# Patient Record
Sex: Female | Born: 1970 | Race: Black or African American | Hispanic: No | Smoking: Current every day smoker
Health system: Southern US, Community
[De-identification: ages and names within clinical notes are randomized; demographics above are authoritative.]

## PROBLEM LIST (undated history)

## (undated) ENCOUNTER — Emergency Department: Payer: Self-pay

## (undated) ENCOUNTER — Emergency Department (HOSPITAL_COMMUNITY): Payer: MEDICAID

## (undated) DIAGNOSIS — I10 Essential (primary) hypertension: Secondary | ICD-10-CM

## (undated) DIAGNOSIS — J45909 Unspecified asthma, uncomplicated: Secondary | ICD-10-CM

## (undated) DIAGNOSIS — F419 Anxiety disorder, unspecified: Secondary | ICD-10-CM

## (undated) DIAGNOSIS — J449 Chronic obstructive pulmonary disease, unspecified: Secondary | ICD-10-CM

## (undated) DIAGNOSIS — F32A Depression, unspecified: Secondary | ICD-10-CM

## (undated) DIAGNOSIS — M419 Scoliosis, unspecified: Secondary | ICD-10-CM

## (undated) HISTORY — PX: ECTOPIC PREGNANCY SURGERY: SHX613

## (undated) HISTORY — DX: Depression, unspecified: F32.A

## (undated) HISTORY — DX: Anxiety disorder, unspecified: F41.9

## (undated) HISTORY — DX: Essential (primary) hypertension: I10

## (undated) HISTORY — PX: BACK SURGERY: SHX140

---

## 1999-04-20 ENCOUNTER — Emergency Department (HOSPITAL_COMMUNITY): Admission: EM | Admit: 1999-04-20 | Discharge: 1999-04-20 | Payer: Self-pay | Admitting: Emergency Medicine

## 2000-10-06 ENCOUNTER — Inpatient Hospital Stay (HOSPITAL_COMMUNITY): Admission: EM | Admit: 2000-10-06 | Discharge: 2000-10-07 | Payer: Self-pay

## 2001-01-08 ENCOUNTER — Emergency Department (HOSPITAL_COMMUNITY): Admission: EM | Admit: 2001-01-08 | Discharge: 2001-01-08 | Payer: Self-pay | Admitting: Emergency Medicine

## 2001-01-08 ENCOUNTER — Encounter: Payer: Self-pay | Admitting: Emergency Medicine

## 2011-05-10 ENCOUNTER — Emergency Department (HOSPITAL_COMMUNITY): Payer: Medicaid - Out of State

## 2011-05-10 ENCOUNTER — Emergency Department (HOSPITAL_COMMUNITY)
Admission: EM | Admit: 2011-05-10 | Discharge: 2011-05-10 | Disposition: A | Discharge status: Home / Self Care | Payer: Medicaid - Out of State | Attending: Emergency Medicine | Admitting: Emergency Medicine

## 2011-05-10 ENCOUNTER — Encounter (HOSPITAL_COMMUNITY): Payer: Self-pay | Admitting: *Deleted

## 2011-05-10 DIAGNOSIS — M79609 Pain in unspecified limb: Secondary | ICD-10-CM | POA: Insufficient documentation

## 2011-05-10 DIAGNOSIS — F17200 Nicotine dependence, unspecified, uncomplicated: Secondary | ICD-10-CM | POA: Insufficient documentation

## 2011-05-10 DIAGNOSIS — M549 Dorsalgia, unspecified: Secondary | ICD-10-CM

## 2011-05-10 DIAGNOSIS — M545 Low back pain, unspecified: Secondary | ICD-10-CM | POA: Insufficient documentation

## 2011-05-10 DIAGNOSIS — R209 Unspecified disturbances of skin sensation: Secondary | ICD-10-CM | POA: Insufficient documentation

## 2011-05-10 HISTORY — DX: Scoliosis, unspecified: M41.9

## 2011-05-10 MED ORDER — IBUPROFEN 600 MG PO TABS: 600.0000 mg | ORAL_TABLET | Freq: Three times a day (TID) | ORAL | Status: AC | PRN

## 2011-05-10 MED ORDER — KETOROLAC TROMETHAMINE 60 MG/2ML IM SOLN
60.0000 mg | Freq: Once | INTRAMUSCULAR | Status: AC
  Administered 2011-05-10: 60 mg via INTRAMUSCULAR
  Filled 2011-05-10: qty 2

## 2011-05-10 MED ORDER — HYDROMORPHONE HCL PF 2 MG/ML IJ SOLN
2.0000 mg | Freq: Once | INTRAMUSCULAR | Status: AC
  Administered 2011-05-10: 2 mg via INTRAMUSCULAR
  Filled 2011-05-10: qty 1

## 2011-05-10 MED ORDER — OXYCODONE-ACETAMINOPHEN 5-325 MG PO TABS: 1.0000 | ORAL_TABLET | ORAL | Status: AC | PRN

## 2011-05-10 NOTE — ED Notes (Signed)
To ED for eval of lower back pain with radiation down right leg since falling when getting out of the shower on Friday. Ambulatory into triage.

## 2011-05-10 NOTE — Discharge Instructions (Signed)
Back Pain, Adult Low back pain is very common. About 1 in 5 people have back pain.The cause of low back pain is rarely dangerous. The pain often gets better over time.About half of people with a sudden onset of back pain feel better in just 2 weeks. About 8 in 10 people feel better by 6 weeks.  CAUSES Some common causes of back pain include:  Strain of the muscles or ligaments supporting the spine.   Wear and tear (degeneration) of the spinal discs.   Arthritis.   Direct injury to the back.  DIAGNOSIS Most of the time, the direct cause of low back pain is not known.However, back pain can be treated effectively even when the exact cause of the pain is unknown.Answering your caregiver's questions about your overall health and symptoms is one of the most accurate ways to make sure the cause of your pain is not dangerous. If your caregiver needs more information, he or she may order lab work or imaging tests (X-rays or MRIs).However, even if imaging tests show changes in your back, this usually does not require surgery. HOME CARE INSTRUCTIONS For many people, back pain returns.Since low back pain is rarely dangerous, it is often a condition that people can learn to manageon their own.   Remain active. It is stressful on the back to sit or stand in one place. Do not sit, drive, or stand in one place for more than 30 minutes at a time. Take short walks on level surfaces as soon as pain allows.Try to increase the length of time you walk each day.   Do not stay in bed.Resting more than 1 or 2 days can delay your recovery.   Do not avoid exercise or work.Your body is made to move.It is not dangerous to be active, even though your back may hurt.Your back will likely heal faster if you return to being active before your pain is gone.   Pay attention to your body when you bend and lift. Many people have less discomfortwhen lifting if they bend their knees, keep the load close to their  bodies,and avoid twisting. Often, the most comfortable positions are those that put less stress on your recovering back.   Find a comfortable position to sleep. Use a firm mattress and lie on your side with your knees slightly bent. If you lie on your back, put a pillow under your knees.   Only take over-the-counter or prescription medicines as directed by your caregiver. Over-the-counter medicines to reduce pain and inflammation are often the most helpful.Your caregiver may prescribe muscle relaxant drugs.These medicines help dull your pain so you can more quickly return to your normal activities and healthy exercise.   Put ice on the injured area.   Put ice in a plastic bag.   Place a towel between your skin and the bag.   Leave the ice on for 15 to 20 minutes, 3 to 4 times a day for the first 2 to 3 days. After that, ice and heat may be alternated to reduce pain and spasms.   Ask your caregiver about trying back exercises and gentle massage. This may be of some benefit.   Avoid feeling anxious or stressed.Stress increases muscle tension and can worsen back pain.It is important to recognize when you are anxious or stressed and learn ways to manage it.Exercise is a great option.  SEEK MEDICAL CARE IF:  You have pain that is not relieved with rest or medicine.   You have   pain that does not improve in 1 week.   You have new symptoms.   You are generally not feeling well.  SEEK IMMEDIATE MEDICAL CARE IF:   You have pain that radiates from your back into your legs.   You develop new bowel or bladder control problems.   You have unusual weakness or numbness in your arms or legs.   You develop nausea or vomiting.   You develop abdominal pain.   You feel faint.  Document Released: 01/04/2005 Document Revised: 12/24/2010 Document Reviewed: 05/25/2010 [employer] Patient Information 2012 [employer]. 

## 2011-05-10 NOTE — ED Provider Notes (Signed)
History     CSN: [id number]  Arrival date & time 05/10/11  1259   First MD Initiated Contact with Patient 05/10/11 1332      Chief Complaint  Patient presents with  . Back Pain     The history is provided by the patient.   the patient reports a history of ongoing low back pain.  This has worsened recently by a fall in the shower 3 days ago.  She reports she now has pain radiating down her right buttock.  She denies weakness of her lower extremities.  She's had no difficulty urinating or having bowel movements.  She denies peroneal numbness.  She reports a history of back surgery and fusion in the past.  She has a history of scoliosis.  She denies numbness and tingling.  She reports over the counter pain medications have not been helping.  Her pain is moderate to severe at this time.  Her pain is worsened by movement.  She is able to walk without significant difficulty.  She was ambulatory in the triage area  Past Medical History  Diagnosis Date  . Scoliosis     Past Surgical History  Procedure Date  . Back surgery     No family history on file.  History  Substance Use Topics  . Smoking status: Current Everyday Smoker    Types: Cigarettes  . Smokeless tobacco: Not on file  . Alcohol Use: Yes    OB History    Grav Para Term Preterm Abortions TAB SAB Ect Mult Living                  Review of Systems  All other systems reviewed and are negative.    Allergies  Review of patient's allergies indicates no known allergies.  Home Medications   Current Outpatient Rx  Name Route Sig Dispense Refill  . IBUPROFEN 600 MG PO TABS Oral Take 1 tablet (600 mg total) by mouth every 8 (eight) hours as needed for pain. 15 tablet 0  . OXYCODONE-ACETAMINOPHEN 5-325 MG PO TABS Oral Take 1 tablet by mouth every 4 (four) hours as needed for pain. 15 tablet 0    BP 136/103  Pulse 96  Temp 98.4 F (36.9 C)  Resp 16  SpO2 96%  LMP 04/21/2011  Physical Exam  Nursing note and  vitals reviewed. Constitutional: She is oriented to person, place, and time. She appears well-developed and well-nourished. No distress.  HENT:  Head: Normocephalic and atraumatic.  Eyes: EOM are normal.  Neck: Normal range of motion.  Cardiovascular: Normal rate, regular rhythm and normal heart sounds.   Pulmonary/Chest: Effort normal and breath sounds normal.  Abdominal: Soft. She exhibits no distension. There is no tenderness.  Musculoskeletal: Normal range of motion.  Neurological: She is alert and oriented to person, place, and time.       5/5 strength in major muscle groups of bilateral lower extremities.   Skin: Skin is warm and dry.  Psychiatric: She has a normal mood and affect. Judgment normal.    ED Course  Procedures (including critical care time)  Labs Reviewed - No data to display Dg Lumbar Spine Complete  05/10/2011  *RADIOLOGY REPORT*  Clinical Data: Low back pain and bilateral leg pain and numbness, right greater than left, since a fall 3 days ago.  LUMBAR SPINE - COMPLETE 4+ VIEW  Comparison: None.  Findings: The patient appears to have had posterior fusion from T9- 10 through L3-4.  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  The patient has severe arthritic changes of the facet joints at L4- 5 and L5 S1 and between the spinous processes of L4-L5.  There is a grade 1 spondylolisthesis of L5 on S1 with disc space narrowing.  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  No acute abnormality.  IMPRESSION: No acute abnormalities.  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  Original Report Authenticated By: Gwynn Burly, M.D.     1. Back pain       MDM  Normal lower extremity neurologic exam. No bowel or bladder complaints. No back pain red flags. Likely musculoskeletal back pain. Doubt spinal epidural abscess. Doubt cauda equina. Doubt abdominal aortic aneurysm  Follow up with the [facility] recommended given her pain. No  indication for MRI imaging today        Lyanne Co, MD 05/10/11 1525

## 2011-07-20 ENCOUNTER — Ambulatory Visit: Payer: No Typology Code available for payment source | Attending: Family Medicine | Admitting: Rehabilitation

## 2011-08-16 ENCOUNTER — Encounter (HOSPITAL_COMMUNITY): Payer: Self-pay | Admitting: Emergency Medicine

## 2011-08-16 ENCOUNTER — Emergency Department (HOSPITAL_COMMUNITY): Payer: Medicaid - Out of State

## 2011-08-16 ENCOUNTER — Emergency Department (HOSPITAL_COMMUNITY)
Admission: EM | Admit: 2011-08-16 | Discharge: 2011-08-16 | Disposition: A | Discharge status: Home / Self Care | Payer: Medicaid - Out of State | Attending: Emergency Medicine | Admitting: Emergency Medicine

## 2011-08-16 DIAGNOSIS — J40 Bronchitis, not specified as acute or chronic: Secondary | ICD-10-CM

## 2011-08-16 DIAGNOSIS — F17200 Nicotine dependence, unspecified, uncomplicated: Secondary | ICD-10-CM | POA: Insufficient documentation

## 2011-08-16 DIAGNOSIS — M549 Dorsalgia, unspecified: Secondary | ICD-10-CM

## 2011-08-16 MED ORDER — OXYCODONE-ACETAMINOPHEN 5-325 MG PO TABS
1.0000 | ORAL_TABLET | Freq: Once | ORAL | Status: AC
  Administered 2011-08-16: 1 via ORAL
  Filled 2011-08-16 (×2): qty 1

## 2011-08-16 MED ORDER — AMOXICILLIN 500 MG PO CAPS: 500.0000 mg | ORAL_CAPSULE | Freq: Three times a day (TID) | ORAL | Status: AC

## 2011-08-16 MED ORDER — OXYCODONE-ACETAMINOPHEN 5-325 MG PO TABS: 1.0000 | ORAL_TABLET | Freq: Four times a day (QID) | ORAL | Status: AC | PRN

## 2011-08-16 NOTE — ED Provider Notes (Cosign Needed)
History   This chart was scribed for Rachel Lennert, MD by Sofie Rower. The patient was seen in room TR05C/TR05C and the patient's care was started at 5:13 PM     CSN: [id number]  Arrival date & time 08/16/11  1525   None     Chief Complaint  Patient presents with  . Back Pain    (Consider location/radiation/quality/duration/timing/severity/associated sxs/prior treatment) Patient is a 41 y.o. female presenting with back pain. The history is provided by the patient. No language interpreter was used.  Back Pain  This is a new problem. The problem occurs constantly. The pain is moderate. The symptoms are aggravated by certain positions. The pain is the same all the time. Pertinent negatives include no chest pain and no fever. She has tried nothing for the symptoms. The treatment provided no relief.    Past Medical History  Diagnosis Date  . Scoliosis     Past Surgical History  Procedure Date  . Back surgery     No family history on file.  History  Substance Use Topics  . Smoking status: Current Everyday Smoker    Types: Cigarettes  . Smokeless tobacco: Not on file  . Alcohol Use: Yes    OB History    Grav Para Term Preterm Abortions TAB SAB Ect Mult Living                  Review of Systems  Constitutional: Negative for fever.  Cardiovascular: Negative for chest pain.  Musculoskeletal: Positive for back pain.  All other systems reviewed and are negative.    Allergies  Review of patient's allergies indicates no known allergies.  Home Medications   Current Outpatient Rx  Name Route Sig Dispense Refill  . ACETAMINOPHEN 500 MG PO TABS Oral Take 1,000 mg by mouth every 4 (four) hours as needed. For back pain      BP 142/103  Pulse 79  Temp 98.8 F (37.1 C)  Resp 16  SpO2 100%  LMP 08/06/2011  Physical Exam  Nursing note and vitals reviewed. Constitutional: She is oriented to person, place, and time. She appears well-developed.  HENT:  Head:  Normocephalic.  Eyes: Conjunctivae are normal.  Neck: No tracheal deviation present.  Cardiovascular:  No murmur heard. Musculoskeletal: Normal range of motion.       Well healed incision to the thoracic/ lumbar spine, lumbar tenderness, pain radiating down the right leg.   Neurological: She is oriented to person, place, and time.  Skin: Skin is warm.  Psychiatric: She has a normal mood and affect.    ED Course  Procedures (including critical care time)  DIAGNOSTIC STUDIES: Oxygen Saturation is 100% on room air, normal by my interpretation.    COORDINATION OF CARE:  5:16PM- EDP at bedside discusses treatment plan concerning x-ray of back.   6:14PM- EDP at bedside discusses x-ray results and follow up with PCP.     Labs Reviewed - No data to display No results found for this or any previous visit. Dg Chest 2 View  08/16/2011  *RADIOLOGY REPORT*  Clinical Data: Back pain, history of asthma and smoking  CHEST - 2 VIEW  Comparison: Lumbar spine radiographs - 05/10/2011  Findings:  Normal cardiac silhouette and mediastinal contours.  No focal parenchymal opacities.  No definite pleural effusion or pneumothorax. Right-sided long-segment thoraco-lumbar paraspinal rod with associated mild to moderate residual scoliotic curvature, incompletely evaluated.  IMPRESSION: No acute cardiopulmonary disease.  Original Report Authenticated By: Alfredia Ferguson  V, M.D.   Dg Lumbar Spine Complete  08/16/2011  *RADIOLOGY REPORT*  Clinical Data: Back pain, no trauma, history of scoliosis  LUMBAR SPINE - COMPLETE 4+ VIEW  Comparison: 05/10/2011  Findings: Five views of the lumbar spine submitted.  Again noted prior fusion from T9- L4 vertebral body.  Again noted rotatory scoliosis of the spine.  Again noted disc space flattening at L5 S1 level.  Significant facet degenerative changes at L4-L5 and L5 S1 level again noted.  There is persistent grade 1 spondylolisthesis L5 on S1.  No acute fracture or  subluxation.  IMPRESSION: Stable postsurgical changes and degenerative changes as described above.  No acute fracture or subluxation.  Original Report Authenticated By: Natasha Mead, M.D.        No diagnosis found.    MDM       The chart was scribed for me under my direct supervision.  I personally performed the history, physical, and medical decision making and all procedures in the evaluation of this patient.Rachel Lennert, MD 08/16/11 [phone]

## 2011-08-16 NOTE — ED Notes (Signed)
Woke up w/ back pain this am and has had a bad cold for  aince last sat

## 2011-08-16 NOTE — ED Notes (Signed)
staes walks w/ walker due past hx of surgery on backl

## 2011-09-15 ENCOUNTER — Emergency Department (HOSPITAL_COMMUNITY)
Admission: EM | Admit: 2011-09-15 | Discharge: 2011-09-15 | Disposition: A | Discharge status: Home / Self Care | Payer: Self-pay | Attending: Emergency Medicine | Admitting: Emergency Medicine

## 2011-09-15 ENCOUNTER — Encounter (HOSPITAL_COMMUNITY): Payer: Self-pay | Admitting: Emergency Medicine

## 2011-09-15 ENCOUNTER — Emergency Department (HOSPITAL_COMMUNITY): Payer: Self-pay

## 2011-09-15 DIAGNOSIS — G8929 Other chronic pain: Secondary | ICD-10-CM

## 2011-09-15 DIAGNOSIS — M549 Dorsalgia, unspecified: Secondary | ICD-10-CM

## 2011-09-15 DIAGNOSIS — IMO0002 Reserved for concepts with insufficient information to code with codable children: Secondary | ICD-10-CM | POA: Insufficient documentation

## 2011-09-15 DIAGNOSIS — F17200 Nicotine dependence, unspecified, uncomplicated: Secondary | ICD-10-CM | POA: Insufficient documentation

## 2011-09-15 DIAGNOSIS — M4120 Other idiopathic scoliosis, site unspecified: Secondary | ICD-10-CM | POA: Insufficient documentation

## 2011-09-15 DIAGNOSIS — W2209XA Striking against other stationary object, initial encounter: Secondary | ICD-10-CM | POA: Insufficient documentation

## 2011-09-15 DIAGNOSIS — Y92009 Unspecified place in unspecified non-institutional (private) residence as the place of occurrence of the external cause: Secondary | ICD-10-CM | POA: Insufficient documentation

## 2011-09-15 MED ORDER — NAPROXEN 375 MG PO TABS: 375.0000 mg | ORAL_TABLET | Freq: Two times a day (BID) | ORAL | Status: AC

## 2011-09-15 MED ORDER — CYCLOBENZAPRINE HCL 10 MG PO TABS: 10.0000 mg | ORAL_TABLET | Freq: Two times a day (BID) | ORAL | Status: AC | PRN

## 2011-09-15 MED ORDER — KETOROLAC TROMETHAMINE 60 MG/2ML IM SOLN
60.0000 mg | Freq: Once | INTRAMUSCULAR | Status: AC
  Administered 2011-09-15: 60 mg via INTRAMUSCULAR
  Filled 2011-09-15: qty 2

## 2011-09-15 MED ORDER — NAPROXEN 375 MG PO TABS: 375.0000 mg | ORAL_TABLET | Freq: Two times a day (BID) | ORAL | Status: DC

## 2011-09-15 MED ORDER — CYCLOBENZAPRINE HCL 10 MG PO TABS: 10.0000 mg | ORAL_TABLET | Freq: Two times a day (BID) | ORAL | Status: DC | PRN

## 2011-09-15 NOTE — ED Notes (Signed)
Was in br slipped on rug went down and hit  Her back on tub has  Spine surgery she staes

## 2011-09-15 NOTE — ED Notes (Signed)
Patient transported to X-ray 

## 2011-09-15 NOTE — ED Provider Notes (Signed)
History     CSN: [id number]  Arrival date & time 09/15/11  0806   First MD Initiated Contact with Patient 09/15/11 0818      Chief Complaint  Patient presents with  . Back Pain    (Consider location/radiation/quality/duration/timing/severity/associated sxs/prior treatment) HPI  41 year old female with history of scoliosis and history of back surgery presents complaining of back pain. Patient reports she was in the bathroom when she slipped on a rug and hits her back against the tub.  Denies hitting her head or loss of consciousness. Patient complaining of sharp pain to her low back which radiates down to her right head. She reports having similar pain in the past. Pain worsened with movement. She has not tried anything to alleviate her pain.  Denies urinary/bowel incontinence or saddle anesthesia.  Sts she uses a walker to walk on a regular basis due to having weakness of her legs and back pain.  Denies any precipitating sxs prior to fall.   Past Medical History  Diagnosis Date  . Scoliosis     Past Surgical History  Procedure Date  . Back surgery     No family history on file.  History  Substance Use Topics  . Smoking status: Current Everyday Smoker    Types: Cigarettes  . Smokeless tobacco: Not on file  . Alcohol Use: Yes    OB History    Grav Para Term Preterm Abortions TAB SAB Ect Mult Living                  Review of Systems  All other systems reviewed and are negative.    Allergies  Review of patient's allergies indicates no known allergies.  Home Medications   Current Outpatient Rx  Name Route Sig Dispense Refill  . ACETAMINOPHEN 500 MG PO TABS Oral Take 1,000 mg by mouth every 4 (four) hours as needed. For back pain      BP 141/115  Pulse 105  Temp 98.2 F (36.8 C) (Oral)  Resp 20  SpO2 98%  Physical Exam  Nursing note and vitals reviewed. Constitutional: She is oriented to person, place, and time. She appears well-developed and  well-nourished. No distress.  HENT:  Head: Normocephalic and atraumatic.  Eyes: Conjunctivae are normal.  Neck: Normal range of motion. Neck supple.  Abdominal: Soft. There is no tenderness.  Musculoskeletal:       Well healing surgical to Lumbar region, ttp, no step off, no deformity noted.  Decrease R hip flexion and extension due to pain.  Normal knee flexion, patella DTR 2+ bilat, no foot drops, sensation intact, normal strength to lower extremities.  Walk with a limp.   Neurological: She is alert and oriented to person, place, and time.  Skin: Skin is warm. No rash noted.  Psychiatric: She has a normal mood and affect.    ED Course  Procedures (including critical care time)  Labs Reviewed - No data to display No results found.   No diagnosis found.  No results found for this or any previous visit. Dg Chest 2 View  08/16/2011  *RADIOLOGY REPORT*  Clinical Data: Back pain, history of asthma and smoking  CHEST - 2 VIEW  Comparison: Lumbar spine radiographs - 05/10/2011  Findings:  Normal cardiac silhouette and mediastinal contours.  No focal parenchymal opacities.  No definite pleural effusion or pneumothorax. Right-sided long-segment thoraco-lumbar paraspinal rod with associated mild to moderate residual scoliotic curvature, incompletely evaluated.  IMPRESSION: No acute cardiopulmonary disease.  Original Report  Authenticated By: Waynard Reeds, M.D.   Dg Lumbar Spine Complete  09/15/2011  *RADIOLOGY REPORT*  Clinical Data: Fall, back pain.  Scoliosis  LUMBAR SPINE - COMPLETE 4+ VIEW  Comparison: Plain films 08/16/2011  Findings: Spinal fusion rod is noted and unchanged.  Rotatory scoliosis is similar.  There is no acute loss of vertebral body height or disc height.  Disc space widening and L4-L5 is unchanged. Disc space narrowing and mild into stasis and L5-S1 is unchanged. 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  IMPRESSION:  1.  No acute findings lumbar spine. 2.  Posterior spinal fusion  with rotatory scoliosis and degenerative change.   Original Report Authenticated By: Genevive Bi, M.D.    Dg Lumbar Spine Complete  08/16/2011  *RADIOLOGY REPORT*  Clinical Data: Back pain, no trauma, history of scoliosis  LUMBAR SPINE - COMPLETE 4+ VIEW  Comparison: 05/10/2011  Findings: Five views of the lumbar spine submitted.  Again noted prior fusion from T9- L4 vertebral body.  Again noted rotatory scoliosis of the spine.  Again noted disc space flattening at L5 S1 level.  Significant facet degenerative changes at L4-L5 and L5 S1 level again noted.  There is persistent grade 1 spondylolisthesis L5 on S1.  No acute fracture or subluxation.  IMPRESSION: Stable postsurgical changes and degenerative changes as described above.  No acute fracture or subluxation.  Original Report Authenticated By: Natasha Mead, M.D.    1. Lower back injury from fall  MDM  Mechanical fall with injury to lower back.  Xray ordered, pain mediation given. No red flags.      10:09 AM X-rays review by me reveals no acute fractures or dislocation. Reassurance given. Will refer to orthopedics for further management. Patient able to ambulate, has walker at home.   Vital signs and medical records were reviewed and considered.  Labs and imaging were reviewed by me.    Fayrene Helper, PA-C 09/15/11 1022  Fayrene Helper, PA-C 09/15/11 1022

## 2011-09-19 NOTE — ED Provider Notes (Signed)
Medical screening examination/treatment/procedure(s) were performed by non-physician practitioner and as supervising physician I was immediately available for consultation/collaboration.  Cheri Guppy, MD 09/19/11 [phone]

## 2014-05-23 ENCOUNTER — Emergency Department (HOSPITAL_COMMUNITY): Payer: Medicaid - Out of State

## 2014-05-23 ENCOUNTER — Emergency Department (HOSPITAL_COMMUNITY)
Admission: EM | Admit: 2014-05-23 | Discharge: 2014-05-23 | Disposition: A | Discharge status: Home / Self Care | Payer: Medicaid - Out of State | Attending: Emergency Medicine | Admitting: Emergency Medicine

## 2014-05-23 ENCOUNTER — Encounter (HOSPITAL_COMMUNITY): Payer: Self-pay | Admitting: Emergency Medicine

## 2014-05-23 DIAGNOSIS — R5383 Other fatigue: Secondary | ICD-10-CM | POA: Insufficient documentation

## 2014-05-23 DIAGNOSIS — M79601 Pain in right arm: Secondary | ICD-10-CM | POA: Insufficient documentation

## 2014-05-23 DIAGNOSIS — R200 Anesthesia of skin: Secondary | ICD-10-CM | POA: Insufficient documentation

## 2014-05-23 DIAGNOSIS — J45901 Unspecified asthma with (acute) exacerbation: Secondary | ICD-10-CM | POA: Insufficient documentation

## 2014-05-23 DIAGNOSIS — M25511 Pain in right shoulder: Secondary | ICD-10-CM | POA: Insufficient documentation

## 2014-05-23 DIAGNOSIS — M62838 Other muscle spasm: Secondary | ICD-10-CM | POA: Insufficient documentation

## 2014-05-23 DIAGNOSIS — Z720 Tobacco use: Secondary | ICD-10-CM | POA: Insufficient documentation

## 2014-05-23 DIAGNOSIS — Z79899 Other long term (current) drug therapy: Secondary | ICD-10-CM | POA: Insufficient documentation

## 2014-05-23 DIAGNOSIS — M542 Cervicalgia: Secondary | ICD-10-CM | POA: Insufficient documentation

## 2014-05-23 HISTORY — DX: Unspecified asthma, uncomplicated: J45.909

## 2014-05-23 LAB — I-STAT CHEM 8, ED
BUN: 12 mg/dL (ref 6–20)
Calcium, Ion: 1.26 mmol/L — ABNORMAL HIGH (ref 1.12–1.23)
Chloride: 100 mmol/L — ABNORMAL LOW (ref 101–111)
Creatinine, Ser: 0.8 mg/dL (ref 0.44–1.00)
Glucose, Bld: 100 mg/dL — ABNORMAL HIGH (ref 70–99)
HCT: 37 % (ref 36.0–46.0)
Hemoglobin: 12.6 g/dL (ref 12.0–15.0)
Potassium: 3.7 mmol/L (ref 3.5–5.1)
Sodium: 140 mmol/L (ref 135–145)
TCO2: 24 mmol/L (ref 0–100)

## 2014-05-23 MED ORDER — ALBUTEROL SULFATE HFA 108 (90 BASE) MCG/ACT IN AERS
1.0000 | INHALATION_SPRAY | Freq: Four times a day (QID) | RESPIRATORY_TRACT | Status: DC | PRN
Start: 2014-05-23 — End: 2014-05-23
  Administered 2014-05-23: 2 via RESPIRATORY_TRACT
  Filled 2014-05-23: qty 6.7

## 2014-05-23 MED ORDER — PREDNISONE 20 MG PO TABS: 20.0000 mg | ORAL_TABLET | Freq: Two times a day (BID) | ORAL | Status: DC

## 2014-05-23 MED ORDER — IPRATROPIUM-ALBUTEROL 0.5-2.5 (3) MG/3ML IN SOLN: 3.0000 mL | Freq: Once | RESPIRATORY_TRACT | Status: DC

## 2014-05-23 MED ORDER — IPRATROPIUM-ALBUTEROL 0.5-2.5 (3) MG/3ML IN SOLN
3.0000 mL | Freq: Once | RESPIRATORY_TRACT | Status: AC
  Administered 2014-05-23: 3 mL via RESPIRATORY_TRACT
  Filled 2014-05-23: qty 3

## 2014-05-23 MED ORDER — PREDNISONE 20 MG PO TABS
60.0000 mg | ORAL_TABLET | Freq: Once | ORAL | Status: AC
  Administered 2014-05-23: 60 mg via ORAL
  Filled 2014-05-23: qty 3

## 2014-05-23 MED ORDER — PREDNISONE 20 MG PO TABS: 60.0000 mg | ORAL_TABLET | Freq: Once | ORAL | Status: DC

## 2014-05-23 NOTE — Discharge Instructions (Signed)
Asthma Attack Prevention Although there is no way to prevent asthma from starting, you can take steps to control the disease and reduce its symptoms. Learn about your asthma and how to control it. Take an active role to control your asthma by working with your health care provider to create and follow an asthma action plan. An asthma action plan guides you in:  Taking your medicines properly.  Avoiding things that set off your asthma or make your asthma worse (asthma triggers).  Tracking your level of asthma control.  Responding to worsening asthma.  Seeking emergency care when needed. To track your asthma, keep records of your symptoms, check your peak flow number using a handheld device that shows how well air moves out of your lungs (peak flow meter), and get regular asthma checkups.  WHAT ARE SOME WAYS TO PREVENT AN ASTHMA ATTACK?  Take medicines as directed by your health care provider.  Keep track of your asthma symptoms and level of control.  With your health care provider, write a detailed plan for taking medicines and managing an asthma attack. Then be sure to follow your action plan. Asthma is an ongoing condition that needs regular monitoring and treatment.  Identify and avoid asthma triggers. Many outdoor allergens and irritants (such as pollen, mold, cold air, and air pollution) can trigger asthma attacks. Find out what your asthma triggers are and take steps to avoid them.  Monitor your breathing. Learn to recognize warning signs of an attack, such as coughing, wheezing, or shortness of breath. Your lung function may decrease before you notice any signs or symptoms, so regularly measure and record your peak airflow with a home peak flow meter.  Identify and treat attacks early. If you act quickly, you are less likely to have a severe attack. You will also need less medicine to control your symptoms. When your peak flow measurements decrease and alert you to an upcoming attack,  take your medicine as instructed and immediately stop any activity that may have triggered the attack. If your symptoms do not improve, get medical help.  Pay attention to increasing quick-relief inhaler use. If you find yourself relying on your quick-relief inhaler, your asthma is not under control. See your health care provider about adjusting your treatment. WHAT CAN MAKE MY SYMPTOMS WORSE? A number of common things can set off or make your asthma symptoms worse and cause temporary increased inflammation of your airways. Keep track of your asthma symptoms for several weeks, detailing all the environmental and emotional factors that are linked with your asthma. When you have an asthma attack, go back to your asthma diary to see which factor, or combination of factors, might have contributed to it. Once you know what these factors are, you can take steps to control many of them. If you have allergies and asthma, it is important to take asthma prevention steps at home. Minimizing contact with the substance to which you are allergic will help prevent an asthma attack. Some triggers and ways to avoid these triggers are: Animal Dander:  Some people are allergic to the flakes of skin or dried saliva from animals with fur or feathers.   There is no such thing as a hypoallergenic dog or cat breed. All dogs or cats can cause allergies, even if they don't shed.  Keep these pets out of your home.  If you are not able to keep a pet outdoors, keep the pet out of your bedroom and other sleeping areas at all  times, and keep the door closed. °· Remove carpets and furniture covered with cloth from your home. If that is not possible, keep the pet away from fabric-covered furniture and carpets. °Dust Mites: °Many people with asthma are allergic to dust mites. Dust mites are tiny bugs that are found in every home in mattresses, pillows, carpets, fabric-covered furniture, bedcovers, clothes, stuffed toys, and other  fabric-covered items.  °· Cover your mattress in a special dust-proof cover. °· Cover your pillow in a special dust-proof cover, or wash the pillow each week in hot water. Water must be hotter than 130° F (54.4° C) to kill dust mites. Cold or warm water used with detergent and bleach can also be effective. °· Wash the sheets and blankets on your bed each week in hot water. °· Try not to sleep or lie on cloth-covered cushions. °· Call ahead when traveling and ask for a smoke-free hotel room. Bring your own bedding and pillows in case the hotel only supplies feather pillows and down comforters, which may contain dust mites and cause asthma symptoms. °· Remove carpets from your bedroom and those laid on concrete, if you can. °· Keep stuffed toys out of the bed, or wash the toys weekly in hot water or cooler water with detergent and bleach. °Cockroaches: °Many people with asthma are allergic to the droppings and remains of cockroaches.  °· Keep food and garbage in closed containers. Never leave food out. °· Use poison baits, traps, powders, gels, or paste (for example, boric acid). °· If a spray is used to kill cockroaches, stay out of the room until the odor goes away. °Indoor Mold: °· Fix leaky faucets, pipes, or other sources of water that have mold around them. °· Clean floors and moldy surfaces with a fungicide or diluted bleach. °· Avoid using humidifiers, vaporizers, or swamp coolers. These can spread molds through the air. °Pollen and Outdoor Mold: °· When pollen or mold spore counts are high, try to keep your windows closed. °· Stay indoors with windows closed from late morning to afternoon. Pollen and some mold spore counts are highest at that time. °· Ask your health care provider whether you need to take anti-inflammatory medicine or increase your dose of the medicine before your allergy season starts. °Other Irritants to Avoid: °· Tobacco smoke is an irritant. If you smoke, ask your health care provider how  you can quit. Ask family members to quit smoking, too. Do not allow smoking in your home or car. °· If possible, do not use a wood-burning stove, kerosene heater, or fireplace. Minimize exposure to all sources of smoke, including incense, candles, fires, and fireworks. °· Try to stay away from strong odors and sprays, such as perfume, talcum powder, hair spray, and paints. °· Decrease humidity in your home and use an indoor air cleaning device. Reduce indoor humidity to below 60%. Dehumidifiers or central air conditioners can do this. °· Decrease house dust exposure by changing furnace and air cooler filters frequently. °· Try to have someone else vacuum for you once or twice a week. Stay out of rooms while they are being vacuumed and for a short while afterward. °· If you vacuum, use a dust mask from a hardware store, a double-layered or microfilter vacuum cleaner bag, or a vacuum cleaner with a HEPA filter. °· Sulfites in foods and beverages can be irritants. Do not drink beer or wine or eat dried fruit, processed potatoes, or shrimp if they cause asthma symptoms. °· Cold   air can trigger an asthma attack. Cover your nose and mouth with a scarf on cold or windy days.  Several health conditions can make asthma more difficult to manage, including a runny nose, sinus infections, reflux disease, psychological stress, and sleep apnea. Work with your health care provider to manage these conditions.  Avoid close contact with people who have a respiratory infection such as a cold or the flu, since your asthma symptoms may get worse if you catch the infection. Wash your hands thoroughly after touching items that may have been handled by people with a respiratory infection.  Get a flu shot every year to protect against the flu virus, which often makes asthma worse for days or weeks. Also get a pneumonia shot if you have not previously had one. Unlike the flu shot, the pneumonia shot does not need to be given  yearly. Medicines:  Talk to your health care provider about whether it is safe for you to take aspirin or non-steroidal anti-inflammatory medicines (NSAIDs). In a small number of people with asthma, aspirin and NSAIDs can cause asthma attacks. These medicines must be avoided by people who have known aspirin-sensitive asthma. It is important that people with aspirin-sensitive asthma read labels of all over-the-counter medicines used to treat pain, colds, coughs, and fever.  Beta-blockers and ACE inhibitors are other medicines you should discuss with your health care provider. HOW CAN I FIND OUT WHAT I AM ALLERGIC TO? Ask your asthma health care provider about allergy skin testing or blood testing (the RAST test) to identify the allergens to which you are sensitive. If you are found to have allergies, the most important thing to do is to try to avoid exposure to any allergens that you are sensitive to as much as possible. Other treatments for allergies, such as medicines and allergy shots (immunotherapy) are available.  CAN I EXERCISE? Follow your health care provider's advice regarding asthma treatment before exercising. It is important to maintain a regular exercise program, but vigorous exercise or exercise in cold, humid, or dry environments can cause asthma attacks, especially for those people who have exercise-induced asthma. Document Released: 12/23/2008 Document Revised: 01/09/2013 Document Reviewed: 07/12/2012 [facility] Patient Information 2015 [employer] [place]. This information is not intended to replace advice given to you by your health care provider. Make sure you discuss any questions you have with your health care provider.  Please monitor for new or worsening signs or symptoms, seek immediate medical care if any present. [facility] for further evaluation and management of your chronic asthma symptoms.

## 2014-05-23 NOTE — ED Notes (Signed)
Respiratory at bedside for evaluation and treatment.

## 2014-05-23 NOTE — ED Provider Notes (Signed)
CSN: [id number]     Arrival date & time 05/23/14  1358 History  This chart was scribed for non-physician practitioner, Eyvonne MechanicJeffrey Jahred Tatar, working with Benjiman CoreNathan Pickering, MD by Richarda Overlieichard Holland, ED Scribe. This patient was seen in room TR06C/TR06C and the patient's care was started at 2:34 PM.      Chief Complaint  Patient presents with  . Asthma   The history is provided by the patient. No language interpreter was used.   HPI Comments: Rachel Nolan is a 44 y.o. female with a history of scoliosis and asthma who presents to the Emergency Department complaining of wheezing that started 1.5 hours ago. She says that she was standing in line waiting to give plasma earlier and began wheezing so she left. Pt reports that she was not experiencing any wheezing this morning when she woke up. She reports no known asthma triggers and reports that her asthma will flare up at rest or with activity. Pt states she has not used her asthma inhaler for 7 months because she has not needed it.  Pt states that she is sometimes SOB after walking but says that it normal for her. She states that she feels weak and tired currently. She denies cough or any other cold symptoms. Pt reports a hx of smoking since 44 yo.   Pt states that she experienced sharp, upper CP pain last night that resolved after she changed positions in bed. Pt reports that her right arm experienced numbness from her shoulder to her right fingertips last night as well, not associated with chest pain. She complains of right arm and shoulder pain with spasm of right trapezius currently. Pt reports today she has no CP at this time.   Pt reports she was seen at the plasma center on 5/1 and due to a low hematocrit for further evluation. Due to patient's baseline fatigue.   Past Medical History  Diagnosis Date  . Scoliosis   . Asthma    Past Surgical History  Procedure Laterality Date  . Back surgery     History reviewed. No pertinent family  history. History  Substance Use Topics  . Smoking status: Current Every Day Smoker    Types: Cigarettes  . Smokeless tobacco: Not on file  . Alcohol Use: Yes   OB History    No data available     Review of Systems  Respiratory: Positive for wheezing. Negative for cough.   Cardiovascular: Negative for leg swelling.  Gastrointestinal: Negative for abdominal pain.  Musculoskeletal: Positive for myalgias and arthralgias.  Neurological: Positive for weakness.  All other systems reviewed and are negative.  Allergies  Review of patient's allergies indicates no known allergies.  Home Medications   Prior to Admission medications   Medication Sig Start Date End Date Taking? Authorizing Provider  albuterol (PROVENTIL HFA;VENTOLIN HFA) 108 (90 BASE) MCG/ACT inhaler Inhale 2 puffs into the lungs 4 (four) times daily. For shortness of breath    Historical Provider, MD   BP 131/91 mmHg  Pulse 85  Temp(Src) 98.3 F (36.8 C) (Oral)  Resp 22  SpO2 100%  LMP 05/08/2014   Physical Exam  Constitutional: She is oriented to person, place, and time. She appears well-developed and well-nourished.  HENT:  Head: Normocephalic and atraumatic.  Eyes: Pupils are equal, round, and reactive to light.  Neck: Normal range of motion. Neck supple. No JVD present. No tracheal deviation present. No thyromegaly present.  Cardiovascular: Normal rate, regular rhythm, normal heart sounds and intact distal pulses.  Exam reveals no gallop and no friction rub.   No murmur heard. Pulmonary/Chest: Effort normal. No stridor. No respiratory distress. She has wheezes. She has no rales. She exhibits no tenderness.  Audibly wheezing. Wheezing in upper right lung field, expiratory wheezing. Diminished lung sounds bilateral lower.   Abdominal: She exhibits no distension.  Musculoskeletal: She exhibits tenderness.  Vertical incision scar from thoracic spine to lumbar. No swelling or edema in lower extremities, equal in  sides bilaterally. Neck and shoulder pain with radiation of a sharp sensation down her arm only with palpation and flexion of the shoulder. TTP of right paraspinal. C-spine non-tender to palpation. Muscle spasm to right trapezius.   Lymphadenopathy:    She has no cervical adenopathy.  Neurological: She is alert and oriented to person, place, and time. Coordination normal.  Skin: Skin is warm and dry.  Psychiatric: She has a normal mood and affect. Her behavior is normal. Judgment and thought content normal.  Nursing note and vitals reviewed.   ED Course  Procedures  DIAGNOSTIC STUDIES: Oxygen Saturation is 100% on RA, normal by my interpretation.    COORDINATION OF CARE: 2:49 PM Discussed treatment plan with pt at bedside and pt agreed to plan.   Labs Review Labs Reviewed - No data to display  Imaging Review Dg Chest 2 View  05/23/2014   CLINICAL DATA:  Shortness of breath and wheezing  EXAM: CHEST  2 VIEW  COMPARISON:  August 16, 2011  FINDINGS: There is subsegmental atelectasis in the left lower lobe and lateral right base regions. Lungs elsewhere are clear. Heart size and pulmonary vascularity are normal. No adenopathy. There is lower thoracic and upper lumbar levoscoliosis with rod fixation.  IMPRESSION: Subsegmental atelectasis in the lateral right base as well as in the left lower lobe. No edema or consolidation. No change in cardiac silhouette.   Electronically Signed   By: Bretta BangWilliam  Woodruff III M.D.   On: 05/23/2014 14:33     EKG Interpretation None      MDM   Final diagnoses:  Asthma exacerbation   Labs: I-STAT Chem-8- no significant findings CO2 24 hemoglobin 12.6  Imaging: Chest x-ray subsegmental atelectasis no signs of edema or consolidation, no change in cardiac silhouette  Consults: None indicated  Therapeutics: DuoNeb 2 prednisone 60 mg  Assessment: Asthma exacerbation  Plan: She presents with an asthmas exacerbation. Patient reports a history of asthma but  has not been taking medication for the last 7 months that she hasn't needed it. No known cause for the exacerbation was found patient denies fever, chills, upper respiratory symptoms, exposure to dust or pollen. She denies history of allergies. Patient presented with audible wheezes, she appeared nontoxic. Patient was given prednisone 60 mg, DuoNeb with a reevaluation of her lung sounds. At that time she had improved sounds with still wheezing on the upper right and left lung fields. Patient was given another DuoNeb, reassessment showed marked improvement in her wheezing, and respiratory effort. Patient's complaining of a baseline fatigue that has been persistent for the last few weeks, she had attempted to donate plasma and was found to have a low hematocrit, they acquired her to follow-up for evaluation before she was allowed to donate plasma again. I-STAT Chem-8 showed a normal hemoglobin and hematocrit, CO2 normal. Patient was visibly improved after treatment, labs showing no acute findings. Patient was discharged home with an albuterol inhaler, prescription for prednisone, instructed to follow up closely with [facility] wellness for further evaluation and  management of her ongoing asthma. She was given strict return precautions and the event that her wheezing returned, or she experienced new or worsening signs or symptoms. Patient verbalized her understanding of today's plan, and assured her follow-up evaluation.   I personally performed the services described in this documentation, which was scribed in my presence. The recorded information has been reviewed and is accurate.     Eyvonne Mechanic, PA-C 05/23/14 1925  Benjiman Core, MD 05/24/14 [phone]

## 2014-05-23 NOTE — ED Notes (Signed)
Called respiratory for checking peak flow and duoneb.   Tresa EndoKelly, RT is on her way.

## 2014-05-23 NOTE — ED Notes (Signed)
Pt stable, ambulatory, denies any pain, states understanding of discharge instructions 

## 2014-05-23 NOTE — ED Notes (Signed)
Pt arrives via POV from home. Was about to give plasma, began wheezing so she left. Pt with upper airway expiratory wheezes. Pt awake, alert, oriented x4. VSS.

## 2015-09-02 ENCOUNTER — Emergency Department (HOSPITAL_COMMUNITY): Payer: Self-pay

## 2015-09-02 ENCOUNTER — Encounter (HOSPITAL_COMMUNITY): Payer: Self-pay

## 2015-09-02 ENCOUNTER — Emergency Department (HOSPITAL_COMMUNITY)
Admission: EM | Admit: 2015-09-02 | Discharge: 2015-09-02 | Disposition: A | Discharge status: Home / Self Care | Payer: Self-pay | Attending: Emergency Medicine | Admitting: Emergency Medicine

## 2015-09-02 DIAGNOSIS — H5461 Unqualified visual loss, right eye, normal vision left eye: Secondary | ICD-10-CM

## 2015-09-02 DIAGNOSIS — J45901 Unspecified asthma with (acute) exacerbation: Secondary | ICD-10-CM | POA: Insufficient documentation

## 2015-09-02 DIAGNOSIS — R51 Headache: Secondary | ICD-10-CM | POA: Insufficient documentation

## 2015-09-02 DIAGNOSIS — H5460 Unqualified visual loss, one eye, unspecified: Secondary | ICD-10-CM | POA: Insufficient documentation

## 2015-09-02 DIAGNOSIS — F17210 Nicotine dependence, cigarettes, uncomplicated: Secondary | ICD-10-CM | POA: Insufficient documentation

## 2015-09-02 LAB — SEDIMENTATION RATE: Sed Rate: 17 mm/hr (ref 0–22)

## 2015-09-02 MED ORDER — MORPHINE SULFATE (PF) 4 MG/ML IV SOLN
2.0000 mg | Freq: Once | INTRAVENOUS | Status: AC
  Administered 2015-09-02: 2 mg via INTRAVENOUS
  Filled 2015-09-02: qty 1

## 2015-09-02 MED ORDER — ALBUTEROL SULFATE (2.5 MG/3ML) 0.083% IN NEBU
10.0000 mg | INHALATION_SOLUTION | Freq: Once | RESPIRATORY_TRACT | Status: AC
Start: 2015-09-02 — End: 2015-09-02
  Administered 2015-09-02: 10 mg via RESPIRATORY_TRACT

## 2015-09-02 MED ORDER — ALBUTEROL SULFATE (2.5 MG/3ML) 0.083% IN NEBU
5.0000 mg | INHALATION_SOLUTION | Freq: Once | RESPIRATORY_TRACT | Status: AC
  Administered 2015-09-02: 5 mg via RESPIRATORY_TRACT
  Filled 2015-09-02: qty 6

## 2015-09-02 MED ORDER — ALBUTEROL SULFATE HFA 108 (90 BASE) MCG/ACT IN AERS
2.0000 | INHALATION_SPRAY | Freq: Four times a day (QID) | RESPIRATORY_TRACT | Status: DC
  Administered 2015-09-02: 2 via RESPIRATORY_TRACT
  Filled 2015-09-02: qty 6.7

## 2015-09-02 MED ORDER — SODIUM CHLORIDE 0.9 % IV SOLN
Freq: Once | INTRAVENOUS | Status: AC
  Administered 2015-09-02: 12:00:00 via INTRAVENOUS

## 2015-09-02 MED ORDER — ALBUTEROL (5 MG/ML) CONTINUOUS INHALATION SOLN: 10.0000 mg/h | INHALATION_SOLUTION | RESPIRATORY_TRACT | Status: DC

## 2015-09-02 MED ORDER — MAGNESIUM SULFATE 2 GM/50ML IV SOLN
2.0000 g | INTRAVENOUS | Status: AC
  Administered 2015-09-02: 2 g via INTRAVENOUS
  Filled 2015-09-02: qty 50

## 2015-09-02 MED ORDER — PREDNISONE 20 MG PO TABS: 40.0000 mg | ORAL_TABLET | Freq: Every day | ORAL | 0 refills | Status: DC

## 2015-09-02 MED ORDER — METHYLPREDNISOLONE SODIUM SUCC 125 MG IJ SOLR
125.0000 mg | INTRAMUSCULAR | Status: AC
  Administered 2015-09-02: 125 mg via INTRAVENOUS
  Filled 2015-09-02: qty 2

## 2015-09-02 MED ORDER — ONDANSETRON HCL 4 MG/2ML IJ SOLN: 4.0000 mg | Freq: Once | INTRAMUSCULAR | Status: DC

## 2015-09-02 MED ORDER — ALBUTEROL (5 MG/ML) CONTINUOUS INHALATION SOLN
INHALATION_SOLUTION | RESPIRATORY_TRACT | Status: AC
  Filled 2015-09-02: qty 40

## 2015-09-02 NOTE — ED Notes (Signed)
edp at bedside  

## 2015-09-02 NOTE — ED Triage Notes (Signed)
Pt. Here for SOB starting at 0500 this morning and worsening with movent. Pt. Hx of asthma. Pt. sts she did smoke one cigarette this morning. Pt. Also c/o right sided facial swelling, eye watering, HA, and blurry vision in right eye. Pt. Able to move all 4 extremities at this time.

## 2015-09-02 NOTE — ED Notes (Signed)
MD at bedside. 

## 2015-09-02 NOTE — ED Notes (Addendum)
Pt reporting blurred vision in the right eye. Eye noted to have "foggy" appearance over the pupil. MD notified.

## 2015-09-02 NOTE — Discharge Instructions (Signed)
It is very important that you proceed directly to our ophthalmologist's office for additional evaluation.  After evaluation of your vision changes, for the next 4 days, please use medication as prescribed, and the provided albuterol, every 4 hours, for the next 2 days.  Sure to follow up with your primary care physician, as well as ophthalmology.

## 2015-09-02 NOTE — ED Notes (Addendum)
Resp therapist at bedside.

## 2015-09-02 NOTE — ED Provider Notes (Signed)
[facility] DEPT Provider Note   CSN: [id number] Arrival date & time: 09/02/15  1111     History   Chief Complaint Chief Complaint  Patient presents with  . Shortness of Breath  . Facial Swelling    HPI Rachel Nolan is a 45 y.o. female.  HPI patient presents in respiratory distress. She can continue to get history of present illness, though she is actively receiving continuous bronchodilator, limiting the history of present illness somewhat. However, the patient seems to have developed dyspnea since yesterday, and ran out of her home albuterol. Since onset symptoms of been progressive, with no relief from anything. She doesn't describe pain on the right side of her head and face, but denies throat fullness, chest pain beyond tightness. She denies fever. She states that she was well until yesterday. She denies medical problems beyond asthma, states that she has not been hospitalized for this, nor had steroids in a long time.   Past Medical History:  Diagnosis Date  . Asthma   . Scoliosis     There are no active problems to display for this patient.   Past Surgical History:  Procedure Laterality Date  . BACK SURGERY      OB History    No data available       Home Medications    Prior to Admission medications   Medication Sig Start Date End Date Taking? Authorizing Provider  albuterol (PROVENTIL HFA;VENTOLIN HFA) 108 (90 BASE) MCG/ACT inhaler Inhale 2 puffs into the lungs 4 (four) times daily. For shortness of breath   Yes Historical Provider, MD    Family History History reviewed. No pertinent family history.  Social History Social History  Substance Use Topics  . Smoking status: Current Every Day Smoker    Types: Cigarettes  . Smokeless tobacco: Never Used  . Alcohol use Yes     Allergies   Review of patient's allergies indicates no known allergies.   Review of Systems Review of Systems  Constitutional:       Per HPI, otherwise negative   HENT:       Per HPI, otherwise negative  Respiratory: Positive for cough, chest tightness, shortness of breath and wheezing.   Cardiovascular:       Per HPI, otherwise negative  Gastrointestinal: Negative for vomiting.  Endocrine:       Negative aside from HPI  Genitourinary:       Neg aside from HPI   Musculoskeletal:       Per HPI, otherwise negative  Skin: Negative.   Neurological: Negative for syncope and weakness.     Physical Exam Updated Vital Signs BP 129/72 (BP Location: Right Arm)   Pulse 100   Temp 98.5 F (36.9 C) (Oral)   Resp 25   Ht 5\' 6"  (1.676 m)   Wt 189 lb (85.7 kg)   LMP 08/29/2015 (Exact Date)   SpO2 (!) 87%   BMI 30.51 kg/m   Physical Exam  Constitutional: She is oriented to person, place, and time. She appears well-developed and well-nourished. She appears distressed.  HENT:  Head: Normocephalic and atraumatic.  Eyes: Conjunctivae and EOM are normal.  Cardiovascular: Normal rate and regular rhythm.   Pulmonary/Chest: No stridor. She is in respiratory distress. She has wheezes.  Abdominal: She exhibits no distension.  Musculoskeletal: She exhibits no edema.  Neurological: She is alert and oriented to person, place, and time. No cranial nerve deficit.  Skin: Skin is warm. She is diaphoretic.  Psychiatric: She has  a normal mood and affect.  Nursing note and vitals reviewed.    ED Treatments / Results  Labs (all labs ordered are listed, but only abnormal results are displayed) Labs Reviewed - No data to display  EKG  EKG Interpretation  Date/Time:  Tuesday September 02 2015 11:18:44 EDT Ventricular Rate:  89 PR Interval:    QRS Duration: 96 QT Interval:  411 QTC Calculation: 501 R Axis:   45 Text Interpretation:  Sinus rhythm Borderline prolonged QT interval Baseline wander in lead(s) V2 Abnormal ekg Confirmed by Gerhard MunchLOCKWOOD, Doni Bacha  MD ([id number]) on 09/02/2015 11:25:29 AM     Immediately after the initial evaluation with continued  respiratory distress the patient was switched to continuous albuterol session, one hour.   Radiology Dg Chest Portable 1 View  Result Date: 09/02/2015 CLINICAL DATA:  Shortness of breath and chest tightness EXAM: PORTABLE CHEST 1 VIEW COMPARISON:  05/23/2014 FINDINGS: Cardiac shadow is within normal limits. The lungs are well-aerated without focal infiltrate. No sizable effusion is seen. Fixation rod is noted in the thoracolumbar spine. IMPRESSION: No acute abnormality noted. Electronically Signed   By: Alcide CleverMark  Lukens M.D.   On: 09/02/2015 12:15    Procedures Procedures (including critical care time)  Medications Ordered in ED Medications  albuterol (PROVENTIL, VENTOLIN) (5 MG/ML) 0.5% continuous inhalation solution (  Not Given 09/02/15 1155)  albuterol (PROVENTIL HFA;VENTOLIN HFA) 108 (90 Base) MCG/ACT inhaler 2 puff (not administered)  albuterol (PROVENTIL) (2.5 MG/3ML) 0.083% nebulizer solution 5 mg (5 mg Nebulization Given 09/02/15 1119)  methylPREDNISolone sodium succinate (SOLU-MEDROL) 125 mg/2 mL injection 125 mg (125 mg Intravenous Given 09/02/15 1133)  magnesium sulfate IVPB 2 g 50 mL (0 g Intravenous Stopped 09/02/15 1424)  0.9 %  sodium chloride infusion ( Intravenous New Bag/Given 09/02/15 1137)  albuterol (PROVENTIL) (2.5 MG/3ML) 0.083% nebulizer solution 10 mg (10 mg Nebulization Given 09/02/15 1156)  morphine 4 MG/ML injection 2 mg (2 mg Intravenous Given 09/02/15 1540)     Initial Impression / Assessment and Plan / ED Course  I have reviewed the triage vital signs and the nursing notes.  Pertinent labs & imaging results that were available during my care of the patient were reviewed by me and considered in my medical decision making (see chart for details).  Clinical Course    2:04 PM Patient substantially better.  Lung sounds clear, but she is tachycardic.  Final Clinical Impressions(s) / ED Diagnoses   Final diagnoses:  None   2:54 PM Patient states that her  breathing has improved substantially, but that she is concerned about new vision changes on the right eye. On repeat exam the patient has symmetric equal reactive pupils bilaterally, some opacification of the pupil on the right She denies ability to see more than vague shapes, lites from the right eye. Patient also complains of tenderness in the right temporal region.  Visual acuity notable for normal vision left eye, inability to only discern rough shapes on the right. Head CT unremarkable. Subsequently I discussed patient's case with our ophthalmologist, arranged for discharge to her facility for evaluation.   Patient presents in respiratory distress. Patient has a history of asthma, smokes, ran out of her albuterol yesterday. Here, after multiple breathing treatments, and continuous albuterol the patient has substantial improvement in her aeration, oxygenation, has no ongoing wheezing. However, after the patient had proven in her respiratory function, she complained of vision changes. Subsequent evaluation demonstrated notable changes in visual capacity from right eye. I discussed her case  with our ophthalmologist on-call, arranged for discharge to her facility for additional evaluation. Patient describes pain on the right head, but has no neurologic dysfunction, CT scan unremarkable, and without other neurologic complaints, emergent ophthalmologic exam warranted.   New Prescriptions New Prescriptions   PREDNISONE (DELTASONE) 20 MG TABLET    Take 2 tablets (40 mg total) by mouth daily with breakfast. For the next four days     Gerhard Munch, MD 09/02/15 1552

## 2018-07-17 ENCOUNTER — Other Ambulatory Visit: Payer: Self-pay

## 2018-07-17 ENCOUNTER — Encounter (HOSPITAL_COMMUNITY): Payer: Self-pay

## 2018-07-17 ENCOUNTER — Emergency Department (HOSPITAL_COMMUNITY)
Admission: EM | Admit: 2018-07-17 | Discharge: 2018-07-17 | Discharge status: Against Medical Advice | Payer: Self-pay | Attending: Emergency Medicine | Admitting: Emergency Medicine

## 2018-07-17 ENCOUNTER — Emergency Department (HOSPITAL_COMMUNITY): Payer: Self-pay

## 2018-07-17 DIAGNOSIS — Z5321 Procedure and treatment not carried out due to patient leaving prior to being seen by health care provider: Secondary | ICD-10-CM | POA: Insufficient documentation

## 2018-07-17 MED ORDER — ALBUTEROL SULFATE HFA 108 (90 BASE) MCG/ACT IN AERS
1.0000 | INHALATION_SPRAY | Freq: Once | RESPIRATORY_TRACT | Status: AC
  Administered 2018-07-17: 2 via RESPIRATORY_TRACT
  Filled 2018-07-17: qty 6.7

## 2018-07-17 NOTE — ED Triage Notes (Signed)
Pt reports increased SOB today pt tachypenic in triage, 99% on room air. Pt reports using inhaler at home without relief.

## 2018-08-25 ENCOUNTER — Ambulatory Visit (HOSPITAL_COMMUNITY)
Admission: EM | Admit: 2018-08-25 | Discharge: 2018-08-25 | Disposition: A | Discharge status: Home / Self Care | Payer: Self-pay | Attending: Family Medicine | Admitting: Family Medicine

## 2018-08-25 ENCOUNTER — Other Ambulatory Visit: Payer: Self-pay

## 2018-08-25 ENCOUNTER — Telehealth (HOSPITAL_COMMUNITY): Payer: Self-pay

## 2018-08-25 ENCOUNTER — Encounter (HOSPITAL_COMMUNITY): Payer: Self-pay

## 2018-08-25 DIAGNOSIS — N926 Irregular menstruation, unspecified: Secondary | ICD-10-CM

## 2018-08-25 DIAGNOSIS — H538 Other visual disturbances: Secondary | ICD-10-CM

## 2018-08-25 DIAGNOSIS — M5442 Lumbago with sciatica, left side: Secondary | ICD-10-CM

## 2018-08-25 LAB — POCT PREGNANCY, URINE: Preg Test, Ur: NEGATIVE

## 2018-08-25 MED ORDER — PREDNISONE 50 MG PO TABS: 50.0000 mg | ORAL_TABLET | Freq: Every day | ORAL | 0 refills | Status: DC

## 2018-08-25 MED ORDER — CYCLOBENZAPRINE HCL 5 MG PO TABS: 5.0000 mg | ORAL_TABLET | Freq: Two times a day (BID) | ORAL | 0 refills | Status: DC | PRN

## 2018-08-25 MED ORDER — KETOROLAC TROMETHAMINE 30 MG/ML IJ SOLN
INTRAMUSCULAR | Status: AC
  Filled 2018-08-25: qty 1

## 2018-08-25 MED ORDER — DEXAMETHASONE SODIUM PHOSPHATE 10 MG/ML IJ SOLN
INTRAMUSCULAR | Status: AC
  Filled 2018-08-25: qty 1

## 2018-08-25 MED ORDER — KETOROLAC TROMETHAMINE 30 MG/ML IJ SOLN
30.0000 mg | Freq: Once | INTRAMUSCULAR | Status: AC
  Administered 2018-08-25: 30 mg via INTRAMUSCULAR

## 2018-08-25 MED ORDER — DEXAMETHASONE SODIUM PHOSPHATE 10 MG/ML IJ SOLN
10.0000 mg | Freq: Once | INTRAMUSCULAR | Status: AC
  Administered 2018-08-25: 10 mg via INTRAMUSCULAR

## 2018-08-25 NOTE — ED Triage Notes (Signed)
Pt presents with right eye blurriness that she states sometime makes her head hurt.  Pt also complains of left leg pain that starts at her lower back and radiates to her left hip and down her left leg for 2 weeks.

## 2018-08-25 NOTE — Discharge Instructions (Addendum)
Please follow up with opthalmology for further evaluation of decreased vision in right eye  For your back/leg pain we gave you a shot of toradol, decadron. Continue with prednisone daily x 5 days with food You may use flexeril as needed to help with pain. This is a muscle relaxer and causes sedation- please use only at bedtime or when you will be home and not have to drive/work  Constipation- Increase water intake- 1/2 body weight in ounces, increase fiber, begin daily capful of miralax with colace 1-2 times daily.   Please follow up if symptoms not resolving with the above, developing issues with urination or bowel movements, weakness, worsening pain

## 2018-08-25 NOTE — ED Provider Notes (Signed)
[facility]    CSN: [id number] Arrival date & time: 08/25/18  1529      History   Chief Complaint Chief Complaint  Patient presents with  . Leg Pain    Left  . Eye Problem    Right    HPI Rachel Nolan is a 48 y.o. female significant past medical history presenting today for evaluation of right eye blurriness as well as left low back and leg pain.  Her main complaint is left leg pain.  She notes that over the past 2 months she has had discomfort in her lower back that radiates into her left leg.  States that it is a shooting throbbing pain.  Has tingling in her toes.  States that of recently her pain is worsened.  Denies any bowel or bladder dysfunction although does note that she has some constipation.  Typically has 1 bowel movement a week.  Denies urinary incontinence.  Denies saddle anesthesia.  She has been taking ibuprofen without relief.  She has rod placement in her spine from scoliosis when she was younger.  She denies any new injury or fall to back.  Is having pain with applying full pressure to foot.  Denies fevers, night sweats.  Has difficulty getting comfortable at night due to pain.  Denies unintentional weight loss.  She also notes that she has had blurriness in her right eye over the past 2 weeks.  She has had associated headaches with this, but denies pain in the eye itself.  She denies history of diabetes.  Was told she has hypertension previously but does not take medicine.  She denies drainage, itching or irritation with eye.  Denies redness.  States that it looks as if she has an "cat eye".  Denies photophobia.   Patient also requesting pregnancy test as she notes that she not had a menstrual cycle for the past 3 months.  HPI  Past Medical History:  Diagnosis Date  . Asthma   . Scoliosis     There are no active problems to display for this patient.   Past Surgical History:  Procedure Laterality Date  . BACK SURGERY    . ECTOPIC PREGNANCY  SURGERY      OB History   No obstetric history on file.      Home Medications    Prior to Admission medications   Medication Sig Start Date End Date Taking? Authorizing Provider  albuterol (PROVENTIL HFA;VENTOLIN HFA) 108 (90 BASE) MCG/ACT inhaler Inhale 2 puffs into the lungs 4 (four) times daily. For shortness of breath    [provider]  cyclobenzaprine (FLEXERIL) 5 MG tablet Take 1-2 tablets (5-10 mg total) by mouth 2 (two) times daily as needed for muscle spasms. 08/25/18   Kaaliyah Kita C, PA-C  predniSONE (DELTASONE) 50 MG tablet Take 1 tablet (50 mg total) by mouth daily for 5 days. 08/25/18 08/30/18  Samhita Kretsch, Elesa Hacker, PA-C    Family History Family History  Family history unknown: Yes    Social History Social History   Tobacco Use  . Smoking status: Current Every Day Smoker    Types: Cigarettes  . Smokeless tobacco: Never Used  Substance Use Topics  . Alcohol use: Yes  . Drug use: Yes    Types: Marijuana     Allergies   Patient has no known allergies.   Review of Systems Review of Systems  Constitutional: Negative for activity change, appetite change and fever.  Eyes: Positive for visual disturbance. Negative  for photophobia, pain, discharge, redness and itching.  Respiratory: Negative for shortness of breath.   Cardiovascular: Negative for chest pain.  Gastrointestinal: Negative for abdominal pain, diarrhea, nausea and vomiting.  Genitourinary: Positive for menstrual problem. Negative for dysuria, flank pain, genital sores, hematuria, vaginal bleeding, vaginal discharge and vaginal pain.  Musculoskeletal: Positive for back pain and myalgias.  Skin: Negative for color change and rash.  Neurological: Positive for headaches. Negative for dizziness and light-headedness.     Physical Exam Triage Vital Signs ED Triage Vitals  Enc Vitals Group     BP 08/25/18 1603 (!) 141/96     Pulse Rate 08/25/18 1603 (!) 106     Resp 08/25/18 1603 18     Temp  08/25/18 1603 98.7 F (37.1 C)     Temp Source 08/25/18 1603 Oral     SpO2 08/25/18 1603 100 %     Weight --      Height --      Head Circumference --      Peak Flow --      Pain Score 08/25/18 1604 8     Pain Loc --      Pain Edu? --      Excl. in GC? --    No data found.  Updated Vital Signs BP (!) 141/96 (BP Location: Left Arm)   Pulse (!) 106   Temp 98.7 F (37.1 C) (Oral)   Resp 18   LMP 04/19/2018   SpO2 100%   Visual Acuity Right Eye Distance:   Left Eye Distance:   Bilateral Distance:    Right Eye Near: R Near: 20/200 Left Eye Near:  L Near: 20/100 Bilateral Near:     Physical Exam Vitals signs and nursing note reviewed.  Constitutional:      General: She is not in acute distress.    Appearance: She is well-developed.  HENT:     Head: Normocephalic and atraumatic.  Eyes:     Extraocular Movements: Extraocular movements intact.     Conjunctiva/sclera: Conjunctivae normal.     Comments: Pupils reactive, right pupil appears opaque.  Patient has medial deviation of eye occasionally.  Difficult to perform funduscopic on right eye.  No erythema, no photophobia  Neck:     Musculoskeletal: Neck supple.  Cardiovascular:     Rate and Rhythm: Normal rate and regular rhythm.     Heart sounds: No murmur.  Pulmonary:     Effort: Pulmonary effort is normal. No respiratory distress.     Breath sounds: Normal breath sounds.  Abdominal:     Palpations: Abdomen is soft.     Tenderness: There is no abdominal tenderness.  Musculoskeletal:     Comments: Lumbar spine diffusely tender midline, no focal tenderness palpable deformity or step-off, increased tenderness to paraspinal and left lateral lumbar musculature, tender to left gluteal/sacral musculature.  Positive straight leg raise.  Strength 5/5 and equal bilaterally with hip adduction and abduction, mild weakness noted on left side with hip flexion-seems to be related to pain rather than true weakness Patellar  reflex 2+ bilaterally  Skin:    General: Skin is warm and dry.  Neurological:     Mental Status: She is alert.      UC Treatments / Results  Labs (all labs ordered are listed, but only abnormal results are displayed) Labs Reviewed  POC URINE PREG, ED    EKG   Radiology No results found.  Procedures Procedures (including critical care time)  Medications Ordered  in UC Medications  ketorolac (TORADOL) 30 MG/ML injection 30 mg (30 mg Intramuscular Given 08/25/18 1659)  dexamethasone (DECADRON) injection 10 mg (10 mg Intramuscular Given 08/25/18 1659)  ketorolac (TORADOL) 30 MG/ML injection (has no administration in time range)  dexamethasone (DECADRON) 10 MG/ML injection (has no administration in time range)    Initial Impression / Assessment and Plan / UC Course  I have reviewed the triage vital signs and the nursing notes.  Pertinent labs & imaging results that were available during my care of the patient were reviewed by me and considered in my medical decision making (see chart for details).     Patient with left-sided lower back pain, diffuse tenderness midline, will more tender laterally.  Most likely muscular strain/sciatica causing discomfort.  No injury.  Do not suspect underlying acute fracture.  Will provide Toradol and Decadron, continue on prednisone beginning tomorrow given she has been using NSAIDs and ibuprofen without relief.  Flexeril.  Continue to monitor.  No red flags for cauda equina.  Does have mild weakness with hip flexion, but this seems more related to resistance to avoid pain rather than true weakness.  Patient is right pupil appears opaque, possible cataract.  Given decreased vision blurriness, recommending follow-up with ophthalmology.  Do not suspect underlying eye emergency at this time.  Pregnancy test negative, performed per patient request.  Discussed likely perimenopausal.  Discussed strict return precautions. Patient verbalized understanding  and is agreeable with plan.  Final Clinical Impressions(s) / UC Diagnoses   Final diagnoses:  Blurred vision, right eye  Acute left-sided low back pain with left-sided sciatica  Irregular menstrual cycle     Discharge Instructions     Please follow up with opthalmology for further evaluation of decreased vision in right eye  For your back/leg pain we gave you a shot of toradol, decadron. Continue with prednisone daily x 5 days with food You may use flexeril as needed to help with pain. This is a muscle relaxer and causes sedation- please use only at bedtime or when you will be home and not have to drive/work  Constipation- Increase water intake- 1/2 body weight in ounces, increase fiber, begin daily capful of miralax with colace 1-2 times daily.   Please follow up if symptoms not resolving with the above, developing issues with urination or bowel movements, weakness, worsening pain   ED Prescriptions    Medication Sig Dispense Auth. Provider   predniSONE (DELTASONE) 50 MG tablet Take 1 tablet (50 mg total) by mouth daily for 5 days. 5 tablet Lorien Shingler C, PA-C   cyclobenzaprine (FLEXERIL) 5 MG tablet Take 1-2 tablets (5-10 mg total) by mouth 2 (two) times daily as needed for muscle spasms. 24 tablet Zaeda Mcferran, [place] BendHallie C, PA-C     Controlled Substance Prescriptions [place] Controlled Substance Registry consulted? Not Applicable   Lew DawesWieters, Aaren Krog C, [place]PA-C 08/25/18 1707

## 2018-08-28 ENCOUNTER — Telehealth (HOSPITAL_COMMUNITY): Payer: Self-pay

## 2018-08-28 MED ORDER — CYCLOBENZAPRINE HCL 5 MG PO TABS: 5.0000 mg | ORAL_TABLET | Freq: Two times a day (BID) | ORAL | 0 refills | Status: DC | PRN

## 2018-08-28 MED ORDER — PREDNISONE 50 MG PO TABS: 50.0000 mg | ORAL_TABLET | Freq: Every day | ORAL | 0 refills | Status: AC

## 2018-08-29 ENCOUNTER — Telehealth (HOSPITAL_COMMUNITY): Payer: Self-pay | Admitting: Emergency Medicine

## 2018-08-29 NOTE — Telephone Encounter (Signed)
Pt called asking about her medications, where they were sent. Pt given information of location of medicine. Pt asked "is it ready?" Told patient there was confirmation by their pharmacy that they've receieved it. Pt asked for phone number for health and wellness, number given, pt upset and stated "I had to wait on hold for an hour on that number". Unable to provide patient with anything else at this time, gave her address for health and wellness. Pt will call back if she has any further questions.

## 2019-02-02 ENCOUNTER — Other Ambulatory Visit: Payer: Self-pay | Admitting: Critical Care Medicine

## 2019-02-02 DIAGNOSIS — Z20822 Contact with and (suspected) exposure to covid-19: Secondary | ICD-10-CM

## 2019-02-03 LAB — NOVEL CORONAVIRUS, NAA: SARS-CoV-2, NAA: DETECTED — AB

## 2019-02-14 ENCOUNTER — Emergency Department (HOSPITAL_COMMUNITY)
Admission: EM | Admit: 2019-02-14 | Discharge: 2019-02-14 | Disposition: A | Discharge status: Home / Self Care | Payer: Self-pay | Attending: Emergency Medicine | Admitting: Emergency Medicine

## 2019-02-14 ENCOUNTER — Encounter (HOSPITAL_COMMUNITY): Payer: Self-pay | Admitting: Emergency Medicine

## 2019-02-14 DIAGNOSIS — F1990 Other psychoactive substance use, unspecified, uncomplicated: Secondary | ICD-10-CM | POA: Insufficient documentation

## 2019-02-14 DIAGNOSIS — F32A Depression, unspecified: Secondary | ICD-10-CM

## 2019-02-14 DIAGNOSIS — Z590 Homelessness: Secondary | ICD-10-CM | POA: Insufficient documentation

## 2019-02-14 DIAGNOSIS — F17210 Nicotine dependence, cigarettes, uncomplicated: Secondary | ICD-10-CM | POA: Insufficient documentation

## 2019-02-14 DIAGNOSIS — R45851 Suicidal ideations: Secondary | ICD-10-CM | POA: Insufficient documentation

## 2019-02-14 DIAGNOSIS — Z8709 Personal history of other diseases of the respiratory system: Secondary | ICD-10-CM | POA: Insufficient documentation

## 2019-02-14 DIAGNOSIS — F329 Major depressive disorder, single episode, unspecified: Secondary | ICD-10-CM | POA: Insufficient documentation

## 2019-02-14 LAB — CBC
HCT: 41.4 % (ref 36.0–46.0)
Hemoglobin: 14 g/dL (ref 12.0–15.0)
MCH: 31.7 pg (ref 26.0–34.0)
MCHC: 33.8 g/dL (ref 30.0–36.0)
MCV: 93.9 fL (ref 80.0–100.0)
Platelets: 299 10*3/uL (ref 150–400)
RBC: 4.41 MIL/uL (ref 3.87–5.11)
RDW: 14.8 % (ref 11.5–15.5)
WBC: 9 10*3/uL (ref 4.0–10.5)
nRBC: 0 % (ref 0.0–0.2)

## 2019-02-14 LAB — COMPREHENSIVE METABOLIC PANEL
ALT: 30 U/L (ref 0–44)
AST: 31 U/L (ref 15–41)
Albumin: 3.9 g/dL (ref 3.5–5.0)
Alkaline Phosphatase: 60 U/L (ref 38–126)
Anion gap: 11 (ref 5–15)
BUN: 10 mg/dL (ref 6–20)
CO2: 25 mmol/L (ref 22–32)
Calcium: 9.6 mg/dL (ref 8.9–10.3)
Chloride: 104 mmol/L (ref 98–111)
Creatinine, Ser: 0.78 mg/dL (ref 0.44–1.00)
GFR calc Af Amer: >60 mL/min (ref >60)
GFR calc non Af Amer: >60 mL/min (ref >60)
Glucose, Bld: 90 mg/dL (ref 70–99)
Potassium: 4.7 mmol/L (ref 3.5–5.1)
Sodium: 140 mmol/L (ref 135–145)
Total Bilirubin: 0.6 mg/dL (ref 0.3–1.2)
Total Protein: 6.8 g/dL (ref 6.5–8.1)

## 2019-02-14 LAB — ETHANOL: Alcohol, Ethyl (B): <10 mg/dL (ref <10)

## 2019-02-14 LAB — SALICYLATE LEVEL: Salicylate Lvl: <7 mg/dL — ABNORMAL LOW (ref 7.0–30.0)

## 2019-02-14 LAB — I-STAT BETA HCG BLOOD, ED (MC, WL, AP ONLY): I-stat hCG, quantitative: <5 m[IU]/mL (ref <5)

## 2019-02-14 LAB — ACETAMINOPHEN LEVEL: Acetaminophen (Tylenol), Serum: <10 ug/mL — ABNORMAL LOW (ref 10–30)

## 2019-02-14 MED ORDER — ALBUTEROL SULFATE HFA 108 (90 BASE) MCG/ACT IN AERS
2.0000 | INHALATION_SPRAY | RESPIRATORY_TRACT | Status: DC | PRN
  Filled 2019-02-14: qty 6.7

## 2019-02-14 NOTE — ED Notes (Signed)
PT is dressed. SW called and Benedetto Goad is OTW; sandwich and snacks given for ride.
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
## 2019-02-14 NOTE — Progress Notes (Signed)
CSW arranged for transportation to [facility]. CSW notified Marisue Ivan, RN of information and discharge plan.  Edwin Dada, MSW, LCSW-A Transitions of Care  Clinical Social Worker  [facility] Emergency Departments  Medical ICU [phone]
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
## 2019-02-14 NOTE — ED Notes (Signed)
Pt. Given water and crackers at this time.

## 2019-02-14 NOTE — Progress Notes (Signed)
Pt has been psychiatrically cleared per Denzil Magnuson, NP. Pt reports that she is willing to receive treatment and housing at the [facility]. CSW has collaborated with Edwin Dada, LCSW at [facility] ED, who will assist pt with transportation needs to travel to [facility]   Wells Guiles, LCSW, LCAS Disposition CSW [facility]/TTS [phone]

## 2019-03-01 ENCOUNTER — Emergency Department (HOSPITAL_COMMUNITY)
Admission: EM | Admit: 2019-03-01 | Discharge: 2019-03-01 | Disposition: A | Discharge status: Other Acute Inpatient Hospital | Payer: Self-pay | Attending: Emergency Medicine | Admitting: Emergency Medicine

## 2019-03-01 ENCOUNTER — Encounter (HOSPITAL_COMMUNITY): Payer: Self-pay | Admitting: Behavioral Health

## 2019-03-01 ENCOUNTER — Inpatient Hospital Stay (HOSPITAL_COMMUNITY)
Admission: AD | Admit: 2019-03-01 | Discharge: 2019-03-07 | DRG: 885 | Disposition: A | Discharge status: Home / Self Care | Payer: Federal, State, Local not specified - Other | Source: Intra-hospital | Attending: Psychiatry | Admitting: Psychiatry

## 2019-03-01 ENCOUNTER — Other Ambulatory Visit: Payer: Self-pay

## 2019-03-01 DIAGNOSIS — F1424 Cocaine dependence with cocaine-induced mood disorder: Secondary | ICD-10-CM | POA: Diagnosis present

## 2019-03-01 DIAGNOSIS — F1994 Other psychoactive substance use, unspecified with psychoactive substance-induced mood disorder: Secondary | ICD-10-CM | POA: Diagnosis present

## 2019-03-01 DIAGNOSIS — R45851 Suicidal ideations: Secondary | ICD-10-CM | POA: Insufficient documentation

## 2019-03-01 DIAGNOSIS — Z915 Personal history of self-harm: Secondary | ICD-10-CM

## 2019-03-01 DIAGNOSIS — F4310 Post-traumatic stress disorder, unspecified: Secondary | ICD-10-CM | POA: Diagnosis present

## 2019-03-01 DIAGNOSIS — F419 Anxiety disorder, unspecified: Secondary | ICD-10-CM | POA: Diagnosis present

## 2019-03-01 DIAGNOSIS — F1420 Cocaine dependence, uncomplicated: Secondary | ICD-10-CM | POA: Insufficient documentation

## 2019-03-01 DIAGNOSIS — K219 Gastro-esophageal reflux disease without esophagitis: Secondary | ICD-10-CM | POA: Diagnosis present

## 2019-03-01 DIAGNOSIS — Z79899 Other long term (current) drug therapy: Secondary | ICD-10-CM | POA: Insufficient documentation

## 2019-03-01 DIAGNOSIS — F322 Major depressive disorder, single episode, severe without psychotic features: Secondary | ICD-10-CM

## 2019-03-01 DIAGNOSIS — F17210 Nicotine dependence, cigarettes, uncomplicated: Secondary | ICD-10-CM | POA: Diagnosis present

## 2019-03-01 DIAGNOSIS — Z590 Homelessness: Secondary | ICD-10-CM

## 2019-03-01 DIAGNOSIS — R440 Auditory hallucinations: Secondary | ICD-10-CM | POA: Diagnosis present

## 2019-03-01 DIAGNOSIS — F329 Major depressive disorder, single episode, unspecified: Secondary | ICD-10-CM | POA: Diagnosis present

## 2019-03-01 DIAGNOSIS — F1410 Cocaine abuse, uncomplicated: Secondary | ICD-10-CM

## 2019-03-01 DIAGNOSIS — J45909 Unspecified asthma, uncomplicated: Secondary | ICD-10-CM | POA: Insufficient documentation

## 2019-03-01 DIAGNOSIS — R531 Weakness: Secondary | ICD-10-CM | POA: Diagnosis present

## 2019-03-01 DIAGNOSIS — F1910 Other psychoactive substance abuse, uncomplicated: Secondary | ICD-10-CM

## 2019-03-01 DIAGNOSIS — Z20822 Contact with and (suspected) exposure to covid-19: Secondary | ICD-10-CM | POA: Insufficient documentation

## 2019-03-01 DIAGNOSIS — F32A Depression, unspecified: Secondary | ICD-10-CM | POA: Diagnosis present

## 2019-03-01 DIAGNOSIS — F332 Major depressive disorder, recurrent severe without psychotic features: Principal | ICD-10-CM | POA: Diagnosis present

## 2019-03-01 DIAGNOSIS — I10 Essential (primary) hypertension: Secondary | ICD-10-CM | POA: Diagnosis present

## 2019-03-01 DIAGNOSIS — Z814 Family history of other substance abuse and dependence: Secondary | ICD-10-CM

## 2019-03-01 LAB — RAPID URINE DRUG SCREEN, HOSP PERFORMED
Amphetamines: NOT DETECTED
Barbiturates: NOT DETECTED
Benzodiazepines: NOT DETECTED
Cocaine: POSITIVE — AB
Opiates: NOT DETECTED
Tetrahydrocannabinol: NOT DETECTED

## 2019-03-01 LAB — COMPREHENSIVE METABOLIC PANEL
ALT: 11 U/L (ref 0–44)
AST: 18 U/L (ref 15–41)
Albumin: 3.3 g/dL — ABNORMAL LOW (ref 3.5–5.0)
Alkaline Phosphatase: 53 U/L (ref 38–126)
Anion gap: 9 (ref 5–15)
BUN: 10 mg/dL (ref 6–20)
CO2: 26 mmol/L (ref 22–32)
Calcium: 8.7 mg/dL — ABNORMAL LOW (ref 8.9–10.3)
Chloride: 104 mmol/L (ref 98–111)
Creatinine, Ser: 0.97 mg/dL (ref 0.44–1.00)
GFR calc Af Amer: >60 mL/min (ref >60)
GFR calc non Af Amer: >60 mL/min (ref >60)
Glucose, Bld: 108 mg/dL — ABNORMAL HIGH (ref 70–99)
Potassium: 4.2 mmol/L (ref 3.5–5.1)
Sodium: 139 mmol/L (ref 135–145)
Total Bilirubin: 0.3 mg/dL (ref 0.3–1.2)
Total Protein: 6 g/dL — ABNORMAL LOW (ref 6.5–8.1)

## 2019-03-01 LAB — CBC
HCT: 41.7 % (ref 36.0–46.0)
Hemoglobin: 13.8 g/dL (ref 12.0–15.0)
MCH: 31.6 pg (ref 26.0–34.0)
MCHC: 33.1 g/dL (ref 30.0–36.0)
MCV: 95.4 fL (ref 80.0–100.0)
Platelets: 287 10*3/uL (ref 150–400)
RBC: 4.37 MIL/uL (ref 3.87–5.11)
RDW: 14.6 % (ref 11.5–15.5)
WBC: 5.5 10*3/uL (ref 4.0–10.5)
nRBC: 0 % (ref 0.0–0.2)

## 2019-03-01 LAB — I-STAT BETA HCG BLOOD, ED (MC, WL, AP ONLY): I-stat hCG, quantitative: <5 m[IU]/mL (ref <5)

## 2019-03-01 LAB — ETHANOL: Alcohol, Ethyl (B): <10 mg/dL (ref <10)

## 2019-03-01 LAB — RESPIRATORY PANEL BY RT PCR (FLU A&B, COVID)
Influenza A by PCR: NEGATIVE
Influenza B by PCR: NEGATIVE
SARS Coronavirus 2 by RT PCR: NEGATIVE

## 2019-03-01 LAB — SALICYLATE LEVEL: Salicylate Lvl: <7 mg/dL — ABNORMAL LOW (ref 7.0–30.0)

## 2019-03-01 LAB — ACETAMINOPHEN LEVEL: Acetaminophen (Tylenol), Serum: <10 ug/mL — ABNORMAL LOW (ref 10–30)

## 2019-03-01 MED ORDER — ZOLPIDEM TARTRATE 5 MG PO TABS
5.0000 mg | ORAL_TABLET | Freq: Every evening | ORAL | Status: DC | PRN
  Administered 2019-03-01: 5 mg via ORAL
  Filled 2019-03-01: qty 1

## 2019-03-01 MED ORDER — ONDANSETRON HCL 4 MG PO TABS
4.0000 mg | ORAL_TABLET | Freq: Three times a day (TID) | ORAL | Status: DC | PRN
  Filled 2019-03-01: qty 1

## 2019-03-01 MED ORDER — ZOLPIDEM TARTRATE 5 MG PO TABS: 5.0000 mg | ORAL_TABLET | Freq: Every evening | ORAL | Status: DC | PRN

## 2019-03-01 MED ORDER — ALBUTEROL SULFATE HFA 108 (90 BASE) MCG/ACT IN AERS
2.0000 | INHALATION_SPRAY | Freq: Four times a day (QID) | RESPIRATORY_TRACT | Status: DC | PRN
  Administered 2019-03-04 – 2019-03-06 (×2): 2 via RESPIRATORY_TRACT
  Filled 2019-03-01: qty 6.7

## 2019-03-01 MED ORDER — ALUM & MAG HYDROXIDE-SIMETH 200-200-20 MG/5ML PO SUSP: 30.0000 mL | Freq: Four times a day (QID) | ORAL | Status: DC | PRN

## 2019-03-01 MED ORDER — IBUPROFEN 400 MG PO TABS: 600.0000 mg | ORAL_TABLET | Freq: Three times a day (TID) | ORAL | Status: DC | PRN

## 2019-03-01 MED ORDER — ALBUTEROL SULFATE HFA 108 (90 BASE) MCG/ACT IN AERS: 2.0000 | INHALATION_SPRAY | Freq: Four times a day (QID) | RESPIRATORY_TRACT | Status: DC | PRN

## 2019-03-01 MED ORDER — IBUPROFEN 600 MG PO TABS
600.0000 mg | ORAL_TABLET | Freq: Three times a day (TID) | ORAL | Status: DC | PRN
  Administered 2019-03-01 – 2019-03-04 (×3): 600 mg via ORAL
  Filled 2019-03-01 (×3): qty 1

## 2019-03-01 MED ORDER — NICOTINE 21 MG/24HR TD PT24
21.0000 mg | MEDICATED_PATCH | Freq: Every day | TRANSDERMAL | Status: DC
  Administered 2019-03-02 – 2019-03-06 (×5): 21 mg via TRANSDERMAL
  Filled 2019-03-01 (×8): qty 1

## 2019-03-01 MED ORDER — NICOTINE 21 MG/24HR TD PT24
21.0000 mg | MEDICATED_PATCH | Freq: Every day | TRANSDERMAL | Status: DC
  Administered 2019-03-01: 12:00:00 21 mg via TRANSDERMAL
  Filled 2019-03-01: qty 1

## 2019-03-01 MED ORDER — ONDANSETRON HCL 4 MG PO TABS: 4.0000 mg | ORAL_TABLET | Freq: Three times a day (TID) | ORAL | Status: DC | PRN

## 2019-03-01 NOTE — ED Triage Notes (Signed)
Pt reports feelings of self harm for the last 5 months since her father died. Reports recent attempts. Pt reports auditory hallucinations. Pt awake, alert, appropriate.

## 2019-03-01 NOTE — ED Notes (Signed)
Ordered diet tray 
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
## 2019-03-01 NOTE — Progress Notes (Signed)
Patient ID: Rachel Nolan, female   DOB: [birth year], 49 y.o.   MRN: [id number] Pt A&O x 4, resting in bed at present. Cooperative and anxious.  Remains passive SI, contracts for safety.  No distress noted at present.  Monitoring for safety.
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
## 2019-03-01 NOTE — Plan of Care (Signed)
[facility] Observation Crisis Plan  Reason for Crisis Plan:  Chronic Mental Illness/Medical Illness and Substance Abuse, Crisis Stabilization   Plan of Care:  Referral for Inpatient Hospitalization  Family Support:   "No one"   Current Living Environment:  Living Arrangements: Other (Comment)(homeless)  Insurance:   Hospital Account    Name Acct ID Class Status Primary Coverage   Rachel Nolan, Rachel Nolan [id number] BEHAVIORAL HEALTH OBSERVATION [facility] FOR MH/DD/SAS - [facility]        Guarantor Account (for Hospital Account [id number])    Name Relation to Pt Service Area Active? Acct Type   Rachel Nolan Self [facility] Yes Behavioral Health   Address Phone       [address] [place], [place] [address] [phone](H)          Coverage Information (for Hospital Account [id number])    F/O Payor/Plan Precert #   [facility] FOR MH/DD/SAS/[facility]-W[facility]    Subscriber Subscriber #   Rachel Nolan, Rachel Nolan [id number]   Address Phone   [address] [place], [place] [address] [phone]      Legal Guardian:  Legal Guardian: Other:(self)  Primary Care Provider:  Default, Provider, MD  Current Outpatient Providers:  None  Psychiatrist:     Counselor/Therapist:     Compliant with Medications:  No  Additional Information:   Garnette Scheuermann 2/11/20215:09 PM

## 2019-03-01 NOTE — ED Notes (Signed)
PT belongings in locker number 3, and valuables are with security, paper slip is in pt package.

## 2019-03-01 NOTE — ED Notes (Signed)
Pt arrives to room 50 in hospital gown, pt given burgundy scrubs to change into, belongings inventoried at this time and pt wanded by security. RN reviewed med clearance policy with pt with signature and copy placed at bedside.

## 2019-03-01 NOTE — Progress Notes (Signed)
Pt accepted to [facility]; bed 400-1    Denzil Magnuson, NP is the accepting provider.    Dr. Lucianne Muss is the attending provider.    Call report to [phone]    [facility] @ [facility] ED notified.     Pt is involuntary and will be transported by law enforcement  Pt is scheduled to arrive at [facility] at 230pm.    Wells Guiles, LCSW, LCAS Disposition CSW [facility]/TTS [phone]

## 2019-03-02 DIAGNOSIS — F332 Major depressive disorder, recurrent severe without psychotic features: Secondary | ICD-10-CM | POA: Diagnosis present

## 2019-03-02 DIAGNOSIS — K219 Gastro-esophageal reflux disease without esophagitis: Secondary | ICD-10-CM | POA: Diagnosis present

## 2019-03-02 DIAGNOSIS — F1494 Cocaine use, unspecified with cocaine-induced mood disorder: Secondary | ICD-10-CM | POA: Diagnosis not present

## 2019-03-02 DIAGNOSIS — F329 Major depressive disorder, single episode, unspecified: Secondary | ICD-10-CM | POA: Diagnosis present

## 2019-03-02 DIAGNOSIS — R531 Weakness: Secondary | ICD-10-CM | POA: Diagnosis present

## 2019-03-02 DIAGNOSIS — F1424 Cocaine dependence with cocaine-induced mood disorder: Secondary | ICD-10-CM | POA: Diagnosis present

## 2019-03-02 DIAGNOSIS — F4310 Post-traumatic stress disorder, unspecified: Secondary | ICD-10-CM | POA: Diagnosis present

## 2019-03-02 DIAGNOSIS — Z590 Homelessness: Secondary | ICD-10-CM | POA: Diagnosis not present

## 2019-03-02 DIAGNOSIS — F419 Anxiety disorder, unspecified: Secondary | ICD-10-CM | POA: Diagnosis present

## 2019-03-02 DIAGNOSIS — R440 Auditory hallucinations: Secondary | ICD-10-CM | POA: Diagnosis present

## 2019-03-02 DIAGNOSIS — F1410 Cocaine abuse, uncomplicated: Secondary | ICD-10-CM

## 2019-03-02 DIAGNOSIS — F32A Depression, unspecified: Secondary | ICD-10-CM | POA: Diagnosis present

## 2019-03-02 DIAGNOSIS — F1994 Other psychoactive substance use, unspecified with psychoactive substance-induced mood disorder: Secondary | ICD-10-CM | POA: Diagnosis present

## 2019-03-02 DIAGNOSIS — Z814 Family history of other substance abuse and dependence: Secondary | ICD-10-CM | POA: Diagnosis not present

## 2019-03-02 DIAGNOSIS — I10 Essential (primary) hypertension: Secondary | ICD-10-CM | POA: Diagnosis present

## 2019-03-02 DIAGNOSIS — R45851 Suicidal ideations: Secondary | ICD-10-CM | POA: Diagnosis present

## 2019-03-02 DIAGNOSIS — F17210 Nicotine dependence, cigarettes, uncomplicated: Secondary | ICD-10-CM | POA: Diagnosis present

## 2019-03-02 DIAGNOSIS — Z915 Personal history of self-harm: Secondary | ICD-10-CM | POA: Diagnosis not present

## 2019-03-02 LAB — LIPID PANEL
Cholesterol: 113 mg/dL (ref 0–200)
HDL: 42 mg/dL (ref >40)
LDL Cholesterol: 49 mg/dL (ref 0–99)
Total CHOL/HDL Ratio: 2.7 RATIO
Triglycerides: 112 mg/dL (ref <150)
VLDL: 22 mg/dL (ref 0–40)

## 2019-03-02 LAB — TSH: TSH: 0.904 u[IU]/mL (ref 0.350–4.500)

## 2019-03-02 LAB — HEMOGLOBIN A1C
Hgb A1c MFr Bld: 5.5 % (ref 4.8–5.6)
Mean Plasma Glucose: 111.15 mg/dL

## 2019-03-02 MED ORDER — LISINOPRIL 20 MG PO TABS
20.0000 mg | ORAL_TABLET | Freq: Once | ORAL | Status: AC
  Administered 2019-03-02: 20 mg via ORAL
  Filled 2019-03-02 (×2): qty 1

## 2019-03-02 MED ORDER — HYDROXYZINE HCL 25 MG PO TABS
25.0000 mg | ORAL_TABLET | ORAL | Status: DC | PRN
  Administered 2019-03-02 – 2019-03-06 (×6): 25 mg via ORAL
  Filled 2019-03-02 (×7): qty 1
  Filled 2019-03-02: qty 20

## 2019-03-02 MED ORDER — LORAZEPAM 1 MG PO TABS: 1.0000 mg | ORAL_TABLET | Freq: Four times a day (QID) | ORAL | Status: DC | PRN

## 2019-03-02 MED ORDER — TRAZODONE HCL 50 MG PO TABS
50.0000 mg | ORAL_TABLET | Freq: Every evening | ORAL | Status: DC | PRN
  Administered 2019-03-02 – 2019-03-06 (×4): 50 mg via ORAL
  Filled 2019-03-02 (×3): qty 1
  Filled 2019-03-02: qty 14
  Filled 2019-03-02: qty 1

## 2019-03-02 MED ORDER — PANTOPRAZOLE SODIUM 40 MG PO TBEC
40.0000 mg | DELAYED_RELEASE_TABLET | Freq: Every day | ORAL | Status: DC
  Administered 2019-03-02 – 2019-03-06 (×5): 40 mg via ORAL
  Filled 2019-03-02 (×9): qty 1

## 2019-03-02 MED ORDER — FOLIC ACID 1 MG PO TABS
1.0000 mg | ORAL_TABLET | Freq: Every day | ORAL | Status: DC
  Administered 2019-03-02 – 2019-03-06 (×5): 1 mg via ORAL
  Filled 2019-03-02 (×9): qty 1

## 2019-03-02 MED ORDER — LISINOPRIL 10 MG PO TABS
10.0000 mg | ORAL_TABLET | Freq: Every day | ORAL | Status: DC
  Administered 2019-03-03 – 2019-03-06 (×4): 10 mg via ORAL
  Filled 2019-03-02 (×4): qty 1
  Filled 2019-03-02: qty 2
  Filled 2019-03-02 (×2): qty 1

## 2019-03-02 MED ORDER — ACETAMINOPHEN 325 MG PO TABS: 650.0000 mg | ORAL_TABLET | Freq: Four times a day (QID) | ORAL | Status: DC | PRN

## 2019-03-02 MED ORDER — CITALOPRAM HYDROBROMIDE 10 MG PO TABS
10.0000 mg | ORAL_TABLET | Freq: Every day | ORAL | Status: DC
  Administered 2019-03-02 – 2019-03-04 (×3): 10 mg via ORAL
  Filled 2019-03-02 (×7): qty 1

## 2019-03-02 MED ORDER — THIAMINE HCL 100 MG PO TABS
100.0000 mg | ORAL_TABLET | Freq: Every day | ORAL | Status: DC
  Administered 2019-03-02 – 2019-03-06 (×5): 100 mg via ORAL
  Filled 2019-03-02 (×9): qty 1
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
## 2019-03-02 NOTE — Progress Notes (Signed)
Patient did not attend AA group meeting. 

## 2019-03-02 NOTE — Discharge Summary (Signed)
Patient discharged from [facility] observation and admitted to [facility] inpatient RM 301-02.

## 2019-03-02 NOTE — Tx Team (Signed)
Initial Treatment Plan 03/02/2019 12:06 PM [name] TLX:[id number]    PATIENT STRESSORS: Financial difficulties Substance abuse Traumatic event   PATIENT STRENGTHS: Ability for insight Communication skills Motivation for treatment/growth   PATIENT IDENTIFIED PROBLEMS: Suicidal ideations  depression  anxiety  Crying spells  Substance abuse             DISCHARGE CRITERIA:  Ability to meet basic life and health needs Adequate post-discharge living arrangements Improved stabilization in mood, thinking, and/or behavior Motivation to continue treatment in a less acute level of care  PRELIMINARY DISCHARGE PLAN: Attend 12-step recovery group Outpatient therapy Return to previous living arrangement  PATIENT/FAMILY INVOLVEMENT: This treatment plan has been presented to and reviewed with the patient, Rachel Nolan.  The patient and family have been given the opportunity to ask questions and make suggestions.  Raylene Miyamoto, RN 03/02/2019, 12:06 PM

## 2019-03-02 NOTE — Progress Notes (Signed)
   03/02/19 2000  Psych Admission Type (Psych Patients Only)  Admission Status Involuntary  Psychosocial Assessment  Patient Complaints Anxiety  Eye Contact Brief  Facial Expression Animated;Anxious  Affect Anxious;Depressed;Sad  Speech Logical/coherent  Interaction Assertive  Motor Activity Slow;Shuffling  Appearance/Hygiene In scrubs  Behavior Characteristics Cooperative  Mood Depressed  Thought Process  Coherency WDL  Content WDL  Delusions None reported or observed  Perception Hallucinations  Hallucination Auditory  Judgment WDL  Confusion WDL  Danger to Self  Current suicidal ideation? Passive  Self-Injurious Behavior No self-injurious ideation or behavior indicators observed or expressed   Agreement Not to Harm Self Yes  Description of Agreement Verbal  Danger to Others  Danger to Others None reported or observed
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
## 2019-03-02 NOTE — Progress Notes (Signed)
Patient alert and oriented and ambulatory with walker. Patient has passive SI but contracts for safety on unit. Patient took medications without difficulty. Patient provided support and encouragement. Q 15 minute checks in progress and patient remains safe on unit. Monitoring continues.

## 2019-03-02 NOTE — BH Assessment (Signed)
[facility] Assessment Progress Note  Per Landry Mellow, MD, this pt requires psychiatric hospitalization.  Jasmine has assigned pt to [facility] Rm 301-2.  Pt presents under IVC with a First Examination, and IVC documents have been left on pt's chart at [facility].  Pt's nurse has been notified.   Doylene Canning, [place] [facility] Coordinator [phone]

## 2019-03-03 DIAGNOSIS — F332 Major depressive disorder, recurrent severe without psychotic features: Principal | ICD-10-CM

## 2019-03-03 MED ORDER — PRAZOSIN HCL 1 MG PO CAPS
1.0000 mg | ORAL_CAPSULE | Freq: Every day | ORAL | Status: DC
  Administered 2019-03-03 – 2019-03-06 (×4): 1 mg via ORAL
  Filled 2019-03-03 (×7): qty 1

## 2019-03-03 NOTE — [facility] Group Notes (Signed)
[facility] Group Notes: (Clinical Social Work)   03/03/2019      Type of Therapy:  Group Therapy   Participation Level:  Did Not Attend - was invited both individually by [facility] and by overhead announcement, chose not to attend.   Codee Tutson Grossman-Orr, LCSW 03/03/2019, 11:20 AM    
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
## 2019-03-03 NOTE — B[facility] Group Notes (Signed)
Adult Psychoeducational Group Note  Date:  03/03/2019 Time:  3:18 PM  Group Topic/Focus:  Identifying Needs:   The focus of this group is to help patients identify their personal needs that have been historically problematic and identify healthy behaviors to address their needs.  Participation Level:  Did Not Attend   Dione Housekeeper 03/03/2019, 3:18 PM
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
## 2019-03-03 NOTE — Progress Notes (Signed)
D: Pt reported that she is having Suicidal Ideations and said she thinks "about it every day."  Pt denied having a specific plan, but said she would let staff know when she has a specific plan in place. Denied HI.  Endorsed auditory hallucinations.  Said she continues to hear her father's voice which is upsetting to pt.  A:  Emotional support and reassurance provided; encouraged pt to describe feelings and concerns.  Thanked pt for being honest about her SI.  Administered medications per MD orders.  15 min safety checks remain in place.  R:  Pt thanked Charity fundraiser for validating pt's feelings.  Pt said that continues to feel extremely overwhelmed and hopeless.  Pt has not been out of her room much and has not attended any group sessions thus far. Pt remains safe on the unit.  RN will continue to monitor and provide support as needed.

## 2019-03-03 NOTE — [facility] Group Notes (Signed)
Adult Psychoeducational Group Note  Date:  03/03/2019 Time:  3:17 PM  Group Topic/Focus:  Goals Group:   The focus of this group is to help patients establish daily goals to achieve during treatment and discuss how the patient can incorporate goal setting into their daily lives to aide in recovery.  Participation Level:  Did Not Attend   Dione Housekeeper 03/03/2019, 3:17 PM

## 2019-03-03 NOTE — [facility] Suicide Risk Assessment (Signed)
[facility] INPATIENT:  Family/Significant Other Suicide Prevention Education  Suicide Prevention Education:  Patient Refusal for Family/Significant Other Suicide Prevention Education: The patient Rachel Nolan has refused to provide written consent for family/significant other to be provided Family/Significant Other Suicide Prevention Education during admission and/or prior to discharge.  Social worker completed SPE with patient. Physician notified.  Evorn Gong 03/03/2019, 1:00 PM

## 2019-03-03 NOTE — Progress Notes (Signed)
[facility] Group Notes:  (Nursing/MHT/Case Management/Adjunct)  Date:  03/03/2019  Time:  2030  Type of Therapy:  wrap up group  Participation Level:  Active  Participation Quality:  Appropriate, Attentive, Sharing and Supportive  Affect:  Blunted  Cognitive:  Alert  Insight:  Improving  Engagement in Group:  Engaged  Modes of Intervention:  Clarification, Education and Support  Summary of Progress/Problems: Pt reports medicine is helping quiet the voices she has been hearing. Pt plans on finding a place to go because she doesn't want to leave the hospital homeless. Pt is grateful to be alive.   Marcille Buffy 03/03/2019, 9:28 PM

## 2019-03-04 MED ORDER — CITALOPRAM HYDROBROMIDE 20 MG PO TABS
20.0000 mg | ORAL_TABLET | Freq: Every day | ORAL | Status: DC
  Administered 2019-03-05 – 2019-03-06 (×2): 20 mg via ORAL
  Filled 2019-03-04 (×4): qty 1

## 2019-03-04 NOTE — Progress Notes (Signed)
Pt denies SI/HI. Pt seen ambulating in the hallway with a walker. When asked if she was having difficulty walking, she stated "I have sciatic  nerve pain." The pt is requesting to have a walker with a bench seat. Writer encouraged the pt to speak with the doctor about a PT consult for a walker with a bench seat.      03/04/19 0900  Psych Admission Type (Psych Patients Only)  Admission Status Involuntary  Psychosocial Assessment  Patient Complaints Anxiety;Depression  Eye Contact Brief  Facial Expression Flat  Affect Depressed  Speech Logical/coherent  Interaction Minimal  Motor Activity Slow;Unsteady  Appearance/Hygiene In hospital gown  Behavior Characteristics Appropriate to situation  Mood Depressed  Aggressive Behavior  Effect No apparent injury  Thought Process  Coherency Circumstantial  Content WDL  Delusions None reported or observed  Perception WDL  Hallucination None reported or observed  Judgment WDL  Confusion None  Danger to Self  Current suicidal ideation? Denies  Self-Injurious Behavior No self-injurious ideation or behavior indicators observed or expressed   Danger to Others  Danger to Others None reported or observed

## 2019-03-04 NOTE — [facility] Group Notes (Addendum)
[facility] LCSW Group Therapy Note  03/04/2019  10:00-11:00AM  Type of Therapy and Topic:  Group Therapy:  A Hero Worthy of Support  Participation Level:  Did Not Attend   Description of Group:  Patients in this group were introduced to the concept that additional supports including self-support are an essential part of recovery.  Matching needs with supports to help fulfill those needs was explained.  Establishing boundaries that can gradually be increased or decreased was described, with patients giving their own examples of establishing appropriate boundaries in their lives.  A song entitled "My Own Hero" was played and a group discussion ensued in which patients stated it inspired them to help themselves in order to succeed, because other people cannot achieve their goals such as sobriety or stability for them.  A song was played called "I Am Enough" which led to a discussion about being willing to believe we are worth the effort of being a self-support.   Therapeutic Goals: 1)  demonstrate the importance of being a key part of one's own support system 2)  discuss various available supports 3)  encourage patient to use music as part of their self-support and focus on goals 4)  elicit ideas from patients about supports that need to be added   Summary of Patient Progress:  The patient did not attend group.  Therapeutic Modalities:   Motivational Interviewing Activity  [name]

## 2019-03-04 NOTE — Progress Notes (Signed)
   03/03/19 2000  Psychosocial Assessment  Patient Complaints None  Eye Contact Brief  Facial Expression Blank  Affect Depressed  Speech Logical/coherent  Interaction Minimal  Motor Activity Slow  Appearance/Hygiene Disheveled  Behavior Characteristics Cooperative  Mood Depressed  Thought Process  Coherency WDL  Content WDL  Delusions None reported or observed  Hallucination None reported or observed  Judgment Limited  Confusion None  Danger to Self  Current suicidal ideation? Denies  Self-Injurious Behavior No self-injurious ideation or behavior indicators observed or expressed   Danger to Others  Danger to Others None reported or observed
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
## 2019-03-05 DIAGNOSIS — F1420 Cocaine dependence, uncomplicated: Secondary | ICD-10-CM

## 2019-03-05 DIAGNOSIS — F1410 Cocaine abuse, uncomplicated: Secondary | ICD-10-CM
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
## 2019-03-05 NOTE — Progress Notes (Signed)
Recreation Therapy Notes  Date:  2.15.21 Time: 0930 Location: 300 Hall Dayroom  Group Topic: Stress Management  Goal Area(s) Addresses:  Patient will identify positive stress management techniques. Patient will identify benefits of using stress management post d/c.  Intervention: Stress Management  Activity :  Meditation. LRT played a meditation that focused on letting go of the past and focusing on the present.  Patients were to listen and follow along as the meditation played to engage in activity.  Education:  Stress Management, Discharge Planning.   Education Outcome: Acknowledges Education  Clinical Observations/Feedback: Pt did not attend activity.   Caroll Rancher, LRT/CTRS         Caroll Rancher A 03/05/2019 11:42 AM

## 2019-03-05 NOTE — Progress Notes (Signed)
Psychoeducational Group Note  Date:  03/05/2019 Time:  2126  Group Topic/Focus:  Wrap-Up Group:   The focus of this group is to help patients review their daily goal of treatment and discuss progress on daily workbooks.  Participation Level: Did Not Attend  Participation Quality:  Not Applicable  Affect:  Not Applicable  Cognitive:  Not Applicable  Insight:  Not Applicable  Engagement in Group: Not Applicable  Additional Comments:  The patient did not attend group this evening.   Hazle Coca S 03/05/2019, 9:26 PM

## 2019-03-05 NOTE — Progress Notes (Signed)
Nurse attempted to contact Physical Therapy, phone [phone], pager [phone].

## 2019-03-05 NOTE — Tx Team (Signed)
Interdisciplinary Treatment and Diagnostic Plan Update  03/05/2019 Time of Session: 9:30am Rachel Nolan MRN: [id number]  Principal Diagnosis: Major depressive disorder, recurrent episode, severe ([place])  Secondary Diagnoses: Principal Problem:   Major depressive disorder, recurrent episode, severe ([place]) Active Problems:   MDD (major depressive disorder)   Post traumatic stress disorder (PTSD)   Cocaine use disorder ([place])   Depression   Substance induced mood disorder ([place])   Current Medications:  Current Facility-Administered Medications  Medication Dose Route Frequency Provider Last Rate Last Admin  . acetaminophen (TYLENOL) tablet 650 mg  650 mg Oral Q6H PRN Sharma Covert, MD      . albuterol (VENTOLIN HFA) 108 (90 Base) MCG/ACT inhaler 2 puff  2 puff Inhalation Q6H PRN Mordecai Maes, NP   2 puff at 03/04/19 2103  . alum & mag hydroxide-simeth (MAALOX/MYLANTA) 200-200-20 MG/5ML suspension 30 mL  30 mL Oral Q6H PRN Mordecai Maes, NP      . citalopram (CELEXA) tablet 20 mg  20 mg Oral Daily Lindell Spar I, NP   20 mg at 03/05/19 0755  . folic acid (FOLVITE) tablet 1 mg  1 mg Oral Daily Sharma Covert, MD   1 mg at 03/05/19 0755  . hydrOXYzine (ATARAX/VISTARIL) tablet 25 mg  25 mg Oral Q4H PRN Sharma Covert, MD   25 mg at 03/05/19 0117  . ibuprofen (ADVIL) tablet 600 mg  600 mg Oral Q8H PRN Mordecai Maes, NP   600 mg at 03/04/19 0816  . lisinopril (ZESTRIL) tablet 10 mg  10 mg Oral Daily Lindon Romp A, NP   10 mg at 03/05/19 0756  . LORazepam (ATIVAN) tablet 1 mg  1 mg Oral Q6H PRN Sharma Covert, MD      . nicotine (NICODERM CQ - dosed in mg/24 hours) patch 21 mg  21 mg Transdermal Daily Mordecai Maes, NP   21 mg at 03/05/19 0754  . ondansetron (ZOFRAN) tablet 4 mg  4 mg Oral Q8H PRN Mordecai Maes, NP      . pantoprazole (PROTONIX) EC tablet 40 mg  40 mg Oral Daily Sharma Covert, MD   40 mg at 03/05/19 0755  . prazosin (MINIPRESS) capsule 1 mg   1 mg Oral QHS Nwoko, Agnes I, NP   1 mg at 03/04/19 2103  . thiamine tablet 100 mg  100 mg Oral Daily Sharma Covert, MD   100 mg at 03/05/19 0756  . traZODone (DESYREL) tablet 50 mg  50 mg Oral QHS PRN Sharma Covert, MD   50 mg at 03/05/19 0117   PTA Medications: Medications Prior to Admission  Medication Sig Dispense Refill Last Dose  . albuterol (PROVENTIL HFA;VENTOLIN HFA) 108 (90 BASE) MCG/ACT inhaler Inhale 3 puffs into the lungs 3 (three) times daily. For shortness of breath      . citalopram (CELEXA) 10 MG tablet Take 10 mg by mouth daily.     . cyclobenzaprine (FLEXERIL) 5 MG tablet Take 1-2 tablets (5-10 mg total) by mouth 2 (two) times daily as needed for muscle spasms. (Patient not taking: Reported on 03/01/2019) 24 tablet 0   . ibuprofen (ADVIL) 200 MG tablet Take 600-800 mg by mouth every 6 (six) hours as needed for headache or moderate pain.       Patient Stressors: Financial difficulties Substance abuse Traumatic event  Patient Strengths: Ability for Estate manager/land agent for treatment/growth  Treatment Modalities: Medication Management, Group therapy, Case management,  1 to 1 session with  clinician, Psychoeducation, Recreational therapy.   Physician Treatment Plan for Primary Diagnosis: Major depressive disorder, recurrent episode, severe ([place]) Long Term Goal(s): Improvement in symptoms so as ready for discharge Improvement in symptoms so as ready for discharge   Short Term Goals: Ability to identify changes in lifestyle to reduce recurrence of condition will improve Ability to verbalize feelings will improve Ability to disclose and discuss suicidal ideas Ability to demonstrate self-control will improve Ability to identify and develop effective coping behaviors will improve Ability to maintain clinical measurements within normal limits will improve Compliance with prescribed medications will improve Ability to identify triggers associated  with substance abuse/mental health issues will improve Ability to identify changes in lifestyle to reduce recurrence of condition will improve Ability to verbalize feelings will improve Ability to disclose and discuss suicidal ideas Ability to demonstrate self-control will improve Ability to identify and develop effective coping behaviors will improve Ability to maintain clinical measurements within normal limits will improve Compliance with prescribed medications will improve Ability to identify triggers associated with substance abuse/mental health issues will improve  Medication Management: Evaluate patient's response, side effects, and tolerance of medication regimen.  Therapeutic Interventions: 1 to 1 sessions, Unit Group sessions and Medication administration.  Evaluation of Outcomes: Not Met  Physician Treatment Plan for Secondary Diagnosis: Principal Problem:   Major depressive disorder, recurrent episode, severe (HCC) Active Problems:   MDD (major depressive disorder)   Post traumatic stress disorder (PTSD)   Cocaine use disorder (HCC)   Depression   Substance induced mood disorder ([place])  Long Term Goal(s): Improvement in symptoms so as ready for discharge Improvement in symptoms so as ready for discharge   Short Term Goals: Ability to identify changes in lifestyle to reduce recurrence of condition will improve Ability to verbalize feelings will improve Ability to disclose and discuss suicidal ideas Ability to demonstrate self-control will improve Ability to identify and develop effective coping behaviors will improve Ability to maintain clinical measurements within normal limits will improve Compliance with prescribed medications will improve Ability to identify triggers associated with substance abuse/mental health issues will improve Ability to identify changes in lifestyle to reduce recurrence of condition will improve Ability to verbalize feelings will  improve Ability to disclose and discuss suicidal ideas Ability to demonstrate self-control will improve Ability to identify and develop effective coping behaviors will improve Ability to maintain clinical measurements within normal limits will improve Compliance with prescribed medications will improve Ability to identify triggers associated with substance abuse/mental health issues will improve     Medication Management: Evaluate patient's response, side effects, and tolerance of medication regimen.  Therapeutic Interventions: 1 to 1 sessions, Unit Group sessions and Medication administration.  Evaluation of Outcomes: Not Met   RN Treatment Plan for Primary Diagnosis: Major depressive disorder, recurrent episode, severe ([place]) Long Term Goal(s): Knowledge of disease and therapeutic regimen to maintain health will improve  Short Term Goals: Ability to verbalize frustration and anger appropriately will improve, Ability to participate in decision making will improve, Ability to disclose and discuss suicidal ideas, Ability to identify and develop effective coping behaviors will improve and Compliance with prescribed medications will improve  Medication Management: RN will administer medications as ordered by provider, will assess and evaluate patient's response and provide education to patient for prescribed medication. RN will report any adverse and/or side effects to prescribing provider.  Therapeutic Interventions: 1 on 1 counseling sessions, Psychoeducation, Medication administration, Evaluate responses to treatment, Monitor vital signs and CBGs as ordered, Perform/monitor CIWA,  COWS, AIMS and Fall Risk screenings as ordered, Perform wound care treatments as ordered.  Evaluation of Outcomes: Not Met   LCSW Treatment Plan for Primary Diagnosis: Major depressive disorder, recurrent episode, severe ([place]) Long Term Goal(s): Safe transition to appropriate next level of care at discharge,  Engage patient in therapeutic group addressing interpersonal concerns.  Short Term Goals: Engage patient in aftercare planning with referrals and resources  Therapeutic Interventions: Assess for all discharge needs, 1 to 1 time with Social worker, Explore available resources and support systems, Assess for adequacy in community support network, Educate family and significant other(s) on suicide prevention, Complete Psychosocial Assessment, Interpersonal group therapy.  Evaluation of Outcomes: Not Met   Progress in Treatment: Attending groups: No. Participating in groups: No. Taking medication as prescribed: Yes. Toleration medication: Yes. Family/Significant other contact made: No, will contact:  no one, the patient declined consent Patient understands diagnosis: Yes. Discussing patient identified problems/goals with staff: Yes. Medical problems stabilized or resolved: Yes. Denies suicidal/homicidal ideation: No. Issues/concerns per patient self-inventory: No. Other:   New problem(s) identified: None   New Short Term/Long Term Goal(s):Detox, medication stabilization, elimination of SI thoughts, development of comprehensive mental wellness plan.    Patient Goals: "I want some help. I'm tired"   Discharge Plan or Barriers: Patient is currently homeless. Patient expressed interest in residential treatment at discharge. CSW will continue to follow and assess for appropriate referrals and possible discharge planning.    Reason for Continuation of Hospitalization: Anxiety Depression Hallucinations Medication stabilization Suicidal ideation Withdrawal symptoms  Estimated Length of Stay: 3-5 days   Attendees: Patient: Rachel Nolan  03/05/2019 11:08 AM  Physician: Dr. Myles Lipps, MD 03/05/2019 11:08 AM  Nursing:  03/05/2019 11:08 AM  RN Care Manager: 03/05/2019 11:08 AM  Social Worker: Radonna Ricker, LCSW 03/05/2019 11:08 AM  Recreational Therapist:  03/05/2019 11:08 AM  Other:   03/05/2019 11:08 AM  Other:  03/05/2019 11:08 AM  Other: 03/05/2019 11:08 AM    Scribe for Treatment Team: Marylee Floras, [place] 03/05/2019 11:08 AM

## 2019-03-05 NOTE — Progress Notes (Addendum)
D:  Patient's self inventory sheet, patient has poor sleep, sleep medication helpful.  Good appetite, normal energy level, good concentration.  Rated depression and hopeless #10.  Denied withdrawals.  SI, contracts for safety.  Physical problems, blurred vision, pain, back.  Goal is talk to SW.  No discharge plans. A:  Medications administered per MD orders.  Emotional support and encouragement given patient. R:  Denied HI.  Denied A/V hallucinations. SI, no plan, contracts for safety.  Patient stated sometimes she hears her dad's voice talking to her.  Safety maintained with 15 minute checks.

## 2019-03-05 NOTE — Plan of Care (Signed)
Nurse discussed anxiety, depression and coping skills with patient.  

## 2019-03-05 NOTE — [facility] Group Notes (Signed)
LCSW Group Therapy Notes 03/05/2019 1:46 PM  Type of Therapy and Topic: Group Therapy: Overcoming Obstacles  Participation Level: Did Not Attend  Description of Group:  In this group patients will be encouraged to explore what they see as obstacles to their own wellness and recovery. They will be guided to discuss their thoughts, feelings, and behaviors related to these obstacles. The group will process together ways to cope with barriers, with attention given to specific choices patients can make. Each patient will be challenged to identify changes they are motivated to make in order to overcome their obstacles. This group will be process-oriented, with patients participating in exploration of their own experiences as well as giving and receiving support and challenge from other group members.  Therapeutic Goals: 1. Patient will identify personal and current obstacles as they relate to admission. 2. Patient will identify barriers that currently interfere with their wellness or overcoming obstacles.  3. Patient will identify feelings, thought process and behaviors related to these barriers. 4. Patient will identify two changes they are willing to make to overcome these obstacles:   Summary of Patient Progress  Invited, did not attend.   Therapeutic Modalities:  Cognitive Behavioral Therapy Solution Focused Therapy Motivational Interviewing Relapse Prevention Therapy  Demontez Novack, MSW, LCSWA 03/05/2019 1:46 PM   

## 2019-03-06 MED ORDER — CITALOPRAM HYDROBROMIDE 20 MG PO TABS: 20.0000 mg | ORAL_TABLET | Freq: Every day | ORAL | 0 refills | Status: DC

## 2019-03-06 MED ORDER — NICOTINE 21 MG/24HR TD PT24: 21.0000 mg | MEDICATED_PATCH | Freq: Every day | TRANSDERMAL | 0 refills | Status: DC

## 2019-03-06 MED ORDER — ALBUTEROL SULFATE HFA 108 (90 BASE) MCG/ACT IN AERS: 2.0000 | INHALATION_SPRAY | Freq: Four times a day (QID) | RESPIRATORY_TRACT | 0 refills | Status: DC | PRN

## 2019-03-06 MED ORDER — PANTOPRAZOLE SODIUM 40 MG PO TBEC: 40.0000 mg | DELAYED_RELEASE_TABLET | Freq: Every day | ORAL | 0 refills | Status: DC

## 2019-03-06 MED ORDER — HYDROXYZINE HCL 25 MG PO TABS: 25.0000 mg | ORAL_TABLET | ORAL | 0 refills | Status: DC | PRN

## 2019-03-06 MED ORDER — PRAZOSIN HCL 1 MG PO CAPS: 1.0000 mg | ORAL_CAPSULE | Freq: Every day | ORAL | 0 refills | Status: DC

## 2019-03-06 MED ORDER — LISINOPRIL 10 MG PO TABS: 10.0000 mg | ORAL_TABLET | Freq: Every day | ORAL | 0 refills | Status: DC

## 2019-03-06 MED ORDER — TRAZODONE HCL 50 MG PO TABS: 50.0000 mg | ORAL_TABLET | Freq: Every evening | ORAL | 0 refills | Status: DC | PRN

## 2019-03-06 NOTE — Progress Notes (Signed)
   03/06/19 0040  Psych Admission Type (Psych Patients Only)  Admission Status Involuntary  Psychosocial Assessment  Patient Complaints Depression  Eye Contact Brief  Facial Expression Flat  Affect Depressed  Speech Logical/coherent  Interaction Minimal  Motor Activity Slow;Unsteady  Appearance/Hygiene In hospital gown  Behavior Characteristics Cooperative  Mood Depressed  Aggressive Behavior  Effect No apparent injury  Thought Process  Coherency Circumstantial  Content WDL  Delusions None reported or observed  Perception WDL  Hallucination None reported or observed  Judgment WDL  Confusion None  Danger to Self  Current suicidal ideation? Denies  Self-Injurious Behavior No self-injurious ideation or behavior indicators observed or expressed   Agreement Not to Harm Self Yes  Description of Agreement Verbal  Danger to Others  Danger to Others None reported or observed  D:"I'm depressed all the time. Feels like my medicine is helping me . I will overcome this".   A: Medications administered as prescribed. Support and encouragement provided as needed.  R: Patient remains safe on the unit. Will continue to monitor for safety and stability.

## 2019-03-06 NOTE — Progress Notes (Signed)
Pt continues to spend most of the day in her room.  Comes out of her room to get her meals and make phone calls.  Endorses having SI with no plan.  Tearful when speaking with RN.  Pt is safe on the unit.  RN will continue to monitor.     03/06/19 0945  Psych Admission Type (Psych Patients Only)  Admission Status Involuntary  Psychosocial Assessment  Patient Complaints Anxiety;Crying spells;Depression;Hopelessness;Nervousness;Sadness;Self-harm thoughts;Sleep disturbance;Worthlessness  Eye Contact Brief  Facial Expression Anxious  Affect Depressed;Anxious;Sad  Speech Logical/coherent  Interaction Minimal  Motor Activity Slow;Unsteady  Appearance/Hygiene In hospital gown  Behavior Characteristics Cooperative  Mood Depressed;Anxious  Aggressive Behavior  Effect No apparent injury  Thought Process  Coherency WDL  Content WDL  Delusions None reported or observed  Perception WDL  Hallucination None reported or observed  Judgment WDL  Confusion None  Danger to Self  Current suicidal ideation? Denies  Self-Injurious Behavior No self-injurious ideation or behavior indicators observed or expressed   Agreement Not to Harm Self Yes  Description of Agreement Verbal  Danger to Others  Danger to Others None reported or observed

## 2019-03-06 NOTE — Progress Notes (Signed)
PT Cancellation Note  Patient Details Name: Alvilda Mckenna MRN: [id number] DOB: Aug 26, 1970   Cancelled Treatment:    Reason Eval/Treat Not Completed: Other (comment)PT will be over to evaluate patient at 1:00 per RN request due to patient not sleeping well.    Rada Hay 03/06/2019, 11:40 AM  Blanchard Kelch PT Acute Rehabilitation Services Pager [phone] Office [phone]

## 2019-03-06 NOTE — Progress Notes (Signed)
Pt attended spiritual care group on loss and grief facilitated by Chaplain Burnis Kingfisher, MDiv, B[facility]     Group goal: Support / education around grief.  Identifying grief patterns, feelings / responses to grief, identifying behaviors that may emerge from grief responses, identifying when one may call on an ally or coping skill.  Group Description:  Following introductions and group rules, group opened with psycho-social ed. Group members engaged in facilitated dialog around topic of loss, with particular support around experiences of loss in their lives. Group Identified types of loss (relationships / self / things) and identified patterns, circumstances, and changes that precipitate losses. Reflected on thoughts / feelings around loss, normalized grief responses, and recognized variety in grief experience.  Provided support in facilitated process group.   Group reflected on Worden's tasks of grief.  Group facilitation drew on narrative, and Adlerian modalities, as well as brief cognitive behavioral     Patient progress:  DID NOT ATTEND

## 2019-03-06 NOTE — Progress Notes (Signed)
Patient's ride scheduled for pick up at 7:00am tomorrow morning. Please have patient outside no later than 6:55am so that her ride is not missed.   Drucilla Schmidt, MSW, LCSW-A Clinical Disposition Social Worker [facility]/TTS [phone]

## 2019-03-06 NOTE — Progress Notes (Signed)
Psychoeducational Group Note  Date:  03/06/2019 Time:  2144  Group Topic/Focus:  Wrap-Up Group:   The focus of this group is to help patients review their daily goal of treatment and discuss progress on daily workbooks.  Participation Level: Did Not Attend  Participation Quality:  Not Applicable  Affect:  Not Applicable  Cognitive:  Not Applicable  Insight:  Not Applicable  Engagement in Group: Not Applicable  Additional Comments:  The patient did not attend group this evening and slept in her  Bedroom.   Hazle Coca S 03/06/2019, 9:44 PM
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
## 2019-03-06 NOTE — Evaluation (Addendum)
Physical Therapy Evaluation Patient Details Name: Rachel Nolan MRN: [id number] DOB: 1970-07-17 Today's Date: 03/06/2019   History of Present Illness  Patient is a 49 year old female with a past psychiatric history significant for cocaine dependence, posttraumatic stress disorder and suicidal ideation.  The patient presented on 03/01/2019 to the [facility] emergency department with suicidal ideation. H/O [place] rod placement at age 48-per patient.  Clinical Impression  The patient reports pain moving down left leg, left foot numbness.. Patient reports [place] rods placed when she was 14. Reports diagnosed with sciatica on the left and complains of radiating pain down left leg and left foot numbness.Patient demonstrated techniques that she performs to alleviate  The pain that she experiences in the left leg.  Observed patient's gait with and without AD with noted decreasedstability and safety without. RW. Patient could benefit from a 4 wheeled RW for safe ambulation and allow rest breaks for SOB and increased pain. Reviewed LE exercises and pursed lip breathing and postural exercises. No further PT interventions recommended at this time.PT will sign off. RN and case manager alerted about the 4 wheeled RW.     Follow Up Recommendations No PT follow up    Equipment Recommendations  (4 wheeled RW, necessary for safe ambulation    Recommendations for Other Services       Precautions / Restrictions Precautions Precautions: Fall      Mobility  Bed Mobility Overal bed mobility: Independent             General bed mobility comments: much struggle to roll and push self up, Spine is somewhat rigid  Transfers Overall transfer level: Modified independent Equipment used: Rolling walker (2 wheeled)             General transfer comment: much effort from very low bed, pushed up and pulled up with RW.  Ambulation/Gait Ambulation/Gait assistance: Modified  independent (Device/Increase time) Gait Distance (Feet): 80 Feet Assistive device: Rolling walker (2 wheeled) Gait Pattern/deviations: Step-to pattern;Step-through pattern;Trunk flexed;Decreased stride length;Narrow base of support Gait velocity: decr   General Gait Details: patient tending to lean forward on RW which is set at a high level. Stopped x 2 to rest, noted dyspnea 3/4. Patient did ambulate x 10' withput Rw but is noted to be unsteady.  Stairs            Wheelchair Mobility    Modified Rankin (Stroke Patients Only)       Balance Overall balance assessment: Needs assistance Sitting-balance support: Feet supported;No upper extremity supported Sitting balance-Leahy Scale: Fair Sitting balance - Comments: has difficulty raching feet due to back limits   Standing balance support: During functional activity;No upper extremity supported Standing balance-Leahy Scale: Fair Standing balance comment: static,                             Pertinent Vitals/Pain Pain Assessment: 0-10 Pain Score: 7  Pain Location: left thigh, down to foot Pain Descriptors / Indicators: Cramping;Discomfort;Radiating Pain Intervention(s): Monitored during session;Repositioned    Home Living Family/patient expects to be discharged to:: Unsure Living Arrangements: Other (Comment)               Additional Comments: making efforts for placement in fcility for rehab    Prior Function Level of Independence: Independent with assistive device(s)         Comments: reports had 4 wheeled RW but it was discarded     Hand Dominance  Extremity/Trunk Assessment   Upper Extremity Assessment Upper Extremity Assessment: Overall WFL for tasks assessed    Lower Extremity Assessment Lower Extremity Assessment: LLE deficits/detail RLE Deficits / Details: WFL LLE Deficits / Details: reports foot numbness more in foot, shooting pain down left leg    Cervical / Trunk  Assessment Cervical / Trunk Assessment: Other exceptions Cervical / Trunk Exceptions: unable to sit or stand erect, tends to lean forward, shoulders hunched.Leans on RW  Communication   Communication: No difficulties  Cognition Arousal/Alertness: Awake/alert Behavior During Therapy: WFL for tasks assessed/performed Overall Cognitive Status: Within Functional Limits for tasks assessed                                        General Comments      Exercises Other Exercises Other Exercises: nstructed in seated LAQ, hip flexion, standing hip flexion and abduction while supported. patient reports having performed these in the past Other Exercises: pursed lip breaths when SOB   Assessment/Plan    PT Assessment Patent does not need any further PT services  PT Problem List         PT Treatment Interventions      PT Goals (Current goals can be found in the Care Plan section)  Acute Rehab PT Goals Patient Stated Goal: to be able to walk without pain PT Goal Formulation: All assessment and education complete, DC therapy    Frequency     Barriers to discharge        Co-evaluation               AM-PAC PT "6 Clicks" Mobility  Outcome Measure Help needed turning from your back to your side while in a flat bed without using bedrails?: None Help needed moving from lying on your back to sitting on the side of a flat bed without using bedrails?: None Help needed moving to and from a bed to a chair (including a wheelchair)?: None Help needed standing up from a chair using your arms (e.g., wheelchair or bedside chair)?: None Help needed to walk in hospital room?: None Help needed climbing 3-5 steps with a railing? : A Little 6 Click Score: 23    End of Session   Activity Tolerance: Patient limited by pain Patient left: in bed Nurse Communication: Mobility status PT Visit Diagnosis: Difficulty in walking, not elsewhere classified (R26.2);Unsteadiness on feet  (R26.81);Pain Pain - Right/Left: Left Pain - part of body: Leg    Time: 4128-7867 PT Time Calculation (min) (ACUTE ONLY): 31 min   Charges:   PT Evaluation $PT Eval Low Complexity: 1 Low  gait 8-22        Blanchard Kelch PT Acute Rehabilitation Services Pager [phone] Office [phone]   Rada Hay 03/06/2019, 2:43 PM

## 2019-03-06 NOTE — Progress Notes (Addendum)
TOC CM assisting with RW with seat. Contacted Zack, [employer] rep for RW with seat to be delivered to [facility] office for CM will deliver to [facility]. Sent message to provider to sign. Isidoro Donning RN CCM, [facility] ED TOC CM [phone]
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
## 2019-03-06 NOTE — BHH Counselor (Signed)
CSW left a detailed voicemail for [name] at [employer], patient was evaluated by PT this afternoon and attending physician has placed orders for a 4 wheel walker.  Patient is expected to discharge tomorrow morning at 7am to enter treatment at [facility].  Enid Cutter, MSW, LCSW-A Clinical Social Worker [facility]  [phone]

## 2019-03-06 NOTE — Progress Notes (Signed)
CSW provided rolling walker to patient at bedside.   Drucilla Schmidt, MSW, LCSW-A Clinical Disposition Social Worker [facility]/TTS [phone]

## 2019-03-07 MED ORDER — PANTOPRAZOLE SODIUM 40 MG PO TBEC: 40.0000 mg | DELAYED_RELEASE_TABLET | Freq: Every day | ORAL | 0 refills | Status: DC

## 2019-03-07 MED ORDER — CITALOPRAM HYDROBROMIDE 20 MG PO TABS: 20.0000 mg | ORAL_TABLET | Freq: Every day | ORAL | 0 refills | Status: DC

## 2019-03-07 NOTE — Progress Notes (Signed)
30-day prescriptions for Celexa and Protonix faxed to [facility] this morning.

## 2019-03-07 NOTE — Progress Notes (Signed)
Nurs Dischg Note:  D:Patient stated she was SI AVH- but stated she always feels that way, but stated she would call for help before acting on any feelings.  denies HI at this time. Pt appears calm and cooperative, and no distress noted. Pt escorted to the lobby at 645 to wait for ride to [facility]. Pt was missing prescriptions for Celexa and Protonix, but given instructions to get [facility] to call [facility] to request the prescriptions.   A: All Personal items in locker returned to pt. Pt given samples of medications and prescriptions and reviewed D/C information.  R:  Pt States she will comply with outpatient services, and take MEDS as prescribed.

## 2019-03-07 NOTE — Progress Notes (Signed)
   03/07/19 0100  Psych Admission Type (Psych Patients Only)  Admission Status Involuntary  Psychosocial Assessment  Patient Complaints Anxiety  Eye Contact Brief  Facial Expression Anxious  Affect Depressed;Anxious;Sad  Speech Logical/coherent  Interaction Minimal  Motor Activity Slow;Unsteady  Appearance/Hygiene In hospital gown  Behavior Characteristics Cooperative  Mood Depressed  Aggressive Behavior  Effect No apparent injury  Thought Process  Coherency WDL  Content WDL  Delusions None reported or observed  Perception WDL  Hallucination None reported or observed  Judgment WDL  Confusion None  Danger to Self  Current suicidal ideation? Denies  Self-Injurious Behavior No self-injurious ideation or behavior indicators observed or expressed   Agreement Not to Harm Self Yes  Description of Agreement Verbal  Danger to Others  Danger to Others None reported or observed   Pt stated she did not want to leave tomorrow

## 2019-03-10 ENCOUNTER — Ambulatory Visit (HOSPITAL_COMMUNITY)
Admission: AD | Admit: 2019-03-10 | Discharge: 2019-03-10 | Disposition: A | Discharge status: Home / Self Care | Payer: Federal, State, Local not specified - Other | Attending: Psychiatry | Admitting: Psychiatry

## 2019-03-10 DIAGNOSIS — F17210 Nicotine dependence, cigarettes, uncomplicated: Secondary | ICD-10-CM | POA: Insufficient documentation

## 2019-03-10 DIAGNOSIS — F319 Bipolar disorder, unspecified: Secondary | ICD-10-CM | POA: Insufficient documentation

## 2019-03-10 DIAGNOSIS — F1420 Cocaine dependence, uncomplicated: Secondary | ICD-10-CM | POA: Insufficient documentation

## 2019-03-11 ENCOUNTER — Encounter (HOSPITAL_COMMUNITY): Payer: Self-pay | Admitting: *Deleted

## 2019-03-11 ENCOUNTER — Emergency Department (HOSPITAL_COMMUNITY)
Admission: EM | Admit: 2019-03-11 | Discharge: 2019-03-11 | Disposition: A | Discharge status: Home / Self Care | Payer: Self-pay | Attending: Emergency Medicine | Admitting: Emergency Medicine

## 2019-03-11 ENCOUNTER — Other Ambulatory Visit: Payer: Self-pay

## 2019-03-11 DIAGNOSIS — Z5900 Homelessness unspecified: Secondary | ICD-10-CM

## 2019-03-11 DIAGNOSIS — I10 Essential (primary) hypertension: Secondary | ICD-10-CM | POA: Insufficient documentation

## 2019-03-11 DIAGNOSIS — Z79899 Other long term (current) drug therapy: Secondary | ICD-10-CM | POA: Insufficient documentation

## 2019-03-11 DIAGNOSIS — F32A Depression, unspecified: Secondary | ICD-10-CM

## 2019-03-11 DIAGNOSIS — F17210 Nicotine dependence, cigarettes, uncomplicated: Secondary | ICD-10-CM | POA: Insufficient documentation

## 2019-03-11 DIAGNOSIS — F1410 Cocaine abuse, uncomplicated: Secondary | ICD-10-CM | POA: Insufficient documentation

## 2019-03-11 DIAGNOSIS — Z8616 Personal history of COVID-19: Secondary | ICD-10-CM | POA: Insufficient documentation

## 2019-03-11 DIAGNOSIS — R45851 Suicidal ideations: Secondary | ICD-10-CM | POA: Insufficient documentation

## 2019-03-11 DIAGNOSIS — F1490 Cocaine use, unspecified, uncomplicated: Secondary | ICD-10-CM

## 2019-03-11 DIAGNOSIS — F329 Major depressive disorder, single episode, unspecified: Secondary | ICD-10-CM | POA: Insufficient documentation

## 2019-03-11 MED ORDER — LISINOPRIL 10 MG PO TABS: 10.0000 mg | ORAL_TABLET | Freq: Every day | ORAL | Status: DC

## 2019-03-11 MED ORDER — TRAZODONE HCL 50 MG PO TABS: 50.0000 mg | ORAL_TABLET | Freq: Every evening | ORAL | Status: DC | PRN

## 2019-03-11 MED ORDER — LORAZEPAM 2 MG/ML IJ SOLN
1.0000 mg | Freq: Once | INTRAMUSCULAR | Status: AC
  Administered 2019-03-11: 1 mg via INTRAMUSCULAR
  Filled 2019-03-11: qty 1

## 2019-03-11 MED ORDER — PRAZOSIN HCL 1 MG PO CAPS: 1.0000 mg | ORAL_CAPSULE | Freq: Every day | ORAL | Status: DC

## 2019-03-11 MED ORDER — HYDROXYZINE HCL 25 MG PO TABS: 25.0000 mg | ORAL_TABLET | ORAL | Status: DC | PRN

## 2019-03-11 MED ORDER — PANTOPRAZOLE SODIUM 40 MG PO TBEC: 40.0000 mg | DELAYED_RELEASE_TABLET | Freq: Every day | ORAL | Status: DC

## 2019-03-11 MED ORDER — CITALOPRAM HYDROBROMIDE 10 MG PO TABS: 20.0000 mg | ORAL_TABLET | Freq: Every day | ORAL | Status: DC

## 2019-03-11 MED ORDER — NICOTINE 21 MG/24HR TD PT24: 21.0000 mg | MEDICATED_PATCH | Freq: Every day | TRANSDERMAL | Status: DC

## 2019-03-11 MED ORDER — ALBUTEROL SULFATE HFA 108 (90 BASE) MCG/ACT IN AERS: 2.0000 | INHALATION_SPRAY | Freq: Four times a day (QID) | RESPIRATORY_TRACT | Status: DC | PRN
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
## 2019-03-11 NOTE — ED Triage Notes (Signed)
The pt arrived by [facility] from somewhere outside homeless.  She reports that she was at behavorial  And singed herself out because she thought her daughter was going to pick her up  The gpd had taken her there   She did cocaine tonight and told ems that she was going to kill herself
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
## 2019-07-23 ENCOUNTER — Ambulatory Visit (HOSPITAL_COMMUNITY)
Admission: EM | Admit: 2019-07-23 | Discharge: 2019-07-23 | Disposition: A | Discharge status: Home / Self Care | Payer: Self-pay | Attending: Family Medicine | Admitting: Family Medicine

## 2019-07-23 ENCOUNTER — Encounter (HOSPITAL_COMMUNITY): Payer: Self-pay

## 2019-07-23 DIAGNOSIS — I10 Essential (primary) hypertension: Secondary | ICD-10-CM

## 2019-07-23 MED ORDER — AMLODIPINE BESYLATE 5 MG PO TABS: 5.0000 mg | ORAL_TABLET | Freq: Every day | ORAL | 2 refills | Status: DC

## 2019-07-23 NOTE — ED Triage Notes (Addendum)
Pt presents with concern for elevated bp. Pt went to donate plasma this morning and was found to have an elevated blood pressure. Pt denies any headache or dizziness. Reports blurred vision in her right eye x 1 month. Pt does not have a history of elevated bp and does not have a pcp.  Pt has rx's in her chart that were ordered in February that have notes stating they have not been picked up. This RN will leave them in there so the team is aware what medication she should be on.

## 2019-08-06 NOTE — ED Provider Notes (Signed)
[facility]   [id number] 07/23/19 Arrival Time: 0910  ASSESSMENT & PLAN:  1. Essential hypertension    Would like to begin treatment for HTN.   Meds ordered this encounter  Medications  . amLODipine (NORVASC) 5 MG tablet    Sig: Take 1 tablet (5 mg total) by mouth daily.    Dispense:  90 tablet    Refill:  2    Recommend:  Follow-up Information    Schedule an appointment as soon as possible for a visit  with [facility].   Contact information: [address] [place] [address] [phone]              Reviewed expectations re: course of current medical issues. Questions answered. Outlined signs and symptoms indicating need for more acute intervention. Patient verbalized understanding. After Visit Summary given.   SUBJECTIVE:  Rachel Nolan is a 49 y.o. female who presents with concerns regarding increased blood pressures. She reports that she has not been treated for hypertension in the past.  She reports no chest pain on exertion, no dyspnea on exertion, no swelling of ankles, no orthostatic dizziness or lightheadedness, no orthopnea or paroxysmal nocturnal dyspnea and no palpitations.  Denies symptoms of chest pain, palpations, orthopnea, nocturnal dyspnea, or LE edema.  Social History   Tobacco Use  Smoking Status Current Every Day Smoker  . Types: Cigarettes  Smokeless Tobacco Never Used     ROS: As per HPI.   OBJECTIVE:  Vitals:   07/23/19 1002  BP: (!) 151/90  Pulse: 73  Resp: 19  Temp: 97.9 F (36.6 C)  SpO2: 99%    General appearance: alert; no distress Eyes: PERRLA; EOMI HENT: normocephalic; atraumatic Neck: supple Lungs: clear to auscultation bilaterally Heart: regular rate and rhythm without murmer Extremities: no edema; symmetrical with no gross deformities Skin: warm and dry Psychological: alert and cooperative; normal mood and affect  ECG: Orders placed or  performed during the hospital encounter of 03/01/19  . ED EKG  . ED EKG  . EKG 12-Lead  . EKG 12-Lead  . EKG    Labs: Results for orders placed or performed during the hospital encounter of 03/01/19  Respiratory Panel by RT PCR (Flu A&B, Covid) - Nasopharyngeal Swab   Specimen: Nasopharyngeal Swab  Result Value Ref Range   SARS Coronavirus 2 by RT PCR NEGATIVE NEGATIVE   Influenza A by PCR NEGATIVE NEGATIVE   Influenza B by PCR NEGATIVE NEGATIVE  Comprehensive metabolic panel  Result Value Ref Range   Sodium 139 135 - 145 mmol/L   Potassium 4.2 3.5 - 5.1 mmol/L   Chloride 104 98 - 111 mmol/L   CO2 26 22 - 32 mmol/L   Glucose, Bld 108 (H) 70 - 99 mg/dL   BUN 10 6 - 20 mg/dL   Creatinine, Ser 2.95 0.44 - 1.00 mg/dL   Calcium 8.7 (L) 8.9 - 10.3 mg/dL   Total Protein 6.0 (L) 6.5 - 8.1 g/dL   Albumin 3.3 (L) 3.5 - 5.0 g/dL   AST 18 15 - 41 U/L   ALT 11 0 - 44 U/L   Alkaline Phosphatase 53 38 - 126 U/L   Total Bilirubin 0.3 0.3 - 1.2 mg/dL   GFR calc non Af Amer >60 >60 mL/min   GFR calc Af Amer >60 >60 mL/min   Anion gap 9 5 - 15  Ethanol  Result Value Ref Range   Alcohol, Ethyl (B) <10 <10 mg/dL  Salicylate level  Result Value Ref Range   Salicylate Lvl <7.0 (L) 7.0 - 30.0 mg/dL  Acetaminophen level  Result Value Ref Range   Acetaminophen (Tylenol), Serum <10 (L) 10 - 30 ug/mL  cbc  Result Value Ref Range   WBC 5.5 4.0 - 10.5 K/uL   RBC 4.37 3.87 - 5.11 MIL/uL   Hemoglobin 13.8 12.0 - 15.0 g/dL   HCT 40.9 36 - 46 %   MCV 95.4 80.0 - 100.0 fL   MCH 31.6 26.0 - 34.0 pg   MCHC 33.1 30.0 - 36.0 g/dL   RDW 81.1 91.4 - 78.2 %   Platelets 287 150 - 400 K/uL   nRBC 0.0 0.0 - 0.2 %  Rapid urine drug screen (hospital performed)  Result Value Ref Range   Opiates NONE DETECTED NONE DETECTED   Cocaine POSITIVE (A) NONE DETECTED   Benzodiazepines NONE DETECTED NONE DETECTED   Amphetamines NONE DETECTED NONE DETECTED   Tetrahydrocannabinol NONE DETECTED NONE DETECTED    Barbiturates NONE DETECTED NONE DETECTED  I-Stat beta hCG blood, ED  Result Value Ref Range   I-stat hCG, quantitative <5.0 <5 mIU/mL   Comment 3           Labs Reviewed - No data to display  Imaging: No results found.  No Known Allergies  Past Medical History:  Diagnosis Date  . Asthma   . Scoliosis    Social History   Socioeconomic History  . Marital status: Divorced    Spouse name: Not on file  . Number of children: Not on file  . Years of education: Not on file  . Highest education level: Not on file  Occupational History  . Not on file  Tobacco Use  . Smoking status: Current Every Day Smoker    Types: Cigarettes  . Smokeless tobacco: Never Used  Substance and Sexual Activity  . Alcohol use: Yes  . Drug use: Yes    Types: Marijuana, Cocaine, "Crack" cocaine    Comment: Currently using crack  . Sexual activity: Yes  Other Topics Concern  . Not on file  Social History Narrative   Pt is homeless, no fixed address; not followed by an outpatient psychiatrist   Social Determinants of Health   Financial Resource Strain:   . Difficulty of Paying Living Expenses:   Food Insecurity:   . Worried About Programme researcher, broadcasting/film/video in the Last Year:   . Barista in the Last Year:   Transportation Needs:   . Freight forwarder (Medical):   Marland Kitchen Lack of Transportation (Non-Medical):   Physical Activity:   . Days of Exercise per Week:   . Minutes of Exercise per Session:   Stress:   . Feeling of Stress :   Social Connections:   . Frequency of Communication with Friends and Family:   . Frequency of Social Gatherings with Friends and Family:   . Attends Religious Services:   . Active Member of Clubs or Organizations:   . Attends Banker Meetings:   Marland Kitchen Marital Status:   Intimate Partner Violence:   . Fear of Current or Ex-Partner:   . Emotionally Abused:   Marland Kitchen Physically Abused:   . Sexually Abused:    Family History  Problem Relation Age of Onset  .  Diabetes Mother    Past Surgical History:  Procedure Laterality Date  . BACK SURGERY    . ECTOPIC PREGNANCY SURGERY        Lorene Klimas,  Arlys John, MD 08/06/19 [phone]

## 2019-08-28 ENCOUNTER — Emergency Department (HOSPITAL_COMMUNITY)
Admission: EM | Admit: 2019-08-28 | Discharge: 2019-08-28 | Disposition: A | Discharge status: Home / Self Care | Payer: Self-pay | Attending: Emergency Medicine | Admitting: Emergency Medicine

## 2019-08-28 ENCOUNTER — Other Ambulatory Visit: Payer: Self-pay

## 2019-08-28 ENCOUNTER — Encounter (HOSPITAL_COMMUNITY): Payer: Self-pay | Admitting: Emergency Medicine

## 2019-08-28 ENCOUNTER — Emergency Department (HOSPITAL_COMMUNITY): Payer: Self-pay

## 2019-08-28 ENCOUNTER — Other Ambulatory Visit (HOSPITAL_COMMUNITY): Payer: Self-pay | Admitting: Emergency Medicine

## 2019-08-28 DIAGNOSIS — R531 Weakness: Secondary | ICD-10-CM | POA: Insufficient documentation

## 2019-08-28 DIAGNOSIS — M5410 Radiculopathy, site unspecified: Secondary | ICD-10-CM

## 2019-08-28 DIAGNOSIS — Z79899 Other long term (current) drug therapy: Secondary | ICD-10-CM | POA: Insufficient documentation

## 2019-08-28 DIAGNOSIS — J45909 Unspecified asthma, uncomplicated: Secondary | ICD-10-CM | POA: Insufficient documentation

## 2019-08-28 DIAGNOSIS — M79604 Pain in right leg: Secondary | ICD-10-CM

## 2019-08-28 DIAGNOSIS — M5416 Radiculopathy, lumbar region: Secondary | ICD-10-CM | POA: Insufficient documentation

## 2019-08-28 DIAGNOSIS — M79661 Pain in right lower leg: Secondary | ICD-10-CM | POA: Insufficient documentation

## 2019-08-28 DIAGNOSIS — R200 Anesthesia of skin: Secondary | ICD-10-CM | POA: Insufficient documentation

## 2019-08-28 DIAGNOSIS — R2689 Other abnormalities of gait and mobility: Secondary | ICD-10-CM | POA: Insufficient documentation

## 2019-08-28 DIAGNOSIS — F17210 Nicotine dependence, cigarettes, uncomplicated: Secondary | ICD-10-CM | POA: Insufficient documentation

## 2019-08-28 LAB — CBC
HCT: 39.4 % (ref 36.0–46.0)
Hemoglobin: 12.9 g/dL (ref 12.0–15.0)
MCH: 31.2 pg (ref 26.0–34.0)
MCHC: 32.7 g/dL (ref 30.0–36.0)
MCV: 95.4 fL (ref 80.0–100.0)
Platelets: 232 10*3/uL (ref 150–400)
RBC: 4.13 MIL/uL (ref 3.87–5.11)
RDW: 14.1 % (ref 11.5–15.5)
WBC: 4.1 10*3/uL (ref 4.0–10.5)
nRBC: 0 % (ref 0.0–0.2)

## 2019-08-28 LAB — BASIC METABOLIC PANEL
Anion gap: 8 (ref 5–15)
BUN: 7 mg/dL (ref 6–20)
CO2: 26 mmol/L (ref 22–32)
Calcium: 9 mg/dL (ref 8.9–10.3)
Chloride: 105 mmol/L (ref 98–111)
Creatinine, Ser: 0.53 mg/dL (ref 0.44–1.00)
GFR calc Af Amer: >60 mL/min (ref >60)
GFR calc non Af Amer: >60 mL/min (ref >60)
Glucose, Bld: 90 mg/dL (ref 70–99)
Potassium: 3.3 mmol/L — ABNORMAL LOW (ref 3.5–5.1)
Sodium: 139 mmol/L (ref 135–145)

## 2019-08-28 MED ORDER — ONDANSETRON HCL 4 MG/2ML IJ SOLN
4.0000 mg | Freq: Once | INTRAMUSCULAR | Status: AC
  Administered 2019-08-28: 4 mg via INTRAVENOUS
  Filled 2019-08-28: qty 2

## 2019-08-28 MED ORDER — METHOCARBAMOL 500 MG PO TABS
500.0000 mg | ORAL_TABLET | Freq: Once | ORAL | Status: AC
  Administered 2019-08-28: 500 mg via ORAL
  Filled 2019-08-28: qty 1

## 2019-08-28 MED ORDER — HYDROMORPHONE HCL 1 MG/ML IJ SOLN
0.5000 mg | Freq: Once | INTRAMUSCULAR | Status: AC
  Administered 2019-08-28: 0.5 mg via INTRAVENOUS
  Filled 2019-08-28: qty 1

## 2019-08-28 MED ORDER — METHOCARBAMOL 500 MG PO TABS: 1000.0000 mg | ORAL_TABLET | Freq: Four times a day (QID) | ORAL | 0 refills | Status: DC

## 2019-08-28 MED ORDER — DEXAMETHASONE SODIUM PHOSPHATE 10 MG/ML IJ SOLN
10.0000 mg | Freq: Once | INTRAMUSCULAR | Status: AC
  Administered 2019-08-28: 10 mg via INTRAVENOUS
  Filled 2019-08-28: qty 1

## 2019-08-28 MED ORDER — NAPROXEN 375 MG PO TABS: 375.0000 mg | ORAL_TABLET | Freq: Two times a day (BID) | ORAL | 0 refills | Status: DC

## 2019-08-28 MED ORDER — PREDNISONE 20 MG PO TABS: ORAL_TABLET | ORAL | 0 refills | Status: DC

## 2019-08-28 NOTE — ED Triage Notes (Signed)
Pt reports back pain and tingling and numbness going down her Right leg X3 days.  Does have hx of back surgery and was told she would have to have another.  Unable to walk at all.

## 2019-08-28 NOTE — ED Notes (Signed)
Patient transported to MRI 

## 2019-08-28 NOTE — ED Provider Notes (Signed)
[facility] Provider Note   CSN: [id number] Arrival date & time: 08/28/19  0054     History Chief Complaint  Patient presents with  . Back Pain  . Leg Pain    [facility] How is a 49 y.o. female.  Patient with history of scoliosis status post spine surgery presents the emergency department by EMS with right lower back pain with radiation down into her right leg.  She has associated numbness in her leg and associated weakness.  Patient states that the pain became worse over the past several days.  Yesterday she had to lay on the floor because her leg gave out and she did not have anybody to help her up.  EMS was called and she was transported to the hospital where she has waited in the waiting room.  She states that she is not able to walk.  Patient denies warning symptoms of back pain including: fecal incontinence, urinary retention or overflow incontinence, night sweats, waking from sleep with back pain, unexplained fevers or weight loss, h/o cancer, IVDU, recent trauma.  She denies any treatments for her symptoms prior to arrival.         Past Medical History:  Diagnosis Date  . Asthma   . Scoliosis     Patient Active Problem List   Diagnosis Date Noted  . Cocaine dependence (HCC) 03/05/2019  . Major depressive disorder, recurrent episode, severe (HCC) 03/02/2019  . Post traumatic stress disorder (PTSD) 03/02/2019  . Cocaine use disorder (HCC) 03/02/2019  . Depression 03/02/2019  . Substance induced mood disorder (HCC) 03/02/2019  . MDD (major depressive disorder) 03/01/2019    Past Surgical History:  Procedure Laterality Date  . BACK SURGERY    . ECTOPIC PREGNANCY SURGERY       OB History   No obstetric history on file.     Family History  Problem Relation Age of Onset  . Diabetes Mother     Social History   Tobacco Use  . Smoking status: Current Every Day Smoker    Types: Cigarettes  . Smokeless tobacco: Never  Used  Substance Use Topics  . Alcohol use: Yes  . Drug use: Yes    Types: Marijuana, Cocaine, "Crack" cocaine    Comment: Currently using crack    Home Medications Prior to Admission medications   Medication Sig Start Date End Date Taking? Authorizing Provider  amLODipine (NORVASC) 5 MG tablet Take 1 tablet (5 mg total) by mouth daily. 07/23/19   Mardella Layman, MD  albuterol (VENTOLIN HFA) 108 (90 Base) MCG/ACT inhaler Inhale 2 puffs into the lungs every 6 (six) hours as needed for wheezing or shortness of breath. 03/06/19 07/23/19  Aldean Baker, NP  citalopram (CELEXA) 20 MG tablet Take 1 tablet (20 mg total) by mouth daily. 03/07/19 07/23/19  Aldean Baker, NP  lisinopril (ZESTRIL) 10 MG tablet Take 1 tablet (10 mg total) by mouth daily. 03/07/19 07/23/19  Aldean Baker, NP  pantoprazole (PROTONIX) 40 MG tablet Take 1 tablet (40 mg total) by mouth daily. 03/07/19 07/23/19  Aldean Baker, NP  prazosin (MINIPRESS) 1 MG capsule Take 1 capsule (1 mg total) by mouth at bedtime. 03/06/19 07/23/19  Aldean Baker, NP  traZODone (DESYREL) 50 MG tablet Take 1 tablet (50 mg total) by mouth at bedtime as needed for sleep. 03/06/19 07/23/19  Aldean Baker, NP    Allergies    Patient has no known allergies.  Review of Systems  Review of Systems  Constitutional: Negative for fever and unexpected weight change.  HENT: Negative for rhinorrhea and sore throat.   Eyes: Negative for redness.  Respiratory: Negative for cough.   Cardiovascular: Negative for chest pain.  Gastrointestinal: Negative for abdominal pain, constipation, diarrhea, nausea and vomiting.       Negative for fecal incontinence.   Genitourinary: Negative for dysuria, flank pain, frequency, hematuria, pelvic pain and urgency.       Negative for urinary incontinence or retention.  Musculoskeletal: Positive for back pain and gait problem. Negative for myalgias.  Skin: Negative for rash.  Neurological: Positive for weakness and numbness. Negative  for headaches.       Denies saddle paresthesias.    Physical Exam Updated Vital Signs BP (!) 153/93 (BP Location: Right Arm)   Pulse 78   Temp 97.8 F (36.6 C) (Oral)   Resp 18   Ht 5\' 6"  (1.676 m)   Wt 95.3 kg   LMP 06/19/2018 (Exact Date)   SpO2 99%   BMI 33.91 kg/m   Physical Exam Vitals and nursing note reviewed.  Constitutional:      General: She is not in acute distress.    Appearance: She is well-developed.  HENT:     Head: Normocephalic and atraumatic.     Right Ear: External ear normal.     Left Ear: External ear normal.     Nose: Nose normal.  Eyes:     Conjunctiva/sclera: Conjunctivae normal.  Cardiovascular:     Rate and Rhythm: Normal rate and regular rhythm.     Heart sounds: No murmur heard.   Pulmonary:     Effort: Pulmonary effort is normal. No respiratory distress.     Breath sounds: No wheezing, rhonchi or rales.  Abdominal:     Palpations: Abdomen is soft.     Tenderness: There is no abdominal tenderness. There is no guarding or rebound.  Musculoskeletal:        General: Normal range of motion.     Cervical back: Normal range of motion and neck supple.     Right lower leg: No edema.     Left lower leg: No edema.     Comments: No step-off noted with palpation of spine.   Skin:    General: Skin is warm and dry.     Findings: No rash.  Neurological:     Mental Status: She is alert.     Sensory: Sensory deficit present.     Motor: Weakness present.     Deep Tendon Reflexes: Reflexes are normal and symmetric.     Comments: Lower extremity myotomes tested on left: L2 Hip flexion 5/5 L3 Knee extension 5/5 L4 Ankle dorsiflexion 5/5  S1 Ankle plantar flexion 5/5  Lower extremity myotomes tested on right: L2 Hip flexion 5/5 L3 Knee extension 4/5 L4 Ankle dorsiflexion 2/5  S1 Ankle plantar flexion 2/5  Psychiatric:        Mood and Affect: Mood normal.     ED Results / Procedures / Treatments   Labs (all labs ordered are listed, but only  abnormal results are displayed) Labs Reviewed  BASIC METABOLIC PANEL - Abnormal; Notable for the following components:      Result Value   Potassium 3.3 (*)    All other components within normal limits  CBC    EKG None  Radiology MR LUMBAR SPINE WO CONTRAST  Result Date: 08/28/2019 CLINICAL DATA:  49 year old female with low back pain and progressive right  leg weakness. EXAM: MRI LUMBAR SPINE WITHOUT CONTRAST TECHNIQUE: Multiplanar, multisequence MR imaging of the lumbar spine was performed. No intravenous contrast was administered. COMPARISON:  Lumbar radiographs 09/15/2011. FINDINGS: Segmentation:  Normal on the 2013 radiographs. Alignment: Levoconvex lumbar scoliosis with chronic straightening of lumbar lordosis and lower thoracic kyphosis. Chronic anterolisthesis of L5 on S1 measures 9 mm now and might have progressed since 2013. Vertebrae: Susceptibility artifact from chronic right paraspinal rod which terminates at the L4 lamina. Marrow edema suspected at the L5 and S1 vertebral bodies, appears degenerative in nature. But bone marrow signal elsewhere appears within normal limits. Intact visible sacrum and SI joints. Conus medullaris and cauda equina: Spinal canal obscured at some levels due to metal artifact. The conus appears to terminates at L1-L2. Grossly normal lower thoracic cord and conus signal. Paraspinal and other soft tissues: Negative. Disc levels: Evidence of flowing bilateral posterior element ankylosis from the lower thoracic spine through L4. Normal discs and spinal canal at those levels. L4-L5: Chronic severe L4-L5 posterior element hypertrophy, well demonstrated on the 2013 radiographs also. But these images suggest there is at least right side posterior element ankylosis at L4-L5. Maintained L4-L5 disc space. Thecal sac at this level obscured by artifact, but doubt stenosis. L5-S1: Grade 1 versus mild grade 2 anterolisthesis with severe loss of the disc space. Severe bilateral  facet hypertrophy. No significant spinal stenosis. Moderate left and mild right lateral recess stenosis (S1 nerve levels). Severe left and moderate right L5 foraminal stenosis. IMPRESSION: 1. Chronic scoliosis with right side posterior spinal rod. Flowing posterior element ankylosis from the lower thoracic spine to at least L4, and possibly ankylosed right side L4-L5 posterior elements also. 2. Grade 1 versus mild grade 2 anterolisthesis at L5-S1 with degenerative appearing vertebral marrow edema, and severe degeneration of the disc and facets. Moderate left lateral recess stenosis, mild right lateral recess stenosis, and severe left greater than right foraminal stenosis. Query right L5 and/or S1 radiculitis. Electronically Signed   By: Odessa Fleming M.D.   On: 08/28/2019 19:53    Procedures Procedures (including critical care time)  Medications Ordered in ED Medications  HYDROmorphone (DILAUDID) injection 0.5 mg (0.5 mg Intravenous Given 08/28/19 2025)  ondansetron (ZOFRAN) injection 4 mg (4 mg Intravenous Given 08/28/19 2024)  dexamethasone (DECADRON) injection 10 mg (10 mg Intravenous Given 08/28/19 2025)  methocarbamol (ROBAXIN) tablet 500 mg (500 mg Oral Given 08/28/19 2025)  HYDROmorphone (DILAUDID) injection 0.5 mg (0.5 mg Intravenous Given 08/28/19 2243)    ED Course  I have reviewed the triage vital signs and the nursing notes.  Pertinent labs & imaging results that were available during my care of the patient were reviewed by me and considered in my medical decision making (see chart for details).  Patient seen and examined. Pt is reluctant/has difficulty moving her right leg. She reports significant weakness and numbness in right leg. MRI and treatment ordered.   Vital signs reviewed and are as follows: BP (!) 153/93 (BP Location: Right Arm)   Pulse 78   Temp 97.8 F (36.6 C) (Oral)   Resp 18   Ht 5\' 6"  (1.676 m)   Wt 95.3 kg   LMP 06/19/2018 (Exact Date)   SpO2 99%   BMI 33.91 kg/m    MRI returned prior to labs/treatment. Shows signs of radiculitis, likely cause of symptoms.   Patient initially had difficulty walking after first dose of pain medication.  Second is ordered.  I went and reassessed the patient after she  was moved into the hallway.  I informed her of her MRI results.  She states that she is feeling better now and is asking if she should call her ride.  I told her that if she feels comfortable with discharge, will be happy to discharge. I would just like her to walk prior to d/c. She agrees.   Plan: Prednisone, naproxen, Robaxin.  Patient counseled on proper use of muscle relaxant medication.  They were told not to drink alcohol, drive any vehicle, or do any dangerous activities while taking this medication.  Patient verbalized understanding.  Referral for neurosurgery given.     MDM Rules/Calculators/A&P                          Patient with back pain with radicular features. Patient is more ambulatory after treatment. MRI shows R L5/S1 radiculitis, likely cause of her pain.  No indications for emergent neurosurgical consultation at this time.  No other warning symptoms of back pain including: fecal incontinence, urinary retention or overflow incontinence, night sweats, waking from sleep with back pain, unexplained fevers or weight loss, h/o cancer, IVDU, recent trauma. No concern for cauda equina, epidural abscess, or other emergent cause of back pain. Conservative measures such as rest, ice/heat and pain medicine indicated with PCP follow-up if no improvement with conservative management.    Final Clinical Impression(s) / ED Diagnoses Final diagnoses:  Radiculitis  Pain of right lower extremity    Rx / DC Orders ED Discharge Orders         Ordered    predniSONE (DELTASONE) 20 MG tablet     Discontinue  Reprint     08/28/19 2212    methocarbamol (ROBAXIN) 500 MG tablet  4 times daily     Discontinue  Reprint     08/28/19 2212    naproxen (NAPROSYN)  375 MG tablet  2 times daily     Discontinue  Reprint     08/28/19 2212           Renne CriglerGeiple, Tyliah Schlereth, PA-C 08/28/19 2333    Milagros Lollykstra, Richard S, MD 08/31/19 1444

## 2019-08-28 NOTE — ED Notes (Signed)
Pt able to stand up with assistance, but not able to walk at this time due to pain.

## 2019-08-28 NOTE — Discharge Instructions (Signed)
Please read and follow all provided instructions.  Your diagnoses today include:  1. Radiculitis   2. Pain of right lower extremity     Tests performed today include:  Vital signs - see below for your results today  MRI of your lower back -shows inflammation of the nerves in the lower spine on the right side  Blood counts and electrolytes  Medications prescribed:   Robaxin (methocarbamol) - muscle relaxer medication  DO NOT drive or perform any activities that require you to be awake and alert because this medicine can make you drowsy.    Prednisone - steroid medicine   It is best to take this medication in the morning to prevent sleeping problems. If you are diabetic, monitor your blood sugar closely and stop taking Prednisone if blood sugar is over 300. Take with food to prevent stomach upset.    Naproxen - anti-inflammatory pain medication  Do not exceed 500mg  naproxen every 12 hours, take with food  You have been prescribed an anti-inflammatory medication or NSAID. Take with food. Take smallest effective dose for the shortest duration needed for your pain. Stop taking if you experience stomach pain or vomiting.   Take any prescribed medications only as directed.  Home care instructions:   Follow any educational materials contained in this packet  Please rest, use ice or heat on your back for the next several days  Do not lift, push, pull anything more than 10 pounds for the next week  Follow-up instructions: Please follow-up with your primary care provider or the spine doctor listed in the next 1 week for further evaluation of your symptoms.   Return instructions:  SEEK IMMEDIATE MEDICAL ATTENTION IF YOU HAVE:  New numbness, tingling, weakness, or problem with the use of your arms or legs  Severe back pain not relieved with medications  Loss control of your bowels or bladder  Increasing pain in any areas of the body (such as chest or abdominal  pain)  Shortness of breath, dizziness, or fainting.   Worsening nausea (feeling sick to your stomach), vomiting, fever, or sweats  Any other emergent concerns regarding your health   Additional Information:  Your vital signs today were: BP (!) 153/93 (BP Location: Right Arm)   Pulse 78   Temp 97.8 F (36.6 C) (Oral)   Resp 18   Ht 5\' 6"  (1.676 m)   Wt 95.3 kg   LMP 06/19/2018 (Exact Date)   SpO2 99%   BMI 33.91 kg/m  If your blood pressure (BP) was elevated above 135/85 this visit, please have this repeated by your doctor within one month. --------------

## 2019-08-29 MED FILL — predniSONE 20 MG TABS: 20 | 12 days supply | Qty: 20 | Fill #0

## 2019-08-29 MED FILL — METHOCARBAMOL 500 MG TABS: 500 | 4 days supply | Qty: 30 | Fill #0

## 2019-08-29 MED FILL — NAPROXEN 375 MG TABLET: 375 | 10 days supply | Qty: 20 | Fill #0

## 2019-09-11 ENCOUNTER — Ambulatory Visit: Payer: Self-pay | Attending: Nurse Practitioner | Admitting: Nurse Practitioner

## 2019-09-11 ENCOUNTER — Other Ambulatory Visit: Payer: Self-pay

## 2019-09-11 ENCOUNTER — Encounter: Payer: Self-pay | Admitting: Nurse Practitioner

## 2019-09-11 NOTE — Progress Notes (Signed)
  Virtual Visit via Telephone Note LVM 11:10am  LVM: 11:39 NO answer: 11:57am  I was unable to reach patient.

## 2019-09-21 ENCOUNTER — Other Ambulatory Visit: Payer: Self-pay

## 2019-10-17 ENCOUNTER — Ambulatory Visit: Payer: Self-pay | Admitting: Nurse Practitioner

## 2019-11-26 ENCOUNTER — Ambulatory Visit (HOSPITAL_COMMUNITY)
Admission: EM | Admit: 2019-11-26 | Discharge: 2019-11-26 | Discharge status: Against Medical Advice | Payer: Self-pay | Attending: Family Medicine | Admitting: Family Medicine

## 2019-11-26 ENCOUNTER — Other Ambulatory Visit: Payer: Self-pay

## 2019-11-26 NOTE — ED Notes (Signed)
Attempted to reach patient via phone. Pt called in lobby. X 4 times total.

## 2019-11-26 NOTE — ED Notes (Signed)
TC to Pt at the # left for contact. No answer at the listed number.

## 2019-11-26 NOTE — ED Notes (Signed)
Patient walking in and out of lobby.  Attempted to locate patient but she was down at the hospital parking lot on phone.  Will attempt to call again.

## 2019-11-27 ENCOUNTER — Other Ambulatory Visit: Payer: Self-pay

## 2019-11-27 ENCOUNTER — Encounter (HOSPITAL_COMMUNITY): Payer: Self-pay | Admitting: Emergency Medicine

## 2019-11-27 ENCOUNTER — Other Ambulatory Visit (HOSPITAL_COMMUNITY): Payer: Self-pay | Admitting: Urgent Care

## 2019-11-27 ENCOUNTER — Ambulatory Visit (HOSPITAL_COMMUNITY)
Admission: EM | Admit: 2019-11-27 | Discharge: 2019-11-27 | Disposition: A | Discharge status: Home / Self Care | Payer: Self-pay | Attending: Urgent Care | Admitting: Urgent Care

## 2019-11-27 DIAGNOSIS — M5441 Lumbago with sciatica, right side: Secondary | ICD-10-CM

## 2019-11-27 DIAGNOSIS — M4120 Other idiopathic scoliosis, site unspecified: Secondary | ICD-10-CM

## 2019-11-27 DIAGNOSIS — I10 Essential (primary) hypertension: Secondary | ICD-10-CM

## 2019-11-27 DIAGNOSIS — G8929 Other chronic pain: Secondary | ICD-10-CM

## 2019-11-27 DIAGNOSIS — M5442 Lumbago with sciatica, left side: Secondary | ICD-10-CM

## 2019-11-27 DIAGNOSIS — M5136 Other intervertebral disc degeneration, lumbar region: Secondary | ICD-10-CM

## 2019-11-27 DIAGNOSIS — R030 Elevated blood-pressure reading, without diagnosis of hypertension: Secondary | ICD-10-CM

## 2019-11-27 MED ORDER — TIZANIDINE HCL 4 MG PO TABS: 4.0000 mg | ORAL_TABLET | Freq: Three times a day (TID) | ORAL | 0 refills | Status: DC | PRN

## 2019-11-27 MED ORDER — METHYLPREDNISOLONE ACETATE 80 MG/ML IJ SUSP
INTRAMUSCULAR | Status: AC
  Filled 2019-11-27: qty 1

## 2019-11-27 MED ORDER — AMLODIPINE BESYLATE 5 MG PO TABS: 5.0000 mg | ORAL_TABLET | Freq: Every day | ORAL | 0 refills | Status: DC

## 2019-11-27 MED ORDER — METHYLPREDNISOLONE ACETATE 80 MG/ML IJ SUSP
80.0000 mg | Freq: Once | INTRAMUSCULAR | Status: AC
  Administered 2019-11-27: 80 mg via INTRAMUSCULAR

## 2019-11-27 MED FILL — tiZANidine HCL 4 MG TABS: 4 | 10 days supply | Qty: 30 | Fill #0

## 2019-11-27 MED FILL — AMLODIPINE BESYLATE 5 MG TA: 5 | 30 days supply | Qty: 30 | Fill #0

## 2019-11-27 NOTE — Discharge Instructions (Signed)
Call [facility] to establish care with a new PCP.   For diabetes or elevated blood sugar, please make sure you are avoiding starchy, carbohydrate foods like pasta, breads, pastry, rice, potatoes, desserts. These foods can elevated your blood sugar. Also, avoid sodas, sweet teas, sugary beverages, fruit juices.  Drinking plain water will be much more helpful, try 64 ounces of water daily.  It is okay to flavor your water naturally by cutting cucumber, lemon, mint or lime, placing it in a picture with water and drinking it over a period of 2 to 3 days as long as it remains refrigerated.    For elevated blood pressure, make sure you are monitoring salt in your diet.  Do not eat restaurant foods and limit processed foods at home, prepare/cook your own foods at home.  Processed foods include things like frozen meals preseasoned meats and dinners, deli meats, canned foods as they are high in sodium/salt.  Make sure your pain attention to sodium labels on foods you by at the grocery store.  For seasoning you can use a brand called Mrs. Dash which includes a lot of salt free seasonings.  Salads - kale, spinach, cabbage, spring mix; use seeds like pumpkin seeds or sunflower seeds, almonds, walnuts or pecans; you can also use 1-2 hard boiled eggs in your salads Fruits - avocadoes, berries (blueberries, raspberries, blackberries), apples, oranges, pomegranate, pear; avoid eating bananas, grapes regularly Vegetables - aspargus, cauliflower, broccoli, green beans, brussel spouts, bell peppers; stay away from starchy vegetables like potatoes, carrots, peas  Regarding meat it is better to eat lean meats and limit your red meat including pork to once a week.  Wild caught fish, chicken breast are good options as they tend to be leaner sources of good protein.   DO NOT EAT ANY FOODS ON THIS LIST THAT YOU ARE ALLERGIC TO.

## 2019-11-27 NOTE — ED Triage Notes (Signed)
Pt c/o back pain radiating down to her legs x 2 days. Pt states she has scoliosis and has a rod in her back.

## 2019-11-27 NOTE — ED Provider Notes (Signed)
[facility]   MRN: [id number] DOB: 02-May-1970  Subjective:   [facility] is a 49 y.o. female presenting for 2 day hx of acute on chronic low back pain that radiates into both legs.  Has a remote history of back surgery secondary to scoliosis.  Has recently been having recurrent pains and had an MRI done that showed degenerative disc disease, spinal foraminal stenosis and chronic scoliosis changes.  She also has hypertension, is not taking any of her medication because she ran out.  She would like information about a new PCP.  Denies headache, confusion, chest pain, heart racing or palpitations, diaphoresis, belly pain, incontinence, inability to defecate or urinate.  She is able to walk but with significant pain of her back.  No current facility-administered medications for this encounter.  Current Outpatient Medications:  .  albuterol (VENTOLIN HFA) 108 (90 Base) MCG/ACT inhaler, Inhale into the lungs., Disp: , Rfl:  .  amLODipine (NORVASC) 5 MG tablet, Take 1 tablet (5 mg total) by mouth daily., Disp: 90 tablet, Rfl: 2 .  hydrOXYzine (ATARAX/VISTARIL) 25 MG tablet, Take 25 mg by mouth 3 (three) times daily as needed., Disp: , Rfl:  .  methocarbamol (ROBAXIN) 500 MG tablet, Take 2 tablets (1,000 mg total) by mouth 4 (four) times daily. (Patient not taking: Reported on 09/11/2019), Disp: 30 tablet, Rfl: 0 .  naproxen (NAPROSYN) 375 MG tablet, Take 1 tablet (375 mg total) by mouth 2 (two) times daily. (Patient not taking: Reported on 09/11/2019), Disp: 20 tablet, Rfl: 0 .  predniSONE (DELTASONE) 20 MG tablet, 3 Tabs PO Days 1-3, then 2 tabs PO Days 4-6, then 1 tab PO Day 7-9, then Half Tab PO Day 10-12 (Patient not taking: Reported on 09/11/2019), Disp: 20 tablet, Rfl: 0   No Known Allergies  Past Medical History:  Diagnosis Date  . Anxiety   . Asthma   . Depression   . Hypertension   . Scoliosis      Past Surgical History:  Procedure Laterality Date  . BACK  SURGERY    . ECTOPIC PREGNANCY SURGERY      Family History  Problem Relation Age of Onset  . Hypertension Mother   . Hypertension Father     Social History   Tobacco Use  . Smoking status: Current Every Day Smoker    Types: Cigarettes  . Smokeless tobacco: Never Used  Substance Use Topics  . Alcohol use: Yes  . Drug use: Yes    Types: Marijuana, Cocaine, "Crack" cocaine    Comment: Currently using crack    ROS   Objective:   Vitals: BP (!) 162/103 (BP Location: Left Arm)   Pulse 98   Temp 98.3 F (36.8 C) (Oral)   Resp 14   LMP 06/19/2018 (Exact Date)   SpO2 96%   Physical Exam Constitutional:      General: She is not in acute distress.    Appearance: Normal appearance. She is well-developed. She is not ill-appearing, toxic-appearing or diaphoretic.  HENT:     Head: Normocephalic and atraumatic.     Nose: Nose normal.     Mouth/Throat:     Mouth: Mucous membranes are moist.  Eyes:     Extraocular Movements: Extraocular movements intact.     Pupils: Pupils are equal, round, and reactive to light.  Cardiovascular:     Rate and Rhythm: Normal rate and regular rhythm.     Pulses: Normal pulses.     Heart sounds:  Normal heart sounds. No murmur heard.  No friction rub. No gallop.   Pulmonary:     Effort: Pulmonary effort is normal. No respiratory distress.     Breath sounds: Normal breath sounds. No stridor. No wheezing, rhonchi or rales.  Musculoskeletal:     Lumbar back: Spasms and tenderness (over areas outlined) present. No bony tenderness. Decreased range of motion. Scoliosis present.       Back:  Skin:    General: Skin is warm and dry.     Findings: No rash.  Neurological:     Mental Status: She is alert and oriented to person, place, and time.     Cranial Nerves: No cranial nerve deficit.     Motor: No weakness.     Coordination: Coordination normal.     Gait: Gait normal.     Deep Tendon Reflexes: Reflexes normal.  Psychiatric:        Mood and  Affect: Mood normal.        Behavior: Behavior normal.        Thought Content: Thought content normal.      IMPRESSION: 1. Chronic scoliosis with right side posterior spinal rod. Flowing posterior element ankylosis from the lower thoracic spine to at least L4, and possibly ankylosed right side L4-L5 posterior elements also.  2. Grade 1 versus mild grade 2 anterolisthesis at L5-S1 with degenerative appearing vertebral marrow edema, and severe degeneration of the disc and facets. Moderate left lateral recess stenosis, mild right lateral recess stenosis, and severe left greater than right foraminal stenosis. Query right L5 and/or S1 radiculitis.   Electronically Signed   By: Odessa Fleming M.D.   On: 08/28/2019 19:53  Assessment and Plan :   I have reviewed the PDMP during this encounter.  1. Chronic bilateral low back pain with bilateral sciatica   2. Other idiopathic scoliosis, unspecified spinal region   3. Degenerative disc disease, lumbar   4. Essential hypertension   5. Elevated blood pressure reading     Acute on chronic low back pain.  Recommended IM Depo-Medrol.  Patient does not have diabetes as shown by recent labs.  Recommended scheduling Tylenol, tizanidine long-term.  Follow-up with neuro spine specialist as she has not had follow-up in 20 years for this per patient.  Restart amlodipine, refill provided.  Establish care with a new PCP, information provided to the patient. Counseled patient on potential for adverse effects with medications prescribed/recommended today, ER and return-to-clinic precautions discussed, patient verbalized understanding.    Wallis Bamberg, PA-C 11/27/19 1658

## 2019-11-28 ENCOUNTER — Inpatient Hospital Stay (HOSPITAL_COMMUNITY)
Admission: EM | Admit: 2019-11-28 | Discharge: 2019-12-05 | DRG: 897 | Payer: Self-pay | Attending: Family Medicine | Admitting: Family Medicine

## 2019-11-28 ENCOUNTER — Other Ambulatory Visit: Payer: Self-pay

## 2019-11-28 DIAGNOSIS — F1410 Cocaine abuse, uncomplicated: Secondary | ICD-10-CM | POA: Diagnosis present

## 2019-11-28 DIAGNOSIS — R45850 Homicidal ideations: Secondary | ICD-10-CM | POA: Diagnosis present

## 2019-11-28 DIAGNOSIS — F32A Depression, unspecified: Secondary | ICD-10-CM

## 2019-11-28 DIAGNOSIS — F17210 Nicotine dependence, cigarettes, uncomplicated: Secondary | ICD-10-CM | POA: Diagnosis present

## 2019-11-28 DIAGNOSIS — Z79899 Other long term (current) drug therapy: Secondary | ICD-10-CM

## 2019-11-28 DIAGNOSIS — F1994 Other psychoactive substance use, unspecified with psychoactive substance-induced mood disorder: Secondary | ICD-10-CM

## 2019-11-28 DIAGNOSIS — F4310 Post-traumatic stress disorder, unspecified: Secondary | ICD-10-CM

## 2019-11-28 DIAGNOSIS — F1420 Cocaine dependence, uncomplicated: Secondary | ICD-10-CM | POA: Diagnosis present

## 2019-11-28 DIAGNOSIS — K5900 Constipation, unspecified: Secondary | ICD-10-CM | POA: Diagnosis present

## 2019-11-28 DIAGNOSIS — Z8249 Family history of ischemic heart disease and other diseases of the circulatory system: Secondary | ICD-10-CM

## 2019-11-28 DIAGNOSIS — F1424 Cocaine dependence with cocaine-induced mood disorder: Secondary | ICD-10-CM | POA: Diagnosis present

## 2019-11-28 DIAGNOSIS — R440 Auditory hallucinations: Secondary | ICD-10-CM | POA: Diagnosis present

## 2019-11-28 DIAGNOSIS — I10 Essential (primary) hypertension: Secondary | ICD-10-CM | POA: Diagnosis present

## 2019-11-28 DIAGNOSIS — Y900 Blood alcohol level of less than 20 mg/100 ml: Secondary | ICD-10-CM | POA: Diagnosis present

## 2019-11-28 DIAGNOSIS — G8929 Other chronic pain: Secondary | ICD-10-CM | POA: Diagnosis present

## 2019-11-28 DIAGNOSIS — F1429 Cocaine dependence with unspecified cocaine-induced disorder: Secondary | ICD-10-CM

## 2019-11-28 DIAGNOSIS — F419 Anxiety disorder, unspecified: Secondary | ICD-10-CM | POA: Diagnosis present

## 2019-11-28 DIAGNOSIS — F10239 Alcohol dependence with withdrawal, unspecified: Principal | ICD-10-CM

## 2019-11-28 DIAGNOSIS — J45909 Unspecified asthma, uncomplicated: Secondary | ICD-10-CM | POA: Diagnosis present

## 2019-11-28 DIAGNOSIS — F10939 Alcohol use, unspecified with withdrawal, unspecified: Secondary | ICD-10-CM | POA: Diagnosis present

## 2019-11-28 DIAGNOSIS — Z20822 Contact with and (suspected) exposure to covid-19: Secondary | ICD-10-CM | POA: Diagnosis present

## 2019-11-28 DIAGNOSIS — M419 Scoliosis, unspecified: Secondary | ICD-10-CM | POA: Diagnosis present

## 2019-11-28 DIAGNOSIS — Z9119 Patient's noncompliance with other medical treatment and regimen: Secondary | ICD-10-CM

## 2019-11-28 DIAGNOSIS — R45851 Suicidal ideations: Secondary | ICD-10-CM

## 2019-11-28 DIAGNOSIS — G4700 Insomnia, unspecified: Secondary | ICD-10-CM | POA: Diagnosis present

## 2019-11-28 DIAGNOSIS — F329 Major depressive disorder, single episode, unspecified: Secondary | ICD-10-CM | POA: Diagnosis present

## 2019-11-28 LAB — COMPREHENSIVE METABOLIC PANEL
ALT: 12 U/L (ref 0–44)
ALT: 9 U/L (ref 0–44)
AST: 17 U/L (ref 15–41)
AST: 14 U/L — ABNORMAL LOW (ref 15–41)
Albumin: 3.6 g/dL (ref 3.5–5.0)
Albumin: 2.9 g/dL — ABNORMAL LOW (ref 3.5–5.0)
Alkaline Phosphatase: 62 U/L (ref 38–126)
Alkaline Phosphatase: 50 U/L (ref 38–126)
Anion gap: 9 (ref 5–15)
Anion gap: 9 (ref 5–15)
BUN: 19 mg/dL (ref 6–20)
BUN: 19 mg/dL (ref 6–20)
CO2: 26 mmol/L (ref 22–32)
CO2: 20 mmol/L — ABNORMAL LOW (ref 22–32)
Calcium: 9.1 mg/dL (ref 8.9–10.3)
Calcium: 8.4 mg/dL — ABNORMAL LOW (ref 8.9–10.3)
Chloride: 107 mmol/L (ref 98–111)
Chloride: 113 mmol/L — ABNORMAL HIGH (ref 98–111)
Creatinine, Ser: 0.94 mg/dL (ref 0.44–1.00)
Creatinine, Ser: 0.69 mg/dL (ref 0.44–1.00)
GFR, Estimated: >60 mL/min (ref >60)
GFR, Estimated: >60 mL/min (ref >60)
Glucose, Bld: 126 mg/dL — ABNORMAL HIGH (ref 70–99)
Glucose, Bld: 126 mg/dL — ABNORMAL HIGH (ref 70–99)
Potassium: 3.8 mmol/L (ref 3.5–5.1)
Potassium: 3.8 mmol/L (ref 3.5–5.1)
Sodium: 142 mmol/L (ref 135–145)
Sodium: 142 mmol/L (ref 135–145)
Total Bilirubin: 0.3 mg/dL (ref 0.3–1.2)
Total Bilirubin: 0.5 mg/dL (ref 0.3–1.2)
Total Protein: 6.7 g/dL (ref 6.5–8.1)
Total Protein: 5.8 g/dL — ABNORMAL LOW (ref 6.5–8.1)

## 2019-11-28 LAB — CBC
HCT: 40.9 % (ref 36.0–46.0)
HCT: 36.2 % (ref 36.0–46.0)
Hemoglobin: 13.2 g/dL (ref 12.0–15.0)
Hemoglobin: 11.6 g/dL — ABNORMAL LOW (ref 12.0–15.0)
MCH: 31.6 pg (ref 26.0–34.0)
MCH: 32.1 pg (ref 26.0–34.0)
MCHC: 32.3 g/dL (ref 30.0–36.0)
MCHC: 32 g/dL (ref 30.0–36.0)
MCV: 97.8 fL (ref 80.0–100.0)
MCV: 100.3 fL — ABNORMAL HIGH (ref 80.0–100.0)
Platelets: 281 10*3/uL (ref 150–400)
Platelets: 229 10*3/uL (ref 150–400)
RBC: 4.18 MIL/uL (ref 3.87–5.11)
RBC: 3.61 MIL/uL — ABNORMAL LOW (ref 3.87–5.11)
RDW: 15.3 % (ref 11.5–15.5)
RDW: 15.5 % (ref 11.5–15.5)
WBC: 6.7 10*3/uL (ref 4.0–10.5)
WBC: 4.9 10*3/uL (ref 4.0–10.5)
nRBC: 0 % (ref 0.0–0.2)
nRBC: 0 % (ref 0.0–0.2)

## 2019-11-28 LAB — HIV ANTIBODY (ROUTINE TESTING W REFLEX): HIV Screen 4th Generation wRfx: NONREACTIVE

## 2019-11-28 LAB — ACETAMINOPHEN LEVEL: Acetaminophen (Tylenol), Serum: <10 ug/mL — ABNORMAL LOW (ref 10–30)

## 2019-11-28 LAB — RESPIRATORY PANEL BY RT PCR (FLU A&B, COVID)
Influenza A by PCR: NEGATIVE
Influenza B by PCR: NEGATIVE
SARS Coronavirus 2 by RT PCR: NEGATIVE

## 2019-11-28 LAB — PHOSPHORUS: Phosphorus: 3.5 mg/dL (ref 2.5–4.6)

## 2019-11-28 LAB — MAGNESIUM: Magnesium: 2 mg/dL (ref 1.7–2.4)

## 2019-11-28 LAB — SALICYLATE LEVEL: Salicylate Lvl: <7 mg/dL — ABNORMAL LOW (ref 7.0–30.0)

## 2019-11-28 LAB — ETHANOL: Alcohol, Ethyl (B): <10 mg/dL (ref <10)

## 2019-11-28 MED ORDER — SODIUM CHLORIDE 0.9 % IV BOLUS
1000.0000 mL | Freq: Once | INTRAVENOUS | Status: AC
  Administered 2019-11-28: 1000 mL via INTRAVENOUS

## 2019-11-28 MED ORDER — ADULT MULTIVITAMIN W/MINERALS CH
1.0000 | ORAL_TABLET | Freq: Every day | ORAL | Status: DC
  Administered 2019-11-28 – 2019-12-05 (×8): 1 via ORAL
  Filled 2019-11-28 (×8): qty 1

## 2019-11-28 MED ORDER — FOLIC ACID 1 MG PO TABS
1.0000 mg | ORAL_TABLET | Freq: Every day | ORAL | Status: DC
  Administered 2019-11-28 – 2019-12-05 (×8): 1 mg via ORAL
  Filled 2019-11-28 (×8): qty 1

## 2019-11-28 MED ORDER — LORAZEPAM 1 MG PO TABS
1.0000 mg | ORAL_TABLET | ORAL | Status: DC | PRN
  Administered 2019-11-29 (×2): 2 mg via ORAL
  Administered 2019-11-30: 1 mg via ORAL
  Filled 2019-11-28: qty 1
  Filled 2019-11-28 (×3): qty 2

## 2019-11-28 MED ORDER — LORAZEPAM 1 MG PO TABS
0.0000 mg | ORAL_TABLET | Freq: Two times a day (BID) | ORAL | Status: AC
  Filled 2019-11-28: qty 1

## 2019-11-28 MED ORDER — ONDANSETRON HCL 4 MG/2ML IJ SOLN: 4.0000 mg | Freq: Four times a day (QID) | INTRAMUSCULAR | Status: DC | PRN

## 2019-11-28 MED ORDER — THIAMINE HCL 100 MG/ML IJ SOLN: 100.0000 mg | Freq: Every day | INTRAMUSCULAR | Status: DC

## 2019-11-28 MED ORDER — LORAZEPAM 2 MG/ML IJ SOLN
0.0000 mg | Freq: Four times a day (QID) | INTRAMUSCULAR | Status: AC
  Administered 2019-11-29: 2 mg via INTRAVENOUS

## 2019-11-28 MED ORDER — LORAZEPAM 2 MG/ML IJ SOLN
1.0000 mg | INTRAMUSCULAR | Status: DC | PRN
  Administered 2019-11-28 – 2019-11-29 (×2): 1 mg via INTRAVENOUS
  Filled 2019-11-28 (×4): qty 1

## 2019-11-28 MED ORDER — TIZANIDINE HCL 4 MG PO TABS
4.0000 mg | ORAL_TABLET | Freq: Three times a day (TID) | ORAL | Status: DC | PRN
  Administered 2019-12-01 – 2019-12-02 (×2): 4 mg via ORAL
  Filled 2019-11-28 (×3): qty 1

## 2019-11-28 MED ORDER — LORAZEPAM 2 MG/ML IJ SOLN: 0.0000 mg | Freq: Two times a day (BID) | INTRAMUSCULAR | Status: AC

## 2019-11-28 MED ORDER — ACETAMINOPHEN 325 MG PO TABS
650.0000 mg | ORAL_TABLET | Freq: Four times a day (QID) | ORAL | Status: DC | PRN
  Administered 2019-12-01 – 2019-12-02 (×2): 650 mg via ORAL
  Filled 2019-11-28 (×2): qty 2

## 2019-11-28 MED ORDER — DEXTROSE IN LACTATED RINGERS 5 % IV SOLN: INTRAVENOUS | Status: DC

## 2019-11-28 MED ORDER — THIAMINE HCL 100 MG PO TABS
100.0000 mg | ORAL_TABLET | Freq: Every day | ORAL | Status: DC
  Administered 2019-11-28 – 2019-12-05 (×8): 100 mg via ORAL
  Filled 2019-11-28 (×8): qty 1

## 2019-11-28 MED ORDER — PANTOPRAZOLE SODIUM 40 MG PO TBEC
40.0000 mg | DELAYED_RELEASE_TABLET | Freq: Every day | ORAL | Status: DC
  Administered 2019-11-28 – 2019-12-05 (×8): 40 mg via ORAL
  Filled 2019-11-28 (×8): qty 1

## 2019-11-28 MED ORDER — THIAMINE HCL 100 MG PO TABS: 100.0000 mg | ORAL_TABLET | Freq: Every day | ORAL | Status: DC

## 2019-11-28 MED ORDER — LORAZEPAM 1 MG PO TABS
0.0000 mg | ORAL_TABLET | Freq: Four times a day (QID) | ORAL | Status: AC
  Administered 2019-11-28: 4 mg via ORAL
  Administered 2019-11-30: 1 mg via ORAL
  Filled 2019-11-28: qty 4
  Filled 2019-11-28: qty 1

## 2019-11-28 MED ORDER — ENOXAPARIN SODIUM 40 MG/0.4ML ~~LOC~~ SOLN
40.0000 mg | SUBCUTANEOUS | Status: DC
  Administered 2019-11-28 – 2019-12-04 (×4): 40 mg via SUBCUTANEOUS
  Filled 2019-11-28 (×6): qty 0.4

## 2019-11-28 MED ORDER — OXYCODONE-ACETAMINOPHEN 5-325 MG PO TABS
1.0000 | ORAL_TABLET | ORAL | Status: DC | PRN
  Administered 2019-11-30 – 2019-12-05 (×8): 1 via ORAL
  Filled 2019-11-28 (×9): qty 1

## 2019-11-28 NOTE — H&P (Signed)
History and Physical    Rachel Nolan CXK:[id number] DOB: 1970-04-25 DOA: 11/28/2019  PCP: Claiborne Rigg, NP   Patient coming from: Home   Chief Complaint: tremors, anxiety and suicidal ideation.   HPI: Rachel Nolan is a 49 y.o. female with medical history significant of anxiety, asthma, depression, hypertension and scoliosis.  She has been drinking alcohol daily, she reported hallucinations and severe tremors.  Last drink 48 hours prior to hospitalization.  Positive crack cocaine consumption at home, last used 2 days ago.  Anxiety and tremors have been severe in intensity, no improving or worsening factors, associated with poor oral intake and hallucinations.  Persistent over the last 48 hours.  Patient was seen yesterday at urgent care for chronic bilateral low back pain with bilateral sciatica.  She was prescribed acetaminophen and steroids.  Today at triage she reported suicidal and homicidal ideation for 2 days, apparently one of her family members recently was murder. She reports not having a establish home.  ED Course: Patient with severe tremors, tearful and anxious.  Her CIWA score was 24.  She received benzodiazepines per CIWA protocol, thiamine and 1 L of normal saline.  Referred for admission for evaluation.  Review of Systems: Limited due to poor interaction. Unable to assess 10 point, but denies any nausea or vomiting, no fevers no chills, no chest pain.  Positive back pain, worse with movement.  Past Medical History:  Diagnosis Date  . Anxiety   . Asthma   . Depression   . Hypertension   . Scoliosis     Past Surgical History:  Procedure Laterality Date  . BACK SURGERY    . ECTOPIC PREGNANCY SURGERY       reports that she has been smoking cigarettes. She has never used smokeless tobacco. She reports current alcohol use. She reports current drug use. Drugs: Marijuana, Cocaine, and "Crack" cocaine.  No Known Allergies  Family History  Problem Relation  Age of Onset  . Hypertension Mother   . Hypertension Father      Prior to Admission medications   Medication Sig Start Date End Date Taking? Authorizing Provider  albuterol (VENTOLIN HFA) 108 (90 Base) MCG/ACT inhaler Inhale 2 puffs into the lungs every 6 (six) hours as needed for wheezing or shortness of breath.  03/06/19  Yes [provider]  amLODipine (NORVASC) 5 MG tablet Take 1 tablet (5 mg total) by mouth daily. 11/27/19   Wallis Bamberg, PA-C  tiZANidine (ZANAFLEX) 4 MG tablet Take 1 tablet (4 mg total) by mouth every 8 (eight) hours as needed. 11/27/19   Wallis Bamberg, PA-C  citalopram (CELEXA) 20 MG tablet Take 1 tablet (20 mg total) by mouth daily. 03/07/19 07/23/19  Aldean Baker, NP  lisinopril (ZESTRIL) 10 MG tablet Take 1 tablet (10 mg total) by mouth daily. 03/07/19 09/11/19  Aldean Baker, NP  pantoprazole (PROTONIX) 40 MG tablet Take 1 tablet (40 mg total) by mouth daily. 03/07/19 07/23/19  Aldean Baker, NP  prazosin (MINIPRESS) 1 MG capsule Take 1 capsule (1 mg total) by mouth at bedtime. 03/06/19 09/11/19  Aldean Baker, NP  traZODone (DESYREL) 50 MG tablet Take 1 tablet (50 mg total) by mouth at bedtime as needed for sleep. 03/06/19 09/11/19  Aldean Baker, NP    Physical Exam: Vitals:   11/28/19 1530 11/28/19 1545 11/28/19 1600 11/28/19 1704  BP: (!) 161/95 (!) 144/108 (!) 139/93 (!) 153/102  Pulse: 68 87 82 71  Resp: (!) 22 (!) 23 (!)  27 (!) 23  Temp:      TempSrc:      SpO2: 98% 99% 100% 98%    Vitals:   11/28/19 1530 11/28/19 1545 11/28/19 1600 11/28/19 1704  BP: (!) 161/95 (!) 144/108 (!) 139/93 (!) 153/102  Pulse: 68 87 82 71  Resp: (!) 22 (!) 23 (!) 27 (!) 23  Temp:      TempSrc:      SpO2: 98% 99% 100% 98%   General: deconditioned  Neurology: somnolent but easy to arouse, answers to simple questions and follows commands. Positive resting tremors but no asterixis.  Head and Neck. Head normocephalic. Neck supple with no adenopathy or thyromegaly.   E  ENT: no pallor, no icterus, oral mucosa dry.  Cardiovascular: No JVD. S1-S2 present, rhythmic, no gallops, rubs, or murmurs. No lower extremity edema. Pulmonary: positive breath sounds bilaterally, with wheezing, rhonchi or rales. Gastrointestinal. Abdomen soft and non tender Skin. No rashes Musculoskeletal: no joint deformities    Labs on Admission: I have personally reviewed following labs and imaging studies  CBC: Recent Labs  Lab 11/28/19 1317  WBC 6.7  HGB 13.2  HCT 40.9  MCV 97.8  PLT 281   Basic Metabolic Panel: Recent Labs  Lab 11/28/19 1317  NA 142  K 3.8  CL 107  CO2 26  GLUCOSE 126*  BUN 19  CREATININE 0.94  CALCIUM 9.1   GFR: CrCl cannot be calculated (Unknown ideal weight.). Liver Function Tests: Recent Labs  Lab 11/28/19 1317  AST 17  ALT 12  ALKPHOS 62  BILITOT 0.3  PROT 6.7  ALBUMIN 3.6   No results for input(s): LIPASE, AMYLASE in the last 168 hours. No results for input(s): AMMONIA in the last 168 hours. Coagulation Profile: No results for input(s): INR, PROTIME in the last 168 hours. Cardiac Enzymes: No results for input(s): CKTOTAL, CKMB, CKMBINDEX, TROPONINI in the last 168 hours. BNP (last 3 results) No results for input(s): PROBNP in the last 8760 hours. HbA1C: No results for input(s): HGBA1C in the last 72 hours. CBG: No results for input(s): GLUCAP in the last 168 hours. Lipid Profile: No results for input(s): CHOL, HDL, LDLCALC, TRIG, CHOLHDL, LDLDIRECT in the last 72 hours. Thyroid Function Tests: No results for input(s): TSH, T4TOTAL, FREET4, T3FREE, THYROIDAB in the last 72 hours. Anemia Panel: No results for input(s): VITAMINB12, FOLATE, FERRITIN, TIBC, IRON, RETICCTPCT in the last 72 hours. Urine analysis: No results found for: COLORURINE, APPEARANCEUR, LABSPEC, PHURINE, GLUCOSEU, HGBUR, BILIRUBINUR, KETONESUR, PROTEINUR, UROBILINOGEN, NITRITE, LEUKOCYTESUR  Radiological Exams on Admission: No results found.  EKG:  Independently reviewed. NA  Assessment/Plan Principal Problem:   Alcohol withdrawal (HCC) Active Problems:   MDD (major depressive disorder)   Post traumatic stress disorder (PTSD)   Cocaine use disorder (HCC)   Depression   Substance induced mood disorder (HCC)   Cocaine dependence (HCC)   Suicidal ideation   49 year old female with multiple psychiatric disorders, including substance abuse and alcohol consumption who presents with suicidal/homicidal ideations.  She was found in severe withdrawals from alcohol.  On her initial physical examination temperature 97.7, blood pressure 147/100, heart rate 93, respiratory rate 16, oxygen saturation 98% on room air.  She had dry mucous membranes, lungs clear to auscultation, heart S1-S2, present rhythmic, abdomen soft, bowel extremity edema, she had positive tremors, somnolent at the time of my examination after receiving benzodiazepines. Sodium 142, potassium 3.8, chloride 107, bicarb 26, glucose 126, BUN 19, creatinine 0.94, white count 6.7, hemoglobin 13.2, hematocrit  40.9, platelets 281.  Acetaminophen less than 10, salicylate less than 7.  SARS COVID-19 negative.  Rachel Nolan will be admitted to the hospital with a working diagnosis of acute alcohol withdrawal, complicated with suicidal/homicidal ideations.  1.  Acute alcohol withdrawal syndrome.  Admit patient to medical telemetry, continue benzodiazepines per CIWA protocol.  Hydration with dextrose with balance electrolyte solutions at 100 mL/h.  Continue neurochecks per unit protocol.  Add thiamine and multivitamins. Antiacid therapy with pantoprazole and as needed antiemetics.   2.  Depression/suicidal/homicidal.  One-to-one precautions, once withdrawal control, consult psychiatry.  3.  Cocaine abuse.  Continue benzodiazepines per protocol.  4. Chronic back pain. Continue pain control with oral hydrocodone and acetaminophen.   Status is: Inpatient  Remains inpatient appropriate  because:IV treatments appropriate due to intensity of illness or inability to take PO   Dispo: The patient is from: Home              Anticipated d/c is to: Home              Anticipated d/c date is: 3 days              Patient currently is not medically stable to d/c.   DVT prophylaxis: Enoxaparin   Code Status:   full  Family Communication:  No family at the bedside     Consults called:  None   Admission status:   Inpatient   Adiba Fargnoli Annett Gula MD [facility]   11/28/2019, 5:06 PM

## 2019-11-28 NOTE — Progress Notes (Signed)
CSW met with Pt at bedside. Pt used her personal cell phone to contact her daughter, Tawanna Cooler @ [phone] While on speaker phone, Ms. Jimmye Norman reports that Pt has 3 children, 51 y/o, 42 y/o and 26 y/o.   Ms. Jimmye Norman states that the 38 y/o, Stephania Fragmin is with her in her home in [place], [place].

## 2019-11-28 NOTE — Social Work (Signed)
CSW attempted to speak with Pt at bedside to gather information regarding whereabouts/saftey of minor children.  Pt somnolent and did not answer questions.

## 2019-11-28 NOTE — ED Triage Notes (Signed)
Pt reports SI and HI with AVH x 2 days. Sts one of her family members was recently murdered and is now hearing the gunshots over and over in her head. Hx of depression but is not taking medication at this time. Calm, cooperative, and tearful in triage.

## 2019-11-28 NOTE — ED Provider Notes (Signed)
Care assumed from [facility], [place]. See her note for full H&P.  Per her note, "Rachel Nolan is a 49 y.o. female with PMHx HTN, depression, and anxiety who presents to the ED today with complaint of suicidal and homicidal ideation. Pt reports that she has been hearing the gunshots that she heard when her family member was killed outside of a club on 10/08. She states that this has brought everything to a head and made her relive old trauma as well. This has caused her to want to "burn" herself and her two small children. She states that her sister came to pick up her children earlier today and take them to [place] where she lives. Pt had her significant other bring her here - she is here voluntarily. She does endorse that she typically drinks 2-3 40 ounces of beer per day and last drank 2 days ago. She also last smoked crack cocaine 2 days ago. Pt reports she is here seeking help - she was admitted to the [facility] in the past and feels this will benefit her.   The history is provided by the patient and medical records. "  Physical Exam  BP (!) 134/92   Pulse 80   Temp 98 F (36.7 C) (Oral)   Resp 15   LMP 06/19/2018 (Exact Date)   SpO2 99%   Physical Exam Vitals and nursing note reviewed.  Constitutional:      General: She is not in acute distress.    Appearance: She is well-developed.  HENT:     Head: Normocephalic and atraumatic.  Eyes:     Conjunctiva/sclera: Conjunctivae normal.  Cardiovascular:     Rate and Rhythm: Normal rate and regular rhythm.  Pulmonary:     Effort: Pulmonary effort is normal.  Musculoskeletal:        General: Normal range of motion.     Cervical back: Neck supple.  Skin:    General: Skin is warm and dry.  Neurological:     Mental Status: She is alert.       ED Course/Procedures     Procedures  Results for orders placed or performed during the hospital encounter of 11/28/19  Respiratory Panel by RT PCR (Flu A&B, Covid) - Nasopharyngeal  Swab   Specimen: Nasopharyngeal Swab  Result Value Ref Range   SARS Coronavirus 2 by RT PCR NEGATIVE NEGATIVE   Influenza A by PCR NEGATIVE NEGATIVE   Influenza B by PCR NEGATIVE NEGATIVE  Comprehensive metabolic panel  Result Value Ref Range   Sodium 142 135 - 145 mmol/L   Potassium 3.8 3.5 - 5.1 mmol/L   Chloride 107 98 - 111 mmol/L   CO2 26 22 - 32 mmol/L   Glucose, Bld 126 (H) 70 - 99 mg/dL   BUN 19 6 - 20 mg/dL   Creatinine, Ser 1.44 0.44 - 1.00 mg/dL   Calcium 9.1 8.9 - 31.5 mg/dL   Total Protein 6.7 6.5 - 8.1 g/dL   Albumin 3.6 3.5 - 5.0 g/dL   AST 17 15 - 41 U/L   ALT 12 0 - 44 U/L   Alkaline Phosphatase 62 38 - 126 U/L   Total Bilirubin 0.3 0.3 - 1.2 mg/dL   GFR, Estimated >40 >08 mL/min   Anion gap 9 5 - 15  Ethanol  Result Value Ref Range   Alcohol, Ethyl (B) <10 <10 mg/dL  Salicylate level  Result Value Ref Range   Salicylate Lvl <7.0 (L) 7.0 - 30.0 mg/dL  Acetaminophen level  Result Value Ref Range   Acetaminophen (Tylenol), Serum <10 (L) 10 - 30 ug/mL  cbc  Result Value Ref Range   WBC 6.7 4.0 - 10.5 K/uL   RBC 4.18 3.87 - 5.11 MIL/uL   Hemoglobin 13.2 12.0 - 15.0 g/dL   HCT 45.8 36 - 46 %   MCV 97.8 80.0 - 100.0 fL   MCH 31.6 26.0 - 34.0 pg   MCHC 32.3 30.0 - 36.0 g/dL   RDW 09.9 83.3 - 82.5 %   Platelets 281 150 - 400 K/uL   nRBC 0.0 0.0 - 0.2 %   No results found.   MDM   Briefly, 49 y/o F presenting for SI/HI. H/o ETOH and drug use. Last drink 2 days ago.  Reviewed/interpreted labs CBC, CMP, etoh, salicylate, acetaminophen level, are negative UDS and COVID pending on admisison.   Initial CIWA 24. Pt given ativan, will admit for ETOH withdrawal. She will need eval by psych during admission.   4:00 PM CONSULT With Dr. Ella Jubilee who accepts patient for admission.     Karrie Meres, PA-C 11/28/19 1756    Jacalyn Lefevre, MD 11/28/19 1845

## 2019-11-28 NOTE — ED Provider Notes (Signed)
[facility] EMERGENCY DEPARTMENT Provider Note   CSN: [id number] Arrival date & time: 11/28/19  1304     History Chief Complaint  Patient presents with  . Suicidal  . Homicidal    Rachel Nolan is a 49 y.o. female with PMHx HTN, depression, and anxiety who presents to the ED today with complaint of suicidal and homicidal ideation. Pt reports that she has been hearing the gunshots that she heard when her family member was killed outside of a club on 10/08. She states that this has brought everything to a head and made her relive old trauma as well. This has caused her to want to "burn" herself and her two small children. She states that her sister came to pick up her children earlier today and take them to [place] where she lives. Pt had her significant other bring her here - she is here voluntarily. She does endorse that she typically drinks 2-3 40 ounces of beer per day and last drank 2 days ago. She also last smoked crack cocaine 2 days ago. Pt reports she is here seeking help - she was admitted to the [facility] in the past and feels this will benefit her.   The history is provided by the patient and medical records.       Past Medical History:  Diagnosis Date  . Anxiety   . Asthma   . Depression   . Hypertension   . Scoliosis     Patient Active Problem List   Diagnosis Date Noted  . Cocaine dependence (HCC) 03/05/2019  . Major depressive disorder, recurrent episode, severe (HCC) 03/02/2019  . Post traumatic stress disorder (PTSD) 03/02/2019  . Cocaine use disorder (HCC) 03/02/2019  . Depression 03/02/2019  . Substance induced mood disorder (HCC) 03/02/2019  . MDD (major depressive disorder) 03/01/2019    Past Surgical History:  Procedure Laterality Date  . BACK SURGERY    . ECTOPIC PREGNANCY SURGERY       OB History   No obstetric history on file.     Family History  Problem Relation Age of Onset  . Hypertension Mother   . Hypertension  Father     Social History   Tobacco Use  . Smoking status: Current Every Day Smoker    Types: Cigarettes  . Smokeless tobacco: Never Used  Substance Use Topics  . Alcohol use: Yes  . Drug use: Yes    Types: Marijuana, Cocaine, "Crack" cocaine    Comment: Currently using crack    Home Medications Prior to Admission medications   Medication Sig Start Date End Date Taking? Authorizing Provider  albuterol (VENTOLIN HFA) 108 (90 Base) MCG/ACT inhaler Inhale into the lungs. 03/06/19   [provider]  amLODipine (NORVASC) 5 MG tablet Take 1 tablet (5 mg total) by mouth daily. 11/27/19   Wallis Bamberg, PA-C  hydrOXYzine (ATARAX/VISTARIL) 25 MG tablet Take 25 mg by mouth 3 (three) times daily as needed.    [provider]  methocarbamol (ROBAXIN) 500 MG tablet Take 2 tablets (1,000 mg total) by mouth 4 (four) times daily. Patient not taking: Reported on 09/11/2019 08/28/19   Renne Crigler, PA-C  naproxen (NAPROSYN) 375 MG tablet Take 1 tablet (375 mg total) by mouth 2 (two) times daily. Patient not taking: Reported on 09/11/2019 08/28/19   Renne Crigler, PA-C  predniSONE (DELTASONE) 20 MG tablet 3 Tabs PO Days 1-3, then 2 tabs PO Days 4-6, then 1 tab PO Day 7-9, then Half Tab PO  Day 10-12 Patient not taking: Reported on 09/11/2019 08/28/19   Renne Crigler, PA-C  tiZANidine (ZANAFLEX) 4 MG tablet Take 1 tablet (4 mg total) by mouth every 8 (eight) hours as needed. 11/27/19   Wallis Bamberg, PA-C  citalopram (CELEXA) 20 MG tablet Take 1 tablet (20 mg total) by mouth daily. 03/07/19 07/23/19  Aldean Baker, NP  lisinopril (ZESTRIL) 10 MG tablet Take 1 tablet (10 mg total) by mouth daily. 03/07/19 09/11/19  Aldean Baker, NP  pantoprazole (PROTONIX) 40 MG tablet Take 1 tablet (40 mg total) by mouth daily. 03/07/19 07/23/19  Aldean Baker, NP  prazosin (MINIPRESS) 1 MG capsule Take 1 capsule (1 mg total) by mouth at bedtime. 03/06/19 09/11/19  Aldean Baker, NP  traZODone (DESYREL) 50 MG tablet  Take 1 tablet (50 mg total) by mouth at bedtime as needed for sleep. 03/06/19 09/11/19  Aldean Baker, NP    Allergies    Patient has no known allergies.  Review of Systems   Review of Systems  Constitutional: Negative for chills and fever.  Psychiatric/Behavioral: Positive for hallucinations and suicidal ideas. The patient is nervous/anxious.   All other systems reviewed and are negative.   Physical Exam Updated Vital Signs BP (!) 147/100 (BP Location: Right Arm)   Pulse 93   Temp 97.7 F (36.5 C) (Oral)   Resp 16   LMP 06/19/2018 (Exact Date)   SpO2 98%   Physical Exam Vitals and nursing note reviewed.  Constitutional:      Appearance: She is not ill-appearing.     Comments: Tremulous and tearful on exam  HENT:     Head: Normocephalic and atraumatic.  Eyes:     Conjunctiva/sclera: Conjunctivae normal.  Cardiovascular:     Rate and Rhythm: Normal rate and regular rhythm.     Pulses: Normal pulses.  Pulmonary:     Effort: Pulmonary effort is normal.     Breath sounds: Normal breath sounds. No wheezing, rhonchi or rales.  Abdominal:     Palpations: Abdomen is soft.     Tenderness: There is no abdominal tenderness. There is no guarding or rebound.  Musculoskeletal:     Cervical back: Neck supple.  Skin:    General: Skin is warm and dry.  Neurological:     Mental Status: She is alert and oriented to person, place, and time.  Psychiatric:        Mood and Affect: Affect is tearful.        Behavior: Behavior is withdrawn.        Thought Content: Thought content includes homicidal and suicidal ideation. Thought content includes homicidal and suicidal plan.     ED Results / Procedures / Treatments   Labs (all labs ordered are listed, but only abnormal results are displayed) Labs Reviewed  COMPREHENSIVE METABOLIC PANEL - Abnormal; Notable for the following components:      Result Value   Glucose, Bld 126 (*)    All other components within normal limits  SALICYLATE  LEVEL - Abnormal; Notable for the following components:   Salicylate Lvl <7.0 (*)    All other components within normal limits  ACETAMINOPHEN LEVEL - Abnormal; Notable for the following components:   Acetaminophen (Tylenol), Serum <10 (*)    All other components within normal limits  RESPIRATORY PANEL BY RT PCR (FLU A&B, COVID)  ETHANOL  CBC  RAPID URINE DRUG SCREEN, HOSP PERFORMED    EKG None  Radiology No results found.  Procedures Procedures (  including critical care time)  Medications Ordered in ED Medications  LORazepam (ATIVAN) injection 0-4 mg ( Intravenous See Alternative 11/28/19 1443)    Or  LORazepam (ATIVAN) tablet 0-4 mg (4 mg Oral Given 11/28/19 1443)  LORazepam (ATIVAN) injection 0-4 mg (has no administration in time range)    Or  LORazepam (ATIVAN) tablet 0-4 mg (has no administration in time range)  thiamine tablet 100 mg (has no administration in time range)    Or  thiamine (B-1) injection 100 mg (has no administration in time range)  sodium chloride 0.9 % bolus 1,000 mL (1,000 mLs Intravenous New Bag/Given 11/28/19 1451)    ED Course  I have reviewed the triage vital signs and the nursing notes.  Pertinent labs & imaging results that were available during my care of the patient were reviewed by me and considered in my medical decision making (see chart for details).    MDM Rules/Calculators/A&P                          49 year old female presenting to the ED with SI and HI as well as auditory hallucinations for the past few days. Pt has been under a significant amount of stress after a family member was murdered last month. This has caused her to replay the sounds in her head and has been making her feel like she no longer wants to live. Pt is here seeking help after she began feeling homicidal towards her 2 small children. She reports her sister took them today to be with her in [facility] while pt seeks help. On arrival to the ED VSS however pt appears  tremulous and tearful on exam. She reports she last drank EtOH 2 days ago and last smoked crack cocaine 2 days ago; may be in withdrawal. Will obtain CIWA. Pt currently here voluntarily without other physical complaitns. Will medically clear and then consult TTS.   CIWA score of 24. Have placed on CIWA protocol. May require admission for alcohol withdrawal.   At shift change case signed out to [facility], PA-C, who will reevaluate patient and dispo accordingly.   Final Clinical Impression(s) / ED Diagnoses Final diagnoses:  None    Rx / DC Orders ED Discharge Orders    None       Tanda Rockers, PA-C 11/28/19 1532    Tegeler, Canary Brim, MD 11/28/19 [phone]

## 2019-11-28 NOTE — ED Notes (Addendum)
Staffing was called for a sitter. Staffing stated that, there were no sitters at this time. I advised the RN of the situation so that she is aware.

## 2019-11-28 NOTE — BHH Counselor (Signed)
Per hospital note:  Pt reports SI and HI with AVH x 2 days. Sts one of her family members was recently murdered and is now hearing the gunshots over and over in her head. Hx of depression but is not taking medication at this time.  Requested telepsych cart put in.  However, Pt's CIWAA score is high and per attending PA-C, Pt requires attention before evaluation.  Hospital staff to contact TTS when Pt is ready.

## 2019-11-29 LAB — BASIC METABOLIC PANEL
Anion gap: 8 (ref 5–15)
BUN: 15 mg/dL (ref 6–20)
CO2: 24 mmol/L (ref 22–32)
Calcium: 8.8 mg/dL — ABNORMAL LOW (ref 8.9–10.3)
Chloride: 110 mmol/L (ref 98–111)
Creatinine, Ser: 0.96 mg/dL (ref 0.44–1.00)
GFR, Estimated: >60 mL/min (ref >60)
Glucose, Bld: 98 mg/dL (ref 70–99)
Potassium: 3.8 mmol/L (ref 3.5–5.1)
Sodium: 142 mmol/L (ref 135–145)

## 2019-11-29 LAB — CBC
HCT: 35.2 % — ABNORMAL LOW (ref 36.0–46.0)
Hemoglobin: 11.6 g/dL — ABNORMAL LOW (ref 12.0–15.0)
MCH: 32.2 pg (ref 26.0–34.0)
MCHC: 33 g/dL (ref 30.0–36.0)
MCV: 97.8 fL (ref 80.0–100.0)
Platelets: 223 10*3/uL (ref 150–400)
RBC: 3.6 MIL/uL — ABNORMAL LOW (ref 3.87–5.11)
RDW: 15.2 % (ref 11.5–15.5)
WBC: 5.3 10*3/uL (ref 4.0–10.5)
nRBC: 0 % (ref 0.0–0.2)

## 2019-11-29 LAB — RAPID URINE DRUG SCREEN, HOSP PERFORMED
Amphetamines: NOT DETECTED
Barbiturates: NOT DETECTED
Benzodiazepines: POSITIVE — AB
Cocaine: POSITIVE — AB
Opiates: NOT DETECTED
Tetrahydrocannabinol: NOT DETECTED

## 2019-11-29 MED ORDER — GUAIFENESIN 100 MG/5ML PO SOLN
5.0000 mL | ORAL | Status: DC | PRN
  Administered 2019-11-29 – 2019-12-04 (×2): 100 mg via ORAL
  Filled 2019-11-29 (×2): qty 5

## 2019-11-29 NOTE — Consult Note (Signed)
  Rachel Nolan is a 49 year old female who presented to the ED with suicidal thoughts, depression, hallucinations, and worsening alcohol withdraw symptoms. She is being medically admitted at this time for CIWA scores greater than 24. Her BAL on admission was negative. UDS positive for Benzodiazapine and cocaine. Due to recent suicidal ideations, and previous psychiatric history and homicidal ideations to harm her children. She meets inpatient criteria once she is medically stable.  -will recommend working with disposition social worker to get patient placed in inpatient psychiatric facility.

## 2019-11-29 NOTE — ED Notes (Signed)
Patient sleeping at this time. Cardiac, bp, and pulse ox monitoring in place. 1:1 sitter at bedside. Safety precautions maintained. Will continue to monitor.

## 2019-11-29 NOTE — Plan of Care (Signed)
  Problem: Education: Goal: Knowledge of disease or condition will improve Outcome: Progressing Goal: Understanding of discharge needs will improve Outcome: Progressing   

## 2019-11-29 NOTE — Progress Notes (Signed)
PROGRESS NOTE    Rachel Nolan  PJA:[id number] DOB: 1970/06/11 DOA: 11/28/2019 PCP: Claiborne Rigg, NP   Chief Complaint  Patient presents with  . Suicidal  . Homicidal   Brief Narrative: 49 year old female with history of anxiety/depression, asthma, hypertension, scoliosis alcohol use comes to the ED with hallucination, severe tremor, back pain.  In the ED patient reported suicidal and homicidal ideation for 2 days.  As per report She has been drinking alcohol daily, she reported hallucinations and severe tremors. Last drink 48 hours prior to hospitalization.  Positive crack cocaine consumption at home, last used 2 days ago. Anxiety and tremors have been severe in intensity, no improving or worsening factors, associated with poor oral intake and hallucinations.  Persistent over the last 48 hours. Patient was seen AT urgent care 11/9 for chronic bilateral low back pain with bilateral sciatica.  She was prescribed acetaminophen and steroids. In ED she reported suicidal and homicidal ideation for 2 days, apparently one of her family members recently was murderED. She reports not having a establish home. In ED her CIWA score was 24- received benzodiazepines per CIWA protocol, thiamine and 1 L of normal saline and was admitted.  Subjective:  Afebrile overnight blood pressure hypertensive 130s to 160s, heart rate in 50s to 70s, on room air. Labs with a stable electrolytes and CBC. c/o ongoing auditory hallucination suicidal ideation. One to one in place. Somewhat sleepy, got ativan thi am- able to wake up and interact.  Assessment & Plan:s  Alcoholism with acute alcohol withdrawal syndrome: c/o ongoing auditory hallucination suicidal ideation. Continue on benzodiazepine per CIWA protocol, IV hydration, thiamine folate Protonix and as needed Zofran.  Monitor neurochecks and monitoring telemetry.  Depression with suicidal and homicidal ideation continue one-to-one precaution, psychiatry  consult  Substance abuse cocaine abuse continue benzodiazepine per protocol.  Drug screen positive for cocaine and benzodiazepine.  Chronic back pain was recently seen in urgent care center continue hydrocodone Tylenol.  She had recent MRI 08/28/2102 lumbar spine that showed chronic scoliosis with right-sided posterior spinal rod, grade 1 versus mild grade 2 anterolisthesis at L5-S1 with degenerative changes and stenosis.  Nutrition: Diet Order            Diet regular Room service appropriate? Yes; Fluid consistency: Thin  Diet effective now                There is no height or weight on file to calculate BMI.  DVT prophylaxis: enoxaparin (LOVENOX) injection 40 mg Start: 11/28/19 2200 SCDs Start: 11/28/19 1750 Code Status:   Code Status: Full Code  Family Communication: plan of care discussed with patient at bedside.  Status is: Inpatient Remains inpatient appropriate because:IV treatments appropriate due to intensity of illness or inability to take PO and Inpatient level of care appropriate due to severity of illness  Dispo: The patient is from: Home              Anticipated d/c is to: TBD              Anticipated d/c date is: 3 days              Patient currently is not medically stable to d/c.  Consultants:see note  Procedures:see note  Culture/Microbiology No results found for: SDES, SPECREQUEST, CULT, REPTSTATUS  Other culture-see note  Medications: Scheduled Meds: . enoxaparin (LOVENOX) injection  40 mg Subcutaneous Q24H  . folic acid  1 mg Oral Daily  . LORazepam  0-4 mg Intravenous  Q6H   Or  . LORazepam  0-4 mg Oral Q6H  . [START ON 11/30/2019] LORazepam  0-4 mg Intravenous Q12H   Or  . [START ON 11/30/2019] LORazepam  0-4 mg Oral Q12H  . multivitamin with minerals  1 tablet Oral Daily  . pantoprazole  40 mg Oral Daily  . thiamine  100 mg Oral Daily   Or  . thiamine  100 mg Intravenous Daily  . thiamine  100 mg Oral Daily   Or  . thiamine  100 mg  Intravenous Daily   Continuous Infusions: . dextrose 5% lactated ringers 75 mL/hr at 11/28/19 1904    Antimicrobials: Anti-infectives (From admission, onward)   None     Objective: Vitals: Today's Vitals   11/29/19 0530 11/29/19 0545 11/29/19 0645 11/29/19 0650  BP: 128/81 128/74 (!) 160/96 (!) 160/96  Pulse: 65 65 82 70  Resp:   (!) 28   Temp:      TempSrc:      SpO2: 98% 98% 99%   PainSc:        Intake/Output Summary (Last 24 hours) at 11/29/2019 4967 Last data filed at 11/28/2019 1703 Gross per 24 hour  Intake 1000 ml  Output --  Net 1000 ml   There were no vitals filed for this visit. Weight change:   Intake/Output from previous day: 11/10 0701 - 11/11 0700 In: 1000 [IV Piggyback:1000] Out: -  Intake/Output this shift: No intake/output data recorded.  Examination: General exam: AAOx3,NAD, weak appearing. HEENT:Oral mucosa moist, Ear/Nose WNL grossly,dentition normal. Respiratory system: bilaterally clear,no wheezing or crackles,no use of accessory muscle, non tender. Cardiovascular system: S1 & S2 +, regular, No JVD. Gastrointestinal system: Abdomen soft, NT,ND, BS+. Nervous System:Alert, awake,  Tremulous, moving extremities and grossly nonfocal Extremities: No edema, distal peripheral pulses palpable.  Skin: No rashes,no icterus. MSK: Normal muscle bulk,tone, power  Data Reviewed: I have personally reviewed following labs and imaging studies CBC: Recent Labs  Lab 11/28/19 1317 11/28/19 2006 11/29/19 0247  WBC 6.7 4.9 5.3  HGB 13.2 11.6* 11.6*  HCT 40.9 36.2 35.2*  MCV 97.8 100.3* 97.8  PLT 281 229 223   Basic Metabolic Panel: Recent Labs  Lab 11/28/19 1317 11/28/19 2006 11/29/19 0247  NA 142 142 142  K 3.8 3.8 3.8  CL 107 113* 110  CO2 26 20* 24  GLUCOSE 126* 126* 98  BUN 19 19 15   CREATININE 0.94 0.69 0.96  CALCIUM 9.1 8.4* 8.8*  MG  --  2.0  --   PHOS  --  3.5  --    GFR: CrCl cannot be calculated (Unknown ideal  weight.). Liver Function Tests: Recent Labs  Lab 11/28/19 1317 11/28/19 2006  AST 17 14*  ALT 12 9  ALKPHOS 62 50  BILITOT 0.3 0.5  PROT 6.7 5.8*  ALBUMIN 3.6 2.9*   No results for input(s): LIPASE, AMYLASE in the last 168 hours. No results for input(s): AMMONIA in the last 168 hours. Coagulation Profile: No results for input(s): INR, PROTIME in the last 168 hours. Cardiac Enzymes: No results for input(s): CKTOTAL, CKMB, CKMBINDEX, TROPONINI in the last 168 hours. BNP (last 3 results) No results for input(s): PROBNP in the last 8760 hours. HbA1C: No results for input(s): HGBA1C in the last 72 hours. CBG: No results for input(s): GLUCAP in the last 168 hours. Lipid Profile: No results for input(s): CHOL, HDL, LDLCALC, TRIG, CHOLHDL, LDLDIRECT in the last 72 hours. Thyroid Function Tests: No results for input(s): TSH, T4TOTAL,  FREET4, T3FREE, THYROIDAB in the last 72 hours. Anemia Panel: No results for input(s): VITAMINB12, FOLATE, FERRITIN, TIBC, IRON, RETICCTPCT in the last 72 hours. Sepsis Labs: No results for input(s): PROCALCITON, LATICACIDVEN in the last 168 hours.  Recent Results (from the past 240 hour(s))  Respiratory Panel by RT PCR (Flu A&B, Covid) - Nasopharyngeal Swab     Status: None   Collection Time: 11/28/19  3:41 PM   Specimen: Nasopharyngeal Swab  Result Value Ref Range Status   SARS Coronavirus 2 by RT PCR NEGATIVE NEGATIVE Final    Comment: (NOTE) SARS-CoV-2 target nucleic acids are NOT DETECTED.  The SARS-CoV-2 RNA is generally detectable in upper respiratoy specimens during the acute phase of infection. The lowest concentration of SARS-CoV-2 viral copies this assay can detect is 131 copies/mL. A negative result does not preclude SARS-Cov-2 infection and should not be used as the sole basis for treatment or other patient management decisions. A negative result may occur with  improper specimen collection/handling, submission of specimen other than  nasopharyngeal swab, presence of viral mutation(s) within the areas targeted by this assay, and inadequate number of viral copies (<131 copies/mL). A negative result must be combined with clinical observations, patient history, and epidemiological information. The expected result is Negative.  Fact Sheet for Patients:  https://www.moore.com/  Fact Sheet for Healthcare Providers:  https://www.young.biz/  This test is no t yet approved or cleared by the [place] FDA and  has been authorized for detection and/or diagnosis of SARS-CoV-2 by FDA under an Emergency Use Authorization (EUA). This EUA will remain  in effect (meaning this test can be used) for the duration of the COVID-19 declaration under Section 564(b)(1) of the Act, 21 U.S.C. section 360bbb-3(b)(1), unless the authorization is terminated or revoked sooner.     Influenza A by PCR NEGATIVE NEGATIVE Final   Influenza B by PCR NEGATIVE NEGATIVE Final    Comment: (NOTE) The Xpert Xpress SARS-CoV-2/FLU/RSV assay is intended as an aid in  the diagnosis of influenza from Nasopharyngeal swab specimens and  should not be used as a sole basis for treatment. Nasal washings and  aspirates are unacceptable for Xpert Xpress SARS-CoV-2/FLU/RSV  testing.  Fact Sheet for Patients: https://www.moore.com/  Fact Sheet for Healthcare Providers: https://www.young.biz/  This test is not yet approved or cleared by the [place] FDA and  has been authorized for detection and/or diagnosis of SARS-CoV-2 by  FDA under an Emergency Use Authorization (EUA). This EUA will remain  in effect (meaning this test can be used) for the duration of the  Covid-19 declaration under Section 564(b)(1) of the Act, 21  U.S.C. section 360bbb-3(b)(1), unless the authorization is  terminated or revoked. Performed at [facility], [address] [place],  [place] [address]      Radiology Studies: No results found.   LOS: 1 day   Lanae Boast, MD [facility]  11/29/2019, 8:22 AM

## 2019-11-29 NOTE — ED Notes (Signed)
Lunch Tray Ordered @ 1034. 

## 2019-11-30 LAB — BASIC METABOLIC PANEL
Anion gap: 7 (ref 5–15)
BUN: 12 mg/dL (ref 6–20)
CO2: 26 mmol/L (ref 22–32)
Calcium: 9 mg/dL (ref 8.9–10.3)
Chloride: 107 mmol/L (ref 98–111)
Creatinine, Ser: 0.73 mg/dL (ref 0.44–1.00)
GFR, Estimated: >60 mL/min (ref >60)
Glucose, Bld: 101 mg/dL — ABNORMAL HIGH (ref 70–99)
Potassium: 3.5 mmol/L (ref 3.5–5.1)
Sodium: 140 mmol/L (ref 135–145)

## 2019-11-30 LAB — CBC
HCT: 34.7 % — ABNORMAL LOW (ref 36.0–46.0)
Hemoglobin: 11.7 g/dL — ABNORMAL LOW (ref 12.0–15.0)
MCH: 32.3 pg (ref 26.0–34.0)
MCHC: 33.7 g/dL (ref 30.0–36.0)
MCV: 95.9 fL (ref 80.0–100.0)
Platelets: 211 10*3/uL (ref 150–400)
RBC: 3.62 MIL/uL — ABNORMAL LOW (ref 3.87–5.11)
RDW: 14.6 % (ref 11.5–15.5)
WBC: 5.9 10*3/uL (ref 4.0–10.5)
nRBC: 0 % (ref 0.0–0.2)

## 2019-11-30 MED ORDER — LORAZEPAM 2 MG/ML IJ SOLN
2.0000 mg | Freq: Once | INTRAMUSCULAR | Status: AC
  Administered 2019-12-01: 2 mg via INTRAMUSCULAR
  Filled 2019-11-30: qty 1

## 2019-11-30 MED ORDER — ALBUTEROL SULFATE HFA 108 (90 BASE) MCG/ACT IN AERS
2.0000 | INHALATION_SPRAY | Freq: Four times a day (QID) | RESPIRATORY_TRACT | Status: DC | PRN
  Administered 2019-12-01 – 2019-12-02 (×2): 2 via RESPIRATORY_TRACT
  Filled 2019-11-30: qty 6.7

## 2019-11-30 MED ORDER — HALOPERIDOL LACTATE 5 MG/ML IJ SOLN
5.0000 mg | Freq: Once | INTRAMUSCULAR | Status: AC
  Administered 2019-11-30: 5 mg via INTRAMUSCULAR
  Filled 2019-11-30: qty 1

## 2019-11-30 NOTE — Progress Notes (Signed)
11/21 0120 Pt was crying, agitated and restless. Pt was hallucinating as evidenced by Pt stating that she sees dead nephew.Pt also stated that her nephew is coming to get her. Pt was sitting at the bed side wanting to get. RN educated and encourage patient to lay back in bed. Pt understand and laid back in bed. Pt is calm now.

## 2019-11-30 NOTE — Progress Notes (Signed)
Patient attempted to leave room and hospital security called. Offered patient ativan, patient refused. Page on call, no response.
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
## 2019-11-30 NOTE — Progress Notes (Signed)
Patient called nurse in room and wanted to leave AMA. Notified MD, new order IVA. Notified Child psychotherapist.

## 2019-11-30 NOTE — BH Assessment (Signed)
This patient will need a consult to psychiatry rather than TTS due to medical admit. Care team notified.

## 2019-11-30 NOTE — Progress Notes (Signed)
Paged on call again and MD arrived to floor and saw patient.

## 2019-11-30 NOTE — Social Work (Signed)
CSW received call from floor requesting assistance with IVC paperwork. Pt was attempting to leave AMA and doctor had already verbally ordered IVC. Pt was escalating behaviors and security is place attempting to deescalate Pt.

## 2019-11-30 NOTE — Plan of Care (Signed)

## 2019-11-30 NOTE — Progress Notes (Signed)
Floor coverage  Patient admitted for depression with suicidal and homicidal ideation.  In addition, alcoholism with acute alcohol withdrawal syndrome.  UDS done at the time of admission positive for cocaine and benzodiazepines.  She was seen by psychiatry.  Due to recent suicidal ideations and previous psychiatric history and homicidal ideations to harm her children, she met criteria for inpatient psychiatric admission once medically stable.  Paged by nursing staff that patient was trying to leave the hospital.  Patient seen.  Security officers present on the floor.  Patient very agitated and trying to leave the hospital. Walking in the hallway with her hospital gown open and backwards, naked.  She is not redirectable. Due to her mental illness, she is a threat to self and others. As such, she has been involuntarily committed.   -Haldol ordered for sedation, soft restraints, safety sitter

## 2019-11-30 NOTE — Progress Notes (Signed)
Notified MD via secure chat that social worker is coming with paper work and needs him to sign.

## 2019-11-30 NOTE — Progress Notes (Signed)
PROGRESS NOTE    Rachel Nolan  KPT:[id number] DOB: May 20, 1970 DOA: 11/28/2019 PCP: Claiborne Rigg, NP   Chief Complaint  Patient presents with  . Suicidal  . Homicidal   Brief Narrative: 49 year old female with history of anxiety/depression, asthma, hypertension, scoliosis alcohol use comes to the ED with hallucination, severe tremor, back pain.  In the ED patient reported suicidal and homicidal ideation for 2 days.  As per report She has been drinking alcohol daily, she reported hallucinations and severe tremors. Last drink 48 hours prior to hospitalization.  Positive crack cocaine consumption at home, last used 2 days ago. Anxiety and tremors have been severe in intensity, no improving or worsening factors, associated with poor oral intake and hallucinations.  Persistent over the last 48 hours. Patient was seen AT urgent care 11/9 for chronic bilateral low back pain with bilateral sciatica.  She was prescribed acetaminophen and steroids. In ED she reported suicidal and homicidal ideation for 2 days, apparently one of her family members recently was murderED. She reports not having a establish home. In ED her CIWA score was 24- received benzodiazepines per CIWA protocol, thiamine and 1 L of normal saline and was admitted.  11/30/2019: Patient seen.  No withdrawal symptoms reported.  Awaiting psychiatric input.  Patient is on one-to-one monitoring.  Patient cannot leave the hospital unless cleared for discharge by the psychiatric team.  Subjective: No new complaints. Awaiting psychiatry team input.   Assessment & Plan:s  Alcoholism with acute alcohol withdrawal syndrome: c/o ongoing auditory hallucination suicidal ideation. Continue on benzodiazepine per CIWA protocol, IV hydration, thiamine folate Protonix and as needed Zofran.  Monitor neurochecks and monitoring telemetry. 11/30/2019: No withdrawal symptoms noted.  Depression with suicidal and homicidal ideation continue  one-to-one precaution, psychiatry consult  Substance abuse cocaine abuse continue benzodiazepine per protocol.  Drug screen positive for cocaine and benzodiazepine.  Chronic back pain was recently seen in urgent care center continue hydrocodone Tylenol.  She had recent MRI 08/28/2102 lumbar spine that showed chronic scoliosis with right-sided posterior spinal rod, grade 1 versus mild grade 2 anterolisthesis at L5-S1 with degenerative changes and stenosis. 11/30/2019: Stable.  Nutrition: Diet Order            Diet regular Room service appropriate? Yes; Fluid consistency: Thin  Diet effective now                There is no height or weight on file to calculate BMI.  DVT prophylaxis: enoxaparin (LOVENOX) injection 40 mg Start: 11/28/19 2200 SCDs Start: 11/28/19 1750 Code Status:   Code Status: Full Code  Family Communication: plan of care discussed with patient at bedside.  Status is: Inpatient Remains inpatient appropriate because:IV treatments appropriate due to intensity of illness or inability to take PO and Inpatient level of care appropriate due to severity of illness  Dispo: The patient is from: Home              Anticipated d/c is to: TBD              Anticipated d/c date is: 3 days              Patient currently is not medically stable to d/c.  Consultants:see note  Procedures:see note  Culture/Microbiology No results found for: SDES, SPECREQUEST, CULT, REPTSTATUS  Other culture-see note  Medications: Scheduled Meds: . enoxaparin (LOVENOX) injection  40 mg Subcutaneous Q24H  . folic acid  1 mg Oral Daily  . LORazepam  0-4 mg Intravenous  Q12H   Or  . LORazepam  0-4 mg Oral Q12H  . multivitamin with minerals  1 tablet Oral Daily  . pantoprazole  40 mg Oral Daily  . thiamine  100 mg Oral Daily   Or  . thiamine  100 mg Intravenous Daily   Continuous Infusions:   Antimicrobials: Anti-infectives (From admission, onward)   None     Objective: Vitals: Today's  Vitals   11/30/19 0900 11/30/19 1207 11/30/19 1543 11/30/19 1545  BP: (!) 162/91 (!) 140/97  (!) 156/93  Pulse: 80 67  63  Resp:  17  16  Temp:  97.7 F (36.5 C)  98.3 F (36.8 C)  TempSrc:  Oral  Oral  SpO2:  97%  98%  PainSc:   7      Intake/Output Summary (Last 24 hours) at 11/30/2019 1643 Last data filed at 11/30/2019 1047 Gross per 24 hour  Intake 1044.29 ml  Output --  Net 1044.29 ml   There were no vitals filed for this visit. Weight change:   Intake/Output from previous day: 11/11 0701 - 11/12 0700 In: 2273.4 [I.V.:2273.4] Out: -  Intake/Output this shift: Total I/O In: 240 [P.O.:240] Out: -   Examination: General exam: AAOx3,NAD HEENT: Mild pallor.  No jaundice. Respiratory system: Decreased air entry globally. Cardiovascular system: S1 & S2  Gastrointestinal system: Abdomen is obese, soft and nontender.  Adnexa nonpalpable.  Nervous System: Patient is awake and alert.  Patient moves all extremities.   Extremities: No leg edema.  Data Reviewed: I have personally reviewed following labs and imaging studies CBC: Recent Labs  Lab 11/28/19 1317 11/28/19 2006 11/29/19 0247 11/30/19 0615  WBC 6.7 4.9 5.3 5.9  HGB 13.2 11.6* 11.6* 11.7*  HCT 40.9 36.2 35.2* 34.7*  MCV 97.8 100.3* 97.8 95.9  PLT 281 229 223 211   Basic Metabolic Panel: Recent Labs  Lab 11/28/19 1317 11/28/19 2006 11/29/19 0247 11/30/19 0615  NA 142 142 142 140  K 3.8 3.8 3.8 3.5  CL 107 113* 110 107  CO2 26 20* 24 26  GLUCOSE 126* 126* 98 101*  BUN 19 19 15 12   CREATININE 0.94 0.69 0.96 0.73  CALCIUM 9.1 8.4* 8.8* 9.0  MG  --  2.0  --   --   PHOS  --  3.5  --   --    GFR: CrCl cannot be calculated (Unknown ideal weight.). Liver Function Tests: Recent Labs  Lab 11/28/19 1317 11/28/19 2006  AST 17 14*  ALT 12 9  ALKPHOS 62 50  BILITOT 0.3 0.5  PROT 6.7 5.8*  ALBUMIN 3.6 2.9*   No results for input(s): LIPASE, AMYLASE in the last 168 hours. No results for input(s):  AMMONIA in the last 168 hours. Coagulation Profile: No results for input(s): INR, PROTIME in the last 168 hours. Cardiac Enzymes: No results for input(s): CKTOTAL, CKMB, CKMBINDEX, TROPONINI in the last 168 hours. BNP (last 3 results) No results for input(s): PROBNP in the last 8760 hours. HbA1C: No results for input(s): HGBA1C in the last 72 hours. CBG: No results for input(s): GLUCAP in the last 168 hours. Lipid Profile: No results for input(s): CHOL, HDL, LDLCALC, TRIG, CHOLHDL, LDLDIRECT in the last 72 hours. Thyroid Function Tests: No results for input(s): TSH, T4TOTAL, FREET4, T3FREE, THYROIDAB in the last 72 hours. Anemia Panel: No results for input(s): VITAMINB12, FOLATE, FERRITIN, TIBC, IRON, RETICCTPCT in the last 72 hours. Sepsis Labs: No results for input(s): PROCALCITON, LATICACIDVEN in the last 168 hours.  Recent Results (from the past 240 hour(s))  Respiratory Panel by RT PCR (Flu A&B, Covid) - Nasopharyngeal Swab     Status: None   Collection Time: 11/28/19  3:41 PM   Specimen: Nasopharyngeal Swab  Result Value Ref Range Status   SARS Coronavirus 2 by RT PCR NEGATIVE NEGATIVE Final    Comment: (NOTE) SARS-CoV-2 target nucleic acids are NOT DETECTED.  The SARS-CoV-2 RNA is generally detectable in upper respiratoy specimens during the acute phase of infection. The lowest concentration of SARS-CoV-2 viral copies this assay can detect is 131 copies/mL. A negative result does not preclude SARS-Cov-2 infection and should not be used as the sole basis for treatment or other patient management decisions. A negative result may occur with  improper specimen collection/handling, submission of specimen other than nasopharyngeal swab, presence of viral mutation(s) within the areas targeted by this assay, and inadequate number of viral copies (<131 copies/mL). A negative result must be combined with clinical observations, patient history, and epidemiological information.  The expected result is Negative.  Fact Sheet for Patients:  https://www.moore.com/  Fact Sheet for Healthcare Providers:  https://www.young.biz/  This test is no t yet approved or cleared by the [place] FDA and  has been authorized for detection and/or diagnosis of SARS-CoV-2 by FDA under an Emergency Use Authorization (EUA). This EUA will remain  in effect (meaning this test can be used) for the duration of the COVID-19 declaration under Section 564(b)(1) of the Act, 21 U.S.C. section 360bbb-3(b)(1), unless the authorization is terminated or revoked sooner.     Influenza A by PCR NEGATIVE NEGATIVE Final   Influenza B by PCR NEGATIVE NEGATIVE Final    Comment: (NOTE) The Xpert Xpress SARS-CoV-2/FLU/RSV assay is intended as an aid in  the diagnosis of influenza from Nasopharyngeal swab specimens and  should not be used as a sole basis for treatment. Nasal washings and  aspirates are unacceptable for Xpert Xpress SARS-CoV-2/FLU/RSV  testing.  Fact Sheet for Patients: https://www.moore.com/  Fact Sheet for Healthcare Providers: https://www.young.biz/  This test is not yet approved or cleared by the [place] FDA and  has been authorized for detection and/or diagnosis of SARS-CoV-2 by  FDA under an Emergency Use Authorization (EUA). This EUA will remain  in effect (meaning this test can be used) for the duration of the  Covid-19 declaration under Section 564(b)(1) of the Act, 21  U.S.C. section 360bbb-3(b)(1), unless the authorization is  terminated or revoked. Performed at [facility], [address], [place], [place] [address]      Radiology Studies: No results found.   LOS: 2 days   Barnetta Chapel, MD [facility]  11/30/2019, 4:43 PM

## 2019-12-01 DIAGNOSIS — F322 Major depressive disorder, single episode, severe without psychotic features: Secondary | ICD-10-CM

## 2019-12-01 MED ORDER — FLUOXETINE HCL 20 MG PO CAPS
20.0000 mg | ORAL_CAPSULE | Freq: Every day | ORAL | Status: DC
  Administered 2019-12-02 – 2019-12-05 (×4): 20 mg via ORAL
  Filled 2019-12-01 (×4): qty 1

## 2019-12-01 MED ORDER — GABAPENTIN 300 MG PO CAPS
300.0000 mg | ORAL_CAPSULE | Freq: Three times a day (TID) | ORAL | Status: DC
  Administered 2019-12-01 – 2019-12-05 (×13): 300 mg via ORAL
  Filled 2019-12-01 (×13): qty 1

## 2019-12-01 NOTE — Consult Note (Signed)
[facility] Face-to-Face Psychiatry Consult   Reason for Consult: ''suicidal ideation''. Referring Physician:  Berton Mount, MD Patient Identification: Rachel Nolan MRN:  [id number] Principal Diagnosis: MDD (major depressive disorder) Diagnosis:  Principal Problem:   MDD (major depressive disorder) Active Problems:   Post traumatic stress disorder (PTSD)   Cocaine use disorder (HCC)   Depression   Substance induced mood disorder (HCC)   Cocaine dependence (HCC)   Alcohol withdrawal (HCC)   Suicidal ideation   Total Time spent with patient: 1 hour  Subjective:   Rachel Nolan is a 49 y.o. female patient admitted with homicidal and suicidal ideations.  HPI:  49 year old female with history of Major depressive disorder, Anxiety disorder, polysubstance abuse, asthma, hypertension, scoliosis who was admitted to the hospital due to hallucinations, severe tremor, back pain, suicidal and homicidal ideations with no specific plan. Patient reports worsening depressive symptoms since her father passed almost a year ago. Her depression is characterized by recurrent suicidal thoughts, hopelessness, irritability, apprehensions, agitation and insomnia. She reports non-compliant with her psych medications but has been self medicating by drinking alcohol daily, smoking cocaine and abusing Benzodiazepine. Today, she denies psychosis, delusions but unable to contract for safety.   Past Psychiatric History: as above  Risk to Self:  suicidal ideations Risk to Others:  denies Prior Inpatient Therapy:  [facility] Prior Outpatient Therapy:  [facility] clinic  Past Medical History:  Past Medical History:  Diagnosis Date  . Anxiety   . Asthma   . Depression   . Hypertension   . Scoliosis     Past Surgical History:  Procedure Laterality Date  . BACK SURGERY    . ECTOPIC PREGNANCY SURGERY     Family History:  Family History  Problem Relation Age of Onset  . Hypertension Mother   . Hypertension  Father    Family Psychiatric  History:  Social History:  Social History   Substance and Sexual Activity  Alcohol Use Yes     Social History   Substance and Sexual Activity  Drug Use Yes  . Types: Marijuana, Cocaine, "Crack" cocaine   Comment: Currently using crack    Social History   Socioeconomic History  . Marital status: Divorced    Spouse name: Not on file  . Number of children: Not on file  . Years of education: Not on file  . Highest education level: Not on file  Occupational History  . Not on file  Tobacco Use  . Smoking status: Current Every Day Smoker    Types: Cigarettes  . Smokeless tobacco: Never Used  Substance and Sexual Activity  . Alcohol use: Yes  . Drug use: Yes    Types: Marijuana, Cocaine, "Crack" cocaine    Comment: Currently using crack  . Sexual activity: Not Currently  Other Topics Concern  . Not on file  Social History Narrative   Pt is homeless, no fixed address; not followed by an outpatient psychiatrist   Social Determinants of Health   Financial Resource Strain:   . Difficulty of Paying Living Expenses: Not on file  Food Insecurity:   . Worried About Programme researcher, broadcasting/film/video in the Last Year: Not on file  . Ran Out of Food in the Last Year: Not on file  Transportation Needs:   . Lack of Transportation (Medical): Not on file  . Lack of Transportation (Non-Medical): Not on file  Physical Activity:   . Days of Exercise per Week: Not on file  . Minutes of Exercise per  Session: Not on file  Stress:   . Feeling of Stress : Not on file  Social Connections:   . Frequency of Communication with Friends and Family: Not on file  . Frequency of Social Gatherings with Friends and Family: Not on file  . Attends Religious Services: Not on file  . Active Member of Clubs or Organizations: Not on file  . Attends Banker Meetings: Not on file  . Marital Status: Not on file   Additional Social History:    Allergies:  No Known  Allergies  Labs:  Results for orders placed or performed during the hospital encounter of 11/28/19 (from the past 48 hour(s))  Basic metabolic panel     Status: Abnormal   Collection Time: 11/30/19  6:15 AM  Result Value Ref Range   Sodium 140 135 - 145 mmol/L   Potassium 3.5 3.5 - 5.1 mmol/L   Chloride 107 98 - 111 mmol/L   CO2 26 22 - 32 mmol/L   Glucose, Bld 101 (H) 70 - 99 mg/dL    Comment: Glucose reference range applies only to samples taken after fasting for at least 8 hours.   BUN 12 6 - 20 mg/dL   Creatinine, Ser 3.47 0.44 - 1.00 mg/dL   Calcium 9.0 8.9 - 42.5 mg/dL   GFR, Estimated >95 >63 mL/min    Comment: (NOTE) Calculated using the CKD-EPI Creatinine Equation (2021)    Anion gap 7 5 - 15    Comment: Performed at [facility], [address], [place], [place] [address]  CBC     Status: Abnormal   Collection Time: 11/30/19  6:15 AM  Result Value Ref Range   WBC 5.9 4.0 - 10.5 K/uL   RBC 3.62 (L) 3.87 - 5.11 MIL/uL   Hemoglobin 11.7 (L) 12.0 - 15.0 g/dL   HCT 33.2 (L) 36 - 46 %   MCV 95.9 80.0 - 100.0 fL   MCH 32.3 26.0 - 34.0 pg   MCHC 33.7 30.0 - 36.0 g/dL   RDW 95.1 88.4 - 16.6 %   Platelets 211 150 - 400 K/uL   nRBC 0.0 0.0 - 0.2 %    Comment: Performed at [facility], [address] [place], [place] [address]    Current Facility-Administered Medications  Medication Dose Route Frequency Provider Last Rate Last Admin  . acetaminophen (TYLENOL) tablet 650 mg  650 mg Oral Q6H PRN Arrien, York Ram, MD      . albuterol (VENTOLIN HFA) 108 (90 Base) MCG/ACT inhaler 2 puff  2 puff Inhalation Q6H PRN John Giovanni, MD      . enoxaparin (LOVENOX) injection 40 mg  40 mg Subcutaneous Q24H Coralie Keens, MD   40 mg at 11/29/19 2215  . [START ON 12/02/2019] FLUoxetine (PROZAC) capsule 20 mg  20 mg Oral Daily Winnona Wargo, MD      . folic acid (FOLVITE) tablet 1 mg  1 mg Oral Daily Arrien, York Ram, MD   1 mg at 12/01/19 1013   . gabapentin (NEURONTIN) capsule 300 mg  300 mg Oral TID Thedore Mins, MD      . guaiFENesin (ROBITUSSIN) 100 MG/5ML solution 100 mg  5 mL Oral Q4H PRN Lanae Boast, MD   100 mg at 11/29/19 2146  . LORazepam (ATIVAN) injection 0-4 mg  0-4 mg Intravenous Q12H Venter, Margaux, PA-C       Or  . LORazepam (ATIVAN) tablet 0-4 mg  0-4 mg Oral Q12H  Hyman Hopes, Margaux, PA-C      . multivitamin with minerals tablet 1 tablet  1 tablet Oral Daily Arrien, York Ram, MD   1 tablet at 12/01/19 1013  . ondansetron (ZOFRAN) injection 4 mg  4 mg Intravenous Q6H PRN Arrien, York Ram, MD      . oxyCODONE-acetaminophen (PERCOCET/ROXICET) 5-325 MG per tablet 1 tablet  1 tablet Oral Q4H PRN Arrien, York Ram, MD   1 tablet at 12/01/19 1013  . pantoprazole (PROTONIX) EC tablet 40 mg  40 mg Oral Daily Arrien, York Ram, MD   40 mg at 12/01/19 1013  . thiamine tablet 100 mg  100 mg Oral Daily Hyman Hopes, Margaux, PA-C   100 mg at 12/01/19 1013   Or  . thiamine (B-1) injection 100 mg  100 mg Intravenous Daily Venter, Margaux, PA-C      . tiZANidine (ZANAFLEX) tablet 4 mg  4 mg Oral Q8H PRN Arrien, York Ram, MD   4 mg at 12/01/19 1013    Musculoskeletal: Strength & Muscle Tone: not tested Gait & Station: not tested Patient leans: N/A  Psychiatric Specialty Exam: Physical Exam Psychiatric:        Attention and Perception: She is inattentive.        Mood and Affect: Mood is depressed. Affect is flat.        Speech: Speech normal.        Behavior: Behavior is agitated and aggressive.        Thought Content: Thought content includes suicidal ideation.        Cognition and Memory: Cognition and memory normal.        Judgment: Judgment is impulsive.     Review of Systems  Constitutional: Negative.   HENT: Negative.   Eyes: Negative.   Respiratory: Negative.   Cardiovascular: Negative.   Genitourinary: Negative.   Psychiatric/Behavioral: Positive for agitation, behavioral problems,  dysphoric mood and suicidal ideas.    Blood pressure (!) 159/92, pulse (!) 58, temperature 97.8 F (36.6 C), temperature source Oral, resp. rate 17, last menstrual period 06/19/2018, SpO2 97 %.There is no height or weight on file to calculate BMI.  General Appearance: Casual  Eye Contact:  Minimal  Speech:  Clear and Coherent  Volume:  Decreased  Mood:  Dysphoric  Affect:  Constricted and irritable  Thought Process:  Coherent and Goal Directed  Orientation:  Full (Time, Place, and Person)  Thought Content:  Logical  Suicidal Thoughts:  Yes.  without intent/plan  Homicidal Thoughts:  No  Memory:  Immediate;   Good Recent;   Good Remote;   Fair  Judgement:  Poor  Insight:  Shallow  Psychomotor Activity:  Restlessness  Concentration:  Concentration: Poor and Attention Span: Poor  Recall:  [employer] of Knowledge:  Fair  Language:  Good  Akathisia:  No  Handed:  Right  AIMS (if indicated):     Assets:  Communication Skills Desire for Improvement  ADL's:  Intact  Cognition:  WNL  Sleep:   poor     Treatment Plan Summary: 49 year old female with history of MDD, Anxiety, polysubstance abuse(UTOX positive for cocaine and Benzodiazepine) who was admitted due to back pain, alcohol withdrawal, homicidal and suicidal ideations. Patient reports worsening depression, recurrent suicidal ideations and unable to contract for safety. She meets criteria for inpatient psychiatric admission after she is medically stabilized.  Recommendations: -Continue 1:1 sitter for stabilization -Continue Lorazepam CIWA protocol for Alcohol withdrawal -Add Gabapentin 300 mg TID for alcohol  withdrawal/aggressio/agitation -Add Prozac 20 mg daily for depression. -Consider social worker consult to facilitate inpatient psychiatric admission.  Disposition: Recommend psychiatric Inpatient admission when medically cleared. Supportive therapy provided about ongoing stressors. Psychiatric service signing off.  Re-consult as needed  Thedore MinsMojeed Britnay Magnussen, MD 12/01/2019 11:34 AM

## 2019-12-01 NOTE — Progress Notes (Signed)
PROGRESS NOTE    Rachel Nolan  PYK:[id number] DOB: 04-20-1970 DOA: 11/28/2019 PCP: Claiborne Rigg, NP   Chief Complaint  Patient presents with  . Suicidal  . Homicidal   Brief Narrative: 49 year old female with history of anxiety/depression, asthma, hypertension, scoliosis alcohol use comes to the ED with hallucination, severe tremor, back pain.  In the ED patient reported suicidal and homicidal ideation for 2 days.  As per report She has been drinking alcohol daily, she reported hallucinations and severe tremors. Last drink 48 hours prior to hospitalization.  Positive crack cocaine consumption at home, last used 2 days ago. Anxiety and tremors have been severe in intensity, no improving or worsening factors, associated with poor oral intake and hallucinations.  Persistent over the last 48 hours. Patient was seen AT urgent care 11/9 for chronic bilateral low back pain with bilateral sciatica.  She was prescribed acetaminophen and steroids. In ED she reported suicidal and homicidal ideation for 2 days, apparently one of her family members recently was murderED. She reports not having a establish home. In ED her CIWA score was 24- received benzodiazepines per CIWA protocol, thiamine and 1 L of normal saline and was admitted.  11/30/2019: Patient seen.  No withdrawal symptoms reported.  Awaiting psychiatric input.  Patient is on one-to-one monitoring.  Patient cannot leave the hospital unless cleared for discharge by the psychiatric team.  12/01/2019: Psychiatric input is appreciated. The plan is to discharge patient to a psychiatric facility for an inpatient psychiatric evaluation and management. Patient has been involuntarily committed as patient was trying to leave the hospital. Patient is medically stable for transfer to the psychiatric ward.  Subjective: No new complaints.  Assessment & Plan:s Alcoholism with acute alcohol withdrawal syndrome: c/o ongoing auditory hallucination  suicidal ideation. Continue on benzodiazepine per CIWA protocol, IV hydration, thiamine folate Protonix and as needed Zofran.  Monitor neurochecks and monitoring telemetry. 12/01/2019: No withdrawal symptoms noted. Patient is medically stable for transfer to the psychiatric ward.  Depression with suicidal and homicidal ideation continue one-to-one precaution, psychiatry team input is highly appreciated.  Substance abuse cocaine abuse continue benzodiazepine per protocol.  Drug screen positive for cocaine and benzodiazepine.  Chronic back pain was recently seen in urgent care center continue hydrocodone Tylenol.  She had recent MRI 08/28/2102 lumbar spine that showed chronic scoliosis with right-sided posterior spinal rod, grade 1 versus mild grade 2 anterolisthesis at L5-S1 with degenerative changes and stenosis. 11/30/2019: Stable.  Nutrition: Diet Order            Diet regular Room service appropriate? Yes; Fluid consistency: Thin  Diet effective now                There is no height or weight on file to calculate BMI.  DVT prophylaxis: enoxaparin (LOVENOX) injection 40 mg Start: 11/28/19 2200 SCDs Start: 11/28/19 1750 Code Status:   Code Status: Full Code  Family Communication: plan of care discussed with patient at bedside.  Status is: Inpatient Remains inpatient appropriate because:IV treatments appropriate due to intensity of illness or inability to take PO and Inpatient level of care appropriate due to severity of illness  Dispo: The patient is from: Home              Anticipated d/c is to: TBD              Anticipated d/c date is: 3 days              Patient currently is  not medically stable to d/c.  Consultants:see note  Procedures:see note  Culture/Microbiology No results found for: SDES, SPECREQUEST, CULT, REPTSTATUS  Other culture-see note  Medications: Scheduled Meds: . enoxaparin (LOVENOX) injection  40 mg Subcutaneous Q24H  . [START ON 12/02/2019] FLUoxetine   20 mg Oral Daily  . folic acid  1 mg Oral Daily  . gabapentin  300 mg Oral TID  . LORazepam  0-4 mg Intravenous Q12H   Or  . LORazepam  0-4 mg Oral Q12H  . multivitamin with minerals  1 tablet Oral Daily  . pantoprazole  40 mg Oral Daily  . thiamine  100 mg Oral Daily   Or  . thiamine  100 mg Intravenous Daily   Continuous Infusions:   Antimicrobials: Anti-infectives (From admission, onward)   None     Objective: Vitals: Today's Vitals   11/30/19 2300 12/01/19 0500 12/01/19 1113 12/01/19 1406  BP:  (!) 159/92  (!) 147/95  Pulse:  (!) 58  65  Resp:  17  18  Temp:  97.8 F (36.6 C)  98.1 F (36.7 C)  TempSrc:  Oral  Oral  SpO2:  97%  96%  PainSc: Asleep  Asleep     Intake/Output Summary (Last 24 hours) at 12/01/2019 1545 Last data filed at 12/01/2019 1432 Gross per 24 hour  Intake 480 ml  Output --  Net 480 ml   There were no vitals filed for this visit. Weight change:   Intake/Output from previous day: 11/12 0701 - 11/13 0700 In: 240 [P.O.:240] Out: -  Intake/Output this shift: Total I/O In: 480 [P.O.:480] Out: -   Examination: General exam: AAOx3,NAD HEENT: Mild pallor.  No jaundice. Respiratory system: Decreased air entry globally. Cardiovascular system: S1 & S2  Gastrointestinal system: Abdomen is obese, soft and nontender.  Adnexa nonpalpable.  Nervous System: Patient is awake and alert.  Patient moves all extremities.   Extremities: No leg edema.  Data Reviewed: I have personally reviewed following labs and imaging studies CBC: Recent Labs  Lab 11/28/19 1317 11/28/19 2006 11/29/19 0247 11/30/19 0615  WBC 6.7 4.9 5.3 5.9  HGB 13.2 11.6* 11.6* 11.7*  HCT 40.9 36.2 35.2* 34.7*  MCV 97.8 100.3* 97.8 95.9  PLT 281 229 223 211   Basic Metabolic Panel: Recent Labs  Lab 11/28/19 1317 11/28/19 2006 11/29/19 0247 11/30/19 0615  NA 142 142 142 140  K 3.8 3.8 3.8 3.5  CL 107 113* 110 107  CO2 26 20* 24 26  GLUCOSE 126* 126* 98 101*  BUN  19 19 15 12   CREATININE 0.94 0.69 0.96 0.73  CALCIUM 9.1 8.4* 8.8* 9.0  MG  --  2.0  --   --   PHOS  --  3.5  --   --    GFR: CrCl cannot be calculated (Unknown ideal weight.). Liver Function Tests: Recent Labs  Lab 11/28/19 1317 11/28/19 2006  AST 17 14*  ALT 12 9  ALKPHOS 62 50  BILITOT 0.3 0.5  PROT 6.7 5.8*  ALBUMIN 3.6 2.9*   No results for input(s): LIPASE, AMYLASE in the last 168 hours. No results for input(s): AMMONIA in the last 168 hours. Coagulation Profile: No results for input(s): INR, PROTIME in the last 168 hours. Cardiac Enzymes: No results for input(s): CKTOTAL, CKMB, CKMBINDEX, TROPONINI in the last 168 hours. BNP (last 3 results) No results for input(s): PROBNP in the last 8760 hours. HbA1C: No results for input(s): HGBA1C in the last 72 hours. CBG: No results  for input(s): GLUCAP in the last 168 hours. Lipid Profile: No results for input(s): CHOL, HDL, LDLCALC, TRIG, CHOLHDL, LDLDIRECT in the last 72 hours. Thyroid Function Tests: No results for input(s): TSH, T4TOTAL, FREET4, T3FREE, THYROIDAB in the last 72 hours. Anemia Panel: No results for input(s): VITAMINB12, FOLATE, FERRITIN, TIBC, IRON, RETICCTPCT in the last 72 hours. Sepsis Labs: No results for input(s): PROCALCITON, LATICACIDVEN in the last 168 hours.  Recent Results (from the past 240 hour(s))  Respiratory Panel by RT PCR (Flu A&B, Covid) - Nasopharyngeal Swab     Status: None   Collection Time: 11/28/19  3:41 PM   Specimen: Nasopharyngeal Swab  Result Value Ref Range Status   SARS Coronavirus 2 by RT PCR NEGATIVE NEGATIVE Final    Comment: (NOTE) SARS-CoV-2 target nucleic acids are NOT DETECTED.  The SARS-CoV-2 RNA is generally detectable in upper respiratoy specimens during the acute phase of infection. The lowest concentration of SARS-CoV-2 viral copies this assay can detect is 131 copies/mL. A negative result does not preclude SARS-Cov-2 infection and should not be used as the  sole basis for treatment or other patient management decisions. A negative result may occur with  improper specimen collection/handling, submission of specimen other than nasopharyngeal swab, presence of viral mutation(s) within the areas targeted by this assay, and inadequate number of viral copies (<131 copies/mL). A negative result must be combined with clinical observations, patient history, and epidemiological information. The expected result is Negative.  Fact Sheet for Patients:  https://www.moore.com/  Fact Sheet for Healthcare Providers:  https://www.young.biz/  This test is no t yet approved or cleared by the [place] FDA and  has been authorized for detection and/or diagnosis of SARS-CoV-2 by FDA under an Emergency Use Authorization (EUA). This EUA will remain  in effect (meaning this test can be used) for the duration of the COVID-19 declaration under Section 564(b)(1) of the Act, 21 U.S.C. section 360bbb-3(b)(1), unless the authorization is terminated or revoked sooner.     Influenza A by PCR NEGATIVE NEGATIVE Final   Influenza B by PCR NEGATIVE NEGATIVE Final    Comment: (NOTE) The Xpert Xpress SARS-CoV-2/FLU/RSV assay is intended as an aid in  the diagnosis of influenza from Nasopharyngeal swab specimens and  should not be used as a sole basis for treatment. Nasal washings and  aspirates are unacceptable for Xpert Xpress SARS-CoV-2/FLU/RSV  testing.  Fact Sheet for Patients: https://www.moore.com/  Fact Sheet for Healthcare Providers: https://www.young.biz/  This test is not yet approved or cleared by the [place] FDA and  has been authorized for detection and/or diagnosis of SARS-CoV-2 by  FDA under an Emergency Use Authorization (EUA). This EUA will remain  in effect (meaning this test can be used) for the duration of the  Covid-19 declaration under Section 564(b)(1) of  the Act, 21  U.S.C. section 360bbb-3(b)(1), unless the authorization is  terminated or revoked. Performed at [facility], [address], [place], [place] [address]      Radiology Studies: No results found.   LOS: 3 days   Barnetta Chapel, MD [facility]  12/01/2019, 3:45 PM

## 2019-12-01 NOTE — Plan of Care (Signed)
  Problem: Education: Goal: Knowledge of disease or condition will improve Outcome: Progressing Goal: Understanding of discharge needs will improve Outcome: Progressing   Problem: Health Behavior/Discharge Planning: Goal: Ability to identify changes in lifestyle to reduce recurrence of condition will improve Outcome: Progressing Goal: Identification of resources available to assist in meeting health care needs will improve Outcome: Progressing   Problem: Physical Regulation: Goal: Complications related to the disease process, condition or treatment will be avoided or minimized Outcome: Progressing   

## 2019-12-02 ENCOUNTER — Encounter (HOSPITAL_COMMUNITY): Payer: Self-pay | Admitting: Internal Medicine

## 2019-12-02 NOTE — Progress Notes (Signed)
Pt seen in room with attending MD at [facility]. Per MD ok to use her game phone/cellphone, Pt alert/oriented  In no apparent distress, Cooperative and denies any pain/discomfort at the same time. No complaints voiced. MD aware that  Bilateral restraints has been D/c'd.Pt remains calm/cooperative.

## 2019-12-02 NOTE — Progress Notes (Signed)
PROGRESS NOTE    Rachel Nolan  ACZ:[id number] DOB: 01-21-1970 DOA: 11/28/2019 PCP: Claiborne Rigg, NP    Brief Narrative:  This 49 year old female with history of anxiety/depression, asthma, hypertension, scoliosis,  alcohol use comes to the ED with hallucinations, severe tremor, back pain.  In the ED patient reported suicidal and homicidal ideation for 2 days. Positive crack cocaine consumption at home, last used 2 days ago. The plan is to discharge patient to a psychiatric facility for an inpatient psychiatric evaluation and management. Patient has been involuntarily committed as patient was trying to leave the hospital. Patient is medically stable for transfer to the psychiatric ward.  Assessment & Plan:   Principal Problem:   MDD (major depressive disorder) Active Problems:   Post traumatic stress disorder (PTSD)   Cocaine use disorder (HCC)   Depression   Substance induced mood disorder (HCC)   Cocaine dependence (HCC)   Alcohol withdrawal (HCC)   Suicidal ideation  Alcoholism with acute alcohol withdrawal syndrome:  Patient c/o ongoing auditory hallucinations,  suicidal ideation.  Continue on benzodiazepine per CIWA protocol, IV hydration, thiamine folate  Continue Protonix and as needed Zofran.  Monitor neurochecks and monitoring telemetry. No withdrawal symptoms noted. Patient is medically stable for transfer to the psychiatric ward.  Depression with suicidal and homicidal ideation: Continue one-to-one precaution, psychiatry team input is highly appreciated.  Substance abuse cocaine abuse: Continue benzodiazepine per protocol.  Drug screen positive for cocaine and benzodiazepine.  Chronic back pain: She was recently seen in urgent care center,  continue hydrocodone Tylenol.   She had recent MRI 08/28/2102 lumbar spine that showed chronic scoliosis with right-sided posterior spinal rod, grade 1 versus mild grade 2 anterolisthesis at L5-S1 with degenerative  changes and stenosis.    DVT prophylaxis:  Lovenox Code Status: Full Family Communication:  No family at bed side. Disposition Plan:   Status is: Inpatient  Remains inpatient appropriate because:Inpatient level of care appropriate due to severity of illness   Dispo: The patient is from: Home              Anticipated d/c is to: Inpatient psychiatry              Anticipated d/c date is: 2 days              Patient currently is medically stable to d/c.   Consultants:    Psychiatry  Procedures:  NOne Antimicrobials: Anti-infectives (From admission, onward)   None      Subjective: Patient was seen and examined at bedside she appears better.  She denies any suicidal,  homicidal ideations but reports hallucinations.  She is awaiting transfer to inpatient psych unit.  Objective: Vitals:   12/01/19 1406 12/02/19 0628 12/02/19 1300 12/02/19 1339  BP: (!) 147/95 (!) 150/93 (!) 141/88 (!) 141/88  Pulse: 65 73 78 78  Resp: 18 16 18 18   Temp: 98.1 F (36.7 C) 98.5 F (36.9 C) 98 F (36.7 C) 98.3 F (36.8 C)  TempSrc: Oral Oral Oral Oral  SpO2: 96% 96% 98% 97%  Weight:    85 kg  Height:    5\' 6"  (1.676 m)    Intake/Output Summary (Last 24 hours) at 12/02/2019 1608 Last data filed at 12/02/2019 1300 Gross per 24 hour  Intake 1320 ml  Output --  Net 1320 ml   Filed Weights   12/02/19 1339  Weight: 85 kg    Examination:  General exam: Appears calm and comfortable  Respiratory system: Clear  to auscultation. Respiratory effort normal. Cardiovascular system: S1 & S2 heard, RRR. No JVD, murmurs, rubs, gallops or clicks. No pedal edema. Gastrointestinal system: Abdomen is nondistended, soft and nontender. No organomegaly or masses felt.  Normal bowel sounds heard. Central nervous system: Alert and oriented. No focal neurological deficits. Extremities: No edema, no cyanosis, no clubbing. Skin: No rashes, lesions or ulcers Psychiatry: Judgement and insight appear  normal. Mood & affect appropriate.  Hallucinations, denies suicidal homicidal ideations.    Data Reviewed: I have personally reviewed following labs and imaging studies  CBC: Recent Labs  Lab 11/28/19 1317 11/28/19 2006 11/29/19 0247 11/30/19 0615  WBC 6.7 4.9 5.3 5.9  HGB 13.2 11.6* 11.6* 11.7*  HCT 40.9 36.2 35.2* 34.7*  MCV 97.8 100.3* 97.8 95.9  PLT 281 229 223 211   Basic Metabolic Panel: Recent Labs  Lab 11/28/19 1317 11/28/19 2006 11/29/19 0247 11/30/19 0615  NA 142 142 142 140  K 3.8 3.8 3.8 3.5  CL 107 113* 110 107  CO2 26 20* 24 26  GLUCOSE 126* 126* 98 101*  BUN 19 19 15 12   CREATININE 0.94 0.69 0.96 0.73  CALCIUM 9.1 8.4* 8.8* 9.0  MG  --  2.0  --   --   PHOS  --  3.5  --   --    GFR: Estimated Creatinine Clearance: 91.4 mL/min (by C-G formula based on SCr of 0.73 mg/dL). Liver Function Tests: Recent Labs  Lab 11/28/19 1317 11/28/19 2006  AST 17 14*  ALT 12 9  ALKPHOS 62 50  BILITOT 0.3 0.5  PROT 6.7 5.8*  ALBUMIN 3.6 2.9*   No results for input(s): LIPASE, AMYLASE in the last 168 hours. No results for input(s): AMMONIA in the last 168 hours. Coagulation Profile: No results for input(s): INR, PROTIME in the last 168 hours. Cardiac Enzymes: No results for input(s): CKTOTAL, CKMB, CKMBINDEX, TROPONINI in the last 168 hours. BNP (last 3 results) No results for input(s): PROBNP in the last 8760 hours. HbA1C: No results for input(s): HGBA1C in the last 72 hours. CBG: No results for input(s): GLUCAP in the last 168 hours. Lipid Profile: No results for input(s): CHOL, HDL, LDLCALC, TRIG, CHOLHDL, LDLDIRECT in the last 72 hours. Thyroid Function Tests: No results for input(s): TSH, T4TOTAL, FREET4, T3FREE, THYROIDAB in the last 72 hours. Anemia Panel: No results for input(s): VITAMINB12, FOLATE, FERRITIN, TIBC, IRON, RETICCTPCT in the last 72 hours. Sepsis Labs: No results for input(s): PROCALCITON, LATICACIDVEN in the last 168  hours.  Recent Results (from the past 240 hour(s))  Respiratory Panel by RT PCR (Flu A&B, Covid) - Nasopharyngeal Swab     Status: None   Collection Time: 11/28/19  3:41 PM   Specimen: Nasopharyngeal Swab  Result Value Ref Range Status   SARS Coronavirus 2 by RT PCR NEGATIVE NEGATIVE Final    Comment: (NOTE) SARS-CoV-2 target nucleic acids are NOT DETECTED.  The SARS-CoV-2 RNA is generally detectable in upper respiratoy specimens during the acute phase of infection. The lowest concentration of SARS-CoV-2 viral copies this assay can detect is 131 copies/mL. A negative result does not preclude SARS-Cov-2 infection and should not be used as the sole basis for treatment or other patient management decisions. A negative result may occur with  improper specimen collection/handling, submission of specimen other than nasopharyngeal swab, presence of viral mutation(s) within the areas targeted by this assay, and inadequate number of viral copies (<131 copies/mL). A negative result must be combined with clinical observations, patient  history, and epidemiological information. The expected result is Negative.  Fact Sheet for Patients:  https://www.moore.com/  Fact Sheet for Healthcare Providers:  https://www.young.biz/  This test is no t yet approved or cleared by the [place] FDA and  has been authorized for detection and/or diagnosis of SARS-CoV-2 by FDA under an Emergency Use Authorization (EUA). This EUA will remain  in effect (meaning this test can be used) for the duration of the COVID-19 declaration under Section 564(b)(1) of the Act, 21 U.S.C. section 360bbb-3(b)(1), unless the authorization is terminated or revoked sooner.     Influenza A by PCR NEGATIVE NEGATIVE Final   Influenza B by PCR NEGATIVE NEGATIVE Final    Comment: (NOTE) The Xpert Xpress SARS-CoV-2/FLU/RSV assay is intended as an aid in  the diagnosis of influenza from  Nasopharyngeal swab specimens and  should not be used as a sole basis for treatment. Nasal washings and  aspirates are unacceptable for Xpert Xpress SARS-CoV-2/FLU/RSV  testing.  Fact Sheet for Patients: https://www.moore.com/  Fact Sheet for Healthcare Providers: https://www.young.biz/  This test is not yet approved or cleared by the [place] FDA and  has been authorized for detection and/or diagnosis of SARS-CoV-2 by  FDA under an Emergency Use Authorization (EUA). This EUA will remain  in effect (meaning this test can be used) for the duration of the  Covid-19 declaration under Section 564(b)(1) of the Act, 21  U.S.C. section 360bbb-3(b)(1), unless the authorization is  terminated or revoked. Performed at [facility], [address], [place], [place] [address]     Radiology Studies: No results found.  Scheduled Meds: . enoxaparin (LOVENOX) injection  40 mg Subcutaneous Q24H  . FLUoxetine  20 mg Oral Daily  . folic acid  1 mg Oral Daily  . gabapentin  300 mg Oral TID  . LORazepam  0-4 mg Intravenous Q12H   Or  . LORazepam  0-4 mg Oral Q12H  . multivitamin with minerals  1 tablet Oral Daily  . pantoprazole  40 mg Oral Daily  . thiamine  100 mg Oral Daily   Or  . thiamine  100 mg Intravenous Daily   Continuous Infusions:   LOS: 4 days    Time spent: 25 mins    Cipriano Bunker, MD [facility]   If 7PM-7AM, please contact night-coverage

## 2019-12-02 NOTE — Progress Notes (Signed)
Calmer and more cooperative this shift-

## 2019-12-03 NOTE — Progress Notes (Signed)
PROGRESS NOTE    Rachel Nolan  MIW:[id number] DOB: Feb 09, 1970 DOA: 11/28/2019 PCP: Claiborne Rigg, NP    Brief Narrative:  This 49 year old female with history of anxiety/depression, asthma, hypertension, scoliosis,  alcohol use comes to the ED with hallucinations, severe tremor, back pain.  In the ED patient reported suicidal and homicidal ideation for 2 days. Positive crack cocaine consumption at home, last used 2 days ago. The plan is to discharge patient to a psychiatric facility for an inpatient psychiatric evaluation and management. Patient has been involuntarily committed as patient was trying to leave the hospital. Patient is medically stable for transfer to the psychiatric ward.  Assessment & Plan:   Principal Problem:   MDD (major depressive disorder) Active Problems:   Post traumatic stress disorder (PTSD)   Cocaine use disorder (HCC)   Depression   Substance induced mood disorder (HCC)   Cocaine dependence (HCC)   Alcohol withdrawal (HCC)   Suicidal ideation  Alcoholism with acute alcohol withdrawal syndrome:  Patient c/o ongoing auditory hallucinations,  suicidal ideation.  Continue on benzodiazepine per CIWA protocol, IV hydration, thiamine folate  Continue Protonix and as needed Zofran.  Monitor neurochecks and monitoring telemetry. No withdrawal symptoms noted. Patient is medically stable for transfer to the psychiatric ward.  Depression with suicidal and homicidal ideation: Continue one-to-one precaution, psychiatry team input is highly appreciated.  Substance abuse cocaine abuse: Continue benzodiazepine per protocol.  Drug screen positive for cocaine and benzodiazepine.  Chronic back pain: She was recently seen in urgent care center,  continue hydrocodone Tylenol.   She had recent MRI 08/28/2102 lumbar spine that showed chronic scoliosis with right-sided posterior spinal rod, grade 1 versus mild grade 2 anterolisthesis at L5-S1 with degenerative  changes and stenosis.    DVT prophylaxis:  Lovenox Code Status: Full Family Communication:  No family at bed side. Disposition Plan:   Status is: Inpatient  Remains inpatient appropriate because:Inpatient level of care appropriate due to severity of illness   Dispo: The patient is from: Home              Anticipated d/c is to: Inpatient psychiatry              Anticipated d/c date is: 2 days              Patient currently is medically stable to d/c.   Consultants:    Psychiatry  Procedures:  NOne Antimicrobials: Anti-infectives (From admission, onward)   None      Subjective: Patient was seen and examined at bedside. she reports feeling better.  She denies any suicidal,  homicidal ideations but reports hallucinations.   She is awaiting transfer to inpatient psych unit.  Objective: Vitals:   12/02/19 1300 12/02/19 1339 12/02/19 1910 12/03/19 0701  BP: (!) 141/88 (!) 141/88 (!) 170/105 (!) 170/87  Pulse: 78 78 86 (!) 56  Resp: 18 18 18 16   Temp: 98 F (36.7 C) 98.3 F (36.8 C) 98.6 F (37 C) 98.3 F (36.8 C)  TempSrc: Oral Oral Oral Oral  SpO2: 98% 97% 99% 97%  Weight:  85 kg    Height:  5\' 6"  (1.676 m)      Intake/Output Summary (Last 24 hours) at 12/03/2019 1616 Last data filed at 12/03/2019 1355 Gross per 24 hour  Intake 1080 ml  Output --  Net 1080 ml   Filed Weights   12/02/19 1339  Weight: 85 kg    Examination:  General exam: Appears calm and comfortable  Respiratory system: Clear to auscultation. Respiratory effort normal. Cardiovascular system: S1 & S2 heard, RRR. No JVD, murmurs, rubs, gallops or clicks. No pedal edema. Gastrointestinal system: Abdomen is nondistended, soft and nontender. No organomegaly or masses felt.  Normal bowel sounds heard. Central nervous system: Alert and oriented. No focal neurological deficits. Extremities: No edema, no cyanosis, no clubbing. Skin: No rashes, lesions or ulcers Psychiatry: Judgement and  insight appear normal. Mood & affect appropriate.  Hallucinations, denies suicidal homicidal ideations.    Data Reviewed: I have personally reviewed following labs and imaging studies  CBC: Recent Labs  Lab 11/28/19 1317 11/28/19 2006 11/29/19 0247 11/30/19 0615  WBC 6.7 4.9 5.3 5.9  HGB 13.2 11.6* 11.6* 11.7*  HCT 40.9 36.2 35.2* 34.7*  MCV 97.8 100.3* 97.8 95.9  PLT 281 229 223 211   Basic Metabolic Panel: Recent Labs  Lab 11/28/19 1317 11/28/19 2006 11/29/19 0247 11/30/19 0615  NA 142 142 142 140  K 3.8 3.8 3.8 3.5  CL 107 113* 110 107  CO2 26 20* 24 26  GLUCOSE 126* 126* 98 101*  BUN 19 19 15 12   CREATININE 0.94 0.69 0.96 0.73  CALCIUM 9.1 8.4* 8.8* 9.0  MG  --  2.0  --   --   PHOS  --  3.5  --   --    GFR: Estimated Creatinine Clearance: 91.4 mL/min (by C-G formula based on SCr of 0.73 mg/dL). Liver Function Tests: Recent Labs  Lab 11/28/19 1317 11/28/19 2006  AST 17 14*  ALT 12 9  ALKPHOS 62 50  BILITOT 0.3 0.5  PROT 6.7 5.8*  ALBUMIN 3.6 2.9*   No results for input(s): LIPASE, AMYLASE in the last 168 hours. No results for input(s): AMMONIA in the last 168 hours. Coagulation Profile: No results for input(s): INR, PROTIME in the last 168 hours. Cardiac Enzymes: No results for input(s): CKTOTAL, CKMB, CKMBINDEX, TROPONINI in the last 168 hours. BNP (last 3 results) No results for input(s): PROBNP in the last 8760 hours. HbA1C: No results for input(s): HGBA1C in the last 72 hours. CBG: No results for input(s): GLUCAP in the last 168 hours. Lipid Profile: No results for input(s): CHOL, HDL, LDLCALC, TRIG, CHOLHDL, LDLDIRECT in the last 72 hours. Thyroid Function Tests: No results for input(s): TSH, T4TOTAL, FREET4, T3FREE, THYROIDAB in the last 72 hours. Anemia Panel: No results for input(s): VITAMINB12, FOLATE, FERRITIN, TIBC, IRON, RETICCTPCT in the last 72 hours. Sepsis Labs: No results for input(s): PROCALCITON, LATICACIDVEN in the last 168  hours.  Recent Results (from the past 240 hour(s))  Respiratory Panel by RT PCR (Flu A&B, Covid) - Nasopharyngeal Swab     Status: None   Collection Time: 11/28/19  3:41 PM   Specimen: Nasopharyngeal Swab  Result Value Ref Range Status   SARS Coronavirus 2 by RT PCR NEGATIVE NEGATIVE Final    Comment: (NOTE) SARS-CoV-2 target nucleic acids are NOT DETECTED.  The SARS-CoV-2 RNA is generally detectable in upper respiratoy specimens during the acute phase of infection. The lowest concentration of SARS-CoV-2 viral copies this assay can detect is 131 copies/mL. A negative result does not preclude SARS-Cov-2 infection and should not be used as the sole basis for treatment or other patient management decisions. A negative result may occur with  improper specimen collection/handling, submission of specimen other than nasopharyngeal swab, presence of viral mutation(s) within the areas targeted by this assay, and inadequate number of viral copies (<131 copies/mL). A negative result must be combined with  clinical observations, patient history, and epidemiological information. The expected result is Negative.  Fact Sheet for Patients:  https://www.moore.com/  Fact Sheet for Healthcare Providers:  https://www.young.biz/  This test is no t yet approved or cleared by the [place] FDA and  has been authorized for detection and/or diagnosis of SARS-CoV-2 by FDA under an Emergency Use Authorization (EUA). This EUA will remain  in effect (meaning this test can be used) for the duration of the COVID-19 declaration under Section 564(b)(1) of the Act, 21 U.S.C. section 360bbb-3(b)(1), unless the authorization is terminated or revoked sooner.     Influenza A by PCR NEGATIVE NEGATIVE Final   Influenza B by PCR NEGATIVE NEGATIVE Final    Comment: (NOTE) The Xpert Xpress SARS-CoV-2/FLU/RSV assay is intended as an aid in  the diagnosis of influenza from  Nasopharyngeal swab specimens and  should not be used as a sole basis for treatment. Nasal washings and  aspirates are unacceptable for Xpert Xpress SARS-CoV-2/FLU/RSV  testing.  Fact Sheet for Patients: https://www.moore.com/  Fact Sheet for Healthcare Providers: https://www.young.biz/  This test is not yet approved or cleared by the [place] FDA and  has been authorized for detection and/or diagnosis of SARS-CoV-2 by  FDA under an Emergency Use Authorization (EUA). This EUA will remain  in effect (meaning this test can be used) for the duration of the  Covid-19 declaration under Section 564(b)(1) of the Act, 21  U.S.C. section 360bbb-3(b)(1), unless the authorization is  terminated or revoked. Performed at [facility], [address], [place], [place] [address]     Radiology Studies: No results found.  Scheduled Meds: . enoxaparin (LOVENOX) injection  40 mg Subcutaneous Q24H  . FLUoxetine  20 mg Oral Daily  . folic acid  1 mg Oral Daily  . gabapentin  300 mg Oral TID  . multivitamin with minerals  1 tablet Oral Daily  . pantoprazole  40 mg Oral Daily  . thiamine  100 mg Oral Daily   Or  . thiamine  100 mg Intravenous Daily   Continuous Infusions:   LOS: 5 days    Time spent: 25 mins    Cipriano Bunker, MD [facility]   If 7PM-7AM, please contact night-coverage

## 2019-12-03 NOTE — Progress Notes (Signed)
1:1 sitter is at the bedside.  No distress noted.  The patient is calm and cooperative

## 2019-12-03 NOTE — Progress Notes (Signed)
Safety sitter remains at the bedside.  1:1 continuous observation.  The patient is calm and cooperative

## 2019-12-03 NOTE — Progress Notes (Signed)
The patient appears to be sleeping.  Continuous sitter is at the bedside for safety.

## 2019-12-03 NOTE — Progress Notes (Signed)
1:1 continuous sitter for safety.  The patient has been calm and cooperative

## 2019-12-03 NOTE — Plan of Care (Signed)
  Problem: Education: Goal: Knowledge of disease or condition will improve Outcome: Progressing Goal: Understanding of discharge needs will improve Outcome: Progressing   Problem: Health Behavior/Discharge Planning: Goal: Ability to identify changes in lifestyle to reduce recurrence of condition will improve Outcome: Progressing Goal: Identification of resources available to assist in meeting health care needs will improve Outcome: Progressing   Problem: Physical Regulation: Goal: Complications related to the disease process, condition or treatment will be avoided or minimized Outcome: Progressing   Problem: Safety: Goal: Ability to remain free from injury will improve Outcome: Progressing   Problem: Education: Goal: Knowledge of General Education information will improve Description: Including pain rating scale, medication(s)/side effects and non-pharmacologic comfort measures Outcome: Progressing   Problem: Health Behavior/Discharge Planning: Goal: Ability to manage health-related needs will improve Outcome: Progressing   Problem: Clinical Measurements: Goal: Ability to maintain clinical measurements within normal limits will improve Outcome: Progressing Goal: Will remain free from infection Outcome: Progressing Goal: Diagnostic test results will improve Outcome: Progressing Goal: Respiratory complications will improve Outcome: Progressing Goal: Cardiovascular complication will be avoided Outcome: Progressing   Problem: Activity: Goal: Risk for activity intolerance will decrease Outcome: Progressing   Problem: Nutrition: Goal: Adequate nutrition will be maintained Outcome: Progressing   Problem: Coping: Goal: Level of anxiety will decrease Outcome: Progressing   Problem: Elimination: Goal: Will not experience complications related to bowel motility Outcome: Progressing Goal: Will not experience complications related to urinary retention Outcome:  Progressing   Problem: Pain Managment: Goal: General experience of comfort will improve Outcome: Progressing   Problem: Safety: Goal: Ability to remain free from injury will improve Outcome: Progressing   Problem: Skin Integrity: Goal: Risk for impaired skin integrity will decrease Outcome: Progressing   

## 2019-12-04 LAB — RESPIRATORY PANEL BY RT PCR (FLU A&B, COVID)
Influenza A by PCR: NEGATIVE
Influenza B by PCR: NEGATIVE
SARS Coronavirus 2 by RT PCR: NEGATIVE

## 2019-12-04 MED ORDER — MELATONIN 3 MG PO TABS
3.0000 mg | ORAL_TABLET | Freq: Every day | ORAL | Status: DC
  Administered 2019-12-04: 3 mg via ORAL
  Filled 2019-12-04: qty 1

## 2019-12-04 MED ORDER — SENNA 8.6 MG PO TABS: 1.0000 | ORAL_TABLET | Freq: Every day | ORAL | Status: DC | PRN

## 2019-12-04 MED FILL — AMLODIPINE BESYLATE 5 MG TA: 5 | 30 days supply | Qty: 30 | Fill #0

## 2019-12-04 MED FILL — tiZANidine HCL 4 MG TABS: 4 | 10 days supply | Qty: 30 | Fill #0

## 2019-12-04 NOTE — Plan of Care (Signed)
  Problem: Education: Goal: Knowledge of disease or condition will improve Outcome: Progressing Goal: Understanding of discharge needs will improve Outcome: Progressing   Problem: Health Behavior/Discharge Planning: Goal: Ability to identify changes in lifestyle to reduce recurrence of condition will improve Outcome: Progressing Goal: Identification of resources available to assist in meeting health care needs will improve Outcome: Progressing   Problem: Physical Regulation: Goal: Complications related to the disease process, condition or treatment will be avoided or minimized Outcome: Progressing   Problem: Safety: Goal: Ability to remain free from injury will improve Outcome: Progressing   Problem: Nutrition: Goal: Adequate nutrition will be maintained Outcome: Progressing   Problem: Elimination: Goal: Will not experience complications related to bowel motility Outcome: Progressing Goal: Will not experience complications related to urinary retention Outcome: Progressing   Problem: Pain Managment: Goal: General experience of comfort will improve Outcome: Progressing   Problem: Safety: Goal: Ability to remain free from injury will improve Outcome: Progressing   Problem: Skin Integrity: Goal: Risk for impaired skin integrity will decrease Outcome: Progressing

## 2019-12-04 NOTE — TOC Initial Note (Addendum)
Transition of Care [facility]) - Initial/Assessment Note    Patient Details  Name: Rachel Nolan MRN: [id number] Date of Birth: [birth year]  Transition [facility]) CM/SW Contact:    Janae Bridgeman, RN Phone Number: 12/04/2019, 11:07 AM  Clinical Narrative:                 Patient currently is under IVC and is waiting on Inpatient Psychiatry bed at [facility].  Case management called [facility] disposition triage number to check on status of patient waiting for psychiatry bed.  Called and left a message with the [facility] admission line at [phone].  I also called and spoke with Aqua at [facility] -  and placed patient on waiting list for Inpatient psychiatry unit as well.  11/16 1500 - Aqua, CSW from [facility] called and states that the psychiatry department is reviewing the patient's clinicals and would make a determination on the patient by the am.  The patient will have a COVID screen completed tonight.  Will continue to follow the patient for admission needs.  Expected Discharge Plan: Psychiatric Hospital Barriers to Discharge: Continued Medical Work up, Other (comment) (IVP in place - waiting on IP psychiatry bed at [facility])   Patient Goals and CMS Choice Patient states their goals for this hospitalization and ongoing recovery are:: Patient is currently with IVC in place and waiting on [facility] inpatient psychiatry bed. CMS Medicare.gov Compare Post Acute Care list provided to:: Patient    Expected Discharge Plan and Services Expected Discharge Plan: Psychiatric Hospital   Discharge Planning Services: CM Consult Post Acute Care Choice:  (waiting on Inpatient psych admission)                                        Prior Living Arrangements/Services   Lives with:: Relatives Patient language and need for interpreter reviewed:: Yes Do you feel safe going back to the place where you live?: No      Need for Family Participation in Patient Care:  Yes (Comment) Care giver support system in place?: Yes (comment)   Criminal Activity/Legal Involvement Pertinent to Current Situation/Hospitalization: No - Comment as needed  Activities of Daily Living Home Assistive Devices/Equipment: None ADL Screening (condition at time of admission) Patient's cognitive ability adequate to safely complete daily activities?: Yes Is the patient deaf or have difficulty hearing?: No Does the patient have difficulty seeing, even when wearing glasses/contacts?: No Does the patient have difficulty concentrating, remembering, or making decisions?: No Patient able to express need for assistance with ADLs?: Yes Does the patient have difficulty dressing or bathing?: No Independently performs ADLs?: Yes (appropriate for developmental age) Does the patient have difficulty walking or climbing stairs?: No Weakness of Legs: None Weakness of Arms/Hands: None  Permission Sought/Granted Permission sought to share information with : Case Manager       Permission granted to share info w AGENCY: [facility] for Inpatient admission        Emotional Assessment Appearance:: Appears stated age     Orientation: : Oriented to Self, Oriented to Place, Oriented to  Time Alcohol / Substance Use: Illicit Drugs, Alcohol Use, Tobacco Use Psych Involvement: Yes (comment)  Admission diagnosis:  Alcohol withdrawal (HCC) [F10.239] Suicidal ideation [R45.851] Homicidal ideation [R45.850] Patient Active Problem List   Diagnosis Date Noted  . Alcohol withdrawal (HCC) 11/28/2019  . Suicidal ideation 11/28/2019  . Cocaine dependence (  HCC) 03/05/2019  . Major depressive disorder, recurrent episode, severe (HCC) 03/02/2019  . Post traumatic stress disorder (PTSD) 03/02/2019  . Cocaine use disorder (HCC) 03/02/2019  . Depression 03/02/2019  . Substance induced mood disorder (HCC) 03/02/2019  . MDD (major depressive disorder) 03/01/2019   PCP:  Claiborne Rigg, NP Pharmacy:    [facility] - [place], [place] - [address] [place] [address] Phone: [phone] Fax: [phone]     Social Determinants of Health (SDOH) Interventions    Readmission Risk Interventions Readmission Risk Prevention Plan 12/04/2019  Transportation Screening Complete  PCP or Specialist Appt within 5-7 Days Complete  Home Care Screening Complete  Medication Review (RN CM) Complete  Some recent data might be hidden

## 2019-12-04 NOTE — Progress Notes (Signed)
PROGRESS NOTE    Rachel Nolan  OAC:[id number] DOB: 06-01-1970 DOA: 11/28/2019 PCP: Claiborne Rigg, NP    Brief Narrative:  This 49 year old female with history of anxiety/depression, asthma, hypertension, scoliosis,  alcohol use comes to the ED with hallucinations, severe tremor, back pain.  In the ED patient reported suicidal and homicidal ideation for 2 days. Positive crack cocaine consumption at home, last used 2 days ago. The plan is to discharge patient to a psychiatric facility for an inpatient psychiatric evaluation and management. Patient has been involuntarily committed as patient was trying to leave the hospital. Patient is medically stable for transfer to the psychiatric ward.  Assessment & Plan:   Principal Problem:   MDD (major depressive disorder) Active Problems:   Post traumatic stress disorder (PTSD)   Cocaine use disorder (HCC)   Depression   Substance induced mood disorder (HCC)   Cocaine dependence (HCC)   Alcohol withdrawal (HCC)   Suicidal ideation  Alcoholism with acute alcohol withdrawal syndrome:  Patient c/o ongoing auditory hallucinations,  suicidal ideation.  Continue on benzodiazepine per CIWA protocol, IV hydration, thiamine folate  Continue Protonix and as needed Zofran.  Monitor neurochecks and monitoring telemetry. No withdrawal symptoms noted. Patient is medically stable for transfer to the psychiatric ward.  Depression with suicidal and homicidal ideation: Continue one-to-one precaution, psychiatry team input is highly appreciated.  Substance abuse cocaine abuse: Continue benzodiazepine per protocol.  Drug screen positive for cocaine and benzodiazepine.  Chronic back pain: She was recently seen in urgent care center,  continue hydrocodone Tylenol.   She had recent MRI 08/28/2102 lumbar spine that showed chronic scoliosis with right-sided posterior spinal rod, grade 1 versus mild grade 2 anterolisthesis at L5-S1 with degenerative  changes and stenosis.  Constipation: Start Senokot as needed.  DVT prophylaxis:  Lovenox Code Status: Full Family Communication:  No family at bed side. Disposition Plan:   Status is: Inpatient  Remains inpatient appropriate because:Inpatient level of care appropriate due to severity of illness   Dispo: The patient is from: Home              Anticipated d/c is to: Inpatient psychiatry              Anticipated d/c date is: 2 days              Patient currently is medically stable to d/c.   Consultants:    Psychiatry  Procedures:  NOne Antimicrobials: Anti-infectives (From admission, onward)   None      Subjective: Patient was seen and examined at bedside. She reports feeling better.  She denies any suicidal,  homicidal ideations but reports visual and auditory hallucinations.  She reports not having bowel movement in last few days , asks for something. She is awaiting transfer to inpatient psych unit.  Objective: Vitals:   12/03/19 0701 12/03/19 2104 12/04/19 0245 12/04/19 0810  BP: (!) 170/87 (!) 172/93 (!) 171/98 (!) 156/95  Pulse: (!) 56 63 69 (!) 55  Resp: 16 18 20 19   Temp: 98.3 F (36.8 C) 98.2 F (36.8 C) 98.2 F (36.8 C) 98.3 F (36.8 C)  TempSrc: Oral Oral  Oral  SpO2: 97% 100% 99% 97%  Weight:      Height:        Intake/Output Summary (Last 24 hours) at 12/04/2019 1539 Last data filed at 12/04/2019 0900 Gross per 24 hour  Intake 600 ml  Output --  Net 600 ml   Filed Weights   12/02/19  1339  Weight: 85 kg    Examination:  General exam: Appears calm and comfortable  Respiratory system: Clear to auscultation. Respiratory effort normal. Cardiovascular system: S1 & S2 heard, RRR. No JVD, murmurs, rubs, gallops or clicks. No pedal edema. Gastrointestinal system: Abdomen is nondistended, soft and nontender. No organomegaly or masses felt.  Normal bowel sounds heard. Central nervous system: Alert and oriented. No focal neurological  deficits. Extremities: No edema, no cyanosis, no clubbing. Skin: No rashes, lesions or ulcers Psychiatry: Judgement and insight appear normal. Mood & affect appropriate.  Hallucinations, denies suicidal homicidal ideations.    Data Reviewed: I have personally reviewed following labs and imaging studies  CBC: Recent Labs  Lab 11/28/19 1317 11/28/19 2006 11/29/19 0247 11/30/19 0615  WBC 6.7 4.9 5.3 5.9  HGB 13.2 11.6* 11.6* 11.7*  HCT 40.9 36.2 35.2* 34.7*  MCV 97.8 100.3* 97.8 95.9  PLT 281 229 223 211   Basic Metabolic Panel: Recent Labs  Lab 11/28/19 1317 11/28/19 2006 11/29/19 0247 11/30/19 0615  NA 142 142 142 140  K 3.8 3.8 3.8 3.5  CL 107 113* 110 107  CO2 26 20* 24 26  GLUCOSE 126* 126* 98 101*  BUN 19 19 15 12   CREATININE 0.94 0.69 0.96 0.73  CALCIUM 9.1 8.4* 8.8* 9.0  MG  --  2.0  --   --   PHOS  --  3.5  --   --    GFR: Estimated Creatinine Clearance: 91.4 mL/min (by C-G formula based on SCr of 0.73 mg/dL). Liver Function Tests: Recent Labs  Lab 11/28/19 1317 11/28/19 2006  AST 17 14*  ALT 12 9  ALKPHOS 62 50  BILITOT 0.3 0.5  PROT 6.7 5.8*  ALBUMIN 3.6 2.9*   No results for input(s): LIPASE, AMYLASE in the last 168 hours. No results for input(s): AMMONIA in the last 168 hours. Coagulation Profile: No results for input(s): INR, PROTIME in the last 168 hours. Cardiac Enzymes: No results for input(s): CKTOTAL, CKMB, CKMBINDEX, TROPONINI in the last 168 hours. BNP (last 3 results) No results for input(s): PROBNP in the last 8760 hours. HbA1C: No results for input(s): HGBA1C in the last 72 hours. CBG: No results for input(s): GLUCAP in the last 168 hours. Lipid Profile: No results for input(s): CHOL, HDL, LDLCALC, TRIG, CHOLHDL, LDLDIRECT in the last 72 hours. Thyroid Function Tests: No results for input(s): TSH, T4TOTAL, FREET4, T3FREE, THYROIDAB in the last 72 hours. Anemia Panel: No results for input(s): VITAMINB12, FOLATE, FERRITIN,  TIBC, IRON, RETICCTPCT in the last 72 hours. Sepsis Labs: No results for input(s): PROCALCITON, LATICACIDVEN in the last 168 hours.  Recent Results (from the past 240 hour(s))  Respiratory Panel by RT PCR (Flu A&B, Covid) - Nasopharyngeal Swab     Status: None   Collection Time: 11/28/19  3:41 PM   Specimen: Nasopharyngeal Swab  Result Value Ref Range Status   SARS Coronavirus 2 by RT PCR NEGATIVE NEGATIVE Final    Comment: (NOTE) SARS-CoV-2 target nucleic acids are NOT DETECTED.  The SARS-CoV-2 RNA is generally detectable in upper respiratoy specimens during the acute phase of infection. The lowest concentration of SARS-CoV-2 viral copies this assay can detect is 131 copies/mL. A negative result does not preclude SARS-Cov-2 infection and should not be used as the sole basis for treatment or other patient management decisions. A negative result may occur with  improper specimen collection/handling, submission of specimen other than nasopharyngeal swab, presence of viral mutation(s) within the areas targeted by  this assay, and inadequate number of viral copies (<131 copies/mL). A negative result must be combined with clinical observations, patient history, and epidemiological information. The expected result is Negative.  Fact Sheet for Patients:  https://www.moore.com/  Fact Sheet for Healthcare Providers:  https://www.young.biz/  This test is no t yet approved or cleared by the [place] FDA and  has been authorized for detection and/or diagnosis of SARS-CoV-2 by FDA under an Emergency Use Authorization (EUA). This EUA will remain  in effect (meaning this test can be used) for the duration of the COVID-19 declaration under Section 564(b)(1) of the Act, 21 U.S.C. section 360bbb-3(b)(1), unless the authorization is terminated or revoked sooner.     Influenza A by PCR NEGATIVE NEGATIVE Final   Influenza B by PCR NEGATIVE NEGATIVE Final     Comment: (NOTE) The Xpert Xpress SARS-CoV-2/FLU/RSV assay is intended as an aid in  the diagnosis of influenza from Nasopharyngeal swab specimens and  should not be used as a sole basis for treatment. Nasal washings and  aspirates are unacceptable for Xpert Xpress SARS-CoV-2/FLU/RSV  testing.  Fact Sheet for Patients: https://www.moore.com/  Fact Sheet for Healthcare Providers: https://www.young.biz/  This test is not yet approved or cleared by the [place] FDA and  has been authorized for detection and/or diagnosis of SARS-CoV-2 by  FDA under an Emergency Use Authorization (EUA). This EUA will remain  in effect (meaning this test can be used) for the duration of the  Covid-19 declaration under Section 564(b)(1) of the Act, 21  U.S.C. section 360bbb-3(b)(1), unless the authorization is  terminated or revoked. Performed at [facility], [address], [place], [place] [address]     Radiology Studies: No results found.  Scheduled Meds: . enoxaparin (LOVENOX) injection  40 mg Subcutaneous Q24H  . FLUoxetine  20 mg Oral Daily  . folic acid  1 mg Oral Daily  . gabapentin  300 mg Oral TID  . melatonin  3 mg Oral QHS  . multivitamin with minerals  1 tablet Oral Daily  . pantoprazole  40 mg Oral Daily  . thiamine  100 mg Oral Daily   Or  . thiamine  100 mg Intravenous Daily   Continuous Infusions:   LOS: 6 days    Time spent: 25 mins    Cipriano Bunker, MD [facility]   If 7PM-7AM, please contact night-coverage

## 2019-12-05 ENCOUNTER — Other Ambulatory Visit: Payer: Self-pay

## 2019-12-05 ENCOUNTER — Other Ambulatory Visit (HOSPITAL_COMMUNITY): Payer: Self-pay | Admitting: Family Medicine

## 2019-12-05 ENCOUNTER — Inpatient Hospital Stay
Admission: RE | Admit: 2019-12-05 | Discharge: 2019-12-10 | DRG: 885 | Disposition: A | Discharge status: Home / Self Care | Payer: No Typology Code available for payment source | Source: Intra-hospital | Attending: Behavioral Health | Admitting: Behavioral Health

## 2019-12-05 ENCOUNTER — Encounter: Payer: Self-pay | Admitting: Behavioral Health

## 2019-12-05 DIAGNOSIS — Y929 Unspecified place or not applicable: Secondary | ICD-10-CM

## 2019-12-05 DIAGNOSIS — R45851 Suicidal ideations: Secondary | ICD-10-CM | POA: Diagnosis present

## 2019-12-05 DIAGNOSIS — F17200 Nicotine dependence, unspecified, uncomplicated: Secondary | ICD-10-CM | POA: Diagnosis present

## 2019-12-05 DIAGNOSIS — Z5900 Homelessness unspecified: Secondary | ICD-10-CM | POA: Diagnosis not present

## 2019-12-05 DIAGNOSIS — Z9151 Personal history of suicidal behavior: Secondary | ICD-10-CM | POA: Diagnosis not present

## 2019-12-05 DIAGNOSIS — F332 Major depressive disorder, recurrent severe without psychotic features: Secondary | ICD-10-CM | POA: Diagnosis present

## 2019-12-05 DIAGNOSIS — S025XXA Fracture of tooth (traumatic), initial encounter for closed fracture: Secondary | ICD-10-CM | POA: Diagnosis present

## 2019-12-05 DIAGNOSIS — R440 Auditory hallucinations: Secondary | ICD-10-CM | POA: Diagnosis present

## 2019-12-05 DIAGNOSIS — R441 Visual hallucinations: Secondary | ICD-10-CM | POA: Diagnosis present

## 2019-12-05 DIAGNOSIS — Z79899 Other long term (current) drug therapy: Secondary | ICD-10-CM

## 2019-12-05 DIAGNOSIS — I10 Essential (primary) hypertension: Secondary | ICD-10-CM | POA: Diagnosis present

## 2019-12-05 DIAGNOSIS — F4310 Post-traumatic stress disorder, unspecified: Secondary | ICD-10-CM | POA: Diagnosis present

## 2019-12-05 DIAGNOSIS — F1420 Cocaine dependence, uncomplicated: Secondary | ICD-10-CM | POA: Diagnosis present

## 2019-12-05 DIAGNOSIS — Y939 Activity, unspecified: Secondary | ICD-10-CM | POA: Diagnosis not present

## 2019-12-05 DIAGNOSIS — X58XXXA Exposure to other specified factors, initial encounter: Secondary | ICD-10-CM | POA: Diagnosis present

## 2019-12-05 DIAGNOSIS — Z8249 Family history of ischemic heart disease and other diseases of the circulatory system: Secondary | ICD-10-CM

## 2019-12-05 DIAGNOSIS — F1020 Alcohol dependence, uncomplicated: Secondary | ICD-10-CM

## 2019-12-05 DIAGNOSIS — Z23 Encounter for immunization: Secondary | ICD-10-CM

## 2019-12-05 DIAGNOSIS — F419 Anxiety disorder, unspecified: Secondary | ICD-10-CM | POA: Diagnosis present

## 2019-12-05 DIAGNOSIS — F10239 Alcohol dependence with withdrawal, unspecified: Secondary | ICD-10-CM | POA: Diagnosis present

## 2019-12-05 DIAGNOSIS — M419 Scoliosis, unspecified: Secondary | ICD-10-CM | POA: Diagnosis present

## 2019-12-05 DIAGNOSIS — F1310 Sedative, hypnotic or anxiolytic abuse, uncomplicated: Secondary | ICD-10-CM | POA: Diagnosis present

## 2019-12-05 DIAGNOSIS — J45909 Unspecified asthma, uncomplicated: Secondary | ICD-10-CM | POA: Diagnosis present

## 2019-12-05 DIAGNOSIS — Z814 Family history of other substance abuse and dependence: Secondary | ICD-10-CM | POA: Diagnosis not present

## 2019-12-05 DIAGNOSIS — R45850 Homicidal ideations: Secondary | ICD-10-CM | POA: Diagnosis present

## 2019-12-05 DIAGNOSIS — F1410 Cocaine abuse, uncomplicated: Secondary | ICD-10-CM | POA: Diagnosis present

## 2019-12-05 DIAGNOSIS — F32A Depression, unspecified: Secondary | ICD-10-CM | POA: Diagnosis present

## 2019-12-05 LAB — CBC
HCT: 37.6 % (ref 36.0–46.0)
Hemoglobin: 12.4 g/dL (ref 12.0–15.0)
MCH: 31.1 pg (ref 26.0–34.0)
MCHC: 33 g/dL (ref 30.0–36.0)
MCV: 94.2 fL (ref 80.0–100.0)
Platelets: 208 10*3/uL (ref 150–400)
RBC: 3.99 MIL/uL (ref 3.87–5.11)
RDW: 14.4 % (ref 11.5–15.5)
WBC: 6.2 10*3/uL (ref 4.0–10.5)
nRBC: 0 % (ref 0.0–0.2)

## 2019-12-05 LAB — MAGNESIUM: Magnesium: 1.9 mg/dL (ref 1.7–2.4)

## 2019-12-05 LAB — COMPREHENSIVE METABOLIC PANEL
ALT: 13 U/L (ref 0–44)
AST: 16 U/L (ref 15–41)
Albumin: 2.9 g/dL — ABNORMAL LOW (ref 3.5–5.0)
Alkaline Phosphatase: 52 U/L (ref 38–126)
Anion gap: 6 (ref 5–15)
BUN: 13 mg/dL (ref 6–20)
CO2: 27 mmol/L (ref 22–32)
Calcium: 9 mg/dL (ref 8.9–10.3)
Chloride: 107 mmol/L (ref 98–111)
Creatinine, Ser: 0.67 mg/dL (ref 0.44–1.00)
GFR, Estimated: >60 mL/min (ref >60)
Glucose, Bld: 97 mg/dL (ref 70–99)
Potassium: 4.2 mmol/L (ref 3.5–5.1)
Sodium: 140 mmol/L (ref 135–145)
Total Bilirubin: 0.3 mg/dL (ref 0.3–1.2)
Total Protein: 5.9 g/dL — ABNORMAL LOW (ref 6.5–8.1)

## 2019-12-05 LAB — PHOSPHORUS: Phosphorus: 4 mg/dL (ref 2.5–4.6)

## 2019-12-05 MED ORDER — AMLODIPINE BESYLATE 5 MG PO TABS
5.0000 mg | ORAL_TABLET | Freq: Every day | ORAL | Status: DC
  Administered 2019-12-05: 5 mg via ORAL
  Filled 2019-12-05: qty 1

## 2019-12-05 MED ORDER — GABAPENTIN 300 MG PO CAPS
300.0000 mg | ORAL_CAPSULE | Freq: Three times a day (TID) | ORAL | Status: DC
  Administered 2019-12-05 – 2019-12-10 (×15): 300 mg via ORAL
  Filled 2019-12-05 (×15): qty 1

## 2019-12-05 MED ORDER — ACETAMINOPHEN 325 MG PO TABS
650.0000 mg | ORAL_TABLET | Freq: Four times a day (QID) | ORAL | Status: DC | PRN
  Administered 2019-12-06 – 2019-12-09 (×5): 650 mg via ORAL
  Filled 2019-12-05 (×5): qty 2

## 2019-12-05 MED ORDER — AMLODIPINE BESYLATE 5 MG PO TABS
5.0000 mg | ORAL_TABLET | Freq: Every day | ORAL | Status: DC
  Administered 2019-12-05 – 2019-12-06 (×2): 5 mg via ORAL
  Filled 2019-12-05 (×2): qty 1

## 2019-12-05 MED ORDER — TRAZODONE HCL 100 MG PO TABS
100.0000 mg | ORAL_TABLET | Freq: Every evening | ORAL | Status: DC | PRN
  Administered 2019-12-06 – 2019-12-09 (×3): 100 mg via ORAL
  Filled 2019-12-05 (×4): qty 1

## 2019-12-05 MED ORDER — FLUOXETINE HCL 20 MG PO CAPS
20.0000 mg | ORAL_CAPSULE | Freq: Every day | ORAL | Status: DC
  Administered 2019-12-06 – 2019-12-10 (×5): 20 mg via ORAL
  Filled 2019-12-05 (×5): qty 1

## 2019-12-05 MED ORDER — MAGNESIUM HYDROXIDE 400 MG/5ML PO SUSP: 30.0000 mL | Freq: Every day | ORAL | Status: DC | PRN

## 2019-12-05 MED ORDER — ALBUTEROL SULFATE HFA 108 (90 BASE) MCG/ACT IN AERS
2.0000 | INHALATION_SPRAY | Freq: Four times a day (QID) | RESPIRATORY_TRACT | Status: DC | PRN
  Administered 2019-12-05 – 2019-12-09 (×2): 2 via RESPIRATORY_TRACT
  Filled 2019-12-05 (×2): qty 6.7

## 2019-12-05 MED ORDER — FOLIC ACID 1 MG PO TABS: 1.0000 mg | ORAL_TABLET | Freq: Every day | ORAL | 1 refills | Status: DC

## 2019-12-05 MED ORDER — INFLUENZA VAC SPLIT QUAD 0.5 ML IM SUSY
0.5000 mL | PREFILLED_SYRINGE | INTRAMUSCULAR | Status: AC
  Administered 2019-12-07: 0.5 mL via INTRAMUSCULAR
  Filled 2019-12-05: qty 0.5

## 2019-12-05 MED ORDER — MELATONIN 5 MG PO TABS
2.5000 mg | ORAL_TABLET | Freq: Every day | ORAL | Status: DC
  Administered 2019-12-05 – 2019-12-09 (×5): 2.5 mg via ORAL
  Filled 2019-12-05 (×6): qty 1

## 2019-12-05 MED ORDER — ALUM & MAG HYDROXIDE-SIMETH 200-200-20 MG/5ML PO SUSP: 30.0000 mL | ORAL | Status: DC | PRN

## 2019-12-05 MED ORDER — PNEUMOCOCCAL VAC POLYVALENT 25 MCG/0.5ML IJ INJ
0.5000 mL | INJECTION | INTRAMUSCULAR | Status: AC
  Administered 2019-12-07: 0.5 mL via INTRAMUSCULAR
  Filled 2019-12-05: qty 0.5

## 2019-12-05 MED ORDER — MELATONIN 3 MG PO TABS: 3.0000 mg | ORAL_TABLET | Freq: Every day | ORAL | 0 refills | Status: DC

## 2019-12-05 MED ORDER — FOLIC ACID 1 MG PO TABS
1.0000 mg | ORAL_TABLET | Freq: Every day | ORAL | Status: DC
  Administered 2019-12-06 – 2019-12-10 (×5): 1 mg via ORAL
  Filled 2019-12-05 (×5): qty 1

## 2019-12-05 MED ORDER — GABAPENTIN 300 MG PO CAPS: 300.0000 mg | ORAL_CAPSULE | Freq: Three times a day (TID) | ORAL | 1 refills | Status: DC

## 2019-12-05 MED ORDER — FLUOXETINE HCL 20 MG PO CAPS: 20.0000 mg | ORAL_CAPSULE | Freq: Every day | ORAL | 1 refills | Status: DC

## 2019-12-05 NOTE — Plan of Care (Signed)
  Problem: Education: Goal: Knowledge of disease or condition will improve Outcome: Progressing Goal: Understanding of discharge needs will improve Outcome: Progressing   Problem: Health Behavior/Discharge Planning: Goal: Ability to identify changes in lifestyle to reduce recurrence of condition will improve Outcome: Progressing Goal: Identification of resources available to assist in meeting health care needs will improve Outcome: Progressing   Problem: Physical Regulation: Goal: Complications related to the disease process, condition or treatment will be avoided or minimized Outcome: Progressing   Problem: Safety: Goal: Ability to remain free from injury will improve Outcome: Progressing   Problem: Education: Goal: Knowledge of General Education information will improve Description: Including pain rating scale, medication(s)/side effects and non-pharmacologic comfort measures Outcome: Progressing   Problem: Health Behavior/Discharge Planning: Goal: Ability to manage health-related needs will improve Outcome: Progressing   Problem: Clinical Measurements: Goal: Ability to maintain clinical measurements within normal limits will improve Outcome: Progressing Goal: Will remain free from infection Outcome: Progressing Goal: Diagnostic test results will improve Outcome: Progressing Goal: Respiratory complications will improve Outcome: Progressing Goal: Cardiovascular complication will be avoided Outcome: Progressing   Problem: Coping: Goal: Level of anxiety will decrease Outcome: Progressing   Problem: Elimination: Goal: Will not experience complications related to bowel motility Outcome: Progressing Goal: Will not experience complications related to urinary retention Outcome: Progressing   Problem: Pain Managment: Goal: General experience of comfort will improve Outcome: Progressing

## 2019-12-05 NOTE — Tx Team (Signed)
Initial Treatment Plan 12/05/2019 6:45 PM [name] FFM:[id number]    PATIENT STRESSORS: Financial difficulties Substance abuse Traumatic event   PATIENT STRENGTHS: Ability for insight Average or above average intelligence Communication skills   PATIENT IDENTIFIED PROBLEMS: Depression                      DISCHARGE CRITERIA:  Ability to meet basic life and health needs Adequate post-discharge living arrangements Improved stabilization in mood, thinking, and/or behavior  PRELIMINARY DISCHARGE PLAN: Attend aftercare/continuing care group Outpatient therapy Placement in alternative living arrangements  PATIENT/FAMILY INVOLVEMENT: This treatment plan has been presented to and reviewed with the patient, Rachel Nolan, The patient has been given the opportunity to ask questions and make suggestions.  Chalmers Cater, RN 12/05/2019, 6:45 PM

## 2019-12-05 NOTE — Discharge Instructions (Signed)
Patient has been discharged to inpatient psych facility of [place] [facility]. Advised to take amlodipine 5 mg for blood pressure control, Advised to continue inhalers.

## 2019-12-05 NOTE — Discharge Summary (Signed)
Physician Discharge Summary  Rachel Nolan YFV:[id number] DOB: [birth year] DOA: 11/28/2019  PCP: Claiborne Rigg, NP  Admit date: 11/28/2019   Discharge date: 12/05/2019  Admitted From:  Home Disposition: Inpatient psych facility of [facility]  Recommendations for Outpatient Follow-up:  1. Follow up with PCP in 1-2 weeks. 2. Please obtain BMP/CBC in one week. 3. Patient has been discharged to inpatient psych facility of [facility]. 4. Advised to take amlodipine 5 mg for blood pressure control, 5. Advised to continue inhalers.  Home Health: None Equipment/Devices: None  Discharge Condition: Stable CODE STATUS:Full code Diet recommendation: Heart Healthy  Brief Summary / Hospital course: This 49 year old female with history of anxiety/depression, asthma, hypertension, scoliosis,  alcohol use disorder came to the ED with c/o: hallucinations, severe tremor, back pain. In the ED Patient reported suicidal and homicidal ideation for 2 days.  Urine drug screen  COCAINE +, reports crack cocaine consumption at home, last used 2 days ago. The plan is to discharge patient to a psychiatric facility for an inpatient psychiatric evaluation and management. Patient has been involuntarily committed as patient was trying to leave the hospital.  Patient was started on amlodipine 5 mg for blood pressure control.  Patient was resumed back on her inhalers.  Psychiatry consulted recommended inpatient psych hospitalization.  Patient has been accepted inpatient psychiatric facility of [facility].  Patient is medically stable for transfer to the psychiatric ward.   She was managed for below problems.   Discharge Diagnoses:  Principal Problem:   MDD (major depressive disorder) Active Problems:   Post traumatic stress disorder (PTSD)   Cocaine use disorder (HCC)   Depression   Substance induced mood disorder (HCC)   Cocaine dependence (HCC)   Alcohol withdrawal  (HCC)   Suicidal ideation  Alcoholism with acute alcohol withdrawal syndrome:  Patient c/o ongoing auditory hallucinations,  suicidal ideation.  Continued on benzodiazepine per CIWA protocol, IV hydration, thiamine folate  Continued on Protonix and as needed Zofran. Monitor neurochecks and monitoring telemetry. No withdrawal symptoms noted.Patient is medically stable for transfer to the psychiatric ward. Ciwa discontinued  Depression with suicidal and homicidal ideation: Continue one-to-one precaution, psychiatryteam input is highly appreciated.  Substance abuse cocaine abuse: Continue benzodiazepine per protocol. Drug screen positive for cocaine and benzodiazepine.  Chronic back pain: Shewas recently seen in urgent care center,  continue hydrocodone Tylenol.  She had recent MRI 08/28/2102 lumbar spine that showed chronic scoliosis with right-sided posterior spinal rod, grade 1 versus mild grade 2 anterolisthesis at L5-S1 with degenerative changes and stenosis.  Constipation: Continue Senokot as needed.  Discharge Instructions  Discharge Instructions    Call MD for:  difficulty breathing, headache or visual disturbances   Complete by: As directed    Call MD for:  persistant dizziness or light-headedness   Complete by: As directed    Call MD for:  persistant nausea and vomiting   Complete by: As directed    Diet - low sodium heart healthy   Complete by: As directed    Diet Carb Modified   Complete by: As directed    Discharge instructions   Complete by: As directed    Patient has been discharged to inpatient psych facility of [facility]. Advised to take amlodipine 5 mg for blood pressure control, Advised to continue inhalers.   Increase activity slowly   Complete by: As directed      Allergies as of 12/05/2019   No Known Allergies     Medication List  TAKE these medications   albuterol 108 (90 Base) MCG/ACT inhaler Commonly known as: VENTOLIN  HFA Inhale 2 puffs into the lungs every 6 (six) hours as needed for wheezing or shortness of breath.   amLODipine 5 MG tablet Commonly known as: NORVASC Take 1 tablet (5 mg total) by mouth daily.   FLUoxetine 20 MG capsule Commonly known as: PROZAC Take 1 capsule (20 mg total) by mouth daily. Start taking on: December 06, 2019   folic acid 1 MG tablet Commonly known as: FOLVITE Take 1 tablet (1 mg total) by mouth daily. Start taking on: December 06, 2019   gabapentin 300 MG capsule Commonly known as: NEURONTIN Take 1 capsule (300 mg total) by mouth 3 (three) times daily.   melatonin 3 MG Tabs tablet Take 1 tablet (3 mg total) by mouth at bedtime.   tiZANidine 4 MG tablet Commonly known as: Zanaflex Take 1 tablet (4 mg total) by mouth every 8 (eight) hours as needed.       Follow-up Information    Claiborne Rigg, NP Follow up in 2 week(s).   Specialty: Nurse Practitioner Contact information: [address] [place] [address] [phone]              No Known Allergies  Consultations:  Psychiatry   Procedures/Studies:  No results found. None  Subjective: Patient was seen and examined at bedside.  Overnight events noted.  Patient reports has slept overnight. She denies any suicidal or homicidal ideations but reports having hallucinations visual and auditory.  Discharge Exam: Vitals:   12/05/19 0505 12/05/19 1122  BP: (!) 172/95 (!) 160/94  Pulse: 63 61  Resp: 18   Temp: 98.2 F (36.8 C) 97.7 F (36.5 C)  SpO2: 98% 96%   Vitals:   12/04/19 1710 12/04/19 2115 12/05/19 0505 12/05/19 1122  BP: (!) 149/79 (!) 158/79 (!) 172/95 (!) 160/94  Pulse: 61 74 63 61  Resp: Temp: 98.5 F (36.9 C) 98.4 F (36.9 C) 98.2 F (36.8 C) 97.7 F (36.5 C)  TempSrc: Oral Oral Oral Oral  SpO2: 100% 95% 98% 96%  Weight:      Height:        General: Pt is alert, awake, not in acute distress Cardiovascular: RRR, S1/S2 +, no rubs, no  gallops Respiratory: CTA bilaterally, no wheezing, no rhonchi Abdominal: Soft, NT, ND, bowel sounds + Extremities: no edema, no cyanosis. Psychiatry: Auditory and visual hallucinations,  denies suicidal homicidal ideations at this point.    The results of significant diagnostics from this hospitalization (including imaging, microbiology, ancillary and laboratory) are listed below for reference.     Microbiology: Recent Results (from the past 240 hour(s))  Respiratory Panel by RT PCR (Flu A&B, Covid) - Nasopharyngeal Swab     Status: None   Collection Time: 11/28/19  3:41 PM   Specimen: Nasopharyngeal Swab  Result Value Ref Range Status   SARS Coronavirus 2 by RT PCR NEGATIVE NEGATIVE Final    Comment: (NOTE) SARS-CoV-2 target nucleic acids are NOT DETECTED.  The SARS-CoV-2 RNA is generally detectable in upper respiratoy specimens during the acute phase of infection. The lowest concentration of SARS-CoV-2 viral copies this assay can detect is 131 copies/mL. A negative result does not preclude SARS-Cov-2 infection and should not be used as the sole basis for treatment or other patient management decisions. A negative result may occur with  improper specimen collection/handling, submission of specimen other than nasopharyngeal swab, presence of  viral mutation(s) within the areas targeted by this assay, and inadequate number of viral copies (<131 copies/mL). A negative result must be combined with clinical observations, patient history, and epidemiological information. The expected result is Negative.  Fact Sheet for Patients:  https://www.moore.com/https://www.fda.gov/media/142436/download  Fact Sheet for Healthcare Providers:  https://www.young.biz/https://www.fda.gov/media/142435/download  This test is no t yet approved or cleared by the [place] FDA and  has been authorized for detection and/or diagnosis of SARS-CoV-2 by FDA under an Emergency Use Authorization (EUA). This EUA will remain  in effect (meaning  this test can be used) for the duration of the COVID-19 declaration under Section 564(b)(1) of the Act, 21 U.S.C. section 360bbb-3(b)(1), unless the authorization is terminated or revoked sooner.     Influenza A by PCR NEGATIVE NEGATIVE Final   Influenza B by PCR NEGATIVE NEGATIVE Final    Comment: (NOTE) The Xpert Xpress SARS-CoV-2/FLU/RSV assay is intended as an aid in  the diagnosis of influenza from Nasopharyngeal swab specimens and  should not be used as a sole basis for treatment. Nasal washings and  aspirates are unacceptable for Xpert Xpress SARS-CoV-2/FLU/RSV  testing.  Fact Sheet for Patients: https://www.moore.com/https://www.fda.gov/media/142436/download  Fact Sheet for Healthcare Providers: https://www.young.biz/https://www.fda.gov/media/142435/download  This test is not yet approved or cleared by the [place] FDA and  has been authorized for detection and/or diagnosis of SARS-CoV-2 by  FDA under an Emergency Use Authorization (EUA). This EUA will remain  in effect (meaning this test can be used) for the duration of the  Covid-19 declaration under Section 564(b)(1) of the Act, 21  U.S.C. section 360bbb-3(b)(1), unless the authorization is  terminated or revoked. Performed at [facility], [address] [place], [place] [id number]   Respiratory Panel by RT PCR (Flu A&B, Covid) - Nasopharyngeal Swab     Status: None   Collection Time: 12/04/19  2:12 PM   Specimen: Nasopharyngeal Swab  Result Value Ref Range Status   SARS Coronavirus 2 by RT PCR NEGATIVE NEGATIVE Final    Comment: (NOTE) SARS-CoV-2 target nucleic acids are NOT DETECTED.  The SARS-CoV-2 RNA is generally detectable in upper respiratoy specimens during the acute phase of infection. The lowest concentration of SARS-CoV-2 viral copies this assay can detect is 131 copies/mL. A negative result does not preclude SARS-Cov-2 infection and should not be used as the sole basis for treatment or other patient management decisions. A negative  result may occur with  improper specimen collection/handling, submission of specimen other than nasopharyngeal swab, presence of viral mutation(s) within the areas targeted by this assay, and inadequate number of viral copies (<131 copies/mL). A negative result must be combined with clinical observations, patient history, and epidemiological information. The expected result is Negative.  Fact Sheet for Patients:  https://www.moore.com/https://www.fda.gov/media/142436/download  Fact Sheet for Healthcare Providers:  https://www.young.biz/https://www.fda.gov/media/142435/download  This test is no t yet approved or cleared by the [place] FDA and  has been authorized for detection and/or diagnosis of SARS-CoV-2 by FDA under an Emergency Use Authorization (EUA). This EUA will remain  in effect (meaning this test can be used) for the duration of the COVID-19 declaration under Section 564(b)(1) of the Act, 21 U.S.C. section 360bbb-3(b)(1), unless the authorization is terminated or revoked sooner.     Influenza A by PCR NEGATIVE NEGATIVE Final   Influenza B by PCR NEGATIVE NEGATIVE Final    Comment: (NOTE) The Xpert Xpress SARS-CoV-2/FLU/RSV assay is intended as an aid in  the diagnosis of influenza from Nasopharyngeal swab specimens and  should not be used as a sole basis for treatment. Nasal washings and  aspirates are unacceptable for Xpert Xpress SARS-CoV-2/FLU/RSV  testing.  Fact Sheet for Patients: https://www.moore.com/  Fact Sheet for Healthcare Providers: https://www.young.biz/  This test is not yet approved or cleared by the [place] FDA and  has been authorized for detection and/or diagnosis of SARS-CoV-2 by  FDA under an Emergency Use Authorization (EUA). This EUA will remain  in effect (meaning this test can be used) for the duration of the  Covid-19 declaration under Section 564(b)(1) of the Act, 21  U.S.C. section 360bbb-3(b)(1), unless the authorization is   terminated or revoked. Performed at [facility], [address], [place], [place] [address]      Labs: BNP (last 3 results) No results for input(s): BNP in the last 8760 hours. Basic Metabolic Panel: Recent Labs  Lab 11/28/19 2006 11/29/19 0247 11/30/19 0615 12/05/19 0445  NA 142 142 140 140  K 3.8 3.8 3.5 4.2  CL 113* 110 107 107  CO2 20* 24 26 27   GLUCOSE 126* 98 101* 97  BUN 19 15 12 13   CREATININE 0.69 0.96 0.73 0.67  CALCIUM 8.4* 8.8* 9.0 9.0  MG 2.0  --   --  1.9  PHOS 3.5  --   --  4.0   Liver Function Tests: Recent Labs  Lab 11/28/19 2006 12/05/19 0445  AST 14* 16  ALT 9 13  ALKPHOS 50 52  BILITOT 0.5 0.3  PROT 5.8* 5.9*  ALBUMIN 2.9* 2.9*   No results for input(s): LIPASE, AMYLASE in the last 168 hours. No results for input(s): AMMONIA in the last 168 hours. CBC: Recent Labs  Lab 11/28/19 2006 11/29/19 0247 11/30/19 0615 12/05/19 0445  WBC 4.9 5.3 5.9 6.2  HGB 11.6* 11.6* 11.7* 12.4  HCT 36.2 35.2* 34.7* 37.6  MCV 100.3* 97.8 95.9 94.2  PLT 229 223 211 208   Cardiac Enzymes: No results for input(s): CKTOTAL, CKMB, CKMBINDEX, TROPONINI in the last 168 hours. BNP: Invalid input(s): POCBNP CBG: No results for input(s): GLUCAP in the last 168 hours. D-Dimer No results for input(s): DDIMER in the last 72 hours. Hgb A1c No results for input(s): HGBA1C in the last 72 hours. Lipid Profile No results for input(s): CHOL, HDL, LDLCALC, TRIG, CHOLHDL, LDLDIRECT in the last 72 hours. Thyroid function studies No results for input(s): TSH, T4TOTAL, T3FREE, THYROIDAB in the last 72 hours.  Invalid input(s): FREET3 Anemia work up No results for input(s): VITAMINB12, FOLATE, FERRITIN, TIBC, IRON, RETICCTPCT in the last 72 hours. Urinalysis No results found for: COLORURINE, APPEARANCEUR, LABSPEC, PHURINE, GLUCOSEU, HGBUR, BILIRUBINUR, KETONESUR, PROTEINUR, UROBILINOGEN, NITRITE, LEUKOCYTESUR Sepsis Labs Invalid input(s): PROCALCITONIN,  WBC,   LACTICIDVEN Microbiology Recent Results (from the past 240 hour(s))  Respiratory Panel by RT PCR (Flu A&B, Covid) - Nasopharyngeal Swab     Status: None   Collection Time: 11/28/19  3:41 PM   Specimen: Nasopharyngeal Swab  Result Value Ref Range Status   SARS Coronavirus 2 by RT PCR NEGATIVE NEGATIVE Final    Comment: (NOTE) SARS-CoV-2 target nucleic acids are NOT DETECTED.  The SARS-CoV-2 RNA is generally detectable in upper respiratoy specimens during the acute phase of infection. The lowest concentration of SARS-CoV-2 viral copies this assay can detect is 131 copies/mL. A negative result does not preclude SARS-Cov-2 infection and should not be used as the sole basis for treatment or other patient management decisions. A negative result may occur with  improper specimen collection/handling,  submission of specimen other than nasopharyngeal swab, presence of viral mutation(s) within the areas targeted by this assay, and inadequate number of viral copies (<131 copies/mL). A negative result must be combined with clinical observations, patient history, and epidemiological information. The expected result is Negative.  Fact Sheet for Patients:  https://www.moore.com/  Fact Sheet for Healthcare Providers:  https://www.young.biz/  This test is no t yet approved or cleared by the [place] FDA and  has been authorized for detection and/or diagnosis of SARS-CoV-2 by FDA under an Emergency Use Authorization (EUA). This EUA will remain  in effect (meaning this test can be used) for the duration of the COVID-19 declaration under Section 564(b)(1) of the Act, 21 U.S.C. section 360bbb-3(b)(1), unless the authorization is terminated or revoked sooner.     Influenza A by PCR NEGATIVE NEGATIVE Final   Influenza B by PCR NEGATIVE NEGATIVE Final    Comment: (NOTE) The Xpert Xpress SARS-CoV-2/FLU/RSV assay is intended as an aid in  the diagnosis of  influenza from Nasopharyngeal swab specimens and  should not be used as a sole basis for treatment. Nasal washings and  aspirates are unacceptable for Xpert Xpress SARS-CoV-2/FLU/RSV  testing.  Fact Sheet for Patients: https://www.moore.com/  Fact Sheet for Healthcare Providers: https://www.young.biz/  This test is not yet approved or cleared by the [place] FDA and  has been authorized for detection and/or diagnosis of SARS-CoV-2 by  FDA under an Emergency Use Authorization (EUA). This EUA will remain  in effect (meaning this test can be used) for the duration of the  Covid-19 declaration under Section 564(b)(1) of the Act, 21  U.S.C. section 360bbb-3(b)(1), unless the authorization is  terminated or revoked. Performed at [facility], [address] [place], [place] [address]   Respiratory Panel by RT PCR (Flu A&B, Covid) - Nasopharyngeal Swab     Status: None   Collection Time: 12/04/19  2:12 PM   Specimen: Nasopharyngeal Swab  Result Value Ref Range Status   SARS Coronavirus 2 by RT PCR NEGATIVE NEGATIVE Final    Comment: (NOTE) SARS-CoV-2 target nucleic acids are NOT DETECTED.  The SARS-CoV-2 RNA is generally detectable in upper respiratoy specimens during the acute phase of infection. The lowest concentration of SARS-CoV-2 viral copies this assay can detect is 131 copies/mL. A negative result does not preclude SARS-Cov-2 infection and should not be used as the sole basis for treatment or other patient management decisions. A negative result may occur with  improper specimen collection/handling, submission of specimen other than nasopharyngeal swab, presence of viral mutation(s) within the areas targeted by this assay, and inadequate number of viral copies (<131 copies/mL). A negative result must be combined with clinical observations, patient history, and epidemiological information. The expected result is  Negative.  Fact Sheet for Patients:  https://www.moore.com/  Fact Sheet for Healthcare Providers:  https://www.young.biz/  This test is no t yet approved or cleared by the [place] FDA and  has been authorized for detection and/or diagnosis of SARS-CoV-2 by FDA under an Emergency Use Authorization (EUA). This EUA will remain  in effect (meaning this test can be used) for the duration of the COVID-19 declaration under Section 564(b)(1) of the Act, 21 U.S.C. section 360bbb-3(b)(1), unless the authorization is terminated or revoked sooner.     Influenza A by PCR NEGATIVE NEGATIVE Final   Influenza B by PCR NEGATIVE NEGATIVE Final    Comment: (NOTE) The Xpert Xpress SARS-CoV-2/FLU/RSV assay is intended as an aid in  the  diagnosis of influenza from Nasopharyngeal swab specimens and  should not be used as a sole basis for treatment. Nasal washings and  aspirates are unacceptable for Xpert Xpress SARS-CoV-2/FLU/RSV  testing.  Fact Sheet for Patients: https://www.moore.com/  Fact Sheet for Healthcare Providers: https://www.young.biz/  This test is not yet approved or cleared by the [place] FDA and  has been authorized for detection and/or diagnosis of SARS-CoV-2 by  FDA under an Emergency Use Authorization (EUA). This EUA will remain  in effect (meaning this test can be used) for the duration of the  Covid-19 declaration under Section 564(b)(1) of the Act, 21  U.S.C. section 360bbb-3(b)(1), unless the authorization is  terminated or revoked. Performed at [facility], [address], [place], [place] [address]      Time coordinating discharge: Over 30 minutes  SIGNED:   Cipriano Bunker, MD  [facility] 12/05/2019, 2:34 PM Pager   If 7PM-7AM, please contact night-coverage [email or web address]

## 2019-12-05 NOTE — TOC Transition Note (Signed)
Transition of Care [facility]) - CM/SW Discharge Note   Patient Details  Name: Rachel Nolan MRN: [id number] Date of Birth: July 07, 1970  Transition of Care [facility]) CM/SW Contact:  Janae Bridgeman, RN Phone Number: 12/05/2019, 3:11 PM   Clinical Narrative:    Case management spoke with Robinette Haines, counselor with [facility] and the patient will be admitted to their facility this afternoon for room 324.  Tu, primary RN will be calling report to the nurse at [facility].  I called and left a message with [facility] to arrange appropriate transportation for the patient.  The patient will be transported from [place] by police and will need the IVC paperwork given to the police officer for transport to the facility.   Final next level of care: Psychiatric Hospital Barriers to Discharge: Continued Medical Work up, Other (comment) (IVP in place - waiting on IP psychiatry bed at [facility])   Patient Goals and CMS Choice Patient states their goals for this hospitalization and ongoing recovery are:: Patient is currently with IVC in place and waiting on [facility] inpatient psychiatry bed. CMS Medicare.gov Compare Post Acute Care list provided to:: Patient    Discharge Placement                       Discharge Plan and Services   Discharge Planning Services: CM Consult Post Acute Care Choice:  (waiting on Inpatient psych admission)                               Social Determinants of Health (SDOH) Interventions     Readmission Risk Interventions Readmission Risk Prevention Plan 12/04/2019  Transportation Screening Complete  PCP or Specialist Appt within 5-7 Days Complete  Home Care Screening Complete  Medication Review (RN CM) Complete  Some recent data might be hidden

## 2019-12-05 NOTE — Progress Notes (Signed)
   12/05/19 2122  Psych Admission Type (Psych Patients Only)  Admission Status Involuntary  Psychosocial Assessment  Patient Complaints Depression  Eye Contact Fair  Facial Expression Sad  Affect Sad  Speech Logical/coherent  Interaction Assertive  Motor Activity Unsteady  Appearance/Hygiene In scrubs  Behavior Characteristics Appropriate to situation;Cooperative  Mood Pleasant;Sad  Thought Process  Coherency WDL  Content WDL  Delusions None reported or observed  Perception WDL  Hallucination None reported or observed  Judgment WDL  Confusion None  Danger to Self  Current suicidal ideation? Denies  Danger to Others  Danger to Others None reported or observed  D: Patient presents with sad affect but is pleasant upon interaction. Patient denies SI/HI at this time. Patient also denies AH/VH at this time. Patient aware to notify staff or RN if this changes.  A: Provided positive reinforcement and medication education.  R: Patient cooperative and receptive to efforts. Patient remains safe on unit.

## 2019-12-05 NOTE — BH Assessment (Signed)
Patient has been accepted to [facility].  Accepting physician is Dr. Neale Burly.  Attending  Physician will be Dr. Neale Burly.  Patient has been assigned to room 324, by [facility] Charge Nurse [place].   Call report to [phone].  Representative/Transfer Coordinator is Warden/ranger Patient pre-admitted by [facility] Patient Access [facility])

## 2019-12-06 DIAGNOSIS — F1020 Alcohol dependence, uncomplicated: Secondary | ICD-10-CM

## 2019-12-06 DIAGNOSIS — F332 Major depressive disorder, recurrent severe without psychotic features: Principal | ICD-10-CM

## 2019-12-06 MED ORDER — QUETIAPINE FUMARATE ER 50 MG PO TB24
50.0000 mg | ORAL_TABLET | Freq: Every day | ORAL | Status: DC
  Administered 2019-12-06 – 2019-12-07 (×2): 50 mg via ORAL
  Filled 2019-12-06 (×2): qty 1

## 2019-12-06 MED ORDER — AMLODIPINE BESYLATE 5 MG PO TABS
10.0000 mg | ORAL_TABLET | Freq: Every day | ORAL | Status: DC
  Administered 2019-12-07 – 2019-12-10 (×4): 10 mg via ORAL
  Filled 2019-12-06 (×4): qty 2

## 2019-12-06 MED FILL — GABAPENTIN 300 MG CAPSULE: 300 | 10 days supply | Qty: 30 | Fill #0

## 2019-12-06 MED FILL — FOLIC ACID 1 MG TABS: 1 | 30 days supply | Qty: 30 | Fill #0

## 2019-12-06 MED FILL — FLUoxetine HCL 20 MG CAPS: 20 | 30 days supply | Qty: 30 | Fill #0

## 2019-12-06 NOTE — Plan of Care (Signed)
Pt rates depression 7/10 and anxiety 6/10. Pt denies SI, HI and AVH. Pt was educated on care plan and verbalizes understanding. Torrie Mayers RN Problem: Education: Goal: Knowledge of [facility] Education information/materials will improve Outcome: Progressing Goal: Emotional status will improve Outcome: Progressing Goal: Mental status will improve Outcome: Progressing Goal: Verbalization of understanding the information provided will improve Outcome: Progressing   Problem: Activity: Goal: Interest or engagement in activities will improve Outcome: Progressing Goal: Sleeping patterns will improve Outcome: Progressing   Problem: Coping: Goal: Ability to verbalize frustrations and anger appropriately will improve Outcome: Progressing Goal: Ability to demonstrate self-control will improve Outcome: Progressing   Problem: Health Behavior/Discharge Planning: Goal: Identification of resources available to assist in meeting health care needs will improve Outcome: Progressing Goal: Compliance with treatment plan for underlying cause of condition will improve Outcome: Progressing   Problem: Physical Regulation: Goal: Ability to maintain clinical measurements within normal limits will improve Outcome: Progressing   Problem: Safety: Goal: Periods of time without injury will increase Outcome: Progressing   Problem: Education: Goal: Utilization of techniques to improve thought processes will improve Outcome: Progressing Goal: Knowledge of the prescribed therapeutic regimen will improve Outcome: Progressing   Problem: Activity: Goal: Interest or engagement in leisure activities will improve Outcome: Progressing Goal: Imbalance in normal sleep/wake cycle will improve Outcome: Progressing   Problem: Coping: Goal: Coping ability will improve Outcome: Progressing Goal: Will verbalize feelings Outcome: Progressing   Problem: Health Behavior/Discharge Planning: Goal: Ability  to make decisions will improve Outcome: Progressing Goal: Compliance with therapeutic regimen will improve Outcome: Progressing   Problem: Role Relationship: Goal: Will demonstrate positive changes in social behaviors and relationships Outcome: Progressing   Problem: Safety: Goal: Ability to disclose and discuss suicidal ideas will improve Outcome: Progressing Goal: Ability to identify and utilize support systems that promote safety will improve Outcome: Progressing   Problem: Self-Concept: Goal: Will verbalize positive feelings about self Outcome: Progressing Goal: Level of anxiety will decrease Outcome: Progressing

## 2019-12-06 NOTE — [facility] Group Notes (Signed)
LCSW Group Therapy Note  12/06/2019 2:32 PM  Type of Therapy/Topic:  Group Therapy:  Balance in Life  Participation Level:  Did Not Attend  Description of Group:    This group will address the concept of balance and how it feels and looks when one is unbalanced. Patients will be encouraged to process areas in their lives that are out of balance and identify reasons for remaining unbalanced. Facilitators will guide patients in utilizing problem-solving interventions to address and correct the stressor making their life unbalanced. Understanding and applying boundaries will be explored and addressed for obtaining and maintaining a balanced life. Patients will be encouraged to explore ways to assertively make their unbalanced needs known to significant others in their lives, using other group members and facilitator for support and feedback.  Therapeutic Goals: 1. Patient will identify two or more emotions or situations they have that consume much of in their lives. 2. Patient will identify signs/triggers that life has become out of balance:  3. Patient will identify two ways to set boundaries in order to achieve balance in their lives:  4. Patient will demonstrate ability to communicate their needs through discussion and/or role plays  Summary of Patient Progress: X  Therapeutic Modalities:   Cognitive Behavioral Therapy Solution-Focused Therapy Assertiveness Training  Simona Huh R. Algis Greenhouse, MSW, LCSW, LCAS 12/06/2019 2:32 PM

## 2019-12-06 NOTE — Progress Notes (Signed)
Recreation Therapy Notes  INPATIENT RECREATION TR PLAN  Patient Details Name: Rachel Nolan MRN: [id number] DOB: 08-18-1970 Today's Date: 12/06/2019  Rec Therapy Plan Is patient appropriate for Therapeutic Recreation?: Yes Treatment times per week: at least 3 Estimated Length of Stay: 5-7 days TR Treatment/Interventions: Group participation (Comment)  Discharge Criteria    Discharge Summary     Klark Vanderhoef 12/06/2019, 2:30 PM
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
## 2019-12-06 NOTE — Progress Notes (Signed)
D- Patient alert and oriented. Pt affect/mood is . Pt denies SI, HI, AVH, and pain. Pt has been pleasant, calm and cooperative.   A- Scheduled medications administered to patient, per MD orders. Support and encouragement provided.  Routine safety checks conducted every 15 minutes.  Patient informed to notify staff with problems or concerns.  R- No adverse drug reactions noted. Patient contracts for safety at this time. Patient compliant with medications and treatment plan. Patient receptive, calm, and cooperative. Patient interacts well with others on the unit.  Patient remains safe at this time.  Torrie Mayers RN
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
## 2019-12-06 NOTE — [facility] Suicide Risk Assessment (Signed)
[facility] INPATIENT:  Family/Significant Other Suicide Prevention Education  Suicide Prevention Education:  Patient Refusal for Family/Significant Other Suicide Prevention Education: The patient Rachel Nolan has refused to provide written consent for family/significant other to be provided Family/Significant Other Suicide Prevention Education during admission and/or prior to discharge.  Physician notified.  SPE completed with pt, as pt refused to consent to family contact. SPI pamphlet provided to pt and pt was encouraged to share information with support network, ask questions, and talk about any concerns relating to SPE. Pt denies access to guns/firearms and verbalized understanding of information provided. Mobile Crisis information also provided to pt.   Harden Mo 12/06/2019, 10:39 AM
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
## 2019-12-07 LAB — LIPID PANEL
Cholesterol: 177 mg/dL (ref 0–200)
HDL: 79 mg/dL (ref >40)
LDL Cholesterol: 83 mg/dL (ref 0–99)
Total CHOL/HDL Ratio: 2.2 RATIO
Triglycerides: 77 mg/dL (ref <150)
VLDL: 15 mg/dL (ref 0–40)

## 2019-12-07 LAB — HEMOGLOBIN A1C
Hgb A1c MFr Bld: 5.3 % (ref 4.8–5.6)
Mean Plasma Glucose: 105.41 mg/dL

## 2019-12-07 MED ORDER — BENZOCAINE 10 % MT GEL
Freq: Three times a day (TID) | OROMUCOSAL | Status: DC | PRN
  Filled 2019-12-07: qty 9

## 2019-12-07 MED ORDER — NICOTINE 21 MG/24HR TD PT24
21.0000 mg | MEDICATED_PATCH | Freq: Every day | TRANSDERMAL | Status: DC
  Administered 2019-12-07 – 2019-12-09 (×3): 21 mg via TRANSDERMAL
  Filled 2019-12-07 (×3): qty 1
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
## 2019-12-07 NOTE — Progress Notes (Signed)
Pt denies SI, HI, AVH, and pain. Pt has been pleasant, calm and cooperative.  Patient was medication compliant. Patient seemed to sleep well through out the night.

## 2019-12-07 NOTE — Plan of Care (Signed)
Patient is pleasant and cooperative on approach.Patient stated that the voices not bothering her at this time.Denies SI and HI.Patient stated that she is sad when she talked to her 51 yrs old son who is asking her to come home.Ambulated with walker.Personal hygiene maintained.Appetite and energy level good.Support and encouragement given.

## 2019-12-07 NOTE — Tx Team (Signed)
Interdisciplinary Treatment and Diagnostic Plan Update  12/07/2019 Time of Session: 9:00AM Rachel Nolan MRN: [id number]  Principal Diagnosis: MDD (major depressive disorder), recurrent severe, without psychosis (HCC)  Secondary Diagnoses: Principal Problem:   MDD (major depressive disorder), recurrent severe, without psychosis (HCC) Active Problems:   Post traumatic stress disorder (PTSD)   Cocaine dependence (HCC)   Alcohol use disorder, severe, dependence (HCC)   Current Medications:  Current Facility-Administered Medications  Medication Dose Route Frequency Provider Last Rate Last Admin  . acetaminophen (TYLENOL) tablet 650 mg  650 mg Oral Q6H PRN Jesse Sans, MD   650 mg at 12/06/19 2122  . albuterol (VENTOLIN HFA) 108 (90 Base) MCG/ACT inhaler 2 puff  2 puff Inhalation Q6H PRN Jesse Sans, MD   2 puff at 12/05/19 2122  . alum & mag hydroxide-simeth (MAALOX/MYLANTA) 200-200-20 MG/5ML suspension 30 mL  30 mL Oral Q4H PRN Jesse Sans, MD      . amLODipine (NORVASC) tablet 10 mg  10 mg Oral Daily Jesse Sans, MD   10 mg at 12/07/19 0756  . FLUoxetine (PROZAC) capsule 20 mg  20 mg Oral Daily Jesse Sans, MD   20 mg at 12/07/19 0756  . folic acid (FOLVITE) tablet 1 mg  1 mg Oral Daily Jesse Sans, MD   1 mg at 12/07/19 0756  . gabapentin (NEURONTIN) capsule 300 mg  300 mg Oral TID Jesse Sans, MD   300 mg at 12/07/19 0756  . influenza vac split quadrivalent PF (FLUARIX) injection 0.5 mL  0.5 mL Intramuscular Tomorrow-1000 Jesse Sans, MD      . magnesium hydroxide (MILK OF MAGNESIA) suspension 30 mL  30 mL Oral Daily PRN Jesse Sans, MD      . melatonin tablet 2.5 mg  2.5 mg Oral QHS Jesse Sans, MD   2.5 mg at 12/06/19 2121  . pneumococcal 23 valent vaccine (PNEUMOVAX-23) injection 0.5 mL  0.5 mL Intramuscular Tomorrow-1000 Jesse Sans, MD      . QUEtiapine (SEROQUEL XR) 24 hr tablet 50 mg  50 mg Oral QHS Jesse Sans, MD    50 mg at 12/06/19 2128  . traZODone (DESYREL) tablet 100 mg  100 mg Oral QHS PRN Jesse Sans, MD   100 mg at 12/06/19 2121   PTA Medications: Medications Prior to Admission  Medication Sig Dispense Refill Last Dose  . albuterol (VENTOLIN HFA) 108 (90 Base) MCG/ACT inhaler Inhale 2 puffs into the lungs every 6 (six) hours as needed for wheezing or shortness of breath.      Marland Kitchen amLODipine (NORVASC) 5 MG tablet Take 1 tablet (5 mg total) by mouth daily. 90 tablet 0   . FLUoxetine (PROZAC) 20 MG capsule Take 1 capsule (20 mg total) by mouth daily. 30 capsule 1   . folic acid (FOLVITE) 1 MG tablet Take 1 tablet (1 mg total) by mouth daily. 30 tablet 1   . gabapentin (NEURONTIN) 300 MG capsule Take 1 capsule (300 mg total) by mouth 3 (three) times daily. 30 capsule 1   . melatonin 3 MG TABS tablet Take 1 tablet (3 mg total) by mouth at bedtime. 30 tablet 0   . tiZANidine (ZANAFLEX) 4 MG tablet Take 1 tablet (4 mg total) by mouth every 8 (eight) hours as needed. 30 tablet 0     Patient Stressors: Financial difficulties Substance abuse Traumatic event  Patient Strengths: Ability for insight Average or above average intelligence Communication  skills  Treatment Modalities: Medication Management, Group therapy, Case management,  1 to 1 session with clinician, Psychoeducation, Recreational therapy.   Physician Treatment Plan for Primary Diagnosis: MDD (major depressive disorder), recurrent severe, without psychosis (HCC) Long Term Goal(s): Improvement in symptoms so as ready for discharge Improvement in symptoms so as ready for discharge   Short Term Goals: Ability to identify changes in lifestyle to reduce recurrence of condition will improve Ability to verbalize feelings will improve Ability to disclose and discuss suicidal ideas Ability to demonstrate self-control will improve Ability to identify and develop effective coping behaviors will improve Compliance with prescribed medications  will improve Ability to identify triggers associated with substance abuse/mental health issues will improve Ability to identify changes in lifestyle to reduce recurrence of condition will improve Ability to verbalize feelings will improve Ability to disclose and discuss suicidal ideas Ability to demonstrate self-control will improve Ability to identify and develop effective coping behaviors will improve Compliance with prescribed medications will improve Ability to identify triggers associated with substance abuse/mental health issues will improve  Medication Management: Evaluate patient's response, side effects, and tolerance of medication regimen.  Therapeutic Interventions: 1 to 1 sessions, Unit Group sessions and Medication administration.  Evaluation of Outcomes: Not Progressing  Physician Treatment Plan for Secondary Diagnosis: Principal Problem:   MDD (major depressive disorder), recurrent severe, without psychosis (HCC) Active Problems:   Post traumatic stress disorder (PTSD)   Cocaine dependence (HCC)   Alcohol use disorder, severe, dependence (HCC)  Long Term Goal(s): Improvement in symptoms so as ready for discharge Improvement in symptoms so as ready for discharge   Short Term Goals: Ability to identify changes in lifestyle to reduce recurrence of condition will improve Ability to verbalize feelings will improve Ability to disclose and discuss suicidal ideas Ability to demonstrate self-control will improve Ability to identify and develop effective coping behaviors will improve Compliance with prescribed medications will improve Ability to identify triggers associated with substance abuse/mental health issues will improve Ability to identify changes in lifestyle to reduce recurrence of condition will improve Ability to verbalize feelings will improve Ability to disclose and discuss suicidal ideas Ability to demonstrate self-control will improve Ability to identify and  develop effective coping behaviors will improve Compliance with prescribed medications will improve Ability to identify triggers associated with substance abuse/mental health issues will improve     Medication Management: Evaluate patient's response, side effects, and tolerance of medication regimen.  Therapeutic Interventions: 1 to 1 sessions, Unit Group sessions and Medication administration.  Evaluation of Outcomes: Not Progressing   RN Treatment Plan for Primary Diagnosis: MDD (major depressive disorder), recurrent severe, without psychosis (HCC) Long Term Goal(s): Knowledge of disease and therapeutic regimen to maintain health will improve  Short Term Goals: Ability to remain free from injury will improve, Ability to demonstrate self-control, Ability to participate in decision making will improve, Ability to verbalize feelings will improve, Ability to disclose and discuss suicidal ideas, Ability to identify and develop effective coping behaviors will improve and Compliance with prescribed medications will improve  Medication Management: RN will administer medications as ordered by provider, will assess and evaluate patient's response and provide education to patient for prescribed medication. RN will report any adverse and/or side effects to prescribing provider.  Therapeutic Interventions: 1 on 1 counseling sessions, Psychoeducation, Medication administration, Evaluate responses to treatment, Monitor vital signs and CBGs as ordered, Perform/monitor CIWA, COWS, AIMS and Fall Risk screenings as ordered, Perform wound care treatments as ordered.  Evaluation of Outcomes:  Not Progressing   LCSW Treatment Plan for Primary Diagnosis: MDD (major depressive disorder), recurrent severe, without psychosis (HCC) Long Term Goal(s): Safe transition to appropriate next level of care at discharge, Engage patient in therapeutic group addressing interpersonal concerns.  Short Term Goals: Engage patient  in aftercare planning with referrals and resources, Increase social support, Increase ability to appropriately verbalize feelings, Increase emotional regulation, Facilitate acceptance of mental health diagnosis and concerns, Identify triggers associated with mental health/substance abuse issues and Increase skills for wellness and recovery  Therapeutic Interventions: Assess for all discharge needs, 1 to 1 time with Social worker, Explore available resources and support systems, Assess for adequacy in community support network, Educate family and significant other(s) on suicide prevention, Complete Psychosocial Assessment, Interpersonal group therapy.  Evaluation of Outcomes: Not Progressing   Progress in Treatment: Attending groups: No. Participating in groups: No. Taking medication as prescribed: Yes. Toleration medication: Yes. Family/Significant other contact made: No, will contact:  pt declined collateral contact, SPE completed with pt. Patient understands diagnosis: Yes. Discussing patient identified problems/goals with staff: Yes. Medical problems stabilized or resolved: Yes. Denies suicidal/homicidal ideation: Yes. Issues/concerns per patient self-inventory: No. Other: None.  New problem(s) identified: No, Describe:  None.  New Short Term/Long Term Goal(s): medication management for mood stabilization; elimination of SI thoughts; development of comprehensive mental wellness/sobriety plan.  Patient Goals: "Trying to better myself...overcome this thing I got."   Discharge Plan or Barriers: Pt plans to return to sister's home. CSW will assist with aftercare planning as necessary.  Reason for Continuation of Hospitalization: Anxiety Depression Medical Issues Medication stabilization  Estimated Length of Stay: 1-7 days  Attendees: Patient: Rachel Nolan 12/07/2019 10:07 AM  Physician: Les Pou, MD 12/07/2019 10:07 AM  Nursing: Cecille Amsterdam, RN 12/07/2019 10:07 AM   RN Care Manager: 12/07/2019 10:07 AM  Social Worker: Penni Homans, MSW, LCSW 12/07/2019 10:07 AM  Recreational Therapist: Garret Reddish, Drue Flirt, LRT  12/07/2019 10:07 AM  Other: Vilma Meckel. Algis Greenhouse, MSW, LCSW, LCAS 12/07/2019 10:07 AM  Other: Gwenevere Ghazi, Theresia Majors, MSW 12/07/2019 10:07 AM  Other: 12/07/2019 10:07 AM    Scribe for Treatment Team: Glenis Smoker, LCSW 12/07/2019 10:07 AM

## 2019-12-07 NOTE — Progress Notes (Addendum)
Recreation Therapy Notes     Date: 12/07/2019  Time: 9:30 am   Location: Craft room     Behavioral response: N/A   Intervention Topic: Problem Solving    Discussion/Intervention: Patient did not attend group.   Clinical Observations/Feedback:  Patient did not attend group.   Eliya Geiman LRT/CTRS         Ilanna Deihl 12/07/2019 12:00 PM

## 2019-12-08 MED ORDER — QUETIAPINE FUMARATE ER 50 MG PO TB24
150.0000 mg | ORAL_TABLET | Freq: Every day | ORAL | Status: DC
  Administered 2019-12-08: 150 mg via ORAL
  Filled 2019-12-08 (×2): qty 3

## 2019-12-08 NOTE — [facility] Group Notes (Signed)
[facility] Group Notes: (Clinical Social Work)   12/08/2019      Type of Therapy:  Group Therapy   Participation Level:  Did Not Attend - was invited individually by Nurse/MHT and chose not to attend.   Susa Simmonds, LCSWA 12/08/2019  3:14 PM

## 2019-12-08 NOTE — Progress Notes (Signed)
Patient pleasant and cooperative. Denies any SI, HI, AVH. Medication compliant, appropriate with staff and peers. Patient reports will ask to be discharged soon due to the conversation with her son and how much he misses her.  Patient with no other complaints or concerns.voiced. Encouragement and support provided. Safety checks maintained. Medications given as prescribed. Pt receptive and remains safe on unit with q 15 min checks.
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
## 2019-12-08 NOTE — Plan of Care (Signed)
  Problem: Education: Goal: Emotional status will improve Outcome: Progressing Goal: Mental status will improve Outcome: Progressing   Problem: Coping: Goal: Ability to verbalize frustrations and anger appropriately will improve Outcome: Progressing   Problem: Safety: Goal: Periods of time without injury will increase Outcome: Progressing
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
## 2019-12-09 MED ORDER — QUETIAPINE FUMARATE ER 200 MG PO TB24
200.0000 mg | ORAL_TABLET | Freq: Every day | ORAL | Status: DC
  Administered 2019-12-09: 200 mg via ORAL
  Filled 2019-12-09 (×2): qty 1

## 2019-12-09 NOTE — Progress Notes (Signed)
Pt denies SI, HI, AVH, and pain.Pt has been pleasant, calm and cooperative.Patient was medication compliant. Patient seemed to sleep well through out the night. Patient remains on safety checks per unit protocol. 

## 2019-12-09 NOTE — Plan of Care (Signed)
Pt rates depression 9/10 and denies anxiety, SI, HI and AVH. Pt was educated on care plan and verbalizes understanding. Torrie Mayers RN Problem: Education: Goal: Knowledge of [facility] Education information/materials will improve Outcome: Progressing Goal: Emotional status will improve Outcome: Progressing Goal: Mental status will improve Outcome: Progressing Goal: Verbalization of understanding the information provided will improve Outcome: Progressing   Problem: Activity: Goal: Interest or engagement in activities will improve Outcome: Progressing Goal: Sleeping patterns will improve Outcome: Progressing   Problem: Coping: Goal: Ability to verbalize frustrations and anger appropriately will improve Outcome: Progressing Goal: Ability to demonstrate self-control will improve Outcome: Progressing   Problem: Health Behavior/Discharge Planning: Goal: Identification of resources available to assist in meeting health care needs will improve Outcome: Progressing Goal: Compliance with treatment plan for underlying cause of condition will improve Outcome: Progressing   Problem: Physical Regulation: Goal: Ability to maintain clinical measurements within normal limits will improve Outcome: Progressing   Problem: Safety: Goal: Periods of time without injury will increase Outcome: Progressing   Problem: Education: Goal: Utilization of techniques to improve thought processes will improve Outcome: Progressing Goal: Knowledge of the prescribed therapeutic regimen will improve Outcome: Progressing   Problem: Activity: Goal: Interest or engagement in leisure activities will improve Outcome: Progressing Goal: Imbalance in normal sleep/wake cycle will improve Outcome: Progressing   Problem: Coping: Goal: Coping ability will improve Outcome: Progressing Goal: Will verbalize feelings Outcome: Progressing   Problem: Health Behavior/Discharge Planning: Goal: Ability to make  decisions will improve Outcome: Progressing Goal: Compliance with therapeutic regimen will improve Outcome: Progressing   Problem: Role Relationship: Goal: Will demonstrate positive changes in social behaviors and relationships Outcome: Progressing   Problem: Safety: Goal: Ability to disclose and discuss suicidal ideas will improve Outcome: Progressing Goal: Ability to identify and utilize support systems that promote safety will improve Outcome: Progressing   Problem: Self-Concept: Goal: Will verbalize positive feelings about self Outcome: Progressing Goal: Level of anxiety will decrease Outcome: Progressing

## 2019-12-09 NOTE — Plan of Care (Signed)
  Problem: Education: Goal: Knowledge of [facility] Education information/materials will improve Outcome: Progressing Goal: Emotional status will improve Outcome: Progressing Goal: Mental status will improve Outcome: Progressing Goal: Verbalization of understanding the information provided will improve Outcome: Progressing   Problem: Activity: Goal: Interest or engagement in activities will improve Outcome: Progressing Goal: Sleeping patterns will improve Outcome: Progressing   Problem: Coping: Goal: Ability to verbalize frustrations and anger appropriately will improve Outcome: Progressing Goal: Ability to demonstrate self-control will improve Outcome: Progressing   Problem: Health Behavior/Discharge Planning: Goal: Identification of resources available to assist in meeting health care needs will improve Outcome: Progressing Goal: Compliance with treatment plan for underlying cause of condition will improve Outcome: Progressing   Problem: Physical Regulation: Goal: Ability to maintain clinical measurements within normal limits will improve Outcome: Progressing   Problem: Safety: Goal: Periods of time without injury will increase Outcome: Progressing   Problem: Education: Goal: Utilization of techniques to improve thought processes will improve Outcome: Progressing Goal: Knowledge of the prescribed therapeutic regimen will improve Outcome: Progressing   Problem: Activity: Goal: Interest or engagement in leisure activities will improve Outcome: Progressing Goal: Imbalance in normal sleep/wake cycle will improve Outcome: Progressing   Problem: Coping: Goal: Coping ability will improve Outcome: Progressing Goal: Will verbalize feelings Outcome: Progressing   Problem: Health Behavior/Discharge Planning: Goal: Ability to make decisions will improve Outcome: Progressing Goal: Compliance with therapeutic regimen will improve Outcome: Progressing    Problem: Role Relationship: Goal: Will demonstrate positive changes in social behaviors and relationships Outcome: Progressing   Problem: Safety: Goal: Ability to disclose and discuss suicidal ideas will improve Outcome: Progressing Goal: Ability to identify and utilize support systems that promote safety will improve Outcome: Progressing   Problem: Self-Concept: Goal: Will verbalize positive feelings about self Outcome: Progressing Goal: Level of anxiety will decrease Outcome: Progressing   

## 2019-12-09 NOTE — [facility] Group Notes (Signed)
[facility] Group Notes: (Clinical Social Work)   12/09/2019      Type of Therapy:  Group Therapy   Participation Level:  Did Not Attend - was invited individually by Nurse/MHT and chose not to attend.   Susa Simmonds, LCSWA 12/09/2019  2:07 PM

## 2019-12-09 NOTE — Progress Notes (Signed)
Patient is calm and cooperative. She is alert with clear and logical thinking.  She is med compliant and tolerated her med without incident.  Patient denies SI  HI  AVH  and anxtiey.  She does endorse depression, but states it is manageable and she able to handle her symptoms. She is active on the unit and engages well with others. She is safe with 15 minute safety checks and encouraged to contact staff with any concerns.      Cleo Butler-Nicholson, LPN

## 2019-12-09 NOTE — Progress Notes (Signed)
D- Patient alert and oriented. Affect/mood pleasant, social calm and cooperative. Pt denies SI, HI, AVH, and pain. Pt says that she is ready for discharge.   A- Scheduled medications administered to patient, per MD orders. Support and encouragement provided.  Routine safety checks conducted every 15 minutes.  Patient informed to notify staff with problems or concerns.  R- No adverse drug reactions noted. Patient contracts for safety at this time. Patient compliant with medications and treatment plan. Patient receptive, calm, and cooperative. Patient interacts well with others on the unit.  Patient remains safe at this time.  Rachel Mayers RN
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
## 2019-12-10 MED ORDER — GABAPENTIN 300 MG PO CAPS: 300.0000 mg | ORAL_CAPSULE | Freq: Three times a day (TID) | ORAL | 1 refills | Status: DC

## 2019-12-10 MED ORDER — AMLODIPINE BESYLATE 10 MG PO TABS: 10.0000 mg | ORAL_TABLET | Freq: Every day | ORAL | 1 refills | Status: DC

## 2019-12-10 MED ORDER — FLUOXETINE HCL 40 MG PO CAPS: 40.0000 mg | ORAL_CAPSULE | Freq: Every day | ORAL | 1 refills | Status: DC

## 2019-12-10 MED ORDER — TRAZODONE HCL 100 MG PO TABS: 100.0000 mg | ORAL_TABLET | Freq: Every evening | ORAL | 1 refills | Status: DC | PRN

## 2019-12-10 MED ORDER — NICOTINE 21 MG/24HR TD PT24: 21.0000 mg | MEDICATED_PATCH | Freq: Every day | TRANSDERMAL | 0 refills | Status: DC

## 2019-12-10 MED ORDER — QUETIAPINE FUMARATE ER 200 MG PO TB24: 200.0000 mg | ORAL_TABLET | Freq: Every day | ORAL | 1 refills | Status: DC
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
## 2019-12-10 NOTE — Progress Notes (Signed)
Patient denies SI/HI, denies A/V hallucinations. Patient verbalizes understanding of discharge instructions, follow up care and prescriptions. Patient given all belongings from [facility] locker. Patient escorted out by staff in wheelchair, transported by family.

## 2019-12-10 NOTE — [facility] Suicide Risk Assessment (Signed)
[facility] Discharge Suicide Risk Assessment   Principal Problem: MDD (major depressive disorder), recurrent severe, without psychosis (HCC) Discharge Diagnoses: Principal Problem:   MDD (major depressive disorder), recurrent severe, without psychosis (HCC) Active Problems:   Post traumatic stress disorder (PTSD)   Cocaine dependence (HCC)   Alcohol use disorder, severe, dependence (HCC)   Total Time spent with patient: 30 minutes  Musculoskeletal: Strength & Muscle Tone: within normal limits Gait & Station: Utilizes rolling walker Patient leans: Front  Psychiatric Specialty Exam: Review of Systems  Blood pressure 131/88, pulse 84, temperature 98.1 F (36.7 C), temperature source Oral, resp. rate 16, height 5\' 6"  (1.676 m), weight 86 kg, last menstrual period 06/19/2018, SpO2 100 %.Body mass index is 30.59 kg/m.  General Appearance: Well Groomed  Eye Contact::  Good  Speech:  Clear and Coherent and Normal Rate409  Volume:  Normal  Mood:  Euthymic  Affect:  Congruent  Thought Process:  Coherent and Linear  Orientation:  Full (Time, Place, and Person)  Thought Content:  Logical  Suicidal Thoughts:  No  Homicidal Thoughts:  No  Memory:  Immediate;   Fair Recent;   Fair Remote;   Fair  Judgement:  Fair  Insight:  Fair  Psychomotor Activity:  Normal  Concentration:  Good  Recall:  Good  Fund of Knowledge:Good  Language: Good  Akathisia:  Negative  Handed:  Right  AIMS (if indicated):     Assets:  Communication Skills Desire for Improvement Intimacy Resilience Social Support  Sleep:  Number of Hours: 6.5  Cognition: WNL  ADL's:  Intact   Mental Status Per Nursing Assessment::   On Admission:  Self-harm thoughts  Demographic Factors:  Low socioeconomic status  Loss Factors: NA  Historical Factors: NA  Risk Reduction Factors:   Responsible for children under 3 years of age, Sense of responsibility to family, Religious beliefs about death, Living with another  person, especially a relative, Positive social support, Positive therapeutic relationship and Positive coping skills or problem solving skills  Continued Clinical Symptoms:  Depression:   Recent sense of peace/wellbeing Alcohol/Substance Abuse/Dependencies More than one psychiatric diagnosis Previous Psychiatric Diagnoses and Treatments  Cognitive Features That Contribute To Risk:  None    Suicide Risk:  Minimal: No identifiable suicidal ideation.  Patients presenting with no risk factors but with morbid ruminations; may be classified as minimal risk based on the severity of the depressive symptoms   Follow-up Information    [facility] Follow up.   Specialty: Urgent Care Why: No scheudled appointments available, asked that you walk in on 12/17/2019 7:45AM for therapy appointment.  Medication management 01/10/2020 at 9:30AM.  Please bring ID to both appointments.  Thanks! Contact information: [address] [place] [phone]              Plan Of Care/Follow-up recommendations:  Activity:  as tolerated Diet:  low-sodium, heart healthy diet  [phone], MD 12/10/2019, 9:20 AM

## 2019-12-10 NOTE — Progress Notes (Signed)
Recreation Therapy Notes  INPATIENT RECREATION TR PLAN  Patient Details Name: Rachel Nolan MRN: [id number] DOB: 1970-03-21 Today's Date: 12/10/2019  Rec Therapy Plan Is patient appropriate for Therapeutic Recreation?: Yes Treatment times per week: at least 3 Estimated Length of Stay: 5-7 days TR Treatment/Interventions: Group participation (Comment)  Discharge Criteria Pt will be discharged from therapy if:: Discharged Treatment plan/goals/alternatives discussed and agreed upon by:: Patient/family  Discharge Summary Short term goals set: Patient will engage in groups without prompting or encouragement from LRT x3 group sessions within 5 recreation therapy group sessions Short term goals met: Not met Reason goals not met: Patient did not attend any groups Therapeutic equipment acquired: N/A Reason patient discharged from therapy: Discharge from hospital Pt/family agrees with progress & goals achieved: Yes Date patient discharged from therapy: 12/10/19   Roseann Kees 12/10/2019, 1:04 PM

## 2019-12-10 NOTE — Progress Notes (Signed)
Recreation Therapy Notes   Date: 12/10/2019  Time: 9:30 am   Location: Craft room     Behavioral response: N/A   Intervention Topic: Self-esteem   Discussion/Intervention: Patient did not attend group.   Clinical Observations/Feedback:  Patient did not attend group.   Yarethzi Branan LRT/CTRS        Michaeljoseph Revolorio 12/10/2019 12:07 PM

## 2019-12-10 NOTE — Progress Notes (Signed)
  [facility] Adult Case Management Discharge Plan :  Will you be returning to the same living situation after discharge:  No.  Pt reports that she will be staying with her friends mother.  At discharge, do you have transportation home?: Yes,  pt reports that her boyfriend will provide transportation.  Do you have the ability to pay for your medications: No.  Release of information consent forms completed and in the chart;  Patient's signature needed at discharge.  Patient to Follow up at:  Follow-up Information    [facility] Follow up.   Specialty: Urgent Care Why: No scheudled appointments available, asked that you walk in on 12/17/2019 7:45AM for therapy appointment.  Medication management 01/10/2020 at 9:30AM.  Please bring ID to both appointments.  Thanks! Contact information: [address] [place] [address] [phone]              Next level of care provider has access to [name] Link:yes  Safety Planning and Suicide Prevention discussed: Yes,  SPE completed with the patient.   Have you used any form of tobacco in the last 30 days? (Cigarettes, Smokeless Tobacco, Cigars, and/or Pipes): Yes  Has patient been referred to the Quitline?: Patient refused referral  Patient has been referred for addiction treatment: Pt. refused referral  Harden Mo, LCSW 12/10/2019, 9:20 AM

## 2019-12-19 ENCOUNTER — Other Ambulatory Visit (HOSPITAL_COMMUNITY): Payer: Self-pay | Admitting: Psychiatry

## 2019-12-19 ENCOUNTER — Encounter (HOSPITAL_COMMUNITY): Payer: Self-pay | Admitting: Psychiatry

## 2019-12-19 ENCOUNTER — Ambulatory Visit (INDEPENDENT_AMBULATORY_CARE_PROVIDER_SITE_OTHER): Payer: No Payment, Other | Admitting: Psychiatry

## 2019-12-19 ENCOUNTER — Telehealth (HOSPITAL_COMMUNITY): Payer: Self-pay | Admitting: Nurse Practitioner

## 2019-12-19 ENCOUNTER — Other Ambulatory Visit: Payer: Self-pay

## 2019-12-19 VITALS — BP 131/89 | HR 87 | Resp 100 | Ht 66.0 in | Wt 193.0 lb

## 2019-12-19 DIAGNOSIS — F1410 Cocaine abuse, uncomplicated: Secondary | ICD-10-CM

## 2019-12-19 DIAGNOSIS — F4310 Post-traumatic stress disorder, unspecified: Secondary | ICD-10-CM

## 2019-12-19 DIAGNOSIS — F331 Major depressive disorder, recurrent, moderate: Secondary | ICD-10-CM

## 2019-12-19 MED ORDER — FLUOXETINE HCL 40 MG PO CAPS: 40.0000 mg | ORAL_CAPSULE | Freq: Every day | ORAL | 1 refills | Status: DC

## 2019-12-19 MED ORDER — QUETIAPINE FUMARATE 200 MG PO TABS: 200.0000 mg | ORAL_TABLET | Freq: Every day | ORAL | 1 refills | Status: DC

## 2019-12-19 MED ORDER — TRAZODONE HCL 100 MG PO TABS: 100.0000 mg | ORAL_TABLET | Freq: Every evening | ORAL | 1 refills | Status: DC | PRN

## 2019-12-19 MED FILL — QUETIAPINE FUMARATE 200 MG: 200 | 30 days supply | Qty: 30 | Fill #0

## 2019-12-19 MED FILL — TRAZODONE HCL 100 MG TABS: 100 | 30 days supply | Qty: 30 | Fill #0

## 2019-12-19 NOTE — Telephone Encounter (Signed)
Care Management   Writer met with patient to discuss housing/shelter resources.  Writer provided patient with resources to the [facility].  Writer contacted the [facility] and they do have open beds.
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
## 2019-12-20 MED FILL — GABAPENTIN 300 MG CAPSULE: 300 | 10 days supply | Qty: 30 | Fill #0

## 2019-12-20 MED FILL — FOLIC ACID 1 MG TABS: 1 | 30 days supply | Qty: 30 | Fill #0

## 2019-12-20 MED FILL — NAPROXEN 375 MG TABLET: 375 | 10 days supply | Qty: 20 | Fill #0

## 2019-12-20 MED FILL — AMLODIPINE BESYLATE 5 MG TA: 5 | 30 days supply | Qty: 30 | Fill #0

## 2019-12-20 MED FILL — FLUoxetine HCL 40 MG CAPS: 40 | 30 days supply | Qty: 30 | Fill #0

## 2019-12-20 MED FILL — tiZANidine HCL 4 MG TABS: 4 | 10 days supply | Qty: 30 | Fill #0

## 2020-01-10 ENCOUNTER — Encounter (HOSPITAL_COMMUNITY): Payer: No Typology Code available for payment source | Admitting: Psychiatry

## 2020-01-22 ENCOUNTER — Other Ambulatory Visit: Payer: Self-pay

## 2020-01-22 ENCOUNTER — Emergency Department (HOSPITAL_COMMUNITY)
Admission: EM | Admit: 2020-01-22 | Discharge: 2020-01-23 | Disposition: A | Discharge status: Home / Self Care | Payer: Self-pay | Attending: Emergency Medicine | Admitting: Emergency Medicine

## 2020-01-22 ENCOUNTER — Encounter (HOSPITAL_COMMUNITY): Payer: Self-pay | Admitting: Emergency Medicine

## 2020-01-22 DIAGNOSIS — Z20822 Contact with and (suspected) exposure to covid-19: Secondary | ICD-10-CM | POA: Insufficient documentation

## 2020-01-22 DIAGNOSIS — Z79899 Other long term (current) drug therapy: Secondary | ICD-10-CM | POA: Insufficient documentation

## 2020-01-22 DIAGNOSIS — F17210 Nicotine dependence, cigarettes, uncomplicated: Secondary | ICD-10-CM | POA: Insufficient documentation

## 2020-01-22 DIAGNOSIS — F419 Anxiety disorder, unspecified: Secondary | ICD-10-CM | POA: Insufficient documentation

## 2020-01-22 DIAGNOSIS — F1910 Other psychoactive substance abuse, uncomplicated: Secondary | ICD-10-CM | POA: Insufficient documentation

## 2020-01-22 DIAGNOSIS — I10 Essential (primary) hypertension: Secondary | ICD-10-CM | POA: Insufficient documentation

## 2020-01-22 DIAGNOSIS — F32A Depression, unspecified: Secondary | ICD-10-CM

## 2020-01-22 DIAGNOSIS — R45851 Suicidal ideations: Secondary | ICD-10-CM | POA: Insufficient documentation

## 2020-01-22 DIAGNOSIS — F329 Major depressive disorder, single episode, unspecified: Secondary | ICD-10-CM | POA: Insufficient documentation

## 2020-01-22 LAB — COMPREHENSIVE METABOLIC PANEL
ALT: 10 U/L (ref 0–44)
AST: 16 U/L (ref 15–41)
Albumin: 3.8 g/dL (ref 3.5–5.0)
Alkaline Phosphatase: 68 U/L (ref 38–126)
Anion gap: 10 (ref 5–15)
BUN: 11 mg/dL (ref 6–20)
CO2: 23 mmol/L (ref 22–32)
Calcium: 9.6 mg/dL (ref 8.9–10.3)
Chloride: 101 mmol/L (ref 98–111)
Creatinine, Ser: 0.75 mg/dL (ref 0.44–1.00)
GFR, Estimated: >60 mL/min (ref >60)
Glucose, Bld: 84 mg/dL (ref 70–99)
Potassium: 4.3 mmol/L (ref 3.5–5.1)
Sodium: 134 mmol/L — ABNORMAL LOW (ref 135–145)
Total Bilirubin: 0.8 mg/dL (ref 0.3–1.2)
Total Protein: 7.3 g/dL (ref 6.5–8.1)

## 2020-01-22 LAB — CBC
HCT: 39.7 % (ref 36.0–46.0)
Hemoglobin: 13.4 g/dL (ref 12.0–15.0)
MCH: 31.8 pg (ref 26.0–34.0)
MCHC: 33.8 g/dL (ref 30.0–36.0)
MCV: 94.3 fL (ref 80.0–100.0)
Platelets: 286 10*3/uL (ref 150–400)
RBC: 4.21 MIL/uL (ref 3.87–5.11)
RDW: 15.3 % (ref 11.5–15.5)
WBC: 8 10*3/uL (ref 4.0–10.5)
nRBC: 0 % (ref 0.0–0.2)

## 2020-01-22 LAB — RESP PANEL BY RT-PCR (RSV, FLU A&B, COVID)  RVPGX2
Influenza A by PCR: NEGATIVE
Influenza B by PCR: NEGATIVE
Resp Syncytial Virus by PCR: NEGATIVE
SARS Coronavirus 2 by RT PCR: NEGATIVE

## 2020-01-22 LAB — RAPID URINE DRUG SCREEN, HOSP PERFORMED
Amphetamines: NOT DETECTED
Barbiturates: NOT DETECTED
Benzodiazepines: NOT DETECTED
Cocaine: POSITIVE — AB
Opiates: NOT DETECTED
Tetrahydrocannabinol: NOT DETECTED

## 2020-01-22 LAB — ETHANOL: Alcohol, Ethyl (B): <10 mg/dL (ref <10)

## 2020-01-22 LAB — ACETAMINOPHEN LEVEL: Acetaminophen (Tylenol), Serum: <10 ug/mL — ABNORMAL LOW (ref 10–30)

## 2020-01-22 LAB — SALICYLATE LEVEL: Salicylate Lvl: <7 mg/dL — ABNORMAL LOW (ref 7.0–30.0)

## 2020-01-22 MED ORDER — TRAZODONE HCL 50 MG PO TABS: 100.0000 mg | ORAL_TABLET | Freq: Every evening | ORAL | Status: DC | PRN

## 2020-01-22 MED ORDER — AMLODIPINE BESYLATE 5 MG PO TABS
10.0000 mg | ORAL_TABLET | Freq: Every day | ORAL | Status: DC
  Administered 2020-01-23: 10 mg via ORAL
  Filled 2020-01-22: qty 2

## 2020-01-22 MED ORDER — FOLIC ACID 1 MG PO TABS
1.0000 mg | ORAL_TABLET | Freq: Every day | ORAL | Status: DC
  Administered 2020-01-23: 1 mg via ORAL
  Filled 2020-01-22: qty 1

## 2020-01-22 MED ORDER — GABAPENTIN 300 MG PO CAPS
300.0000 mg | ORAL_CAPSULE | Freq: Three times a day (TID) | ORAL | Status: DC
  Administered 2020-01-23 (×2): 300 mg via ORAL
  Filled 2020-01-22 (×2): qty 1

## 2020-01-22 MED ORDER — MELATONIN 3 MG PO TABS
3.0000 mg | ORAL_TABLET | Freq: Every day | ORAL | Status: DC
  Administered 2020-01-23: 3 mg via ORAL
  Filled 2020-01-22 (×2): qty 1

## 2020-01-22 MED ORDER — ALBUTEROL SULFATE HFA 108 (90 BASE) MCG/ACT IN AERS: 2.0000 | INHALATION_SPRAY | Freq: Four times a day (QID) | RESPIRATORY_TRACT | Status: DC | PRN

## 2020-01-22 MED ORDER — FLUOXETINE HCL 20 MG PO CAPS
40.0000 mg | ORAL_CAPSULE | Freq: Every day | ORAL | Status: DC
  Administered 2020-01-23: 40 mg via ORAL
  Filled 2020-01-22: qty 2

## 2020-01-22 MED ORDER — NICOTINE 21 MG/24HR TD PT24
21.0000 mg | MEDICATED_PATCH | Freq: Every day | TRANSDERMAL | Status: DC
  Administered 2020-01-23: 21 mg via TRANSDERMAL
  Filled 2020-01-22: qty 1

## 2020-01-22 MED ORDER — QUETIAPINE FUMARATE 200 MG PO TABS
200.0000 mg | ORAL_TABLET | Freq: Every day | ORAL | Status: DC
  Administered 2020-01-23: 200 mg via ORAL
  Filled 2020-01-22 (×2): qty 1
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
## 2020-01-22 NOTE — BH Assessment (Signed)
Clinician messaged [name], RN via secure chat in Epic, that she's ready to assess the pt if she's placed in a private room.    Redmond Pulling, MS, [facility], [facility] Triage Specialist [phone]
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
## 2020-01-22 NOTE — ED Notes (Signed)
Brought back to [facility]TA (main treatment area), sitter at bedside.

## 2020-01-22 NOTE — ED Notes (Signed)
Belongings in locker 4  

## 2020-01-23 ENCOUNTER — Inpatient Hospital Stay (HOSPITAL_COMMUNITY)
Admission: AD | Admit: 2020-01-23 | Discharge: 2020-01-29 | DRG: 885 | Disposition: A | Discharge status: Home / Self Care | Payer: Federal, State, Local not specified - Other | Source: Intra-hospital | Attending: Psychiatry | Admitting: Psychiatry

## 2020-01-23 ENCOUNTER — Encounter (HOSPITAL_COMMUNITY): Payer: Self-pay | Admitting: Psychiatry

## 2020-01-23 ENCOUNTER — Other Ambulatory Visit: Payer: Self-pay | Admitting: Psychiatric/Mental Health

## 2020-01-23 DIAGNOSIS — G4700 Insomnia, unspecified: Secondary | ICD-10-CM | POA: Diagnosis present

## 2020-01-23 DIAGNOSIS — I10 Essential (primary) hypertension: Secondary | ICD-10-CM | POA: Diagnosis present

## 2020-01-23 DIAGNOSIS — F332 Major depressive disorder, recurrent severe without psychotic features: Secondary | ICD-10-CM | POA: Diagnosis present

## 2020-01-23 DIAGNOSIS — F1510 Other stimulant abuse, uncomplicated: Secondary | ICD-10-CM | POA: Diagnosis present

## 2020-01-23 DIAGNOSIS — F323 Major depressive disorder, single episode, severe with psychotic features: Secondary | ICD-10-CM

## 2020-01-23 DIAGNOSIS — K219 Gastro-esophageal reflux disease without esophagitis: Secondary | ICD-10-CM | POA: Diagnosis present

## 2020-01-23 DIAGNOSIS — F333 Major depressive disorder, recurrent, severe with psychotic symptoms: Principal | ICD-10-CM | POA: Diagnosis present

## 2020-01-23 DIAGNOSIS — F1994 Other psychoactive substance use, unspecified with psychoactive substance-induced mood disorder: Secondary | ICD-10-CM | POA: Diagnosis not present

## 2020-01-23 DIAGNOSIS — Z5900 Homelessness unspecified: Secondary | ICD-10-CM | POA: Diagnosis not present

## 2020-01-23 DIAGNOSIS — F331 Major depressive disorder, recurrent, moderate: Secondary | ICD-10-CM

## 2020-01-23 DIAGNOSIS — F4310 Post-traumatic stress disorder, unspecified: Secondary | ICD-10-CM | POA: Diagnosis present

## 2020-01-23 DIAGNOSIS — Z20822 Contact with and (suspected) exposure to covid-19: Secondary | ICD-10-CM | POA: Diagnosis present

## 2020-01-23 DIAGNOSIS — F17210 Nicotine dependence, cigarettes, uncomplicated: Secondary | ICD-10-CM | POA: Diagnosis present

## 2020-01-23 DIAGNOSIS — K047 Periapical abscess without sinus: Secondary | ICD-10-CM | POA: Diagnosis present

## 2020-01-23 DIAGNOSIS — F1590 Other stimulant use, unspecified, uncomplicated: Secondary | ICD-10-CM

## 2020-01-23 MED ORDER — ALUM & MAG HYDROXIDE-SIMETH 200-200-20 MG/5ML PO SUSP
30.0000 mL | ORAL | Status: DC | PRN
Start: 2020-01-23 — End: 2020-01-29

## 2020-01-23 MED ORDER — ACETAMINOPHEN 325 MG PO TABS
650.0000 mg | ORAL_TABLET | Freq: Four times a day (QID) | ORAL | Status: DC | PRN
  Administered 2020-01-23 – 2020-01-27 (×5): 650 mg via ORAL
  Filled 2020-01-23 (×5): qty 2

## 2020-01-23 MED ORDER — FLUOXETINE HCL 20 MG PO CAPS
40.0000 mg | ORAL_CAPSULE | Freq: Every day | ORAL | Status: DC
  Administered 2020-01-24 – 2020-01-29 (×6): 40 mg via ORAL
  Filled 2020-01-23 (×7): qty 2

## 2020-01-23 MED ORDER — GABAPENTIN 300 MG PO CAPS
300.0000 mg | ORAL_CAPSULE | Freq: Three times a day (TID) | ORAL | Status: DC
  Administered 2020-01-23 – 2020-01-29 (×18): 300 mg via ORAL
  Filled 2020-01-23 (×24): qty 1

## 2020-01-23 MED ORDER — ALBUTEROL SULFATE HFA 108 (90 BASE) MCG/ACT IN AERS
1.0000 | INHALATION_SPRAY | Freq: Four times a day (QID) | RESPIRATORY_TRACT | Status: DC | PRN
  Administered 2020-01-23 – 2020-01-29 (×6): 2 via RESPIRATORY_TRACT
  Filled 2020-01-23 (×2): qty 6.7

## 2020-01-23 MED ORDER — MAGNESIUM HYDROXIDE 400 MG/5ML PO SUSP
30.0000 mL | Freq: Every day | ORAL | Status: DC | PRN
Start: 2020-01-23 — End: 2020-01-29
  Administered 2020-01-25: 30 mL via ORAL
  Filled 2020-01-23: qty 30

## 2020-01-23 MED ORDER — NICOTINE 21 MG/24HR TD PT24
21.0000 mg | MEDICATED_PATCH | Freq: Every day | TRANSDERMAL | Status: DC
  Administered 2020-01-24 – 2020-01-29 (×6): 21 mg via TRANSDERMAL
  Filled 2020-01-23 (×7): qty 1

## 2020-01-23 MED ORDER — HYDROXYZINE HCL 25 MG PO TABS
25.0000 mg | ORAL_TABLET | Freq: Three times a day (TID) | ORAL | Status: DC | PRN
Start: 2020-01-23 — End: 2020-01-29
  Administered 2020-01-24 (×2): 25 mg via ORAL
  Filled 2020-01-23 (×3): qty 1

## 2020-01-23 MED ORDER — FOLIC ACID 1 MG PO TABS
1.0000 mg | ORAL_TABLET | Freq: Every day | ORAL | Status: DC
  Administered 2020-01-24 – 2020-01-29 (×6): 1 mg via ORAL
  Filled 2020-01-23 (×7): qty 1

## 2020-01-23 MED ORDER — MELATONIN 3 MG PO TABS
3.0000 mg | ORAL_TABLET | Freq: Every day | ORAL | Status: DC
  Administered 2020-01-23 – 2020-01-28 (×6): 3 mg via ORAL
  Filled 2020-01-23 (×8): qty 1

## 2020-01-23 MED ORDER — TRAZODONE HCL 100 MG PO TABS
100.0000 mg | ORAL_TABLET | Freq: Every evening | ORAL | Status: DC | PRN
  Administered 2020-01-24 – 2020-01-25 (×2): 100 mg via ORAL
  Filled 2020-01-23 (×2): qty 1

## 2020-01-23 MED ORDER — AMLODIPINE BESYLATE 10 MG PO TABS
10.0000 mg | ORAL_TABLET | Freq: Every day | ORAL | Status: DC
  Administered 2020-01-24 – 2020-01-29 (×7): 10 mg via ORAL
  Filled 2020-01-23 (×7): qty 1

## 2020-01-23 MED ORDER — QUETIAPINE FUMARATE 200 MG PO TABS
200.0000 mg | ORAL_TABLET | Freq: Every day | ORAL | Status: DC
  Administered 2020-01-23 – 2020-01-25 (×3): 200 mg via ORAL
  Filled 2020-01-23 (×6): qty 1

## 2020-01-23 NOTE — Plan of Care (Signed)
Nurse discussed anxiety, depression, coping skills with patient. 

## 2020-01-23 NOTE — Progress Notes (Signed)
   01/23/20 2132  COVID-19 Daily Checkoff  Have you had a fever (temp > 37.80C/100F)  in the past 24 hours?  No  COVID-19 EXPOSURE  Have you traveled outside the state in the past 14 days? No  Have you been in contact with someone with a confirmed diagnosis of COVID-19 or PUI in the past 14 days without wearing appropriate PPE? No  Have you been living in the same home as a person with confirmed diagnosis of COVID-19 or a PUI (household contact)? No  Have you been diagnosed with COVID-19? No

## 2020-01-23 NOTE — BH Assessment (Signed)
Per [name], RN is working on finding the pt a private room to complete his assessment. RN asked clinician to give her 30-40 minutes. Clinician asked RN to call or message when the pt is ready to be assessed.    Redmond Pulling, MS, [facility], [facility] Triage Specialist [phone]

## 2020-01-23 NOTE — ED Notes (Signed)
Pt moved to room 53 temporarily while awaiting TTS assessment.
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
## 2020-01-23 NOTE — ED Notes (Signed)
TTS at bedside. 

## 2020-01-23 NOTE — BH Assessment (Signed)
Pt does not want anyone to know she's in the hospital except Bonnielee Haff, friend, [phone].     Redmond Pulling, MS, [facility], [facility] Triage Specialist [phone]
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
## 2020-01-23 NOTE — Progress Notes (Signed)
Psychoeducational Group Note  Date:  01/23/2020 Time:  2233  Group Topic/Focus:  Wrap-Up Group:   The focus of this group is to help patients review their daily goal of treatment and discuss progress on daily workbooks.  Participation Level: Did Not Attend  Participation Quality:  Not Applicable  Affect:  Not Applicable  Cognitive:  Not Applicable  Insight:  Not Applicable  Engagement in Group: Not Applicable  Additional Comments:  The patient did not attend group this evening.   Hazle Coca S 01/23/2020, 10:33 PM

## 2020-01-23 NOTE — Progress Notes (Signed)
Pt accepted to [facility], bed 302-2       Nira Conn, NP is the accepting provider.    Dr. Jola Babinski is the attending provider.    Call report to [phone]    Maralyn Sago @ [facility] ED notified.     Pt is scheduled to arrive at [facility] at 2pm    Wells Guiles, MSW, LCSW, LCAS Clinical Social Worker II Disposition CSW [phone]

## 2020-01-24 DIAGNOSIS — F333 Major depressive disorder, recurrent, severe with psychotic symptoms: Principal | ICD-10-CM

## 2020-01-24 DIAGNOSIS — F1994 Other psychoactive substance use, unspecified with psychoactive substance-induced mood disorder: Secondary | ICD-10-CM

## 2020-01-24 DIAGNOSIS — F323 Major depressive disorder, single episode, severe with psychotic features: Secondary | ICD-10-CM

## 2020-01-24 DIAGNOSIS — F1590 Other stimulant use, unspecified, uncomplicated: Secondary | ICD-10-CM

## 2020-01-24 DIAGNOSIS — F331 Major depressive disorder, recurrent, moderate: Secondary | ICD-10-CM

## 2020-01-24 DIAGNOSIS — F4310 Post-traumatic stress disorder, unspecified: Secondary | ICD-10-CM

## 2020-01-24 LAB — LIPID PANEL
Cholesterol: 148 mg/dL (ref 0–200)
HDL: 67 mg/dL (ref >40)
LDL Cholesterol: 67 mg/dL (ref 0–99)
Total CHOL/HDL Ratio: 2.2 RATIO
Triglycerides: 72 mg/dL (ref <150)
VLDL: 14 mg/dL (ref 0–40)

## 2020-01-24 LAB — TSH: TSH: 0.783 u[IU]/mL (ref 0.350–4.500)

## 2020-01-24 LAB — HEMOGLOBIN A1C
Hgb A1c MFr Bld: 5.5 % (ref 4.8–5.6)
Mean Plasma Glucose: 111.15 mg/dL

## 2020-01-24 MED ORDER — PRAZOSIN HCL 2 MG PO CAPS
2.0000 mg | ORAL_CAPSULE | Freq: Every day | ORAL | Status: DC
  Administered 2020-01-24 – 2020-01-25 (×2): 2 mg via ORAL
  Filled 2020-01-24: qty 2
  Filled 2020-01-24: qty 1
  Filled 2020-01-24 (×2): qty 2
  Filled 2020-01-24: qty 1
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
## 2020-01-24 NOTE — Progress Notes (Signed)
Psychoeducational Group Note  Date:  01/24/2020 Time:  2239  Group Topic/Focus:  Wrap-Up Group:   The focus of this group is to help patients review their daily goal of treatment and discuss progress on daily workbooks.  Participation Level: Did Not Attend  Participation Quality:  Not Applicable  Affect:  Not Applicable  Cognitive:  Not Applicable  Insight:  Not Applicable  Engagement in Group: Not Applicable  Additional Comments:  The patient did not attend group.   Hazle Coca S 01/24/2020, 10:39 PM
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
## 2020-01-24 NOTE — Plan of Care (Signed)
Nurse discussed anxiety, depression and coping skills with patient.  
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
## 2020-01-24 NOTE — [facility] Group Notes (Signed)
Occupational Therapy Group Note Date: 01/24/2020 Group Topic/Focus: Communication Skills  Group Description: Group encouraged increased engagement and participation through discussion focused on communication styles. Patients were educated on the different styles of communication including passive, aggressive, assertive, and passive-aggressive communication. Group members shared and reflected on which styles they most often find themselves communicating in and brainstormed strategies on how to transition and practice a more assertive approach. Further discussion explored how to use assertiveness skills and strategies to further advocate and ask questions as it relates to their treatment plan and mental health.   Therapeutic Goal(s): Identify practical strategies to improve communication skills  Identify how to use assertive communication skills to address individual needs and wants Participation Level: Patient did not attend OT group session despite personal invitation.    Plan: Continue to engage patient in OT groups 2 - 3x/week.  01/24/2020  Donne Hazel, MOT, OTR/L

## 2020-01-24 NOTE — Progress Notes (Signed)
Patient has stayed in her bed most of the day.  Medications administered per MD orders.  Emotional support and encouragement given patient.  Safety maintained with 15 minute checks.

## 2020-01-25 LAB — TSH: TSH: 1.232 u[IU]/mL (ref 0.350–4.500)

## 2020-01-25 LAB — COMPREHENSIVE METABOLIC PANEL
ALT: 10 U/L (ref 0–44)
AST: 14 U/L — ABNORMAL LOW (ref 15–41)
Albumin: 3.6 g/dL (ref 3.5–5.0)
Alkaline Phosphatase: 69 U/L (ref 38–126)
Anion gap: 8 (ref 5–15)
BUN: 13 mg/dL (ref 6–20)
CO2: 26 mmol/L (ref 22–32)
Calcium: 9 mg/dL (ref 8.9–10.3)
Chloride: 105 mmol/L (ref 98–111)
Creatinine, Ser: 0.72 mg/dL (ref 0.44–1.00)
GFR, Estimated: >60 mL/min (ref >60)
Glucose, Bld: 117 mg/dL — ABNORMAL HIGH (ref 70–99)
Potassium: 4.1 mmol/L (ref 3.5–5.1)
Sodium: 139 mmol/L (ref 135–145)
Total Bilirubin: 0.4 mg/dL (ref 0.3–1.2)
Total Protein: 7 g/dL (ref 6.5–8.1)

## 2020-01-25 MED ORDER — THIAMINE HCL 100 MG PO TABS
100.0000 mg | ORAL_TABLET | Freq: Every day | ORAL | Status: DC
  Administered 2020-01-26 – 2020-01-29 (×4): 100 mg via ORAL
  Filled 2020-01-25 (×5): qty 1

## 2020-01-25 MED ORDER — LORAZEPAM 1 MG PO TABS: 1.0000 mg | ORAL_TABLET | Freq: Four times a day (QID) | ORAL | Status: AC | PRN

## 2020-01-25 MED ORDER — ADULT MULTIVITAMIN W/MINERALS CH
1.0000 | ORAL_TABLET | Freq: Every day | ORAL | Status: DC
  Administered 2020-01-25 – 2020-01-29 (×5): 1 via ORAL
  Filled 2020-01-25 (×6): qty 1

## 2020-01-25 MED ORDER — WHITE PETROLATUM EX OINT
TOPICAL_OINTMENT | CUTANEOUS | Status: AC
  Filled 2020-01-25: qty 5
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
## 2020-01-25 NOTE — Progress Notes (Signed)
D: Patient presents with depressed affect and is minimal upon interaction. Patient denies SI/HI at this time. Patient also denies AH/VH at this time. Patient contracts for safety.  A: Provided positive reinforcement and encouragement.  R: Patient cooperative and receptive to efforts. Patient remains safe on the unit.   01/25/20 2135  Psych Admission Type (Psych Patients Only)  Admission Status Voluntary  Psychosocial Assessment  Patient Complaints Anxiety;Depression  Eye Contact Fair  Facial Expression Anxious;Sad  Affect Anxious;Depressed;Sad  Speech Logical/coherent  Interaction Minimal;Isolative  Motor Activity Slow;Unsteady  Appearance/Hygiene Disheveled  Behavior Characteristics Cooperative;Appropriate to situation  Mood Anxious;Depressed  Thought Process  Coherency WDL  Content Blaming others  Delusions None reported or observed  Perception WDL  Hallucination None reported or observed  Judgment Impaired  Confusion None  Danger to Self  Current suicidal ideation? Denies  Self-Injurious Behavior No self-injurious ideation or behavior indicators observed or expressed   Agreement Not to Harm Self Yes  Description of Agreement Verbal Contract  Danger to Others  Danger to Others None reported or observed  Danger to Others Abnormal  Harmful Behavior to others No threats or harm toward other people  Destructive Behavior No threats or harm toward property

## 2020-01-25 NOTE — Tx Team (Signed)
Interdisciplinary Treatment and Diagnostic Plan Update  01/25/2020 Time of Session: 9:20am Rachel Nolan MRN: [id number]  Principal Diagnosis: MDD (major depressive disorder), recurrent, severe, with psychosis ([place])  Secondary Diagnoses: Principal Problem:   MDD (major depressive disorder), recurrent, severe, with psychosis ([place]) Active Problems:   Post traumatic stress disorder (PTSD)   Stimulant use disorder   Current Medications:  Current Facility-Administered Medications  Medication Dose Route Frequency Provider Last Rate Last Admin  . acetaminophen (TYLENOL) tablet 650 mg  650 mg Oral Q6H PRN Connye Burkitt, NP   650 mg at 01/23/20 2132  . albuterol (VENTOLIN HFA) 108 (90 Base) MCG/ACT inhaler 1-2 puff  1-2 puff Inhalation Q6H PRN Connye Burkitt, NP   2 puff at 01/24/20 1716  . alum & mag hydroxide-simeth (MAALOX/MYLANTA) 200-200-20 MG/5ML suspension 30 mL  30 mL Oral Q4H PRN Connye Burkitt, NP      . amLODipine (NORVASC) tablet 10 mg  10 mg Oral Daily Connye Burkitt, NP   10 mg at 01/25/20 0830  . FLUoxetine (PROZAC) capsule 40 mg  40 mg Oral Daily Connye Burkitt, NP   40 mg at 01/25/20 0829  . folic acid (FOLVITE) tablet 1 mg  1 mg Oral Daily Connye Burkitt, NP   1 mg at 01/25/20 5638  . gabapentin (NEURONTIN) capsule 300 mg  300 mg Oral TID Connye Burkitt, NP   300 mg at 01/25/20 1255  . hydrOXYzine (ATARAX/VISTARIL) tablet 25 mg  25 mg Oral TID PRN Connye Burkitt, NP   25 mg at 01/24/20 2128  . magnesium hydroxide (MILK OF MAGNESIA) suspension 30 mL  30 mL Oral Daily PRN Connye Burkitt, NP   30 mL at 01/25/20 1256  . melatonin tablet 3 mg  3 mg Oral QHS Connye Burkitt, NP   3 mg at 01/24/20 2127  . nicotine (NICODERM CQ - dosed in mg/24 hours) patch 21 mg  21 mg Transdermal Daily Connye Burkitt, NP   21 mg at 01/25/20 0829  . prazosin (MINIPRESS) capsule 2 mg  2 mg Oral QHS Nwoko, Agnes I, NP   2 mg at 01/24/20 2200  . QUEtiapine (SEROQUEL) tablet 200 mg  200 mg Oral QHS Connye Burkitt, NP   200 mg at 01/24/20 2127  . traZODone (DESYREL) tablet 100 mg  100 mg Oral QHS PRN Connye Burkitt, NP   100 mg at 01/24/20 2128   PTA Medications: Medications Prior to Admission  Medication Sig Dispense Refill Last Dose  . amLODipine (NORVASC) 5 MG tablet Take 5 mg by mouth daily.     Marland Kitchen FLUoxetine (PROZAC) 40 MG capsule Take 1 capsule (40 mg total) by mouth daily. 30 capsule 1   . folic acid (FOLVITE) 1 MG tablet Take 1 tablet (1 mg total) by mouth daily. 30 tablet 1   . gabapentin (NEURONTIN) 300 MG capsule Take 1 capsule (300 mg total) by mouth 3 (three) times daily. 30 capsule 1   . melatonin 3 MG TABS tablet Take 1 tablet (3 mg total) by mouth at bedtime. 30 tablet 0   . methocarbamol (ROBAXIN) 500 MG tablet Take 1,000 mg by mouth 4 (four) times daily.     . naproxen (NAPROSYN) 375 MG tablet Take 375 mg by mouth 2 (two) times daily.     . QUEtiapine (SEROQUEL) 200 MG tablet Take 1 tablet (200 mg total) by mouth at bedtime. 30 tablet 1   . tiZANidine (ZANAFLEX)  4 MG tablet Take 4 mg by mouth every 8 (eight) hours as needed for muscle pain.     . traZODone (DESYREL) 100 MG tablet Take 1 tablet (100 mg total) by mouth at bedtime as needed for sleep. 30 tablet 1   . amLODipine (NORVASC) 10 MG tablet Take 1 tablet (10 mg total) by mouth daily. (Patient not taking: No sig reported) 30 tablet 1 Not Taking at Unknown time  . nicotine (NICODERM CQ - DOSED IN MG/24 HOURS) 21 mg/24hr patch Place 1 patch (21 mg total) onto the skin daily. (Patient not taking: Reported on 01/24/2020) 28 patch 0 Not Taking at Unknown time    Patient Stressors:    Patient Strengths:    Treatment Modalities: Medication Management, Group therapy, Case management,  1 to 1 session with clinician, Psychoeducation, Recreational therapy.   Physician Treatment Plan for Primary Diagnosis: MDD (major depressive disorder), recurrent, severe, with psychosis ([place]) Long Term Goal(s): Improvement in symptoms so as  ready for discharge Improvement in symptoms so as ready for discharge   Short Term Goals: Ability to identify changes in lifestyle to reduce recurrence of condition will improve Ability to verbalize feelings will improve Ability to disclose and discuss suicidal ideas Ability to demonstrate self-control will improve Ability to identify and develop effective coping behaviors will improve Compliance with prescribed medications will improve Ability to identify triggers associated with substance abuse/mental health issues will improve  Medication Management: Evaluate patient's response, side effects, and tolerance of medication regimen.  Therapeutic Interventions: 1 to 1 sessions, Unit Group sessions and Medication administration.  Evaluation of Outcomes: Not Met  Physician Treatment Plan for Secondary Diagnosis: Principal Problem:   MDD (major depressive disorder), recurrent, severe, with psychosis ([place]) Active Problems:   Post traumatic stress disorder (PTSD)   Stimulant use disorder  Long Term Goal(s): Improvement in symptoms so as ready for discharge Improvement in symptoms so as ready for discharge   Short Term Goals: Ability to identify changes in lifestyle to reduce recurrence of condition will improve Ability to verbalize feelings will improve Ability to disclose and discuss suicidal ideas Ability to demonstrate self-control will improve Ability to identify and develop effective coping behaviors will improve Compliance with prescribed medications will improve Ability to identify triggers associated with substance abuse/mental health issues will improve     Medication Management: Evaluate patient's response, side effects, and tolerance of medication regimen.  Therapeutic Interventions: 1 to 1 sessions, Unit Group sessions and Medication administration.  Evaluation of Outcomes: Not Met   RN Treatment Plan for Primary Diagnosis: MDD (major depressive disorder), recurrent,  severe, with psychosis ([place]) Long Term Goal(s): Knowledge of disease and therapeutic regimen to maintain health will improve  Short Term Goals: Ability to participate in decision making will improve, Ability to verbalize feelings will improve and Ability to identify and develop effective coping behaviors will improve  Medication Management: RN will administer medications as ordered by provider, will assess and evaluate patient's response and provide education to patient for prescribed medication. RN will report any adverse and/or side effects to prescribing provider.  Therapeutic Interventions: 1 on 1 counseling sessions, Psychoeducation, Medication administration, Evaluate responses to treatment, Monitor vital signs and CBGs as ordered, Perform/monitor CIWA, COWS, AIMS and Fall Risk screenings as ordered, Perform wound care treatments as ordered.  Evaluation of Outcomes: Not Met   LCSW Treatment Plan for Primary Diagnosis: MDD (major depressive disorder), recurrent, severe, with psychosis ([place]) Long Term Goal(s): Safe transition to appropriate next level of care  at discharge, Engage patient in therapeutic group addressing interpersonal concerns.  Short Term Goals: Engage patient in aftercare planning with referrals and resources, Increase social support and Increase ability to appropriately verbalize feelings  Therapeutic Interventions: Assess for all discharge needs, 1 to 1 time with Social worker, Explore available resources and support systems, Assess for adequacy in community support network, Educate family and significant other(s) on suicide prevention, Complete Psychosocial Assessment, Interpersonal group therapy.  Evaluation of Outcomes: Not Met   Progress in Treatment: Attending groups: No. Participating in groups: No. Taking medication as prescribed: Yes. Toleration medication: Yes. Family/Significant other contact made: Yes, individual(s) contacted:  pt's friend Patient  understands diagnosis: No. Discussing patient identified problems/goals with staff: Yes. Medical problems stabilized or resolved: Yes. Denies suicidal/homicidal ideation: Yes. Issues/concerns per patient self-inventory: No. Other: None  New problem(s) identified: No, Describe:  CSW will continue to assess  New Short Term/Long Term Goal(s):medication stabilization, elimination of SI thoughts, development of comprehensive mental wellness plan.  Patient Goals:  "to get on medications"  Discharge Plan or Barriers: Patient recently admitted. CSW will continue to follow and assess for appropriate referrals and possible discharge planning.  Reason for Continuation of Hospitalization: Depression Medication stabilization Withdrawal symptoms  Estimated Length of Stay: 3-5 days  Attendees: Patient: Rachel Nolan 01/25/2020   Physician: Dr. Claris Gower 01/25/2020   Nursing:  01/25/2020   RN Care Manager: 01/25/2020   Social Worker: Toney Reil, Gun[place] 01/25/2020   Recreational Therapist:  01/25/2020   Other:  01/25/2020   Other:  01/25/2020   Other: 01/25/2020    Scribe for Treatment Team: Mliss Fritz, Latanya Presser 01/25/2020 1:13 PM
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
## 2020-01-25 NOTE — Progress Notes (Signed)
Recreation Therapy Notes  Date:  1.7.22 Time: 0930 Location: [address] Dayroom  Group Topic: Stress Management  Goal Area(s) Addresses:  Patient will identify positive stress management techniques. Patient will identify benefits of using stress management post d/c.  Intervention: Stress Management  Activity: Progressive Muscle Relaxation.  LRT read a script to lead group in progressive muscle relaxation.  Patients were to tense each muscle then relax it.  LRT would lead the group through each muscle group individually.      Education:  Stress Management, Discharge Planning.   Education Outcome: Acknowledges Education  Clinical Observations/Feedback: Pt did not attend group activity.     Caroll Rancher, LRT/CTRS         Caroll Rancher A 01/25/2020 11:32 AM

## 2020-01-26 MED ORDER — QUETIAPINE FUMARATE 50 MG PO TABS
250.0000 mg | ORAL_TABLET | Freq: Every day | ORAL | Status: DC
  Administered 2020-01-26 – 2020-01-28 (×3): 250 mg via ORAL
  Filled 2020-01-26 (×4): qty 1

## 2020-01-26 MED ORDER — WHITE PETROLATUM EX OINT
TOPICAL_OINTMENT | CUTANEOUS | Status: AC
  Filled 2020-01-26: qty 5

## 2020-01-26 MED ORDER — PRAZOSIN HCL 2 MG PO CAPS
3.0000 mg | ORAL_CAPSULE | Freq: Every day | ORAL | Status: DC
  Administered 2020-01-26 – 2020-01-28 (×3): 3 mg via ORAL
  Filled 2020-01-26 (×4): qty 1

## 2020-01-26 NOTE — [facility] Group Notes (Signed)
Psychoeducational Group Note    Date:01/26/2020 Time: 1300-1400    Life Skills:  A group where two lists are made. What people need and what are things that we do that are healthy. The lists are developed by the patients and it is explained that we often do the actions that are not healthy to get our list of needs met.   Purpose of Group: . The group focus' on teaching patients on how to identify their needs and how to develop the coping skills needed to get their needs met  Participation Level:  Did not attend   Rachel Nolan

## 2020-01-26 NOTE — Progress Notes (Signed)
   01/26/20 2212  Psych Admission Type (Psych Patients Only)  Admission Status Voluntary  Psychosocial Assessment  Patient Complaints None  Eye Contact Fair  Facial Expression Flat  Affect Appropriate to circumstance  Speech Logical/coherent  Interaction Minimal  Motor Activity Slow (utilizes walker)  Appearance/Hygiene Disheveled  Behavior Characteristics Appropriate to situation  Mood Depressed  Thought Process  Coherency WDL  Content WDL  Delusions None reported or observed  Perception WDL  Hallucination None reported or observed  Judgment Impaired  Confusion None  Danger to Self  Current suicidal ideation? Denies  Self-Injurious Behavior No self-injurious ideation or behavior indicators observed or expressed   Agreement Not to Harm Self Yes  Description of Agreement Verbal Contract  Danger to Others  Danger to Others None reported or observed  Danger to Others Abnormal  Harmful Behavior to others No threats or harm toward other people  Destructive Behavior No threats or harm toward property

## 2020-01-26 NOTE — [facility] Group Notes (Signed)
.  Psychoeducational Group Note  Date: 01-26-20 Time: 0900-1000    Goal Setting   Purpose of Group: This group helps to provide patients with the steps of setting a goal that is specific, measurable, attainable, realistic and time specific. A discussion on how we keep ourselves stuck with negative self talk.    Participation Level:  Active  Participation Quality:  Appropriate  Affect:  Appropriate  Cognitive:  Appropriate  Insight:  Improving  Engagement in Group:  Engaged  Additional Comments:  Pt rates her energy at a 4/10  Rachel Nolan A

## 2020-01-26 NOTE — Progress Notes (Signed)
[place] NOVEL CORONAVIRUS (COVID-19) DAILY CHECK-OFF SYMPTOMS - answer yes or no to each - every day NO YES  Have you had a fever in the past 24 hours?  . Fever (Temp > 37.80C / 100F) X   Have you had any of these symptoms in the past 24 hours? . New Cough .  Sore Throat  .  Shortness of Breath .  Difficulty Breathing .  Unexplained Body Aches   X   Have you had any one of these symptoms in the past 24 hours not related to allergies?   . Runny Nose .  Nasal Congestion .  Sneezing   X   If you have had runny nose, nasal congestion, sneezing in the past 24 hours, has it worsened?  X   EXPOSURES - check yes or no X   Have you traveled outside the state in the past 14 days?  X   Have you been in contact with someone with a confirmed diagnosis of COVID-19 or PUI in the past 14 days without wearing appropriate PPE?  X   Have you been living in the same home as a person with confirmed diagnosis of COVID-19 or a PUI (household contact)?    X   Have you been diagnosed with COVID-19?    X              What to do next: Answered NO to all: Answered YES to anything:   Proceed with unit schedule Follow the [facility] Inpatient Flowsheet.   

## 2020-01-26 NOTE — Progress Notes (Signed)
   01/26/20 2208  COVID-19 Daily Checkoff  Have you had a fever (temp > 37.80C/100F)  in the past 24 hours?  No  If you have had runny nose, nasal congestion, sneezing in the past 24 hours, has it worsened? No  COVID-19 EXPOSURE  Have you traveled outside the state in the past 14 days? No  Have you been in contact with someone with a confirmed diagnosis of COVID-19 or PUI in the past 14 days without wearing appropriate PPE? No  Have you been living in the same home as a person with confirmed diagnosis of COVID-19 or a PUI (household contact)? No  Have you been diagnosed with COVID-19? No

## 2020-01-26 NOTE — Progress Notes (Signed)
   01/26/20 1300  Psych Admission Type (Psych Patients Only)  Admission Status Voluntary  Psychosocial Assessment  Patient Complaints Anxiety;Depression  Eye Contact Fair  Facial Expression Anxious;Sad  Affect Anxious;Depressed;Sad  Speech Logical/coherent  Interaction Minimal;Isolative  Motor Activity Slow;Unsteady  Appearance/Hygiene Disheveled  Behavior Characteristics Cooperative  Mood Depressed  Aggressive Behavior  Effect No apparent injury  Thought Process  Coherency WDL  Content WDL  Delusions None reported or observed  Perception WDL  Hallucination None reported or observed  Judgment Impaired  Confusion None  Danger to Self  Current suicidal ideation? Denies  Self-Injurious Behavior No self-injurious ideation or behavior indicators observed or expressed   Agreement Not to Harm Self Yes  Description of Agreement Verbal Contract  Danger to Others  Danger to Others None reported or observed  Danger to Others Abnormal  Harmful Behavior to others No threats or harm toward other people  Destructive Behavior No threats or harm toward property

## 2020-01-26 NOTE — Psychosocial Assessment (Signed)
LCSW Group Therapy Note  01/26/2020    10:00-11:00am   Type of Therapy and Topic:  Group Therapy: Early Messages Received About Anger  Participation Level:  None   Description of Group:   In this group, patients shared and discussed the early messages received in their lives about anger through parental or other adult modeling, teaching, repression, punishment, violence, and more.  Participants identified how those childhood lessons influence even now how they usually or often react when angered.  The group discussed that anger is a secondary emotion and what may be the underlying emotional themes that come out through anger outbursts or that are ignored through anger suppression.    Therapeutic Goals: 1. Patients will identify one or more childhood message about anger that they received and how it was taught to them. 2. Patients will discuss how these childhood experiences have influenced and continue to influence their own expression or repression of anger even today. 3. Patients will explore possible primary emotions that tend to fuel their secondary emotion of anger. 4. Patients will learn that anger itself is normal and cannot be eliminated, and that healthier coping skills can assist with resolving conflict rather than worsening situations.  Summary of Patient Progress:  The patient listened throughout group but stated she did not want to tell other people her business.  Therapeutic Modalities:   Cognitive Behavioral Therapy Motivation Interviewing  [name]  .

## 2020-01-27 MED ORDER — BENZOCAINE 10 % MT GEL
Freq: Four times a day (QID) | OROMUCOSAL | Status: DC | PRN
  Administered 2020-01-27: 1 via OROMUCOSAL
  Filled 2020-01-27: qty 9

## 2020-01-27 MED ORDER — AMOXICILLIN-POT CLAVULANATE 875-125 MG PO TABS
1.0000 | ORAL_TABLET | Freq: Two times a day (BID) | ORAL | Status: DC
  Administered 2020-01-27 – 2020-01-29 (×5): 1 via ORAL
  Filled 2020-01-27 (×7): qty 1
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
## 2020-01-27 NOTE — [facility] Group Notes (Signed)
[facility] LCSW Group Therapy Note  01/27/2020    Type of Therapy and Topic:  Group Therapy:  Adding Supports Including Yourself  Participation Level:  Did Not Attend   Description of Group:   Patients in this group were introduced to the concept that additional supports including self-support are an essential part of recovery.  Patients listed what supports they believe they need to add to their lives to achieve their goals at discharge, and they listed such things as therapist, family, doctor, support groups, 12-step groups and service animals.   A song entitled "My Own Hero" was played and a group discussion ensued in which patients stated they could relate to the song and it inspired them to realize they have be willing to help themselves in order to succeed, because other people cannot achieve sobriety or stability for them.  Additional songs were played ("Fight For It" then "I Am Enough") to encourage patients toward self-advocacy and self-support as part of their recovery.  They discussed their reactions to these songs' messages, which were positive and hopeful.  Therapeutic Goals: 1)  demonstrate the importance of being a key part of one's own support system 2)  discuss various available supports 3)  encourage patient to use music as part of their self-support and focus on goals 4)  elicit ideas from patients about supports that need to be added   Summary of Patient Progress:  The patient did not attend.  Therapeutic Modalities:   Motivational Interviewing Activity  [name]

## 2020-01-27 NOTE — Plan of Care (Signed)
Patient stayed in her room reporting that she was feeling tired. Pleasant on approach. Alert and oriented x4. Denying suicidal thoughts but admitted that she had the thoughts earlier on previous shift. Expressing worries related to housing issues (currently homeless) and reported "I don't want to return to my sister's because she uses.Marland KitchenMarland KitchenIf these abnormal clinical findings persist, appropriate workup will be completed. The patient understands that follow up is required to elucidate the situation. I go back there I will start using again...". Patient prefers to go to a new environment where there is no substance use "I am tired of drugs...".  Patient is otherwise cooperative and reports no other issues on the unit. Was encouraged to discuss housing issues with social worker in AM. Emotional support provided. Safety precautions reinforced.

## 2020-01-27 NOTE — [facility] Group Notes (Signed)
Adult Psychoeducational Group Not Date:  01/27/2020 Time:  0900-1045 Group Topic/Focus: PROGRESSIVE RELAXATION. A group where deep breathing is taught and tensing and relaxation muscle groups is used. Imagery is used as well.  Pts are asked to imagine 3 pillars that hold them up when they are not able to hold themselves up.  Participation Level:  Active  Participation Quality:  Appropriate  Affect:  Appropriate  Cognitive:  Oriented  Insight: Improving  Engagement in Group:  Engaged  Modes of Intervention:  Activity, Discussion, Education, and Support  Additional Comments:  Pt rates her energy as a 2/10. Left the group due to pain in her tooth.  Dione Housekeeper 01/27/2020

## 2020-01-27 NOTE — Progress Notes (Signed)
[place] NOVEL CORONAVIRUS (COVID-19) DAILY CHECK-OFF SYMPTOMS - answer yes or no to each - every day NO YES  Have you had a fever in the past 24 hours?  . Fever (Temp > 37.80C / 100F) X   Have you had any of these symptoms in the past 24 hours? . New Cough .  Sore Throat  .  Shortness of Breath .  Difficulty Breathing .  Unexplained Body Aches   X   Have you had any one of these symptoms in the past 24 hours not related to allergies?   . Runny Nose .  Nasal Congestion .  Sneezing   X   If you have had runny nose, nasal congestion, sneezing in the past 24 hours, has it worsened?  X   EXPOSURES - check yes or no X   Have you traveled outside the state in the past 14 days?  X   Have you been in contact with someone with a confirmed diagnosis of COVID-19 or PUI in the past 14 days without wearing appropriate PPE?  X   Have you been living in the same home as a person with confirmed diagnosis of COVID-19 or a PUI (household contact)?    X   Have you been diagnosed with COVID-19?    X              What to do next: Answered NO to all: Answered YES to anything:   Proceed with unit schedule Follow the [facility] Inpatient Flowsheet.   

## 2020-01-27 NOTE — Progress Notes (Signed)
   01/27/20 1200  Psych Admission Type (Psych Patients Only)  Admission Status Voluntary  Psychosocial Assessment  Patient Complaints None  Eye Contact Fair  Facial Expression Flat  Affect Appropriate to circumstance  Speech Logical/coherent  Interaction Minimal  Motor Activity Slow (utilizes walker)  Appearance/Hygiene Disheveled  Behavior Characteristics Cooperative;Calm  Mood Pleasant;Depressed  Aggressive Behavior  Effect No apparent injury  Thought Process  Coherency WDL  Content WDL  Delusions None reported or observed  Perception WDL  Hallucination None reported or observed  Judgment Impaired  Confusion None  Danger to Self  Current suicidal ideation? Denies  Self-Injurious Behavior No self-injurious ideation or behavior indicators observed or expressed   Agreement Not to Harm Self Yes  Description of Agreement Verbal Contract  Danger to Others  Danger to Others None reported or observed  Danger to Others Abnormal  Harmful Behavior to others No threats or harm toward other people  Destructive Behavior No threats or harm toward property

## 2020-01-28 LAB — RESP PANEL BY RT-PCR (RSV, FLU A&B, COVID)  RVPGX2
Influenza A by PCR: NEGATIVE
Influenza B by PCR: NEGATIVE
Resp Syncytial Virus by PCR: NEGATIVE
SARS Coronavirus 2 by RT PCR: NEGATIVE

## 2020-01-28 MED ORDER — WHITE PETROLATUM EX OINT
TOPICAL_OINTMENT | CUTANEOUS | Status: AC
  Filled 2020-01-28: qty 5

## 2020-01-28 MED ORDER — TRAZODONE HCL 150 MG PO TABS
150.0000 mg | ORAL_TABLET | Freq: Every evening | ORAL | Status: DC | PRN
  Filled 2020-01-28: qty 7

## 2020-01-28 NOTE — Progress Notes (Signed)
D:  Patient denied SI and HI, contracts for safety.  Denied A/V hallucinations.   A:  Medications administered per MD orders.  Emotional support and encouragement given patient. R:  Safety maintained with 15 minute checks.  

## 2020-01-28 NOTE — Progress Notes (Addendum)
   01/28/20 2057  COVID-19 Daily Checkoff  Have you had a fever (temp > 37.80C/100F)  in the past 24 hours?  No  COVID-19 EXPOSURE  Have you traveled outside the state in the past 14 days? No  Have you been in contact with someone with a confirmed diagnosis of COVID-19 or PUI in the past 14 days without wearing appropriate PPE? No  Have you been living in the same home as a person with confirmed diagnosis of COVID-19 or a PUI (household contact)? No  Have you been diagnosed with COVID-19? No   Pt's vitals were assessed earlier tonight. Her temperature was 100 degrees F at 2029. At the time her temperature was checked she was eating popcorn. Therefore, it was reassessed at 2053 and then it was 98.7 degrees F. Pt denies any Covid symptoms. PA, Melbourne Abts was informed who has ordered for a CBC with diff to be completed in the AM.

## 2020-01-28 NOTE — Plan of Care (Signed)
Nurse discussed anxiety, depression and coping skills with patient.  

## 2020-01-28 NOTE — Progress Notes (Signed)
10:30 Spiritual Care group delayed / not held due to COVID precautions on unit     Spiritual care group on grief and loss  Group Goal:  Support / Education around grief and loss  Members engage in facilitated group support and psycho-social education.  Group Description:  Following introductions and group rules, group members engaged in facilitated group dialog and support around topic of loss, with particular support around experiences of loss in their lives. Group Identified types of loss (relationships / self / things) and identified patterns, circumstances, and changes that precipitate losses. Reflected on thoughts / feelings around loss, normalized grief responses, and recognized variety in grief experience. 
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
## 2020-01-28 NOTE — Progress Notes (Signed)
Recreation Therapy Notes  Date:  1.10.22 Time: 0930 Location: [address]   Group Topic: Stress Management  Goal Area(s) Addresses:  Patient will identify positive stress management techniques. Patient will identify benefits of using stress management post d/c.  Intervention: Worksheets  ActivityHospital doctor.  Due to COVID precautions on unit, patients were given a packet that included worksheets to help with challenging anxious thoughts, setting life goals, positive steps to wellbeing, forgiveness and crossword puzzles/word searches.    Education:  Stress Management, Discharge Planning.   Education Outcome: Acknowledges Education  Clinical Observations/Feedback: Pt was given packet to work on throughout the day.    Caroll Rancher, LRT/CTRS         Caroll Rancher A 01/28/2020 12:20 PM

## 2020-01-29 ENCOUNTER — Other Ambulatory Visit (HOSPITAL_COMMUNITY): Payer: Self-pay | Admitting: Psychiatry

## 2020-01-29 LAB — CBC WITH DIFFERENTIAL/PLATELET
Abs Immature Granulocytes: 0.01 10*3/uL (ref 0.00–0.07)
Basophils Absolute: 0 10*3/uL (ref 0.0–0.1)
Basophils Relative: 0 %
Eosinophils Absolute: 0.1 10*3/uL (ref 0.0–0.5)
Eosinophils Relative: 1 %
HCT: 39.3 % (ref 36.0–46.0)
Hemoglobin: 13.1 g/dL (ref 12.0–15.0)
Immature Granulocytes: 0 %
Lymphocytes Relative: 41 %
Lymphs Abs: 2.4 10*3/uL (ref 0.7–4.0)
MCH: 31.4 pg (ref 26.0–34.0)
MCHC: 33.3 g/dL (ref 30.0–36.0)
MCV: 94.2 fL (ref 80.0–100.0)
Monocytes Absolute: 0.8 10*3/uL (ref 0.1–1.0)
Monocytes Relative: 13 %
Neutro Abs: 2.7 10*3/uL (ref 1.7–7.7)
Neutrophils Relative %: 45 %
Platelets: 257 10*3/uL (ref 150–400)
RBC: 4.17 MIL/uL (ref 3.87–5.11)
RDW: 15.1 % (ref 11.5–15.5)
WBC: 6 10*3/uL (ref 4.0–10.5)
nRBC: 0 % (ref 0.0–0.2)

## 2020-01-29 MED ORDER — PRAZOSIN HCL 1 MG PO CAPS
3.0000 mg | ORAL_CAPSULE | Freq: Every day | ORAL | 0 refills | Status: DC
Start: 2020-01-29 — End: 2020-02-28

## 2020-01-29 MED ORDER — AMLODIPINE BESYLATE 10 MG PO TABS: 10.0000 mg | ORAL_TABLET | Freq: Every day | ORAL | 0 refills | Status: DC

## 2020-01-29 MED ORDER — PRAZOSIN HCL 1 MG PO CAPS
3.0000 mg | ORAL_CAPSULE | Freq: Every day | ORAL | Status: DC
  Filled 2020-01-29: qty 21

## 2020-01-29 MED ORDER — QUETIAPINE FUMARATE 50 MG PO TABS: 250.0000 mg | ORAL_TABLET | Freq: Every day | ORAL | 0 refills | Status: DC

## 2020-01-29 MED ORDER — AMOXICILLIN-POT CLAVULANATE 875-125 MG PO TABS: 1.0000 | ORAL_TABLET | Freq: Two times a day (BID) | ORAL | 0 refills | Status: DC

## 2020-01-29 MED ORDER — FLUOXETINE HCL 40 MG PO CAPS: 40.0000 mg | ORAL_CAPSULE | Freq: Every day | ORAL | 0 refills | Status: DC

## 2020-01-29 MED ORDER — TRAZODONE HCL 150 MG PO TABS: 150.0000 mg | ORAL_TABLET | Freq: Every evening | ORAL | 0 refills | Status: DC | PRN

## 2020-01-29 MED ORDER — QUETIAPINE FUMARATE 100 MG PO TABS
250.0000 mg | ORAL_TABLET | Freq: Every day | ORAL | Status: DC
  Filled 2020-01-29: qty 18

## 2020-01-29 MED ORDER — GABAPENTIN 300 MG PO CAPS: 300.0000 mg | ORAL_CAPSULE | Freq: Three times a day (TID) | ORAL | 0 refills | Status: DC

## 2020-01-29 MED FILL — TRAZODONE HCL 150 MG TABS: 150 | 30 days supply | Qty: 30 | Fill #0

## 2020-01-29 MED FILL — PRAZOSIN 1 MG CAPSULE: 1 | 10 days supply | Qty: 30 | Fill #0

## 2020-01-29 MED FILL — FLUoxetine HCL 40 MG CAPS: 40 | 30 days supply | Qty: 30 | Fill #0

## 2020-01-29 MED FILL — GABAPENTIN 300 MG CAPSULE: 300 | 30 days supply | Qty: 90 | Fill #0

## 2020-01-29 MED FILL — AMOX-CLAV 875-125 MG TABLET: 875-125 | 6 days supply | Qty: 12 | Fill #0

## 2020-01-29 MED FILL — QUETIAPINE FUMARATE 50 MG T: 50 | 6 days supply | Qty: 30 | Fill #0

## 2020-01-29 MED FILL — AMLODIPINE BESYLATE 10 MG T: 10 | 30 days supply | Qty: 30 | Fill #0

## 2020-01-29 NOTE — Progress Notes (Signed)
D:  Patient denied SI and HI, contracts for safety.  Denied A/V hallucinations.  Denied pain. A:  Medications administered per MD orders.  Emotional support and encouragement given patient. R:  Safety maintained with 15 minute checks.  

## 2020-01-29 NOTE — Plan of Care (Signed)
Nurse discussed anxiety, depression and coping skills with patient.  

## 2020-01-29 NOTE — Progress Notes (Signed)
Discharge Note:  Patient denies SI/HI AVH at this time. Discharge instructions, AVS, prescriptions and transition record gone over with patient. Patient agrees to comply with medication management, follow-up visit, and outpatient therapy. Patient belongings returned to patient. Patient questions and concerns addressed and answered.  Patient ambulatory off unit.  Patient discharged to home with friend.   

## 2020-01-29 NOTE — Progress Notes (Signed)
Pt shares that she was triggered by something she saw on TV earlier, so she left the dayroom. She plans to stay away from any family/friends that are drug users upon discharge. She is looking into calling the [facility] for placement since housing is one of her concerns. Ultimately she would like housing in [place] since her mother is currently staying there and she is sick. She denies any withdrawal symptoms. She reports sleeping well at night. She denies that she has any nightmares/flashbacks at night. Pt denies SI/HI and AVH. Active listening, reassurance, and support provided. Medications administered as ordered by MD. Q 15 min safety checks continue. Pt's safety has been maintained.   01/28/20 2057  Psych Admission Type (Psych Patients Only)  Admission Status Voluntary  Psychosocial Assessment  Patient Complaints Anxiety;Worrying  Eye Contact Fair  Facial Expression Anxious  Affect Appropriate to circumstance  Speech Logical/coherent  Interaction Assertive  Motor Activity Slow;Unsteady  Appearance/Hygiene Disheveled  Behavior Characteristics Cooperative;Anxious;Fidgety  Mood Anxious;Depressed  Thought Process  Coherency WDL  Content WDL  Delusions None reported or observed  Perception WDL  Hallucination None reported or observed  Judgment Poor  Confusion None  Danger to Self  Current suicidal ideation? Denies  Self-Injurious Behavior No self-injurious ideation or behavior indicators observed or expressed   Agreement Not to Harm Self Yes  Description of Agreement verbally agrees to notify staff immediately for any thoughts of hurting herself or anyone else  Danger to Others  Danger to Others None reported or observed  Danger to Others Abnormal  Harmful Behavior to others No threats or harm toward other people  Destructive Behavior No threats or harm toward property

## 2020-01-29 NOTE — Progress Notes (Signed)
  [facility] Adult Case Management Discharge Plan :  Will you be returning to the same living situation after discharge:  Yes,  Home (with sister)  At discharge, do you have transportation home?: Yes,  Friend Do you have the ability to pay for your medications: No.  Release of information consent forms completed and in the chart;  Patient's signature needed at discharge.  Patient to Follow up at:  Follow-up Information    [facility]. Go on 02/04/2020.   Specialty: Behavioral Health Why: You have an appointment for therapy services on 02/04/20 at 3:00 pm.  You also have an appointment on 02/28/20 at 1:40 pm for medication management.  These appointments will be held in person. Contact information: [address] [place] [address] [phone]              Next level of care provider has access to [facility] Link:yes  Safety Planning and Suicide Prevention discussed: Yes,  Friend and patient  Have you used any form of tobacco in the last 30 days? (Cigarettes, Smokeless Tobacco, Cigars, and/or Pipes): Yes  Has patient been referred to the Quitline?: Patient refused referral  Patient has been referred for addiction treatment: Pt. refused referral  Aram Beecham, LCSWA 01/29/2020, 9:52 AM
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
## 2020-01-30 ENCOUNTER — Ambulatory Visit (HOSPITAL_COMMUNITY): Payer: No Typology Code available for payment source | Admitting: Psychiatry

## 2020-02-04 ENCOUNTER — Ambulatory Visit (HOSPITAL_COMMUNITY): Payer: No Typology Code available for payment source | Admitting: Licensed Clinical Social Worker

## 2020-02-04 ENCOUNTER — Ambulatory Visit: Payer: Self-pay

## 2020-02-11 ENCOUNTER — Ambulatory Visit: Payer: Self-pay | Attending: Nurse Practitioner

## 2020-02-11 ENCOUNTER — Other Ambulatory Visit: Payer: Self-pay

## 2020-02-28 ENCOUNTER — Ambulatory Visit (INDEPENDENT_AMBULATORY_CARE_PROVIDER_SITE_OTHER): Payer: No Payment, Other | Admitting: Psychiatry

## 2020-02-28 ENCOUNTER — Encounter (HOSPITAL_COMMUNITY): Payer: Self-pay | Admitting: Psychiatry

## 2020-02-28 ENCOUNTER — Other Ambulatory Visit: Payer: Self-pay

## 2020-02-28 ENCOUNTER — Other Ambulatory Visit (HOSPITAL_COMMUNITY): Payer: Self-pay | Admitting: Psychiatry

## 2020-02-28 DIAGNOSIS — F4310 Post-traumatic stress disorder, unspecified: Secondary | ICD-10-CM | POA: Diagnosis not present

## 2020-02-28 DIAGNOSIS — F1410 Cocaine abuse, uncomplicated: Secondary | ICD-10-CM | POA: Diagnosis not present

## 2020-02-28 DIAGNOSIS — F333 Major depressive disorder, recurrent, severe with psychotic symptoms: Secondary | ICD-10-CM | POA: Diagnosis not present

## 2020-02-28 DIAGNOSIS — F331 Major depressive disorder, recurrent, moderate: Secondary | ICD-10-CM

## 2020-02-28 MED ORDER — GABAPENTIN 300 MG PO CAPS
300.0000 mg | ORAL_CAPSULE | Freq: Three times a day (TID) | ORAL | 1 refills | Status: DC
Start: 2020-02-28 — End: 2020-04-11

## 2020-02-28 MED ORDER — QUETIAPINE FUMARATE 300 MG PO TABS: 300.0000 mg | ORAL_TABLET | Freq: Every day | ORAL | 1 refills | Status: DC

## 2020-02-28 MED ORDER — TRAZODONE HCL 150 MG PO TABS: 150.0000 mg | ORAL_TABLET | Freq: Every evening | ORAL | 1 refills | Status: DC | PRN

## 2020-02-28 MED ORDER — PRAZOSIN HCL 1 MG PO CAPS: 3.0000 mg | ORAL_CAPSULE | Freq: Every day | ORAL | 1 refills | Status: DC

## 2020-02-28 MED ORDER — AMLODIPINE BESYLATE 10 MG PO TABS: 10.0000 mg | ORAL_TABLET | Freq: Every day | ORAL | 1 refills | Status: DC

## 2020-02-28 MED ORDER — FLUOXETINE HCL 40 MG PO CAPS: 40.0000 mg | ORAL_CAPSULE | Freq: Every day | ORAL | 1 refills | Status: DC

## 2020-02-28 MED FILL — TRAZODONE HCL 150 MG TABS: 150 | 30 days supply | Qty: 30 | Fill #0

## 2020-02-28 MED FILL — AMLODIPINE BESYLATE 10 MG T: 10 | 30 days supply | Qty: 30 | Fill #0

## 2020-02-28 MED FILL — QUETIAPINE FUMARATE 300 MG: 300 | 30 days supply | Qty: 30 | Fill #0

## 2020-02-28 MED FILL — FLUoxetine HCL 40 MG CAPS: 40 | 30 days supply | Qty: 30 | Fill #0

## 2020-02-28 MED FILL — PRAZOSIN 1 MG CAPSULE: 1 | 10 days supply | Qty: 30 | Fill #0

## 2020-02-28 MED FILL — GABAPENTIN 300 MG CAPSULE: 300 | 30 days supply | Qty: 90 | Fill #0
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
## 2020-03-19 ENCOUNTER — Ambulatory Visit: Payer: Self-pay | Admitting: Nurse Practitioner

## 2020-03-24 ENCOUNTER — Emergency Department
Admission: EM | Admit: 2020-03-24 | Discharge: 2020-03-25 | Payer: Self-pay | Attending: Emergency Medicine | Admitting: Emergency Medicine

## 2020-03-24 ENCOUNTER — Other Ambulatory Visit: Payer: Self-pay

## 2020-03-24 ENCOUNTER — Encounter: Payer: Self-pay | Admitting: Emergency Medicine

## 2020-03-24 DIAGNOSIS — F17210 Nicotine dependence, cigarettes, uncomplicated: Secondary | ICD-10-CM | POA: Insufficient documentation

## 2020-03-24 DIAGNOSIS — F4310 Post-traumatic stress disorder, unspecified: Secondary | ICD-10-CM | POA: Diagnosis present

## 2020-03-24 DIAGNOSIS — R45851 Suicidal ideations: Secondary | ICD-10-CM

## 2020-03-24 DIAGNOSIS — F323 Major depressive disorder, single episode, severe with psychotic features: Secondary | ICD-10-CM | POA: Diagnosis present

## 2020-03-24 DIAGNOSIS — Z20822 Contact with and (suspected) exposure to covid-19: Secondary | ICD-10-CM | POA: Insufficient documentation

## 2020-03-24 DIAGNOSIS — J45909 Unspecified asthma, uncomplicated: Secondary | ICD-10-CM | POA: Insufficient documentation

## 2020-03-24 DIAGNOSIS — F5102 Adjustment insomnia: Secondary | ICD-10-CM

## 2020-03-24 DIAGNOSIS — F1410 Cocaine abuse, uncomplicated: Secondary | ICD-10-CM | POA: Diagnosis present

## 2020-03-24 DIAGNOSIS — Z79899 Other long term (current) drug therapy: Secondary | ICD-10-CM | POA: Insufficient documentation

## 2020-03-24 DIAGNOSIS — F331 Major depressive disorder, recurrent, moderate: Secondary | ICD-10-CM | POA: Diagnosis present

## 2020-03-24 DIAGNOSIS — R440 Auditory hallucinations: Secondary | ICD-10-CM

## 2020-03-24 DIAGNOSIS — F333 Major depressive disorder, recurrent, severe with psychotic symptoms: Secondary | ICD-10-CM | POA: Diagnosis not present

## 2020-03-24 DIAGNOSIS — I10 Essential (primary) hypertension: Secondary | ICD-10-CM | POA: Insufficient documentation

## 2020-03-24 LAB — RESP PANEL BY RT-PCR (FLU A&B, COVID) ARPGX2
Influenza A by PCR: NEGATIVE
Influenza B by PCR: NEGATIVE
SARS Coronavirus 2 by RT PCR: NEGATIVE

## 2020-03-24 LAB — COMPREHENSIVE METABOLIC PANEL
ALT: 10 U/L (ref 0–44)
AST: 15 U/L (ref 15–41)
Albumin: 3.7 g/dL (ref 3.5–5.0)
Alkaline Phosphatase: 64 U/L (ref 38–126)
Anion gap: 5 (ref 5–15)
BUN: 17 mg/dL (ref 6–20)
CO2: 25 mmol/L (ref 22–32)
Calcium: 9 mg/dL (ref 8.9–10.3)
Chloride: 111 mmol/L (ref 98–111)
Creatinine, Ser: 0.86 mg/dL (ref 0.44–1.00)
GFR, Estimated: >60 mL/min (ref >60)
Glucose, Bld: 145 mg/dL — ABNORMAL HIGH (ref 70–99)
Potassium: 3.9 mmol/L (ref 3.5–5.1)
Sodium: 141 mmol/L (ref 135–145)
Total Bilirubin: 0.3 mg/dL (ref 0.3–1.2)
Total Protein: 7.3 g/dL (ref 6.5–8.1)

## 2020-03-24 LAB — CBC
HCT: 39.4 % (ref 36.0–46.0)
Hemoglobin: 13.2 g/dL (ref 12.0–15.0)
MCH: 31.5 pg (ref 26.0–34.0)
MCHC: 33.5 g/dL (ref 30.0–36.0)
MCV: 94 fL (ref 80.0–100.0)
Platelets: 246 10*3/uL (ref 150–400)
RBC: 4.19 MIL/uL (ref 3.87–5.11)
RDW: 14.9 % (ref 11.5–15.5)
WBC: 4.5 10*3/uL (ref 4.0–10.5)
nRBC: 0 % (ref 0.0–0.2)

## 2020-03-24 LAB — SALICYLATE LEVEL: Salicylate Lvl: <7 mg/dL — ABNORMAL LOW (ref 7.0–30.0)

## 2020-03-24 LAB — ETHANOL: Alcohol, Ethyl (B): <10 mg/dL (ref <10)

## 2020-03-24 LAB — ACETAMINOPHEN LEVEL: Acetaminophen (Tylenol), Serum: <10 ug/mL — ABNORMAL LOW (ref 10–30)

## 2020-03-24 MED ORDER — FLUOXETINE HCL 20 MG PO CAPS
40.0000 mg | ORAL_CAPSULE | Freq: Every day | ORAL | Status: DC
  Administered 2020-03-24 – 2020-03-25 (×2): 40 mg via ORAL
  Filled 2020-03-24 (×2): qty 2

## 2020-03-24 MED ORDER — QUETIAPINE FUMARATE 25 MG PO TABS
300.0000 mg | ORAL_TABLET | Freq: Every day | ORAL | Status: DC
  Administered 2020-03-24: 300 mg via ORAL
  Filled 2020-03-24: qty 1

## 2020-03-24 MED ORDER — AMLODIPINE BESYLATE 5 MG PO TABS
10.0000 mg | ORAL_TABLET | Freq: Every day | ORAL | Status: DC
Start: 2020-03-24 — End: 2020-03-25
  Administered 2020-03-24 – 2020-03-25 (×2): 10 mg via ORAL
  Filled 2020-03-24 (×2): qty 2

## 2020-03-24 MED ORDER — GABAPENTIN 300 MG PO CAPS
300.0000 mg | ORAL_CAPSULE | Freq: Three times a day (TID) | ORAL | Status: DC
  Administered 2020-03-24 – 2020-03-25 (×2): 300 mg via ORAL
  Filled 2020-03-24 (×2): qty 1

## 2020-03-24 MED ORDER — TRAZODONE HCL 50 MG PO TABS
150.0000 mg | ORAL_TABLET | Freq: Every day | ORAL | Status: DC
  Administered 2020-03-24: 150 mg via ORAL
  Filled 2020-03-24: qty 1

## 2020-03-24 MED ORDER — PRAZOSIN HCL 1 MG PO CAPS
1.0000 mg | ORAL_CAPSULE | Freq: Every day | ORAL | Status: DC
  Filled 2020-03-24 (×2): qty 1

## 2020-03-24 NOTE — ED Notes (Signed)
Red jacket Brown belt Black bra Blue jeans Camo crocks Gray socks Black head wrap

## 2020-03-24 NOTE — ED Notes (Signed)
Pt. Alert and oriented, warm and dry, in no distress. Pt. Denies SI, HI, and AVH. Pt. Encouraged to let nursing staff know of any concerns or needs. 

## 2020-03-24 NOTE — ED Notes (Signed)
VOL, pend placement 

## 2020-03-24 NOTE — ED Notes (Signed)
Report given to Amy T RN in [place]. Patient to be transferred to [facility].
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
## 2020-03-24 NOTE — ED Notes (Signed)
Knife removed and locked up in security safe in lobby.

## 2020-03-24 NOTE — Consult Note (Signed)
[facility] ([place] Face-to-Face Psychiatry Consult   Reason for Consult: Consult for 50 year old woman with a history of recurrent mental health problems most likely depression with psychosis Referring Physician:Brad[place] Patient Identification: Rachel Nolan MRN:  [id number] Principal Diagnosis: MDD (major depressive disorder), recurrent, severe, with psychosis ([facility]) Diagnosis:  Principal Problem:   MDD (major depressive disorder), recurrent, severe, with psychosis ([facility]) Active Problems:   Post traumatic stress disorder (PTSD)   Cocaine use disorder ([facility])   Total Time spent with patient: 1 hour  Subjective:   Rachel Nolan is a 50 y.o. female patient admitted with "I need some counseling and I need my medicine"  HPI:   Patient seen chart reviewed.  Patient comes voluntarily to the emergency room stating she has been off her medicine for many months now.  Mood is been getting worse and worse.  Nerves are bad all the time.  Sleep is impaired.  Feeling depressed.  Feeling hopeless.  Passive suicidal thoughts.  Having auditory hallucinations at times as well.  Patient admits to me to having used some cocaine last night claims that this is not a regular thing.  She presents as withdrawn sick looking but basically cooperative.  Past Psychiatric History: Patient has a past history of recurrent major depressive episodes with medication management as well as a history of alcohol and drug abuse  Risk to Self:   Risk to Others:   Prior Inpatient Therapy:   Prior Outpatient Therapy:    Past Medical History:  Past Medical History:  Diagnosis Date  . Anxiety   . Asthma   . Depression   . Hypertension   . Scoliosis     Past Surgical History:  Procedure Laterality Date  . BACK SURGERY    . ECTOPIC PREGNANCY SURGERY     Family History:  Family History  Problem Relation Age of Onset  . Hypertension Mother   . Hypertension Father    Family Psychiatric  History: See previous Social History:  Social  History   Substance and Sexual Activity  Alcohol Use Yes   Comment: drinks every other day     Social History   Substance and Sexual Activity  Drug Use Yes  . Types: Marijuana, Cocaine, "Crack" cocaine   Comment: Currently using crack    Social History   Socioeconomic History  . Marital status: Widowed    Spouse name: Not on file  . Number of children: Not on file  . Years of education: Not on file  . Highest education level: Not on file  Occupational History  . Not on file  Tobacco Use  . Smoking status: Current Every Day Smoker    Packs/day: 0.50    Types: Cigarettes  . Smokeless tobacco: Never Used  Vaping Use  . Vaping Use: Never used  Substance and Sexual Activity  . Alcohol use: Yes    Comment: drinks every other day  . Drug use: Yes    Types: Marijuana, Cocaine, "Crack" cocaine    Comment: Currently using crack  . Sexual activity: Not Currently  Other Topics Concern  . Not on file  Social History Narrative   Pt is homeless, no fixed address; not followed by an outpatient psychiatrist   Social Determinants of Health   Financial Resource Strain: Not on file  Food Insecurity: Not on file  Transportation Needs: Not on file  Physical Activity: Not on file  Stress: Not on file  Social Connections: Not on file   Additional Social History:    Allergies:  No Known Allergies  Labs:  Results for orders placed or performed during the hospital encounter of 03/24/20 (from the past 48 hour(s))  Comprehensive metabolic panel     Status: Abnormal   Collection Time: 03/24/20  5:27 PM  Result Value Ref Range   Sodium 141 135 - 145 mmol/L   Potassium 3.9 3.5 - 5.1 mmol/L   Chloride 111 98 - 111 mmol/L   CO2 25 22 - 32 mmol/L   Glucose, Bld 145 (H) 70 - 99 mg/dL    Comment: Glucose reference range applies only to samples taken after fasting for at least 8 hours.   BUN 17 6 - 20 mg/dL   Creatinine, Ser 1.610.86 0.44 - 1.00 mg/dL   Calcium 9.0 8.9 - 09.610.3 mg/dL    Total Protein 7.3 6.5 - 8.1 g/dL   Albumin 3.7 3.5 - 5.0 g/dL   AST 15 15 - 41 U/L   ALT 10 0 - 44 U/L   Alkaline Phosphatase 64 38 - 126 U/L   Total Bilirubin 0.3 0.3 - 1.2 mg/dL   GFR, Estimated >04>60 >54>60 mL/min    Comment: (NOTE) Calculated using the CKD-EPI Creatinine Equation (2021)    Anion gap 5 5 - 15    Comment: Performed at [facility], [address] [place], [place] [address]  Ethanol     Status: None   Collection Time: 03/24/20  5:27 PM  Result Value Ref Range   Alcohol, Ethyl (B) <10 <10 mg/dL    Comment: (NOTE) Lowest detectable limit for serum alcohol is 10 mg/dL.  For medical purposes only. Performed at [facility], [address] [place], [place] [address]   Salicylate level     Status: Abnormal   Collection Time: 03/24/20  5:27 PM  Result Value Ref Range   Salicylate Lvl <7.0 (L) 7.0 - 30.0 mg/dL    Comment: Performed at [facility], [address] [place], [place] [address]  Acetaminophen level     Status: Abnormal   Collection Time: 03/24/20  5:27 PM  Result Value Ref Range   Acetaminophen (Tylenol), Serum <10 (L) 10 - 30 ug/mL    Comment: (NOTE) Therapeutic concentrations vary significantly. A range of 10-30 ug/mL  may be an effective concentration for many patients. However, some  are best treated at concentrations outside of this range. Acetaminophen concentrations >150 ug/mL at 4 hours after ingestion  and >50 ug/mL at 12 hours after ingestion are often associated with  toxic reactions.  Performed at [facility], [address] [place], [place] [address]   cbc     Status: None   Collection Time: 03/24/20  5:27 PM  Result Value Ref Range   WBC 4.5 4.0 - 10.5 K/uL   RBC 4.19 3.87 - 5.11 MIL/uL   Hemoglobin 13.2 12.0 - 15.0 g/dL   HCT 57.839.4 46.936.0 - 62.946.0 %   MCV 94.0 80.0 - 100.0 fL   MCH 31.5 26.0 - 34.0 pg   MCHC 33.5 30.0 - 36.0 g/dL   RDW 52.814.9 41.311.5 - 24.415.5 %   Platelets 246 150 - 400 K/uL   nRBC  0.0 0.0 - 0.2 %    Comment: Performed at [facility], [address] [place], [place] [address]    Current Facility-Administered Medications  Medication Dose Route Frequency Provider Last Rate Last Admin  . amLODipine (NORVASC) tablet 10 mg  10 mg Oral Daily Reann Dobias, Jackquline DenmarkJohn T, MD      .  FLUoxetine (PROZAC) capsule 40 mg  40 mg Oral Daily Rodneshia Greenhouse T, MD      . gabapentin (NEURONTIN) capsule 300 mg  300 mg Oral TID Pranathi Winfree T, MD      . prazosin (MINIPRESS) capsule 1 mg  1 mg Oral QHS Cina Klumpp T, MD      . QUEtiapine (SEROQUEL) tablet 300 mg  300 mg Oral QHS Coti Burd T, MD      . traZODone (DESYREL) tablet 150 mg  150 mg Oral QHS Jaevin Medearis, Jackquline Denmark, MD       Current Outpatient Medications  Medication Sig Dispense Refill  . amLODipine (NORVASC) 10 MG tablet Take 1 tablet (10 mg total) by mouth daily. 30 tablet 1  . FLUoxetine (PROZAC) 40 MG capsule Take 1 capsule (40 mg total) by mouth daily. 30 capsule 1  . gabapentin (NEURONTIN) 300 MG capsule Take 1 capsule (300 mg total) by mouth 3 (three) times daily. 90 capsule 1  . prazosin (MINIPRESS) 1 MG capsule Take 3 capsules (3 mg total) by mouth at bedtime. 30 capsule 1  . QUEtiapine (SEROQUEL) 300 MG tablet Take 1 tablet (300 mg total) by mouth at bedtime. 30 tablet 1  . traZODone (DESYREL) 150 MG tablet Take 1 tablet (150 mg total) by mouth at bedtime as needed for sleep. 30 tablet 1    Musculoskeletal: Strength & Muscle Tone: within normal limits Gait & Station: normal Patient leans: N/A  Psychiatric Specialty Exam: Physical Exam Vitals and nursing note reviewed.  Constitutional:      Appearance: She is well-developed and well-nourished.  HENT:     Head: Normocephalic and atraumatic.  Eyes:     Conjunctiva/sclera: Conjunctivae normal.     Pupils: Pupils are equal, round, and reactive to light.  Cardiovascular:     Heart sounds: Normal heart sounds.  Pulmonary:     Effort: Pulmonary effort is normal.   Abdominal:     Palpations: Abdomen is soft.  Musculoskeletal:        General: Normal range of motion.     Cervical back: Normal range of motion.  Skin:    General: Skin is warm and dry.  Neurological:     General: No focal deficit present.     Mental Status: She is alert.  Psychiatric:        Attention and Perception: She is inattentive.        Mood and Affect: Mood is anxious and depressed.        Speech: Speech is delayed and tangential.        Behavior: Behavior is slowed.        Thought Content: Thought content includes suicidal ideation. Thought content does not include suicidal plan.        Cognition and Memory: Cognition is impaired.        Judgment: Judgment is impulsive.     Review of Systems  Constitutional: Negative.   HENT: Negative.   Eyes: Negative.   Respiratory: Negative.   Cardiovascular: Negative.   Gastrointestinal: Negative.   Musculoskeletal: Negative.   Skin: Negative.   Neurological: Negative.   Psychiatric/Behavioral: Positive for dysphoric mood, hallucinations, sleep disturbance and suicidal ideas.    Blood pressure 112/86, pulse 96, temperature 98.1 F (36.7 C), temperature source Oral, resp. rate 16, height 5\' 6"  (1.676 m), weight 82 kg, last menstrual period 06/19/2018, SpO2 97 %.Body mass index is 29.18 kg/m.  General Appearance: Casual  Eye Contact:  Minimal  Speech:  Slow  Volume:  Decreased  Mood:  Dysphoric  Affect:  Constricted  Thought Process:  Goal Directed  Orientation:  Full (Time, Place, and Person)  Thought Content:  Tangential  Suicidal Thoughts:  No  Homicidal Thoughts:  No  Memory:  Immediate;   Fair Recent;   Poor Remote;   Poor  Judgement:  Fair  Insight:  Fair  Psychomotor Activity:  Decreased  Concentration:  Concentration: Fair  Recall:  [employer] of Knowledge:  Fair  Language:  Fair  Akathisia:  No  Handed:  Right  AIMS (if indicated):     Assets:  Desire for Improvement Housing Resilience  ADL's:   Impaired  Cognition:  Impaired,  Mild  Sleep:        Treatment Plan Summary: Plan Restart medication including Seroquel Prozac prazosin gabapentin amlodipine.  Labs will be reviewed.  Reexamination after starting medication.  No beds available at this time.  If she continues to have psychosis or suicidal ideation and beds are available tomorrow we can reassess for admission.  Disposition: Recommend psychiatric Inpatient admission when medically cleared. Supportive therapy provided about ongoing stressors.  Mordecai Rasmussen, MD 03/24/2020 6:24 PM
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
## 2020-03-24 NOTE — BH Assessment (Signed)
Comprehensive Clinical Assessment (CCA) Note  03/24/2020 Rachel Nolan [id number]  Chief Complaint: Patient is a 50 year old female presenting to [facility] ED voluntarily. Per triage note Patient states she is off her meds.  States she is hearing voices and the kids are getting on my nerves today.  Patient's friend brought patient to ED.  Patient says "I am tired.  Its too much noise for me".  Patient states she ran out of medication 2 weeks ago.  States "the medicine doesn't help when I don't have it".Denies HI.  States her daughters leave their children with her and the noise is too much.  Patient states "I don't want to live this way anymore.  Patient has a pocket knife in triage and says she plans to stab self with knife."   Knife removed from patient's possession in Triage. Patient appears alert and oriented x4, calm and cooperative. Per Psyc MD Dr. Toni Amend she has been off her medicine for many months now.  Mood is been getting worse and worse.  Nerves are bad all the time.  Sleep is impaired.  Feeling depressed.  Feeling hopeless.  Passive suicidal thoughts.  Having auditory hallucinations at times as well. Per patient's chart review patient has a history of depression and SI and was last admitted to [facility] for her depression in 01/2020. Patient also has a admission with [facility] back in 2021 for SI.   Per Psyc MD Dr. Toni Amend patient is recommended for Inpatient Hospitalization  Chief Complaint  Patient presents with  . Mental Health Problem   Visit Diagnosis: Major Depressive Disorder, recurrent episode, severe with psychosis   CCA Screening, Triage and Referral (STR)  Patient Reported Information How did you hear about [place]? Self  Referral name: No data recorded Referral phone number: No data recorded  Whom do you see for routine medical problems? Other (Comment)  Practice/Facility Name: No data recorded Practice/Facility Phone Number: No data recorded Name of Contact: No data  recorded Contact Number: No data recorded Contact Fax Number: No data recorded Prescriber Name: No data recorded Prescriber Address (if known): No data recorded  What Is the Reason for Your Visit/Call Today? No data recorded How Long Has This Been Causing You Problems? > than 6 months  What Do You Feel Would Help You the Most Today? Therapy; Medication   Have You Recently Been in Any Inpatient Treatment (Hospital/Detox/Crisis Center/28-Day Program)? No  Name/Location of Program/Hospital:No data recorded How Long Were You There? No data recorded When Were You Discharged? No data recorded  Have You Ever Received Services From [facility] Before? No  Who Do You See at [facility]? No data recorded  Have You Recently Had Any Thoughts About Hurting Yourself? Yes  Are You Planning to Commit Suicide/Harm Yourself At This time? Yes   Have you Recently Had Thoughts About Hurting Someone [name]? No  Explanation: No data recorded  Have You Used Any Alcohol or Drugs in the Past 24 Hours? No  How Long Ago Did You Use Drugs or Alcohol? No data recorded What Did You Use and How Much? No data recorded  Do You Currently Have a Therapist/Psychiatrist? No  Name of Therapist/Psychiatrist: No data recorded  Have You Been Recently Discharged From Any Office Practice or Programs? No  Explanation of Discharge From Practice/Program: No data recorded    CCA Screening Triage Referral Assessment Type of Contact: Face-to-Face  Is this Initial or Reassessment? No data recorded Date Telepsych consult ordered in [facility]:  No data recorded Time Telepsych consult ordered in [facility]:  No data recorded  Patient Reported Information Reviewed? Yes  Patient Left Without Being Seen? No data recorded Reason for Not Completing Assessment: No data recorded  Collateral Involvement: No data recorded  Does Patient Have a Court Appointed Legal Guardian? No data recorded Name and Contact of Legal Guardian: No data  recorded If Minor and Not Living with Parent(s), Who has Custody? No data recorded Is CPS involved or ever been involved? Never  Is APS involved or ever been involved? Never   Patient Determined To Be At Risk for Harm To Self or Others Based on Review of Patient Reported Information or Presenting Complaint? Yes, for Self-Harm  Method: No data recorded Availability of Means: No data recorded Intent: No data recorded Notification Required: No data recorded Additional Information for Danger to Others Potential: No data recorded Additional Comments for Danger to Others Potential: No data recorded Are There Guns or Other Weapons in Your Home? No data recorded Types of Guns/Weapons: No data recorded Are These Weapons Safely Secured?                            No data recorded Who Could Verify You Are Able To Have These Secured: No data recorded Do You Have any Outstanding Charges, Pending Court Dates, Parole/Probation? No data recorded Contacted To Inform of Risk of Harm To Self or Others: No data recorded  Location of Assessment: [facility] ED   Does Patient Present under Involuntary Commitment? No  IVC Papers Initial File Date: No data recorded  [place] of Residence: [place]   Patient Currently Receiving the Following Services: No data recorded  Determination of Need: Emergent (2 hours)   Options For Referral: No data recorded    CCA Biopsychosocial Intake/Chief Complaint:  Patient is presenting voluntarily due to having depression symptoms and currently off her medications and experiencing AH  Current Symptoms/Problems: Patient is presenting voluntarily due to having depression symptoms and currently off her medications and experiencing AH   Patient Reported Schizophrenia/Schizoaffective Diagnosis in Past: No   Strengths: Unknown  Preferences: Unknown  Abilities: Unknown   Type of Services Patient Feels are Needed: Medication   Initial Clinical Notes/Concerns:  None   Mental Health Symptoms Depression:  Change in energy/activity; Hopelessness; Irritability   Duration of Depressive symptoms: Greater than two weeks   Mania:  None   Anxiety:   Irritability   Psychosis:  Hallucinations   Duration of Psychotic symptoms: Less than six months   Trauma:  None   Obsessions:  None   Compulsions:  None   Inattention:  None   Hyperactivity/Impulsivity:  N/A   Oppositional/Defiant Behaviors:  None   Emotional Irregularity:  None   Other Mood/Personality Symptoms:  No data recorded   Mental Status Exam Appearance and self-care  Stature:  Average   Weight:  Average weight   Clothing:  Casual   Grooming:  Normal   Cosmetic use:  None   Posture/gait:  Normal   Motor activity:  Not Remarkable   Sensorium  Attention:  Normal   Concentration:  Normal   Orientation:  X5   Recall/memory:  Normal   Affect and Mood  Affect:  Depressed   Mood:  Depressed   Relating  Eye contact:  Normal   Facial expression:  Depressed   Attitude toward examiner:  Cooperative   Thought and Language  Speech flow: Clear and Coherent  Thought content:  Appropriate to Mood and Circumstances   Preoccupation:  None   Hallucinations:  Auditory   Organization:  No data recorded  Aff[employer] Computer ServicesExecutive Functions  Fund of Knowledge:  Fair   Intelligence:  Average   Abstraction:  Concrete   Judgement:  Good   Reality Testing:  Adequate   Insight:  Fair   Decision Making:  Normal   Social Functioning  Social Maturity:  Responsible   Social Judgement:  Normal   Stress  Stressors:  Family conflict   Coping Ability:  Normal   Skill Deficits:  None   Supports:  Family     Religion: Religion/Spirituality Are You A Religious Person?: No  Leisure/Recreation: Leisure / Recreation Do You Have Hobbies?: No  Exercise/Diet: Exercise/Diet Do You Exercise?: No Have You Gained or Lost A Significant Amount of Weight in the Past Six  Months?: No Do You Follow a Special Diet?: No Do You Have Any Trouble Sleeping?: No   CCA Employment/Education Employment/Work Situation: Employment / Work Situation Employment situation: Unemployed What is the longest time patient has a held a job?: Unknown Where was the patient employed at that time?: Unknown Has patient ever been in the [employer] and Companymilitary?: No  Education: Education Is Patient Currently Attending School?: No Did Garment/textile technologistYou Graduate From [employer]?:  (Unknown)   CCA Family/Childhood History Family and Relationship History: Family history Marital status: Single Are you sexually active?:  (Unknown) What is your sexual orientation?: Unknown Has your sexual activity been affected by drugs, alcohol, medication, or emotional stress?: None Does patient have children?: Yes How many children?:  (Unknown) How is patient's relationship with their children?: Patient is currently feeling overwhelmed  Childhood History:  Childhood History By whom was/is the patient raised?: Other (Comment) Additional childhood history information: None reported Description of patient's relationship with caregiver when they were a child: None reported Patient's description of current relationship with people who raised him/her: None reported How were you disciplined when you got in trouble as a child/adolescent?: None reported Does patient have siblings?: No Did patient suffer any verbal/emotional/physical/sexual abuse as a child?: No Did patient suffer from severe childhood neglect?: No Has patient ever been sexually abused/assaulted/raped as an adolescent or adult?: No Was the patient ever a victim of a crime or a disaster?: No Witnessed domestic violence?: No Has patient been affected by domestic violence as an adult?: No  Child/Adolescent Assessment:     CCA Substance Use Alcohol/Drug Use: Alcohol / Drug Use Pain Medications: See MAR Prescriptions: See MAR Over the Counter: See  MAR History of alcohol / drug use?: Yes Substance #1 Name of Substance 1: Cocaine                       ASAM's:  Six Dimensions of Multidimensional Assessment  Dimension 1:  Acute Intoxication and/or Withdrawal Potential:      Dimension 2:  Biomedical Conditions and Complications:      Dimension 3:  Emotional, Behavioral, or Cognitive Conditions and Complications:     Dimension 4:  Readiness to Change:     Dimension 5:  Relapse, Continued use, or Continued Problem Potential:     Dimension 6:  Recovery/Living Environment:     ASAM Severity Score:    ASAM Recommended Level of Treatment:     Substance use Disorder (SUD)    Recommendations for Services/Supports/Treatments:    Per Psyc MD Dr. Toni Amendlapacs patient is recommended for Inpatient Hospitalization   DSM5 Diagnoses: Patient Active Problem  List   Diagnosis Date Noted  . MDD (major depressive disorder), recurrent, severe, with psychosis (HCC) 01/24/2020  . Stimulant use disorder 01/24/2020  . Alcohol use disorder, severe, dependence (HCC) 12/06/2019  . MDD (major depressive disorder), recurrent severe, without psychosis (HCC) 12/05/2019  . Alcohol withdrawal (HCC) 11/28/2019  . Suicidal ideation 11/28/2019  . Cocaine dependence (HCC) 03/05/2019  . Major depressive disorder, recurrent episode, severe (HCC) 03/02/2019  . Post traumatic stress disorder (PTSD) 03/02/2019  . Cocaine use disorder (HCC) 03/02/2019  . Depression 03/02/2019  . MDD (major depressive disorder) 03/01/2019    Patient Centered Plan: Patient is on the following Treatment Plan(s):  Depression   Referrals to Alternative Service(s): Referred to Alternative Service(s):   Place:   Date:   Time:    Referred to Alternative Service(s):   Place:   Date:   Time:    Referred to Alternative Service(s):   Place:   Date:   Time:    Referred to Alternative Service(s):   Place:   Date:   Time:     Costa Jha A Euphemia Lingerfelt, LCAS-A

## 2020-03-25 ENCOUNTER — Inpatient Hospital Stay
Admission: RE | Admit: 2020-03-25 | Discharge: 2020-04-11 | DRG: 885 | Disposition: A | Discharge status: Home / Self Care | Payer: 59 | Source: Intra-hospital | Attending: Behavioral Health | Admitting: Behavioral Health

## 2020-03-25 ENCOUNTER — Other Ambulatory Visit: Payer: Self-pay

## 2020-03-25 ENCOUNTER — Encounter: Payer: Self-pay | Admitting: Psychiatry

## 2020-03-25 DIAGNOSIS — M419 Scoliosis, unspecified: Secondary | ICD-10-CM | POA: Diagnosis present

## 2020-03-25 DIAGNOSIS — F1020 Alcohol dependence, uncomplicated: Secondary | ICD-10-CM | POA: Diagnosis present

## 2020-03-25 DIAGNOSIS — F1410 Cocaine abuse, uncomplicated: Secondary | ICD-10-CM | POA: Diagnosis present

## 2020-03-25 DIAGNOSIS — F17210 Nicotine dependence, cigarettes, uncomplicated: Secondary | ICD-10-CM | POA: Diagnosis present

## 2020-03-25 DIAGNOSIS — H269 Unspecified cataract: Secondary | ICD-10-CM | POA: Diagnosis present

## 2020-03-25 DIAGNOSIS — R45851 Suicidal ideations: Secondary | ICD-10-CM | POA: Diagnosis present

## 2020-03-25 DIAGNOSIS — Z8249 Family history of ischemic heart disease and other diseases of the circulatory system: Secondary | ICD-10-CM | POA: Diagnosis not present

## 2020-03-25 DIAGNOSIS — F4312 Post-traumatic stress disorder, chronic: Secondary | ICD-10-CM | POA: Diagnosis present

## 2020-03-25 DIAGNOSIS — G4700 Insomnia, unspecified: Secondary | ICD-10-CM | POA: Diagnosis present

## 2020-03-25 DIAGNOSIS — R519 Headache, unspecified: Secondary | ICD-10-CM | POA: Diagnosis present

## 2020-03-25 DIAGNOSIS — J45909 Unspecified asthma, uncomplicated: Secondary | ICD-10-CM | POA: Diagnosis present

## 2020-03-25 DIAGNOSIS — Z9151 Personal history of suicidal behavior: Secondary | ICD-10-CM | POA: Diagnosis not present

## 2020-03-25 DIAGNOSIS — Z5901 Sheltered homelessness: Secondary | ICD-10-CM

## 2020-03-25 DIAGNOSIS — Z79899 Other long term (current) drug therapy: Secondary | ICD-10-CM

## 2020-03-25 DIAGNOSIS — D649 Anemia, unspecified: Secondary | ICD-10-CM | POA: Diagnosis present

## 2020-03-25 DIAGNOSIS — F4310 Post-traumatic stress disorder, unspecified: Secondary | ICD-10-CM | POA: Diagnosis present

## 2020-03-25 DIAGNOSIS — I10 Essential (primary) hypertension: Secondary | ICD-10-CM | POA: Diagnosis present

## 2020-03-25 DIAGNOSIS — R45850 Homicidal ideations: Secondary | ICD-10-CM | POA: Diagnosis present

## 2020-03-25 DIAGNOSIS — M792 Neuralgia and neuritis, unspecified: Secondary | ICD-10-CM | POA: Diagnosis present

## 2020-03-25 DIAGNOSIS — F1290 Cannabis use, unspecified, uncomplicated: Secondary | ICD-10-CM | POA: Diagnosis present

## 2020-03-25 DIAGNOSIS — M25559 Pain in unspecified hip: Secondary | ICD-10-CM

## 2020-03-25 DIAGNOSIS — F333 Major depressive disorder, recurrent, severe with psychotic symptoms: Secondary | ICD-10-CM | POA: Diagnosis present

## 2020-03-25 MED ORDER — PRAZOSIN HCL 1 MG PO CAPS
1.0000 mg | ORAL_CAPSULE | Freq: Every day | ORAL | Status: DC
  Administered 2020-03-25 – 2020-03-27 (×3): 1 mg via ORAL
  Filled 2020-03-25 (×3): qty 1

## 2020-03-25 MED ORDER — ACETAMINOPHEN 325 MG PO TABS
650.0000 mg | ORAL_TABLET | Freq: Four times a day (QID) | ORAL | Status: DC | PRN
  Administered 2020-03-25 – 2020-04-09 (×20): 650 mg via ORAL
  Filled 2020-03-25 (×21): qty 2

## 2020-03-25 MED ORDER — QUETIAPINE FUMARATE 200 MG PO TABS
300.0000 mg | ORAL_TABLET | Freq: Every day | ORAL | Status: DC
  Administered 2020-03-25 – 2020-03-26 (×2): 300 mg via ORAL
  Filled 2020-03-25 (×2): qty 1

## 2020-03-25 MED ORDER — FLUOXETINE HCL 20 MG PO CAPS
40.0000 mg | ORAL_CAPSULE | Freq: Every day | ORAL | Status: DC
  Administered 2020-03-26 – 2020-03-31 (×6): 40 mg via ORAL
  Filled 2020-03-25 (×6): qty 2

## 2020-03-25 MED ORDER — MAGNESIUM HYDROXIDE 400 MG/5ML PO SUSP: 30.0000 mL | Freq: Every day | ORAL | Status: DC | PRN

## 2020-03-25 MED ORDER — GABAPENTIN 300 MG PO CAPS
300.0000 mg | ORAL_CAPSULE | Freq: Three times a day (TID) | ORAL | Status: DC
  Administered 2020-03-25 – 2020-03-30 (×14): 300 mg via ORAL
  Filled 2020-03-25 (×14): qty 1

## 2020-03-25 MED ORDER — AMLODIPINE BESYLATE 5 MG PO TABS
10.0000 mg | ORAL_TABLET | Freq: Every day | ORAL | Status: DC
  Administered 2020-03-26 – 2020-04-11 (×17): 10 mg via ORAL
  Filled 2020-03-25 (×17): qty 2

## 2020-03-25 MED ORDER — ALUM & MAG HYDROXIDE-SIMETH 200-200-20 MG/5ML PO SUSP: 30.0000 mL | ORAL | Status: DC | PRN

## 2020-03-25 MED ORDER — TRAZODONE HCL 50 MG PO TABS
150.0000 mg | ORAL_TABLET | Freq: Every day | ORAL | Status: DC
  Administered 2020-03-25 – 2020-04-10 (×17): 150 mg via ORAL
  Filled 2020-03-25 (×17): qty 1

## 2020-03-25 NOTE — Plan of Care (Signed)
Patient new to the unit today, hasn't had time to progress  Problem: Education: Goal: Emotional status will improve Outcome: Not Progressing Goal: Mental status will improve Outcome: Not Progressing   

## 2020-03-25 NOTE — Tx Team (Signed)
Initial Treatment Plan 03/25/2020 5:11 PM [name] DYN:[id number]    PATIENT STRESSORS: Financial difficulties Marital or family conflict Medication change or noncompliance   PATIENT STRENGTHS: Ability for insight Average or above average intelligence Communication skills General fund of knowledge   PATIENT IDENTIFIED PROBLEMS: depression    Anxiety     Suicide risk             DISCHARGE CRITERIA:  Ability to meet basic life and health needs Adequate post-discharge living arrangements Improved stabilization in mood, thinking, and/or behavior Need for constant or close observation no longer present Verbal commitment to aftercare and medication compliance  PRELIMINARY DISCHARGE PLAN: Attend aftercare/continuing care group Outpatient therapy  PATIENT/FAMILY INVOLVEMENT: This treatment plan has been presented to and reviewed with the patient, Rachel Nolan. The patient has been given the opportunity to ask questions and make suggestions.  Chalmers Cater, RN 03/25/2020, 5:11 PM

## 2020-03-25 NOTE — BH Assessment (Signed)
Patient can come down at 2pm  Call to give report: [phone]  Patient is to be admitted to [facility] by Dr. Neale Burly  Attending Physician will be. Dr. Neale Burly   Patient has been assigned to room 303, by [facility] Charge Nurse Maryelizabeth Kaufmann, RN.   Intake Paper Work has been signed and placed on patient chart.  ER staff is aware of the admission: 1. Rivka Barbara, ER Secretary  2. Larinda Buttery, ER MD  3. Amy B., Patient's Nurse  4. Ethelene Browns, Patient Access.

## 2020-03-25 NOTE — ED Notes (Signed)
Meal tray given 

## 2020-03-25 NOTE — Progress Notes (Signed)
Pt has worsening depression with passive SI but contracts for safety. Pt says stressors are mainly with her kids and grand kids. She has been off of her meds and has had occasional AH. Pt was oriented to the unit. Pt was educated on care plan and verbalizes understanding.  Torrie Mayers RN

## 2020-03-25 NOTE — Plan of Care (Signed)
New admission  Problem: Education: Goal: Knowledge of [facility] Education information/materials will improve Outcome: Not Progressing Goal: Emotional status will improve Outcome: Not Progressing Goal: Mental status will improve Outcome: Not Progressing Goal: Verbalization of understanding the information provided will improve Outcome: Not Progressing   Problem: Activity: Goal: Interest or engagement in activities will improve Outcome: Not Progressing Goal: Sleeping patterns will improve Outcome: Not Progressing   Problem: Coping: Goal: Ability to verbalize frustrations and anger appropriately will improve Outcome: Not Progressing Goal: Ability to demonstrate self-control will improve Outcome: Not Progressing   Problem: Health Behavior/Discharge Planning: Goal: Identification of resources available to assist in meeting health care needs will improve Outcome: Not Progressing Goal: Compliance with treatment plan for underlying cause of condition will improve Outcome: Not Progressing   Problem: Physical Regulation: Goal: Ability to maintain clinical measurements within normal limits will improve Outcome: Not Progressing   Problem: Safety: Goal: Periods of time without injury will increase Outcome: Not Progressing   Problem: Education: Goal: Utilization of techniques to improve thought processes will improve Outcome: Not Progressing Goal: Knowledge of the prescribed therapeutic regimen will improve Outcome: Not Progressing   Problem: Activity: Goal: Interest or engagement in leisure activities will improve Outcome: Not Progressing Goal: Imbalance in normal sleep/wake cycle will improve Outcome: Not Progressing   Problem: Coping: Goal: Coping ability will improve Outcome: Not Progressing Goal: Will verbalize feelings Outcome: Not Progressing   Problem: Health Behavior/Discharge Planning: Goal: Ability to make decisions will improve Outcome: Not  Progressing Goal: Compliance with therapeutic regimen will improve Outcome: Not Progressing   Problem: Role Relationship: Goal: Will demonstrate positive changes in social behaviors and relationships Outcome: Not Progressing   Problem: Safety: Goal: Ability to disclose and discuss suicidal ideas will improve Outcome: Not Progressing Goal: Ability to identify and utilize support systems that promote safety will improve Outcome: Not Progressing   Problem: Self-Concept: Goal: Will verbalize positive feelings about self Outcome: Not Progressing Goal: Level of anxiety will decrease Outcome: Not Progressing   Problem: Education: Goal: Ability to state activities that reduce stress will improve Outcome: Not Progressing   Problem: Coping: Goal: Ability to identify and develop effective coping behavior will improve Outcome: Not Progressing   Problem: Self-Concept: Goal: Ability to identify factors that promote anxiety will improve Outcome: Not Progressing Goal: Level of anxiety will decrease Outcome: Not Progressing Goal: Ability to modify response to factors that promote anxiety will improve Outcome: Not Progressing   Problem: Education: Goal: Ability to make informed decisions regarding treatment will improve Outcome: Not Progressing   Problem: Coping: Goal: Coping ability will improve Outcome: Not Progressing   Problem: Health Behavior/Discharge Planning: Goal: Identification of resources available to assist in meeting health care needs will improve Outcome: Not Progressing   Problem: Medication: Goal: Compliance with prescribed medication regimen will improve Outcome: Not Progressing   Problem: Self-Concept: Goal: Ability to disclose and discuss suicidal ideas will improve Outcome: Not Progressing Goal: Will verbalize positive feelings about self Outcome: Not Progressing   

## 2020-03-25 NOTE — ED Notes (Signed)
VS will be taken when patient wakes.  

## 2020-03-25 NOTE — ED Notes (Signed)
Meal tray placed in room 

## 2020-03-25 NOTE — Progress Notes (Signed)
Patient calm and cooperative during assessment denying SI/HI/AVH. Patient endorses depression with this writer. Patient isolative to her room this evening but did come up and get snack. Patient compliant with medication administration per MD orders. Pt given education, support, and encouragement to be active in her treatment plan. Patient being monitored Q 15 minutes for safety per unit protocol. Pt remains safe on the unit. 

## 2020-03-25 NOTE — ED Notes (Signed)
Pt transferred into ED BHU room 1    Patient assigned to appropriate care area. Patient oriented to unit/care area: Informed that, for her safety, care areas are designed for safety and monitored by security cameras at all times; Visiting hours and phone times explained to patient. Patient verbalizes understanding, and verbal contract for safety obtained.  Assessment completed  She denies pain

## 2020-03-26 DIAGNOSIS — I10 Essential (primary) hypertension: Secondary | ICD-10-CM | POA: Diagnosis present

## 2020-03-26 DIAGNOSIS — M792 Neuralgia and neuritis, unspecified: Secondary | ICD-10-CM | POA: Diagnosis present

## 2020-03-26 MED ORDER — CLONIDINE HCL 0.1 MG PO TABS
0.2000 mg | ORAL_TABLET | Freq: Once | ORAL | Status: AC
  Administered 2020-03-26: 0.2 mg via ORAL
  Filled 2020-03-26: qty 2

## 2020-03-26 NOTE — Progress Notes (Addendum)
Pt refused 1200 dose of Neurontin. Was approached and pt said she would come for the medication. Pt did not show, went to pt's room and pt was sleeping. Woke pt and she refused said she'll take it after her nap.   Pt is alert and oriented to person, place, time and situation. Pt is calm, cooperative, denies suicidal and homicidal ideation, denies hallucinations and anxiety. Pt is pleasant, affect flat, no distress noted, will continue to monitor pt per Q15 minute face checks and monitor for safety and progress.

## 2020-03-26 NOTE — Progress Notes (Signed)
Recreation Therapy Notes  INPATIENT RECREATION TR PLAN  Patient Details Name: Rachel Nolan MRN: [id number] DOB: 07/10/1970 Today's Date: 03/26/2020  Rec Therapy Plan Is patient appropriate for Therapeutic Recreation?: Yes Treatment times per week: at least 3 Estimated Length of Stay: 5-7 days TR Treatment/Interventions: Group participation (Comment)  Discharge Criteria Pt will be discharged from therapy if:: Discharged Treatment plan/goals/alternatives discussed and agreed upon by:: Patient/family  Discharge Summary     Corry Storie 03/26/2020, 4:01 PM

## 2020-03-26 NOTE — Progress Notes (Signed)
Recreation Therapy Notes ° °Date: 03/26/2020 ° °Time: 9:30 am °  °Location: Craft room  °  °Behavioral response: N/A °  °Intervention Topic: Happiness   ° °Discussion/Intervention: °Patient did not attend group. °  °Clinical Observations/Feedback:  °Patient did not attend group. °  °Shaverence Outlaw LRT/CTRS ° ° ° ° ° ° ° °Shaverence  Outlaw °03/26/2020 4:18 PM °
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
## 2020-03-26 NOTE — [facility] Counselor (Addendum)
Pt was given information on [facility] ([facility]), [facility], and homelessness/shelter resources.   Vilma Meckel. Algis Greenhouse, MSW, LCSW, LCAS 03/26/2020 2:56 PM
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
## 2020-03-26 NOTE — Progress Notes (Signed)
Recreation Therapy Notes  INPATIENT RECREATION THERAPY ASSESSMENT  Patient Details Name: Markeya Mincy MRN: [id number] DOB: [birth year] Today's Date: 03/26/2020       Information Obtained From: Patient  Able to Participate in Assessment/Interview: Yes  Patient Presentation: Responsive  Reason for Admission (Per Patient): Active Symptoms,Med Non-Compliance  Patient Stressors: Family  Coping Skills:   Isolation,Avoidance,Meditate,Other (Comment) (Cry)  Leisure Interests (2+):  Exercise - Nada Maclachlan - TV  Frequency of Recreation/Participation: Weekly  Awareness of Community Resources:  Yes  Community Resources:     Current Use: No  If no, Barriers?:    Expressed Interest in [employer] Information: No  County of Residence:  [place]  Patient Main Form of Transportation: Other (Comment) (Friend)  Patient Strengths:  N/A  Patient Identified Areas of Improvement:  N/A  Patient Goal for Hospitalization:  Stop the voices  Current SI (including self-harm):  Yes (No plan)  Current HI:  No  Current AVH: No  Staff Intervention Plan: Group Attendance,Collaborate with Interdisciplinary Treatment Team  Consent to Intern Participation: N/A  Tynesia Harral 03/26/2020, 4:00 PM
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
## 2020-03-26 NOTE — Tx Team (Addendum)
Interdisciplinary Treatment and Diagnostic Plan Update  03/26/2020 Time of Session: 09:00AM Simrit Gohlke MRN: [id number]  Principal Diagnosis: <principal problem not specified>  Secondary Diagnoses: Active Problems:   Severe recurrent major depression with psychotic features ([place])   Current Medications:  Current Facility-Administered Medications  Medication Dose Route Frequency Provider Last Rate Last Admin  . acetaminophen (TYLENOL) tablet 650 mg  650 mg Oral Q6H PRN Clapacs, Madie Reno, MD   650 mg at 03/25/20 1651  . alum & mag hydroxide-simeth (MAALOX/MYLANTA) 200-200-20 MG/5ML suspension 30 mL  30 mL Oral Q4H PRN Clapacs, John T, MD      . amLODipine (NORVASC) tablet 10 mg  10 mg Oral Daily Clapacs, Madie Reno, MD   10 mg at 03/26/20 0803  . FLUoxetine (PROZAC) capsule 40 mg  40 mg Oral Daily Clapacs, Madie Reno, MD   40 mg at 03/26/20 0803  . gabapentin (NEURONTIN) capsule 300 mg  300 mg Oral TID Clapacs, Madie Reno, MD   300 mg at 03/26/20 0803  . magnesium hydroxide (MILK OF MAGNESIA) suspension 30 mL  30 mL Oral Daily PRN Clapacs, John T, MD      . prazosin (MINIPRESS) capsule 1 mg  1 mg Oral QHS Clapacs, John T, MD   1 mg at 03/25/20 2134  . QUEtiapine (SEROQUEL) tablet 300 mg  300 mg Oral QHS Clapacs, John T, MD   300 mg at 03/25/20 2134  . traZODone (DESYREL) tablet 150 mg  150 mg Oral QHS Clapacs, Madie Reno, MD   150 mg at 03/25/20 2133   PTA Medications: Medications Prior to Admission  Medication Sig Dispense Refill Last Dose  . amLODipine (NORVASC) 10 MG tablet Take 1 tablet (10 mg total) by mouth daily. 30 tablet 1   . FLUoxetine (PROZAC) 40 MG capsule Take 1 capsule (40 mg total) by mouth daily. 30 capsule 1   . gabapentin (NEURONTIN) 300 MG capsule Take 1 capsule (300 mg total) by mouth 3 (three) times daily. 90 capsule 1   . prazosin (MINIPRESS) 1 MG capsule Take 3 capsules (3 mg total) by mouth at bedtime. 30 capsule 1   . QUEtiapine (SEROQUEL) 300 MG tablet Take 1 tablet (300 mg  total) by mouth at bedtime. 30 tablet 1   . traZODone (DESYREL) 150 MG tablet Take 1 tablet (150 mg total) by mouth at bedtime as needed for sleep. 30 tablet 1     Patient Stressors: Financial difficulties Marital or family conflict Medication change or noncompliance  Patient Strengths: Ability for insight Average or above average intelligence Communication skills General fund of knowledge  Treatment Modalities: Medication Management, Group therapy, Case management,  1 to 1 session with clinician, Psychoeducation, Recreational therapy.   Physician Treatment Plan for Primary Diagnosis: <principal problem not specified> Long Term Goal(s):     Short Term Goals:    Medication Management: Evaluate patient's response, side effects, and tolerance of medication regimen.  Therapeutic Interventions: 1 to 1 sessions, Unit Group sessions and Medication administration.  Evaluation of Outcomes: Not Met  Physician Treatment Plan for Secondary Diagnosis: Active Problems:   Severe recurrent major depression with psychotic features ([place])  Long Term Goal(s):     Short Term Goals:       Medication Management: Evaluate patient's response, side effects, and tolerance of medication regimen.  Therapeutic Interventions: 1 to 1 sessions, Unit Group sessions and Medication administration.  Evaluation of Outcomes: Not Met   RN Treatment Plan for Primary Diagnosis: <principal problem not specified>  Long Term Goal(s): Knowledge of disease and therapeutic regimen to maintain health will improve  Short Term Goals: Ability to remain free from injury will improve, Ability to verbalize frustration and anger appropriately will improve, Ability to demonstrate self-control, Ability to participate in decision making will improve, Ability to verbalize feelings will improve, Ability to disclose and discuss suicidal ideas, Ability to identify and develop effective coping behaviors will improve and Compliance with  prescribed medications will improve  Medication Management: RN will administer medications as ordered by provider, will assess and evaluate patient's response and provide education to patient for prescribed medication. RN will report any adverse and/or side effects to prescribing provider.  Therapeutic Interventions: 1 on 1 counseling sessions, Psychoeducation, Medication administration, Evaluate responses to treatment, Monitor vital signs and CBGs as ordered, Perform/monitor CIWA, COWS, AIMS and Fall Risk screenings as ordered, Perform wound care treatments as ordered.  Evaluation of Outcomes: Not Met   LCSW Treatment Plan for Primary Diagnosis: <principal problem not specified> Long Term Goal(s): Safe transition to appropriate next level of care at discharge, Engage patient in therapeutic group addressing interpersonal concerns.  Short Term Goals: Engage patient in aftercare planning with referrals and resources, Increase social support, Increase ability to appropriately verbalize feelings, Increase emotional regulation, Facilitate patient progression through stages of change regarding substance use diagnoses and concerns, Identify triggers associated with mental health/substance abuse issues and Increase skills for wellness and recovery  Therapeutic Interventions: Assess for all discharge needs, 1 to 1 time with Social worker, Explore available resources and support systems, Assess for adequacy in community support network, Educate family and significant other(s) on suicide prevention, Complete Psychosocial Assessment, Interpersonal group therapy.  Evaluation of Outcomes: Not Met   Progress in Treatment: Attending groups: No. Participating in groups: No. Taking medication as prescribed: Yes. Toleration medication: Yes. Family/Significant other contact made: No, will contact:  once permission is given Patient understands diagnosis: Yes. Discussing patient identified problems/goals with  staff: Yes. Medical problems stabilized or resolved: Yes. Denies suicidal/homicidal ideation: Yes. Issues/concerns per patient self-inventory: No. Other: None  New problem(s) identified: No, Describe:  None  New Short Term/Long Term Goal(s):  Ability to verbalize frustration and anger appropriately will improve, Ability to demonstrate self-control, Ability to participate in decision making will improve, Ability to verbalize feelings will improve, Ability to disclose and discuss suicidal ideas, Ability to identify and develop effective coping behaviors will improve and Compliance with prescribed medications will improve Patient Goals:  "Not wanting to hear far off voices anymore." Pt stated that he also wants to get back on medications and improve mood.   Discharge Plan or Barriers: CSW will assist pt with procuring follow-up care and transportation home upon discharge.   Reason for Continuation of Hospitalization: Depression Hallucinations Medical Issues Medication stabilization Suicidal ideation  Estimated Length of Stay: 1-7 days  Recreational Therapy: Patient Stressors: N/A Patient Goal: Patient will engage in groups without prompting or encouragement from LRT x3 group sessions within 5 recreation therapy group sessions.  Attendees: Patient: Rachel Nolan 03/26/2020 9:59 AM  Physician: Salley Scarlet, MD 03/26/2020 9:59 AM  Nursing:  03/26/2020 9:59 AM  RN Care Manager: 03/26/2020 9:59 AM  Social Worker: Assunta Curtis, MSW, LCSW 03/26/2020 9:59 AM  Recreational Therapist: Roanna Epley, Reather Converse, LRT  03/26/2020 9:59 AM  Other: Michell Heinrich, MSW, [place], LCAS 03/26/2020 9:59 AM  Other:Kiva [place], MSW, [place]  03/26/2020 9:59 AM  Other: 03/26/2020 9:59 AM    Scribe for Treatment Team: Kiva A [place], LCSWA  03/26/2020 9:59 AM

## 2020-03-26 NOTE — Plan of Care (Signed)
Patient endorses anxiety and depression but states she is feeling better   Problem: Education: Goal: Emotional status will improve Outcome: Progressing Goal: Mental status will improve Outcome: Progressing

## 2020-03-26 NOTE — [facility] Group Notes (Signed)
LCSW Group Therapy Note  03/26/2020 2:12 PM  Type of Therapy/Topic:  Group Therapy:  Emotion Regulation  Participation Level:  Did Not Attend   Description of Group:   The purpose of this group is to assist patients in learning to regulate negative emotions and experience positive emotions. Patients will be guided to discuss ways in which they have been vulnerable to their negative emotions. These vulnerabilities will be juxtaposed with experiences of positive emotions or situations, and patients will be challenged to use positive emotions to combat negative ones. Special emphasis will be placed on coping with negative emotions in conflict situations, and patients will process healthy conflict resolution skills.  Therapeutic Goals: 1. Patient will identify two positive emotions or experiences to reflect on in order to balance out negative emotions 2. Patient will label two or more emotions that they find the most difficult to experience 3. Patient will demonstrate positive conflict resolution skills through discussion and/or role plays  Summary of Patient Progress: X   Therapeutic Modalities:   Cognitive Behavioral Therapy Feelings Identification Dialectical Behavioral Therapy  Penni Homans, MSW, LCSW 03/26/2020 2:12 PM

## 2020-03-26 NOTE — Progress Notes (Signed)
Patient BP 140/114 this morning, NP notified. Check MAR.

## 2020-03-26 NOTE — [facility] Suicide Risk Assessment (Signed)
[facility] INPATIENT:  Family/Significant Other Suicide Prevention Education  Suicide Prevention Education:  Patient Refusal for Family/Significant Other Suicide Prevention Education: The patient Rachel Nolan has refused to provide written consent for family/significant other to be provided Family/Significant Other Suicide Prevention Education during admission and/or prior to discharge.  Physician notified.  SPE completed with pt, as pt refused to consent to family contact. SPI pamphlet provided to pt and pt was encouraged to share information with support network, ask questions, and talk about any concerns relating to SPE. Pt denies access to guns/firearms and verbalized understanding of information provided. Mobile Crisis information also provided to pt.  Glenis Smoker 03/26/2020, 10:57 AM

## 2020-03-26 NOTE — Progress Notes (Signed)
Pt is alert and oriented to person, place, time and situation. Pt is calm, cooperative, affect is blunted, eye contact is fair. Pt denies suicidal and homicidal ideation, denies hallucinations, denies anxiety, reports depression, when asked to rate it on a 0-10 scale, 10 being worst, pt replies, "I don't know what number." Pt uses walker to ambulate, complains of not being strong enough to stand for long periods of time, requested to sit in a chair rather than stand at the med room counter to take her meds, then took her meds sitting. Per HS shift pt was hypertensive and they gave her a PRN BP med, and this morning BP was rechecks and within normal limits. Unit psychiatrist, Dr. Neale Burly was notified of pt's mobility complaints, and limitations and that HS shift reported pt had a history of a recent fall at home prior to admission, and MD placed a PT consult for pt. Pt t reports her appetite is good, reports she slept well last night. Pt forwards little, but does answer assessment questions with brief but appropriate answers. Will continue to monitor pt per Q15 minute face checks and monitor for safety and progress.
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
## 2020-03-26 NOTE — Progress Notes (Signed)
Patient calm and cooperative during assessment denying SI/HI/AVH. Patient endorses depression with this Clinical research associate. Patient isolative to her room this evening but did come up and get snack. Patient compliant with medication administration per MD orders. Pt given education, support, and encouragement to be active in her treatment plan. Patient being monitored Q 15 minutes for safety per unit protocol. Pt remains safe on the unit.

## 2020-03-27 MED ORDER — QUETIAPINE FUMARATE 200 MG PO TABS
400.0000 mg | ORAL_TABLET | Freq: Every day | ORAL | Status: DC
  Administered 2020-03-27 – 2020-04-10 (×15): 400 mg via ORAL
  Filled 2020-03-27 (×15): qty 2

## 2020-03-27 NOTE — Progress Notes (Signed)
Recreation Therapy Notes    Date: 03/27/2020  Time: 9:30 am   Location: Craft room    Behavioral response: N/A   Intervention Topic: Coping-skills   Discussion/Intervention: Patient did not attend group.   Clinical Observations/Feedback:  Patient did not attend group.   Shaverence Outlaw LRT/CTRS        Shaverence  Outlaw 03/27/2020 12:40 PM

## 2020-03-27 NOTE — Progress Notes (Signed)
[facility] MD Progress Note  03/27/2020 12:18 PM Rachel Nolan  MRN:  [id number]   CC: "Still not doing great"  Subjective:  50 year old female with major depressive disorder and PTSD presenting for worsening mood, suicidal ideations, and homicidal ideations. No acute events overnight, medication compliant, attending to ADLs.   Patient see one-on-one today. She notes that she is still feeling down and depressed today. She reports continued passive suicidal ideations feeling that it is not worth living in her current state. However, she denies active suicidal ideations or homicidal ideations today. She continues to report auditory hallucinations of her father speaking far away, and gunshots in the distance. She denies visual hallucinations. She feels her sleep is still poor as well, though denies nightmares. She is agreeable to increase in Seroquel for hallucinations and mood.   Principal Problem: Severe recurrent major depression with psychotic features (HCC) Diagnosis: Principal Problem:   Severe recurrent major depression with psychotic features (HCC) Active Problems:   Post traumatic stress disorder (PTSD)   Cocaine use disorder (HCC)   Alcohol use disorder, severe, dependence (HCC)   HTN (hypertension)   Neuropathic pain  Total Time spent with patient: 30 minutes  Past Psychiatric History: See H&P  Past Medical History:  Past Medical History:  Diagnosis Date  . Anxiety   . Asthma   . Depression   . Hypertension   . Scoliosis     Past Surgical History:  Procedure Laterality Date  . BACK SURGERY    . ECTOPIC PREGNANCY SURGERY     Family History:  Family History  Problem Relation Age of Onset  . Hypertension Mother   . Hypertension Father    Family Psychiatric  History: See H&P Social History:  Social History   Substance and Sexual Activity  Alcohol Use Yes   Comment: drinks every other day     Social History   Substance and Sexual Activity  Drug Use Yes  . Types:  Marijuana, Cocaine, "Crack" cocaine   Comment: Currently using crack    Social History   Socioeconomic History  . Marital status: Widowed    Spouse name: Not on file  . Number of children: Not on file  . Years of education: Not on file  . Highest education level: Not on file  Occupational History  . Not on file  Tobacco Use  . Smoking status: Current Every Day Smoker    Packs/day: 0.50    Types: Cigarettes  . Smokeless tobacco: Never Used  Vaping Use  . Vaping Use: Never used  Substance and Sexual Activity  . Alcohol use: Yes    Comment: drinks every other day  . Drug use: Yes    Types: Marijuana, Cocaine, "Crack" cocaine    Comment: Currently using crack  . Sexual activity: Not Currently  Other Topics Concern  . Not on file  Social History Narrative   Pt is homeless, no fixed address; not followed by an outpatient psychiatrist   Social Determinants of Health   Financial Resource Strain: Not on file  Food Insecurity: Not on file  Transportation Needs: Not on file  Physical Activity: Not on file  Stress: Not on file  Social Connections: Not on file   Additional Social History:                         Sleep: Poor  Appetite:  Fair  Current Medications: Current Facility-Administered Medications  Medication Dose Route Frequency Provider Last Rate Last Admin  .  acetaminophen (TYLENOL) tablet 650 mg  650 mg Oral Q6H PRN Clapacs, Jackquline Denmark, MD   650 mg at 03/25/20 1651  . alum & mag hydroxide-simeth (MAALOX/MYLANTA) 200-200-20 MG/5ML suspension 30 mL  30 mL Oral Q4H PRN Clapacs, John T, MD      . amLODipine (NORVASC) tablet 10 mg  10 mg Oral Daily Clapacs, Jackquline Denmark, MD   10 mg at 03/27/20 0823  . FLUoxetine (PROZAC) capsule 40 mg  40 mg Oral Daily Clapacs, Jackquline Denmark, MD   40 mg at 03/27/20 0823  . gabapentin (NEURONTIN) capsule 300 mg  300 mg Oral TID Clapacs, Jackquline Denmark, MD   300 mg at 03/27/20 1127  . magnesium hydroxide (MILK OF MAGNESIA) suspension 30 mL  30 mL Oral  Daily PRN Clapacs, John T, MD      . prazosin (MINIPRESS) capsule 1 mg  1 mg Oral QHS Clapacs, John T, MD   1 mg at 03/26/20 2153  . QUEtiapine (SEROQUEL) tablet 400 mg  400 mg Oral QHS Jesse Sans, MD      . traZODone (DESYREL) tablet 150 mg  150 mg Oral QHS Clapacs, Jackquline Denmark, MD   150 mg at 03/26/20 2153    Lab Results: No results found for this or any previous visit (from the past 48 hour(s)).  Blood Alcohol level:  Lab Results  Component Value Date   ETH <10 03/24/2020   ETH <10 01/22/2020    Metabolic Disorder Labs: Lab Results  Component Value Date   HGBA1C 5.5 01/24/2020   MPG 111.15 01/24/2020   MPG 105.41 12/07/2019   No results found for: PROLACTIN Lab Results  Component Value Date   CHOL 148 01/24/2020   TRIG 72 01/24/2020   HDL 67 01/24/2020   CHOLHDL 2.2 01/24/2020   VLDL 14 01/24/2020   LDLCALC 67 01/24/2020   LDLCALC 83 12/07/2019    Physical Findings: AIMS:  , ,  ,  ,    CIWA:    COWS:     Musculoskeletal: Strength & Muscle Tone: within normal limits Gait & Station: normal Patient leans: N/A  Psychiatric Specialty Exam:  Presentation  General Appearance: Disheveled  Eye Contact:Fair  Speech:Clear and Coherent  Speech Volume:Decreased  Handedness:Right   Mood and Affect  Mood:Depressed; Dysphoric  Affect:Congruent; Tearful   Thought Process  Thought Processes:Coherent  Descriptions of Associations:Intact  Orientation:Full (Time, Place and Person)  Thought Content:Abstract Reasoning  History of Schizophrenia/Schizoaffective disorder:No  Duration of Psychotic Symptoms:Less than six months  Hallucinations:Hallucinations: Auditory; Visual Description of Auditory Hallucinations: voices speaking at a distance, gun shots Description of Visual Hallucinations: seeing blood on her hands  Ideas of Reference:None  Suicidal Thoughts:Suicidal Thoughts: Yes, Active SI Active Intent and/or Plan: With Intent; With Plan  Homicidal  Thoughts:Homicidal Thoughts: Yes, Active HI Active Intent and/or Plan: With Intent; With Plan   Sensorium  Memory:Immediate Fair; Recent Fair; Remote Fair  Judgment:Intact  Insight:Fair   Executive Functions  Concentration:Fair  Attention Span:Fair  Recall:Fair  Fund of Knowledge:Fair  Language:Fair   Psychomotor Activity  Psychomotor Activity:Psychomotor Activity: Decreased   Assets  Assets:Desire for Improvement; Resilience; Social Support   Sleep  Sleep:Sleep: Fair    Physical Exam: Physical Exam ROS Blood pressure (!) 139/96, pulse (!) 104, temperature 97.6 F (36.4 C), temperature source Oral, resp. rate 16, height 5\' 6"  (1.676 m), weight 84.4 kg, last menstrual period 06/19/2018, SpO2 97 %. Body mass index is 30.02 kg/m.   Treatment Plan Summary: Daily contact with  patient to assess and evaluate symptoms and progress in treatment and Medication management  1) MDD, recurrent, severe with psychotic features- established problem, unstable - Increase Seroquel 400 mg nightly for mood and hallucinations, fluoxetine 40 mg daily, trazodone 150 mg QHS for insomnia   2) PTSD, chronic- established problem, unstable - Prozac as above, prazosin 1 mg QHS for nightmares  3) HTN- established problem - Amlodipine 10 mg daily  4) Neuropathic pain - Continue gabapentin 300 mg TID   Jesse Sans, MD 03/27/2020, 12:18 PM

## 2020-03-27 NOTE — [facility] Group Notes (Signed)
LCSW Group Therapy Note  03/27/2020 1:48 PM  Type of Therapy/Topic:  Group Therapy:  Balance in Life  Participation Level:  Did Not Attend  Description of Group:    This group will address the concept of balance and how it feels and looks when one is unbalanced. Patients will be encouraged to process areas in their lives that are out of balance and identify reasons for remaining unbalanced. Facilitators will guide patients in utilizing problem-solving interventions to address and correct the stressor making their life unbalanced. Understanding and applying boundaries will be explored and addressed for obtaining and maintaining a balanced life. Patients will be encouraged to explore ways to assertively make their unbalanced needs known to significant others in their lives, using other group members and facilitator for support and feedback.  Therapeutic Goals: 1. Patient will identify two or more emotions or situations they have that consume much of in their lives. 2. Patient will identify signs/triggers that life has become out of balance:  3. Patient will identify two ways to set boundaries in order to achieve balance in their lives:  4. Patient will demonstrate ability to communicate their needs through discussion and/or role plays  Summary of Patient Progress: X  Therapeutic Modalities:   Cognitive Behavioral Therapy Solution-Focused Therapy Assertiveness Training  Simona Huh R. Algis Greenhouse, MSW, LCSW, LCAS 03/27/2020 1:48 PM

## 2020-03-27 NOTE — Progress Notes (Signed)
Pt is alert and oriented to person, place, time and situation. Pt is calm, cooperative, pleasant, denies homicidal ideation, reports passive suicidal ideation without plan, contracts verbally for no self harm. Pt has a flat affect, makes fair eye contact, reports feelings of depression rating it 6/10 on a 0-10 scale, 10 being worst. Pt provided emotional support. Pt uses walker, gait is steady with walker. Pt is medication compliant, reports she slept well and appetite is good. Pt often isolates in her room resting quietly in bed, out for meals and medication with gentle encouragement. Will continue to monitor pt per Q14 minute face checks and for safety and progress.

## 2020-03-28 MED ORDER — PRAZOSIN HCL 2 MG PO CAPS
2.0000 mg | ORAL_CAPSULE | Freq: Every day | ORAL | Status: DC
  Administered 2020-03-28 – 2020-03-30 (×3): 2 mg via ORAL
  Filled 2020-03-28 (×3): qty 1

## 2020-03-28 NOTE — Progress Notes (Signed)
Pt is alert and oriented to person, place, time and situation. Pt is calm, cooperative, pleasant, affect is flat, pt makes poor eye contact, isolates in her room, reports feelings of sadness and depression, rates it an 8 on a 0-10 scale, 10 being worst, denies anxiety. With encouragement pt will come out for meals and meds. Pt is noted to not interact much with staff or peers. Pt is medication compliant, uses a walker, gait steady with walker. Pt has passive suicidal ideation without plans. Pt denies homicidal ideation, reports some auditory hallucinations intermittently, denies it is the command type, hears voices in the distance. Will continue to monitor pt per Q15 minute face checks and monitor for safety and progress.

## 2020-03-28 NOTE — Progress Notes (Signed)
Patient noted in bed asleep early shift. Awakened for snack and meds. Pt endorses depression and some passive SI. Verbally contracts for safety. Pt reports will attempt to attend group tomorrow. Pt given med for sleep with partial relief. Noted in room in bed eyes open. Encouragement and support provided. Encouraged pt to attend groups and socialize with peers.  Encouragement and support provided. Safety checks maintained. Media cations given as prescribed. Pt receptive and remains safe on unit with q 15 min checks.

## 2020-03-28 NOTE — [facility] Group Notes (Signed)
[facility] Group Notes:  (Nursing/MHT/Case Management/Adjunct)  Date:  03/28/2020  Time:  6:06 AM  Type of Therapy:  wrap up  Participation Level:  Did Not Attend    Rachel Nolan 03/28/2020, 6:06 AM

## 2020-03-28 NOTE — Progress Notes (Signed)
[facility] MD Progress Note  03/28/2020 11:49 AM Rachel Nolan  MRN:  [id number]   CC: "Terrible night"  Subjective:  50 year old female with major depressive disorder and PTSD presenting for worsening mood, suicidal ideations, and homicidal ideations. No acute events overnight, medication compliant, attending to ADLs.   Patient see one-on-one today. She notes that she had a horrendous nightmare last night about her nephew being shot that felt very realistic. She had difficulty falling back asleep afterward. She notes that she has been having frequent nightmares, and would like Prazosin to be increased. Today she had thoughts about wishing she was dead, but no active suicidal ideations or plans. She also denies visual hallucinations and homicidal ideations. She continues to hear gunshots and voices far away.    Principal Problem: Severe recurrent major depression with psychotic features (HCC) Diagnosis: Principal Problem:   Severe recurrent major depression with psychotic features (HCC) Active Problems:   Post traumatic stress disorder (PTSD)   Cocaine use disorder (HCC)   Alcohol use disorder, severe, dependence (HCC)   HTN (hypertension)   Neuropathic pain  Total Time spent with patient: 30 minutes  Past Psychiatric History: See H&P  Past Medical History:  Past Medical History:  Diagnosis Date  . Anxiety   . Asthma   . Depression   . Hypertension   . Scoliosis     Past Surgical History:  Procedure Laterality Date  . BACK SURGERY    . ECTOPIC PREGNANCY SURGERY     Family History:  Family History  Problem Relation Age of Onset  . Hypertension Mother   . Hypertension Father    Family Psychiatric  History: See H&P Social History:  Social History   Substance and Sexual Activity  Alcohol Use Yes   Comment: drinks every other day     Social History   Substance and Sexual Activity  Drug Use Yes  . Types: Marijuana, Cocaine, "Crack" cocaine   Comment: Currently using  crack    Social History   Socioeconomic History  . Marital status: Widowed    Spouse name: Not on file  . Number of children: Not on file  . Years of education: Not on file  . Highest education level: Not on file  Occupational History  . Not on file  Tobacco Use  . Smoking status: Current Every Day Smoker    Packs/day: 0.50    Types: Cigarettes  . Smokeless tobacco: Never Used  Vaping Use  . Vaping Use: Never used  Substance and Sexual Activity  . Alcohol use: Yes    Comment: drinks every other day  . Drug use: Yes    Types: Marijuana, Cocaine, "Crack" cocaine    Comment: Currently using crack  . Sexual activity: Not Currently  Other Topics Concern  . Not on file  Social History Narrative   Pt is homeless, no fixed address; not followed by an outpatient psychiatrist   Social Determinants of Health   Financial Resource Strain: Not on file  Food Insecurity: Not on file  Transportation Needs: Not on file  Physical Activity: Not on file  Stress: Not on file  Social Connections: Not on file   Additional Social History:                         Sleep: Poor  Appetite:  Fair  Current Medications: Current Facility-Administered Medications  Medication Dose Route Frequency Provider Last Rate Last Admin  . acetaminophen (TYLENOL) tablet 650 mg  650 mg Oral Q6H PRN Clapacs, Jackquline Denmark, MD   650 mg at 03/25/20 1651  . alum & mag hydroxide-simeth (MAALOX/MYLANTA) 200-200-20 MG/5ML suspension 30 mL  30 mL Oral Q4H PRN Clapacs, John T, MD      . amLODipine (NORVASC) tablet 10 mg  10 mg Oral Daily Clapacs, Jackquline Denmark, MD   10 mg at 03/28/20 0823  . FLUoxetine (PROZAC) capsule 40 mg  40 mg Oral Daily Clapacs, Jackquline Denmark, MD   40 mg at 03/28/20 0823  . gabapentin (NEURONTIN) capsule 300 mg  300 mg Oral TID Clapacs, Jackquline Denmark, MD   300 mg at 03/28/20 0823  . magnesium hydroxide (MILK OF MAGNESIA) suspension 30 mL  30 mL Oral Daily PRN Clapacs, John T, MD      . prazosin (MINIPRESS)  capsule 2 mg  2 mg Oral QHS Jesse Sans, MD      . QUEtiapine (SEROQUEL) tablet 400 mg  400 mg Oral QHS Jesse Sans, MD   400 mg at 03/27/20 2113  . traZODone (DESYREL) tablet 150 mg  150 mg Oral QHS Clapacs, Jackquline Denmark, MD   150 mg at 03/27/20 2113    Lab Results: No results found for this or any previous visit (from the past 48 hour(s)).  Blood Alcohol level:  Lab Results  Component Value Date   ETH <10 03/24/2020   ETH <10 01/22/2020    Metabolic Disorder Labs: Lab Results  Component Value Date   HGBA1C 5.5 01/24/2020   MPG 111.15 01/24/2020   MPG 105.41 12/07/2019   No results found for: PROLACTIN Lab Results  Component Value Date   CHOL 148 01/24/2020   TRIG 72 01/24/2020   HDL 67 01/24/2020   CHOLHDL 2.2 01/24/2020   VLDL 14 01/24/2020   LDLCALC 67 01/24/2020   LDLCALC 83 12/07/2019    Physical Findings: AIMS:  , ,  ,  ,    CIWA:    COWS:     Musculoskeletal: Strength & Muscle Tone: within normal limits Gait & Station: normal Patient leans: N/A  Psychiatric Specialty Exam:  Presentation  General Appearance: Disheveled  Eye Contact:Minimal  Speech:Normal Rate  Speech Volume:Normal  Handedness:Right   Mood and Affect  Mood:Depressed; Dysphoric  Affect:Congruent; Constricted   Thought Process  Thought Processes:Coherent  Descriptions of Associations:Intact  Orientation:Full (Time, Place and Person)  Thought Content:Perseveration  History of Schizophrenia/Schizoaffective disorder:No  Duration of Psychotic Symptoms:Less than six months  Hallucinations:Hallucinations: Auditory  Ideas of Reference:None  Suicidal Thoughts:Suicidal Thoughts: Yes, Passive SI Passive Intent and/or Plan: Without Intent  Homicidal Thoughts:Homicidal Thoughts: No   Sensorium  Memory:Immediate Fair  Judgment:Fair  Insight:Fair   Executive Functions  Concentration:Fair  Attention Span:Fair  Recall:Fair  Fund of  Knowledge:Fair  Language:Fair   Psychomotor Activity  Psychomotor Activity:Psychomotor Activity: Decreased   Assets  Assets:Communication Skills; Desire for Improvement   Sleep  Sleep:Sleep: Fair    Physical Exam: Physical Exam  ROS  Blood pressure (!) 107/91, pulse (!) 102, temperature 98.1 F (36.7 C), temperature source Oral, resp. rate 18, height 5\' 6"  (1.676 m), weight 84.4 kg, last menstrual period 06/19/2018, SpO2 98 %. Body mass index is 30.02 kg/m.   Treatment Plan Summary: Daily contact with patient to assess and evaluate symptoms and progress in treatment and Medication management  1) MDD, recurrent, severe with psychotic features- established problem, unstable - Continue Seroquel 400 mg nightly for mood and hallucinations, fluoxetine 40 mg daily, trazodone 150 mg QHS for insomnia  2) PTSD, chronic- established problem, unstable - Prozac as above, increase prazosin 2 mg QHS for nightmares  3) HTN- established problem - Amlodipine 10 mg daily  4) Neuropathic pain - Continue gabapentin 300 mg TID   03/28/20: Psychiatric exam above reviewed and remains accurate. Assessment and plan above reviewed and updated.    Jesse Sans, MD 03/28/2020, 11:49 AM

## 2020-03-28 NOTE — Progress Notes (Signed)
Recreation Therapy Notes   Date: 03/28/2020  Time: 9:30 am   Location: Craft room    Behavioral response: N/A   Intervention Topic: Goals    Discussion/Intervention: Patient did not attend group.   Clinical Observations/Feedback:  Patient did not attend group.   Danylle Ouk LRT/CTRS        Fendi Meinhardt 03/28/2020 11:14 AM 

## 2020-03-28 NOTE — [facility] Group Notes (Signed)
LCSW Group Therapy Note     03/28/2020 2:32 PM     Type of Therapy and Topic:  Group Therapy:  Feelings around Relapse and Recovery     Participation Level:  Did Not Attend     Description of Group:    Patients in this group will discuss emotions they experience before and after a relapse. They will process how experiencing these feelings, or avoidance of experiencing them, relates to having a relapse. Facilitator will guide patients to explore emotions they have related to recovery. Patients will be encouraged to process which emotions are more powerful. They will be guided to discuss the emotional reaction significant others in their lives may have to their relapse or recovery. Patients will be assisted in exploring ways to respond to the emotions of others without this contributing to a relapse.     Therapeutic Goals:  1.    Patient will identify two or more emotions that lead to a relapse for them  2.    Patient will identify two emotions that result when they relapse  3.    Patient will identify two emotions related to recovery  4.    Patient will demonstrate ability to communicate their needs through discussion and/or role plays        Summary of Patient Progress: X   Therapeutic Modalities:   Cognitive Behavioral Therapy  Solution-Focused Therapy  Assertiveness Training  Relapse Prevention Therapy        Oliviya Gilkison [place], MSW, LCSW-A  03/28/2020 2:32 PM

## 2020-03-28 NOTE — [facility] Counselor (Signed)
CSW attempted to contact [place] at [facility].  CSW left HIPAA compliant voicemail.  Penni Homans, MSW, LCSW 03/28/2020 4:27 PM

## 2020-03-28 NOTE — Plan of Care (Signed)
°  Problem: Education: Goal: Knowledge of [facility] Education information/materials will improve Outcome: Progressing Goal: Emotional status will improve Outcome: Not Progressing Goal: Mental status will improve Outcome: Progressing Goal: Verbalization of understanding the information provided will improve Outcome: Progressing   Problem: Activity: Goal: Interest or engagement in activities will improve Outcome: Not Progressing Goal: Sleeping patterns will improve Outcome: Not Progressing   Problem: Coping: Goal: Ability to verbalize frustrations and anger appropriately will improve Outcome: Not Progressing Goal: Ability to demonstrate self-control will improve Outcome: Progressing

## 2020-03-29 DIAGNOSIS — F333 Major depressive disorder, recurrent, severe with psychotic symptoms: Principal | ICD-10-CM

## 2020-03-29 MED ORDER — ALBUTEROL SULFATE HFA 108 (90 BASE) MCG/ACT IN AERS
1.0000 | INHALATION_SPRAY | Freq: Three times a day (TID) | RESPIRATORY_TRACT | Status: DC | PRN
  Administered 2020-03-31 – 2020-04-08 (×4): 2 via RESPIRATORY_TRACT
  Filled 2020-03-29: qty 6.7

## 2020-03-29 MED ORDER — ALBUTEROL SULFATE HFA 108 (90 BASE) MCG/ACT IN AERS
1.0000 | INHALATION_SPRAY | Freq: Three times a day (TID) | RESPIRATORY_TRACT | Status: DC
  Filled 2020-03-29: qty 6.7

## 2020-03-29 NOTE — [facility] Group Notes (Signed)
[facility] LCSW Group Therapy Note  Date/Time: 03/29/2020 @ 1pm  Type of Therapy/Topic:  Group Therapy:  Feelings about Diagnosis  Participation Level:  Did Not Attend   Mood:  Did not attend   Description of Group:    This group will allow patients to explore their thoughts and feelings about diagnoses they have received. Patients will be guided to explore their level of understanding and acceptance of these diagnoses. Facilitator will encourage patients to process their thoughts and feelings about the reactions of others to their diagnosis, and will guide patients in identifying ways to discuss their diagnosis with significant others in their lives. This group will be process-oriented, with patients participating in exploration of their own experiences as well as giving and receiving support and challenge from other group members.   Therapeutic Goals: 1. Patient will demonstrate understanding of diagnosis as evidence by identifying two or more symptoms of the disorder:  2. Patient will be able to express two feelings regarding the diagnosis 3. Patient will demonstrate ability to communicate their needs through discussion and/or role plays  Summary of Patient Progress:    Patient did not attend group today.     Therapeutic Modalities:   Cognitive Behavioral Therapy Brief Therapy Feelings Identification   Jasmine Davis, LCSW  

## 2020-03-29 NOTE — Progress Notes (Signed)
[facility] MD Progress Note  03/29/2020 1:15 PM Rachel Nolan  MRN:  [id number]  Principal Problem: Severe recurrent major depression with psychotic features [facility]) Diagnosis: Principal Problem:   Severe recurrent major depression with psychotic features (HCC) Active Problems:   Post traumatic stress disorder (PTSD)   Cocaine use disorder (HCC)   Alcohol use disorder, severe, dependence (HCC)   HTN (hypertension)   Neuropathic pain  Rachel Nolan is a 50y.o. female with major depressive disorder and PTSD presenting for worsening mood, suicidal ideations, and homicidal ideations.   Interval History Patient was seen today for re-evaluation.  Nursing reports no events overnight. The patient has no issues with performing ADLs.  Patient has been medication compliant.    Subjective:  On assessment patient reports "I am okay today". Her only complain is "shooting pain" in hip, states Ibuprofen works. She identifies her mood as "so-so" today, although denies suicidal ideations, homicidal thoughts. She reports somewhat improved sleep on increased dose of Prazosin, no nightmares last night, no side effects from this dose of medication. She denies auditory/visual hallucinations today.  Labs: no new results for review.     Total Time spent with patient: 15 minutes  Past Psychiatric History: see H&P  Past Medical History:  Past Medical History:  Diagnosis Date  . Anxiety   . Asthma   . Depression   . Hypertension   . Scoliosis     Past Surgical History:  Procedure Laterality Date  . BACK SURGERY    . ECTOPIC PREGNANCY SURGERY     Family History:  Family History  Problem Relation Age of Onset  . Hypertension Mother   . Hypertension Father    Family Psychiatric  History: see H&P Social History:  Social History   Substance and Sexual Activity  Alcohol Use Yes   Comment: drinks every other day     Social History   Substance and Sexual Activity  Drug Use Yes  . Types: Marijuana,  Cocaine, "Crack" cocaine   Comment: Currently using crack    Social History   Socioeconomic History  . Marital status: Widowed    Spouse name: Not on file  . Number of children: Not on file  . Years of education: Not on file  . Highest education level: Not on file  Occupational History  . Not on file  Tobacco Use  . Smoking status: Current Every Day Smoker    Packs/day: 0.50    Types: Cigarettes  . Smokeless tobacco: Never Used  Vaping Use  . Vaping Use: Never used  Substance and Sexual Activity  . Alcohol use: Yes    Comment: drinks every other day  . Drug use: Yes    Types: Marijuana, Cocaine, "Crack" cocaine    Comment: Currently using crack  . Sexual activity: Not Currently  Other Topics Concern  . Not on file  Social History Narrative   Pt is homeless, no fixed address; not followed by an outpatient psychiatrist   Social Determinants of Health   Financial Resource Strain: Not on file  Food Insecurity: Not on file  Transportation Needs: Not on file  Physical Activity: Not on file  Stress: Not on file  Social Connections: Not on file   Additional Social History:                         Sleep: Fair  Appetite:  Good  Current Medications: Current Facility-Administered Medications  Medication Dose Route Frequency Provider Last Rate Last Admin  .  acetaminophen (TYLENOL) tablet 650 mg  650 mg Oral Q6H PRN Clapacs, Jackquline Denmark, MD   650 mg at 03/29/20 0756  . alum & mag hydroxide-simeth (MAALOX/MYLANTA) 200-200-20 MG/5ML suspension 30 mL  30 mL Oral Q4H PRN Clapacs, John T, MD      . amLODipine (NORVASC) tablet 10 mg  10 mg Oral Daily Clapacs, Jackquline Denmark, MD   10 mg at 03/29/20 0756  . FLUoxetine (PROZAC) capsule 40 mg  40 mg Oral Daily Clapacs, Jackquline Denmark, MD   40 mg at 03/29/20 0756  . gabapentin (NEURONTIN) capsule 300 mg  300 mg Oral TID Clapacs, John T, MD   300 mg at 03/29/20 1200  . magnesium hydroxide (MILK OF MAGNESIA) suspension 30 mL  30 mL Oral Daily PRN  Clapacs, John T, MD      . prazosin (MINIPRESS) capsule 2 mg  2 mg Oral QHS Jesse Sans, MD   2 mg at 03/28/20 2106  . QUEtiapine (SEROQUEL) tablet 400 mg  400 mg Oral QHS Jesse Sans, MD   400 mg at 03/28/20 2106  . traZODone (DESYREL) tablet 150 mg  150 mg Oral QHS Clapacs, Jackquline Denmark, MD   150 mg at 03/28/20 2106    Lab Results: No results found for this or any previous visit (from the past 48 hour(s)).  Blood Alcohol level:  Lab Results  Component Value Date   ETH <10 03/24/2020   ETH <10 01/22/2020    Metabolic Disorder Labs: Lab Results  Component Value Date   HGBA1C 5.5 01/24/2020   MPG 111.15 01/24/2020   MPG 105.41 12/07/2019   No results found for: PROLACTIN Lab Results  Component Value Date   CHOL 148 01/24/2020   TRIG 72 01/24/2020   HDL 67 01/24/2020   CHOLHDL 2.2 01/24/2020   VLDL 14 01/24/2020   LDLCALC 67 01/24/2020   LDLCALC 83 12/07/2019    Physical Findings: AIMS:  , ,  ,  ,    CIWA:    COWS:     Musculoskeletal: Strength & Muscle Tone: within normal limits Gait & Station: normal Patient leans: N/A   Physical Exam: Physical Exam Vitals reviewed.    ROS Blood pressure 101/80, pulse 95, temperature 98.4 F (36.9 C), temperature source Oral, resp. rate 18, height 5\' 6"  (1.676 m), weight 84.4 kg, last menstrual period 06/19/2018, SpO2 98 %. Body mass index is 30.02 kg/m.   Psychiatric Specialty Exam:  Presentation  General Appearance: Disheveled  Eye Contact: fair  Speech:Normal Rate  Speech Volume:Normal  Handedness:Right   Mood and Affect  Mood:Depressed; "so-so"  Affect:Congruent; Constricted   Thought Process  Thought Processes:Coherent  Descriptions of Associations:Intact  Orientation:Full (Time, Place and Person)  Thought Content:Perseveration  History of Schizophrenia/Schizoaffective disorder:No  Duration of Psychotic Symptoms:Less than six months  Hallucinations: denies today  Ideas  of Reference:None  Suicidal Thoughts: denies today  Homicidal Thoughts:Homicidal Thoughts: No   Sensorium  Memory:Immediate Fair  Judgment:Fair  Insight:Fair   Executive Functions  Concentration:Fair  Attention Span:Fair  Recall:Fair  Fund of Knowledge:Fair  Language:Fair   Psychomotor Activity  Psychomotor Activity:Psychomotor Activity: Decreased   Assets  Assets:Communication Skills; Desire for Improvement   Sleep  Sleep:Sleep: Fair    Treatment Plan Summary: Daily contact with patient to assess and evaluate symptoms and progress in treatment and Medication management  Patient is a 50 year old female/female with the above-stated past psychiatric history who is seen in follow-up.  Chart reviewed. Patient discussed with nursing. Patient reports  partial mood and sleep improvement today. No medication changes made today.   Plan:  -continue inpatient psych admission; 15-minute checks; daily contact with patient to assess and evaluate symptoms and progress in treatment; psychoeducation.  -continue scheduled medications: 1)MDD, recurrent, severe with psychotic features- established problem, unstable  - Continue Seroquel 400 mg nightly for mood and hallucinations, fluoxetine 40 mg daily, trazodone 150 mg QHS for insomnia  2) PTSD, chronic- established problem, unstable  - Prozac as above, continue prazosin 2 mg QHS for nightmares  3) HTN- established problem  - Amlodipine 10 mg daily  4) Neuropathic pain  - Continue gabapentin 300 mg TID   -continue PRN medications.  acetaminophen, alum & mag hydroxide-simeth, magnesium hydroxide  -Pertinent Labs: no new labs ordered today    -Consults: No new consults placed since yesterday    -Disposition: All necessary aftercare will be arranged prior to discharge Likely d/c home with outpatient psych follow-up.  -  I certify that the patient does need, on a daily basis, active treatment furnished  directly by or requiring the supervision of inpatient psychiatric facility personnel.   Thalia Party, MD 03/29/2020, 1:15 PM

## 2020-03-29 NOTE — Progress Notes (Signed)
Patient has been calm and cooperative. Passive SI without plan. Inquiring about housing resources for when she leaves since she is homeless. Contracting for safety

## 2020-03-29 NOTE — Plan of Care (Signed)
  Problem: Education: Goal: Knowledge of [facility] Education information/materials will improve Outcome: Progressing Goal: Emotional status will improve Outcome: Progressing Goal: Mental status will improve Outcome: Progressing Goal: Verbalization of understanding the information provided will improve Outcome: Progressing   Problem: Activity: Goal: Interest or engagement in activities will improve Outcome: Progressing Goal: Sleeping patterns will improve Outcome: Progressing   Problem: Coping: Goal: Ability to verbalize frustrations and anger appropriately will improve Outcome: Progressing Goal: Ability to demonstrate self-control will improve Outcome: Progressing   Problem: Health Behavior/Discharge Planning: Goal: Identification of resources available to assist in meeting health care needs will improve Outcome: Progressing Goal: Compliance with treatment plan for underlying cause of condition will improve Outcome: Progressing   Problem: Physical Regulation: Goal: Ability to maintain clinical measurements within normal limits will improve Outcome: Progressing   Problem: Safety: Goal: Periods of time without injury will increase Outcome: Progressing   Problem: Education: Goal: Utilization of techniques to improve thought processes will improve Outcome: Progressing Goal: Knowledge of the prescribed therapeutic regimen will improve Outcome: Progressing   Problem: Activity: Goal: Interest or engagement in leisure activities will improve Outcome: Progressing Goal: Imbalance in normal sleep/wake cycle will improve Outcome: Progressing   Problem: Coping: Goal: Coping ability will improve Outcome: Progressing Goal: Will verbalize feelings Outcome: Progressing   Problem: Health Behavior/Discharge Planning: Goal: Ability to make decisions will improve Outcome: Progressing Goal: Compliance with therapeutic regimen will improve Outcome: Progressing    Problem: Role Relationship: Goal: Will demonstrate positive changes in social behaviors and relationships Outcome: Progressing   Problem: Safety: Goal: Ability to disclose and discuss suicidal ideas will improve Outcome: Progressing Goal: Ability to identify and utilize support systems that promote safety will improve Outcome: Progressing   Problem: Self-Concept: Goal: Will verbalize positive feelings about self Outcome: Progressing Goal: Level of anxiety will decrease Outcome: Progressing   Problem: Education: Goal: Ability to state activities that reduce stress will improve Outcome: Progressing   Problem: Coping: Goal: Ability to identify and develop effective coping behavior will improve Outcome: Progressing   Problem: Self-Concept: Goal: Ability to identify factors that promote anxiety will improve Outcome: Progressing Goal: Level of anxiety will decrease Outcome: Progressing Goal: Ability to modify response to factors that promote anxiety will improve Outcome: Progressing   Problem: Education: Goal: Ability to make informed decisions regarding treatment will improve Outcome: Progressing   Problem: Coping: Goal: Coping ability will improve Outcome: Progressing   Problem: Health Behavior/Discharge Planning: Goal: Identification of resources available to assist in meeting health care needs will improve Outcome: Progressing   Problem: Medication: Goal: Compliance with prescribed medication regimen will improve Outcome: Progressing   Problem: Self-Concept: Goal: Ability to disclose and discuss suicidal ideas will improve Outcome: Progressing Goal: Will verbalize positive feelings about self Outcome: Progressing   

## 2020-03-29 NOTE — Progress Notes (Signed)
Patient is calm and cooperative. She complains of 8/10 hip pain, which she states has been bad since she arrived to the hospital. Patient denies SI, HI, and AVH. She reports good sleep and appetite. Patient has been isolative to her room but will come out for meals and medications.   Medications given per MD orders. Support and encouragement provided. Patient remains safe on the unit and q15 min safety checks are maintained.

## 2020-03-30 MED ORDER — GABAPENTIN 400 MG PO CAPS
400.0000 mg | ORAL_CAPSULE | Freq: Three times a day (TID) | ORAL | Status: DC
  Administered 2020-03-30 – 2020-04-01 (×5): 400 mg via ORAL
  Filled 2020-03-30 (×5): qty 1

## 2020-03-30 NOTE — Progress Notes (Signed)
[facility] MD Progress Note  03/30/2020 11:39 AM Rachel Nolan  MRN:  [id number]  Principal Problem: Severe recurrent major depression with psychotic features (HCC) Diagnosis: Principal Problem:   Severe recurrent major depression with psychotic features (HCC) Active Problems:   Post traumatic stress disorder (PTSD)   Cocaine use disorder (HCC)   Alcohol use disorder, severe, dependence (HCC)   HTN (hypertension)   Neuropathic pain  Rachel Nolan is a 50y.o. female with major depressive disorder and PTSD presenting for worsening mood, suicidal ideations, and homicidal ideations.   Interval History Patient was seen today for re-evaluation.  Nursing reports no events overnight. The patient has no issues with performing ADLs.  Patient has been medication compliant.    Subjective:  On assessment patient continues to complain about "shooting pain" in left hip that affects her mood and sleep. She identifies her mood as "so-so" again today, she denies active suicidal ideations and homicidal thoughts, reports passive death wishes in settings of life stressors. She reports no recent nightmares; her sleep is not good still due to pain. She denies auditory/visual hallucinations today. Patient is asking to help her with hip pain. We discussed options and she agreed to increase the dose of Gabapentin.   Labs: no new results for review.     Total Time spent with patient: 15 minutes  Past Psychiatric History: see H&P  Past Medical History:  Past Medical History:  Diagnosis Date  . Anxiety   . Asthma   . Depression   . Hypertension   . Scoliosis     Past Surgical History:  Procedure Laterality Date  . BACK SURGERY    . ECTOPIC PREGNANCY SURGERY     Family History:  Family History  Problem Relation Age of Onset  . Hypertension Mother   . Hypertension Father    Family Psychiatric  History: see H&P Social History:  Social History   Substance and Sexual Activity  Alcohol Use Yes    Comment: drinks every other day     Social History   Substance and Sexual Activity  Drug Use Yes  . Types: Marijuana, Cocaine, "Crack" cocaine   Comment: Currently using crack    Social History   Socioeconomic History  . Marital status: Widowed    Spouse name: Not on file  . Number of children: Not on file  . Years of education: Not on file  . Highest education level: Not on file  Occupational History  . Not on file  Tobacco Use  . Smoking status: Current Every Day Smoker    Packs/day: 0.50    Types: Cigarettes  . Smokeless tobacco: Never Used  Vaping Use  . Vaping Use: Never used  Substance and Sexual Activity  . Alcohol use: Yes    Comment: drinks every other day  . Drug use: Yes    Types: Marijuana, Cocaine, "Crack" cocaine    Comment: Currently using crack  . Sexual activity: Not Currently  Other Topics Concern  . Not on file  Social History Narrative   Pt is homeless, no fixed address; not followed by an outpatient psychiatrist   Social Determinants of Health   Financial Resource Strain: Not on file  Food Insecurity: Not on file  Transportation Needs: Not on file  Physical Activity: Not on file  Stress: Not on file  Social Connections: Not on file   Additional Social History:  Sleep: Fair  Appetite:  Good  Current Medications: Current Facility-Administered Medications  Medication Dose Route Frequency Provider Last Rate Last Admin  . acetaminophen (TYLENOL) tablet 650 mg  650 mg Oral Q6H PRN Clapacs, Jackquline Denmark, MD   650 mg at 03/30/20 0823  . albuterol (VENTOLIN HFA) 108 (90 Base) MCG/ACT inhaler 1-2 puff  1-2 puff Inhalation TID PRN Thalia Party, MD      . alum & mag hydroxide-simeth (MAALOX/MYLANTA) 200-200-20 MG/5ML suspension 30 mL  30 mL Oral Q4H PRN Clapacs, John T, MD      . amLODipine (NORVASC) tablet 10 mg  10 mg Oral Daily Clapacs, Jackquline Denmark, MD   10 mg at 03/30/20 0823  . FLUoxetine (PROZAC) capsule 40 mg  40 mg  Oral Daily Clapacs, Jackquline Denmark, MD   40 mg at 03/30/20 0823  . gabapentin (NEURONTIN) capsule 400 mg  400 mg Oral TID Thalia Party, MD      . magnesium hydroxide (MILK OF MAGNESIA) suspension 30 mL  30 mL Oral Daily PRN Clapacs, John T, MD      . prazosin (MINIPRESS) capsule 2 mg  2 mg Oral QHS Jesse Sans, MD   2 mg at 03/29/20 2109  . QUEtiapine (SEROQUEL) tablet 400 mg  400 mg Oral QHS Jesse Sans, MD   400 mg at 03/29/20 2109  . traZODone (DESYREL) tablet 150 mg  150 mg Oral QHS Clapacs, Jackquline Denmark, MD   150 mg at 03/29/20 2108    Lab Results: No results found for this or any previous visit (from the past 48 hour(s)).  Blood Alcohol level:  Lab Results  Component Value Date   ETH <10 03/24/2020   ETH <10 01/22/2020    Metabolic Disorder Labs: Lab Results  Component Value Date   HGBA1C 5.5 01/24/2020   MPG 111.15 01/24/2020   MPG 105.41 12/07/2019   No results found for: PROLACTIN Lab Results  Component Value Date   CHOL 148 01/24/2020   TRIG 72 01/24/2020   HDL 67 01/24/2020   CHOLHDL 2.2 01/24/2020   VLDL 14 01/24/2020   LDLCALC 67 01/24/2020   LDLCALC 83 12/07/2019    Physical Findings: AIMS:  , ,  ,  ,    CIWA:    COWS:     Musculoskeletal: Strength & Muscle Tone: within normal limits Gait & Station: normal Patient leans: N/A   Physical Exam: Physical Exam Vitals reviewed.    ROS  Blood pressure 121/84, pulse 97, temperature 97.9 F (36.6 C), temperature source Oral, resp. rate 18, height 5\' 6"  (1.676 m), weight 84.4 kg, last menstrual period 06/19/2018, SpO2 98 %. Body mass index is 30.02 kg/m.   Psychiatric Specialty Exam:  Presentation  General Appearance: Disheveled  Eye Contact: fair  Speech:Normal Rate  Speech Volume:Normal  Handedness:Right   Mood and Affect  Mood:Depressed; "so-so"  Affect:Congruent; Constricted   Thought Process  Thought Processes:Coherent  Descriptions of  Associations:Intact  Orientation:Full (Time, Place and Person)  Thought Content:Perseveration  History of Schizophrenia/Schizoaffective disorder:No  Duration of Psychotic Symptoms:Less than six months  Hallucinations: denies today  Ideas of Reference:None  Suicidal Thoughts: denies today  Homicidal Thoughts:Homicidal Thoughts: No   Sensorium  Memory:Immediate Fair  Judgment:Fair  Insight:Fair   Executive Functions  Concentration:Fair  Attention Span:Fair  Recall:Fair  Fund of Knowledge:Fair  Language:Fair   Psychomotor Activity  Psychomotor Activity:Psychomotor Activity: Decreased   Assets  Assets:Communication Skills; Desire for Improvement   Sleep  Sleep:Sleep: Fair  Treatment Plan Summary: Daily contact with patient to assess and evaluate symptoms and progress in treatment and Medication management  Patient is a 50 year old female/female with the above-stated past psychiatric history who is seen in follow-up.  Chart reviewed. Patient discussed with nursing. Patient reports partial mood and sleep improvement today. Patient continues to complain about left hip pain - will increase the doser of Gabapentin today. No other medication changes made today.   Plan:  -continue inpatient psych admission; 15-minute checks; daily contact with patient to assess and evaluate symptoms and progress in treatment; psychoeducation.  -continue scheduled medications: 1)MDD, recurrent, severe with psychotic features- established problem, unstable  - Continue Seroquel 400 mg nightly for mood and hallucinations, fluoxetine 40 mg daily, trazodone 150 mg QHS for insomnia  2) PTSD, chronic- established problem, unstable  - Prozac as above, continue prazosin 2 mg QHS for nightmares  3) HTN- established problem  - Amlodipine 10 mg daily  4) Neuropathic pain  - increase gabapentin to 400 mg TID   -continue PRN medications.  acetaminophen,  albuterol, alum & mag hydroxide-simeth, magnesium hydroxide  -Pertinent Labs: no new labs ordered today    -Consults: No new consults placed since yesterday    -Disposition: All necessary aftercare will be arranged prior to discharge Likely d/c home with outpatient psych follow-up.  -  I certify that the patient does need, on a daily basis, active treatment furnished directly by or requiring the supervision of inpatient psychiatric facility personnel.   Thalia Party, MD 03/30/2020, 11:39 AM

## 2020-03-30 NOTE — Plan of Care (Signed)
  Problem: Education: Goal: Knowledge of [facility] Education information/materials will improve Outcome: Progressing Goal: Emotional status will improve Outcome: Progressing Goal: Mental status will improve Outcome: Progressing Goal: Verbalization of understanding the information provided will improve Outcome: Progressing   Problem: Activity: Goal: Interest or engagement in activities will improve Outcome: Progressing Goal: Sleeping patterns will improve Outcome: Progressing   Problem: Coping: Goal: Ability to verbalize frustrations and anger appropriately will improve Outcome: Progressing Goal: Ability to demonstrate self-control will improve Outcome: Progressing   Problem: Health Behavior/Discharge Planning: Goal: Identification of resources available to assist in meeting health care needs will improve Outcome: Progressing Goal: Compliance with treatment plan for underlying cause of condition will improve Outcome: Progressing   Problem: Physical Regulation: Goal: Ability to maintain clinical measurements within normal limits will improve Outcome: Progressing   Problem: Safety: Goal: Periods of time without injury will increase Outcome: Progressing   Problem: Education: Goal: Utilization of techniques to improve thought processes will improve Outcome: Progressing Goal: Knowledge of the prescribed therapeutic regimen will improve Outcome: Progressing   Problem: Activity: Goal: Interest or engagement in leisure activities will improve Outcome: Progressing Goal: Imbalance in normal sleep/wake cycle will improve Outcome: Progressing   Problem: Coping: Goal: Coping ability will improve Outcome: Progressing Goal: Will verbalize feelings Outcome: Progressing   Problem: Health Behavior/Discharge Planning: Goal: Ability to make decisions will improve Outcome: Progressing Goal: Compliance with therapeutic regimen will improve Outcome: Progressing    Problem: Role Relationship: Goal: Will demonstrate positive changes in social behaviors and relationships Outcome: Progressing   Problem: Safety: Goal: Ability to disclose and discuss suicidal ideas will improve Outcome: Progressing Goal: Ability to identify and utilize support systems that promote safety will improve Outcome: Progressing   Problem: Self-Concept: Goal: Will verbalize positive feelings about self Outcome: Progressing Goal: Level of anxiety will decrease Outcome: Progressing   Problem: Education: Goal: Ability to state activities that reduce stress will improve Outcome: Progressing   Problem: Coping: Goal: Ability to identify and develop effective coping behavior will improve Outcome: Progressing   Problem: Self-Concept: Goal: Ability to identify factors that promote anxiety will improve Outcome: Progressing Goal: Level of anxiety will decrease Outcome: Progressing Goal: Ability to modify response to factors that promote anxiety will improve Outcome: Progressing   Problem: Education: Goal: Ability to make informed decisions regarding treatment will improve Outcome: Progressing   Problem: Coping: Goal: Coping ability will improve Outcome: Progressing   Problem: Health Behavior/Discharge Planning: Goal: Identification of resources available to assist in meeting health care needs will improve Outcome: Progressing   Problem: Medication: Goal: Compliance with prescribed medication regimen will improve Outcome: Progressing   Problem: Self-Concept: Goal: Ability to disclose and discuss suicidal ideas will improve Outcome: Progressing Goal: Will verbalize positive feelings about self Outcome: Progressing   

## 2020-03-30 NOTE — [facility] Group Notes (Signed)
[facility] LCSW Group Therapy Note  Date/Time: 03/30/2020 @ 1pm  Type of Therapy and Topic:  Group Therapy:  Overcoming Obstacles  Participation Level:  [facility] PARTICIPATION LEVEL: Did Not Attend  Description of Group:    In this group patients will be encouraged to explore what they see as obstacles to their own wellness and recovery. They will be guided to discuss their thoughts, feelings, and behaviors related to these obstacles. The group will process together ways to cope with barriers, with attention given to specific choices patients can make. Each patient will be challenged to identify changes they are motivated to make in order to overcome their obstacles. This group will be process-oriented, with patients participating in exploration of their own experiences as well as giving and receiving support and challenge from other group members.  Therapeutic Goals: 1. Patient will identify personal and current obstacles as they relate to admission. 2. Patient will identify barriers that currently interfere with their wellness or overcoming obstacles.  3. Patient will identify feelings, thought process and behaviors related to these barriers. 4. Patient will identify two changes they are willing to make to overcome these obstacles:    Summary of Patient Progress   Patient did not attend group therapy today.    Therapeutic Modalities:   Cognitive Behavioral Therapy Solution Focused Therapy Motivational Interviewing Relapse Prevention Therapy   Kyra Laffey, LCSW  

## 2020-03-30 NOTE — Progress Notes (Signed)
Patient is calm and cooperative. She states she did not sleep well and was tossing and turning in the middle of the night but was in bed for 9.5 hours per documented sleep hours. Her main complaint was pain in her left hip, which she rates as an 8/10. She describes the pain as shooting down her leg. Patient endorsed SI thoughts earlier in the morning but denies them at the time of assessment and contracts for safety. She denies HI and AVH.  Medications were given per MD orders. Support and encouragement provided. Patient remains safe at this time and q15 minute safety checks are maintained.

## 2020-03-30 NOTE — Progress Notes (Signed)
Patient has been pleasant and cooperative. Still has some suicidal ideation but is able to contract for safety

## 2020-03-31 MED ORDER — FLUOXETINE HCL 20 MG PO CAPS
60.0000 mg | ORAL_CAPSULE | Freq: Every day | ORAL | Status: DC
  Administered 2020-04-01 – 2020-04-11 (×11): 60 mg via ORAL
  Filled 2020-03-31 (×11): qty 3

## 2020-03-31 MED ORDER — PRAZOSIN HCL 2 MG PO CAPS
3.0000 mg | ORAL_CAPSULE | Freq: Every day | ORAL | Status: DC
  Administered 2020-03-31 – 2020-04-10 (×11): 3 mg via ORAL
  Filled 2020-03-31 (×11): qty 1

## 2020-03-31 NOTE — Progress Notes (Signed)
Physical Therapy Treatment Patient Details Name: Rachel Nolan MRN: [id number] DOB: 1970-05-16 Today's Date: 03/31/2020    History of Present Illness Pt is a 50 year old female with major depressive disorder and PTSD presenting for worsening mood, suicidal ideations, and homicidal ideations.  PMH includes HTN, neuropathic pain, scoliosis, and back surgery.    PT Comments    Pt limited by LLE/hip pain this session that she stated started when her foot slipped yesterday in the shower.  Pt reported that she did not fall when her foot slipped.  Pt declined standing activity this session secondary to increased L hip and lower leg pain in standing position.  Pt assisted with sidelying positioning with pillow between the knees for pain control and reported significant decrease in pain in that position.  Pt education provided on low intensity HEP to perform to her tolerance per below.  Pt will benefit from HHPT services upon discharge to safely address deficits listed in patient problem list for decreased caregiver assistance and eventual return to PLOF.    Follow Up Recommendations  Home health PT;Supervision - Intermittent     Equipment Recommendations  None recommended by PT    Recommendations for Other Services       Precautions / Restrictions Precautions Precautions: None Restrictions Weight Bearing Restrictions: No    Mobility  Bed Mobility Overal bed mobility: Independent             General bed mobility comments: Ind with sup to/from sit    Transfers                 General transfer comment: declined secondary to LLE pain  Ambulation/Gait                 Stairs             Wheelchair Mobility    Modified Rankin (Stroke Patients Only)       Balance                                            Cognition Arousal/Alertness: Awake/alert Behavior During Therapy: WFL for tasks assessed/performed Overall Cognitive Status:  Within Functional Limits for tasks assessed                                        Exercises Total Joint Exercises Ankle Circles/Pumps: AROM;Strengthening;Both;5 reps;10 reps Quad Sets: Strengthening;10 reps;Both Gluteal Sets: Strengthening;Both;10 reps Heel Slides: AROM;Both;5 reps;Strengthening Long Arc Quad: AROM;Strengthening;Both;10 reps Other Exercises Other Exercises: Pt positioning education to address L hip/LLE pain Other Exercises: HEP education for BLE APs, QS, GS, and LAQs x 10 each every 1-2 hours daily to tolerance    General Comments        Pertinent Vitals/Pain Pain Assessment: 0-10 Pain Score: 9  Pain Location: Chronic back pain, LLE pain Pain Descriptors / Indicators: Sore;Sharp Pain Intervention(s): Repositioned;Monitored during session;Premedicated before session    Home Living                      Prior Function            PT Goals (current goals can now be found in the care plan section) Progress towards PT goals: Not progressing toward goals - comment (Limited by LLE pain)    Frequency  Min 2X/week      PT Plan Current plan remains appropriate    Co-evaluation              AM-PAC PT "6 Clicks" Mobility   Outcome Measure  Help needed turning from your back to your side while in a flat bed without using bedrails?: None Help needed moving from lying on your back to sitting on the side of a flat bed without using bedrails?: None Help needed moving to and from a bed to a chair (including a wheelchair)?: None Help needed standing up from a chair using your arms (e.g., wheelchair or bedside chair)?: None Help needed to walk in hospital room?: A Little Help needed climbing 3-5 steps with a railing? : A Little 6 Click Score: 22    End of Session   Activity Tolerance: Patient limited by pain Patient left: in bed Nurse Communication: Mobility status;Other (comment) (Pt desires a back brace, MD notified) PT  Visit Diagnosis: Difficulty in walking, not elsewhere classified (R26.2);Muscle weakness (generalized) (M62.81);Pain Pain - Right/Left: Left Pain - part of body: Hip;Leg     Time: 7673-4193 PT Time Calculation (min) (ACUTE ONLY): 11 min  Charges:  $Therapeutic Exercise: 8-22 mins                     D. Elly Modena PT, DPT 03/31/20, 3:40 PM

## 2020-03-31 NOTE — Progress Notes (Signed)
Appears brighter. Pleasant. Still has some intermittent SI but contracts for safety
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
## 2020-03-31 NOTE — Plan of Care (Signed)
  Problem: Education: Goal: Knowledge of [facility] Education information/materials will improve Outcome: Progressing Goal: Emotional status will improve Outcome: Progressing Goal: Mental status will improve Outcome: Progressing Goal: Verbalization of understanding the information provided will improve Outcome: Progressing   Problem: Activity: Goal: Interest or engagement in activities will improve Outcome: Progressing Goal: Sleeping patterns will improve Outcome: Progressing   Problem: Coping: Goal: Ability to verbalize frustrations and anger appropriately will improve Outcome: Progressing Goal: Ability to demonstrate self-control will improve Outcome: Progressing   Problem: Health Behavior/Discharge Planning: Goal: Identification of resources available to assist in meeting health care needs will improve Outcome: Progressing Goal: Compliance with treatment plan for underlying cause of condition will improve Outcome: Progressing   Problem: Physical Regulation: Goal: Ability to maintain clinical measurements within normal limits will improve Outcome: Progressing   Problem: Safety: Goal: Periods of time without injury will increase Outcome: Progressing   Problem: Education: Goal: Utilization of techniques to improve thought processes will improve Outcome: Progressing Goal: Knowledge of the prescribed therapeutic regimen will improve Outcome: Progressing   Problem: Activity: Goal: Interest or engagement in leisure activities will improve Outcome: Progressing Goal: Imbalance in normal sleep/wake cycle will improve Outcome: Progressing   Problem: Coping: Goal: Coping ability will improve Outcome: Progressing Goal: Will verbalize feelings Outcome: Progressing   Problem: Health Behavior/Discharge Planning: Goal: Ability to make decisions will improve Outcome: Progressing Goal: Compliance with therapeutic regimen will improve Outcome: Progressing    Problem: Role Relationship: Goal: Will demonstrate positive changes in social behaviors and relationships Outcome: Progressing   Problem: Safety: Goal: Ability to disclose and discuss suicidal ideas will improve Outcome: Progressing Goal: Ability to identify and utilize support systems that promote safety will improve Outcome: Progressing   Problem: Self-Concept: Goal: Will verbalize positive feelings about self Outcome: Progressing Goal: Level of anxiety will decrease Outcome: Progressing   Problem: Education: Goal: Ability to state activities that reduce stress will improve Outcome: Progressing   Problem: Coping: Goal: Ability to identify and develop effective coping behavior will improve Outcome: Progressing   Problem: Self-Concept: Goal: Ability to identify factors that promote anxiety will improve Outcome: Progressing Goal: Level of anxiety will decrease Outcome: Progressing Goal: Ability to modify response to factors that promote anxiety will improve Outcome: Progressing   Problem: Education: Goal: Ability to make informed decisions regarding treatment will improve Outcome: Progressing   Problem: Coping: Goal: Coping ability will improve Outcome: Progressing   Problem: Health Behavior/Discharge Planning: Goal: Identification of resources available to assist in meeting health care needs will improve Outcome: Progressing   Problem: Medication: Goal: Compliance with prescribed medication regimen will improve Outcome: Progressing   Problem: Self-Concept: Goal: Ability to disclose and discuss suicidal ideas will improve Outcome: Progressing Goal: Will verbalize positive feelings about self Outcome: Progressing   

## 2020-03-31 NOTE — [facility] Group Notes (Signed)
LCSW Group Therapy Note   03/31/2020 3:12 PM  Type of Therapy and Topic:  Group Therapy:  Overcoming Obstacles   Participation Level:  Did Not Attend   Description of Group:    In this group patients will be encouraged to explore what they see as obstacles to their own wellness and recovery. They will be guided to discuss their thoughts, feelings, and behaviors related to these obstacles. The group will process together ways to cope with barriers, with attention given to specific choices patients can make. Each patient will be challenged to identify changes they are motivated to make in order to overcome their obstacles. This group will be process-oriented, with patients participating in exploration of their own experiences as well as giving and receiving support and challenge from other group members.   Therapeutic Goals: 1. Patient will identify personal and current obstacles as they relate to admission. 2. Patient will identify barriers that currently interfere with their wellness or overcoming obstacles.  3. Patient will identify feelings, thought process and behaviors related to these barriers. 4. Patient will identify two changes they are willing to make to overcome these obstacles:      Summary of Patient Progress X   Therapeutic Modalities:   Cognitive Behavioral Therapy Solution Focused Therapy Motivational Interviewing Relapse Prevention Therapy  Penni Homans, MSW, LCSW 03/31/2020 3:12 PM

## 2020-03-31 NOTE — Progress Notes (Signed)
Recreation Therapy Notes  Date: 03/31/2020  Time: 9:30 am   Location: Craft room    Behavioral response: N/A   Intervention Topic: Time Management   Discussion/Intervention: Patient did not attend group.   Clinical Observations/Feedback:  Patient did not attend group.   Sumiya Mamaril LRT/CTRS          Deniesha Stenglein 03/31/2020 11:33 AM

## 2020-03-31 NOTE — Progress Notes (Signed)
The patient has been med compliant during this shift. She was pleasant during med pass, and communicated that she slept well and did not have any thoughts of SI/HI. She did not have any AVH. She received trazadone to help her sleep last night. She rated her depression 8/10 and hopelessness 7/10 and 0/10 for anxiety. She did have complaint of lower back pain, but, did not want any pain medications. Will continue to monitor and provide support to the patient until discharge.

## 2020-03-31 NOTE — Tx Team (Addendum)
Interdisciplinary Treatment and Diagnostic Plan Update  03/31/2020 Time of Session: 8:30AM Rachel Nolan MRN: [id number]  Principal Diagnosis: Severe recurrent major depression with psychotic features [facility])  Secondary Diagnoses: Principal Problem:   Severe recurrent major depression with psychotic features (HCC) Active Problems:   Post traumatic stress disorder (PTSD)   Cocaine use disorder (HCC)   Alcohol use disorder, severe, dependence (HCC)   HTN (hypertension)   Neuropathic pain   Current Medications:  Current Facility-Administered Medications  Medication Dose Route Frequency Provider Last Rate Last Admin  . acetaminophen (TYLENOL) tablet 650 mg  650 mg Oral Q6H PRN Clapacs, John T, MD   650 mg at 03/30/20 1632  . albuterol (VENTOLIN HFA) 108 (90 Base) MCG/ACT inhaler 1-2 puff  1-2 puff Inhalation TID PRN Thalia Party, MD      . alum & mag hydroxide-simeth (MAALOX/MYLANTA) 200-200-20 MG/5ML suspension 30 mL  30 mL Oral Q4H PRN Clapacs, John T, MD      . amLODipine (NORVASC) tablet 10 mg  10 mg Oral Daily Clapacs, Jackquline Denmark, MD   10 mg at 03/31/20 0813  . FLUoxetine (PROZAC) capsule 40 mg  40 mg Oral Daily Clapacs, Jackquline Denmark, MD   40 mg at 03/31/20 0813  . gabapentin (NEURONTIN) capsule 400 mg  400 mg Oral TID Thalia Party, MD   400 mg at 03/31/20 0813  . magnesium hydroxide (MILK OF MAGNESIA) suspension 30 mL  30 mL Oral Daily PRN Clapacs, John T, MD      . prazosin (MINIPRESS) capsule 2 mg  2 mg Oral QHS Jesse Sans, MD   2 mg at 03/30/20 2110  . QUEtiapine (SEROQUEL) tablet 400 mg  400 mg Oral QHS Jesse Sans, MD   400 mg at 03/30/20 2110  . traZODone (DESYREL) tablet 150 mg  150 mg Oral QHS Clapacs, Jackquline Denmark, MD   150 mg at 03/30/20 2110   PTA Medications: Medications Prior to Admission  Medication Sig Dispense Refill Last Dose  . amLODipine (NORVASC) 10 MG tablet Take 1 tablet (10 mg total) by mouth daily. 30 tablet 1   . FLUoxetine (PROZAC) 40 MG capsule Take 1  capsule (40 mg total) by mouth daily. 30 capsule 1   . gabapentin (NEURONTIN) 300 MG capsule Take 1 capsule (300 mg total) by mouth 3 (three) times daily. 90 capsule 1   . prazosin (MINIPRESS) 1 MG capsule Take 3 capsules (3 mg total) by mouth at bedtime. 30 capsule 1   . QUEtiapine (SEROQUEL) 300 MG tablet Take 1 tablet (300 mg total) by mouth at bedtime. 30 tablet 1   . traZODone (DESYREL) 150 MG tablet Take 1 tablet (150 mg total) by mouth at bedtime as needed for sleep. 30 tablet 1     Patient Stressors: Financial difficulties Marital or family conflict Medication change or noncompliance  Patient Strengths: Ability for insight Average or above average intelligence Communication skills General fund of knowledge  Treatment Modalities: Medication Management, Group therapy, Case management,  1 to 1 session with clinician, Psychoeducation, Recreational therapy.   Physician Treatment Plan for Primary Diagnosis: Severe recurrent major depression with psychotic features (HCC) Long Term Goal(s): Improvement in symptoms so as ready for discharge Improvement in symptoms so as ready for discharge   Short Term Goals: Ability to identify changes in lifestyle to reduce recurrence of condition will improve Ability to verbalize feelings will improve Ability to disclose and discuss suicidal ideas Ability to demonstrate self-control will improve Ability to identify and  develop effective coping behaviors will improve Ability to maintain clinical measurements within normal limits will improve Compliance with prescribed medications will improve Ability to identify triggers associated with substance abuse/mental health issues will improve Ability to identify changes in lifestyle to reduce recurrence of condition will improve Ability to verbalize feelings will improve Ability to disclose and discuss suicidal ideas Ability to demonstrate self-control will improve Ability to identify and develop  effective coping behaviors will improve Ability to maintain clinical measurements within normal limits will improve Compliance with prescribed medications will improve Ability to identify triggers associated with substance abuse/mental health issues will improve  Medication Management: Evaluate patient's response, side effects, and tolerance of medication regimen.  Therapeutic Interventions: 1 to 1 sessions, Unit Group sessions and Medication administration.  Evaluation of Outcomes: Progressing  Physician Treatment Plan for Secondary Diagnosis: Principal Problem:   Severe recurrent major depression with psychotic features (HCC) Active Problems:   Post traumatic stress disorder (PTSD)   Cocaine use disorder (HCC)   Alcohol use disorder, severe, dependence (HCC)   HTN (hypertension)   Neuropathic pain  Long Term Goal(s): Improvement in symptoms so as ready for discharge Improvement in symptoms so as ready for discharge   Short Term Goals: Ability to identify changes in lifestyle to reduce recurrence of condition will improve Ability to verbalize feelings will improve Ability to disclose and discuss suicidal ideas Ability to demonstrate self-control will improve Ability to identify and develop effective coping behaviors will improve Ability to maintain clinical measurements within normal limits will improve Compliance with prescribed medications will improve Ability to identify triggers associated with substance abuse/mental health issues will improve Ability to identify changes in lifestyle to reduce recurrence of condition will improve Ability to verbalize feelings will improve Ability to disclose and discuss suicidal ideas Ability to demonstrate self-control will improve Ability to identify and develop effective coping behaviors will improve Ability to maintain clinical measurements within normal limits will improve Compliance with prescribed medications will improve Ability to  identify triggers associated with substance abuse/mental health issues will improve     Medication Management: Evaluate patient's response, side effects, and tolerance of medication regimen.  Therapeutic Interventions: 1 to 1 sessions, Unit Group sessions and Medication administration.  Evaluation of Outcomes: Progressing   RN Treatment Plan for Primary Diagnosis: Severe recurrent major depression with psychotic features (HCC) Long Term Goal(s): Knowledge of disease and therapeutic regimen to maintain health will improve  Short Term Goals: Ability to remain free from injury will improve, Ability to verbalize frustration and anger appropriately will improve, Ability to demonstrate self-control, Ability to participate in decision making will improve, Ability to verbalize feelings will improve, Ability to disclose and discuss suicidal ideas, Ability to identify and develop effective coping behaviors will improve and Compliance with prescribed medications will improve  Medication Management: RN will administer medications as ordered by provider, will assess and evaluate patient's response and provide education to patient for prescribed medication. RN will report any adverse and/or side effects to prescribing provider.  Therapeutic Interventions: 1 on 1 counseling sessions, Psychoeducation, Medication administration, Evaluate responses to treatment, Monitor vital signs and CBGs as ordered, Perform/monitor CIWA, COWS, AIMS and Fall Risk screenings as ordered, Perform wound care treatments as ordered.  Evaluation of Outcomes: Progressing   LCSW Treatment Plan for Primary Diagnosis: Severe recurrent major depression with psychotic features (HCC) Long Term Goal(s): Safe transition to appropriate next level of care at discharge, Engage patient in therapeutic group addressing interpersonal concerns.  Short Term Goals: Engage  patient in aftercare planning with referrals and resources, Increase social  support, Increase ability to appropriately verbalize feelings, Increase emotional regulation, Facilitate patient progression through stages of change regarding substance use diagnoses and concerns, Identify triggers associated with mental health/substance abuse issues and Increase skills for wellness and recovery  Therapeutic Interventions: Assess for all discharge needs, 1 to 1 time with Social worker, Explore available resources and support systems, Assess for adequacy in community support network, Educate family and significant other(s) on suicide prevention, Complete Psychosocial Assessment, Interpersonal group therapy.  Evaluation of Outcomes: Progressing   Progress in Treatment: Attending groups: No. Participating in groups: No. Taking medication as prescribed: Yes. Toleration medication: Yes. Family/Significant other contact made: No, will contact:  pt declined Patient understands diagnosis: Yes. Discussing patient identified problems/goals with staff: Yes. Medical problems stabilized or resolved: Yes. Denies suicidal/homicidal ideation: Yes. Issues/concerns per patient self-inventory: No. Other: None  New problem(s) identified: No, Describe:  None  New Short Term/Long Term Goal(s):  Engage patient in aftercare planning with referrals and resources, Increase social support, Increase ability to appropriately verbalize feelings, Increase emotional regulation, Facilitate patient progression through stages of change regarding substance use diagnoses and concerns, Identify triggers associated with mental health/substance abuse issues and Increase skills for wellness and recovery Update 03/31/20: No changes identified  Update 04/10/20: No changes identified  Patient Goals:   "Not wanting to hear far off voices anymore." Pt stated that he also wants to get back on medications and improve mood. Update 03/31/20: No changes identified  Update 04/10/20: No changes identified    Discharge Plan  or Barriers: CSW will assist pt with procuring follow-up care and transportation home upon discharge.  Update 03/31/20: No changes identified Update 04/10/20: CSW is attempting to procure placement in a homeless shelter for pt. She cannot return home and the inpatient substance tx facilities denied her acceptance.   Reason for Continuation of Hospitalization: Medical Issues Medication stabilization    Reason for Continuation of Hospitalization: Depression Medical Issues Medication stabilization  Estimated Length of Stay: 1-7 days    Attendees: Patient:  03/31/2020 9:41 AM  Physician: Jesse Sans, MD 03/31/2020 9:41 AM  Nursing:  03/31/2020 9:41 AM  RN Care Manager: 03/31/2020 9:41 AM  Social Worker: Penni Homans, MSW, LCSW 03/31/2020 9:41 AM  Recreational Therapist:   03/31/2020 9:41 AM  Other: Jillyn Hidden, MSW, LCSW, LCAS 03/31/2020 9:41 AM  Other:Rachel Nolan, MSW, LCSW-A  03/31/2020 9:41 AM  Other: 03/31/2020 9:41 AM    Scribe for Treatment Team: Cathi Hazan A Nolan, LCSWA 03/31/2020 9:41 AM

## 2020-03-31 NOTE — Plan of Care (Signed)
?  Problem: Education: ?Goal: Emotional status will improve ?Outcome: Progressing ?Goal: Mental status will improve ?Outcome: Progressing ?Goal: Verbalization of understanding the information provided will improve ?Outcome: Progressing ?  ?Problem: Activity: ?Goal: Sleeping patterns will improve ?Outcome: Progressing ?  ?Problem: Coping: ?Goal: Ability to demonstrate self-control will improve ?Outcome: Progressing ?  ?

## 2020-04-01 MED ORDER — GABAPENTIN 300 MG PO CAPS
600.0000 mg | ORAL_CAPSULE | Freq: Three times a day (TID) | ORAL | Status: DC
  Administered 2020-04-01 – 2020-04-11 (×31): 600 mg via ORAL
  Filled 2020-04-01 (×18): qty 2
  Filled 2020-04-01: qty 6
  Filled 2020-04-01 (×12): qty 2

## 2020-04-01 NOTE — [facility] Group Notes (Signed)
[facility] Group Notes:  (Nursing/MHT/Case Management/Adjunct)  Date:  04/01/2020  Time:  09:30 AM  Type of Therapy:  Psychoeducational Skills  Participation Level:  Did Not Attend  Summary of Progress/Problems:  Darrin Apodaca A Dashanti Burr 04/01/2020, 10:51 AM

## 2020-04-01 NOTE — Progress Notes (Signed)
Patient pleasant and cooperative with care. Currently denies SI, HI, AVH  but endorses depression. Patient noted smiling with staff and minimal interaction with peers. Pt out for snacks and meds. Medication given for sleep with good relief. Encouragement and support provided. Safety checks maintained. Medications given as prescribed. Pt receptive and remains safe on unit with q 15 min checks.

## 2020-04-01 NOTE — [facility] Counselor (Signed)
CSW met with pt to discuss residential/inpt substance abuse treatment referrals. CSW inquired if pt was interested in other programs outside of [facility]. She stated that she was not. Pt and CSW completed referral paperwork. No other concerns expressed. Contact ended without incident.   CSW emailed referral packet to [employer] at [email or web address].   CSW will continue to follow regarding referral.   Hedy Camara R. Guerry Bruin, MSW, [place], [place] 04/01/2020 12:42 PM

## 2020-04-01 NOTE — Progress Notes (Signed)
Recreation Therapy Notes  Date: 04/01/2020  Time: 10:00 am   Location: Craft room    Behavioral response: N/A   Intervention Topic: Relaxation   Discussion/Intervention: Patient did not attend group.   Clinical Observations/Feedback:  Patient did not attend group.   Shaverence Outlaw LRT/CTRS        Shaverence  Outlaw 04/01/2020 10:55 AM

## 2020-04-01 NOTE — BH Assessment (Signed)
CSW has contacted [employer] on 03/31/2020 and 04/01/2020.  CSW left HIPAA compliant voicemails both times.  No call back at this time.  Penni Homans, MSW, LCSW 04/01/2020 11:01 AM

## 2020-04-01 NOTE — [facility] Group Notes (Signed)
LCSW Group Therapy Note  04/01/2020 1:43 PM  Type of Therapy/Topic:  Group Therapy:  Feelings about Diagnosis  Participation Level:  Did Not Attend   Description of Group:   This group will allow patients to explore their thoughts and feelings about diagnoses they have received. Patients will be guided to explore their level of understanding and acceptance of these diagnoses. Facilitator will encourage patients to process their thoughts and feelings about the reactions of others to their diagnosis and will guide patients in identifying ways to discuss their diagnosis with significant others in their lives. This group will be process-oriented, with patients participating in exploration of their own experiences, giving and receiving support, and processing challenge from other group members.   Therapeutic Goals: 1. Patient will demonstrate understanding of diagnosis as evidenced by identifying two or more symptoms of the disorder 2. Patient will be able to express two feelings regarding the diagnosis 3. Patient will demonstrate their ability to communicate their needs through discussion and/or role play  Summary of Patient Progress: X  Therapeutic Modalities:   Cognitive Behavioral Therapy Brief Therapy Feelings Identification   Rachel Nolan R. Algis Greenhouse, MSW, LCSW, LCAS 04/01/2020 1:43 PM

## 2020-04-01 NOTE — Plan of Care (Signed)
D: Patient denies SI/HI/AVH. Patient presents with good during assessment. Patient reports chronic pain to left hip. Patient reports that she wants to rest now.  A: Patient was assessed by this nurse.  Q x 15 minute observation checks were completed for safety. Patient was provided with verbal education on provided medications. Patient care plan was reviewed. Patient was offered support and encouragement. Patient was encourage to attend groups, participate in unit activities and continue with plan of care.    R: Patient adheres with scheduled medication. Patient is receptive to treatment and safety maintained on unit.     Problem: Education: Goal: Knowledge of [place] [facility] Education information/materials will improve Outcome: Not Progressing Goal: Emotional status will improve Outcome: Not Progressing Goal: Mental status will improve Outcome: Not Progressing

## 2020-04-01 NOTE — Progress Notes (Signed)
[facility] MD Progress Note  04/01/2020 10:47 AM Rachel Nolan  MRN:  [id number]  Principal Problem: Severe recurrent major depression with psychotic features (HCC) Diagnosis: Principal Problem:   Severe recurrent major depression with psychotic features (HCC) Active Problems:   Post traumatic stress disorder (PTSD)   Cocaine use disorder (HCC)   Alcohol use disorder, severe, dependence (HCC)   HTN (hypertension)   Neuropathic pain  CC "My hip is still killing me."  Rachel Nolan is a 50y.o. female with major depressive disorder and PTSD presenting for worsening mood, suicidal ideations, and homicidal ideations.   Interval History Patient was seen today for re-evaluation.  Nursing reports no events overnight. The patient has no issues with performing ADLs.  Patient has been medication compliant.    Subjective:  On assessment patient continues to endorse pain radiating from her back to her left hip. She notes that lying down with a pillow placed between her legs helps alleviate the pain. Otherwise, patient notes that she is doing well. She denies any suicidal ideations, homicidal ideations, visual hallucinations, or auditory hallucinations. She also denies nightmares overnight. She continues to express interest in long-term substance abuse treatment for cocaine and alcohol use disorder.   Labs: no new results for review.  Total Time spent with patient: 20 minutes  Past Psychiatric History: see H&P  Past Medical History:  Past Medical History:  Diagnosis Date  . Anxiety   . Asthma   . Depression   . Hypertension   . Scoliosis     Past Surgical History:  Procedure Laterality Date  . BACK SURGERY    . ECTOPIC PREGNANCY SURGERY     Family History:  Family History  Problem Relation Age of Onset  . Hypertension Mother   . Hypertension Father    Family Psychiatric  History: see H&P Social History:  Social History   Substance and Sexual Activity  Alcohol Use Yes   Comment:  drinks every other day     Social History   Substance and Sexual Activity  Drug Use Yes  . Types: Marijuana, Cocaine, "Crack" cocaine   Comment: Currently using crack    Social History   Socioeconomic History  . Marital status: Widowed    Spouse name: Not on file  . Number of children: Not on file  . Years of education: Not on file  . Highest education level: Not on file  Occupational History  . Not on file  Tobacco Use  . Smoking status: Current Every Day Smoker    Packs/day: 0.50    Types: Cigarettes  . Smokeless tobacco: Never Used  Vaping Use  . Vaping Use: Never used  Substance and Sexual Activity  . Alcohol use: Yes    Comment: drinks every other day  . Drug use: Yes    Types: Marijuana, Cocaine, "Crack" cocaine    Comment: Currently using crack  . Sexual activity: Not Currently  Other Topics Concern  . Not on file  Social History Narrative   Pt is homeless, no fixed address; not followed by an outpatient psychiatrist   Social Determinants of Health   Financial Resource Strain: Not on file  Food Insecurity: Not on file  Transportation Needs: Not on file  Physical Activity: Not on file  Stress: Not on file  Social Connections: Not on file   Additional Social History:                         Sleep: Fair  Appetite:  Good  Current Medications: Current Facility-Administered Medications  Medication Dose Route Frequency Provider Last Rate Last Admin  . acetaminophen (TYLENOL) tablet 650 mg  650 mg Oral Q6H PRN Clapacs, Jackquline Denmark, MD   650 mg at 04/01/20 0806  . albuterol (VENTOLIN HFA) 108 (90 Base) MCG/ACT inhaler 1-2 puff  1-2 puff Inhalation TID PRN Thalia Party, MD   2 puff at 03/31/20 1646  . alum & mag hydroxide-simeth (MAALOX/MYLANTA) 200-200-20 MG/5ML suspension 30 mL  30 mL Oral Q4H PRN Clapacs, John T, MD      . amLODipine (NORVASC) tablet 10 mg  10 mg Oral Daily Clapacs, Jackquline Denmark, MD   10 mg at 04/01/20 0752  . FLUoxetine (PROZAC) capsule 60  mg  60 mg Oral Daily Jesse Sans, MD   60 mg at 04/01/20 0752  . gabapentin (NEURONTIN) capsule 600 mg  600 mg Oral TID Jesse Sans, MD      . magnesium hydroxide (MILK OF MAGNESIA) suspension 30 mL  30 mL Oral Daily PRN Clapacs, John T, MD      . prazosin (MINIPRESS) capsule 3 mg  3 mg Oral QHS Jesse Sans, MD   3 mg at 03/31/20 2051  . QUEtiapine (SEROQUEL) tablet 400 mg  400 mg Oral QHS Jesse Sans, MD   400 mg at 03/31/20 2051  . traZODone (DESYREL) tablet 150 mg  150 mg Oral QHS Clapacs, Jackquline Denmark, MD   150 mg at 03/31/20 2050    Lab Results: No results found for this or any previous visit (from the past 48 hour(s)).  Blood Alcohol level:  Lab Results  Component Value Date   ETH <10 03/24/2020   ETH <10 01/22/2020    Metabolic Disorder Labs: Lab Results  Component Value Date   HGBA1C 5.5 01/24/2020   MPG 111.15 01/24/2020   MPG 105.41 12/07/2019   No results found for: PROLACTIN Lab Results  Component Value Date   CHOL 148 01/24/2020   TRIG 72 01/24/2020   HDL 67 01/24/2020   CHOLHDL 2.2 01/24/2020   VLDL 14 01/24/2020   LDLCALC 67 01/24/2020   LDLCALC 83 12/07/2019    Physical Findings: AIMS:  , ,  ,  ,    CIWA:    COWS:     Musculoskeletal: Strength & Muscle Tone: within normal limits Gait & Station: normal Patient leans: N/A   Physical Exam: Physical Exam Vitals reviewed.    ROS  Blood pressure (!) 117/91, pulse (!) 101, temperature 98.1 F (36.7 C), temperature source Oral, resp. rate 17, height 5\' 6"  (1.676 m), weight 84.4 kg, last menstrual period 06/19/2018, SpO2 95 %. Body mass index is 30.02 kg/m.   Psychiatric Specialty Exam:  Presentation  General Appearance: Disheveled  Eye Contact: fair  Speech:Normal Rate  Speech Volume:Normal  Handedness:Right   Mood and Affect  Mood:Euthymic  Affect:Congruent; Constricted   Thought Process  Thought Processes:Coherent  Descriptions of  Associations:Intact  Orientation:Full (Time, Place and Person)  Thought Content:Perseveration  History of Schizophrenia/Schizoaffective disorder:No  Duration of Psychotic Symptoms:Less than six months  Hallucinations: denies today  Ideas of Reference:None  Suicidal Thoughts: denies today  Homicidal Thoughts:Homicidal Thoughts: No   Sensorium  Memory:Immediate Fair  Judgment:Fair  Insight:Fair   Executive Functions  Concentration:Fair  Attention Span:Fair  Recall:Fair  Fund of Knowledge:Fair  Language:Fair   Psychomotor Activity  Psychomotor Activity:Psychomotor Activity: Decreased   Assets  Assets:Communication Skills; Desire for Improvement   Sleep  Sleep:Sleep: Fair  Treatment Plan Summary: Daily contact with patient to assess and evaluate symptoms and progress in treatment and Medication management  Patient is a 50 year old female/female with the above-stated past psychiatric history who is seen in follow-up.  Chart reviewed. Patient discussed with nursing. Patient reports mood and sleep improvement today. Patient continues to complain about left hip pain   Plan:  -continue inpatient psych admission; 15-minute checks; daily contact with patient to assess and evaluate symptoms and progress in treatment; psychoeducation.  -continue scheduled medications: 1)MDD, recurrent, severe with psychotic features- established problem, unstable  - Continue Seroquel 400 mg nightly for mood and hallucinations, continue  fluoxetine 60 mg daily, trazodone 150 mg QHS for insomnia  2) PTSD, chronic- established problem, unstable  - Prozac as above, continue prazosin 3 mg QHS for nightmares  3) HTN- established problem  - Amlodipine 10 mg daily  4) Neuropathic pain  - Increase gabapentin to 600 mg TID   -continue PRN medications.  acetaminophen, albuterol, alum & mag hydroxide-simeth, magnesium hydroxide  -Pertinent Labs: no new labs  ordered today    -Consults: No new consults placed since yesterday    -Disposition: All necessary aftercare will be arranged prior to discharge Likely d/c home with outpatient psych follow-up vs inpatient substance abuse treatment  -  I certify that the patient does need, on a daily basis, active treatment furnished directly by or requiring the supervision of inpatient psychiatric facility personnel.   04/01/20: Psychiatric exam above reviewed and remains accurate. Assessment and plan above reviewed and updated.     Jesse Sans, MD 04/01/2020, 10:47 AM

## 2020-04-01 NOTE — Progress Notes (Signed)
Patient is calm and cooperative. She reports good sleep and appetite. Her main complaint is left hip pain when she bears weight on her left side. Patient denies SI, HI, and AVH this morning but endorses feeling depressed. Patient was given Tylenol for hip pain.  Medications were given per MD orders. Support and encouragement provided. Patient remains safe on the unit at this time and q15 min safety checks are maintained.

## 2020-04-01 NOTE — Plan of Care (Signed)
  Problem: Group Participation Goal: STG - Patient will engage in groups without prompting or encouragement from LRT x3 group sessions within 5 recreation therapy group sessions Description: STG - Patient will engage in groups without prompting or encouragement from LRT x3 group sessions within 5 recreation therapy group sessions Outcome: Not Progressing   

## 2020-04-02 NOTE — Progress Notes (Signed)
Recreation Therapy Notes   Date: 04/02/2020  Time: 9:30 am   Location: Craft room    Behavioral response: N/A   Intervention Topic: Creative Expressions   Discussion/Intervention: Patient did not attend group.   Clinical Observations/Feedback:  Patient did not attend group.   Shaverence Outlaw LRT/CTRS        Shaverence  Outlaw 04/02/2020 11:42 AM

## 2020-04-02 NOTE — [facility] Group Notes (Signed)
[facility] Group Notes:  (Nursing/MHT/Case Management/Adjunct)  Date:  04/02/2020  Time:  5:17 AM  Type of Therapy:  wrap up  Participation Level:  Active  Participation Quality:  Appropriate  Affect:  Appropriate  Cognitive:  Appropriate  Insight:  Appropriate  Engagement in Group:  Engaged  Modes of Intervention:  Clarification  Summary of Progress/Problems:  Landry Mellow 04/02/2020, 5:17 AM

## 2020-04-02 NOTE — [facility] Group Notes (Signed)
  LCSW Group Therapy Note     04/02/2020 12:56 PM     Type of Therapy/Topic:  Group Therapy:  Emotion Regulation     Participation Level:  Did Not Attend     Description of Group:   The purpose of this group is to assist patients in learning to regulate negative emotions and experience positive emotions. Patients will be guided to discuss ways in which they have been vulnerable to their negative emotions. These vulnerabilities will be juxtaposed with experiences of positive emotions or situations, and patients will be challenged to use positive emotions to combat negative ones. Special emphasis will be placed on coping with negative emotions in conflict situations, and patients will process healthy conflict resolution skills.     Therapeutic Goals:  1.    Patient will identify two positive emotions or experiences to reflect on in order to balance out negative emotions  2.    Patient will label two or more emotions that they find the most difficult to experience  3.    Patient will demonstrate positive conflict resolution skills through discussion and/or role plays     Summary of Patient Progress x     Therapeutic Modalities:   Cognitive Behavioral Therapy  Feelings Identification  Dialectical Behavioral Therapy   Shannen Vernon [place], MSW, LCSW-A  04/02/2020 12:56 PM

## 2020-04-02 NOTE — Progress Notes (Signed)
Pt is alert and oriented to person, place, time and situation. Pt is calm, cooperative, denies suicidal and homicidal ideation. Pt is very pleasant, isolates in her room often, affect is flat, smiles a little on contact. No distress noted, pt voices no complaints, interacts minimally with staff and peers. Pt is medication complaint. No distress noted, none reported. Pt reports she hopes to find a good program to go to after discharge. Pt uses walker, gait steady with walker. Will continue to monitor pt per Q15 minute face checks and monitor for safety and progress.

## 2020-04-02 NOTE — Progress Notes (Signed)
[facility] MD Progress Note  04/02/2020 11:10 AM Rachel Nolan  MRN:  [id number]  Principal Problem: Severe recurrent major depression with psychotic features (HCC) Diagnosis: Principal Problem:   Severe recurrent major depression with psychotic features (HCC) Active Problems:   Post traumatic stress disorder (PTSD)   Cocaine use disorder (HCC)   Alcohol use disorder, severe, dependence (HCC)   HTN (hypertension)   Neuropathic pain  CC "I couldn't sleep last night"  Rachel Nolan is a 50y.o. female with major depressive disorder and PTSD presenting for worsening mood, suicidal ideations, and homicidal ideations.   Interval History Patient was seen today for re-evaluation.  Nursing reports no events overnight. The patient has no issues with performing ADLs.  Patient has been medication compliant.    Subjective:  On assessment patient notes she had a rough night. She has felt increasingly anxious, and was unable to stop her racing thoughts overnight. However, once she did finally fall asleep she was able to stay asleep without nightmares. She denies hearing any voices for last two days. She denies visual hallucinations, homicidal ideations, and visual hallucinations. She remains hopeful to get into [facility] for substance abuse treatment. Referral sent on her behalf yesterday.    Labs: no new results for review.  Total Time spent with patient: 20 minutes  Past Psychiatric History: see H&P  Past Medical History:  Past Medical History:  Diagnosis Date  . Anxiety   . Asthma   . Depression   . Hypertension   . Scoliosis     Past Surgical History:  Procedure Laterality Date  . BACK SURGERY    . ECTOPIC PREGNANCY SURGERY     Family History:  Family History  Problem Relation Age of Onset  . Hypertension Mother   . Hypertension Father    Family Psychiatric  History: see H&P Social History:  Social History   Substance and Sexual Activity  Alcohol Use Yes   Comment: drinks every other  day     Social History   Substance and Sexual Activity  Drug Use Yes  . Types: Marijuana, Cocaine, "Crack" cocaine   Comment: Currently using crack    Social History   Socioeconomic History  . Marital status: Widowed    Spouse name: Not on file  . Number of children: Not on file  . Years of education: Not on file  . Highest education level: Not on file  Occupational History  . Not on file  Tobacco Use  . Smoking status: Current Every Day Smoker    Packs/day: 0.50    Types: Cigarettes  . Smokeless tobacco: Never Used  Vaping Use  . Vaping Use: Never used  Substance and Sexual Activity  . Alcohol use: Yes    Comment: drinks every other day  . Drug use: Yes    Types: Marijuana, Cocaine, "Crack" cocaine    Comment: Currently using crack  . Sexual activity: Not Currently  Other Topics Concern  . Not on file  Social History Narrative   Pt is homeless, no fixed address; not followed by an outpatient psychiatrist   Social Determinants of Health   Financial Resource Strain: Not on file  Food Insecurity: Not on file  Transportation Needs: Not on file  Physical Activity: Not on file  Stress: Not on file  Social Connections: Not on file   Additional Social History:    Sleep: Fair  Appetite:  Good  Current Medications: Current Facility-Administered Medications  Medication Dose Route Frequency Provider Last Rate Last Admin  .  acetaminophen (TYLENOL) tablet 650 mg  650 mg Oral Q6H PRN Clapacs, Jackquline Denmark, MD   650 mg at 04/01/20 1641  . albuterol (VENTOLIN HFA) 108 (90 Base) MCG/ACT inhaler 1-2 puff  1-2 puff Inhalation TID PRN Thalia Party, MD   2 puff at 04/01/20 2115  . alum & mag hydroxide-simeth (MAALOX/MYLANTA) 200-200-20 MG/5ML suspension 30 mL  30 mL Oral Q4H PRN Clapacs, John T, MD      . amLODipine (NORVASC) tablet 10 mg  10 mg Oral Daily Clapacs, John T, MD   10 mg at 04/02/20 0800  . FLUoxetine (PROZAC) capsule 60 mg  60 mg Oral Daily Jesse Sans, MD   60 mg  at 04/02/20 0800  . gabapentin (NEURONTIN) capsule 600 mg  600 mg Oral TID Jesse Sans, MD   600 mg at 04/02/20 0800  . magnesium hydroxide (MILK OF MAGNESIA) suspension 30 mL  30 mL Oral Daily PRN Clapacs, John T, MD      . prazosin (MINIPRESS) capsule 3 mg  3 mg Oral QHS Jesse Sans, MD   3 mg at 04/01/20 2112  . QUEtiapine (SEROQUEL) tablet 400 mg  400 mg Oral QHS Jesse Sans, MD   400 mg at 04/01/20 2112  . traZODone (DESYREL) tablet 150 mg  150 mg Oral QHS Clapacs, Jackquline Denmark, MD   150 mg at 04/01/20 2112    Lab Results: No results found for this or any previous visit (from the past 48 hour(s)).  Blood Alcohol level:  Lab Results  Component Value Date   ETH <10 03/24/2020   ETH <10 01/22/2020    Metabolic Disorder Labs: Lab Results  Component Value Date   HGBA1C 5.5 01/24/2020   MPG 111.15 01/24/2020   MPG 105.41 12/07/2019   No results found for: PROLACTIN Lab Results  Component Value Date   CHOL 148 01/24/2020   TRIG 72 01/24/2020   HDL 67 01/24/2020   CHOLHDL 2.2 01/24/2020   VLDL 14 01/24/2020   LDLCALC 67 01/24/2020   LDLCALC 83 12/07/2019    Physical Findings: AIMS:  , ,  ,  ,    CIWA:    COWS:     Musculoskeletal: Strength & Muscle Tone: within normal limits Gait & Station: normal Patient leans: N/A   Physical Exam: Physical Exam Vitals reviewed.    ROS  Blood pressure 108/81, pulse (!) 101, temperature 98.3 F (36.8 C), temperature source Oral, resp. rate 17, height 5\' 6"  (1.676 m), weight 84.4 kg, last menstrual period 06/19/2018, SpO2 98 %. Body mass index is 30.02 kg/m.   Psychiatric Specialty Exam:  Presentation  General Appearance: Disheveled  Eye Contact: fair  Speech:Normal Rate  Speech Volume:Normal  Handedness:Right   Mood and Affect  Mood:Anxious  Affect:Congruent; Constricted   Thought Process  Thought Processes:Coherent  Descriptions of Associations:Intact  Orientation:Full (Time, Place  and Person)  Thought Content:Perseveration  History of Schizophrenia/Schizoaffective disorder:No  Duration of Psychotic Symptoms:Less than six months  Hallucinations: denies today  Ideas of Reference:None  Suicidal Thoughts: denies today  Homicidal Thoughts:Homicidal Thoughts: No   Sensorium  Memory:Immediate Fair  Judgment:Fair  Insight:Fair   Executive Functions  Concentration:Fair  Attention Span:Fair  Recall:Fair  Fund of Knowledge:Fair  Language:Fair   Psychomotor Activity  Psychomotor Activity:Psychomotor Activity: Decreased   Assets  Assets:Communication Skills; Desire for Improvement   Sleep  Sleep:Sleep: Fair    Treatment Plan Summary: Daily contact with patient to assess and evaluate symptoms and progress in treatment  and Medication management  Patient is a 50 year old female/female with the above-stated past psychiatric history who is seen in follow-up.  Chart reviewed. Patient discussed with nursing. Patient reports feeling anxious today, and having poor sleep overnight. She denies nightmares.    Plan:  -continue inpatient psych admission; 15-minute checks; daily contact with patient to assess and evaluate symptoms and progress in treatment; psychoeducation.  -continue scheduled medications: 1)MDD, recurrent, severe with psychotic features- established problem, unstable  - Continue Seroquel 400 mg nightly for mood and hallucinations, continue  fluoxetine 60 mg daily, trazodone 150 mg QHS for insomnia  2) PTSD, chronic- established problem, unstable  - Prozac as above, continue prazosin 3 mg QHS for nightmares  3) HTN- established problem  - Amlodipine 10 mg daily  4) Neuropathic pain  - Continue gabapentin to 600 mg TID   -continue PRN medications.  acetaminophen, albuterol, alum & mag hydroxide-simeth, magnesium hydroxide  -Pertinent Labs: no new labs ordered today    -Consults: No new consults placed since  yesterday    -Disposition: All necessary aftercare will be arranged prior to discharge Likely d/c home with outpatient psych follow-up vs inpatient substance abuse treatment  -  I certify that the patient does need, on a daily basis, active treatment furnished directly by or requiring the supervision of inpatient psychiatric facility personnel.   04/02/20 Psychiatric exam above reviewed and remains accurate. Assessment and plan above reviewed and updated.    Jesse Sans, MD 04/02/2020, 11:10 AM

## 2020-04-02 NOTE — [facility] Counselor (Signed)
CSW attempted to contact [facility] ([facility] [phone]) regarding referral. HIPPA complaint voicemail left with contact information for follow through.   Vilma Meckel. Algis Greenhouse, MSW, LCSW, LCAS 04/02/2020 3:28 PM

## 2020-04-03 ENCOUNTER — Inpatient Hospital Stay: Payer: 59

## 2020-04-03 MED ORDER — IBUPROFEN 200 MG PO TABS
400.0000 mg | ORAL_TABLET | ORAL | Status: DC | PRN
  Administered 2020-04-05 – 2020-04-11 (×6): 400 mg via ORAL
  Filled 2020-04-03 (×6): qty 2

## 2020-04-03 NOTE — Progress Notes (Signed)
Physical Therapy Treatment Patient Details Name: Rachel Nolan MRN: [id number] DOB: [birth year] Today's Date: 04/03/2020    History of Present Illness Pt is a 50 year old female with major depressive disorder and PTSD presenting for worsening mood, suicidal ideations, and homicidal ideations.  PMH includes HTN, neuropathic pain, scoliosis, and back surgery.    PT Comments    Pt declined EOB/OOB activity secondary to LLE/hip pain with mobility and weight bearing.  Pt agreed to below therex and put forth good effort throughout but was limited in what she could tolerate with her LLE, MD notified.  Pt will benefit from HHPT services upon discharge to safely address deficits listed in patient problem list for decreased caregiver assistance and eventual return to PLOF.    Follow Up Recommendations  Home health PT;Supervision - Intermittent     Equipment Recommendations  None recommended by PT    Recommendations for Other Services       Precautions / Restrictions Precautions Precautions: None Restrictions Weight Bearing Restrictions: No    Mobility  Bed Mobility               General bed mobility comments: Pt declined OOB secondary to LLE/hip pain    Transfers                    Ambulation/Gait                 Stairs             Wheelchair Mobility    Modified Rankin (Stroke Patients Only)       Balance                                            Cognition Arousal/Alertness: Awake/alert Behavior During Therapy: WFL for tasks assessed/performed Overall Cognitive Status: Within Functional Limits for tasks assessed                                        Exercises Total Joint Exercises Ankle Circles/Pumps: AROM;Strengthening;Both;5 reps;10 reps Quad Sets: Strengthening;10 reps;Both;5 reps Gluteal Sets: Strengthening;Both;10 reps;5 reps Heel Slides: Strengthening;Right;10 reps Hip ABduction/ADduction:  Strengthening;Right;10 reps Straight Leg Raises: Strengthening;Right;10 reps Bridges: Strengthening;Right;5 reps (limited amplitude)    General Comments        Pertinent Vitals/Pain Pain Assessment: 0-10 Pain Score: 10-Worst pain ever Pain Location: L hip/LLE pain with WB only, 0/10 at rest Pain Descriptors / Indicators: Sore;Sharp Pain Intervention(s): Monitored during session    Home Living                      Prior Function            PT Goals (current goals can now be found in the care plan section) Progress towards PT goals: Not progressing toward goals - comment (limited by L hip pain)    Frequency    Min 2X/week      PT Plan Current plan remains appropriate    Co-evaluation              AM-PAC PT "6 Clicks" Mobility   Outcome Measure  Help needed turning from your back to your side while in a flat bed without using bedrails?: None Help needed moving from lying on your back to sitting on the  side of a flat bed without using bedrails?: None Help needed moving to and from a bed to a chair (including a wheelchair)?: None Help needed standing up from a chair using your arms (e.g., wheelchair or bedside chair)?: A Little Help needed to walk in hospital room?: A Little Help needed climbing 3-5 steps with a railing? : A Little 6 Click Score: 21    End of Session   Activity Tolerance: Patient limited by pain Patient left: in bed Nurse Communication: Mobility status;Other (comment) (MD notified of significant L hip/LLE pain) PT Visit Diagnosis: Difficulty in walking, not elsewhere classified (R26.2);Muscle weakness (generalized) (M62.81);Pain Pain - Right/Left: Left Pain - part of body: Hip;Leg     Time: 8546-2703 PT Time Calculation (min) (ACUTE ONLY): 14 min  Charges:  $Therapeutic Exercise: 8-22 mins                     D. Scott Hendy Brindle PT, DPT 04/03/20, 3:35 PM

## 2020-04-03 NOTE — [facility] Group Notes (Signed)
[facility] Group Notes:  (Nursing/MHT/Case Management/Adjunct)  Date:  04/03/2020  Time:  12:25 AM  Type of Therapy:  wrap up  Participation Level:  Did Not Attend   Landry Mellow 04/03/2020, 12:25 AM

## 2020-04-03 NOTE — [facility] Group Notes (Signed)
LCSW Group Therapy Note  04/03/2020 12:36 PM  Type of Therapy/Topic:  Group Therapy:  Balance in Life  Participation Level:  Did Not Attend  Description of Group:    This group will address the concept of balance and how it feels and looks when one is unbalanced. Patients will be encouraged to process areas in their lives that are out of balance and identify reasons for remaining unbalanced. Facilitators will guide patients in utilizing problem-solving interventions to address and correct the stressor making their life unbalanced. Understanding and applying boundaries will be explored and addressed for obtaining and maintaining a balanced life. Patients will be encouraged to explore ways to assertively make their unbalanced needs known to significant others in their lives, using other group members and facilitator for support and feedback.  Therapeutic Goals: 1. Patient will identify two or more emotions or situations they have that consume much of in their lives. 2. Patient will identify signs/triggers that life has become out of balance:  3. Patient will identify two ways to set boundaries in order to achieve balance in their lives:  4. Patient will demonstrate ability to communicate their needs through discussion and/or role plays  Summary of Patient Progress: X  Therapeutic Modalities:   Cognitive Behavioral Therapy Solution-Focused Therapy Assertiveness Training  Katriona Schmierer MSW, LCSW 04/03/2020 12:36 PM    

## 2020-04-03 NOTE — Plan of Care (Signed)
Pt out and about in the milieu with her walker at the beginning of the shift. The patient spent time talking to family on the phone before bedtime. The patient was given medication for hip pain, which was efffective. Hs meds administered. Pt denies SI/HI/AVH. Pt's mood is pleasant and affect is happy. Q15 minutes safety checks maintained.  Problem: Education: Goal: Knowledge of [facility] Education information/materials will improve Outcome: Progressing Goal: Emotional status will improve Outcome: Progressing Goal: Mental status will improve Outcome: Progressing Goal: Verbalization of understanding the information provided will improve Outcome: Progressing   Problem: Activity: Goal: Interest or engagement in activities will improve Outcome: Progressing Goal: Sleeping patterns will improve Outcome: Progressing   Problem: Coping: Goal: Ability to verbalize frustrations and anger appropriately will improve Outcome: Progressing Goal: Ability to demonstrate self-control will improve Outcome: Progressing   Problem: Health Behavior/Discharge Planning: Goal: Identification of resources available to assist in meeting health care needs will improve Outcome: Progressing Goal: Compliance with treatment plan for underlying cause of condition will improve Outcome: Progressing   Problem: Physical Regulation: Goal: Ability to maintain clinical measurements within normal limits will improve Outcome: Progressing   Problem: Safety: Goal: Periods of time without injury will increase Outcome: Progressing   Problem: Education: Goal: Utilization of techniques to improve thought processes will improve Outcome: Progressing Goal: Knowledge of the prescribed therapeutic regimen will improve Outcome: Progressing   Problem: Activity: Goal: Interest or engagement in leisure activities will improve Outcome: Progressing Goal: Imbalance in normal sleep/wake cycle will improve Outcome:  Progressing   Problem: Coping: Goal: Coping ability will improve Outcome: Progressing Goal: Will verbalize feelings Outcome: Progressing   Problem: Health Behavior/Discharge Planning: Goal: Ability to make decisions will improve Outcome: Progressing Goal: Compliance with therapeutic regimen will improve Outcome: Progressing   Problem: Role Relationship: Goal: Will demonstrate positive changes in social behaviors and relationships Outcome: Progressing   Problem: Safety: Goal: Ability to disclose and discuss suicidal ideas will improve Outcome: Progressing Goal: Ability to identify and utilize support systems that promote safety will improve Outcome: Progressing   Problem: Self-Concept: Goal: Will verbalize positive feelings about self Outcome: Progressing Goal: Level of anxiety will decrease Outcome: Progressing   Problem: Education: Goal: Ability to state activities that reduce stress will improve Outcome: Progressing   Problem: Coping: Goal: Ability to identify and develop effective coping behavior will improve Outcome: Progressing   Problem: Self-Concept: Goal: Ability to identify factors that promote anxiety will improve Outcome: Progressing Goal: Level of anxiety will decrease Outcome: Progressing Goal: Ability to modify response to factors that promote anxiety will improve Outcome: Progressing   Problem: Education: Goal: Ability to make informed decisions regarding treatment will improve Outcome: Progressing   Problem: Coping: Goal: Coping ability will improve Outcome: Progressing   Problem: Health Behavior/Discharge Planning: Goal: Identification of resources available to assist in meeting health care needs will improve Outcome: Progressing   Problem: Medication: Goal: Compliance with prescribed medication regimen will improve Outcome: Progressing   Problem: Self-Concept: Goal: Ability to disclose and discuss suicidal ideas will improve Outcome:  Progressing Goal: Will verbalize positive feelings about self Outcome: Progressing

## 2020-04-03 NOTE — Progress Notes (Signed)
Patient is calm and cooperative with assessment. She reports fair sleep and good appetite. She denies SI, HI, and AVH. Patient rates her depression as a 10/10, hopelessness as an 8/10, and anxiety as an 8/10. Patient is rating her left hip pain as an 8/10. Patient states that her goal is to figure out a plan for discharge so that she is not homeless.   Medications were given per MD orders. Support and encouragement was provided. Patient remains safe on the unit at this time and q15 min safety checks are maintained.

## 2020-04-03 NOTE — [facility] Counselor (Signed)
CSW received mail from [facility] with the lumbosacral brace.  CSW left this with the pt's nurse who reports that she will place with the patient's belongings.   Penni Homans, MSW, LCSW 04/03/2020 1:57 PM

## 2020-04-03 NOTE — Progress Notes (Signed)
Recreation Therapy Notes  Date: 04/03/2020  Time: 9:30 am   Location: Craft room    Behavioral response: N/A   Intervention Topic: Self-esteem   Discussion/Intervention: Patient did not attend group.   Clinical Observations/Feedback:  Patient did not attend group.   Shaverence Outlaw LRT/CTRS       Shaverence  Outlaw 04/03/2020 11:31 AM

## 2020-04-03 NOTE — Progress Notes (Signed)
[facility] MD Progress Note  04/03/2020 10:29 AM Rachel Nolan  MRN:  [id number]  Principal Problem: Severe recurrent major depression with psychotic features (HCC) Diagnosis: Principal Problem:   Severe recurrent major depression with psychotic features (HCC) Active Problems:   Post traumatic stress disorder (PTSD)   Cocaine use disorder (HCC)   Alcohol use disorder, severe, dependence (HCC)   HTN (hypertension)   Neuropathic pain  CC "My hip is killing me this morning"  Rachel Nolan is a 50y.o. female with major depressive disorder and PTSD presenting for worsening mood, suicidal ideations, and homicidal ideations.   Interval History Patient was seen today for re-evaluation.  Nursing reports no events overnight. The patient has no issues with performing ADLs.  Patient has been medication compliant.    Subjective:  On assessment patient notes that she continues to have pain from her lower back down to the left leg that her physical therapist told her was sciatica. She denies any recent falls. A lumbrosacral brace was ordered for patient and should be arriving today vs tomorrow to assist. She continues to work with physical therapy on the unit. She denies suicidal ideations, homicidal ideations, visual hallucinations, and auditory hallucinations. She denies nightmares, she denies medication side effects. Still awaiting decision from [facility] for substance abuse treatment.    Labs: no new results for review.  Total Time spent with patient: 20 minutes  Past Psychiatric History: see H&P  Past Medical History:  Past Medical History:  Diagnosis Date  . Anxiety   . Asthma   . Depression   . Hypertension   . Scoliosis     Past Surgical History:  Procedure Laterality Date  . BACK SURGERY    . ECTOPIC PREGNANCY SURGERY     Family History:  Family History  Problem Relation Age of Onset  . Hypertension Mother   . Hypertension Father    Family Psychiatric  History: see H&P Social  History:  Social History   Substance and Sexual Activity  Alcohol Use Yes   Comment: drinks every other day     Social History   Substance and Sexual Activity  Drug Use Yes  . Types: Marijuana, Cocaine, "Crack" cocaine   Comment: Currently using crack    Social History   Socioeconomic History  . Marital status: Widowed    Spouse name: Not on file  . Number of children: Not on file  . Years of education: Not on file  . Highest education level: Not on file  Occupational History  . Not on file  Tobacco Use  . Smoking status: Current Every Day Smoker    Packs/day: 0.50    Types: Cigarettes  . Smokeless tobacco: Never Used  Vaping Use  . Vaping Use: Never used  Substance and Sexual Activity  . Alcohol use: Yes    Comment: drinks every other day  . Drug use: Yes    Types: Marijuana, Cocaine, "Crack" cocaine    Comment: Currently using crack  . Sexual activity: Not Currently  Other Topics Concern  . Not on file  Social History Narrative   Pt is homeless, no fixed address; not followed by an outpatient psychiatrist   Social Determinants of Health   Financial Resource Strain: Not on file  Food Insecurity: Not on file  Transportation Needs: Not on file  Physical Activity: Not on file  Stress: Not on file  Social Connections: Not on file   Additional Social History:    Sleep: Fair  Appetite:  Good  Current  Medications: Current Facility-Administered Medications  Medication Dose Route Frequency Provider Last Rate Last Admin  . acetaminophen (TYLENOL) tablet 650 mg  650 mg Oral Q6H PRN Clapacs, Jackquline Denmark, MD   650 mg at 04/03/20 0757  . albuterol (VENTOLIN HFA) 108 (90 Base) MCG/ACT inhaler 1-2 puff  1-2 puff Inhalation TID PRN Thalia Party, MD   2 puff at 04/01/20 2115  . alum & mag hydroxide-simeth (MAALOX/MYLANTA) 200-200-20 MG/5ML suspension 30 mL  30 mL Oral Q4H PRN Clapacs, John T, MD      . amLODipine (NORVASC) tablet 10 mg  10 mg Oral Daily Clapacs, Jackquline Denmark, MD    10 mg at 04/03/20 0757  . FLUoxetine (PROZAC) capsule 60 mg  60 mg Oral Daily Jesse Sans, MD   60 mg at 04/03/20 0757  . gabapentin (NEURONTIN) capsule 600 mg  600 mg Oral TID Jesse Sans, MD   600 mg at 04/03/20 0757  . magnesium hydroxide (MILK OF MAGNESIA) suspension 30 mL  30 mL Oral Daily PRN Clapacs, John T, MD      . prazosin (MINIPRESS) capsule 3 mg  3 mg Oral QHS Jesse Sans, MD   3 mg at 04/02/20 2140  . QUEtiapine (SEROQUEL) tablet 400 mg  400 mg Oral QHS Jesse Sans, MD   400 mg at 04/02/20 2140  . traZODone (DESYREL) tablet 150 mg  150 mg Oral QHS Clapacs, Jackquline Denmark, MD   150 mg at 04/02/20 2140    Lab Results: No results found for this or any previous visit (from the past 48 hour(s)).  Blood Alcohol level:  Lab Results  Component Value Date   ETH <10 03/24/2020   ETH <10 01/22/2020    Metabolic Disorder Labs: Lab Results  Component Value Date   HGBA1C 5.5 01/24/2020   MPG 111.15 01/24/2020   MPG 105.41 12/07/2019   No results found for: PROLACTIN Lab Results  Component Value Date   CHOL 148 01/24/2020   TRIG 72 01/24/2020   HDL 67 01/24/2020   CHOLHDL 2.2 01/24/2020   VLDL 14 01/24/2020   LDLCALC 67 01/24/2020   LDLCALC 83 12/07/2019    Physical Findings: AIMS:  , ,  ,  ,    CIWA:    COWS:     Musculoskeletal: Strength & Muscle Tone: within normal limits Gait & Station: normal Patient leans: N/A   Physical Exam: Physical Exam Vitals reviewed.    ROS  Blood pressure 119/77, pulse (!) 101, temperature 98.1 F (36.7 C), temperature source Oral, resp. rate 17, height 5\' 6"  (1.676 m), weight 84.4 kg, last menstrual period 06/19/2018, SpO2 98 %. Body mass index is 30.02 kg/m.   Psychiatric Specialty Exam:  Presentation  General Appearance: Casual  Eye Contact: fair  Speech:Normal Rate  Speech Volume:Normal  Handedness:Right   Mood and Affect  Mood:Anxious  Affect:Congruent; Constricted   Thought Process   Thought Processes:Coherent  Descriptions of Associations:Intact  Orientation:Full (Time, Place and Person)  Thought Content:Perseveration  History of Schizophrenia/Schizoaffective disorder:No  Duration of Psychotic Symptoms:Less than six months  Hallucinations: denies today  Ideas of Reference:None  Suicidal Thoughts: denies today  Homicidal Thoughts:Homicidal Thoughts: No   Sensorium  Memory:Immediate Fair  Judgment:Fair  Insight:Fair   Executive Functions  Concentration:Fair  Attention Span:Fair  Recall:Fair  Fund of Knowledge:Fair  Language:Fair   Psychomotor Activity  Psychomotor Activity:Psychomotor Activity: Decreased   Assets  Assets:Communication Skills; Desire for Improvement   Sleep  Sleep:Sleep: Fair  Treatment Plan Summary: Daily contact with patient to assess and evaluate symptoms and progress in treatment and Medication management  Patient is a 50 year old female/female with the above-stated past psychiatric history who is seen in follow-up.  Chart reviewed. Patient discussed with nursing. Patient reports feeling anxious secondary to increased pain today. She denies suicidal ideations, homicidal ideations, visual hallucinations, or auditory hallucinations. She denies nightmares.    Plan:  -continue inpatient psych admission; 15-minute checks; daily contact with patient to assess and evaluate symptoms and progress in treatment; psychoeducation.  -continue scheduled medications: 1)MDD, recurrent, severe with psychotic features- established problem, improving  - Continue Seroquel 400 mg nightly for mood and hallucinations, continue  fluoxetine 60 mg daily, trazodone 150 mg QHS for insomnia  2) PTSD, chronic- established problem, improving - Prozac as above, continue prazosin 3 mg QHS for nightmares  3) HTN- established problem. stable - Amlodipine 10 mg daily  4) Neuropathic pain  - Continue gabapentin to  600 mg TID   -continue PRN medications.  acetaminophen, albuterol, alum & mag hydroxide-simeth, magnesium hydroxide  -Pertinent Labs: no new labs ordered today    -Consults: No new consults placed since yesterday    -Disposition: All necessary aftercare will be arranged prior to discharge Likely d/c home with outpatient psych follow-up vs inpatient substance abuse treatment  -  I certify that the patient does need, on a daily basis, active treatment furnished directly by or requiring the supervision of inpatient psychiatric facility personnel.   04/03/20: Psychiatric exam above reviewed and remains accurate. Assessment and plan above reviewed and updated.    Jesse Sans, MD 04/03/2020, 10:29 AM

## 2020-04-04 NOTE — Progress Notes (Signed)
Patient is calm and cooperative. Patient complains of left hip pain and was given Tylenol.Rachel Nolan done yesterday did not show evidence of hip fracture or dislocation. Patient stays in bed, coming out only for meals. Patient denies SI, HI, and AVH.  Patient remains safe on the unit at this time and q15 min safety checks are maintained.

## 2020-04-04 NOTE — BHH Counselor (Signed)
CSW assisted pt with contact to [facility] for screening. Pt was open to trying them but acknowledged worry about having a top bunk due to physical health issues. She was told that the lower bunk had a waitlist of some kind and that she would need a backup plan if she got there and it did not work out. After screening, pt spoke with CSW about these worries and stated that the [facility] would not be appropriate for her only because of the top bunk issue. No other concerns expressed. Contact ended without issue.   CSW contacted [facility] to update regarding pt decision due to inability to access a top bunk. HIPPA compliant voicemail left with contact information for follow up.   Vilma Meckel. Algis Greenhouse, MSW, LCSW, LCAS 04/04/2020 10:39 AM

## 2020-04-04 NOTE — BHH Counselor (Addendum)
CSW with patient present called [facility] in an effort to call and schedule an application appointment.  CSW had to leave HIPAA compliant voicemail requesting return phone call.  Pt reports that she does not wish to got to [facility].  Pt reported that she wanted to try [facility] for treatment.  CSW received confirmation the fax was successful.  Penni Homans, MSW, LCSW 04/04/2020 11:06 AM

## 2020-04-04 NOTE — Progress Notes (Signed)
Patient presents with sad affect but brightens on approach. Continues to complain of left hip pain. Tylenol given with partial relief. Reports xray done today. Pt denies Si, HI, AVH but endorses depression. Pt out of room in dayroom waiting on snack. Meds given for sleep with good relief. Encouragement and support provided. Safety checks maintained. Medications given as prescribed. Pt receptive and remains safe on unit with q 15 min checks.

## 2020-04-04 NOTE — Plan of Care (Signed)
  Problem: Education: Goal: Knowledge of [facility] Education information/materials will improve Outcome: Progressing Goal: Emotional status will improve Outcome: Progressing Goal: Mental status will improve Outcome: Progressing   Problem: Activity: Goal: Sleeping patterns will improve Outcome: Progressing   Problem: Coping: Goal: Ability to verbalize frustrations and anger appropriately will improve Outcome: Progressing   Problem: Safety: Goal: Periods of time without injury will increase Outcome: Progressing   

## 2020-04-04 NOTE — BHH Counselor (Addendum)
CSW contacted [facility], female shelter for bed.  CSW found the office to be closed and left HIPAA compliant voicemail requesting a return call.   CSW spoke with [facility], shelter in [place].  They report that they "do not complete intakes on Friday and even if we did all our staff has left for the day".  CSW was asked to call back on Monday for bed availability.  Penni Homans, MSW, LCSW 04/04/2020 10:51 AM

## 2020-04-04 NOTE — BHH Group Notes (Signed)
LCSW Group Therapy Note  04/04/2020 2:28 PM  Type of Therapy/Topic:  Group Therapy: Emotional Regulation   Participation Level:Did Not Attend  Description of Group: Milieu was in state of chaos. Group was held outside to utilize coping skills to help deal with strong emotions. Positive recreational activity and mindfulness were practiced to assist with distraction/amelioration of negative emotional outbursts.   Therapeutic Goals: 1. Patient will identify positive activities to help distract/ameliorate negative emotions. 2. Patient will demonstrate positive leisure activity/mindfulness practice.  Summary of Patient Progress: X   Therapeutic Modalities: Mindfulness Positive leisure  Vilma Meckel. Algis Greenhouse, MSW, LCSW, LCAS 04/04/2020 2:28 PM

## 2020-04-04 NOTE — BHH Counselor (Signed)
CSW contacted [facility] to check on bed availability. CSW was informed that last female bed was already filled today.   Vilma Meckel. Algis Greenhouse, MSW, LCSW, LCAS 04/04/2020 10:52 AM

## 2020-04-04 NOTE — BHH Counselor (Signed)
CSW faxed referral for [facility] and [facility].  Both faxes were successful.  Penni Homans, MSW, LCSW 04/04/2020 1:23 PM

## 2020-04-04 NOTE — Progress Notes (Signed)
Recreation Therapy Notes   Date: 04/04/2020  Time: 9:30 am   Location: Craft room    Behavioral response: N/A   Intervention Topic: Leisure   Discussion/Intervention: Patient did not attend group.   Clinical Observations/Feedback:  Patient did not attend group.   Rachel Nolan LRT/CTRS        Rachel  Nolan 04/04/2020 10:49 AM 

## 2020-04-04 NOTE — Progress Notes (Signed)
[facility] MD Progress Note  04/04/2020 11:19 AM Rachel Nolan  MRN:  [id number]  Principal Problem: Severe recurrent major depression with psychotic features (HCC) Diagnosis: Principal Problem:   Severe recurrent major depression with psychotic features (HCC) Active Problems:   Post traumatic stress disorder (PTSD)   Cocaine use disorder (HCC)   Alcohol use disorder, severe, dependence (HCC)   HTN (hypertension)   Neuropathic pain  CC "Upset today"  [name] is a 50y.o. female with major depressive disorder and PTSD presenting for worsening mood, suicidal ideations, and homicidal ideations.   Interval History Patient was seen today for re-evaluation.  Nursing reports no events overnight. The patient has no issues with performing ADLs.  Patient has been medication compliant.    Subjective:  On assessment patient notes that she is feeling sad today because her daughter has informed her she can no longer see her grandchildren until she seeks substance abuse treatment. Team has still not heard a verdict from [facility], but they only have top bunk beds available. Patient unable to climb up to top bunk secondary to scoliosis and severe hip pain. Social work team has reached out to several other shelters today. [facility] filled, [facility] states to call back Monday, message left with [facility]. Applications for [facility] also sent today. Team also trying to set up a telephone interview with [facility].  She denies suicidal ideations, homicidal ideations, visual hallucinations, and auditory hallucinations. She denies nightmares, she denies medication side effects.    Total Time spent with patient: 20 minutes  Past Psychiatric History: see H&P  Past Medical History:  Past Medical History:  Diagnosis Date  . Anxiety   . Asthma   . Depression   . Hypertension   . Scoliosis     Past Surgical History:  Procedure Laterality  Date  . BACK SURGERY    . ECTOPIC PREGNANCY SURGERY     Family History:  Family History  Problem Relation Age of Onset  . Hypertension Mother   . Hypertension Father    Family Psychiatric  History: see H&P Social History:  Social History   Substance and Sexual Activity  Alcohol Use Yes   Comment: drinks every other day     Social History   Substance and Sexual Activity  Drug Use Yes  . Types: Marijuana, Cocaine, "Crack" cocaine   Comment: Currently using crack    Social History   Socioeconomic History  . Marital status: Widowed    Spouse name: Not on file  . Number of children: Not on file  . Years of education: Not on file  . Highest education level: Not on file  Occupational History  . Not on file  Tobacco Use  . Smoking status: Current Every Day Smoker    Packs/day: 0.50    Types: Cigarettes  . Smokeless tobacco: Never Used  Vaping Use  . Vaping Use: Never used  Substance and Sexual Activity  . Alcohol use: Yes    Comment: drinks every other day  . Drug use: Yes    Types: Marijuana, Cocaine, "Crack" cocaine    Comment: Currently using crack  . Sexual activity: Not Currently  Other Topics Concern  . Not on file  Social History Narrative   Pt is homeless, no fixed address; not followed by an outpatient psychiatrist   Social Determinants of Health   Financial Resource Strain: Not on file  Food Insecurity: Not on file  Transportation Needs:  Not on file  Physical Activity: Not on file  Stress: Not on file  Social Connections: Not on file   Additional Social History:    Sleep: Fair  Appetite:  Good  Current Medications: Current Facility-Administered Medications  Medication Dose Route Frequency Provider Last Rate Last Admin  . acetaminophen (TYLENOL) tablet 650 mg  650 mg Oral Q6H PRN Clapacs, Jackquline Denmark, MD   650 mg at 04/04/20 5456  . albuterol (VENTOLIN HFA) 108 (90 Base) MCG/ACT inhaler 1-2 puff  1-2 puff Inhalation TID PRN Thalia Party, MD   2  puff at 04/01/20 2115  . alum & mag hydroxide-simeth (MAALOX/MYLANTA) 200-200-20 MG/5ML suspension 30 mL  30 mL Oral Q4H PRN Clapacs, John T, MD      . amLODipine (NORVASC) tablet 10 mg  10 mg Oral Daily Clapacs, Jackquline Denmark, MD   10 mg at 04/04/20 2563  . FLUoxetine (PROZAC) capsule 60 mg  60 mg Oral Daily Jesse Sans, MD   60 mg at 04/04/20 8937  . gabapentin (NEURONTIN) capsule 600 mg  600 mg Oral TID Jesse Sans, MD   600 mg at 04/04/20 3428  . ibuprofen (ADVIL) tablet 400 mg  400 mg Oral Q4H PRN Jesse Sans, MD      . magnesium hydroxide (MILK OF MAGNESIA) suspension 30 mL  30 mL Oral Daily PRN Clapacs, John T, MD      . prazosin (MINIPRESS) capsule 3 mg  3 mg Oral QHS Jesse Sans, MD   3 mg at 04/03/20 2129  . QUEtiapine (SEROQUEL) tablet 400 mg  400 mg Oral QHS Jesse Sans, MD   400 mg at 04/03/20 2127  . traZODone (DESYREL) tablet 150 mg  150 mg Oral QHS Clapacs, Jackquline Denmark, MD   150 mg at 04/03/20 2128    Lab Results: No results found for this or any previous visit (from the past 48 hour(s)).  Blood Alcohol level:  Lab Results  Component Value Date   ETH <10 03/24/2020   ETH <10 01/22/2020    Metabolic Disorder Labs: Lab Results  Component Value Date   HGBA1C 5.5 01/24/2020   MPG 111.15 01/24/2020   MPG 105.41 12/07/2019   No results found for: PROLACTIN Lab Results  Component Value Date   CHOL 148 01/24/2020   TRIG 72 01/24/2020   HDL 67 01/24/2020   CHOLHDL 2.2 01/24/2020   VLDL 14 01/24/2020   LDLCALC 67 01/24/2020   LDLCALC 83 12/07/2019    Physical Findings: AIMS:  , ,  ,  ,    CIWA:    COWS:     Musculoskeletal: Strength & Muscle Tone: within normal limits Gait & Station: normal Patient leans: N/A   Physical Exam: Physical Exam Vitals reviewed.    ROS  Blood pressure 102/77, pulse 86, temperature 98.4 F (36.9 C), temperature source Oral, resp. rate 17, height 5\' 6"  (1.676 m), weight 84.4 kg, last menstrual period 06/19/2018,  SpO2 98 %. Body mass index is 30.02 kg/m.   Psychiatric Specialty Exam:  Presentation  General Appearance: Casual  Eye Contact: fair  Speech:Normal Rate  Speech Volume:Normal  Handedness:Right   Mood and Affect  Mood:Anxious  Affect:Congruent; Constricted   Thought Process  Thought Processes:Coherent  Descriptions of Associations:Intact  Orientation:Full (Time, Place and Person)  Thought Content:Perseveration  History of Schizophrenia/Schizoaffective disorder:No  Duration of Psychotic Symptoms:Less than six months  Hallucinations: denies today  Ideas of Reference:None  Suicidal Thoughts: denies today  Homicidal Thoughts:Homicidal  Thoughts: No   Sensorium  Memory:Immediate Fair  Judgment:Fair  Insight:Fair   Executive Functions  Concentration:Fair  Attention Span:Fair  Recall:Fair  Fund of Knowledge:Fair  Language:Fair   Psychomotor Activity  Psychomotor Activity:Psychomotor Activity: Decreased   Assets  Assets:Communication Skills; Desire for Improvement   Sleep  Sleep:Sleep: Fair    Treatment Plan Summary: Daily contact with patient to assess and evaluate symptoms and progress in treatment and Medication management  Patient is a 50 year old female/female with the above-stated past psychiatric history who is seen in follow-up.  Chart reviewed. Patient discussed with nursing. Patient denies suicidal ideations, homicidal ideations, visual hallucinations, or auditory hallucinations. She denies nightmares.    Plan:  -continue inpatient psych admission; 15-minute checks; daily contact with patient to assess and evaluate symptoms and progress in treatment; psychoeducation.  -continue scheduled medications: 1)MDD, recurrent, severe with psychotic features- established problem, improved - Continue Seroquel 400 mg nightly for mood and hallucinations, continue  fluoxetine 60 mg daily, trazodone 150 mg  QHS for insomnia  2) PTSD, chronic- established problem, improving - Prozac as above, continue prazosin 3 mg QHS for nightmares  3) HTN- established problem. stable - Amlodipine 10 mg daily  4) Neuropathic pain  - Continue gabapentin to 600 mg TID   -continue PRN medications.  acetaminophen, albuterol, alum & mag hydroxide-simeth, ibuprofen, magnesium hydroxide  -Pertinent Labs: no new labs ordered today    -Consults: No new consults placed since yesterday    -Disposition: All necessary aftercare will be arranged prior to discharge Likely d/c to shelter with outpatient psych follow-up vs inpatient substance abuse treatment  -  I certify that the patient does need, on a daily basis, active treatment furnished directly by or requiring the supervision of inpatient psychiatric facility personnel.   04/04/20: Psychiatric exam above reviewed and remains accurate. Assessment and plan above reviewed and updated.    Jesse Sans, MD 04/04/2020, 11:19 AM

## 2020-04-05 MED ORDER — PSYLLIUM 95 % PO PACK
1.0000 | PACK | Freq: Every day | ORAL | Status: DC
  Administered 2020-04-06 – 2020-04-09 (×3): 1 via ORAL
  Filled 2020-04-05 (×8): qty 1

## 2020-04-05 NOTE — Progress Notes (Signed)
Patient pleasant and cooperative. Isolative to self and room. Medication compliant. Appropriate with staff and peers. No new changes noted. Continues to deny SI, HI, AVH. Endorses depression.  Encouragement and support provided. Safety checks maintained. Medications given as prescribed. Pt receptive and remains safe on unit with q 15 min checks.

## 2020-04-05 NOTE — Progress Notes (Signed)
   04/05/20 0300  Psych Admission Type (Psych Patients Only)  Admission Status Voluntary  Psychosocial Assessment  Patient Complaints Depression  Eye Contact Fair  Facial Expression Sad  Affect Appropriate to circumstance  Speech Logical/coherent  Interaction Assertive  Motor Activity Slow  Appearance/Hygiene Unremarkable  Behavior Characteristics Cooperative  Mood Pleasant  Thought Process  Coherency WDL  Content WDL  Delusions None reported or observed  Perception WDL  Hallucination None reported or observed  Judgment Impaired  Confusion None  Danger to Self  Current suicidal ideation? Denies  Self-Injurious Behavior No self-injurious ideation or behavior indicators observed or expressed   Agreement Not to Harm Self Yes  Description of Agreement Verbal  Danger to Others  Danger to Others None reported or observed  Patient compliant with medication and programming. Denies SI/HI/A/VH this shift. Support and encouragement provided as needed. Will continue to monitor.

## 2020-04-05 NOTE — Plan of Care (Signed)
  Problem: Education: Goal: Knowledge of [facility] Education information/materials will improve Outcome: Progressing Goal: Emotional status will improve Outcome: Not Progressing Goal: Mental status will improve Outcome: Progressing Goal: Verbalization of understanding the information provided will improve Outcome: Progressing   Problem: Activity: Goal: Interest or engagement in activities will improve Outcome: Not Progressing Goal: Sleeping patterns will improve Outcome: Not Progressing   Problem: Coping: Goal: Ability to verbalize frustrations and anger appropriately will improve Outcome: Progressing Goal: Ability to demonstrate self-control will improve Outcome: Progressing   Problem: Health Behavior/Discharge Planning: Goal: Compliance with treatment plan for underlying cause of condition will improve Outcome: Progressing

## 2020-04-05 NOTE — [facility] Group Notes (Signed)
[facility] Group Notes: (Clinical Social Work)   04/05/2020      Type of Therapy:  Group Therapy   Participation Level:  Did Not Attend - was invited individually by Nurse/MHT and chose not to attend.  Patient was asleep    Susa Simmonds, LCSWA 04/05/2020  1:19 PM

## 2020-04-05 NOTE — Progress Notes (Addendum)
[facility] MD Progress Note  04/05/2020 10:01 AM Rachel Nolan  MRN:  [id number]  Principal Problem: Severe recurrent major depression with psychotic features (HCC) Diagnosis: Principal Problem:   Severe recurrent major depression with psychotic features (HCC) Active Problems:   Post traumatic stress disorder (PTSD)   Cocaine use disorder (HCC)   Alcohol use disorder, severe, dependence (HCC)   HTN (hypertension)   Neuropathic pain  CC "okay"   Subjective:   Rachel Nolan was seen for initial psychiatric evaluation by Dr. Neale Burly on March 26, 2020 due to suicidal and homicidal ideations and the concepts of illicit drug use. Seroquel was increased to 400 mg and Minipress to 3 mg. She is taking her medications as prescribed.  Today, she was seen reading in her bed this morning. Denies having any cravings for drugs. Denies depression or anxiety. Says that she tossed and turned last night in her sleep. Still reports mild to moderate nightmares but feels that medications are helping. Eating well. Has not had a bowel movement in three days.  Total Time spent with patient: 25 minutes  Past Psychiatric History: see H&P  Past Medical History:  Past Medical History:  Diagnosis Date  . Anxiety   . Asthma   . Depression   . Hypertension   . Scoliosis     Past Surgical History:  Procedure Laterality Date  . BACK SURGERY    . ECTOPIC PREGNANCY SURGERY     Family History:  Family History  Problem Relation Age of Onset  . Hypertension Mother   . Hypertension Father    Family Psychiatric  History: see H&P Social History:  Social History   Substance and Sexual Activity  Alcohol Use Yes   Comment: drinks every other day     Social History   Substance and Sexual Activity  Drug Use Yes  . Types: Marijuana, Cocaine, "Crack" cocaine   Comment: Currently using crack    Social History   Socioeconomic History  . Marital status: Widowed    Spouse name: Not on file  . Number of children: Not  on file  . Years of education: Not on file  . Highest education level: Not on file  Occupational History  . Not on file  Tobacco Use  . Smoking status: Current Every Day Smoker    Packs/day: 0.50    Types: Cigarettes  . Smokeless tobacco: Never Used  Vaping Use  . Vaping Use: Never used  Substance and Sexual Activity  . Alcohol use: Yes    Comment: drinks every other day  . Drug use: Yes    Types: Marijuana, Cocaine, "Crack" cocaine    Comment: Currently using crack  . Sexual activity: Not Currently  Other Topics Concern  . Not on file  Social History Narrative   Pt is homeless, no fixed address; not followed by an outpatient psychiatrist   Social Determinants of Health   Financial Resource Strain: Not on file  Food Insecurity: Not on file  Transportation Needs: Not on file  Physical Activity: Not on file  Stress: Not on file  Social Connections: Not on file   Additional Social History:    Sleep: Fair  Appetite:  Good  Current Medications: Current Facility-Administered Medications  Medication Dose Route Frequency Provider Last Rate Last Admin  . acetaminophen (TYLENOL) tablet 650 mg  650 mg Oral Q6H PRN Clapacs, Jackquline Denmark, MD   650 mg at 04/05/20 0807  . albuterol (VENTOLIN HFA) 108 (90 Base) MCG/ACT inhaler 1-2 puff  1-2  puff Inhalation TID PRN Thalia Party, MD   2 puff at 04/01/20 2115  . alum & mag hydroxide-simeth (MAALOX/MYLANTA) 200-200-20 MG/5ML suspension 30 mL  30 mL Oral Q4H PRN Clapacs, John T, MD      . amLODipine (NORVASC) tablet 10 mg  10 mg Oral Daily Clapacs, Jackquline Denmark, MD   10 mg at 04/05/20 0807  . FLUoxetine (PROZAC) capsule 60 mg  60 mg Oral Daily Jesse Sans, MD   60 mg at 04/05/20 9373  . gabapentin (NEURONTIN) capsule 600 mg  600 mg Oral TID Jesse Sans, MD   600 mg at 04/05/20 4287  . ibuprofen (ADVIL) tablet 400 mg  400 mg Oral Q4H PRN Jesse Sans, MD      . magnesium hydroxide (MILK OF MAGNESIA) suspension 30 mL  30 mL Oral Daily PRN  Clapacs, John T, MD      . prazosin (MINIPRESS) capsule 3 mg  3 mg Oral QHS Jesse Sans, MD   3 mg at 04/04/20 2104  . psyllium (HYDROCIL/METAMUCIL) 1 packet  1 packet Oral Daily Reggie Pile, MD      . QUEtiapine (SEROQUEL) tablet 400 mg  400 mg Oral QHS Jesse Sans, MD   400 mg at 04/04/20 2104  . traZODone (DESYREL) tablet 150 mg  150 mg Oral QHS Clapacs, Jackquline Denmark, MD   150 mg at 04/04/20 2104    Lab Results: No results found for this or any previous visit (from the past 48 hour(s)).  Blood Alcohol level:  Lab Results  Component Value Date   ETH <10 03/24/2020   ETH <10 01/22/2020    Metabolic Disorder Labs: Lab Results  Component Value Date   HGBA1C 5.5 01/24/2020   MPG 111.15 01/24/2020   MPG 105.41 12/07/2019   No results found for: PROLACTIN Lab Results  Component Value Date   CHOL 148 01/24/2020   TRIG 72 01/24/2020   HDL 67 01/24/2020   CHOLHDL 2.2 01/24/2020   VLDL 14 01/24/2020   LDLCALC 67 01/24/2020   LDLCALC 83 12/07/2019    Physical Findings: AIMS:  , ,  ,  ,    CIWA:    COWS:     Musculoskeletal: Strength & Muscle Tone: within normal limits Gait & Station: normal Patient leans: N/A   Physical Exam: Physical Exam Vitals reviewed.    ROS Blood pressure 115/86, pulse 93, temperature 98.6 F (37 C), temperature source Oral, resp. rate 18, height 5\' 6"  (1.676 m), weight 84.4 kg, last menstrual period 06/19/2018, SpO2 96 %. Body mass index is 30.02 kg/m.   Psychiatric Specialty Exam:  Presentation  General Appearance: Casual  Eye Contact: fair  Speech:Normal Rate  Speech Volume:Normal  Handedness:Right   Mood and Affect  Mood:okay  Affect: neutral   Thought Process  Thought Processes:Coherent  Descriptions of Associations:Intact  Orientation:Full (Time, Place and Person)  Thought Content:Perseveration  History of Schizophrenia/Schizoaffective disorder:No  Duration of Psychotic Symptoms:Less than  six months  Hallucinations: denies today  Ideas of Reference:None  Suicidal Thoughts: denies today  Homicidal Thoughts:Homicidal Thoughts: No   Sensorium  Memory:Immediate Fair  Judgment:good  Insight:Fair   Executive Functions  Concentration:Fair  Attention Span:Fair  Recall:Fair  Fund of Knowledge:Fair  Language:Fair   Psychomotor Activity  Psychomotor Activity:Psychomotor Activity: Decreased   Assets  Assets:Communication Skills; Desire for Improvement   Sleep  Sleep:Sleep: Fair    Treatment Plan Summary: Daily contact with patient to assess and evaluate symptoms and progress in  treatment and Medication management  Patient is a 50 year old female/female with the above-stated past psychiatric history who is seen in follow-up.  Chart reviewed. Patient discussed with nursing. Patient denies suicidal ideations, homicidal ideations, visual hallucinations, or auditory hallucinations. She denies nightmares.    Plan:  -continue inpatient psych admission; 15-minute checks; daily contact with patient to assess and evaluate symptoms and progress in treatment; psychoeducation.  -continue scheduled medications: 1)MDD, recurrent, severe with psychotic features- established problem, improved - Continue Seroquel 400 mg nightly for mood and hallucinations, continue  fluoxetine 60 mg daily, trazodone 150 mg QHS for insomnia  2) PTSD, chronic- established problem, improving - Prozac as above, continue prazosin 3 mg QHS for nightmares  3) HTN- established problem. stable - Amlodipine 10 mg daily  4) Neuropathic pain  - Continue gabapentin to 600 mg TID   -continue PRN medications.  acetaminophen, albuterol, alum & mag hydroxide-simeth, ibuprofen, magnesium hydroxide  -Pertinent Labs: no new labs ordered today    -Consults: No new consults placed since yesterday    -Disposition: All necessary aftercare will be arranged prior to discharge Likely  d/c to shelter with outpatient psych follow-up vs inpatient substance abuse treatment  -  I certify that the patient does need, on a daily basis, active treatment furnished directly by or requiring the supervision of inpatient psychiatric facility personnel.   04/04/20: Psychiatric exam above reviewed and remains accurate. Assessment and plan above reviewed and updated.   04/05/20 [place] x 1   CPT: 9232 Reggie Pile, MD 04/05/2020, 10:01 AM

## 2020-04-05 NOTE — Tx Team (Cosign Needed)
Interdisciplinary Treatment and Diagnostic Plan Update  04/05/2020 Time of Session: 11:00 AM  Rachel Nolan MRN: [id number]  Principal Diagnosis: Severe recurrent major depression with psychotic features [facility])  Secondary Diagnoses: Principal Problem:   Severe recurrent major depression with psychotic features (HCC) Active Problems:   Post traumatic stress disorder (PTSD)   Cocaine use disorder (HCC)   Alcohol use disorder, severe, dependence (HCC)   HTN (hypertension)   Neuropathic pain   Current Medications:  Current Facility-Administered Medications  Medication Dose Route Frequency Provider Last Rate Last Admin  . acetaminophen (TYLENOL) tablet 650 mg  650 mg Oral Q6H PRN Clapacs, Jackquline Denmark, MD   650 mg at 04/05/20 0807  . albuterol (VENTOLIN HFA) 108 (90 Base) MCG/ACT inhaler 1-2 puff  1-2 puff Inhalation TID PRN Thalia Party, MD   2 puff at 04/01/20 2115  . alum & mag hydroxide-simeth (MAALOX/MYLANTA) 200-200-20 MG/5ML suspension 30 mL  30 mL Oral Q4H PRN Clapacs, John T, MD      . amLODipine (NORVASC) tablet 10 mg  10 mg Oral Daily Clapacs, Jackquline Denmark, MD   10 mg at 04/05/20 0807  . FLUoxetine (PROZAC) capsule 60 mg  60 mg Oral Daily Jesse Sans, MD   60 mg at 04/05/20 7408  . gabapentin (NEURONTIN) capsule 600 mg  600 mg Oral TID Jesse Sans, MD   600 mg at 04/05/20 1155  . ibuprofen (ADVIL) tablet 400 mg  400 mg Oral Q4H PRN Jesse Sans, MD      . magnesium hydroxide (MILK OF MAGNESIA) suspension 30 mL  30 mL Oral Daily PRN Clapacs, John T, MD      . prazosin (MINIPRESS) capsule 3 mg  3 mg Oral QHS Jesse Sans, MD   3 mg at 04/04/20 2104  . psyllium (HYDROCIL/METAMUCIL) 1 packet  1 packet Oral Daily Reggie Pile, MD      . QUEtiapine (SEROQUEL) tablet 400 mg  400 mg Oral QHS Jesse Sans, MD   400 mg at 04/04/20 2104  . traZODone (DESYREL) tablet 150 mg  150 mg Oral QHS Clapacs, Jackquline Denmark, MD   150 mg at 04/04/20 2104   PTA Medications: Medications Prior to  Admission  Medication Sig Dispense Refill Last Dose  . amLODipine (NORVASC) 10 MG tablet Take 1 tablet (10 mg total) by mouth daily. 30 tablet 1   . FLUoxetine (PROZAC) 40 MG capsule Take 1 capsule (40 mg total) by mouth daily. 30 capsule 1   . gabapentin (NEURONTIN) 300 MG capsule Take 1 capsule (300 mg total) by mouth 3 (three) times daily. 90 capsule 1   . prazosin (MINIPRESS) 1 MG capsule Take 3 capsules (3 mg total) by mouth at bedtime. 30 capsule 1   . QUEtiapine (SEROQUEL) 300 MG tablet Take 1 tablet (300 mg total) by mouth at bedtime. 30 tablet 1   . traZODone (DESYREL) 150 MG tablet Take 1 tablet (150 mg total) by mouth at bedtime as needed for sleep. 30 tablet 1     Patient Stressors: Financial difficulties Marital or family conflict Medication change or noncompliance  Patient Strengths: Ability for insight Average or above average intelligence Communication skills General fund of knowledge  Treatment Modalities: Medication Management, Group therapy, Case management,  1 to 1 session with clinician, Psychoeducation, Recreational therapy.   Physician Treatment Plan for Primary Diagnosis: Severe recurrent major depression with psychotic features (HCC) Long Term Goal(s): Improvement in symptoms so as ready for discharge Improvement in symptoms so  as ready for discharge   Short Term Goals: Ability to identify changes in lifestyle to reduce recurrence of condition will improve Ability to verbalize feelings will improve Ability to disclose and discuss suicidal ideas Ability to demonstrate self-control will improve Ability to identify and develop effective coping behaviors will improve Ability to maintain clinical measurements within normal limits will improve Compliance with prescribed medications will improve Ability to identify triggers associated with substance abuse/mental health issues will improve Ability to identify changes in lifestyle to reduce recurrence of condition  will improve Ability to verbalize feelings will improve Ability to disclose and discuss suicidal ideas Ability to demonstrate self-control will improve Ability to identify and develop effective coping behaviors will improve Ability to maintain clinical measurements within normal limits will improve Compliance with prescribed medications will improve Ability to identify triggers associated with substance abuse/mental health issues will improve  Medication Management: Evaluate patient's response, side effects, and tolerance of medication regimen.  Therapeutic Interventions: 1 to 1 sessions, Unit Group sessions and Medication administration.  Evaluation of Outcomes: Progressing  Physician Treatment Plan for Secondary Diagnosis: Principal Problem:   Severe recurrent major depression with psychotic features (HCC) Active Problems:   Post traumatic stress disorder (PTSD)   Cocaine use disorder (HCC)   Alcohol use disorder, severe, dependence (HCC)   HTN (hypertension)   Neuropathic pain  Long Term Goal(s): Improvement in symptoms so as ready for discharge Improvement in symptoms so as ready for discharge   Short Term Goals: Ability to identify changes in lifestyle to reduce recurrence of condition will improve Ability to verbalize feelings will improve Ability to disclose and discuss suicidal ideas Ability to demonstrate self-control will improve Ability to identify and develop effective coping behaviors will improve Ability to maintain clinical measurements within normal limits will improve Compliance with prescribed medications will improve Ability to identify triggers associated with substance abuse/mental health issues will improve Ability to identify changes in lifestyle to reduce recurrence of condition will improve Ability to verbalize feelings will improve Ability to disclose and discuss suicidal ideas Ability to demonstrate self-control will improve Ability to identify and  develop effective coping behaviors will improve Ability to maintain clinical measurements within normal limits will improve Compliance with prescribed medications will improve Ability to identify triggers associated with substance abuse/mental health issues will improve     Medication Management: Evaluate patient's response, side effects, and tolerance of medication regimen.  Therapeutic Interventions: 1 to 1 sessions, Unit Group sessions and Medication administration.  Evaluation of Outcomes: Progressing   RN Treatment Plan for Primary Diagnosis: Severe recurrent major depression with psychotic features (HCC) Long Term Goal(s): Knowledge of disease and therapeutic regimen to maintain health will improve  Short Term Goals: Ability to remain free from injury will improve, Ability to verbalize frustration and anger appropriately will improve, Ability to demonstrate self-control, Ability to participate in decision making will improve, Ability to verbalize feelings will improve, Ability to disclose and discuss suicidal ideas, Ability to identify and develop effective coping behaviors will improve and Compliance with prescribed medications will improve  Medication Management: RN will administer medications as ordered by provider, will assess and evaluate patient's response and provide education to patient for prescribed medication. RN will report any adverse and/or side effects to prescribing provider.  Therapeutic Interventions: 1 on 1 counseling sessions, Psychoeducation, Medication administration, Evaluate responses to treatment, Monitor vital signs and CBGs as ordered, Perform/monitor CIWA, COWS, AIMS and Fall Risk screenings as ordered, Perform wound care treatments as ordered.  Evaluation  of Outcomes: Progressing   LCSW Treatment Plan for Primary Diagnosis: Severe recurrent major depression with psychotic features (HCC) Long Term Goal(s): Safe transition to appropriate next level of care at  discharge, Engage patient in therapeutic group addressing interpersonal concerns.  Short Term Goals: Engage patient in aftercare planning with referrals and resources, Increase social support, Increase ability to appropriately verbalize feelings, Increase emotional regulation, Facilitate patient progression through stages of change regarding substance use diagnoses and concerns, Identify triggers associated with mental health/substance abuse issues and Increase skills for wellness and recovery  Therapeutic Interventions: Assess for all discharge needs, 1 to 1 time with Social worker, Explore available resources and support systems, Assess for adequacy in community support network, Educate family and significant other(s) on suicide prevention, Complete Psychosocial Assessment, Interpersonal group therapy.  Evaluation of Outcomes: Progressing   Progress in Treatment: Attending groups: No. Participating in groups: No. Taking medication as prescribed: Yes. Toleration medication: Yes. Family/Significant other contact made: No, will contact:  Patient declined  Patient understands diagnosis: Yes. Discussing patient identified problems/goals with staff: Yes. Medical problems stabilized or resolved: Yes. Denies suicidal/homicidal ideation: Yes. Issues/concerns per patient self-inventory: No. Other: None   New problem(s) identified: No, Describe:  None  New Short Term/Long Term Goal(s):  Patient Goals:    Discharge Plan or Barriers:   Reason for Continuation of Hospitalization: Hallucinations Medical Issues Medication stabilization Suicidal ideation  Estimated Length of Stay: TBD  Attendees: Patient: 04/05/2020 1:29 PM  Physician: Dr. Reita May, MD  04/05/2020 1:29 PM  Nursing:  04/05/2020 1:29 PM  RN Care Manager: 04/05/2020 1:29 PM  Social Worker: Susa Simmonds, LCSWA 04/05/2020 1:29 PM  Recreational Therapist:  04/05/2020 1:29 PM  Other:  04/05/2020 1:29 PM  Other:  04/05/2020 1:29 PM   Other: 04/05/2020 1:29 PM    Scribe for Treatment Team: Susa Simmonds, LCSWA 04/05/2020 1:29 PM

## 2020-04-06 NOTE — [facility] Counselor (Signed)
CSW met with patient while completing rounds. Patient provided opportunity to communicate social work needs. Patient is interested in residential substance use treatment, CSW provided update on referrals. Patient interested in [facility] in [place], [place]. Agency is not open at this time, CSW will follow up at a later time.   Signed:  Durenda Hurt, MSW, [place], LCASA 04/06/2020 1:04 PM

## 2020-04-06 NOTE — Progress Notes (Signed)
Patient denies SI, HI, and AVH. She is complaining of severe left hip pain and was given Tylenol and ibuprofen throughout the day. Patient has been isolative to her room and stays in bed, coming out only for meals.   Medications were given per MD orders. Support and encouragement is provided. Patient remains safe on the unit at this time and q15 min safety checks are maintained.

## 2020-04-06 NOTE — Progress Notes (Signed)
[facility] MD Progress Note  04/06/2020 9:18 AM Rachel Nolan  MRN:  [id number]  Principal Problem: Severe recurrent major depression with psychotic features (HCC) Diagnosis: Principal Problem:   Severe recurrent major depression with psychotic features (HCC) Active Problems:   Post traumatic stress disorder (PTSD)   Cocaine use disorder (HCC)   Alcohol use disorder, severe, dependence (HCC)   HTN (hypertension)   Neuropathic pain  CC "okay"   Subjective:   Rachel Nolan was seen for initial psychiatric evaluation by Dr. Neale Burly on March 26, 2020 due to suicidal and homicidal ideations and the concepts of illicit drug use. Seroquel was increased to 400 mg and Minipress to 3 mg. She is taking her medications as prescribed.  3/20 Seen resting comfortably in her bed. States that she experiences left hip pain when she puts pressure on it. A x-ray was done on April 03, 2020 that showed severe degeneration of joint disease. She has not sustained any falls. Reports feeling a bit hopeless, fearing that she might end up homeless again if placement cannot be found. Sleep was fair last night. No safety concerns. Tolerating medication as well.  3/19 Today, she was seen reading in her bed this morning. Denies having any cravings for drugs. Denies depression or anxiety. Says that she tossed and turned last night in her sleep. Still reports mild to moderate nightmares but feels that medications are helping. Eating well. Has not had a bowel movement in three days.  Total Time spent with patient: 25 minutes  Past Psychiatric History: see H&P  Past Medical History:  Past Medical History:  Diagnosis Date  . Anxiety   . Asthma   . Depression   . Hypertension   . Scoliosis     Past Surgical History:  Procedure Laterality Date  . BACK SURGERY    . ECTOPIC PREGNANCY SURGERY     Family History:  Family History  Problem Relation Age of Onset  . Hypertension Mother   . Hypertension Father    Family  Psychiatric  History: see H&P Social History:  Social History   Substance and Sexual Activity  Alcohol Use Yes   Comment: drinks every other day     Social History   Substance and Sexual Activity  Drug Use Yes  . Types: Marijuana, Cocaine, "Crack" cocaine   Comment: Currently using crack    Social History   Socioeconomic History  . Marital status: Widowed    Spouse name: Not on file  . Number of children: Not on file  . Years of education: Not on file  . Highest education level: Not on file  Occupational History  . Not on file  Tobacco Use  . Smoking status: Current Every Day Smoker    Packs/day: 0.50    Types: Cigarettes  . Smokeless tobacco: Never Used  Vaping Use  . Vaping Use: Never used  Substance and Sexual Activity  . Alcohol use: Yes    Comment: drinks every other day  . Drug use: Yes    Types: Marijuana, Cocaine, "Crack" cocaine    Comment: Currently using crack  . Sexual activity: Not Currently  Other Topics Concern  . Not on file  Social History Narrative   Pt is homeless, no fixed address; not followed by an outpatient psychiatrist   Social Determinants of Health   Financial Resource Strain: Not on file  Food Insecurity: Not on file  Transportation Needs: Not on file  Physical Activity: Not on file  Stress: Not on file  Social Connections: Not on file   Additional Social History:    Sleep: Fair  Appetite:  Good  Current Medications: Current Facility-Administered Medications  Medication Dose Route Frequency Provider Last Rate Last Admin  . acetaminophen (TYLENOL) tablet 650 mg  650 mg Oral Q6H PRN Clapacs, Jackquline Denmark, MD   650 mg at 04/06/20 0749  . albuterol (VENTOLIN HFA) 108 (90 Base) MCG/ACT inhaler 1-2 puff  1-2 puff Inhalation TID PRN Thalia Party, MD   2 puff at 04/01/20 2115  . alum & mag hydroxide-simeth (MAALOX/MYLANTA) 200-200-20 MG/5ML suspension 30 mL  30 mL Oral Q4H PRN Clapacs, John T, MD      . amLODipine (NORVASC) tablet 10 mg   10 mg Oral Daily Clapacs, Jackquline Denmark, MD   10 mg at 04/06/20 0749  . FLUoxetine (PROZAC) capsule 60 mg  60 mg Oral Daily Jesse Sans, MD   60 mg at 04/06/20 0749  . gabapentin (NEURONTIN) capsule 600 mg  600 mg Oral TID Jesse Sans, MD   600 mg at 04/06/20 0749  . ibuprofen (ADVIL) tablet 400 mg  400 mg Oral Q4H PRN Jesse Sans, MD   400 mg at 04/05/20 2119  . magnesium hydroxide (MILK OF MAGNESIA) suspension 30 mL  30 mL Oral Daily PRN Clapacs, John T, MD      . prazosin (MINIPRESS) capsule 3 mg  3 mg Oral QHS Jesse Sans, MD   3 mg at 04/05/20 2115  . psyllium (HYDROCIL/METAMUCIL) 1 packet  1 packet Oral Daily Reggie Pile, MD   1 packet at 04/06/20 [phone]  . QUEtiapine (SEROQUEL) tablet 400 mg  400 mg Oral QHS Jesse Sans, MD   400 mg at 04/05/20 2115  . traZODone (DESYREL) tablet 150 mg  150 mg Oral QHS Clapacs, Jackquline Denmark, MD   150 mg at 04/05/20 2114    Lab Results: No results found for this or any previous visit (from the past 48 hour(s)).  Blood Alcohol level:  Lab Results  Component Value Date   ETH <10 03/24/2020   ETH <10 01/22/2020    Metabolic Disorder Labs: Lab Results  Component Value Date   HGBA1C 5.5 01/24/2020   MPG 111.15 01/24/2020   MPG 105.41 12/07/2019   No results found for: PROLACTIN Lab Results  Component Value Date   CHOL 148 01/24/2020   TRIG 72 01/24/2020   HDL 67 01/24/2020   CHOLHDL 2.2 01/24/2020   VLDL 14 01/24/2020   LDLCALC 67 01/24/2020   LDLCALC 83 12/07/2019    Physical Findings: AIMS:  , ,  ,  ,    CIWA:    COWS:     Musculoskeletal: Strength & Muscle Tone: within normal limits Gait & Station: normal Patient leans: N/A   Physical Exam: Physical Exam Vitals reviewed.    ROS Blood pressure 115/79, pulse 88, temperature 98.1 F (36.7 C), temperature source Oral, resp. rate 18, height 5\' 6"  (1.676 m), weight 84.4 kg, last menstrual period 06/19/2018, SpO2 100 %. Body mass index is 30.02 kg/m.   Psychiatric  Specialty Exam:  Presentation  General Appearance: Casual  Eye Contact: fair  Speech:Normal Rate  Speech Volume:Normal  Handedness:Right   Mood and Affect  Mood:alright  Affect: neutral   Thought Process  Thought Processes:Coherent  Descriptions of Associations:Intact  Orientation:Full (Time, Place and Person)  Thought Content:Perseveration  History of Schizophrenia/Schizoaffective disorder:No  Duration of Psychotic Symptoms:Less than six months  Hallucinations: denies today  Ideas of Reference:None  Suicidal Thoughts: denies today  Homicidal Thoughts:Homicidal Thoughts: No   Sensorium  Memory:Immediate Fair  Judgment:good  Insight:Fair   Executive Functions  Concentration:Fair  Attention Span:Fair  Recall:Fair  Fund of Knowledge:Fair  Language:Fair   Psychomotor Activity  Psychomotor Activity:Psychomotor Activity: Decreased   Assets  Assets:Communication Skills; Desire for Improvement   Sleep  Sleep:Sleep: Fair    Treatment Plan Summary: Daily contact with patient to assess and evaluate symptoms and progress in treatment and Medication management  Patient is a 50 year old female/female with the above-stated past psychiatric history who is seen in follow-up.  Chart reviewed. Patient discussed with nursing. Patient denies suicidal ideations, homicidal ideations, visual hallucinations, or auditory hallucinations. She denies nightmares.    Plan:  -continue inpatient psych admission; 15-minute checks; daily contact with patient to assess and evaluate symptoms and progress in treatment; psychoeducation.  -continue scheduled medications: 1)MDD, recurrent, severe with psychotic features- established problem, improved - Continue Seroquel 400 mg nightly for mood and hallucinations, continue  fluoxetine 60 mg daily, trazodone 150 mg QHS for insomnia  2) PTSD, chronic- established problem, improving -  Prozac as above, continue prazosin 3 mg QHS for nightmares  3) HTN- established problem. stable - Amlodipine 10 mg daily  4) Neuropathic pain  - Continue gabapentin to 600 mg TID   -continue PRN medications.  acetaminophen, albuterol, alum & mag hydroxide-simeth, ibuprofen, magnesium hydroxide  -Pertinent Labs: no new labs ordered today    -Consults: No new consults placed since yesterday    -Disposition: All necessary aftercare will be arranged prior to discharge Likely d/c to shelter with outpatient psych follow-up vs inpatient substance abuse treatment  -  I certify that the patient does need, on a daily basis, active treatment furnished directly by or requiring the supervision of inpatient psychiatric facility personnel.   04/04/20: Psychiatric exam above reviewed and remains accurate. Assessment and plan above reviewed and updated.   04/05/20 [place] x 1   04/06/20  CPT: [id number] Reggie Pile, MD 04/06/2020, 9:18 AM

## 2020-04-06 NOTE — Plan of Care (Signed)
  Problem: Education: Goal: Knowledge of [facility] Education information/materials will improve Outcome: Progressing Goal: Emotional status will improve Outcome: Progressing Goal: Mental status will improve Outcome: Progressing Goal: Verbalization of understanding the information provided will improve Outcome: Progressing   Problem: Activity: Goal: Interest or engagement in activities will improve Outcome: Progressing Goal: Sleeping patterns will improve Outcome: Progressing   Problem: Coping: Goal: Ability to verbalize frustrations and anger appropriately will improve Outcome: Progressing Goal: Ability to demonstrate self-control will improve Outcome: Progressing   Problem: Health Behavior/Discharge Planning: Goal: Identification of resources available to assist in meeting health care needs will improve Outcome: Progressing Goal: Compliance with treatment plan for underlying cause of condition will improve Outcome: Progressing   Problem: Physical Regulation: Goal: Ability to maintain clinical measurements within normal limits will improve Outcome: Progressing   Problem: Safety: Goal: Periods of time without injury will increase Outcome: Progressing   Problem: Education: Goal: Utilization of techniques to improve thought processes will improve Outcome: Progressing Goal: Knowledge of the prescribed therapeutic regimen will improve Outcome: Progressing   Problem: Activity: Goal: Interest or engagement in leisure activities will improve Outcome: Progressing Goal: Imbalance in normal sleep/wake cycle will improve Outcome: Progressing   Problem: Coping: Goal: Coping ability will improve Outcome: Progressing Goal: Will verbalize feelings Outcome: Progressing   Problem: Health Behavior/Discharge Planning: Goal: Ability to make decisions will improve Outcome: Progressing Goal: Compliance with therapeutic regimen will improve Outcome: Progressing    Problem: Role Relationship: Goal: Will demonstrate positive changes in social behaviors and relationships Outcome: Progressing   Problem: Safety: Goal: Ability to disclose and discuss suicidal ideas will improve Outcome: Progressing Goal: Ability to identify and utilize support systems that promote safety will improve Outcome: Progressing   Problem: Self-Concept: Goal: Will verbalize positive feelings about self Outcome: Progressing Goal: Level of anxiety will decrease Outcome: Progressing   Problem: Education: Goal: Ability to state activities that reduce stress will improve Outcome: Progressing   Problem: Coping: Goal: Ability to identify and develop effective coping behavior will improve Outcome: Progressing   Problem: Self-Concept: Goal: Ability to identify factors that promote anxiety will improve Outcome: Progressing Goal: Level of anxiety will decrease Outcome: Progressing Goal: Ability to modify response to factors that promote anxiety will improve Outcome: Progressing   Problem: Education: Goal: Ability to make informed decisions regarding treatment will improve Outcome: Progressing   Problem: Coping: Goal: Coping ability will improve Outcome: Progressing   Problem: Health Behavior/Discharge Planning: Goal: Identification of resources available to assist in meeting health care needs will improve Outcome: Progressing   Problem: Medication: Goal: Compliance with prescribed medication regimen will improve Outcome: Progressing   Problem: Self-Concept: Goal: Ability to disclose and discuss suicidal ideas will improve Outcome: Progressing Goal: Will verbalize positive feelings about self Outcome: Progressing   

## 2020-04-06 NOTE — Progress Notes (Signed)
Patient has been tearful, worrying about having a place to stay after discharge. Denies SI

## 2020-04-06 NOTE — BHH Counselor (Signed)
LCSW Group Therapy Note  04/06/2020 2:00 PM  Type of Therapy/Topic:  Group Therapy:  Feelings about Diagnosis  Participation Level:  Did Not Attend   Description of Group:   This group will allow patients to explore their thoughts and feelings about diagnoses they have received. Patients will be guided to explore their level of understanding and acceptance of these diagnoses. Facilitator will encourage patients to process their thoughts and feelings about the reactions of others to their diagnosis and will guide patients in identifying ways to discuss their diagnosis with significant others in their lives. This group will be process-oriented, with patients participating in exploration of their own experiences, giving and receiving support, and processing challenge from other group members.   Therapeutic Goals: 1. Patient will demonstrate understanding of diagnosis as evidenced by identifying two or more symptoms of the disorder 2. Patient will be able to express two feelings regarding the diagnosis 3. Patient will demonstrate their ability to communicate their needs through discussion and/or role play  Summary of Patient Progress: Patient did not attend group despite encouraged participation.     Therapeutic Modalities:   Cognitive Behavioral Therapy Brief Therapy Feelings Identification   Gwenevere Ghazi, MSW, [place], [place] 04/06/2020 2:00 PM

## 2020-04-07 ENCOUNTER — Inpatient Hospital Stay: Payer: 59

## 2020-04-07 ENCOUNTER — Encounter: Payer: Self-pay | Admitting: Psychiatry

## 2020-04-07 LAB — CBC WITH DIFFERENTIAL/PLATELET
Abs Immature Granulocytes: 0.02 10*3/uL (ref 0.00–0.07)
Basophils Absolute: 0 10*3/uL (ref 0.0–0.1)
Basophils Relative: 0 %
Eosinophils Absolute: 0.1 10*3/uL (ref 0.0–0.5)
Eosinophils Relative: 1 %
HCT: 34.4 % — ABNORMAL LOW (ref 36.0–46.0)
Hemoglobin: 11.5 g/dL — ABNORMAL LOW (ref 12.0–15.0)
Immature Granulocytes: 0 %
Lymphocytes Relative: 35 %
Lymphs Abs: 1.8 10*3/uL (ref 0.7–4.0)
MCH: 31.7 pg (ref 26.0–34.0)
MCHC: 33.4 g/dL (ref 30.0–36.0)
MCV: 94.8 fL (ref 80.0–100.0)
Monocytes Absolute: 0.7 10*3/uL (ref 0.1–1.0)
Monocytes Relative: 13 %
Neutro Abs: 2.7 10*3/uL (ref 1.7–7.7)
Neutrophils Relative %: 51 %
Platelets: 218 10*3/uL (ref 150–400)
RBC: 3.63 MIL/uL — ABNORMAL LOW (ref 3.87–5.11)
RDW: 14.8 % (ref 11.5–15.5)
WBC: 5.3 10*3/uL (ref 4.0–10.5)
nRBC: 0 % (ref 0.0–0.2)

## 2020-04-07 LAB — APTT: aPTT: 31 seconds (ref 24–36)

## 2020-04-07 LAB — COMPREHENSIVE METABOLIC PANEL
ALT: 77 U/L — ABNORMAL HIGH (ref 0–44)
AST: 58 U/L — ABNORMAL HIGH (ref 15–41)
Albumin: 3.4 g/dL — ABNORMAL LOW (ref 3.5–5.0)
Alkaline Phosphatase: 57 U/L (ref 38–126)
Anion gap: 6 (ref 5–15)
BUN: 15 mg/dL (ref 6–20)
CO2: 28 mmol/L (ref 22–32)
Calcium: 8.7 mg/dL — ABNORMAL LOW (ref 8.9–10.3)
Chloride: 102 mmol/L (ref 98–111)
Creatinine, Ser: 0.78 mg/dL (ref 0.44–1.00)
GFR, Estimated: >60 mL/min (ref >60)
Glucose, Bld: 119 mg/dL — ABNORMAL HIGH (ref 70–99)
Potassium: 4 mmol/L (ref 3.5–5.1)
Sodium: 136 mmol/L (ref 135–145)
Total Bilirubin: 0.5 mg/dL (ref 0.3–1.2)
Total Protein: 6.8 g/dL (ref 6.5–8.1)

## 2020-04-07 LAB — PROTIME-INR
INR: 1 (ref 0.8–1.2)
Prothrombin Time: 12.4 seconds (ref 11.4–15.2)

## 2020-04-07 LAB — D-DIMER, QUANTITATIVE: D-Dimer, Quant: 0.67 ug/mL-FEU — ABNORMAL HIGH (ref 0.00–0.50)

## 2020-04-07 LAB — FIBRINOGEN: Fibrinogen: 269 mg/dL (ref 210–475)

## 2020-04-07 IMAGING — CT CT ANGIO HEAD-NECK (W OR W/O PERF)
2 of 12 series · 6 of 33 positions shown · IV contrast (omnipaque)
Comparison: CT headache 15 7870

CLINICAL DATA: Decreased vision right eye.  Rule out stroke.

EXAM:
CT ANGIOGRAPHY HEAD AND NECK
TECHNIQUE: Multidetector CT imaging of the head and neck was performed using
the standard protocol during bolus administration of intravenous
contrast. Multiplanar CT image reconstructions and MIPs were
obtained to evaluate the vascular anatomy. Carotid stenosis
measurements (when applicable) are obtained utilizing NASCET
criteria, using the distal internal carotid diameter as the
denominator.
CONTRAST:  75mL OMNIPAQUE IOHEXOL 350 MG/ML SOLN

[Series 509: cta head neck thins · axial · 0.39mm/px · z∈[-313,-101]mm · 4 of 720 slices shown]
[im 144/720  soft-tissue]
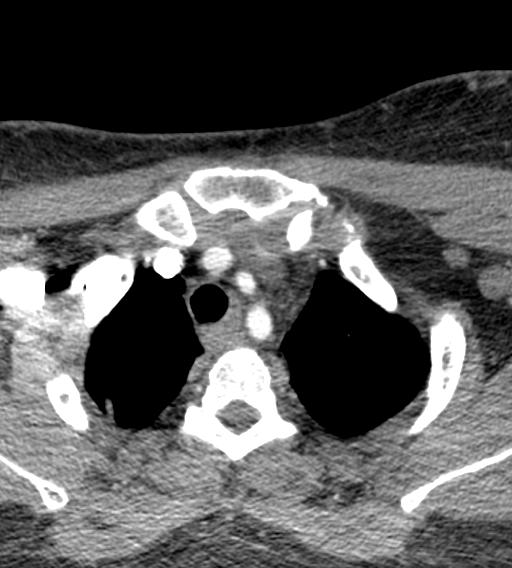
[im 288/720  bone]
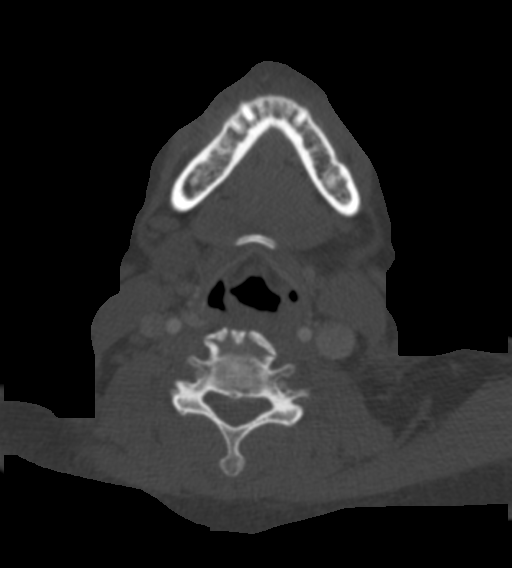
[im 432/720  soft-tissue]
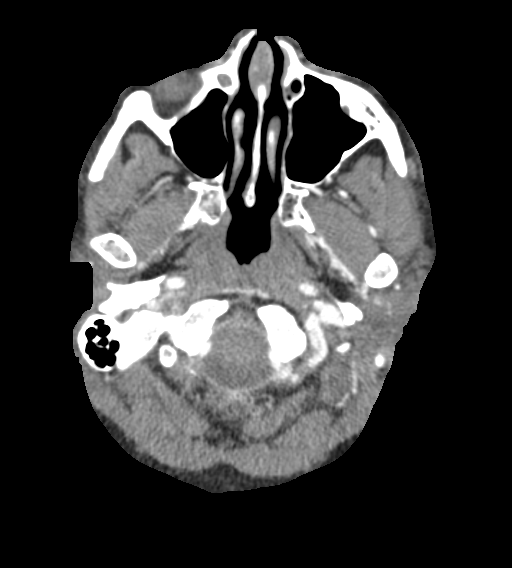
[im 576/720  bone]
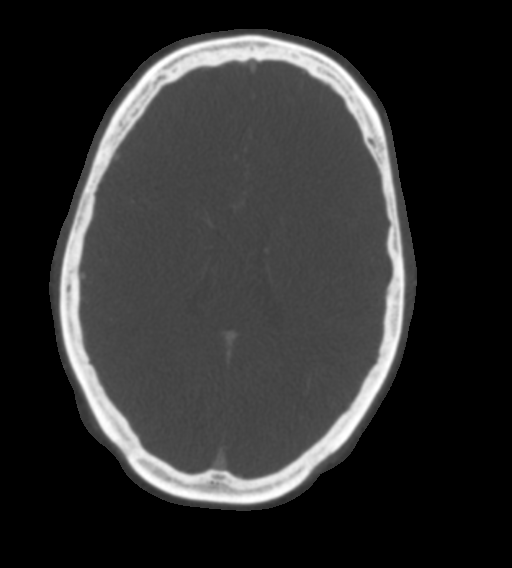

[Series 510: ax thin · axial · 0.39mm/px · z∈[-266,-149]mm · 2 of 360 slices shown]
[im 120/360  soft-tissue]
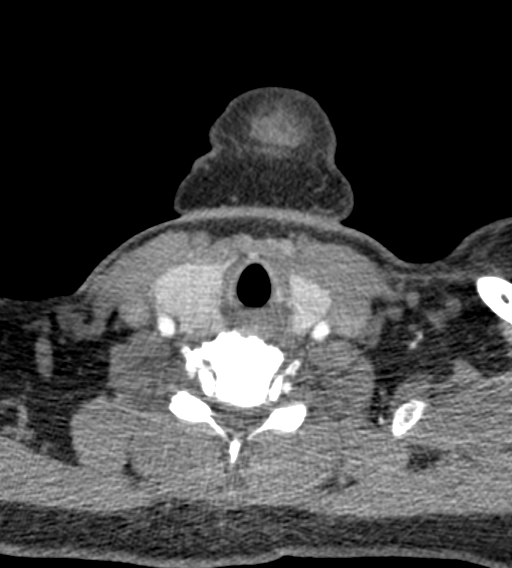
[im 240/360  soft-tissue]
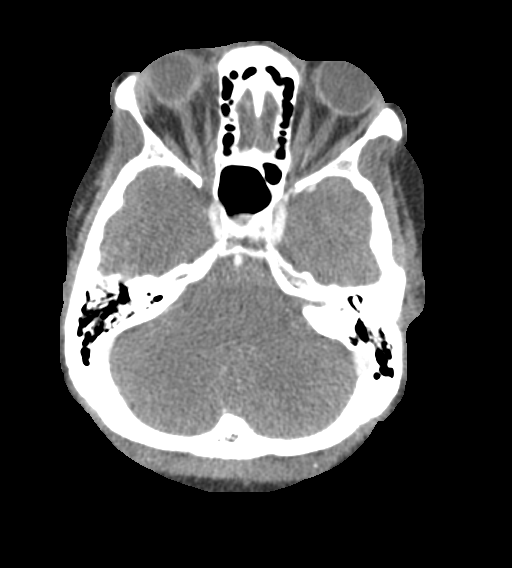

[6 of 33 positions shown; findings below may reference images not displayed]

FINDINGS: CT HEAD FINDINGS

Brain: No evidence of acute infarction, hemorrhage, hydrocephalus,
extra-axial collection or mass lesion/mass effect.

Vascular: Negative for hyperdense vessel

Skull: Negative

Sinuses: Paranasal sinuses clear.

Orbits: Negative

Review of the MIP images confirms the above findings

CTA NECK FINDINGS

Aortic arch: Normal aortic arch. Bovine branching arch. Proximal
great vessels normal.

Right carotid system: Normal right carotid. Negative for
atherosclerotic disease or stenosis

Left carotid system: Normal left carotid. Negative for
atherosclerotic disease or stenosis.

Vertebral arteries: Both vertebral arteries are normal and widely
patent to the basilar.

Skeleton: Cervical spondylosis.  No acute skeletal abnormality.

Other neck: Mild thyroid enlargement diffusely. No focal nodule. No
mass or adenopathy in the neck.

Upper chest: Extensive apical emphysema.  No acute abnormality.

Review of the MIP images confirms the above findings

CTA HEAD FINDINGS

Anterior circulation: Cavernous carotid widely patent bilaterally.
Anterior and middle cerebral arteries widely patent and normal
bilaterally.

Posterior circulation: Both vertebral arteries widely patent to the
basilar. Posterior circulation widely patent without stenosis or
large vessel occlusion.

Venous sinuses: Normal venous enhancement. Hypoplastic right
transverse and sigmoid sinus.

Anatomic variants: None

Review of the MIP images confirms the above findings
IMPRESSION: 1. Negative CT head.
2. Negative CT angio head and neck. No intracranial extracranial
stenosis.
3. These results were called by telephone at the time of
interpretation on 04/07/2020 at [DATE] to provider LORENZ JUMPER ,
who verbally acknowledged these results.

## 2020-04-07 MED ORDER — IOHEXOL 350 MG/ML SOLN
75.0000 mL | Freq: Once | INTRAVENOUS | Status: AC | PRN
  Administered 2020-04-07: 75 mL via INTRAVENOUS

## 2020-04-07 NOTE — [facility] Group Notes (Signed)
LCSW Group Therapy Note   04/07/2020 2:05 PM  Type of Therapy and Topic:  Group Therapy:  Overcoming Obstacles   Participation Level:  Did Not Attend   Description of Group:    In this group patients will be encouraged to explore what they see as obstacles to their own wellness and recovery. They will be guided to discuss their thoughts, feelings, and behaviors related to these obstacles. The group will process together ways to cope with barriers, with attention given to specific choices patients can make. Each patient will be challenged to identify changes they are motivated to make in order to overcome their obstacles. This group will be process-oriented, with patients participating in exploration of their own experiences as well as giving and receiving support and challenge from other group members.   Therapeutic Goals: 1. Patient will identify personal and current obstacles as they relate to admission. 2. Patient will identify barriers that currently interfere with their wellness or overcoming obstacles.  3. Patient will identify feelings, thought process and behaviors related to these barriers. 4. Patient will identify two changes they are willing to make to overcome these obstacles:      Summary of Patient Progress X   Therapeutic Modalities:   Cognitive Behavioral Therapy Solution Focused Therapy Motivational Interviewing Relapse Prevention Therapy  Dlynn Ranes R. Saidah Kempton, MSW, LCSW, LCAS 04/07/2020 2:05 PM    

## 2020-04-07 NOTE — Progress Notes (Signed)
Physical Therapy Treatment Patient Details Name: Rachel Nolan MRN: [id number] DOB: 1970-12-11 Today's Date: 04/07/2020    History of Present Illness Pt is a 50 year old female with major depressive disorder and PTSD presenting for worsening mood, suicidal ideations, and homicidal ideations.  PMH includes HTN, neuropathic pain, scoliosis, and back surgery.    PT Comments    Pt was pleasant and motivated to participate during the session and reported decreased L hip pain compared to prior sessions.  Pt endorsed self-limiting activity and spending much of the day in bed secondary to L hip pain with WB.  Pt education provided on benefits of activity and on PWB sequencing with the RW as needed to allow for increased activity tolerance.  Pt able to demonstrate PWB sequencing with good carryover and reported decreased L hip pain with that sequencing pattern.  Of note, pt reported that her R eye was medially deviated with decreased visual acuity that began the night of 04/06/20 while watching television, nursing notified. Pt will benefit from HHPT services upon discharge to safely address deficits listed in patient problem list for decreased caregiver assistance and eventual return to PLOF.     Follow Up Recommendations  Home health PT;Supervision - Intermittent     Equipment Recommendations  None recommended by PT    Recommendations for Other Services       Precautions / Restrictions Precautions Precautions: None Restrictions Weight Bearing Restrictions: No    Mobility  Bed Mobility Overal bed mobility: Independent                  Transfers Overall transfer level: Modified independent Equipment used: Rolling walker (2 wheeled)             General transfer comment: Good eccentric and concentric control  Ambulation/Gait Ambulation/Gait assistance: Supervision Gait Distance (Feet): 30 Feet Assistive device: Rolling walker (2 wheeled) Gait Pattern/deviations:  Decreased step length - right;Decreased step length - left;Trunk flexed;Step-to pattern Gait velocity: decreased   General Gait Details: PWB step-to pattern education provided for when needed secondary to L hip pain with WB   Stairs             Wheelchair Mobility    Modified Rankin (Stroke Patients Only)       Balance Overall balance assessment: Needs assistance   Sitting balance-Leahy Scale: Normal     Standing balance support: During functional activity;Bilateral upper extremity supported Standing balance-Leahy Scale: Good                              Cognition Arousal/Alertness: Awake/alert Behavior During Therapy: WFL for tasks assessed/performed Overall Cognitive Status: Within Functional Limits for tasks assessed                                        Exercises Other Exercises Other Exercises: Pt education provided on physiological benefits of activity and general principles of activity progression    General Comments        Pertinent Vitals/Pain Pain Assessment: No/denies pain Pain Score: 7  Pain Location: L hip Pain Descriptors / Indicators: Aching;Sore Pain Intervention(s): Monitored during session;Premedicated before session    Home Living                      Prior Function  PT Goals (current goals can now be found in the care plan section) Progress towards PT goals: Progressing toward goals    Frequency           PT Plan Current plan remains appropriate    Co-evaluation              AM-PAC PT "6 Clicks" Mobility   Outcome Measure  Help needed turning from your back to your side while in a flat bed without using bedrails?: None Help needed moving from lying on your back to sitting on the side of a flat bed without using bedrails?: None Help needed moving to and from a bed to a chair (including a wheelchair)?: None Help needed standing up from a chair using your arms (e.g.,  wheelchair or bedside chair)?: None Help needed to walk in hospital room?: A Little Help needed climbing 3-5 steps with a railing? : A Little 6 Click Score: 22    End of Session   Activity Tolerance: Patient limited by pain Patient left: in bed Nurse Communication: Mobility status;Other (comment) (Pt reported having blurred vision in her R eye along with eye medially deviated) PT Visit Diagnosis: Difficulty in walking, not elsewhere classified (R26.2);Muscle weakness (generalized) (M62.81);Pain Pain - Right/Left: Left Pain - part of body: Hip;Leg     Time: 2637-8588 PT Time Calculation (min) (ACUTE ONLY): 15 min  Charges:  $Gait Training: 8-22 mins                     D. Scott Tanner PT, DPT 04/07/20, 2:47 PM

## 2020-04-07 NOTE — Progress Notes (Signed)
Alerted by nursing staff that patient reported a sharp pain in her head followed by inability to see out of her right eye. She states that the pain in her head began around 9:30PM, and she noted the pain resolved and she could no longer see out of her right eye, and her eye was deviated inward. She did not alert staff until 2:45PM today. Contacted Dr. Iver Nestle, neurologist on call, who recommended a state CT angiogram of head and neck due to concern for aneurysm requiring neurosurgery interventions. Also ordered state CBC, CMP, PT, PTT, INR, d-dimer, fibrinogen, and EKG. Awaiting results at this time, direct cell phone number provided to radiology team.

## 2020-04-07 NOTE — Progress Notes (Signed)
Recreation Therapy Notes  Date: 04/07/2020  Time: 9:30 am   Location: Craft room     Behavioral response: N/A   Intervention Topic: Social-Skills   Discussion/Intervention: Patient did not attend group.   Clinical Observations/Feedback:  Patient did not attend group.   Rachel Nolan LRT/CTRS        Rachel  Nolan 04/07/2020 11:45 AM

## 2020-04-07 NOTE — Progress Notes (Signed)
Patient is calm and cooperative. She denies SI, HI, and AVH. Her main complaint is left hip pain that shoots down her leg. Patient is anxious about not having a place to go upon discharge.  Medications are given per MD orders. Support and encouragement is provided. Patient remains safe on the unit at this time and q15 min safety checks are maintained.

## 2020-04-07 NOTE — Progress Notes (Signed)
Call received from radiology. CT angio head and neck negative for any acute intracranial process

## 2020-04-07 NOTE — BHH Counselor (Signed)
CSW followed up with [facility] and [facility] referrals.    Patient is under review at [facility].  [facility] is not accepting patient's until mid-April due to moving facilities.  Penni Homans, MSW, LCSW 04/07/2020 9:57 AM

## 2020-04-07 NOTE — Progress Notes (Signed)
Patient reports that last night, she had a sharp pain in her head that quickly dissipated. Upon assessment, patient states she cannot see out of her right eye and it appears her right eye is deviated. MD notified and patient was brought to CT.
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
## 2020-04-07 NOTE — BHH Counselor (Signed)
CSW attempted to contact [facility] ([facility] [phone]) regarding referral. HIPPA complaint voicemail left with contact information for follow through.   Vilma Meckel. Algis Greenhouse, MSW, LCSW, LCAS 04/07/2020 10:09 AM

## 2020-04-07 NOTE — BHH Group Notes (Incomplete)
LCSW Group Therapy Note     04/07/2020 10:32 AM     Type of Therapy and Topic:  Group Therapy:  Overcoming Obstacles     Participation Level:  {BHH PARTICIPATION FUXNA:[id number]}     Description of Group:     In this group patients will be encouraged to explore what they see as obstacles to their own wellness and recovery. They will be guided to discuss their thoughts, feelings, and behaviors related to these obstacles. The group will process together ways to cope with barriers, with attention given to specific choices patients can make. Each patient will be challenged to identify changes they are motivated to make in order to overcome their obstacles. This group will be process-oriented, with patients participating in exploration of their own experiences as well as giving and receiving support and challenge from other group members.     Therapeutic Goals:  1.    Patient will identify personal and current obstacles as they relate to admission.  2.    Patient will identify barriers that currently interfere with their wellness or overcoming obstacles.  3.    Patient will identify feelings, thought process and behaviors related to these barriers.  4.    Patient will identify two changes they are willing to make to overcome these obstacles:        Summary of Patient Progress:      Therapeutic Modalities:    Cognitive Behavioral Therapy  Solution Focused Therapy  Motivational Interviewing  Relapse Prevention Therapy     Kiva [place], MSW, LCSW-A  04/07/2020 10:32 AM

## 2020-04-07 NOTE — Progress Notes (Signed)
[facility] MD Progress Note  04/07/2020 10:24 AM Rachel Nolan  MRN:  [id number]  Principal Problem: Severe recurrent major depression with psychotic features (HCC) Diagnosis: Principal Problem:   Severe recurrent major depression with psychotic features (HCC) Active Problems:   Post traumatic stress disorder (PTSD)   Cocaine use disorder (HCC)   Alcohol use disorder, severe, dependence (HCC)   HTN (hypertension)   Neuropathic pain  CC "Upset today"  Rachel Nolan is a 50y.o. female with major depressive disorder and PTSD presenting for worsening mood, suicidal ideations, and homicidal ideations.   Interval History Patient was seen today for re-evaluation.  Nursing reports no events overnight. The patient has no issues with performing ADLs.  Patient has been medication compliant.    Subjective:  On assessment patient notes that she is feeling okay today, but remains very anxious about where she will go when she leaves the hospital. She remains under review with [facility] and [facility] for substance abuse treatments. All homeless shelters remain full at this time. She is on waiting list for bottom bunk bed at [facility] shelter. She does not have family she can stay with at this time.  She denies suicidal ideations, homicidal ideations, visual hallucinations, and auditory hallucinations. She denies nightmares, she denies medication side effects.    Total Time spent with patient: 20 minutes  Past Psychiatric History: see H&P  Past Medical History:  Past Medical History:  Diagnosis Date  . Anxiety   . Asthma   . Depression   . Hypertension   . Scoliosis     Past Surgical History:  Procedure Laterality Date  . BACK SURGERY    . ECTOPIC PREGNANCY SURGERY     Family History:  Family History  Problem Relation Age of Onset  . Hypertension Mother   . Hypertension Father    Family Psychiatric  History: see H&P Social History:  Social History   Substance and Sexual Activity   Alcohol Use Yes   Comment: drinks every other day     Social History   Substance and Sexual Activity  Drug Use Yes  . Types: Marijuana, Cocaine, "Crack" cocaine   Comment: Currently using crack    Social History   Socioeconomic History  . Marital status: Widowed    Spouse name: Not on file  . Number of children: Not on file  . Years of education: Not on file  . Highest education level: Not on file  Occupational History  . Not on file  Tobacco Use  . Smoking status: Current Every Day Smoker    Packs/day: 0.50    Types: Cigarettes  . Smokeless tobacco: Never Used  Vaping Use  . Vaping Use: Never used  Substance and Sexual Activity  . Alcohol use: Yes    Comment: drinks every other day  . Drug use: Yes    Types: Marijuana, Cocaine, "Crack" cocaine    Comment: Currently using crack  . Sexual activity: Not Currently  Other Topics Concern  . Not on file  Social History Narrative   Pt is homeless, no fixed address; not followed by an outpatient psychiatrist   Social Determinants of Health   Financial Resource Strain: Not on file  Food Insecurity: Not on file  Transportation Needs: Not on file  Physical Activity: Not on file  Stress: Not on file  Social Connections: Not on file   Additional Social History:    Sleep: Fair  Appetite:  Good  Current Medications: Current Facility-Administered Medications  Medication Dose Route  Frequency Provider Last Rate Last Admin  . acetaminophen (TYLENOL) tablet 650 mg  650 mg Oral Q6H PRN Clapacs, Jackquline Denmark, MD   650 mg at 04/07/20 0748  . albuterol (VENTOLIN HFA) 108 (90 Base) MCG/ACT inhaler 1-2 puff  1-2 puff Inhalation TID PRN Thalia Party, MD   2 puff at 04/06/20 2126  . alum & mag hydroxide-simeth (MAALOX/MYLANTA) 200-200-20 MG/5ML suspension 30 mL  30 mL Oral Q4H PRN Clapacs, John T, MD      . amLODipine (NORVASC) tablet 10 mg  10 mg Oral Daily Clapacs, Jackquline Denmark, MD   10 mg at 04/07/20 0748  . FLUoxetine (PROZAC) capsule 60  mg  60 mg Oral Daily Jesse Sans, MD   60 mg at 04/07/20 0748  . gabapentin (NEURONTIN) capsule 600 mg  600 mg Oral TID Jesse Sans, MD   600 mg at 04/07/20 0747  . ibuprofen (ADVIL) tablet 400 mg  400 mg Oral Q4H PRN Jesse Sans, MD   400 mg at 04/06/20 1115  . magnesium hydroxide (MILK OF MAGNESIA) suspension 30 mL  30 mL Oral Daily PRN Clapacs, John T, MD      . prazosin (MINIPRESS) capsule 3 mg  3 mg Oral QHS Jesse Sans, MD   3 mg at 04/06/20 2128  . psyllium (HYDROCIL/METAMUCIL) 1 packet  1 packet Oral Daily Reggie Pile, MD   1 packet at 04/07/20 [phone]  . QUEtiapine (SEROQUEL) tablet 400 mg  400 mg Oral QHS Jesse Sans, MD   400 mg at 04/06/20 2127  . traZODone (DESYREL) tablet 150 mg  150 mg Oral QHS Clapacs, Jackquline Denmark, MD   150 mg at 04/06/20 2127    Lab Results: No results found for this or any previous visit (from the past 48 hour(s)).  Blood Alcohol level:  Lab Results  Component Value Date   ETH <10 03/24/2020   ETH <10 01/22/2020    Metabolic Disorder Labs: Lab Results  Component Value Date   HGBA1C 5.5 01/24/2020   MPG 111.15 01/24/2020   MPG 105.41 12/07/2019   No results found for: PROLACTIN Lab Results  Component Value Date   CHOL 148 01/24/2020   TRIG 72 01/24/2020   HDL 67 01/24/2020   CHOLHDL 2.2 01/24/2020   VLDL 14 01/24/2020   LDLCALC 67 01/24/2020   LDLCALC 83 12/07/2019    Physical Findings: AIMS:  , ,  ,  ,    CIWA:    COWS:     Musculoskeletal: Strength & Muscle Tone: within normal limits Gait & Station: normal Patient leans: N/A   Physical Exam: Physical Exam Vitals reviewed.    ROS  Blood pressure 104/76, pulse 81, temperature 97.9 F (36.6 C), temperature source Oral, resp. rate 18, height 5\' 6"  (1.676 m), weight 84.4 kg, last menstrual period 06/19/2018, SpO2 99 %. Body mass index is 30.02 kg/m.   Psychiatric Specialty Exam:  Presentation  General Appearance: Casual  Eye Contact:  fair  Speech:Normal Rate  Speech Volume:Normal  Handedness:Right   Mood and Affect  Mood:Anxious  Affect:Congruent; Constricted   Thought Process  Thought Processes:Coherent  Descriptions of Associations:Intact  Orientation:Full (Time, Place and Person)  Thought Content:Perseveration  History of Schizophrenia/Schizoaffective disorder:No  Duration of Psychotic Symptoms:Less than six months  Hallucinations: denies today  Ideas of Reference:None  Suicidal Thoughts: denies today  Homicidal Thoughts:Homicidal Thoughts: No   Sensorium  Memory:Immediate Fair  Judgment:Fair  Insight:Fair   Executive Functions  Concentration:Fair  Attention  Span:Fair  Recall:Fair  Fund of Knowledge:Fair  Language:Fair   Psychomotor Activity  Psychomotor Activity:Psychomotor Activity: Decreased   Assets  Assets:Communication Skills; Desire for Improvement   Sleep  Sleep:Sleep: Fair    Treatment Plan Summary: Daily contact with patient to assess and evaluate symptoms and progress in treatment and Medication management  Patient is a 50 year old female/female with the above-stated past psychiatric history who is seen in follow-up.  Chart reviewed. Patient discussed with nursing. Patient denies suicidal ideations, homicidal ideations, visual hallucinations, or auditory hallucinations. She denies nightmares.    Plan:  -continue inpatient psych admission; 15-minute checks; daily contact with patient to assess and evaluate symptoms and progress in treatment; psychoeducation.  -continue scheduled medications: 1)MDD, recurrent, severe with psychotic features- established problem, improved - Continue Seroquel 400 mg nightly for mood and hallucinations, continue  fluoxetine 60 mg daily, trazodone 150 mg QHS for insomnia  2) PTSD, chronic- established problem, improving - Prozac as above, continue prazosin 3 mg QHS for nightmares  3) HTN-  established problem. stable - Amlodipine 10 mg daily  4) Neuropathic pain  - Continue gabapentin to 600 mg TID   -continue PRN medications.  acetaminophen, albuterol, alum & mag hydroxide-simeth, ibuprofen, magnesium hydroxide  -Pertinent Labs: no new labs ordered today    -Consults: No new consults placed since yesterday    -Disposition: All necessary aftercare will be arranged prior to discharge Likely d/c to shelter with outpatient psych follow-up vs inpatient substance abuse treatment  -  I certify that the patient does need, on a daily basis, active treatment furnished directly by or requiring the supervision of inpatient psychiatric facility personnel.   04/07/20 Psychiatric exam above reviewed and remains accurate. Assessment and plan above reviewed and updated.     Jesse Sans, MD 04/07/2020, 10:24 AM

## 2020-04-07 NOTE — Plan of Care (Signed)
  Problem: Education: Goal: Knowledge of [facility] Education information/materials will improve Outcome: Progressing Goal: Emotional status will improve Outcome: Progressing Goal: Mental status will improve Outcome: Progressing Goal: Verbalization of understanding the information provided will improve Outcome: Progressing   Problem: Activity: Goal: Interest or engagement in activities will improve Outcome: Progressing Goal: Sleeping patterns will improve Outcome: Progressing   Problem: Coping: Goal: Ability to verbalize frustrations and anger appropriately will improve Outcome: Progressing Goal: Ability to demonstrate self-control will improve Outcome: Progressing   Problem: Health Behavior/Discharge Planning: Goal: Identification of resources available to assist in meeting health care needs will improve Outcome: Progressing Goal: Compliance with treatment plan for underlying cause of condition will improve Outcome: Progressing   Problem: Physical Regulation: Goal: Ability to maintain clinical measurements within normal limits will improve Outcome: Progressing   Problem: Safety: Goal: Periods of time without injury will increase Outcome: Progressing   Problem: Education: Goal: Utilization of techniques to improve thought processes will improve Outcome: Progressing Goal: Knowledge of the prescribed therapeutic regimen will improve Outcome: Progressing   Problem: Activity: Goal: Interest or engagement in leisure activities will improve Outcome: Progressing Goal: Imbalance in normal sleep/wake cycle will improve Outcome: Progressing   Problem: Coping: Goal: Coping ability will improve Outcome: Progressing Goal: Will verbalize feelings Outcome: Progressing   Problem: Health Behavior/Discharge Planning: Goal: Ability to make decisions will improve Outcome: Progressing Goal: Compliance with therapeutic regimen will improve Outcome: Progressing    Problem: Role Relationship: Goal: Will demonstrate positive changes in social behaviors and relationships Outcome: Progressing   Problem: Safety: Goal: Ability to disclose and discuss suicidal ideas will improve Outcome: Progressing Goal: Ability to identify and utilize support systems that promote safety will improve Outcome: Progressing   Problem: Self-Concept: Goal: Will verbalize positive feelings about self Outcome: Progressing Goal: Level of anxiety will decrease Outcome: Progressing   Problem: Education: Goal: Ability to state activities that reduce stress will improve Outcome: Progressing   Problem: Coping: Goal: Ability to identify and develop effective coping behavior will improve Outcome: Progressing   Problem: Self-Concept: Goal: Ability to identify factors that promote anxiety will improve Outcome: Progressing Goal: Level of anxiety will decrease Outcome: Progressing Goal: Ability to modify response to factors that promote anxiety will improve Outcome: Progressing   Problem: Education: Goal: Ability to make informed decisions regarding treatment will improve Outcome: Progressing   Problem: Coping: Goal: Coping ability will improve Outcome: Progressing   Problem: Health Behavior/Discharge Planning: Goal: Identification of resources available to assist in meeting health care needs will improve Outcome: Progressing   Problem: Medication: Goal: Compliance with prescribed medication regimen will improve Outcome: Progressing   Problem: Self-Concept: Goal: Ability to disclose and discuss suicidal ideas will improve Outcome: Progressing Goal: Will verbalize positive feelings about self Outcome: Progressing   

## 2020-04-07 NOTE — Progress Notes (Signed)
Patient has been pleasant and cooperative. Denies SI, HI and AVH 

## 2020-04-08 LAB — FACTOR 2 ASSAY: Factor II Activity: 114 % (ref 50–154)

## 2020-04-08 NOTE — Progress Notes (Signed)
Patient calm and compliant during assessment denying SI/HI/AVH. Patient observed interacting appropriately with staff and peers on the unit. Patient compliant with medication administration per MD orders. Patient given education, support, and encouragement to be active in her treatment plan. Patient being monitored Q 15 minutes for safety per unit protocol. Pt remains safe on the unit.  

## 2020-04-08 NOTE — [facility] Group Notes (Signed)
LCSW Group Therapy Note  04/08/2020 1:43 PM  Type of Therapy/Topic:  Group Therapy:  Feelings about Diagnosis  Participation Level:  Did Not Attend   Description of Group:   This group will allow patients to explore their thoughts and feelings about diagnoses they have received. Patients will be guided to explore their level of understanding and acceptance of these diagnoses. Facilitator will encourage patients to process their thoughts and feelings about the reactions of others to their diagnosis and will guide patients in identifying ways to discuss their diagnosis with significant others in their lives. This group will be process-oriented, with patients participating in exploration of their own experiences, giving and receiving support, and processing challenge from other group members.   Therapeutic Goals: 1. Patient will demonstrate understanding of diagnosis as evidenced by identifying two or more symptoms of the disorder 2. Patient will be able to express two feelings regarding the diagnosis 3. Patient will demonstrate their ability to communicate their needs through discussion and/or role play  Summary of Patient Progress: X  Therapeutic Modalities:   Cognitive Behavioral Therapy Brief Therapy Feelings Identification   Penni Homans, MSW, LCSW 04/08/2020 1:43 PM

## 2020-04-08 NOTE — BHH Counselor (Signed)
CSW called to check on status of [facility] referrals.  Recommended to call back tomorrow 04/09/2020 to check on possible bed availability for 04/10/2020.  Patient has been approved but is awaiting bed.  Carmel Garfield, MSW, LCSW 04/08/2020 9:09 AM  

## 2020-04-08 NOTE — B[facility] Counselor (Addendum)
CSW contacted the following placements in an attempt to procure placement for patient.   [facility] [address] [place], [place], [place] [address] Phone: [phone] - acute mental health and substance detox. 5 days placement for active detox. Female beds available. Pt does not meet admission criteria   [facility] Address: [address] [place], [place] [address] Phone: [phone] -active detox openings for 7 days. Pt does not meet admission criteria -transitional housing for women with 60 days sobriety. No current openings  Mauri Temkin [place], MSW, LCSW-A 3/22/20229:52 AM

## 2020-04-08 NOTE — Progress Notes (Addendum)
[facility], [place] MD Progress Note  04/08/2020 10:58 AM Rachel Nolan  MRN:  [id number]  Principal Problem: Severe recurrent major depression with psychotic features (HCC) Diagnosis: Principal Problem:   Severe recurrent major depression with psychotic features (HCC) Active Problems:   Post traumatic stress disorder (PTSD)   Cocaine use disorder (HCC)   Alcohol use disorder, severe, dependence (HCC)   HTN (hypertension)   Neuropathic pain  CC "Still can't see out of my eye"  Rachel Nolan is a 50y.o. female with major depressive disorder and PTSD presenting for worsening mood, suicidal ideations, and homicidal ideations.   Interval History Patient was seen today for re-evaluation.  Yesterday, patient complained of headache followed by monocular vision loss. CTA head and neck completed and negative for intracranial process. EKG normal sinus rhythm, labs largely reassuring, and vital signs stable. Neurology was consult completed and greatly appreciated. Dr. Tempie Hoist felt the head pain caused her to cover her eye and notice her cataract for first time, but recommended ophthalmology consult. Nursing reports no events overnight. The patient has no issues with performing ADLs.  Patient has been medication compliant.    Subjective:  Today, Dr. Brooke Dare from ophthalmology was contacted. He agreed with current work up thus far. He will see her either today vs. Tomorrow morning. On assessment today she reports some anxiety and depression over lack of discharge options. She has been accepted to [facility], but no beds anticipated to be open until 3/24. Have no heard back from [facility]. [facility] not accepting patients until mid April. She remains on waiting list for bottom bunk bed at [facility] All other homeless shelters remain full at this time. She denies suicidal ideations, homicidal ideations, visual hallucinations, and auditory hallucinations. She denies nightmares, she denies medication side effects.     Total Time spent with patient: 20 minutes  Past Psychiatric History: see H&P  Past Medical History:  Past Medical History:  Diagnosis Date  . Anxiety   . Asthma   . Depression   . Hypertension   . Scoliosis     Past Surgical History:  Procedure Laterality Date  . BACK SURGERY    . ECTOPIC PREGNANCY SURGERY     Family History:  Family History  Problem Relation Age of Onset  . Hypertension Mother   . Hypertension Father    Family Psychiatric  History: see H&P Social History:  Social History   Substance and Sexual Activity  Alcohol Use Yes   Comment: drinks every other day     Social History   Substance and Sexual Activity  Drug Use Yes  . Types: Marijuana, Cocaine, "Crack" cocaine   Comment: Currently using crack    Social History   Socioeconomic History  . Marital status: Widowed    Spouse name: Not on file  . Number of children: Not on file  . Years of education: Not on file  . Highest education level: Not on file  Occupational History  . Not on file  Tobacco Use  . Smoking status: Current Every Day Smoker    Packs/day: 0.50    Types: Cigarettes  . Smokeless tobacco: Never Used  Vaping Use  . Vaping Use: Never used  Substance and Sexual Activity  . Alcohol use: Yes    Comment: drinks every other day  . Drug use: Yes    Types: Marijuana, Cocaine, "Crack" cocaine    Comment: Currently using crack  . Sexual activity: Not Currently  Other Topics Concern  . Not on file  Social History Narrative   Pt is homeless, no fixed address; not followed by an outpatient psychiatrist   Social Determinants of Health   Financial Resource Strain: Not on file  Food Insecurity: Not on file  Transportation Needs: Not on file  Physical Activity: Not on file  Stress: Not on file  Social Connections: Not on file   Additional Social History:    Sleep: Fair  Appetite:  Good  Current Medications: Current Facility-Administered Medications  Medication Dose  Route Frequency Provider Last Rate Last Admin  . acetaminophen (TYLENOL) tablet 650 mg  650 mg Oral Q6H PRN Clapacs, Jackquline DenmarkJohn T, MD   650 mg at 04/07/20 1628  . albuterol (VENTOLIN HFA) 108 (90 Base) MCG/ACT inhaler 1-2 puff  1-2 puff Inhalation TID PRN Thalia PartyPaliy, Alisa, MD   2 puff at 04/06/20 2126  . alum & mag hydroxide-simeth (MAALOX/MYLANTA) 200-200-20 MG/5ML suspension 30 mL  30 mL Oral Q4H PRN Clapacs, John T, MD      . amLODipine (NORVASC) tablet 10 mg  10 mg Oral Daily Clapacs, Jackquline DenmarkJohn T, MD   10 mg at 04/08/20 0756  . FLUoxetine (PROZAC) capsule 60 mg  60 mg Oral Daily Jesse SansFreeman, Askari Kinley M, MD   60 mg at 04/08/20 0756  . gabapentin (NEURONTIN) capsule 600 mg  600 mg Oral TID Jesse SansFreeman, Pearlee Arvizu M, MD   600 mg at 04/08/20 0756  . ibuprofen (ADVIL) tablet 400 mg  400 mg Oral Q4H PRN Jesse SansFreeman, Shauntae Reitman M, MD   400 mg at 04/07/20 2132  . magnesium hydroxide (MILK OF MAGNESIA) suspension 30 mL  30 mL Oral Daily PRN Clapacs, John T, MD      . prazosin (MINIPRESS) capsule 3 mg  3 mg Oral QHS Jesse SansFreeman, Ilo Beamon M, MD   3 mg at 04/07/20 2133  . psyllium (HYDROCIL/METAMUCIL) 1 packet  1 packet Oral Daily Reggie PileJoshi, Anand, MD   1 packet at 04/07/20 [phone]  . QUEtiapine (SEROQUEL) tablet 400 mg  400 mg Oral QHS Jesse SansFreeman, Berdell Nevitt M, MD   400 mg at 04/07/20 2133  . traZODone (DESYREL) tablet 150 mg  150 mg Oral QHS Clapacs, Jackquline DenmarkJohn T, MD   150 mg at 04/07/20 2132    Lab Results:  Results for orders placed or performed during the hospital encounter of 03/25/20 (from the past 48 hour(s))  CBC with Differential/Platelet     Status: Abnormal   Collection Time: 04/07/20  3:20 PM  Result Value Ref Range   WBC 5.3 4.0 - 10.5 K/uL   RBC 3.63 (L) 3.87 - 5.11 MIL/uL   Hemoglobin 11.5 (L) 12.0 - 15.0 g/dL   HCT 96.034.4 (L) 45.436.0 - 09.846.0 %   MCV 94.8 80.0 - 100.0 fL   MCH 31.7 26.0 - 34.0 pg   MCHC 33.4 30.0 - 36.0 g/dL   RDW 11.914.8 14.711.5 - 82.915.5 %   Platelets 218 150 - 400 K/uL   nRBC 0.0 0.0 - 0.2 %   Neutrophils Relative % 51 %   Neutro Abs 2.7  1.7 - 7.7 K/uL   Lymphocytes Relative 35 %   Lymphs Abs 1.8 0.7 - 4.0 K/uL   Monocytes Relative 13 %   Monocytes Absolute 0.7 0.1 - 1.0 K/uL   Eosinophils Relative 1 %   Eosinophils Absolute 0.1 0.0 - 0.5 K/uL   Basophils Relative 0 %   Basophils Absolute 0.0 0.0 - 0.1 K/uL   Immature Granulocytes 0 %   Abs Immature Granulocytes 0.02 0.00 - 0.07 K/uL  Comment: Performed at [facility], [address] [place], [place] [address]  Protime-INR     Status: None   Collection Time: 04/07/20  3:20 PM  Result Value Ref Range   Prothrombin Time 12.4 11.4 - 15.2 seconds   INR 1.0 0.8 - 1.2    Comment: (NOTE) INR goal varies based on device and disease states. Performed at [facility], [address] [place], [place] [address]   APTT     Status: None   Collection Time: 04/07/20  3:20 PM  Result Value Ref Range   aPTT 31 24 - 36 seconds    Comment: Performed at [facility], [address] [place], [place] [address]  Factor 2 assay     Status: None   Collection Time: 04/07/20  3:20 PM  Result Value Ref Range   Factor II Activity 114 50 - 154 %    Comment: (NOTE) Performed At: [facility] [address] [place], [place] [id number] Jolene Schimke MD MW:[id number]   Comprehensive metabolic panel     Status: Abnormal   Collection Time: 04/07/20  3:20 PM  Result Value Ref Range   Sodium 136 135 - 145 mmol/L   Potassium 4.0 3.5 - 5.1 mmol/L    Comment: HEMOLYSIS AT THIS LEVEL MAY AFFECT RESULT   Chloride 102 98 - 111 mmol/L   CO2 28 22 - 32 mmol/L   Glucose, Bld 119 (H) 70 - 99 mg/dL    Comment: Glucose reference range applies only to samples taken after fasting for at least 8 hours.   BUN 15 6 - 20 mg/dL   Creatinine, Ser 4.03 0.44 - 1.00 mg/dL   Calcium 8.7 (L) 8.9 - 10.3 mg/dL   Total Protein 6.8 6.5 - 8.1 g/dL   Albumin 3.4 (L) 3.5 - 5.0 g/dL   AST 58 (H) 15 - 41 U/L   ALT 77 (H) 0 - 44 U/L   Alkaline Phosphatase 57 38 - 126 U/L    Total Bilirubin 0.5 0.3 - 1.2 mg/dL   GFR, Estimated >47 >42 mL/min    Comment: (NOTE) Calculated using the CKD-EPI Creatinine Equation (2021)    Anion gap 6 5 - 15    Comment: Performed at [facility], [address] [place], [place] [address]  D-dimer, quantitative     Status: Abnormal   Collection Time: 04/07/20  3:20 PM  Result Value Ref Range   D-Dimer, Quant 0.67 (H) 0.00 - 0.50 ug/mL-FEU    Comment: (NOTE) At the manufacturer cut-off value of 0.5 g/mL FEU, this assay has a negative predictive value of 95-100%.This assay is intended for use in conjunction with a clinical pretest probability (PTP) assessment model to exclude pulmonary embolism (PE) and deep venous thrombosis (DVT) in outpatients suspected of PE or DVT. Results should be correlated with clinical presentation. Performed at [facility], [address] [place], [place] [address]   Fibrinogen     Status: None   Collection Time: 04/07/20  3:20 PM  Result Value Ref Range   Fibrinogen 269 210 - 475 mg/dL    Comment: Performed at [facility], [address] [place], [place] [address]    Blood Alcohol level:  Lab Results  Component Value Date   [facility] <10 03/24/2020   ETH <10 01/22/2020    Metabolic Disorder Labs: Lab Results  Component Value Date   HGBA1C 5.5 01/24/2020   MPG 111.15 01/24/2020   MPG 105.41 12/07/2019  No results found for: PROLACTIN Lab Results  Component Value Date   CHOL 148 01/24/2020   TRIG 72 01/24/2020   HDL 67 01/24/2020   CHOLHDL 2.2 01/24/2020   VLDL 14 01/24/2020   LDLCALC 67 01/24/2020   LDLCALC 83 12/07/2019    Physical Findings: AIMS:  , ,  ,  ,    CIWA:    COWS:     Musculoskeletal: Strength & Muscle Tone: within normal limits Gait & Station: normal Patient leans: N/A   Physical Exam: Physical Exam ROS  Blood pressure (!) 113/93, pulse 100, temperature 98.3 F (36.8 C), temperature source Oral, resp. rate 18, height 5'  6" (1.676 m), weight 84.4 kg, last menstrual period 06/19/2018, SpO2 97 %. Body mass index is 30.02 kg/m.   Psychiatric Specialty Exam:  Presentation  General Appearance: Casual  Eye Contact: fair  Speech:Normal Rate  Speech Volume:Normal  Handedness:Right   Mood and Affect  Mood:Anxious  Affect:Congruent; Constricted   Thought Process  Thought Processes:Coherent  Descriptions of Associations:Intact  Orientation:Full (Time, Place and Person)  Thought Content:Perseveration  History of Schizophrenia/Schizoaffective disorder:No  Duration of Psychotic Symptoms:Less than six months  Hallucinations: denies today  Ideas of Reference:None  Suicidal Thoughts: denies today  Homicidal Thoughts:Homicidal Thoughts: No   Sensorium  Memory:Immediate Fair  Judgment:Fair  Insight:Fair   Executive Functions  Concentration:Fair  Attention Span:Fair  Recall:Fair  Fund of Knowledge:Fair  Language:Fair   Psychomotor Activity  Psychomotor Activity:Psychomotor Activity: Decreased   Assets  Assets:Communication Skills; Desire for Improvement   Sleep  Sleep:Sleep: Fair    Treatment Plan Summary: Daily contact with patient to assess and evaluate symptoms and progress in treatment and Medication management  Patient is a 50 year old female/female with the above-stated past psychiatric history who is seen in follow-up.  Chart reviewed. Patient discussed with nursing. Patient denies suicidal ideations, homicidal ideations, visual hallucinations, or auditory hallucinations. She denies nightmares.    Plan:  -continue inpatient psych admission; 15-minute checks; daily contact with patient to assess and evaluate symptoms and progress in treatment; psychoeducation.  -continue scheduled medications: 1)MDD, recurrent, severe with psychotic features- established problem, improved - Continue Seroquel 400 mg nightly for mood and  hallucinations, continue  fluoxetine 60 mg daily, trazodone 150 mg QHS for insomnia  2) PTSD, chronic- established problem, improving - Prozac as above, continue prazosin 3 mg QHS for nightmares  3) HTN- established problem. stable - Amlodipine 10 mg daily  4) Neuropathic pain  - Continue gabapentin to 600 mg TID  5) Monocular Vision loss- new problem, ongoing  - Neurology consult completed and greatly appreciated. CTA head and neck negative for acute intracranial process.  - Opthalmology consult placed this morning. Dr. Brooke Dare to see patient today vs. First thing tmw morning. Assistance also greatly appreciated   -continue PRN medications.  acetaminophen, albuterol, alum & mag hydroxide-simeth, ibuprofen, magnesium hydroxide  -Pertinent Labs: no new labs ordered today       -Disposition: All necessary aftercare will be arranged prior to discharge Likely d/c to shelter with outpatient psych follow-up vs inpatient substance abuse treatment  -  I certify that the patient does need, on a daily basis, active treatment furnished directly by or requiring the supervision of inpatient psychiatric facility personnel.   04/08/20 Psychiatric exam above reviewed and remains accurate. Assessment and plan above reviewed and updated.    Jesse Sans, MD 04/08/2020, 10:58 AM

## 2020-04-08 NOTE — Plan of Care (Signed)
Patient presents at her baseline, awaiting placement   Problem: Education: Goal: Emotional status will improve Outcome: Progressing Goal: Mental status will improve Outcome: Progressing

## 2020-04-08 NOTE — Progress Notes (Signed)
Patient denies any eye pain, headache pain or dizziness. She does complain of some hip pain and received Ibuprofen per prn order with relief. She is pleasant and cooperative and denies SI, HI and AVH

## 2020-04-08 NOTE — Plan of Care (Signed)
Patient pleasant and cooperative on approach. Patient stated that she has some anxiety about finding a safe place to live. Denies SI,HI and AVH. Compliant with medications. Appetite and energy level good. Support and encouragement given.

## 2020-04-08 NOTE — Progress Notes (Signed)
Recreation Therapy Notes    Date: 04/08/2020   Time: 9:30 am   Location: Court yard     Behavioral response: N/A   Intervention Topic: Leisure    Discussion/Intervention: Patient did not attend group.   Clinical Observations/Feedback:  Patient did not attend group.   Shaverence Outlaw LRT/CTRS            Shaverence  Outlaw 04/08/2020 11:53 AM

## 2020-04-08 NOTE — Plan of Care (Signed)
  Problem: Education: Goal: Knowledge of [facility] Education information/materials will improve Outcome: Progressing Goal: Emotional status will improve Outcome: Progressing Goal: Mental status will improve Outcome: Progressing Goal: Verbalization of understanding the information provided will improve Outcome: Progressing   Problem: Activity: Goal: Interest or engagement in activities will improve Outcome: Progressing Goal: Sleeping patterns will improve Outcome: Progressing   Problem: Coping: Goal: Ability to verbalize frustrations and anger appropriately will improve Outcome: Progressing Goal: Ability to demonstrate self-control will improve Outcome: Progressing   Problem: Health Behavior/Discharge Planning: Goal: Identification of resources available to assist in meeting health care needs will improve Outcome: Progressing Goal: Compliance with treatment plan for underlying cause of condition will improve Outcome: Progressing   Problem: Physical Regulation: Goal: Ability to maintain clinical measurements within normal limits will improve Outcome: Progressing   Problem: Safety: Goal: Periods of time without injury will increase Outcome: Progressing   Problem: Education: Goal: Utilization of techniques to improve thought processes will improve Outcome: Progressing Goal: Knowledge of the prescribed therapeutic regimen will improve Outcome: Progressing   Problem: Activity: Goal: Interest or engagement in leisure activities will improve Outcome: Progressing Goal: Imbalance in normal sleep/wake cycle will improve Outcome: Progressing   Problem: Coping: Goal: Coping ability will improve Outcome: Progressing Goal: Will verbalize feelings Outcome: Progressing   Problem: Health Behavior/Discharge Planning: Goal: Ability to make decisions will improve Outcome: Progressing Goal: Compliance with therapeutic regimen will improve Outcome: Progressing    Problem: Role Relationship: Goal: Will demonstrate positive changes in social behaviors and relationships Outcome: Progressing   Problem: Safety: Goal: Ability to disclose and discuss suicidal ideas will improve Outcome: Progressing Goal: Ability to identify and utilize support systems that promote safety will improve Outcome: Progressing   Problem: Self-Concept: Goal: Will verbalize positive feelings about self Outcome: Progressing Goal: Level of anxiety will decrease Outcome: Progressing   Problem: Education: Goal: Ability to state activities that reduce stress will improve Outcome: Progressing   Problem: Coping: Goal: Ability to identify and develop effective coping behavior will improve Outcome: Progressing   Problem: Self-Concept: Goal: Ability to identify factors that promote anxiety will improve Outcome: Progressing Goal: Level of anxiety will decrease Outcome: Progressing Goal: Ability to modify response to factors that promote anxiety will improve Outcome: Progressing   Problem: Education: Goal: Ability to make informed decisions regarding treatment will improve Outcome: Progressing   Problem: Coping: Goal: Coping ability will improve Outcome: Progressing   Problem: Health Behavior/Discharge Planning: Goal: Identification of resources available to assist in meeting health care needs will improve Outcome: Progressing   Problem: Medication: Goal: Compliance with prescribed medication regimen will improve Outcome: Progressing   Problem: Self-Concept: Goal: Ability to disclose and discuss suicidal ideas will improve Outcome: Progressing Goal: Will verbalize positive feelings about self Outcome: Progressing   

## 2020-04-09 NOTE — Consult Note (Signed)
Reason for Consult:decreased vision right eye Referring Physician: Dr. Neale Burly Chief complaint: "I can't see at all out of my right eye"  HPI: Rachel Nolan is an 50 y.o. homeless, AA female inpatient for behavior health and substance abuse related issues who reports decreased vision in the right eye.  She reports that she was braiding someones hair on Monday inpatient when she experienced sharp pain/right sided headache.  She said she inspected herself in the mirror and noticed that her right eye was not normal, and then realized that she had poor vision out of the right eye. A neurology consult and stroke workup were performed.  No aggravating, alleviating or associated symptoms.  She denies eye pain, flashes, floaters, double vision.  Past Medical History:  Diagnosis Date  . Anxiety   . Asthma   . Depression   . Hypertension   . Scoliosis     ROS  Past Surgical History:  Procedure Laterality Date  . BACK SURGERY    . ECTOPIC PREGNANCY SURGERY      Family History  Problem Relation Age of Onset  . Hypertension Mother   . Hypertension Father     Social History:  reports that she has been smoking cigarettes. She has been smoking about 0.50 packs per day. She has never used smokeless tobacco. She reports current alcohol use. She reports current drug use. Drugs: Marijuana, Cocaine, and "Crack" cocaine.  Allergies: Not on File  Prior to Admission medications   Medication Sig Start Date End Date Taking? Authorizing Provider  amLODipine (NORVASC) 10 MG tablet Take 1 tablet (10 mg total) by mouth daily. 02/28/20   Zena Amos, MD  FLUoxetine (PROZAC) 40 MG capsule Take 1 capsule (40 mg total) by mouth daily. 02/28/20   Zena Amos, MD  gabapentin (NEURONTIN) 300 MG capsule Take 1 capsule (300 mg total) by mouth 3 (three) times daily. 02/28/20   Zena Amos, MD  prazosin (MINIPRESS) 1 MG capsule Take 3 capsules (3 mg total) by mouth at bedtime. 02/28/20   Zena Amos, MD   QUEtiapine (SEROQUEL) 300 MG tablet Take 1 tablet (300 mg total) by mouth at bedtime. 02/28/20   Zena Amos, MD  traZODone (DESYREL) 150 MG tablet Take 1 tablet (150 mg total) by mouth at bedtime as needed for sleep. 02/28/20   Zena Amos, MD  citalopram (CELEXA) 20 MG tablet Take 1 tablet (20 mg total) by mouth daily. 03/07/19 07/23/19  Aldean Baker, NP  lisinopril (ZESTRIL) 10 MG tablet Take 1 tablet (10 mg total) by mouth daily. 03/07/19 09/11/19  Aldean Baker, NP  pantoprazole (PROTONIX) 40 MG tablet Take 1 tablet (40 mg total) by mouth daily. 03/07/19 07/23/19  Aldean Baker, NP    Results for orders placed or performed during the hospital encounter of 03/25/20 (from the past 48 hour(s))  CBC with Differential/Platelet     Status: Abnormal   Collection Time: 04/07/20  3:20 PM  Result Value Ref Range   WBC 5.3 4.0 - 10.5 K/uL   RBC 3.63 (L) 3.87 - 5.11 MIL/uL   Hemoglobin 11.5 (L) 12.0 - 15.0 g/dL   HCT 81.1 (L) 91.4 - 78.2 %   MCV 94.8 80.0 - 100.0 fL   MCH 31.7 26.0 - 34.0 pg   MCHC 33.4 30.0 - 36.0 g/dL   RDW 95.6 21.3 - 08.6 %   Platelets 218 150 - 400 K/uL   nRBC 0.0 0.0 - 0.2 %   Neutrophils Relative % 51 %  Neutro Abs 2.7 1.7 - 7.7 K/uL   Lymphocytes Relative 35 %   Lymphs Abs 1.8 0.7 - 4.0 K/uL   Monocytes Relative 13 %   Monocytes Absolute 0.7 0.1 - 1.0 K/uL   Eosinophils Relative 1 %   Eosinophils Absolute 0.1 0.0 - 0.5 K/uL   Basophils Relative 0 %   Basophils Absolute 0.0 0.0 - 0.1 K/uL   Immature Granulocytes 0 %   Abs Immature Granulocytes 0.02 0.00 - 0.07 K/uL    Comment: Performed at [facility], [address] [place], [place] [address]  Protime-INR     Status: None   Collection Time: 04/07/20  3:20 PM  Result Value Ref Range   Prothrombin Time 12.4 11.4 - 15.2 seconds   INR 1.0 0.8 - 1.2    Comment: (NOTE) INR goal varies based on device and disease states. Performed at [facility], [address] [place], [place] [address]    APTT     Status: None   Collection Time: 04/07/20  3:20 PM  Result Value Ref Range   aPTT 31 24 - 36 seconds    Comment: Performed at [facility], [address] [place], [place] [address]  Factor 2 assay     Status: None   Collection Time: 04/07/20  3:20 PM  Result Value Ref Range   Factor II Activity 114 50 - 154 %    Comment: (NOTE) Performed At: [facility] [address] [place], [place] [id number] Jolene SchimkeNagendra Sanjai MD OZ:[id number]:[phone]   Comprehensive metabolic panel     Status: Abnormal   Collection Time: 04/07/20  3:20 PM  Result Value Ref Range   Sodium 136 135 - 145 mmol/L   Potassium 4.0 3.5 - 5.1 mmol/L    Comment: HEMOLYSIS AT THIS LEVEL MAY AFFECT RESULT   Chloride 102 98 - 111 mmol/L   CO2 28 22 - 32 mmol/L   Glucose, Bld 119 (H) 70 - 99 mg/dL    Comment: Glucose reference range applies only to samples taken after fasting for at least 8 hours.   BUN 15 6 - 20 mg/dL   Creatinine, Ser 6.290.78 0.44 - 1.00 mg/dL   Calcium 8.7 (L) 8.9 - 10.3 mg/dL   Total Protein 6.8 6.5 - 8.1 g/dL   Albumin 3.4 (L) 3.5 - 5.0 g/dL   AST 58 (H) 15 - 41 U/L   ALT 77 (H) 0 - 44 U/L   Alkaline Phosphatase 57 38 - 126 U/L   Total Bilirubin 0.5 0.3 - 1.2 mg/dL   GFR, Estimated >52>60 >84>60 mL/min    Comment: (NOTE) Calculated using the CKD-EPI Creatinine Equation (2021)    Anion gap 6 5 - 15    Comment: Performed at [facility], [address] [place], [place] [address]  D-dimer, quantitative     Status: Abnormal   Collection Time: 04/07/20  3:20 PM  Result Value Ref Range   D-Dimer, Quant 0.67 (H) 0.00 - 0.50 ug/mL-FEU    Comment: (NOTE) At the manufacturer cut-off value of 0.5 g/mL FEU, this assay has a negative predictive value of 95-100%.This assay is intended for use in conjunction with a clinical pretest probability (PTP) assessment model to exclude pulmonary embolism (PE) and deep venous thrombosis (DVT) in outpatients suspected of PE or DVT. Results  should be correlated with clinical presentation. Performed at [facility], [address] [place], [place] [address]   Fibrinogen     Status: None  Collection Time: 04/07/20  3:20 PM  Result Value Ref Range   Fibrinogen 269 210 - 475 mg/dL    Comment: Performed at [facility], [address] [place], [place] [address]    CT ANGIO HEAD CODE STROKE  Result Date: 04/07/2020 CLINICAL DATA:  Decreased vision right eye.  Rule out stroke. EXAM: CT ANGIOGRAPHY HEAD AND NECK TECHNIQUE: Multidetector CT imaging of the head and neck was performed using the standard protocol during bolus administration of intravenous contrast. Multiplanar CT image reconstructions and MIPs were obtained to evaluate the vascular anatomy. Carotid stenosis measurements (when applicable) are obtained utilizing NASCET criteria, using the distal internal carotid diameter as the denominator. CONTRAST:  78mL OMNIPAQUE IOHEXOL 350 MG/ML SOLN COMPARISON:  CT headache 15 2017 FINDINGS: CT HEAD FINDINGS Brain: No evidence of acute infarction, hemorrhage, hydrocephalus, extra-axial collection or mass lesion/mass effect. Vascular: Negative for hyperdense vessel Skull: Negative Sinuses: Paranasal sinuses clear. Orbits: Negative Review of the MIP images confirms the above findings CTA NECK FINDINGS Aortic arch: Normal aortic arch. Bovine branching arch. Proximal great vessels normal. Right carotid system: Normal right carotid. Negative for atherosclerotic disease or stenosis Left carotid system: Normal left carotid. Negative for atherosclerotic disease or stenosis. Vertebral arteries: Both vertebral arteries are normal and widely patent to the basilar. Skeleton: Cervical spondylosis.  No acute skeletal abnormality. Other neck: Mild thyroid enlargement diffusely. No focal nodule. No mass or adenopathy in the neck. Upper chest: Extensive apical emphysema.  No acute abnormality. Review of the MIP images confirms the above  findings CTA HEAD FINDINGS Anterior circulation: Cavernous carotid widely patent bilaterally. Anterior and middle cerebral arteries widely patent and normal bilaterally. Posterior circulation: Both vertebral arteries widely patent to the basilar. Posterior circulation widely patent without stenosis or large vessel occlusion. Venous sinuses: Normal venous enhancement. Hypoplastic right transverse and sigmoid sinus. Anatomic variants: None Review of the MIP images confirms the above findings IMPRESSION: 1. Negative CT head. 2. Negative CT angio head and neck. No intracranial extracranial stenosis. 3. These results were called by telephone at the time of interpretation on 04/07/2020 at 4:10 pm to provider [facility] , who verbally acknowledged these results. Electronically Signed   By: Marlan Palau M.D.   On: 04/07/2020 16:10   CT ANGIO NECK CODE STROKE  Result Date: 04/07/2020 CLINICAL DATA:  Decreased vision right eye.  Rule out stroke. EXAM: CT ANGIOGRAPHY HEAD AND NECK TECHNIQUE: Multidetector CT imaging of the head and neck was performed using the standard protocol during bolus administration of intravenous contrast. Multiplanar CT image reconstructions and MIPs were obtained to evaluate the vascular anatomy. Carotid stenosis measurements (when applicable) are obtained utilizing NASCET criteria, using the distal internal carotid diameter as the denominator. CONTRAST:  73mL OMNIPAQUE IOHEXOL 350 MG/ML SOLN COMPARISON:  CT headache 15 2017 FINDINGS: CT HEAD FINDINGS Brain: No evidence of acute infarction, hemorrhage, hydrocephalus, extra-axial collection or mass lesion/mass effect. Vascular: Negative for hyperdense vessel Skull: Negative Sinuses: Paranasal sinuses clear. Orbits: Negative Review of the MIP images confirms the above findings CTA NECK FINDINGS Aortic arch: Normal aortic arch. Bovine branching arch. Proximal great vessels normal. Right carotid system: Normal right carotid. Negative for  atherosclerotic disease or stenosis Left carotid system: Normal left carotid. Negative for atherosclerotic disease or stenosis. Vertebral arteries: Both vertebral arteries are normal and widely patent to the basilar. Skeleton: Cervical spondylosis.  No acute skeletal abnormality. Other neck: Mild thyroid enlargement diffusely. No focal nodule. No mass or adenopathy in the neck. Upper chest:  Extensive apical emphysema.  No acute abnormality. Review of the MIP images confirms the above findings CTA HEAD FINDINGS Anterior circulation: Cavernous carotid widely patent bilaterally. Anterior and middle cerebral arteries widely patent and normal bilaterally. Posterior circulation: Both vertebral arteries widely patent to the basilar. Posterior circulation widely patent without stenosis or large vessel occlusion. Venous sinuses: Normal venous enhancement. Hypoplastic right transverse and sigmoid sinus. Anatomic variants: None Review of the MIP images confirms the above findings IMPRESSION: 1. Negative CT head. 2. Negative CT angio head and neck. No intracranial extracranial stenosis. 3. These results were called by telephone at the time of interpretation on 04/07/2020 at 4:10 pm to provider [facility] , who verbally acknowledged these results. Electronically Signed   By: Marlan Palau M.D.   On: 04/07/2020 16:10    Blood pressure (!) 129/93, pulse (!) 101, temperature (!) 97.5 F (36.4 C), temperature source Oral, resp. rate 18, height 5\' 6"  (1.676 m), weight 84.4 kg, last menstrual period 06/19/2018, SpO2 98 %.  Mental status: Alert and Oriented, pleasant, cooperative.  Ambulates with walker.  Visual Acuity:  Light perception OD  20/70 eyechart [place]  Pupils:  Equally round/ reactive to light.  No Afferent defect.  Motility:  Full/ orthophoric  Visual Fields:  Unable to count fingers OD, OS appears constricted nasally  IOP:  Soft by palpation  External/ Lids/ Lashes:  Normal  Anterior  Segment:  Conjunctiva:  Normal  OU  Cornea:  Normal  OU  Anterior Chamber: Normal  OU  Lens:   Dense, mature cataract right eye, 3+ NS with white cataract;  In the left eye, there is a 1+NS and 1+ peripheral cortical cataract.  Posterior Segment: Dilated OU with 1% Tropicamide and 2.5% Phenylephrine  Posterior view in the right eye is extremely limited due to dense cataract right eye.   Discs:   0.2 left eye: normal c/d ratio, no pallor, no edema left eye.  Right posterior segment not visualized.  Macula:  Normal left eye.  Vessels/ Periphery: Normal left eye.    Assessment/Plan: 1.  Dense mature cataract in the right eye.  The patient likely noticed longstanding decreased vision in the right eye upon closer inspection and covering the left eye prompted by her headache. Normal, reassuring left eye with mild cataract.  It is possible that there is additional pathology in the posterior segment of the right eye, but that can not be evaluated until after cataract surgery. The patient may benefit from spectacle correction.  Recommend: nonurgent outpatient followup with ophthalmology for cataract evaluation.   Signing off, please contact me with additional questions or concerns.  08/19/2018 04/09/2020, 7:50 AM

## 2020-04-09 NOTE — Progress Notes (Signed)
Patient appears depressed and hopeless on assessment. Patient expresses concern over needing cataract surgery and being homeless. She continues to complain of left hip pain. She denies SI, HI, and AVH.   Patient remains compliant with medications. Support and encouragement is provided. Patient remains safe on the unit at this time and q15 min safety checks are maintained.

## 2020-04-09 NOTE — Plan of Care (Signed)
Pt presents at her baseline, awaiting discharge. Pt denies SI/HI/AVH  Problem: Education: Goal: Emotional status will improve Outcome: Not Progressing Goal: Mental status will improve Outcome: Not Progressing

## 2020-04-09 NOTE — [facility] Group Notes (Signed)
LCSW Group Therapy Note  04/09/2020 2:08 PM  Type of Therapy/Topic:  Group Therapy:  Emotion Regulation  Participation Level:  Did Not Attend   Description of Group:   The purpose of this group is to assist patients in learning to regulate negative emotions and experience positive emotions. Patients will be guided to discuss ways in which they have been vulnerable to their negative emotions. These vulnerabilities will be juxtaposed with experiences of positive emotions or situations, and patients will be challenged to use positive emotions to combat negative ones. Special emphasis will be placed on coping with negative emotions in conflict situations, and patients will process healthy conflict resolution skills.  Therapeutic Goals: 1. Patient will identify two positive emotions or experiences to reflect on in order to balance out negative emotions 2. Patient will label two or more emotions that they find the most difficult to experience 3. Patient will demonstrate positive conflict resolution skills through discussion and/or role plays  Summary of Patient Progress: X  Therapeutic Modalities:   Cognitive Behavioral Therapy Feelings Identification Dialectical Behavioral Therapy  Aloura Matsuoka R. Algis Greenhouse, MSW, LCSW, LCAS 04/09/2020 2:08 PM

## 2020-04-09 NOTE — Progress Notes (Signed)
Patient calm and compliant during assessment denying SI/HI/AVH. Patient observed interacting appropriately with staff and peers on the unit. Patient compliant with medication administration per MD orders. Patient given education, support, and encouragement to be active in her treatment plan. Patient being monitored Q 15 minutes for safety per unit protocol. Pt remains safe on the unit.  

## 2020-04-09 NOTE — Plan of Care (Signed)
  Problem: Group Participation Goal: STG - Patient will engage in groups without prompting or encouragement from LRT x3 group sessions within 5 recreation therapy group sessions Description: STG - Patient will engage in groups without prompting or encouragement from LRT x3 group sessions within 5 recreation therapy group sessions Outcome: Not Progressing   

## 2020-04-09 NOTE — Progress Notes (Addendum)
Recreation Therapy Notes  Date: 04/09/2020  Time: 10:00 am   Location: Craft room     Behavioral response: N/A   Intervention Topic: Happiness    Discussion/Intervention: Patient did not attend group.   Clinical Observations/Feedback:  Patient did not attend group.   Innocence Schlotzhauer LRT/CTRS        Teondra Newburg 04/09/2020 12:33 PM

## 2020-04-09 NOTE — Progress Notes (Addendum)
[facility] MD Progress Note  04/09/2020 12:08 PM Rachel Nolan  MRN:  [id number]  Principal Problem: Severe recurrent major depression with psychotic features [facility]) Diagnosis: Principal Problem:   Severe recurrent major depression with psychotic features (HCC) Active Problems:   Post traumatic stress disorder (PTSD)   Cocaine use disorder (HCC)   Alcohol use disorder, severe, dependence (HCC)   HTN (hypertension)   Neuropathic pain  CC "Upset about this cataract"  [name] is a 50y.o. female with major depressive disorder and PTSD presenting for worsening mood, suicidal ideations, and homicidal ideations.   Interval History Patient was seen today for re-evaluation. Nursing reports no events overnight. The patient has no issues with performing ADLs.  Patient has been medication compliant.    Subjective:  Today, Dr. Brooke Dare from ophthalmology was able to evaluate patient. He recommend outpatient follow-up for cataracts. Patient seen one-on-one this morning. She notes that she is saddened about cataracts requiring surgery, but continues to be relieved there was no stroke or aneurysm. She is also feeling disheartened about inability to find substance abuse treatment or shelter to stay at. Continues to be on waiting list for bottom bunk at [facility] shelter. Social work team also reaching out to [facility] again today to see if bed has become available. Still awaiting to hear back from [facility]. She was declined by [facility]. [facility] no availability until mid April. She denies suicidal ideations, homicidal ideations, visual hallucinations, and auditory hallucinations. She denies nightmares, she denies medication side effects.    Total Time spent with patient: 20 minutes  Past Psychiatric History: see H&P  Past Medical History:  Past Medical History:  Diagnosis Date  . Anxiety   . Asthma   . Depression   . Hypertension   . Scoliosis     Past Surgical History:  Procedure Laterality  Date  . BACK SURGERY    . ECTOPIC PREGNANCY SURGERY     Family History:  Family History  Problem Relation Age of Onset  . Hypertension Mother   . Hypertension Father    Family Psychiatric  History: see H&P Social History:  Social History   Substance and Sexual Activity  Alcohol Use Yes   Comment: drinks every other day     Social History   Substance and Sexual Activity  Drug Use Yes  . Types: Marijuana, Cocaine, "Crack" cocaine   Comment: Currently using crack    Social History   Socioeconomic History  . Marital status: Widowed    Spouse name: Not on file  . Number of children: Not on file  . Years of education: Not on file  . Highest education level: Not on file  Occupational History  . Not on file  Tobacco Use  . Smoking status: Current Every Day Smoker    Packs/day: 0.50    Types: Cigarettes  . Smokeless tobacco: Never Used  Vaping Use  . Vaping Use: Never used  Substance and Sexual Activity  . Alcohol use: Yes    Comment: drinks every other day  . Drug use: Yes    Types: Marijuana, Cocaine, "Crack" cocaine    Comment: Currently using crack  . Sexual activity: Not Currently  Other Topics Concern  . Not on file  Social History Narrative   Pt is homeless, no fixed address; not followed by an outpatient psychiatrist   Social Determinants of Health   Financial Resource Strain: Not on file  Food Insecurity: Not on file  Transportation Needs: Not on file  Physical  Activity: Not on file  Stress: Not on file  Social Connections: Not on file   Additional Social History:    Sleep: Fair  Appetite:  Good  Current Medications: Current Facility-Administered Medications  Medication Dose Route Frequency Provider Last Rate Last Admin  . acetaminophen (TYLENOL) tablet 650 mg  650 mg Oral Q6H PRN Clapacs, Jackquline Denmark, MD   650 mg at 04/09/20 0818  . albuterol (VENTOLIN HFA) 108 (90 Base) MCG/ACT inhaler 1-2 puff  1-2 puff Inhalation TID PRN Thalia Party, MD   2  puff at 04/08/20 2151  . alum & mag hydroxide-simeth (MAALOX/MYLANTA) 200-200-20 MG/5ML suspension 30 mL  30 mL Oral Q4H PRN Clapacs, John T, MD      . amLODipine (NORVASC) tablet 10 mg  10 mg Oral Daily Clapacs, Jackquline Denmark, MD   10 mg at 04/09/20 0650  . FLUoxetine (PROZAC) capsule 60 mg  60 mg Oral Daily Jesse Sans, MD   60 mg at 04/09/20 0818  . gabapentin (NEURONTIN) capsule 600 mg  600 mg Oral TID Jesse Sans, MD   600 mg at 04/09/20 1149  . ibuprofen (ADVIL) tablet 400 mg  400 mg Oral Q4H PRN Jesse Sans, MD   400 mg at 04/07/20 2132  . magnesium hydroxide (MILK OF MAGNESIA) suspension 30 mL  30 mL Oral Daily PRN Clapacs, John T, MD      . prazosin (MINIPRESS) capsule 3 mg  3 mg Oral QHS Jesse Sans, MD   3 mg at 04/08/20 2150  . psyllium (HYDROCIL/METAMUCIL) 1 packet  1 packet Oral Daily Reggie Pile, MD   1 packet at 04/09/20 0820  . QUEtiapine (SEROQUEL) tablet 400 mg  400 mg Oral QHS Jesse Sans, MD   400 mg at 04/08/20 2150  . traZODone (DESYREL) tablet 150 mg  150 mg Oral QHS Clapacs, Jackquline Denmark, MD   150 mg at 04/08/20 2150    Lab Results:  Results for orders placed or performed during the hospital encounter of 03/25/20 (from the past 48 hour(s))  CBC with Differential/Platelet     Status: Abnormal   Collection Time: 04/07/20  3:20 PM  Result Value Ref Range   WBC 5.3 4.0 - 10.5 K/uL   RBC 3.63 (L) 3.87 - 5.11 MIL/uL   Hemoglobin 11.5 (L) 12.0 - 15.0 g/dL   HCT 78.2 (L) 95.6 - 21.3 %   MCV 94.8 80.0 - 100.0 fL   MCH 31.7 26.0 - 34.0 pg   MCHC 33.4 30.0 - 36.0 g/dL   RDW 08.6 57.8 - 46.9 %   Platelets 218 150 - 400 K/uL   nRBC 0.0 0.0 - 0.2 %   Neutrophils Relative % 51 %   Neutro Abs 2.7 1.7 - 7.7 K/uL   Lymphocytes Relative 35 %   Lymphs Abs 1.8 0.7 - 4.0 K/uL   Monocytes Relative 13 %   Monocytes Absolute 0.7 0.1 - 1.0 K/uL   Eosinophils Relative 1 %   Eosinophils Absolute 0.1 0.0 - 0.5 K/uL   Basophils Relative 0 %   Basophils Absolute 0.0 0.0 -  0.1 K/uL   Immature Granulocytes 0 %   Abs Immature Granulocytes 0.02 0.00 - 0.07 K/uL    Comment: Performed at [facility], [address] [place], [place] [address]  Protime-INR     Status: None   Collection Time: 04/07/20  3:20 PM  Result Value Ref Range   Prothrombin Time 12.4 11.4 - 15.2 seconds  INR 1.0 0.8 - 1.2    Comment: (NOTE) INR goal varies based on device and disease states. Performed at [facility], [address] [place], [place] [address]   APTT     Status: None   Collection Time: 04/07/20  3:20 PM  Result Value Ref Range   aPTT 31 24 - 36 seconds    Comment: Performed at [facility], [address] [place], [place] [address]  Factor 2 assay     Status: None   Collection Time: 04/07/20  3:20 PM  Result Value Ref Range   Factor II Activity 114 50 - 154 %    Comment: (NOTE) Performed At: [facility] [address] [place], [place] [id number] Jolene Schimke MD EX:[id number]   Comprehensive metabolic panel     Status: Abnormal   Collection Time: 04/07/20  3:20 PM  Result Value Ref Range   Sodium 136 135 - 145 mmol/L   Potassium 4.0 3.5 - 5.1 mmol/L    Comment: HEMOLYSIS AT THIS LEVEL MAY AFFECT RESULT   Chloride 102 98 - 111 mmol/L   CO2 28 22 - 32 mmol/L   Glucose, Bld 119 (H) 70 - 99 mg/dL    Comment: Glucose reference range applies only to samples taken after fasting for at least 8 hours.   BUN 15 6 - 20 mg/dL   Creatinine, Ser 4.96 0.44 - 1.00 mg/dL   Calcium 8.7 (L) 8.9 - 10.3 mg/dL   Total Protein 6.8 6.5 - 8.1 g/dL   Albumin 3.4 (L) 3.5 - 5.0 g/dL   AST 58 (H) 15 - 41 U/L   ALT 77 (H) 0 - 44 U/L   Alkaline Phosphatase 57 38 - 126 U/L   Total Bilirubin 0.5 0.3 - 1.2 mg/dL   GFR, Estimated >75 >91 mL/min    Comment: (NOTE) Calculated using the CKD-EPI Creatinine Equation (2021)    Anion gap 6 5 - 15    Comment: Performed at [facility], [address] [place], [place] [address]  D-dimer,  quantitative     Status: Abnormal   Collection Time: 04/07/20  3:20 PM  Result Value Ref Range   D-Dimer, Quant 0.67 (H) 0.00 - 0.50 ug/mL-FEU    Comment: (NOTE) At the manufacturer cut-off value of 0.5 g/mL FEU, this assay has a negative predictive value of 95-100%.This assay is intended for use in conjunction with a clinical pretest probability (PTP) assessment model to exclude pulmonary embolism (PE) and deep venous thrombosis (DVT) in outpatients suspected of PE or DVT. Results should be correlated with clinical presentation. Performed at [facility], [address] [place], [place] [address]   Fibrinogen     Status: None   Collection Time: 04/07/20  3:20 PM  Result Value Ref Range   Fibrinogen 269 210 - 475 mg/dL    Comment: Performed at [facility], [address] [place], [place] [address]    Blood Alcohol level:  Lab Results  Component Value Date   [facility] <10 03/24/2020   ETH <10 01/22/2020    Metabolic Disorder Labs: Lab Results  Component Value Date   HGBA1C 5.5 01/24/2020   MPG 111.15 01/24/2020   MPG 105.41 12/07/2019   No results found for: PROLACTIN Lab Results  Component Value Date   CHOL 148 01/24/2020   TRIG 72 01/24/2020   HDL 67 01/24/2020   CHOLHDL 2.2 01/24/2020   VLDL 14 01/24/2020   LDLCALC 67 01/24/2020   LDLCALC 83  12/07/2019    Physical Findings: AIMS:  , ,  ,  ,    CIWA:    COWS:     Musculoskeletal: Strength & Muscle Tone: within normal limits Gait & Station: normal Patient leans: N/A   Physical Exam: Physical Exam  ROS  Blood pressure (!) 129/93, pulse (!) 101, temperature (!) 97.5 F (36.4 C), temperature source Oral, resp. rate 18, height 5\' 6"  (1.676 m), weight 84.4 kg, last menstrual period 06/19/2018, SpO2 98 %. Body mass index is 30.02 kg/m.   Psychiatric Specialty Exam:  Presentation  General Appearance: Casual  Eye Contact: fair  Speech:Normal Rate  Speech  Volume:Normal  Handedness:Right   Mood and Affect  Mood:Anxious  Affect:Congruent; Constricted   Thought Process  Thought Processes:Coherent  Descriptions of Associations:Intact  Orientation:Full (Time, Place and Person)  Thought Content:Perseveration  History of Schizophrenia/Schizoaffective disorder:No  Duration of Psychotic Symptoms:Less than six months  Hallucinations: denies today  Ideas of Reference:None  Suicidal Thoughts: denies today  Homicidal Thoughts:Homicidal Thoughts: No   Sensorium  Memory:Immediate Fair  Judgment:Fair  Insight:Fair   Executive Functions  Concentration:Fair  Attention Span:Fair  Recall:Fair  Fund of Knowledge:Fair  Language:Fair   Psychomotor Activity  Psychomotor Activity:Psychomotor Activity: Decreased   Assets  Assets:Communication Skills; Desire for Improvement   Sleep  Sleep:Sleep: Fair    Treatment Plan Summary: Daily contact with patient to assess and evaluate symptoms and progress in treatment and Medication management  Patient is a 50 year old female/female with the above-stated past psychiatric history who is seen in follow-up.  Chart reviewed. Patient discussed with nursing. Patient denies suicidal ideations, homicidal ideations, visual hallucinations, or auditory hallucinations. She denies nightmares.    Plan:  -continue inpatient psych admission; 15-minute checks; daily contact with patient to assess and evaluate symptoms and progress in treatment; psychoeducation.  -continue scheduled medications: 1)MDD, recurrent, severe with psychotic features- established problem, improved - Continue Seroquel 400 mg nightly for mood and hallucinations, continue  fluoxetine 60 mg daily, trazodone 150 mg QHS for insomnia  2) PTSD, chronic- established problem, improved - Prozac as above, continue prazosin 3 mg QHS for nightmares  3) HTN- established problem. stable -  Amlodipine 10 mg daily  4) Neuropathic pain  - Continue gabapentin to 600 mg TID  5) Monocular Vision loss- new problem, ongoing  - Neurology consult completed and greatly appreciated. CTA head and neck negative for acute intracranial process.  - Opthalmology consult completed, and appreciated. Felt to be secondary from cataracts, recommend outpatient follow-up.   -continue PRN medications.  acetaminophen, albuterol, alum & mag hydroxide-simeth, ibuprofen, magnesium hydroxide  -Pertinent Labs: no new labs ordered today     -Disposition: All necessary aftercare will be arranged prior to discharge Likely d/c to shelter with outpatient psych follow-up vs inpatient substance abuse treatment  -  I certify that the patient does need, on a daily basis, active treatment furnished directly by or requiring the supervision of inpatient psychiatric facility personnel.   04/09/20: Psychiatric exam above reviewed and remains accurate. Assessment and plan above reviewed and updated.   Jesse SansMegan M Iley Breeden, MD 04/09/2020, 12:08 PM

## 2020-04-09 NOTE — BHH Counselor (Signed)
CSW received voicemail from T[place] at [facility].  She reports that the patient was actually denied and she apologizes for the confusion.  She reports that per the psychiatrist the patient is more "psych than SA".  It was recommended on the voicemail that CSW refer the patient to [facility].  CSW notes that this is different than reports on Tuesday afternoon and Wednesday 04/09/2020 morning call where [name] reported that patient remained accepted but no beds are available.  CSW had been asked to call back at 3pm this afternoon for a bed.  Penni Homans, MSW, LCSW 04/09/2020 11:35 AM

## 2020-04-10 ENCOUNTER — Other Ambulatory Visit (HOSPITAL_COMMUNITY): Payer: Self-pay | Admitting: Behavioral Health

## 2020-04-10 MED ORDER — PRAZOSIN HCL 1 MG PO CAPS: 3.0000 mg | ORAL_CAPSULE | Freq: Every day | ORAL | 0 refills | Status: DC

## 2020-04-10 MED ORDER — QUETIAPINE FUMARATE 400 MG PO TABS: 400.0000 mg | ORAL_TABLET | Freq: Every day | ORAL | 0 refills | Status: DC

## 2020-04-10 MED ORDER — TRAZODONE HCL 150 MG PO TABS: 150.0000 mg | ORAL_TABLET | Freq: Every day | ORAL | 0 refills | Status: DC

## 2020-04-10 MED ORDER — FLUOXETINE HCL 20 MG PO CAPS: 60.0000 mg | ORAL_CAPSULE | Freq: Every day | ORAL | 0 refills | Status: DC

## 2020-04-10 MED ORDER — GABAPENTIN 300 MG PO CAPS: 600.0000 mg | ORAL_CAPSULE | Freq: Three times a day (TID) | ORAL | 0 refills | Status: DC

## 2020-04-10 MED ORDER — AMLODIPINE BESYLATE 10 MG PO TABS: 10.0000 mg | ORAL_TABLET | Freq: Every day | ORAL | 0 refills | Status: DC

## 2020-04-10 NOTE — BHH Group Notes (Signed)
LCSW Group Therapy Note     04/10/2020 1:58 PM     Type of Therapy/Topic:  Group Therapy:  Balance in Life     Participation Level:  Did Not Attend     Description of Group:    This group will address the concept of balance and how it feels and looks when one is unbalanced. Patients will be encouraged to process areas in their lives that are out of balance and identify reasons for remaining unbalanced. Facilitators will guide patients in utilizing problem-solving interventions to address and correct the stressor making their life unbalanced. Understanding and applying boundaries will be explored and addressed for obtaining and maintaining a balanced life. Patients will be encouraged to explore ways to assertively make their unbalanced needs known to significant others in their lives, using other group members and facilitator for support and feedback.     Therapeutic Goals:  1.      Patient will identify two or more emotions or situations they have that consume much of in their lives.  2.      Patient will identify signs/triggers that life has become out of balance:  3.      Patient will identify two ways to set boundaries in order to achieve balance in their lives:  4.      Patient will demonstrate ability to communicate their needs through discussion and/or role plays     Summary of Patient Progress: X    Therapeutic Modalities:   Cognitive Behavioral Therapy  Solution-Focused Therapy  Assertiveness Training     Stillman Buenger [place] MSW, LCSW-A  04/10/2020 1:58 PM

## 2020-04-10 NOTE — BHH Counselor (Signed)
CSW checked in with [facility], they had previously scheduled a phone call to discuss a possible bed for patient.  Call never occurred.  Today 04/10/2020, they report that the women dorm is now full.  CSW again followed up with [facility] and left HIPAA compliant voicemail.  Penni Homans, MSW, LCSW 04/10/2020 9:14 AM

## 2020-04-10 NOTE — BHH Counselor (Signed)
CSW attempted to contact [facility] again, the call went to VM again.  CSW called [facility] back and was informed that they are at capacity.  Reported that there is not a waiting list and intake is daily.  Patient/staff would need to call daily for a bed.  [facility] reports that currently filled.  Penni Homans, MSW, LCSW 04/10/2020 10:41 AM

## 2020-04-10 NOTE — Progress Notes (Signed)
Recreation Therapy Notes  Date: 04/10/2020  Time: 9:30 am   Location: Craft room   Behavioral response: N/A   Intervention Topic: Animal Assisted therapy    Discussion/Intervention: Patient did not attend group.   Clinical Observations/Feedback:  Patient did not attend group.   Qais Jowers LRT/CTRS          Andreina Outten 04/10/2020 1:19 PM

## 2020-04-10 NOTE — Progress Notes (Signed)
[facility] MD Progress Note  04/10/2020 12:06 PM Rachel Nolan  MRN:  [id number]  Principal Problem: Severe recurrent major depression with psychotic features [facility]) Diagnosis: Principal Problem:   Severe recurrent major depression with psychotic features (HCC) Active Problems:   Post traumatic stress disorder (PTSD)   Cocaine use disorder (HCC)   Alcohol use disorder, severe, dependence (HCC)   HTN (hypertension)   Neuropathic pain  CC "I'm doing alright"  Rachel Nolan is a 50y.o. female with major depressive disorder and PTSD presenting for worsening mood, suicidal ideations, and homicidal ideations.   Interval History Patient was seen today for re-evaluation. Nursing reports no events overnight. The patient has no issues with performing ADLs.  Patient has been medication compliant.    Subjective:  Today, patient reports she is feeling alright. She denies any nightmares, flashbacks, suicidal ideations, homicidal ideations, visual hallucinations, and auditory hallucinations. She continues to feel anxious about her ability to maintain sobriety at discharge. She has a history of multiple relapses on cocaine and alcohol. Team has tried to assist with placement in long-term substance abuse treatment programs. She has been denied by [facility]. [facility] has not returned voicemails left by social work team, and [facility] is not accepting patients until mid April. At this time team is also trying to secure placement in a homeless shelter. [facility] currently [facility], and [facility] was unable to accommodate her need for bottom bunk bed earlier this week. Team has reached out again to [facility] and [place] to seek an open bed. She does not have family or friends she can live with at this time.     Total Time spent with patient: 20 minutes  Past Psychiatric History: see H&P  Past Medical History:  Past Medical History:  Diagnosis Date  . Anxiety   . Asthma   . Depression   .  Hypertension   . Scoliosis     Past Surgical History:  Procedure Laterality Date  . BACK SURGERY    . ECTOPIC PREGNANCY SURGERY     Family History:  Family History  Problem Relation Age of Onset  . Hypertension Mother   . Hypertension Father    Family Psychiatric  History: see H&P Social History:  Social History   Substance and Sexual Activity  Alcohol Use Yes   Comment: drinks every other day     Social History   Substance and Sexual Activity  Drug Use Yes  . Types: Marijuana, Cocaine, "Crack" cocaine   Comment: Currently using crack    Social History   Socioeconomic History  . Marital status: Widowed    Spouse name: Not on file  . Number of children: Not on file  . Years of education: Not on file  . Highest education level: Not on file  Occupational History  . Not on file  Tobacco Use  . Smoking status: Current Every Day Smoker    Packs/day: 0.50    Types: Cigarettes  . Smokeless tobacco: Never Used  Vaping Use  . Vaping Use: Never used  Substance and Sexual Activity  . Alcohol use: Yes    Comment: drinks every other day  . Drug use: Yes    Types: Marijuana, Cocaine, "Crack" cocaine    Comment: Currently using crack  . Sexual activity: Not Currently  Other Topics Concern  . Not on file  Social History Narrative   Pt is homeless, no fixed address; not followed by an outpatient psychiatrist   Social Determinants of Health  Financial Resource Strain: Not on file  Food Insecurity: Not on file  Transportation Needs: Not on file  Physical Activity: Not on file  Stress: Not on file  Social Connections: Not on file   Additional Social History:    Sleep: Fair  Appetite:  Good  Current Medications: Current Facility-Administered Medications  Medication Dose Route Frequency Provider Last Rate Last Admin  . acetaminophen (TYLENOL) tablet 650 mg  650 mg Oral Q6H PRN Clapacs, Jackquline Denmark, MD   650 mg at 04/09/20 2128  . albuterol (VENTOLIN HFA) 108 (90  Base) MCG/ACT inhaler 1-2 puff  1-2 puff Inhalation TID PRN Thalia Party, MD   2 puff at 04/08/20 2151  . alum & mag hydroxide-simeth (MAALOX/MYLANTA) 200-200-20 MG/5ML suspension 30 mL  30 mL Oral Q4H PRN Clapacs, John T, MD      . amLODipine (NORVASC) tablet 10 mg  10 mg Oral Daily Clapacs, Jackquline Denmark, MD   10 mg at 04/10/20 0815  . FLUoxetine (PROZAC) capsule 60 mg  60 mg Oral Daily Jesse Sans, MD   60 mg at 04/10/20 0815  . gabapentin (NEURONTIN) capsule 600 mg  600 mg Oral TID Jesse Sans, MD   600 mg at 04/10/20 1136  . ibuprofen (ADVIL) tablet 400 mg  400 mg Oral Q4H PRN Jesse Sans, MD   400 mg at 04/10/20 3710  . magnesium hydroxide (MILK OF MAGNESIA) suspension 30 mL  30 mL Oral Daily PRN Clapacs, John T, MD      . prazosin (MINIPRESS) capsule 3 mg  3 mg Oral QHS Jesse Sans, MD   3 mg at 04/09/20 2128  . psyllium (HYDROCIL/METAMUCIL) 1 packet  1 packet Oral Daily Reggie Pile, MD   1 packet at 04/09/20 0820  . QUEtiapine (SEROQUEL) tablet 400 mg  400 mg Oral QHS Jesse Sans, MD   400 mg at 04/09/20 2128  . traZODone (DESYREL) tablet 150 mg  150 mg Oral QHS Clapacs, Jackquline Denmark, MD   150 mg at 04/09/20 2128    Lab Results:  No results found for this or any previous visit (from the past 48 hour(s)).  Blood Alcohol level:  Lab Results  Component Value Date   ETH <10 03/24/2020   ETH <10 01/22/2020    Metabolic Disorder Labs: Lab Results  Component Value Date   HGBA1C 5.5 01/24/2020   MPG 111.15 01/24/2020   MPG 105.41 12/07/2019   No results found for: PROLACTIN Lab Results  Component Value Date   CHOL 148 01/24/2020   TRIG 72 01/24/2020   HDL 67 01/24/2020   CHOLHDL 2.2 01/24/2020   VLDL 14 01/24/2020   LDLCALC 67 01/24/2020   LDLCALC 83 12/07/2019    Physical Findings: AIMS:  , ,  ,  ,    CIWA:    COWS:     Musculoskeletal: Strength & Muscle Tone: within normal limits Gait & Station: normal Patient leans: N/A   Physical Exam: Physical  Exam  ROS  Blood pressure 131/89, pulse 93, temperature 98.2 F (36.8 C), temperature source Oral, resp. rate 18, height 5\' 6"  (1.676 m), weight 84.4 kg, last menstrual period 06/19/2018, SpO2 96 %. Body mass index is 30.02 kg/m.   Psychiatric Specialty Exam:  Presentation  General Appearance: Casual  Eye Contact: fair  Speech:Normal Rate  Speech Volume:Normal  Handedness:Right   Mood and Affect  Mood:Anxious  Affect:Congruent; Constricted   Thought Process  Thought Processes:Coherent  Descriptions of Associations:Intact  Orientation:Full (  Time, Place and Person)  Thought Content:Logical  History of Schizophrenia/Schizoaffective disorder:No  Duration of Psychotic Symptoms:Less than six months  Hallucinations: denies today  Ideas of Reference:None  Suicidal Thoughts: denies today  Homicidal Thoughts:Homicidal Thoughts: No   Sensorium  Memory:Immediate Fair  Judgment:Fair  Insight:Fair   Executive Functions  Concentration:Fair  Attention Span:Fair  Recall:Fair  Fund of Knowledge:Fair  Language:Fair   Psychomotor Activity  Psychomotor Activity:Psychomotor Activity: Decreased   Assets  Assets:Communication Skills; Desire for Improvement   Sleep  Sleep:Sleep: Fair    Treatment Plan Summary: Daily contact with patient to assess and evaluate symptoms and progress in treatment and Medication management  Patient is a 50 year old female/female with the above-stated past psychiatric history who is seen in follow-up.  Chart reviewed. Patient discussed with nursing. Patient denies suicidal ideations, homicidal ideations, visual hallucinations, or auditory hallucinations. She denies nightmares.    Plan:  -continue inpatient psych admission; 15-minute checks; daily contact with patient to assess and evaluate symptoms and progress in treatment; psychoeducation.  -continue scheduled medications: 1)MDD,  recurrent, severe with psychotic features- established problem, improved - Continue Seroquel 400 mg nightly for mood and hallucinations, continue  fluoxetine 60 mg daily, trazodone 150 mg QHS for insomnia  2) PTSD, chronic- established problem, improved - Prozac as above, continue prazosin 3 mg QHS for nightmares  3) HTN- established problem. stable - Amlodipine 10 mg daily  4) Neuropathic pain  - Continue gabapentin to 600 mg TID  5) Monocular Vision loss- new problem, stable - Neurology consult completed and greatly appreciated. CTA head and neck negative for acute intracranial process.  - Opthalmology consult completed, and appreciated. Felt to be secondary from cataracts, recommend outpatient follow-up.   -continue PRN medications.  acetaminophen, albuterol, alum & mag hydroxide-simeth, ibuprofen, magnesium hydroxide    -Disposition: All necessary aftercare will be arranged prior to discharge Likely d/c to shelter with outpatient psych follow-up vs inpatient substance abuse treatment  -  I certify that the patient does need, on a daily basis, active treatment furnished directly by or requiring the supervision of inpatient psychiatric facility personnel.   04/10/20: Psychiatric exam above reviewed and remains accurate. Assessment and plan above reviewed and updated.    Jesse Sans, MD 04/10/2020, 12:06 PM

## 2020-04-10 NOTE — Progress Notes (Signed)
Physical Therapy Treatment Patient Details Name: Rachel Nolan MRN: [id number] DOB: Apr 18, 1970 Today's Date: 04/10/2020    History of Present Illness Pt is a 50 year old female with major depressive disorder and PTSD presenting for worsening mood, suicidal ideations, and homicidal ideations.  PMH includes HTN, neuropathic pain, scoliosis, and back surgery.    PT Comments    Pt put forth good effort with below therex but declined OOB activity secondary to L hip pain.  Extensive time spent educating pt on importance of mobility for joint health and general physiological benefits of activity.  Current goals remain appropriate for patient at this time.  Pt will benefit from HHPT upon discharge to safely address deficits listed in patient problem list for decreased caregiver assistance and eventual return to PLOF.    Follow Up Recommendations  Home health PT;Supervision - Intermittent     Equipment Recommendations  None recommended by PT    Recommendations for Other Services       Precautions / Restrictions Precautions Precautions: None Restrictions Weight Bearing Restrictions: No    Mobility  Bed Mobility               General bed mobility comments: Pt declined OOB activity this session    Transfers                    Ambulation/Gait                 Stairs             Wheelchair Mobility    Modified Rankin (Stroke Patients Only)       Balance                                            Cognition Arousal/Alertness: Awake/alert Behavior During Therapy: WFL for tasks assessed/performed Overall Cognitive Status: Within Functional Limits for tasks assessed                                        Exercises Total Joint Exercises Ankle Circles/Pumps: AROM;Strengthening;Both;5 reps;10 reps Quad Sets: Strengthening;10 reps;Both;5 reps Gluteal Sets: Strengthening;Both;10 reps;5 reps Towel Squeeze:  Strengthening;Both;10 reps Heel Slides: Strengthening;Right;10 reps Hip ABduction/ADduction: Strengthening;Right;10 reps Straight Leg Raises: Strengthening;Right;10 reps Bridges: Strengthening;Right;10 reps;5 reps Other Exercises Other Exercises: Pt education/review provided on physiological benefits of activity and general principles of activity progression Other Exercises: HEP education/review for BLE APs, QS, GS, and LAQs x 10 each every 1-2 hours daily to tolerance; BLE SLR, hip abd/add, and bridges x 10 twice daily as tolerated    General Comments        Pertinent Vitals/Pain Pain Assessment: 0-10 Pain Score: 6  Pain Location: L hip Pain Descriptors / Indicators: Aching;Sore Pain Intervention(s): Premedicated before session;Monitored during session    Home Living                      Prior Function            PT Goals (current goals can now be found in the care plan section) Progress towards PT goals: Not progressing toward goals - comment (limited by L hip pain)    Frequency    Min 2X/week      PT Plan Current plan remains appropriate    Co-evaluation  AM-PAC PT "6 Clicks" Mobility   Outcome Measure  Help needed turning from your back to your side while in a flat bed without using bedrails?: None Help needed moving from lying on your back to sitting on the side of a flat bed without using bedrails?: None Help needed moving to and from a bed to a chair (including a wheelchair)?: None Help needed standing up from a chair using your arms (e.g., wheelchair or bedside chair)?: None Help needed to walk in hospital room?: A Little Help needed climbing 3-5 steps with a railing? : A Little 6 Click Score: 22    End of Session   Activity Tolerance: Patient tolerated treatment well Patient left: in bed Nurse Communication: Mobility status PT Visit Diagnosis: Difficulty in walking, not elsewhere classified (R26.2);Muscle weakness  (generalized) (M62.81);Pain Pain - Right/Left: Left Pain - part of body: Hip;Leg     Time: 9563-8756 PT Time Calculation (min) (ACUTE ONLY): 23 min  Charges:  $Therapeutic Exercise: 8-22 mins $Therapeutic Activity: 8-22 mins                     D. Scott Larraine Argo PT, DPT 04/10/20, 4:09 PM

## 2020-04-10 NOTE — Progress Notes (Signed)
Pt is a calm, cooperative and med compliant. Pt is withdrawn, assertive and at times social. Pt did not attend groups. Pt is safe and is still using a walker. Pt anticipates discharge. Torrie Mayers RN

## 2020-04-10 NOTE — Progress Notes (Signed)
Patient alert and oriented x 4, affect is flat thoughts are organized and coherent no distress noted , she ambulates with walker on the unit, she currently denies SI/HI/AVH, she was complaint with medication regimen, 15 minutes safety checks maintained will continue to monitor.

## 2020-04-10 NOTE — Progress Notes (Signed)
Pt denies depression, anxiety, SI, HI and AVH. Pt was educated on care plan and verbalizes understanding. Pt encouraged to attend groups. Torrie Mayers RN

## 2020-04-11 MED ORDER — FLUOXETINE HCL 20 MG PO CAPS
60.0000 mg | ORAL_CAPSULE | Freq: Every day | ORAL | 1 refills | Status: DC
Start: 2020-04-11 — End: 2020-04-22

## 2020-04-11 MED ORDER — QUETIAPINE FUMARATE 400 MG PO TABS: 400.0000 mg | ORAL_TABLET | Freq: Every day | ORAL | 1 refills | Status: DC

## 2020-04-11 MED ORDER — PRAZOSIN HCL 1 MG PO CAPS: 3.0000 mg | ORAL_CAPSULE | Freq: Every day | ORAL | 1 refills | Status: DC

## 2020-04-11 MED ORDER — AMLODIPINE BESYLATE 10 MG PO TABS: 10.0000 mg | ORAL_TABLET | Freq: Every day | ORAL | 1 refills | Status: DC

## 2020-04-11 MED ORDER — GABAPENTIN 300 MG PO CAPS: 600.0000 mg | ORAL_CAPSULE | Freq: Three times a day (TID) | ORAL | 1 refills | Status: DC

## 2020-04-11 MED ORDER — TRAZODONE HCL 150 MG PO TABS: 150.0000 mg | ORAL_TABLET | Freq: Every day | ORAL | 1 refills | Status: DC

## 2020-04-11 NOTE — Discharge Summary (Signed)
Physician Discharge Summary Note  Patient:  Rachel Nolan is an 50 y.o., female MRN:  [id number] DOB:  [birth year] Patient phone:  [phone] (home)  Patient address:   [address] [place] [address],  Total Time spent with patient: 35 minutes- 25 minutes face-to-face contact with patient, 10 minutes documentation, coordination of care, scripts   Date of Admission:  03/25/2020 Date of Discharge: 04/11/2020  Reason for Admission:  50 year old female with major depressive disorder and PTSD presenting for worsening mood, suicidal ideations, and homicidal ideations.  Principal Problem: Severe recurrent major depression with psychotic features [facility]) Discharge Diagnoses: Principal Problem:   Severe recurrent major depression with psychotic features (HCC) Active Problems:   Post traumatic stress disorder (PTSD)   Cocaine use disorder (HCC)   Alcohol use disorder, severe, dependence (HCC)   HTN (hypertension)   Neuropathic pain   Past Psychiatric History: Four previous hospitalizations. History of MDD, PTSD, cocaine use disorder, and benzodiazepine use disorder. Denies ever participating in outpatient treatment or therapy for mental health or substance abuse. Two prior suicide attempts via overdose. Prior history of hospitilization for benzodiazepine withdrawals.  Past Medical History:  Past Medical History:  Diagnosis Date  . Anxiety   . Asthma   . Depression   . Hypertension   . Scoliosis     Past Surgical History:  Procedure Laterality Date  . BACK SURGERY    . ECTOPIC PREGNANCY SURGERY     Family History:  Family History  Problem Relation Age of Onset  . Hypertension Mother   . Hypertension Father    Family Psychiatric  History: Father and sister with substance abuse Social History:  Social History   Substance and Sexual Activity  Alcohol Use Yes   Comment: drinks every other day     Social History   Substance and Sexual Activity  Drug Use Yes  . Types:  Marijuana, Cocaine, "Crack" cocaine   Comment: Currently using crack    Social History   Socioeconomic History  . Marital status: Widowed    Spouse name: Not on file  . Number of children: Not on file  . Years of education: Not on file  . Highest education level: Not on file  Occupational History  . Not on file  Tobacco Use  . Smoking status: Current Every Day Smoker    Packs/day: 0.50    Types: Cigarettes  . Smokeless tobacco: Never Used  Vaping Use  . Vaping Use: Never used  Substance and Sexual Activity  . Alcohol use: Yes    Comment: drinks every other day  . Drug use: Yes    Types: Marijuana, Cocaine, "Crack" cocaine    Comment: Currently using crack  . Sexual activity: Not Currently  Other Topics Concern  . Not on file  Social History Narrative   Pt is homeless, no fixed address; not followed by an outpatient psychiatrist   Social Determinants of Health   Financial Resource Strain: Not on file  Food Insecurity: Not on file  Transportation Needs: Not on file  Physical Activity: Not on file  Stress: Not on file  Social Connections: Not on file    Hospital Course:  50 year old female with major depressive disorder and PTSD presented for worsening mood, suicidal ideations, and homicidal ideations after being off medications for roughly two weeks. While in the hospital she was restarted on her medications, and discharged on Seroquel 400 mg QHS for mood and hallucinations, Prozac 60 mg daily for mood, prazosin 3 mg QHS for  nightmares, and trazodone 150 mg for sleep. She was continued on amlodipine 10 mg daily for HTN, and gabapentin 600 mg TID for neuropathic pain. During her stay she also suffered a headache, and noticed decreased vision in her right eye. Stroke work up completed and CTA head and neck negative for acute cranial process. Neurology and opthalmology were both consulted, and both felt this headache led her to cover her eye and notice her cataracts for first  time. Neurology recommends non-urgent outpatient follow-up for cataract evaluation. She also saw PT during her hospital stay who recommended home health PT. However, patient will be staying at a homeless shelter, and this is not possible to set up at this time. She will be provided with list of outpatient physical therapists and ophthalmologists in the area.    Physical Findings: AIMS: Facial and Oral Movements Muscles of Facial Expression: None, normal Lips and Perioral Area: None, normal Jaw: None, normal Tongue: None, normal,Extremity Movements Upper (arms, wrists, hands, fingers): None, normal Lower (legs, knees, ankles, toes): None, normal, Trunk Movements Neck, shoulders, hips: None, normal, Overall Severity Severity of abnormal movements (highest score from questions above): None, normal Incapacitation due to abnormal movements: None, normal Patient's awareness of abnormal movements (rate only patient's report): No Awareness, Dental Status Current problems with teeth and/or dentures?: No Does patient usually wear dentures?: No  CIWA:    COWS:     Musculoskeletal: Strength & Muscle Tone: decreased Gait & Station: ambulates with walker Patient leans: N/A   Psychiatric Specialty Exam: General Appearance: Casual  Eye Contact::  Good  Speech:  Clear and Coherent and Normal Rate  Volume:  Normal  Mood:  Euthymic  Affect:  Congruent  Thought Process:  Coherent and Linear  Orientation:  Full (Time, Place, and Person)  Thought Content:  Logical  Suicidal Thoughts:  No  Homicidal Thoughts:  No  Memory:  Immediate;   Fair Recent;   Fair Remote;   Fair  Judgement:  Intact  Insight:  Fair  Psychomotor Activity:  Normal  Concentration:  Fair  Recall:  [employer] of Knowledge:Fair  Language: Fair  Akathisia:  Negative  Handed:  Right  AIMS (if indicated):     Assets:  Communication Skills Desire for Improvement Resilience Social Support  Sleep:  Number of Hours: 6.5   Cognition: WNL  ADL's:  Intact     Physical Exam: Physical Exam Vitals and nursing note reviewed.  Constitutional:      Appearance: Normal appearance.  HENT:     Head: Normocephalic and atraumatic.     Right Ear: External ear normal.     Left Ear: External ear normal.     Nose: Nose normal.     Mouth/Throat:     Mouth: Mucous membranes are dry.  Eyes:     Extraocular Movements: Extraocular movements intact.     Pupils: Pupils are equal, round, and reactive to light.  Cardiovascular:     Rate and Rhythm: Normal rate.     Pulses: Normal pulses.  Pulmonary:     Effort: Pulmonary effort is normal.     Breath sounds: Normal breath sounds.  Abdominal:     General: Abdomen is flat.     Palpations: Abdomen is soft.  Musculoskeletal:        General: No swelling or tenderness.     Cervical back: Normal range of motion and neck supple.  Skin:    General: Skin is warm and dry.  Neurological:  General: No focal deficit present.     Mental Status: She is alert and oriented to person, place, and time.  Psychiatric:        Mood and Affect: Mood normal.        Behavior: Behavior normal.        Thought Content: Thought content normal.        Judgment: Judgment normal.   Review of Systems  Constitutional: Negative for activity change and fatigue.  HENT: Negative for rhinorrhea and sore throat.   Eyes: Negative for discharge and itching.  Respiratory: Negative for cough and shortness of breath.   Cardiovascular: Negative for chest pain and palpitations.  Gastrointestinal: Negative for constipation, diarrhea, nausea and vomiting.  Endocrine: Negative for cold intolerance and heat intolerance.  Genitourinary: Negative for difficulty urinating and dysuria.  Musculoskeletal: Positive for arthralgias and myalgias.  Skin: Negative for rash and wound.  Allergic/Immunologic: Negative for environmental allergies and food allergies.  Neurological: Negative for dizziness and headaches.   Hematological: Negative for adenopathy. Does not bruise/bleed easily.  Psychiatric/Behavioral: Negative for dysphoric mood, hallucinations, self-injury and suicidal ideas. The patient is not nervous/anxious.    Blood pressure 140/80, pulse 100, temperature 98.5 F (36.9 C), temperature source Oral, resp. rate 18, height 5\' 6"  (1.676 m), weight 84.4 kg, last menstrual period 06/19/2018, SpO2 98 %. Body mass index is 30.02 kg/m.   Have you used any form of tobacco in the last 30 days? (Cigarettes, Smokeless Tobacco, Cigars, and/or Pipes): Yes  Has this patient used any form of tobacco in the last 30 days? (Cigarettes, Smokeless Tobacco, Cigars, and/or Pipes)  Yes, A prescription for an FDA-approved tobacco cessation medication was offered at discharge and the patient refused  Blood Alcohol level:  Lab Results  Component Value Date   [facility] <10 03/24/2020   ETH <10 01/22/2020    Metabolic Disorder Labs:  Lab Results  Component Value Date   HGBA1C 5.5 01/24/2020   MPG 111.15 01/24/2020   MPG 105.41 12/07/2019   No results found for: PROLACTIN Lab Results  Component Value Date   CHOL 148 01/24/2020   TRIG 72 01/24/2020   HDL 67 01/24/2020   CHOLHDL 2.2 01/24/2020   VLDL 14 01/24/2020   LDLCALC 67 01/24/2020   LDLCALC 83 12/07/2019    See Psychiatric Specialty Exam and Suicide Risk Assessment completed by Attending Physician prior to discharge.  Discharge destination:  Other:  homeless shelter  Is patient on multiple antipsychotic therapies at discharge:  No   Has Patient had three or more failed trials of antipsychotic monotherapy by history:  No  Recommended Plan for Multiple Antipsychotic Therapies: NA  Discharge Instructions    Diet - low sodium heart healthy   Complete by: As directed    Increase activity slowly   Complete by: As directed      Allergies as of 04/11/2020   Not on File     Medication List    TAKE these medications     Indication  amLODipine 10  MG tablet Commonly known as: NORVASC Take 1 tablet (10 mg total) by mouth daily.  Indication: High Blood Pressure Disorder   FLUoxetine 20 MG capsule Commonly known as: PROZAC Take 3 capsules (60 mg total) by mouth daily. What changed:   medication strength  how much to take  Indication: Depression   gabapentin 300 MG capsule Commonly known as: NEURONTIN Take 2 capsules (600 mg total) by mouth 3 (three) times daily. What changed: how much to  take  Indication: Neuropathic Pain   prazosin 1 MG capsule Commonly known as: MINIPRESS Take 3 capsules (3 mg total) by mouth at bedtime.  Indication: Frightening Dreams   QUEtiapine 400 MG tablet Commonly known as: SEROQUEL Take 1 tablet (400 mg total) by mouth at bedtime. What changed:   medication strength  how much to take  Indication: Major Depressive Disorder   traZODone 150 MG tablet Commonly known as: DESYREL Take 1 tablet (150 mg total) by mouth at bedtime. What changed:   when to take this  reasons to take this  Indication: Trouble Sleeping       Follow-up Information    [facility] Follow up.   Specialty: Addiction Medicine Why: Not currenlty accepting applications until mid-April due to move.  Please call and follow up.  Thanks! Contact information: [address] [place] [address] [phone]        [facility] Follow up.   Why: Referral remains under review at this time. Thanks! Contact information: [address] [place] [address] [phone]               Follow-up recommendations:  Activity:  as tolerated Diet:  low sodium heart healthy diet  Comments:  7-day supply of free medications provided to patient at discharge along with printed 30-day scripts with 1 refill. She was also given a list of outpatient physical therapists and opthalmologists in the area near the shelter.   Signed: Jesse SansMegan M Obrien Huskins,  MD 04/11/2020, 10:04 AM

## 2020-04-11 NOTE — Progress Notes (Signed)
  [facility] Adult Case Management Discharge Plan :   Will you be returning to the same living situation after discharge:  No.  Patient being discharged to [facility] at her choice.  At discharge, do you have transportation home?: Yes,  pt reports a friend will provide transportation. Do you have the ability to pay for your medications: No.  Release of information consent forms completed and in the chart;  Patient's signature needed at discharge.  Patient to Follow up at:  Follow-up Information    [facility] Follow up.   Specialty: Addiction Medicine Why: Not currenlty accepting applications until mid-April due to move.  Please call and follow up.  Thanks! Contact information: [address] [place] [address] [phone]        [facility] Follow up.   Specialty: Behavioral Health Why: Walk in hours are at 8:30AM Monday through Friday.  Thanks! Contact information: [address] [place] [address] [phone]       [facility] Follow up.   Why: Unfortunately, they will not allow [place] to schedule an appointment for follow up because you have not been to [facility] yet.  Please call for your mental health, behavioral health and eye care needs.  Thanks! Contact information: [address] [address] [place], [place] [address] [phone]              Next level of care provider has access to [facility] Link:no  Safety Planning and Suicide Prevention discussed: Yes,  SPE completed with the patient.   Have you used any form of tobacco in the last 30 days? (Cigarettes, Smokeless Tobacco, Cigars, and/or Pipes): Yes  Has patient been referred to the Quitline?: Patient refused referral  Patient has been referred for addiction treatment: Yes  Harden Mo, LCSW 04/11/2020, 10:23 AM

## 2020-04-11 NOTE — BHH Counselor (Signed)
CSW spoke with the [place] at [facility].  She reported that the patient would need to complete the interview a second time as too much time had passed since previous interview.   It was stressed that the patient would have to climb a top bunk.  CSW expressed some concern for pt's ability to climb, however, pt reports that she would like to try it.   CSW did check with [facility] and [facility] to see on bed availability.  [facility] does not have beds and HIPAA compliant was left with [facility].  Penni Homans, MSW, LCSW 04/11/2020 9:57 AM

## 2020-04-11 NOTE — Progress Notes (Signed)
Pt denies SI, HI and AVH. Pt was educated on dc plan and verbalizes understanding. Pt received belongings, medications, prescriptions and dc packet including AVS.  Torrie Mayers RN

## 2020-04-11 NOTE — Plan of Care (Signed)
Pt ready for discharge.  Problem: Education: Goal: Knowledge of [facility] Education information/materials will improve 04/11/2020 1016 by Chalmers Cater, RN Outcome: Adequate for Discharge 04/11/2020 9675 by Chalmers Cater, RN Outcome: Progressing Goal: Emotional status will improve 04/11/2020 1016 by Chalmers Cater, RN Outcome: Adequate for Discharge 04/11/2020 9163 by Chalmers Cater, RN Outcome: Progressing Goal: Mental status will improve 04/11/2020 1016 by Chalmers Cater, RN Outcome: Adequate for Discharge 04/11/2020 8466 by Chalmers Cater, RN Outcome: Progressing Goal: Verbalization of understanding the information provided will improve 04/11/2020 1016 by Chalmers Cater, RN Outcome: Adequate for Discharge 04/11/2020 0950 by Chalmers Cater, RN Outcome: Progressing   Problem: Activity: Goal: Interest or engagement in activities will improve 04/11/2020 1016 by Chalmers Cater, RN Outcome: Adequate for Discharge 04/11/2020 5993 by Chalmers Cater, RN Outcome: Progressing Goal: Sleeping patterns will improve 04/11/2020 1016 by Chalmers Cater, RN Outcome: Adequate for Discharge 04/11/2020 5701 by Chalmers Cater, RN Outcome: Progressing   Problem: Coping: Goal: Ability to verbalize frustrations and anger appropriately will improve 04/11/2020 1016 by Chalmers Cater, RN Outcome: Adequate for Discharge 04/11/2020 7793 by Chalmers Cater, RN Outcome: Progressing Goal: Ability to demonstrate self-control will improve 04/11/2020 1016 by Chalmers Cater, RN Outcome: Adequate for Discharge 04/11/2020 0950 by Chalmers Cater, RN Outcome: Progressing   Problem: Health Behavior/Discharge Planning: Goal: Identification of resources available to assist in meeting health care needs will improve 04/11/2020 1016 by Chalmers Cater, RN Outcome: Adequate for Discharge 04/11/2020 9030 by Chalmers Cater, RN Outcome: Progressing Goal: Compliance with treatment plan for underlying  cause of condition will improve 04/11/2020 1016 by Chalmers Cater, RN Outcome: Adequate for Discharge 04/11/2020 0923 by Chalmers Cater, RN Outcome: Progressing   Problem: Physical Regulation: Goal: Ability to maintain clinical measurements within normal limits will improve 04/11/2020 1016 by Chalmers Cater, RN Outcome: Adequate for Discharge 04/11/2020 3007 by Chalmers Cater, RN Outcome: Progressing   Problem: Safety: Goal: Periods of time without injury will increase 04/11/2020 1016 by Chalmers Cater, RN Outcome: Adequate for Discharge 04/11/2020 0950 by Chalmers Cater, RN Outcome: Progressing   Problem: Education: Goal: Utilization of techniques to improve thought processes will improve 04/11/2020 1016 by Chalmers Cater, RN Outcome: Adequate for Discharge 04/11/2020 6226 by Chalmers Cater, RN Outcome: Progressing Goal: Knowledge of the prescribed therapeutic regimen will improve 04/11/2020 1016 by Chalmers Cater, RN Outcome: Adequate for Discharge 04/11/2020 0950 by Chalmers Cater, RN Outcome: Progressing   Problem: Activity: Goal: Interest or engagement in leisure activities will improve 04/11/2020 1016 by Chalmers Cater, RN Outcome: Adequate for Discharge 04/11/2020 3335 by Chalmers Cater, RN Outcome: Progressing Goal: Imbalance in normal sleep/wake cycle will improve 04/11/2020 1016 by Chalmers Cater, RN Outcome: Adequate for Discharge 04/11/2020 4562 by Chalmers Cater, RN Outcome: Progressing   Problem: Coping: Goal: Coping ability will improve 04/11/2020 1016 by Chalmers Cater, RN Outcome: Adequate for Discharge 04/11/2020 0950 by Chalmers Cater, RN Outcome: Progressing Goal: Will verbalize feelings 04/11/2020 1016 by Chalmers Cater, RN Outcome: Adequate for Discharge 04/11/2020 0950 by Chalmers Cater, RN Outcome: Progressing   Problem: Health Behavior/Discharge Planning: Goal: Ability to make decisions will improve 04/11/2020 1016 by Chalmers Cater,  RN Outcome: Adequate for Discharge 04/11/2020 5638 by Chalmers Cater, RN Outcome: Progressing Goal: Compliance with therapeutic regimen will improve 04/11/2020 1016 by Chalmers Cater, RN  Outcome: Adequate for Discharge 04/11/2020 8657 by Chalmers Cater, RN Outcome: Progressing   Problem: Role Relationship: Goal: Will demonstrate positive changes in social behaviors and relationships 04/11/2020 1016 by Chalmers Cater, RN Outcome: Adequate for Discharge 04/11/2020 0950 by Chalmers Cater, RN Outcome: Progressing   Problem: Safety: Goal: Ability to disclose and discuss suicidal ideas will improve 04/11/2020 1016 by Chalmers Cater, RN Outcome: Adequate for Discharge 04/11/2020 0950 by Chalmers Cater, RN Outcome: Progressing Goal: Ability to identify and utilize support systems that promote safety will improve 04/11/2020 1016 by Chalmers Cater, RN Outcome: Adequate for Discharge 04/11/2020 8469 by Chalmers Cater, RN Outcome: Progressing   Problem: Self-Concept: Goal: Will verbalize positive feelings about self 04/11/2020 1016 by Chalmers Cater, RN Outcome: Adequate for Discharge 04/11/2020 0950 by Chalmers Cater, RN Outcome: Progressing Goal: Level of anxiety will decrease 04/11/2020 1016 by Chalmers Cater, RN Outcome: Adequate for Discharge 04/11/2020 6295 by Chalmers Cater, RN Outcome: Progressing   Problem: Education: Goal: Ability to state activities that reduce stress will improve 04/11/2020 1016 by Chalmers Cater, RN Outcome: Adequate for Discharge 04/11/2020 0950 by Chalmers Cater, RN Outcome: Progressing   Problem: Education: Goal: Ability to make informed decisions regarding treatment will improve 04/11/2020 1016 by Chalmers Cater, RN Outcome: Adequate for Discharge 04/11/2020 2841 by Chalmers Cater, RN Outcome: Progressing   Problem: Coping: Goal: Coping ability will improve 04/11/2020 1016 by Chalmers Cater, RN Outcome: Adequate for Discharge 04/11/2020  0950 by Chalmers Cater, RN Outcome: Progressing   Problem: Health Behavior/Discharge Planning: Goal: Identification of resources available to assist in meeting health care needs will improve 04/11/2020 1016 by Chalmers Cater, RN Outcome: Adequate for Discharge 04/11/2020 3244 by Chalmers Cater, RN Outcome: Progressing   Problem: Medication: Goal: Compliance with prescribed medication regimen will improve 04/11/2020 1016 by Chalmers Cater, RN Outcome: Adequate for Discharge 04/11/2020 0950 by Chalmers Cater, RN Outcome: Progressing   Problem: Self-Concept: Goal: Ability to disclose and discuss suicidal ideas will improve 04/11/2020 1016 by Chalmers Cater, RN Outcome: Adequate for Discharge 04/11/2020 0102 by Chalmers Cater, RN Outcome: Progressing Goal: Will verbalize positive feelings about self 04/11/2020 1016 by Chalmers Cater, RN Outcome: Adequate for Discharge 04/11/2020 0950 by Chalmers Cater, RN Outcome: Progressing

## 2020-04-11 NOTE — BHH Counselor (Signed)
Patient was unable to be referred to home health PT as she is discharging to a homeless shelter.   Rachel Nolan. Algis Greenhouse, MSW, LCSW, LCAS 04/11/2020 1:01 PM

## 2020-04-11 NOTE — BHH Group Notes (Signed)
LCSW Group Therapy Note  04/11/2020 12:30 PM  Type of Therapy/Topic:  Group Therapy:  Emotion Regulation  Participation Level:  Did Not Attend   Description of Group:   The purpose of this group is to assist patients in learning to regulate negative emotions and experience positive emotions. Patients will be guided to discuss ways in which they have been vulnerable to their negative emotions. These vulnerabilities will be juxtaposed with experiences of positive emotions or situations, and patients will be challenged to use positive emotions to combat negative ones. Special emphasis will be placed on coping with negative emotions in conflict situations, and patients will process healthy conflict resolution skills.  Therapeutic Goals: 1. Patient will identify two positive emotions or experiences to reflect on in order to balance out negative emotions 2. Patient will label two or more emotions that they find the most difficult to experience 3. Patient will demonstrate positive conflict resolution skills through discussion and/or role plays  Summary of Patient Progress: X  Therapeutic Modalities:   Cognitive Behavioral Therapy Feelings Identification Dialectical Behavioral Therapy  Penni Homans, MSW, LCSW 04/11/2020 12:30 PM
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
## 2020-04-11 NOTE — BHH Counselor (Signed)
CSW spoke with the patient about her aftercare plans, reviewing that patient remains referred to [facility] however, they are not accepting patients until April.    CSW reviewed that pt has walk in information for [facility] on discharge paperwork as well  CSW reviewed the [facility] with the patient.  They meet both medical, behavioral and have a specialty clinic to address the patient's eye care needs.  Contact information is listed on discharge paperwork.    Copies of paperwork given to nurse to be given as pt discharges.  Penni Homans, MSW, LCSW 04/11/2020 10:28 AM

## 2020-04-11 NOTE — Plan of Care (Signed)
Pt denies depression, anxiety, SI, HI and AVH. Pt was educated on dc plan and verbalizes understanding. Torrie Mayers RN Problem: Education: Goal: Knowledge of [facility] Education information/materials will improve Outcome: Progressing Goal: Emotional status will improve Outcome: Progressing Goal: Mental status will improve Outcome: Progressing Goal: Verbalization of understanding the information provided will improve Outcome: Progressing   Problem: Activity: Goal: Interest or engagement in activities will improve Outcome: Progressing Goal: Sleeping patterns will improve Outcome: Progressing   Problem: Coping: Goal: Ability to verbalize frustrations and anger appropriately will improve Outcome: Progressing Goal: Ability to demonstrate self-control will improve Outcome: Progressing   Problem: Health Behavior/Discharge Planning: Goal: Identification of resources available to assist in meeting health care needs will improve Outcome: Progressing Goal: Compliance with treatment plan for underlying cause of condition will improve Outcome: Progressing   Problem: Physical Regulation: Goal: Ability to maintain clinical measurements within normal limits will improve Outcome: Progressing   Problem: Safety: Goal: Periods of time without injury will increase Outcome: Progressing   Problem: Education: Goal: Utilization of techniques to improve thought processes will improve Outcome: Progressing Goal: Knowledge of the prescribed therapeutic regimen will improve Outcome: Progressing   Problem: Activity: Goal: Interest or engagement in leisure activities will improve Outcome: Progressing Goal: Imbalance in normal sleep/wake cycle will improve Outcome: Progressing   Problem: Coping: Goal: Coping ability will improve Outcome: Progressing Goal: Will verbalize feelings Outcome: Progressing   Problem: Health Behavior/Discharge Planning: Goal: Ability to make decisions will  improve Outcome: Progressing Goal: Compliance with therapeutic regimen will improve Outcome: Progressing   Problem: Role Relationship: Goal: Will demonstrate positive changes in social behaviors and relationships Outcome: Progressing   Problem: Safety: Goal: Ability to disclose and discuss suicidal ideas will improve Outcome: Progressing Goal: Ability to identify and utilize support systems that promote safety will improve Outcome: Progressing   Problem: Self-Concept: Goal: Will verbalize positive feelings about self Outcome: Progressing Goal: Level of anxiety will decrease Outcome: Progressing   Problem: Education: Goal: Ability to state activities that reduce stress will improve Outcome: Progressing   Problem: Coping: Goal: Ability to identify and develop effective coping behavior will improve Outcome: Progressing   Problem: Self-Concept: Goal: Ability to identify factors that promote anxiety will improve Outcome: Progressing Goal: Level of anxiety will decrease Outcome: Progressing Goal: Ability to modify response to factors that promote anxiety will improve Outcome: Progressing   Problem: Education: Goal: Ability to make informed decisions regarding treatment will improve Outcome: Progressing   Problem: Coping: Goal: Coping ability will improve Outcome: Progressing   Problem: Health Behavior/Discharge Planning: Goal: Identification of resources available to assist in meeting health care needs will improve Outcome: Progressing   Problem: Medication: Goal: Compliance with prescribed medication regimen will improve Outcome: Progressing   Problem: Self-Concept: Goal: Ability to disclose and discuss suicidal ideas will improve Outcome: Progressing Goal: Will verbalize positive feelings about self Outcome: Progressing

## 2020-04-14 ENCOUNTER — Ambulatory Visit (HOSPITAL_COMMUNITY): Payer: No Typology Code available for payment source | Admitting: Psychiatry

## 2020-04-19 ENCOUNTER — Other Ambulatory Visit: Payer: Self-pay

## 2020-04-21 ENCOUNTER — Other Ambulatory Visit: Payer: Self-pay

## 2020-04-21 ENCOUNTER — Encounter (HOSPITAL_COMMUNITY): Payer: Self-pay

## 2020-04-21 ENCOUNTER — Emergency Department (HOSPITAL_COMMUNITY): Payer: Self-pay

## 2020-04-21 ENCOUNTER — Emergency Department (HOSPITAL_COMMUNITY)
Admission: EM | Admit: 2020-04-21 | Discharge: 2020-04-22 | Disposition: A | Discharge status: Home / Self Care | Payer: 59 | Attending: Emergency Medicine | Admitting: Emergency Medicine

## 2020-04-21 DIAGNOSIS — F1494 Cocaine use, unspecified with cocaine-induced mood disorder: Secondary | ICD-10-CM | POA: Diagnosis present

## 2020-04-21 DIAGNOSIS — F17210 Nicotine dependence, cigarettes, uncomplicated: Secondary | ICD-10-CM | POA: Insufficient documentation

## 2020-04-21 DIAGNOSIS — Z79899 Other long term (current) drug therapy: Secondary | ICD-10-CM | POA: Insufficient documentation

## 2020-04-21 DIAGNOSIS — M545 Low back pain, unspecified: Secondary | ICD-10-CM

## 2020-04-21 DIAGNOSIS — F1414 Cocaine abuse with cocaine-induced mood disorder: Secondary | ICD-10-CM | POA: Insufficient documentation

## 2020-04-21 DIAGNOSIS — I10 Essential (primary) hypertension: Secondary | ICD-10-CM | POA: Insufficient documentation

## 2020-04-21 DIAGNOSIS — G8929 Other chronic pain: Secondary | ICD-10-CM | POA: Insufficient documentation

## 2020-04-21 DIAGNOSIS — R45851 Suicidal ideations: Secondary | ICD-10-CM | POA: Insufficient documentation

## 2020-04-21 LAB — SALICYLATE LEVEL: Salicylate Lvl: <7 mg/dL — ABNORMAL LOW (ref 7.0–30.0)

## 2020-04-21 LAB — CBC
HCT: 36.4 % (ref 36.0–46.0)
Hemoglobin: 12.2 g/dL (ref 12.0–15.0)
MCH: 31.5 pg (ref 26.0–34.0)
MCHC: 33.5 g/dL (ref 30.0–36.0)
MCV: 94.1 fL (ref 80.0–100.0)
Platelets: 280 10*3/uL (ref 150–400)
RBC: 3.87 MIL/uL (ref 3.87–5.11)
RDW: 14.6 % (ref 11.5–15.5)
WBC: 6.3 10*3/uL (ref 4.0–10.5)
nRBC: 0 % (ref 0.0–0.2)

## 2020-04-21 LAB — COMPREHENSIVE METABOLIC PANEL
ALT: 14 U/L (ref 0–44)
AST: 16 U/L (ref 15–41)
Albumin: 3.8 g/dL (ref 3.5–5.0)
Alkaline Phosphatase: 77 U/L (ref 38–126)
Anion gap: 8 (ref 5–15)
BUN: 14 mg/dL (ref 6–20)
CO2: 27 mmol/L (ref 22–32)
Calcium: 9.5 mg/dL (ref 8.9–10.3)
Chloride: 108 mmol/L (ref 98–111)
Creatinine, Ser: 0.8 mg/dL (ref 0.44–1.00)
GFR, Estimated: >60 mL/min (ref >60)
Glucose, Bld: 105 mg/dL — ABNORMAL HIGH (ref 70–99)
Potassium: 4 mmol/L (ref 3.5–5.1)
Sodium: 143 mmol/L (ref 135–145)
Total Bilirubin: 0.3 mg/dL (ref 0.3–1.2)
Total Protein: 7.9 g/dL (ref 6.5–8.1)

## 2020-04-21 LAB — ACETAMINOPHEN LEVEL: Acetaminophen (Tylenol), Serum: <10 ug/mL — ABNORMAL LOW (ref 10–30)

## 2020-04-21 LAB — ETHANOL: Alcohol, Ethyl (B): <10 mg/dL (ref <10)

## 2020-04-21 MED ORDER — METHOCARBAMOL 500 MG PO TABS
1000.0000 mg | ORAL_TABLET | Freq: Once | ORAL | Status: AC
  Administered 2020-04-21: 1000 mg via ORAL
  Filled 2020-04-21: qty 2

## 2020-04-21 MED ORDER — PRAZOSIN HCL 1 MG PO CAPS
3.0000 mg | ORAL_CAPSULE | Freq: Every day | ORAL | Status: DC
  Administered 2020-04-21: 3 mg via ORAL
  Filled 2020-04-21: qty 3

## 2020-04-21 MED ORDER — ACETAMINOPHEN 500 MG PO TABS
1000.0000 mg | ORAL_TABLET | Freq: Once | ORAL | Status: AC
  Administered 2020-04-21: 1000 mg via ORAL
  Filled 2020-04-21: qty 2

## 2020-04-21 MED ORDER — FLUOXETINE HCL 20 MG PO CAPS: 60.0000 mg | ORAL_CAPSULE | Freq: Every day | ORAL | Status: DC

## 2020-04-21 MED ORDER — TRAZODONE HCL 100 MG PO TABS
150.0000 mg | ORAL_TABLET | Freq: Every day | ORAL | Status: DC
  Administered 2020-04-21: 150 mg via ORAL
  Filled 2020-04-21: qty 2

## 2020-04-21 MED ORDER — AMLODIPINE BESYLATE 5 MG PO TABS
10.0000 mg | ORAL_TABLET | Freq: Every day | ORAL | Status: DC
  Administered 2020-04-22: 10 mg via ORAL
  Filled 2020-04-21 (×2): qty 2

## 2020-04-21 MED ORDER — GABAPENTIN 300 MG PO CAPS
600.0000 mg | ORAL_CAPSULE | Freq: Three times a day (TID) | ORAL | Status: DC
  Administered 2020-04-21 – 2020-04-22 (×2): 600 mg via ORAL
  Filled 2020-04-21 (×2): qty 2

## 2020-04-21 MED ORDER — FLUOXETINE HCL 20 MG PO CAPS
60.0000 mg | ORAL_CAPSULE | Freq: Every day | ORAL | Status: DC
  Administered 2020-04-22: 60 mg via ORAL
  Filled 2020-04-21: qty 3

## 2020-04-21 MED ORDER — QUETIAPINE FUMARATE 300 MG PO TABS
400.0000 mg | ORAL_TABLET | Freq: Every day | ORAL | Status: DC
  Administered 2020-04-21: 400 mg via ORAL
  Filled 2020-04-21: qty 1

## 2020-04-21 NOTE — ED Notes (Signed)
TTS machine placed at bedside.   

## 2020-04-21 NOTE — ED Provider Notes (Signed)
1530: assumed care from previous EDPA, see previous note for full details. Plan is to follow up on labs to make sure they are normal. Needs TTS evaluation for SI with plan to OD on pills and hallucinations.  Patient is voluntary. Patient reported acute on chronic back pain. History of back surgery. X-ray without acute findings. Previous provider suspected chronic back pain, MSK pain and other etiologies of back pain like cauda equina, pyelo, abscess unlikely. No IVDU.    Physical Exam  BP 100/70 (BP Location: Right Arm)   Pulse 71   Temp 98.5 F (36.9 C) (Oral)   Resp 18   Ht 5\' 6"  (1.676 m)   Wt 85.7 kg   LMP 06/19/2018 (Exact Date)   SpO2 97%   BMI 30.51 kg/m   ED Course/Procedures   Clinical Course as of 04/21/20 2330  Mon Apr 21, 2020  1442 DG Lumbar Spine Complete Shows degenerative changes and hardware in place. No acute findings. [HK]  1530 Suicidal with plan to OD on meds, hallucinating Chronic back pain - h/o polysubstance abuse. No IVDU. X-ray unremarkable.  Plan to follow up on labs and TTS evaluation TTS order placed IVC paperwork filled by previous EDPA [CG]    Clinical Course User Index [CG] Apr 23, 2020, PA-C [HK] Liberty Handy, PA-C    Procedures  MDM   [phone]: Labs unremarkable. Patient medically cleared. TTS pending. She is voluntary thus far. Will order tylenol/robaxin for back pain. Will add urinalysis.   2330: TTS evaluation not yet completed. Plan is to follow up on TTS recommendations. Disposition per [facility].     [facility], PA-C 04/21/20 2330    2331, MD 04/22/20 [phone]

## 2020-04-21 NOTE — ED Notes (Signed)
Pt changed into burgundy scrubs and belongings collected.  2 pt belongings bags labeled and placed under triage nurses station ice machine.  Pt wanded by security.

## 2020-04-21 NOTE — ED Triage Notes (Addendum)
Per EMS- patient c/o chronic back pain. Patienat stated "I would rather end my life than to deal with this pain. I would be better off dead."  Patient states her plan for suicide is "to take all my pills." Patient also reports that she is hearing voices. Paatient states she smoked marijuana last night. Patient denies any alcohol or other drugs.
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
## 2020-04-22 DIAGNOSIS — F1494 Cocaine use, unspecified with cocaine-induced mood disorder: Secondary | ICD-10-CM | POA: Diagnosis not present

## 2020-04-22 NOTE — Consult Note (Signed)
[facility] Psych ED Discharge  04/22/2020 4:19 PM Rachel Nolan  MRN:  [id number] Principal Problem: Cocaine-induced mood disorder [facility]) Discharge Diagnoses: Principal Problem:   Cocaine-induced mood disorder (HCC)  Subjective: "I'm not suicidal. I did say that because my back hurt."  50 yo female with depression and homeless with substance abuse issues.  Recently in patient at [facility] and presented yesterday to the ED as she was having back pain and could not deal with the pain.  Prior to assessment, she was sleeping.  Awakened and denied suicidal ideations, stated it was the pain and does not feel this way now.  No homicidal ideations, hallucinations.  She was using cocaine prior to admission, minimizes use.  No withdrawal symptoms.  Psychiatrically stable for discharge with follow up at the [facility], information in her discharge instructions.  Total Time spent with patient: 45 minutes  Past Psychiatric History: depression and substance abuse  Past Medical History:  Past Medical History:  Diagnosis Date  . Anxiety   . Asthma   . Depression   . Hypertension   . Scoliosis     Past Surgical History:  Procedure Laterality Date  . BACK SURGERY    . ECTOPIC PREGNANCY SURGERY     Family History:  Family History  Problem Relation Age of Onset  . Hypertension Mother   . Hypertension Father    Family Psychiatric  History: unknown Social History:  Social History   Substance and Sexual Activity  Alcohol Use Yes     Social History   Substance and Sexual Activity  Drug Use Yes  . Types: Marijuana, Cocaine, "Crack" cocaine    Social History   Socioeconomic History  . Marital status: Widowed    Spouse name: Not on file  . Number of children: Not on file  . Years of education: Not on file  . Highest education level: Not on file  Occupational History  . Not on file  Tobacco Use  . Smoking status: Current Every Day Smoker    Packs/day: 0.50    Types: Cigarettes  . Smokeless tobacco:  Never Used  Vaping Use  . Vaping Use: Never used  Substance and Sexual Activity  . Alcohol use: Yes  . Drug use: Yes    Types: Marijuana, Cocaine, "Crack" cocaine  . Sexual activity: Not Currently  Other Topics Concern  . Not on file  Social History Narrative   Pt is homeless, no fixed address; not followed by an outpatient psychiatrist   Social Determinants of Health   Financial Resource Strain: Not on file  Food Insecurity: Not on file  Transportation Needs: Not on file  Physical Activity: Not on file  Stress: Not on file  Social Connections: Not on file    Has this patient used any form of tobacco in the last 30 days? (Cigarettes, Smokeless Tobacco, Cigars, and/or Pipes) A prescription for an FDA-approved tobacco cessation medication was offered at discharge and the patient refused  Current Medications: Current Facility-Administered Medications  Medication Dose Route Frequency Provider Last Rate Last Admin  . amLODipine (NORVASC) tablet 10 mg  10 mg Oral Daily Liberty Handy, PA-C   10 mg at 04/22/20 1348  . FLUoxetine (PROZAC) capsule 60 mg  60 mg Oral Daily Wynetta Fines, MD   60 mg at 04/22/20 1348  . gabapentin (NEURONTIN) capsule 600 mg  600 mg Oral TID Liberty Handy, PA-C   600 mg at 04/22/20 1348  . prazosin (MINIPRESS) capsule 3 mg  3  mg Oral QHS Liberty Handy, PA-C   3 mg at 04/21/20 2141  . QUEtiapine (SEROQUEL) tablet 400 mg  400 mg Oral QHS Liberty Handy, PA-C   400 mg at 04/21/20 2138  . traZODone (DESYREL) tablet 150 mg  150 mg Oral QHS Liberty Handy, PA-C   150 mg at 04/21/20 2139   Current Outpatient Medications  Medication Sig Dispense Refill  . amLODipine (NORVASC) 10 MG tablet Take 1 tablet (10 mg total) by mouth daily. 30 tablet 1  . FLUoxetine (PROZAC) 40 MG capsule Take 40 mg by mouth daily.    Marland Kitchen FLUoxetine (PROZAC) 20 MG capsule Take 3 capsules (60 mg total) by mouth daily. (Patient not taking: Reported on 04/21/2020) 90  capsule 1  . gabapentin (NEURONTIN) 300 MG capsule Take 2 capsules (600 mg total) by mouth 3 (three) times daily. (Patient taking differently: Take 300 mg by mouth 3 (three) times daily.) 180 capsule 1  . prazosin (MINIPRESS) 1 MG capsule Take 3 capsules (3 mg total) by mouth at bedtime. 90 capsule 1  . QUEtiapine (SEROQUEL) 400 MG tablet Take 1 tablet (400 mg total) by mouth at bedtime. 30 tablet 1  . traZODone (DESYREL) 150 MG tablet Take 1 tablet (150 mg total) by mouth at bedtime. 30 tablet 1   PTA Medications: (Not in a hospital admission)   Musculoskeletal: Strength & Muscle Tone: within normal limits Gait & Station: normal Patient leans: N/A  Psychiatric Specialty Exam:  Presentation  General Appearance:  Casual  Eye Contact: Good  Speech:Normal Rate  Speech Volume:Normal  Handedness:Right   Mood and Affect  Mood: mild depression  Affect: Blunted  Thought Process  Thought Processes:Coherent  Descriptions of Associations:Intact  Orientation:Full (Time, Place and Person)  Thought Content: WDL  History of Schizophrenia/Schizoaffective disorder:No  Duration of Psychotic Symptoms: NA  Hallucinations:NOne  Ideas of Reference:None  Suicidal Thoughts:None  Homicidal Thoughts: None  Sensorium  Memory:Immediate Fair  Judgment:Fair  Insight:Fair   Executive Functions  Concentration:Fair  Attention Span:Fair  Recall:Fair  Fund of Knowledge:Fair  Language:Fair   Psychomotor Activity  Psychomotor Activity:WDL  Assets  Assets:Communication Skills; Desire for Improvement   Sleep  Sleep: Good   Physical Exam: Physical Exam Vitals and nursing note reviewed.  Constitutional:      Appearance: Normal appearance.  HENT:     Head: Normocephalic.     Nose: Nose normal.  Pulmonary:     Effort: Pulmonary effort is normal.  Musculoskeletal:        General: Normal range of motion.     Cervical back: Normal range of motion.  Neurological:      General: No focal deficit present.     Mental Status: She is alert and oriented to person, place, and time.  Psychiatric:        Attention and Perception: Attention and perception normal.        Mood and Affect: Mood and affect normal.        Speech: Speech normal.        Behavior: Behavior normal. Behavior is cooperative.        Thought Content: Thought content normal.        Cognition and Memory: Cognition and memory normal.        Judgment: Judgment normal.    Review of Systems  Musculoskeletal: Positive for back pain.  All other systems reviewed and are negative.  Blood pressure (!) 114/104, pulse 82, temperature 98.4 F (36.9 C), temperature source Oral, resp.  rate (!) 22, height 5\' 6"  (1.676 m), weight 85.7 kg, last menstrual period 06/19/2018, SpO2 97 %. Body mass index is 30.51 kg/m.   Demographic Factors:  NA  Loss Factors: NA  Historical Factors: NA  Risk Reduction Factors:   Sense of responsibility to family and Positive therapeutic relationship  Continued Clinical Symptoms:  None  Cognitive Features That Contribute To Risk:  None    Suicide Risk:  Minimal: No identifiable suicidal ideation.  Patients presenting with no risk factors but with morbid ruminations; may be classified as minimal risk based on the severity of the depressive symptoms   Plan Of Care/Follow-up recommendations:  Cocaine abuse with cocaine induced mood disorder: -Refrain from alcohol and drug use -Follow up with [facility] -Attend 12 step program and obtain a sponsor -Continue Prozac 60 mg daily -Follow up with [facility]  Insomnia: -Continue Seroquel 400 mg at bedtime -Continue Trazodone 150 mg at bedtime  Nightmares: -Continue Prazosin 3 mg daily at bedtime Activity:  as tolerated Diet:  heart healthy diet  Disposition: discharge home 08/19/2018, NP 04/22/2020, 4:19 PM

## 2020-04-22 NOTE — Discharge Instructions (Signed)
[facility] Mental health clinic in [place], [place] Address: [address], [place], [place] [address] Hours:  Open 24 hours Phone: [phone] 

## 2020-04-22 NOTE — ED Notes (Signed)
Sitter at bedside. Pt resting comfortably. 

## 2020-04-22 NOTE — BH Assessment (Signed)
Disposition Rachel Asper, NP, recommends overnight observation for safety and stabilization with psych reassessment in the AM. Per NP, patient will remain at [facility] due to no appropriate bed at [facility]. Victorino Dike, Consulting civil engineer, informed of disposition.

## 2020-04-22 NOTE — ED Notes (Signed)
Pt requested social services to help with homelessness and transportation, SW notified

## 2020-04-22 NOTE — ED Notes (Signed)
Pt given homelessness resources and bus pass. Verbalized understanding of dc instructions, no s/s of distress. Belongings returned including black purse and cellphone.  Assisted to the lobby via w/c.

## 2020-04-22 NOTE — ED Provider Notes (Signed)
Emergency Medicine Observation Re-evaluation Note  Rachel Nolan is a 50 y.o. female, seen on rounds today.  Pt initially presented to the ED for complaints of Suicidal and Back Pain Currently, the patient is waiting for reassessment by psychiatry.  Physical Exam  BP 100/61 (BP Location: Right Arm)   Pulse 75   Temp 98.2 F (36.8 C) (Oral)   Resp 18   Ht 1.676 m (5\' 6" )   Wt 85.7 kg   LMP 06/19/2018 (Exact Date)   SpO2 98%   BMI 30.51 kg/m  Physical Exam General: Alert no acute distress Cardiac: Regular rate Lungs: The use only Psych: Depressed mood  ED Course / MDM  EKG:   I have reviewed the labs performed to date as well as medications administered while in observation.  Recent changes in the last 24 hours include initial evaluation by psychiatry.  Plan is for reassessment.  Plan  Current plan is for recheck by psychiatry.    08/19/2018, MD 04/22/20 [phone]

## 2020-04-23 ENCOUNTER — Ambulatory Visit (HOSPITAL_COMMUNITY)
Admission: EM | Admit: 2020-04-23 | Discharge: 2020-04-23 | Disposition: A | Discharge status: Other Acute Inpatient Hospital | Payer: No Payment, Other | Attending: Psychiatry | Admitting: Psychiatry

## 2020-04-23 ENCOUNTER — Encounter (HOSPITAL_COMMUNITY): Payer: Self-pay | Admitting: Emergency Medicine

## 2020-04-23 ENCOUNTER — Emergency Department (HOSPITAL_COMMUNITY)
Admission: EM | Admit: 2020-04-23 | Discharge: 2020-04-25 | Disposition: A | Discharge status: Home / Self Care | Payer: Self-pay | Attending: Emergency Medicine | Admitting: Emergency Medicine

## 2020-04-23 ENCOUNTER — Other Ambulatory Visit: Payer: Self-pay

## 2020-04-23 DIAGNOSIS — Z79899 Other long term (current) drug therapy: Secondary | ICD-10-CM | POA: Insufficient documentation

## 2020-04-23 DIAGNOSIS — J45909 Unspecified asthma, uncomplicated: Secondary | ICD-10-CM | POA: Insufficient documentation

## 2020-04-23 DIAGNOSIS — F332 Major depressive disorder, recurrent severe without psychotic features: Secondary | ICD-10-CM

## 2020-04-23 DIAGNOSIS — I10 Essential (primary) hypertension: Secondary | ICD-10-CM | POA: Insufficient documentation

## 2020-04-23 DIAGNOSIS — Z20822 Contact with and (suspected) exposure to covid-19: Secondary | ICD-10-CM | POA: Insufficient documentation

## 2020-04-23 DIAGNOSIS — F1490 Cocaine use, unspecified, uncomplicated: Secondary | ICD-10-CM | POA: Insufficient documentation

## 2020-04-23 DIAGNOSIS — F1994 Other psychoactive substance use, unspecified with psychoactive substance-induced mood disorder: Secondary | ICD-10-CM | POA: Diagnosis present

## 2020-04-23 DIAGNOSIS — F333 Major depressive disorder, recurrent, severe with psychotic symptoms: Secondary | ICD-10-CM | POA: Insufficient documentation

## 2020-04-23 DIAGNOSIS — F4321 Adjustment disorder with depressed mood: Secondary | ICD-10-CM | POA: Diagnosis present

## 2020-04-23 DIAGNOSIS — F1410 Cocaine abuse, uncomplicated: Secondary | ICD-10-CM

## 2020-04-23 DIAGNOSIS — R45851 Suicidal ideations: Secondary | ICD-10-CM | POA: Diagnosis not present

## 2020-04-23 DIAGNOSIS — Z5900 Homelessness unspecified: Secondary | ICD-10-CM | POA: Insufficient documentation

## 2020-04-23 DIAGNOSIS — R45850 Homicidal ideations: Secondary | ICD-10-CM | POA: Insufficient documentation

## 2020-04-23 DIAGNOSIS — Z9151 Personal history of suicidal behavior: Secondary | ICD-10-CM | POA: Insufficient documentation

## 2020-04-23 DIAGNOSIS — F4310 Post-traumatic stress disorder, unspecified: Secondary | ICD-10-CM | POA: Insufficient documentation

## 2020-04-23 DIAGNOSIS — F17210 Nicotine dependence, cigarettes, uncomplicated: Secondary | ICD-10-CM | POA: Insufficient documentation

## 2020-04-23 DIAGNOSIS — F1924 Other psychoactive substance dependence with psychoactive substance-induced mood disorder: Secondary | ICD-10-CM | POA: Insufficient documentation

## 2020-04-23 DIAGNOSIS — Z7901 Long term (current) use of anticoagulants: Secondary | ICD-10-CM | POA: Insufficient documentation

## 2020-04-23 LAB — URINALYSIS, ROUTINE W REFLEX MICROSCOPIC
Bilirubin Urine: NEGATIVE
Glucose, UA: NEGATIVE mg/dL
Hgb urine dipstick: NEGATIVE
Ketones, ur: NEGATIVE mg/dL
Leukocytes,Ua: NEGATIVE
Nitrite: NEGATIVE
Protein, ur: NEGATIVE mg/dL
Specific Gravity, Urine: 1.024 (ref 1.005–1.030)
pH: 6 (ref 5.0–8.0)

## 2020-04-23 LAB — RAPID URINE DRUG SCREEN, HOSP PERFORMED
Amphetamines: NOT DETECTED
Barbiturates: NOT DETECTED
Benzodiazepines: NOT DETECTED
Cocaine: POSITIVE — AB
Opiates: NOT DETECTED
Tetrahydrocannabinol: NOT DETECTED

## 2020-04-23 LAB — RESP PANEL BY RT-PCR (FLU A&B, COVID) ARPGX2
Influenza A by PCR: NEGATIVE
Influenza B by PCR: NEGATIVE
SARS Coronavirus 2 by RT PCR: NEGATIVE

## 2020-04-23 MED ORDER — QUETIAPINE FUMARATE 300 MG PO TABS
400.0000 mg | ORAL_TABLET | Freq: Every day | ORAL | Status: DC
  Administered 2020-04-23 – 2020-04-24 (×2): 400 mg via ORAL
  Filled 2020-04-23 (×2): qty 1

## 2020-04-23 MED ORDER — PRAZOSIN HCL 1 MG PO CAPS
3.0000 mg | ORAL_CAPSULE | Freq: Every day | ORAL | Status: DC
  Administered 2020-04-23 – 2020-04-24 (×2): 3 mg via ORAL
  Filled 2020-04-23 (×2): qty 3

## 2020-04-23 MED ORDER — GABAPENTIN 300 MG PO CAPS
300.0000 mg | ORAL_CAPSULE | Freq: Three times a day (TID) | ORAL | Status: DC
  Administered 2020-04-23 – 2020-04-25 (×6): 300 mg via ORAL
  Filled 2020-04-23 (×6): qty 1

## 2020-04-23 MED ORDER — TRAZODONE HCL 50 MG PO TABS
150.0000 mg | ORAL_TABLET | Freq: Every day | ORAL | Status: DC
  Administered 2020-04-23 – 2020-04-24 (×2): 150 mg via ORAL
  Filled 2020-04-23 (×2): qty 1

## 2020-04-23 MED ORDER — FLUOXETINE HCL 20 MG PO CAPS
60.0000 mg | ORAL_CAPSULE | Freq: Every day | ORAL | Status: DC
  Administered 2020-04-23 – 2020-04-25 (×3): 60 mg via ORAL
  Filled 2020-04-23 (×3): qty 3

## 2020-04-23 NOTE — ED Notes (Signed)
Report called to Kennyth Arnold, charge RN @[facility]  for transfer from [facility]

## 2020-04-23 NOTE — ED Notes (Signed)
Pt already dressed in purple scrubs from [facility].  Pt belongings collected  I none pt belonging bag, labeled and stored at triage nurses station.  Pt wanded by security.

## 2020-04-23 NOTE — ED Notes (Signed)
Pt to room 31. Pt calm, cooperative, no s/s of distress. Pt oriented to unit and room.  Walker given to pt

## 2020-04-23 NOTE — BH Assessment (Addendum)
Disposition: Per Earlene Plater, MD patient is recommended for geri psych admission at this time given need for a walker.  Disposition Counselor faxed patient out to the hospitals listed below for consideration of bed placement.     [facility]      [facility] Details     [facility] Details     [facility] Details     [facility] Details     [facility] Details     [facility] Details     [facility] Details     [facility] Details     [facility] Details     [facility] Details     [facility] Details     [facility] Details     [facility] Details     [facility] Details     [facility] Details     [facility] Details     [facility] Details     [facility] Details     [facility] Details     [facility] Details     [facility] Details     [facility] Details     [facility] Details     [facility]

## 2020-04-23 NOTE — ED Notes (Signed)
[facility] non-emergency called
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
## 2020-04-23 NOTE — ED Triage Notes (Signed)
BIBA Pt coming from [facility] with SI. Pt unable to stay at [facility] due to needing a walker.  118/88 72HR 16RR 96% RA

## 2020-04-23 NOTE — Discharge Instructions (Addendum)
To [facility] ED

## 2020-04-23 NOTE — ED Provider Notes (Signed)
[facility]-EMERGENCY DEPT Provider Note   CSN: [id number] Arrival date & time: 04/23/20  1208     History Chief Complaint  Patient presents with  . Suicidal    Rachel Nolan is a 50 y.o. female.  Patient was seen and medically cleared on 04/21/20, and discharged home after TTS consult. Patient seen at [facility] today, with SI/HI and mild psychotic features. Patient evaluated by psychiatry, was unable to contract for safety. As patient requires a walker to ambulate, [facility] was not able to accommodate the patient. Patient sent to the ED by [facility] provider. Patient will likely need geri-psych facility. [facility] is not currently able to accept patient. Patient is cooperative. No change in underlying medical issues since evaluation on 04/21/20.  The history is provided by medical records and the patient. No language interpreter was used.  Mental Health Problem Presenting symptoms: homicidal ideas and suicidal thoughts   Degree of incapacity (severity):  Moderate Onset quality:  Unable to specify Timing:  Unable to specify Chronicity:  Recurrent Context: stressful life event        Past Medical History:  Diagnosis Date  . Anxiety   . Asthma   . Depression   . Hypertension   . Scoliosis     Patient Active Problem List   Diagnosis Date Noted  . 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  . HTN (hypertension) 03/26/2020  . Neuropathic pain 03/26/2020  . Severe recurrent major depression with psychotic features (HCC) 03/25/2020  . MDD (major depressive disorder), recurrent, severe, with psychosis (HCC) 01/24/2020  . Stimulant use disorder 01/24/2020  . Alcohol use disorder, severe, dependence (HCC) 12/06/2019  . MDD (major depressive disorder), recurrent severe, without psychosis (HCC) 12/05/2019  . Alcohol withdrawal (HCC) 11/28/2019  . Suicidal ideation 11/28/2019  . Cocaine dependence (HCC) 03/05/2019  . Major depressive disorder, recurrent episode, severe (HCC)  03/02/2019  . Post traumatic stress disorder (PTSD) 03/02/2019  . Cocaine use disorder (HCC) 03/02/2019  . Depression 03/02/2019  . MDD (major depressive disorder) 03/01/2019    Past Surgical History:  Procedure Laterality Date  . BACK SURGERY    . ECTOPIC PREGNANCY SURGERY       OB History   No obstetric history on file.     Family History  Problem Relation Age of Onset  . Hypertension Mother   . Hypertension Father     Social History   Tobacco Use  . Smoking status: Current Every Day Smoker    Packs/day: 0.50    Types: Cigarettes  . Smokeless tobacco: Never Used  Vaping Use  . Vaping Use: Never used  Substance Use Topics  . Alcohol use: Yes  . Drug use: Yes    Types: Marijuana, Cocaine, "Crack" cocaine    Home Medications Prior to Admission medications   Medication Sig Start Date End Date Taking? Authorizing Provider  amLODipine (NORVASC) 10 MG tablet Take 1 tablet (10 mg total) by mouth daily. 04/11/20   Jesse Sans, MD  gabapentin (NEURONTIN) 300 MG capsule Take 2 capsules (600 mg total) by mouth 3 (three) times daily. Patient taking differently: Take 300 mg by mouth 3 (three) times daily. 04/11/20   Jesse Sans, MD  prazosin (MINIPRESS) 1 MG capsule Take 3 capsules (3 mg total) by mouth at bedtime. 04/11/20   Jesse Sans, MD  QUEtiapine (SEROQUEL) 400 MG tablet Take 1 tablet (400 mg total) by mouth at bedtime. 04/11/20   Jesse Sans, MD  traZODone (DESYREL) 150 MG tablet  Take 1 tablet (150 mg total) by mouth at bedtime. 04/11/20   Jesse Sans, MD  citalopram (CELEXA) 20 MG tablet Take 1 tablet (20 mg total) by mouth daily. 03/07/19 07/23/19  Aldean Baker, NP  lisinopril (ZESTRIL) 10 MG tablet Take 1 tablet (10 mg total) by mouth daily. 03/07/19 09/11/19  Aldean Baker, NP  pantoprazole (PROTONIX) 40 MG tablet Take 1 tablet (40 mg total) by mouth daily. 03/07/19 07/23/19  Aldean Baker, NP    Allergies    Patient has no known  allergies.  Review of Systems   Review of Systems  Musculoskeletal: Positive for back pain.  Psychiatric/Behavioral: Positive for homicidal ideas and suicidal ideas.  All other systems reviewed and are negative.   Physical Exam Updated Vital Signs BP (!) 140/93 (BP Location: Left Arm)   Pulse 72   Temp 98.5 F (36.9 C) (Oral)   Resp 18   Ht 5\' 6"  (1.676 m)   Wt 85.7 kg   LMP 06/19/2018 (Exact Date)   SpO2 100%   BMI 30.49 kg/m   Physical Exam Vitals and nursing note reviewed.  HENT:     Head: Normocephalic.     Nose: Nose normal.     Mouth/Throat:     Mouth: Mucous membranes are moist.  Eyes:     Conjunctiva/sclera: Conjunctivae normal.  Cardiovascular:     Rate and Rhythm: Normal rate.  Pulmonary:     Effort: Pulmonary effort is normal.  Abdominal:     Palpations: Abdomen is soft.  Skin:    General: Skin is warm and dry.  Neurological:     Mental Status: She is alert and oriented to person, place, and time.  Psychiatric:        Speech: Speech normal.        Behavior: Behavior is cooperative.        Thought Content: Thought content includes homicidal and suicidal ideation.     ED Results / Procedures / Treatments   Labs (all labs ordered are listed, but only abnormal results are displayed) Labs Reviewed  RAPID URINE DRUG SCREEN, HOSP PERFORMED - Abnormal; Notable for the following components:      Result Value   Cocaine POSITIVE (*)    All other components within normal limits  RESP PANEL BY RT-PCR (FLU A&B, COVID) ARPGX2  URINALYSIS, ROUTINE W REFLEX MICROSCOPIC    EKG None  Radiology DG Lumbar Spine Complete  Result Date: 04/21/2020 CLINICAL DATA:  Back pain. EXAM: LUMBAR SPINE - COMPLETE 4+ VIEW COMPARISON:  09/15/2011 FINDINGS: Convex leftward lumbar scoliosis again noted. Single right-sided fusion rod extends from T9-L4, similar to prior. Bones are diffusely demineralized with extensive fusion of the posterior elements along the spinal hardware.  Degenerative changes are noted at the L4-5 and L5-S1 level with substantial loss of disc space at L5-S1 grade 1-2 anterolisthesis of L5 on S1. IMPRESSION: No substantial interval change.  No acute abnormality. Convex leftward lumbar scoliosis with extensive fusion of the posterior elements from T9-L4. Advanced degenerative changes at L4-5 and L5-S1. Electronically Signed   By: 09/17/2011 M.D.   On: 04/21/2020 14:32    Procedures Procedures   Medications Ordered in ED Medications - No data to display  ED Course  I have reviewed the triage vital signs and the nursing notes.  Pertinent labs & imaging results that were available during my care of the patient were reviewed by me and considered in my medical decision making (see chart for  details).  Per psychiatrist note, patient is unable to contract for safety and should not be sent home. Due to requirement for ambulation assistance with walker, [facility] is not able to manage. Patient will likely need geri-psych admission per psychiatry. Our social worker is unable to place patients into geri-psych. Labs from visit on 04/21/20 reviewed. Will recheck urine and urine drug screen today. Patient is otherwise medically cleared. TTS consult requested to assist with placement.    MDM Rules/Calculators/A&P                          Patient medically cleared. Awaiting placement. Final Clinical Impression(s) / ED Diagnoses Final diagnoses:  Suicidal ideation    Rx / DC Orders ED Discharge Orders    None       Felicie Morn, NP 04/23/20 2221    Rozelle Logan, DO 04/24/20 1017
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
## 2020-04-24 DIAGNOSIS — F4321 Adjustment disorder with depressed mood: Secondary | ICD-10-CM

## 2020-04-24 NOTE — Progress Notes (Signed)
[facility] called and reported that there are no beds currently.  Reene Harlacher, MSW, LCSW 04/24/2020 10:26 AM  
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
## 2020-04-25 ENCOUNTER — Emergency Department (HOSPITAL_COMMUNITY): Admission: EM | Admit: 2020-04-25 | Discharge: 2020-04-25 | Discharge status: Absent without leave | Payer: MEDICAID

## 2020-04-25 NOTE — Progress Notes (Signed)
..   Transition of Care [facility]) - Emergency Department Mini Assessment   Patient Details  Name: Rachel Nolan MRN: [id number] Date of Birth: August 02, 1970  Transition of Care [facility]) CM/SW Contact:    Manish Ruggiero C Tarpley-Carter, LCSWA Phone Number: 04/25/2020, 11:16 AM   Clinical Narrative: TOC CSW consulted with pt.  Pt is in need of shelter and substance use assistance.  CSW will connect pt with [facility] and seek shelter.  CSW will provide transportation to [facility].  CSW will also reach out to [employer] for assistance with Medicaid.  CSW will continue to follow for dc needs.  Burch Marchuk Tarpley-Carter, MSW, LCSW-A Pronouns:  She, Her, Hers                  [facility] ED Transitions of CareClinical Social Worker [email or web address] [phone]   ED Mini Assessment: What brought you to the Emergency Department? : Mental Health  Barriers to Discharge: No Barriers Identified     Means of departure: Car  Interventions which prevented an admission or readmission: Homeless Screening,Other (must enter comment) (Substance Use ([facility]))    Patient Contact and Communications       Contact Date: 04/25/20,          Patient states their goals for this hospitalization and ongoing recovery are:: Pt is seeking shelter and assistance with substance use.   Choice offered to / list presented to : NA  Admission diagnosis:  SI Patient Active Problem List   Diagnosis Date Noted  . Adjustment disorder with depressed mood 04/24/2020  . 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  . HTN (hypertension) 03/26/2020  . Neuropathic pain 03/26/2020  . Severe recurrent major depression with psychotic features (HCC) 03/25/2020  . MDD (major depressive disorder), recurrent, severe, with psychosis (HCC) 01/24/2020  . Stimulant use disorder 01/24/2020  . Alcohol use disorder, severe, dependence (HCC) 12/06/2019  . MDD (major depressive disorder), recurrent  severe, without psychosis (HCC) 12/05/2019  . Alcohol withdrawal (HCC) 11/28/2019  . Suicidal ideation 11/28/2019  . Cocaine dependence (HCC) 03/05/2019  . Major depressive disorder, recurrent episode, severe (HCC) 03/02/2019  . Post traumatic stress disorder (PTSD) 03/02/2019  . Cocaine use disorder (HCC) 03/02/2019  . Depression 03/02/2019  . Substance induced mood disorder (HCC) 03/02/2019  . MDD (major depressive disorder) 03/01/2019   PCP:  Claiborne Rigg, NP Pharmacy:   [facility] [address] [place] [address] Phone: [phone] Fax: [phone]

## 2020-04-25 NOTE — ED Provider Notes (Signed)
Emergency Medicine Observation Re-evaluation Note  Rachel Nolan is a 50 y.o. female, seen on rounds today.  Pt initially presented to the ED for complaints of Suicidal Currently, the patient is awaiting psychiatric disposition.  Physical Exam  BP 121/89 (BP Location: Right Arm)   Pulse 70   Temp 98.6 F (37 C) (Oral)   Resp 18   Ht 5\' 6"  (1.676 m)   Wt 85.7 kg   LMP 06/19/2018 (Exact Date)   SpO2 96%   BMI 30.49 kg/m  Physical Exam General: resting comfortably Cardiac: normal HR Lungs: normal effort Psych: no acute psychosis  ED Course / MDM  EKG:   I have reviewed the labs performed to date as well as medications administered while in observation.  Recent changes in the last 24 hours include none.  Plan  Current plan is for [facility] this morning?. Patient is not under full IVC at this time.   [facility], MD 04/25/20 [phone]

## 2020-04-25 NOTE — ED Provider Notes (Signed)
Psychiatry has cleared patient for discharge.  Social work has helped find her a place to stay.  She will be discharged.   Pricilla Loveless, MD 04/25/20 1330

## 2020-04-25 NOTE — Consult Note (Addendum)
Telepsych Consultation   Reason for Consult:  Psych Consult Referring Physician:  Felicie Morn, NP Location of Patient: [place] [facility] Location of Provider: Behavioral Health TTS Department  Patient Identification: Rylan Kaufmann MRN:  [id number] Principal Diagnosis: Adjustment disorder with depressed mood Diagnosis:  Principal Problem:   Adjustment disorder with depressed mood Active Problems:   Substance induced mood disorder (HCC)   Total Time spent with patient: 15 minutes  Subjective:   Avah Bashor is a 50 y.o. female patient admitted with .  On assessment patient presents lying in bed; alert and oriented. Patient endorses ongoing depression regarding currently homeless situation. Provider discussed discharge plan noted from day prior; patient agreed.  Patient expressed frustrations of current situation and explained how homelessness affects her mental health; provider discussed [facility] services offered and the importance of outpatient follow-up for adequate symptom management; patient verbalized understanding.  Patient denies any active suicidal or homicidal ideations, auditory or visual hallucinations, and does not appear to be responding to any external/internal stimuli at this time. Social work consult placed for assistance in patient transition from ED to [facility].   Per TOC note:  " TOC CSW consulted with pt.  Pt is in need of shelter and substance use assistance.  CSW will connect pt with [facility] and seek shelter.  CSW will provide transportation to [facility].  CSW will also reach out to [employer] for assistance with Medicaid."  HPI:   Draven Laine is a 51 year old female who presented to [facility] for chronic homelessness and chronic suicidal thoughts after initially presenting to [facility] and unable to contract for safety. Patient was sent to [facility] due to walker use. Patient was seen and medically cleared on 04/21/20 for discharge after TTS consult. Patient was reporting homicidal  thoughts of wanting to "burn down" the home her children live in [place] and chronic suicidal ideations with no plan. Patient has past psychiatric history of substance induced mood disorder, cocaine use disorder, and alcohol use; UDS + cocaine.   Past Psychiatric History:   -Adjustment disorder with depressed mood  -Substance induced mood disorder  -Cocaine use disorder  -PTSD   Risk to Self:  pt denies Risk to Others:  pt denies Prior Inpatient Therapy:  yes Prior Outpatient Therapy:  yes  Past Medical History:  Past Medical History:  Diagnosis Date  . Anxiety   . Asthma   . Depression   . Hypertension   . Scoliosis     Past Surgical History:  Procedure Laterality Date  . BACK SURGERY    . ECTOPIC PREGNANCY SURGERY     Family History:  Family History  Problem Relation Age of Onset  . Hypertension Mother   . Hypertension Father    Family Psychiatric  History: not noted Social History:  Social History   Substance and Sexual Activity  Alcohol Use Yes     Social History   Substance and Sexual Activity  Drug Use Yes  . Types: Marijuana, Cocaine, "Crack" cocaine    Social History   Socioeconomic History  . Marital status: Widowed    Spouse name: Not on file  . Number of children: Not on file  . Years of education: Not on file  . Highest education level: Not on file  Occupational History  . Not on file  Tobacco Use  . Smoking status: Current Every Day Smoker    Packs/day: 0.50    Types: Cigarettes  . Smokeless tobacco: Never Used  Vaping Use  . Vaping Use:  Never used  Substance and Sexual Activity  . Alcohol use: Yes  . Drug use: Yes    Types: Marijuana, Cocaine, "Crack" cocaine  . Sexual activity: Not Currently  Other Topics Concern  . Not on file  Social History Narrative   Pt is homeless, no fixed address; not followed by an outpatient psychiatrist   Social Determinants of Health   Financial Resource Strain: Not on file  Food Insecurity:  Not on file  Transportation Needs: Not on file  Physical Activity: Not on file  Stress: Not on file  Social Connections: Not on file   Additional Social History:  -homeless  -substance abuse  Allergies:  No Known Allergies  Labs:  Results for orders placed or performed during the hospital encounter of 04/23/20 (from the past 48 hour(s))  Resp Panel by RT-PCR (Flu A&B, Covid) Nasopharyngeal Swab     Status: None   Collection Time: 04/23/20  2:07 PM   Specimen: Nasopharyngeal Swab; Nasopharyngeal(NP) swabs in vial transport medium  Result Value Ref Range   SARS Coronavirus 2 by RT PCR NEGATIVE NEGATIVE    Comment: (NOTE) SARS-CoV-2 target nucleic acids are NOT DETECTED.  The SARS-CoV-2 RNA is generally detectable in upper respiratory specimens during the acute phase of infection. The lowest concentration of SARS-CoV-2 viral copies this assay can detect is 138 copies/mL. A negative result does not preclude SARS-Cov-2 infection and should not be used as the sole basis for treatment or other patient management decisions. A negative result may occur with  improper specimen collection/handling, submission of specimen other than nasopharyngeal swab, presence of viral mutation(s) within the areas targeted by this assay, and inadequate number of viral copies(<138 copies/mL). A negative result must be combined with clinical observations, patient history, and epidemiological information. The expected result is Negative.  Fact Sheet for Patients:  BloggerCourse.com  Fact Sheet for Healthcare Providers:  SeriousBroker.it  This test is no t yet approved or cleared by the [place] FDA and  has been authorized for detection and/or diagnosis of SARS-CoV-2 by FDA under an Emergency Use Authorization (EUA). This EUA will remain  in effect (meaning this test can be used) for the duration of the COVID-19 declaration under Section 564(b)(1)  of the Act, 21 U.S.C.section 360bbb-3(b)(1), unless the authorization is terminated  or revoked sooner.       Influenza A by PCR NEGATIVE NEGATIVE   Influenza B by PCR NEGATIVE NEGATIVE    Comment: (NOTE) The Xpert Xpress SARS-CoV-2/FLU/RSV plus assay is intended as an aid in the diagnosis of influenza from Nasopharyngeal swab specimens and should not be used as a sole basis for treatment. Nasal washings and aspirates are unacceptable for Xpert Xpress SARS-CoV-2/FLU/RSV testing.  Fact Sheet for Patients: BloggerCourse.com  Fact Sheet for Healthcare Providers: SeriousBroker.it  This test is not yet approved or cleared by the [place] FDA and has been authorized for detection and/or diagnosis of SARS-CoV-2 by FDA under an Emergency Use Authorization (EUA). This EUA will remain in effect (meaning this test can be used) for the duration of the COVID-19 declaration under Section 564(b)(1) of the Act, 21 U.S.C. section 360bbb-3(b)(1), unless the authorization is terminated or revoked.  Performed at [facility], [address], [place], [place] [address]   Urinalysis, Routine w reflex microscopic Urine, Clean Catch     Status: None   Collection Time: 04/23/20  8:50 PM  Result Value Ref Range   Color, Urine YELLOW YELLOW   APPearance CLEAR CLEAR  Specific Gravity, Urine 1.024 1.005 - 1.030   pH 6.0 5.0 - 8.0   Glucose, UA NEGATIVE NEGATIVE mg/dL   Hgb urine dipstick NEGATIVE NEGATIVE   Bilirubin Urine NEGATIVE NEGATIVE   Ketones, ur NEGATIVE NEGATIVE mg/dL   Protein, ur NEGATIVE NEGATIVE mg/dL   Nitrite NEGATIVE NEGATIVE   Leukocytes,Ua NEGATIVE NEGATIVE    Comment: Performed at [facility], [address] [place], [place] [address]  Urine rapid drug screen (hosp performed)     Status: Abnormal   Collection Time: 04/23/20  8:50 PM  Result Value Ref Range   Opiates NONE DETECTED  NONE DETECTED   Cocaine POSITIVE (A) NONE DETECTED   Benzodiazepines NONE DETECTED NONE DETECTED   Amphetamines NONE DETECTED NONE DETECTED   Tetrahydrocannabinol NONE DETECTED NONE DETECTED   Barbiturates NONE DETECTED NONE DETECTED    Comment: (NOTE) DRUG SCREEN FOR MEDICAL PURPOSES ONLY.  IF CONFIRMATION IS NEEDED FOR ANY PURPOSE, NOTIFY LAB WITHIN 5 DAYS.  LOWEST DETECTABLE LIMITS FOR URINE DRUG SCREEN Drug Class                     Cutoff (ng/mL) Amphetamine and metabolites    1000 Barbiturate and metabolites    200 Benzodiazepine                 200 Tricyclics and metabolites     300 Opiates and metabolites        300 Cocaine and metabolites        300 THC                            50 Performed at [facility], [address] [place], [place] [address]     Medications:  Current Facility-Administered Medications  Medication Dose Route Frequency Provider Last Rate Last Admin  . FLUoxetine (PROZAC) capsule 60 mg  60 mg Oral Daily Felicie MornSmith, David, NP   60 mg at 04/25/20 [id number]  . gabapentin (NEURONTIN) capsule 300 mg  300 mg Oral TID Felicie MornSmith, David, NP   300 mg at 04/25/20 [id number]  . prazosin (MINIPRESS) capsule 3 mg  3 mg Oral QHS Felicie MornSmith, David, NP   3 mg at 04/24/20 2135  . QUEtiapine (SEROQUEL) tablet 400 mg  400 mg Oral QHS Felicie MornSmith, David, NP   400 mg at 04/24/20 2134  . traZODone (DESYREL) tablet 150 mg  150 mg Oral QHS Felicie MornSmith, David, NP   150 mg at 04/24/20 2135   Current Outpatient Medications  Medication Sig Dispense Refill  . amLODipine (NORVASC) 10 MG tablet Take 1 tablet (10 mg total) by mouth daily. 30 tablet 1  . gabapentin (NEURONTIN) 300 MG capsule Take 2 capsules (600 mg total) by mouth 3 (three) times daily. (Patient taking differently: Take 300 mg by mouth 3 (three) times daily.) 180 capsule 1  . prazosin (MINIPRESS) 1 MG capsule Take 3 capsules (3 mg total) by mouth at bedtime. 90 capsule 1  . QUEtiapine (SEROQUEL) 400 MG tablet Take 1 tablet (400  mg total) by mouth at bedtime. 30 tablet 1  . traZODone (DESYREL) 150 MG tablet Take 1 tablet (150 mg total) by mouth at bedtime. 30 tablet 1   Musculoskeletal: Strength & Muscle Tone: decreased Gait & Station: normal Patient leans: N/A  Psychiatric Specialty Exam: Physical Exam Vitals and nursing note reviewed.  Psychiatric:        Attention and Perception: Attention and perception normal.  Mood and Affect: Mood and affect normal.        Speech: Speech normal.        Behavior: Behavior is cooperative.        Thought Content: Thought content normal.        Cognition and Memory: Cognition and memory normal.        Judgment: Judgment normal.     Review of Systems  Psychiatric/Behavioral: Positive for dysphoric mood.  All other systems reviewed and are negative.   Blood pressure 121/89, pulse 70, temperature 98.6 F (37 C), temperature source Oral, resp. rate 18, height 5\' 6"  (1.676 m), weight 85.7 kg, last menstrual period 06/19/2018, SpO2 96 %.Body mass index is 30.49 kg/m.  General Appearance: Casual  Eye Contact:  Fair  Speech:  Clear and Coherent  Volume:  Normal  Mood:  Dysphoric  Affect:  Blunt and Congruent  Thought Process:  Goal Directed  Orientation:  Full (Time, Place, and Person)  Thought Content:  Logical  Suicidal Thoughts:  No  Homicidal Thoughts:  No  Memory:  Immediate;   Fair Recent;   Fair Remote;   Fair  Judgement:  Fair  Insight:  Present and Shallow  Psychomotor Activity:  Normal  Concentration:  Concentration: Fair and Attention Span: Fair  Recall:  08/19/2018 of Knowledge:  Fair  Language:  Fair  Akathisia:  NA  Handed:    AIMS (if indicated):     Assets:  Communication Skills Desire for Improvement Physical Health Resilience  ADL's:  Intact  Cognition:  WNL  Sleep:      Treatment Plan Summary: Plan discharge patient with outpatient psychiatric and substance abuse resources. Social work consulted to assist with shelter and  transportation needs.   Disposition: No evidence of imminent risk to self or others at present.   Patient does not meet criteria for psychiatric inpatient admission. Discussed crisis plan, support from social network, calling 911, coming to the Emergency Department, and calling Suicide Hotline.  This service was provided via telemedicine using a 2-way, interactive audio and video technology.  Names of all persons participating in this telemedicine service and their role in this encounter. Name: [employer] Role: PMHNP  Name: Maxie Barb Role: Attending MD  Name: Nelly Rout Role: patient  Name:  Role:     [name], NP 04/25/2020 10:11 AM

## 2020-04-25 NOTE — Discharge Instructions (Signed)
Substance Abuse Treatment Programs ° °Intensive Outpatient Programs °[facility]     °[address]      °[place], [place]                   °[phone]      ° °[facility] °[address] °[place], [place] °[phone] ° °[facility]     °(Inpatient and outpatient)     °[address]           °[phone]   ° °[facility] °[phone] (Suboxone and Methadone) ° °[address]      °[place], [place] [address]      °[phone]      ° °[address] °[place], [place] °852-3033 ° °[facility] (Outpatient/Inpatient, Chemical)    °(insurance only) [phone]      °       °[facility] (Groups & Residential) °[place], [place] °[phone] ° °   °[facility]     °[address]     °[place], [place]      °[phone]      ° °[facility] (for caregivers and family) °[address] °[place], [place] °336-299-4655 ° ° ° ° ° °Residential Treatment Programs °[facility]      °[address], [place], [place] [address]  °[phone]      ° °[facility] °[address] [place], [place] [address] °[phone] ° °[facility]        °336-248-8914      ° °[facility] °[phone] ° °[facility]             °[address]                                         °[place], [place]                                                °[phone] or [phone]                              ° °[facility] [place] °[address] °[place], [address] °[phone] ° °[facility]    °[address]      °[place], [place]     °[phone]      ° °[facility] °[address] °[place], [place] °336-285-9073 ° °[facility]   °[address]     °[place], [place] [address]     °[phone]      °Admissions: 8am-3pm M-F ° °[facility] °[address] °[place],  [place] °336-227-7417 ° °[facility]: Residential Program (90 Days)   °[place], [place]      °336-725-8389 or [phone]    ° °[facility] °[place],  °(Walk in Hours over the weekend or by referral) ° °[facility] °[address], [place],  [address] °([phone] ° °Crisis Mobile: [facility]:  1-877-626-1772 (for crisis response 24 hours a day) °[facility] Hotline:      1-800-256-2452 °Outpatient Psychiatry and Counseling ° °[facility]: Mobile Crisis   Management 24 hours:  1-877-626-1772 ° °[facility] sliding scale fee and walk in schedule: M-F 8am-12pm/1pm-3pm °[address]  °[place], [place] [address] °[phone] ° °[facility] °[address] °[place], [place] [address] °336-703-9650 ° °[facility] ([facility])- new patient walk-in appointments available Monday - Friday 8am -3pm.          °[address] °[place], [place] [address] °336-676-6840 or crisis line- 336-676-6905 ° °[facility] Intensive Outpatient Therapy Program °[address] °[place], [place] [address] °336-832-9804 ° °[facility]                  °Crisis Services      °[phone]      °[address]     °[place], [place] [address]                ° °[facility]   °[facility] °[phone] °[address] °[place], [place] [address] ° ° °[facility]          °[address]  °[place], [place] [address]       °[phone] ° °[facility] °[address] °[place], [place] [address] °336-292-1510 ° °[facility]    °[address]    °[place], [place] [address]     °[phone]      ° °Parish McKinney, MD     °[address]     °[place] [address]     °336-282-1251     °  °[facility] °[address] °[place] [address] ° °[facility]     °[address]     °[place], [place]      °336-542-2076      ° °[facility] °Shamsher Ahluwalia, MD °[address] °[place], [place] [address] °336-420-9558 ° °[facility]     °[address]     °[place], [place] [address]     °336-274-1237      ° °[facility] °[address] °[place], [place] [address] °336-854-4450 °Resources for Temporary Residential Assistance/Crisis Centers ° °DAY CENTERS °[facility] °M-F 8am-3pm   °[address] [place], [place] [address]   336-332-0824 °Services include: laundry, barbering, support groups, case management, phone  & computer access, showers, AA/NA mtgs, mental health/substance abuse nurse, job skills class, disability information, VA assistance, spiritual classes, etc.  ° °HOMELESS SHELTERS ° °[facility]     °[facility]   °[address], [place]     °[phone]       °       °[facility] (women and children)       °[address] °[place], [place] [address] °336-275-0820 °[email or web address] for application and process °Application Required ° °[facility]   °[address]    °[place] [address]     °[phone]       °             °[facility] °[address] °[place], [place] [address] °336.273.5572 °336-235-0363(schedule application appt.) °Application Required ° °[facility] (women only)    °[address]     °[place],  [address]     °336-884-1039      °  Intake starts 6pm daily °Need valid ID, SSC, & Police report °[facility] [place] °[address] °[place], [place] °336-881-5420 °Application Required ° °[facility] (men only)     °[address]      °[place], [place]     °[phone]      ° °[facility] °(Pregnant women only) °[address] °[place], [place] °336-275-0206 ° °[facility]      °[address]      °[place], [place] [address]     °336-722-9951      °       °[facility] [facility] °[address] °[place], [place] °336-723-1848 °90 day commitment/SA/Application process ° °[facility](men only)     °[address]     °[place], [place]     °[phone]       °Check-in at 7pm     °       °[facility] °[address] °[place], [place] [address] °336-248-6684 °Men/Women/Women and Children must be there by 7 pm ° °[facility] °[place],  °336-722-8721                ° °

## 2020-04-25 NOTE — Progress Notes (Signed)
TOC CSW was able to connect pt to [facility].  CSW attempted to contact [employer] and [employer].  CSW left HIPPA compliant message with my contact information at [facility].  CSW was unable to find a working number for [employer].  Pt may request to go to [facility] following her visit at [facility].  CSW will continue to follow for dc needs.  Vauda Salvucci Tarpley-Carter, MSW, LCSW-A Pronouns:  She, Her, Hers                  [facility] Transitions of CareClinical Social Worker [email or web address] [phone]

## 2020-04-25 NOTE — ED Notes (Signed)
Pt DCd off unit to [facility] per provider and SW set up. Pt alert, calm, cooperative, no s/s of distress. DC information and belongings given to pt. Pt ambulatory with w/c. Pt off unit in w/c, escorted by RN. Pt transported by [facility] transportation set up by SW.

## 2020-04-25 NOTE — Progress Notes (Signed)
TOC CSW will assist pt with transportation.  [employer] Waiver has been signed by pt and faxed.  Transportation has been called.   Driver:  Information systems manager:  [employer]  Tag:  [phone]  CSW will continue to follow for dc needs.  Katryn Plummer Tarpley-Carter, MSW, LCSW-A Pronouns:  She, Her, Hers                  [facility] Transitions of CareClinical Social Worker [email or web address] [phone]

## 2020-04-25 NOTE — ED Notes (Signed)
Patient used walker and alerted staff when she needed something. Calm and cooperative.

## 2020-05-14 ENCOUNTER — Other Ambulatory Visit: Payer: Self-pay

## 2020-05-14 ENCOUNTER — Ambulatory Visit (HOSPITAL_COMMUNITY)
Admission: EM | Admit: 2020-05-14 | Discharge: 2020-05-15 | Disposition: A | Discharge status: Other Acute Inpatient Hospital | Payer: No Payment, Other | Attending: Urology | Admitting: Urology

## 2020-05-14 DIAGNOSIS — R45851 Suicidal ideations: Secondary | ICD-10-CM | POA: Diagnosis not present

## 2020-05-14 DIAGNOSIS — F1910 Other psychoactive substance abuse, uncomplicated: Secondary | ICD-10-CM | POA: Diagnosis not present

## 2020-05-14 DIAGNOSIS — F333 Major depressive disorder, recurrent, severe with psychotic symptoms: Secondary | ICD-10-CM | POA: Diagnosis not present
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
## 2020-05-15 ENCOUNTER — Other Ambulatory Visit: Payer: Self-pay

## 2020-05-15 ENCOUNTER — Encounter (HOSPITAL_COMMUNITY): Payer: Self-pay | Admitting: Student

## 2020-05-15 ENCOUNTER — Emergency Department (HOSPITAL_COMMUNITY)
Admission: EM | Admit: 2020-05-15 | Discharge: 2020-05-16 | Disposition: A | Discharge status: Other Acute Inpatient Hospital | Payer: Self-pay | Attending: Emergency Medicine | Admitting: Emergency Medicine

## 2020-05-15 DIAGNOSIS — F17210 Nicotine dependence, cigarettes, uncomplicated: Secondary | ICD-10-CM | POA: Insufficient documentation

## 2020-05-15 DIAGNOSIS — R45851 Suicidal ideations: Secondary | ICD-10-CM | POA: Insufficient documentation

## 2020-05-15 DIAGNOSIS — I10 Essential (primary) hypertension: Secondary | ICD-10-CM | POA: Insufficient documentation

## 2020-05-15 DIAGNOSIS — M545 Low back pain, unspecified: Secondary | ICD-10-CM | POA: Insufficient documentation

## 2020-05-15 DIAGNOSIS — S60812A Abrasion of left wrist, initial encounter: Secondary | ICD-10-CM | POA: Insufficient documentation

## 2020-05-15 DIAGNOSIS — J45909 Unspecified asthma, uncomplicated: Secondary | ICD-10-CM | POA: Insufficient documentation

## 2020-05-15 DIAGNOSIS — F323 Major depressive disorder, single episode, severe with psychotic features: Secondary | ICD-10-CM | POA: Diagnosis present

## 2020-05-15 DIAGNOSIS — F333 Major depressive disorder, recurrent, severe with psychotic symptoms: Secondary | ICD-10-CM | POA: Insufficient documentation

## 2020-05-15 DIAGNOSIS — Z20822 Contact with and (suspected) exposure to covid-19: Secondary | ICD-10-CM | POA: Insufficient documentation

## 2020-05-15 DIAGNOSIS — X58XXXA Exposure to other specified factors, initial encounter: Secondary | ICD-10-CM | POA: Insufficient documentation

## 2020-05-15 DIAGNOSIS — R45850 Homicidal ideations: Secondary | ICD-10-CM | POA: Insufficient documentation

## 2020-05-15 DIAGNOSIS — Z79899 Other long term (current) drug therapy: Secondary | ICD-10-CM | POA: Insufficient documentation

## 2020-05-15 DIAGNOSIS — F331 Major depressive disorder, recurrent, moderate: Secondary | ICD-10-CM | POA: Diagnosis present

## 2020-05-15 DIAGNOSIS — G8929 Other chronic pain: Secondary | ICD-10-CM | POA: Insufficient documentation

## 2020-05-15 LAB — COMPREHENSIVE METABOLIC PANEL
ALT: 10 U/L (ref 0–44)
AST: 16 U/L (ref 15–41)
Albumin: 3.7 g/dL (ref 3.5–5.0)
Alkaline Phosphatase: 73 U/L (ref 38–126)
Anion gap: 7 (ref 5–15)
BUN: 12 mg/dL (ref 6–20)
CO2: 28 mmol/L (ref 22–32)
Calcium: 9.1 mg/dL (ref 8.9–10.3)
Chloride: 101 mmol/L (ref 98–111)
Creatinine, Ser: 0.89 mg/dL (ref 0.44–1.00)
GFR, Estimated: >60 mL/min (ref >60)
Glucose, Bld: 91 mg/dL (ref 70–99)
Potassium: 4.3 mmol/L (ref 3.5–5.1)
Sodium: 136 mmol/L (ref 135–145)
Total Bilirubin: 0.5 mg/dL (ref 0.3–1.2)
Total Protein: 7.1 g/dL (ref 6.5–8.1)

## 2020-05-15 LAB — CBC
HCT: 41.9 % (ref 36.0–46.0)
Hemoglobin: 14 g/dL (ref 12.0–15.0)
MCH: 31.3 pg (ref 26.0–34.0)
MCHC: 33.4 g/dL (ref 30.0–36.0)
MCV: 93.7 fL (ref 80.0–100.0)
Platelets: 347 10*3/uL (ref 150–400)
RBC: 4.47 MIL/uL (ref 3.87–5.11)
RDW: 15.1 % (ref 11.5–15.5)
WBC: 5.6 10*3/uL (ref 4.0–10.5)
nRBC: 0 % (ref 0.0–0.2)

## 2020-05-15 LAB — ACETAMINOPHEN LEVEL: Acetaminophen (Tylenol), Serum: <10 ug/mL — ABNORMAL LOW (ref 10–30)

## 2020-05-15 LAB — RESP PANEL BY RT-PCR (FLU A&B, COVID) ARPGX2
Influenza A by PCR: NEGATIVE
Influenza B by PCR: NEGATIVE
SARS Coronavirus 2 by RT PCR: NEGATIVE

## 2020-05-15 LAB — ETHANOL: Alcohol, Ethyl (B): <10 mg/dL (ref <10)

## 2020-05-15 LAB — SALICYLATE LEVEL: Salicylate Lvl: <7 mg/dL — ABNORMAL LOW (ref 7.0–30.0)

## 2020-05-15 LAB — I-STAT BETA HCG BLOOD, ED (MC, WL, AP ONLY): I-stat hCG, quantitative: <5 m[IU]/mL (ref <5)

## 2020-05-15 MED ORDER — ALUM & MAG HYDROXIDE-SIMETH 200-200-20 MG/5ML PO SUSP: 30.0000 mL | Freq: Four times a day (QID) | ORAL | Status: DC | PRN

## 2020-05-15 MED ORDER — QUETIAPINE FUMARATE 400 MG PO TABS
400.0000 mg | ORAL_TABLET | Freq: Every day | ORAL | Status: DC
  Administered 2020-05-16: 400 mg via ORAL
  Filled 2020-05-15: qty 1

## 2020-05-15 MED ORDER — ACETAMINOPHEN 325 MG PO TABS: 650.0000 mg | ORAL_TABLET | ORAL | Status: DC | PRN

## 2020-05-15 MED ORDER — FLUOXETINE HCL 20 MG PO CAPS
40.0000 mg | ORAL_CAPSULE | Freq: Every day | ORAL | Status: DC
  Administered 2020-05-15: 40 mg via ORAL
  Filled 2020-05-15: qty 2

## 2020-05-15 MED ORDER — AMLODIPINE BESYLATE 5 MG PO TABS
10.0000 mg | ORAL_TABLET | Freq: Every day | ORAL | Status: DC
  Administered 2020-05-15: 10 mg via ORAL
  Filled 2020-05-15: qty 2

## 2020-05-15 MED ORDER — PRAZOSIN HCL 1 MG PO CAPS
3.0000 mg | ORAL_CAPSULE | Freq: Every day | ORAL | Status: DC
  Administered 2020-05-16: 3 mg via ORAL
  Filled 2020-05-15: qty 1

## 2020-05-15 MED ORDER — NICOTINE 21 MG/24HR TD PT24
21.0000 mg | MEDICATED_PATCH | Freq: Every day | TRANSDERMAL | Status: DC
  Administered 2020-05-15: 21 mg via TRANSDERMAL
  Filled 2020-05-15: qty 1

## 2020-05-15 MED ORDER — TRAZODONE HCL 50 MG PO TABS
150.0000 mg | ORAL_TABLET | Freq: Every day | ORAL | Status: DC
  Administered 2020-05-16: 150 mg via ORAL
  Filled 2020-05-15: qty 3

## 2020-05-15 MED ORDER — GABAPENTIN 300 MG PO CAPS
300.0000 mg | ORAL_CAPSULE | Freq: Three times a day (TID) | ORAL | Status: DC
  Administered 2020-05-15 – 2020-05-16 (×2): 300 mg via ORAL
  Filled 2020-05-15 (×2): qty 1
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
## 2020-05-15 NOTE — ED Notes (Signed)
Pt sitting up on stretcher eating lunch

## 2020-05-15 NOTE — ED Notes (Signed)
Safe transport called 

## 2020-05-15 NOTE — Care Management (Signed)
Writer informed RN of the patient disposition.    Patient is to be admitted to [facility] tonight after 7:30pm by Dr. Neale Burly.  Attending Physician will be Dr. Neale Burly.   Patient has been assigned to room 323, by [facility] Charge Nurse, Aundra Millet.

## 2020-05-15 NOTE — Care Management (Signed)
  [facility] is still reviewing the patient for possible placement.

## 2020-05-15 NOTE — ED Notes (Signed)
Pt provided with [place] sandwich bag and ginger ale per RN order.

## 2020-05-15 NOTE — BH Assessment (Signed)
Patient is to be admitted to [facility] tonight after 7:30pm by Dr. Neale Burly.  Attending Physician will be Dr. Neale Burly.   Patient has been assigned to room 323, by [facility] Charge Nurse, Aundra Millet.      Ethelene Browns, Patient Access.

## 2020-05-15 NOTE — ED Provider Notes (Signed)
  Emergency Medicine Provider in Triage Note   MSE was initiated and I personally evaluated the patient  1:24 AM on May 15, 2020 as provider in triage.   Chief Complaint: SI  HPI  Patient is a 50 y.o. who presets to the ED with complaints of SI. Cut her left wrist yesterday. Had knife to herself earlier tonight but did not cut herself. Hearing voices. Sometimes has HI does not elaborate. .   Review of Systems  Positive: SI, HI, hallucinations Negative: Chest pain, dyspnea, or abdominal pain  Physical Exam  BP (!) 135/94 (BP Location: Left Arm)   Pulse 90   Temp 98.3 F (36.8 C) (Oral)   Resp 18   LMP 06/19/2018 (Exact Date)   SpO2 100%    Gen:   Awake, no distress   HEENT:  Atraumatic  Resp:  Normal effort  Cardiac:  Normal rate  Abd:   Nondistended, nontender  MSK:   Moves extremities without difficulty small abrasion to left ventral wrist. No active bleeding. No signs of infection.  Neuro:  Speech clear   Medical Decision Making   Initiation of care has begun. The patient has been counseled on the process, plan, and necessity for staying for the completion/evaluation, informed that the remainder of the evaluation will be completed by another provider, this initial triage assessment does not replace that evaluation, and the importance of remaining in the ED until their evaluation is complete.  Sitter ordered. Screening labs.  Patient voluntary, but if tries to leave would IVC at this time.    Clinical Impression  SI      Cherly Anderson, PA-C 05/15/20 0126    Shon Baton, MD 05/15/20 [phone]

## 2020-05-15 NOTE — ED Notes (Addendum)
Pt state she walks with her walker everyday due to her being a high fall risk. Pt family member eft walker at home. Pt say she needs her walker everyday for balance

## 2020-05-16 ENCOUNTER — Inpatient Hospital Stay
Admission: RE | Admit: 2020-05-16 | Discharge: 2020-06-02 | DRG: 885 | Disposition: A | Discharge status: Home / Self Care | Payer: 59 | Source: Intra-hospital | Attending: Behavioral Health | Admitting: Behavioral Health

## 2020-05-16 ENCOUNTER — Other Ambulatory Visit: Payer: Self-pay

## 2020-05-16 ENCOUNTER — Encounter: Payer: Self-pay | Admitting: Behavioral Health

## 2020-05-16 DIAGNOSIS — F331 Major depressive disorder, recurrent, moderate: Secondary | ICD-10-CM | POA: Diagnosis present

## 2020-05-16 DIAGNOSIS — Z20822 Contact with and (suspected) exposure to covid-19: Secondary | ICD-10-CM | POA: Diagnosis present

## 2020-05-16 DIAGNOSIS — F4312 Post-traumatic stress disorder, chronic: Secondary | ICD-10-CM | POA: Diagnosis present

## 2020-05-16 DIAGNOSIS — R45851 Suicidal ideations: Secondary | ICD-10-CM | POA: Diagnosis present

## 2020-05-16 DIAGNOSIS — K0889 Other specified disorders of teeth and supporting structures: Secondary | ICD-10-CM | POA: Diagnosis not present

## 2020-05-16 DIAGNOSIS — F4310 Post-traumatic stress disorder, unspecified: Secondary | ICD-10-CM | POA: Diagnosis present

## 2020-05-16 DIAGNOSIS — F1410 Cocaine abuse, uncomplicated: Secondary | ICD-10-CM | POA: Diagnosis present

## 2020-05-16 DIAGNOSIS — G4700 Insomnia, unspecified: Secondary | ICD-10-CM | POA: Diagnosis present

## 2020-05-16 DIAGNOSIS — I10 Essential (primary) hypertension: Secondary | ICD-10-CM | POA: Diagnosis present

## 2020-05-16 DIAGNOSIS — Z91410 Personal history of adult physical and sexual abuse: Secondary | ICD-10-CM

## 2020-05-16 DIAGNOSIS — Z5900 Homelessness unspecified: Secondary | ICD-10-CM | POA: Diagnosis not present

## 2020-05-16 DIAGNOSIS — Z8249 Family history of ischemic heart disease and other diseases of the circulatory system: Secondary | ICD-10-CM | POA: Diagnosis not present

## 2020-05-16 DIAGNOSIS — Z79899 Other long term (current) drug therapy: Secondary | ICD-10-CM

## 2020-05-16 DIAGNOSIS — F1020 Alcohol dependence, uncomplicated: Secondary | ICD-10-CM | POA: Diagnosis present

## 2020-05-16 DIAGNOSIS — F333 Major depressive disorder, recurrent, severe with psychotic symptoms: Secondary | ICD-10-CM | POA: Diagnosis present

## 2020-05-16 DIAGNOSIS — Z9151 Personal history of suicidal behavior: Secondary | ICD-10-CM

## 2020-05-16 DIAGNOSIS — F17210 Nicotine dependence, cigarettes, uncomplicated: Secondary | ICD-10-CM | POA: Diagnosis present

## 2020-05-16 DIAGNOSIS — F1120 Opioid dependence, uncomplicated: Secondary | ICD-10-CM | POA: Diagnosis present

## 2020-05-16 DIAGNOSIS — M792 Neuralgia and neuritis, unspecified: Secondary | ICD-10-CM | POA: Diagnosis not present

## 2020-05-16 DIAGNOSIS — F1994 Other psychoactive substance use, unspecified with psychoactive substance-induced mood disorder: Secondary | ICD-10-CM | POA: Diagnosis present

## 2020-05-16 DIAGNOSIS — M419 Scoliosis, unspecified: Secondary | ICD-10-CM | POA: Diagnosis present

## 2020-05-16 DIAGNOSIS — J45909 Unspecified asthma, uncomplicated: Secondary | ICD-10-CM | POA: Diagnosis present

## 2020-05-16 DIAGNOSIS — F323 Major depressive disorder, single episode, severe with psychotic features: Secondary | ICD-10-CM | POA: Diagnosis present

## 2020-05-16 LAB — HIV ANTIBODY (ROUTINE TESTING W REFLEX): HIV Screen 4th Generation wRfx: NONREACTIVE

## 2020-05-16 MED ORDER — ALUM & MAG HYDROXIDE-SIMETH 200-200-20 MG/5ML PO SUSP: 30.0000 mL | ORAL | Status: DC | PRN

## 2020-05-16 MED ORDER — AMLODIPINE BESYLATE 5 MG PO TABS
10.0000 mg | ORAL_TABLET | Freq: Every day | ORAL | Status: DC
  Administered 2020-05-16 – 2020-06-02 (×18): 10 mg via ORAL
  Filled 2020-05-16 (×18): qty 2

## 2020-05-16 MED ORDER — ONDANSETRON HCL 4 MG PO TABS: 4.0000 mg | ORAL_TABLET | Freq: Three times a day (TID) | ORAL | Status: DC | PRN

## 2020-05-16 MED ORDER — FLUOXETINE HCL 20 MG PO CAPS
40.0000 mg | ORAL_CAPSULE | Freq: Every day | ORAL | Status: DC
  Administered 2020-05-16 – 2020-06-02 (×18): 40 mg via ORAL
  Filled 2020-05-16 (×18): qty 2

## 2020-05-16 MED ORDER — METHOCARBAMOL 500 MG PO TABS
1000.0000 mg | ORAL_TABLET | Freq: Four times a day (QID) | ORAL | Status: DC | PRN
  Administered 2020-05-18 – 2020-06-01 (×5): 1000 mg via ORAL
  Filled 2020-05-16 (×6): qty 2

## 2020-05-16 MED ORDER — DICYCLOMINE HCL 20 MG PO TABS: 20.0000 mg | ORAL_TABLET | Freq: Three times a day (TID) | ORAL | Status: DC | PRN

## 2020-05-16 MED ORDER — LOPERAMIDE HCL 2 MG PO CAPS: 2.0000 mg | ORAL_CAPSULE | ORAL | Status: DC | PRN

## 2020-05-16 MED ORDER — PRAZOSIN HCL 2 MG PO CAPS
3.0000 mg | ORAL_CAPSULE | Freq: Every day | ORAL | Status: DC
  Administered 2020-05-17 – 2020-05-25 (×9): 3 mg via ORAL
  Filled 2020-05-16 (×11): qty 1

## 2020-05-16 MED ORDER — GABAPENTIN 300 MG PO CAPS
300.0000 mg | ORAL_CAPSULE | Freq: Three times a day (TID) | ORAL | Status: DC
  Administered 2020-05-16 – 2020-06-02 (×45): 300 mg via ORAL
  Filled 2020-05-16 (×45): qty 1

## 2020-05-16 MED ORDER — TRAZODONE HCL 100 MG PO TABS
150.0000 mg | ORAL_TABLET | Freq: Every day | ORAL | Status: DC
Start: 2020-05-16 — End: 2020-06-02
  Administered 2020-05-16 – 2020-06-01 (×17): 150 mg via ORAL
  Filled 2020-05-16 (×18): qty 1

## 2020-05-16 MED ORDER — ACETAMINOPHEN 325 MG PO TABS
650.0000 mg | ORAL_TABLET | Freq: Four times a day (QID) | ORAL | Status: DC | PRN
  Administered 2020-05-30 – 2020-05-31 (×2): 650 mg via ORAL
  Filled 2020-05-16 (×2): qty 2

## 2020-05-16 MED ORDER — QUETIAPINE FUMARATE 200 MG PO TABS
400.0000 mg | ORAL_TABLET | Freq: Every day | ORAL | Status: DC
  Administered 2020-05-16 – 2020-06-01 (×17): 400 mg via ORAL
  Filled 2020-05-16 (×18): qty 2

## 2020-05-16 MED ORDER — MAGNESIUM HYDROXIDE 400 MG/5ML PO SUSP: 30.0000 mL | Freq: Every day | ORAL | Status: DC | PRN

## 2020-05-16 MED ORDER — NICOTINE 21 MG/24HR TD PT24
21.0000 mg | MEDICATED_PATCH | Freq: Every day | TRANSDERMAL | Status: DC
  Administered 2020-05-16 – 2020-06-02 (×11): 21 mg via TRANSDERMAL
  Filled 2020-05-16 (×15): qty 1
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
## 2020-05-16 NOTE — ED Notes (Signed)
Fayrene Fearing with transport is going to call RN phone back
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
## 2020-05-16 NOTE — Tx Team (Signed)
Interdisciplinary Treatment and Diagnostic Plan Update  05/16/2020 Time of Session: 0900 Rachel Nolan MRN: [id number]  Principal Diagnosis: <principal problem not specified>  Secondary Diagnoses: Active Problems:   MDD (major depressive disorder), recurrent, severe, with psychosis ([place])   Current Medications:  Current Facility-Administered Medications  Medication Dose Route Frequency Provider Last Rate Last Admin  . acetaminophen (TYLENOL) tablet 650 mg  650 mg Oral Q6H PRN Salley Scarlet, MD      . alum & mag hydroxide-simeth (MAALOX/MYLANTA) 200-200-20 MG/5ML suspension 30 mL  30 mL Oral Q4H PRN Salley Scarlet, MD      . amLODipine (NORVASC) tablet 10 mg  10 mg Oral Daily Salley Scarlet, MD   10 mg at 05/16/20 0943  . FLUoxetine (PROZAC) capsule 40 mg  40 mg Oral Daily Salley Scarlet, MD   40 mg at 05/16/20 0943  . gabapentin (NEURONTIN) capsule 300 mg  300 mg Oral TID Salley Scarlet, MD      . magnesium hydroxide (MILK OF MAGNESIA) suspension 30 mL  30 mL Oral Daily PRN Salley Scarlet, MD      . nicotine (NICODERM CQ - dosed in mg/24 hours) patch 21 mg  21 mg Transdermal Daily Salley Scarlet, MD   21 mg at 05/16/20 0943  . prazosin (MINIPRESS) capsule 3 mg  3 mg Oral QHS Salley Scarlet, MD      . QUEtiapine (SEROQUEL) tablet 400 mg  400 mg Oral QHS Salley Scarlet, MD      . traZODone (DESYREL) tablet 150 mg  150 mg Oral QHS Salley Scarlet, MD       PTA Medications: Medications Prior to Admission  Medication Sig Dispense Refill Last Dose  . amLODipine (NORVASC) 10 MG tablet Take 1 tablet (10 mg total) by mouth daily. 30 tablet 1   . amLODipine (NORVASC) 10 MG tablet TAKE 1 TABLET (10 MG TOTAL) BY MOUTH DAILY. 7 tablet 0   . FLUoxetine (PROZAC) 20 MG capsule TAKE 3 CAPSULES (60 MG TOTAL) BY MOUTH DAILY. 21 capsule 0   . FLUoxetine (PROZAC) 40 MG capsule Take 40 mg by mouth daily.     Marland Kitchen gabapentin (NEURONTIN) 300 MG capsule Take 2 capsules (600 mg total) by  mouth 3 (three) times daily. (Patient taking differently: Take 300 mg by mouth 3 (three) times daily.) 180 capsule 1   . gabapentin (NEURONTIN) 300 MG capsule TAKE 2 CAPSULES (600 MG) BY MOUTH 3 TIMES DAILY. 42 capsule 0   . prazosin (MINIPRESS) 1 MG capsule Take 3 capsules (3 mg total) by mouth at bedtime. 90 capsule 1   . prazosin (MINIPRESS) 1 MG capsule TAKE 3 CAPSULES (3 MG) BY MOUTH AT BEDTIME. 21 capsule 0   . QUEtiapine (SEROQUEL) 200 MG tablet TAKE 2 TABLETS (400 MG) BY MOUTH AT BEDTIME. 14 tablet 0   . QUEtiapine (SEROQUEL) 400 MG tablet Take 1 tablet (400 mg total) by mouth at bedtime. 30 tablet 1   . traZODone (DESYREL) 150 MG tablet Take 1 tablet (150 mg total) by mouth at bedtime. 30 tablet 1   . traZODone (DESYREL) 150 MG tablet TAKE 1 TABLET BY MOUTH AT BEDTIME. 7 tablet 0     Patient Stressors:    Patient Strengths:    Treatment Modalities: Medication Management, Group therapy, Case management,  1 to 1 session with clinician, Psychoeducation, Recreational therapy.   Physician Treatment Plan for Primary Diagnosis: <principal problem not specified> Long Term Goal(s):  Short Term Goals:    Medication Management: Evaluate patient's response, side effects, and tolerance of medication regimen.  Therapeutic Interventions: 1 to 1 sessions, Unit Group sessions and Medication administration.  Evaluation of Outcomes: Not Met  Physician Treatment Plan for Secondary Diagnosis: Active Problems:   MDD (major depressive disorder), recurrent, severe, with psychosis ([place])  Long Term Goal(s):     Short Term Goals:       Medication Management: Evaluate patient's response, side effects, and tolerance of medication regimen.  Therapeutic Interventions: 1 to 1 sessions, Unit Group sessions and Medication administration.  Evaluation of Outcomes: Not Met   RN Treatment Plan for Primary Diagnosis: <principal problem not specified> Long Term Goal(s): Knowledge of disease and  therapeutic regimen to maintain health will improve  Short Term Goals: Ability to remain free from injury will improve, Ability to verbalize frustration and anger appropriately will improve, Ability to demonstrate self-control, Ability to participate in decision making will improve, Ability to verbalize feelings will improve, Ability to disclose and discuss suicidal ideas, Ability to identify and develop effective coping behaviors will improve and Compliance with prescribed medications will improve  Medication Management: RN will administer medications as ordered by provider, will assess and evaluate patient's response and provide education to patient for prescribed medication. RN will report any adverse and/or side effects to prescribing provider.  Therapeutic Interventions: 1 on 1 counseling sessions, Psychoeducation, Medication administration, Evaluate responses to treatment, Monitor vital signs and CBGs as ordered, Perform/monitor CIWA, COWS, AIMS and Fall Risk screenings as ordered, Perform wound care treatments as ordered.  Evaluation of Outcomes: Not Met   LCSW Treatment Plan for Primary Diagnosis: <principal problem not specified> Long Term Goal(s): Safe transition to appropriate next level of care at discharge, Engage patient in therapeutic group addressing interpersonal concerns.  Short Term Goals: Engage patient in aftercare planning with referrals and resources, Increase social support, Increase ability to appropriately verbalize feelings, Increase emotional regulation, Facilitate acceptance of mental health diagnosis and concerns, Facilitate patient progression through stages of change regarding substance use diagnoses and concerns, Identify triggers associated with mental health/substance abuse issues and Increase skills for wellness and recovery  Therapeutic Interventions: Assess for all discharge needs, 1 to 1 time with Social worker, Explore available resources and support systems,  Assess for adequacy in community support network, Educate family and significant other(s) on suicide prevention, Complete Psychosocial Assessment, Interpersonal group therapy.  Evaluation of Outcomes: Not Met   Progress in Treatment: Attending groups: No. Participating in groups: No. Taking medication as prescribed: Yes. Toleration medication: Yes. Family/Significant other contact made: No, will contact:  CSW to obtain consent to contact collateral.  Patient understands diagnosis: Yes. Discussing patient identified problems/goals with staff: Yes. Medical problems stabilized or resolved: Yes. Denies suicidal/homicidal ideation: Yes. Issues/concerns per patient self-inventory: Yes. Other: none    New problem(s) identified: No, Describe:  No additional problems identified at treatment meeting.  New Short Term/Long Term Goal(s): detox, elimination of symptoms of psychosis, medication management for mood stabilization; elimination of SI thoughts; development of comprehensive mental wellness/sobriety plan.  Patient Goals:  "try to find a treatment place and some where for me and my son to stay at"   Discharge Plan or Barriers: None identified at this time.   Reason for Continuation of Hospitalization: Anxiety Depression Medication stabilization Suicidal ideation Withdrawal symptoms  Estimated Length of Stay: 2-7 days    Attendees: Patient: Rachel Nolan 05/16/2020 9:44 AM  Physician: Selina Cooley, MD 05/16/2020 9:44 AM  Nursing: Caryl Asp  Raiford Noble, RN  05/16/2020 9:44 AM  RN Care Manager: 05/16/2020 9:44 AM  Social Worker: Paulla Dolly, MSW, [place], [place]  05/16/2020 9:44 AM  Recreational Therapist: Devin Going, LRT  05/16/2020 9:44 AM  Other: Kiva Martinique, [place] 05/16/2020 9:44 AM  Other: Michell Heinrich, MSW, [place], [place]  05/16/2020 9:44 AM  Other: 05/16/2020 9:44 AM    Scribe for Treatment Team: Durenda Hurt, [place] 05/16/2020 9:44 AM

## 2020-05-16 NOTE — Progress Notes (Signed)
Recreation Therapy Notes    Date: 05/16/2020  Time: 10:00 am   Location: Craft room     Behavioral response: N/A   Intervention Topic: Relaxation   Discussion/Intervention: Patient did not attend group.   Clinical Observations/Feedback:  Patient did not attend group.   Salvatore Shear LRT/CTRS        Trevontae Lindahl 05/16/2020 12:56 PM

## 2020-05-16 NOTE — ED Notes (Signed)
Pt requesting meds to help her sleep. Pt given night time meds - see previous notes to see why meds were held

## 2020-05-16 NOTE — Tx Team (Signed)
Initial Treatment Plan 05/16/2020 11:08 AM [name] UVO:[id number]   PATIENT STRESSORS: Substance abuse   PATIENT STRENGTHS: Communication skills   PATIENT IDENTIFIED PROBLEMS: Substance abuse  Depression                   DISCHARGE CRITERIA:  Improved stabilization in mood, thinking, and/or behavior  PRELIMINARY DISCHARGE PLAN: Placement in alternative living arrangements  PATIENT/FAMILY INVOLVEMENT: This treatment plan has been presented to and reviewed with the patient, Rachel Nolan.The patient has been given the opportunity to ask questions and make suggestions.  Celene Kras, RN 05/16/2020, 11:08 AM

## 2020-05-16 NOTE — ED Notes (Signed)
Pattricia Boss, Consulting civil engineer, spoke with [facility] about registration issue. [facility] AC is going to speak with [facility] to get situation figured out.

## 2020-05-16 NOTE — BHH Counselor (Signed)
CSW attempted to complete comprehensive assessment with patient but patient was unable to complete assessment because pt was feeling unwell.   Mayla Biddy [place], MSW, LCSW-A 4/29/202211:39 AM

## 2020-05-16 NOTE — ED Notes (Signed)
Transport called for pt.

## 2020-05-16 NOTE — ED Notes (Signed)
This RN called safe transport and left message to have them transport patient

## 2020-05-16 NOTE — BHH Group Notes (Signed)
LCSW Group Therapy Note  05/16/2020 3:04 PM  Type of Therapy and Topic:  Group Therapy:  Feelings around Relapse and Recovery  Participation Level:  Did Not Attend   Description of Group:    Patients in this group will discuss emotions they experience before and after a relapse. They will process how experiencing these feelings, or avoidance of experiencing them, relates to having a relapse. Facilitator will guide patients to explore emotions they have related to recovery. Patients will be encouraged to process which emotions are more powerful. They will be guided to discuss the emotional reaction significant others in their lives may have to their relapse or recovery. Patients will be assisted in exploring ways to respond to the emotions of others without this contributing to a relapse.  Therapeutic Goals: 1. Patient will identify two or more emotions that lead to a relapse for them 2. Patient will identify two emotions that result when they relapse 3. Patient will identify two emotions related to recovery 4. Patient will demonstrate ability to communicate their needs through discussion and/or role plays   Summary of Patient Progress: Patient did not attend group despite encouraged participation.    Therapeutic Modalities:   Cognitive Behavioral Therapy Solution-Focused Therapy Assertiveness Training Relapse Prevention Therapy   Gwenevere Ghazi, MSW, [place], [place] 05/16/2020 3:04 PM

## 2020-05-16 NOTE — ED Notes (Signed)
This RN tried to contact safe transport again - no answer.

## 2020-05-16 NOTE — ED Notes (Signed)
This RN spoke with Suszanne Conners at [facility] - informed this RN that we are still waiting on pt to be put in to system. Britta Mccreedy RN has this RN phone number to call back when they are ready. Britta Mccreedy RN asked this RN to hold meds until pt is going to be taken to [facility].

## 2020-05-16 NOTE — ED Provider Notes (Signed)
Patient accepted to [facility]. EMTALA filled out. Patient reassessed, resting comfortably in stretcher during ED visit.    Pollyann Savoy, MD 05/16/20 [phone]
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
## 2020-05-16 NOTE — ED Provider Notes (Signed)
Patient resting, easily aroused. Vitals normal. No distress.   Accepted in transfer to [facility]. Pt appears stable for transfer.      Cathren Laine, MD 05/16/20 [phone]

## 2020-05-17 DIAGNOSIS — F333 Major depressive disorder, recurrent, severe with psychotic symptoms: Secondary | ICD-10-CM | POA: Diagnosis not present

## 2020-05-17 LAB — LIPID PANEL
Cholesterol: 150 mg/dL (ref 0–200)
HDL: 58 mg/dL (ref >40)
LDL Cholesterol: 64 mg/dL (ref 0–99)
Total CHOL/HDL Ratio: 2.6 RATIO
Triglycerides: 141 mg/dL (ref <150)
VLDL: 28 mg/dL (ref 0–40)

## 2020-05-17 LAB — HEPATITIS PANEL, ACUTE
HCV Ab: NONREACTIVE
Hep A IgM: NONREACTIVE
Hep B C IgM: NONREACTIVE
Hepatitis B Surface Ag: NONREACTIVE

## 2020-05-17 LAB — RPR: RPR Ser Ql: NONREACTIVE

## 2020-05-17 NOTE — BHH Group Notes (Signed)
BHH Group Notes: (Clinical Social Work)   05/17/2020      Type of Therapy:  Group Therapy   Participation Level:  Did Not Attend despite MHT prompting   Shellia Cleverly, LCSW  05/17/2020 2:13 PM

## 2020-05-17 NOTE — Progress Notes (Signed)
[facility]  05/17/2020 12:30 PM [name]  MRN:  [id number]  Principal Problem: MDD (major depressive disorder), recurrent, severe, with psychosis ([facility]) Diagnosis: Principal Problem:   MDD (major depressive disorder), recurrent, severe, with psychosis ([facility]) Active Problems:   Post traumatic stress disorder (PTSD)   Cocaine use disorder ([facility])   Substance induced mood disorder ([facility])   Alcohol use disorder, severe, dependence (H[facility])   Opioid use disorder, moderate, dependence (H[facility])  [name] is a 50y.o. female with major depressive disorder, PTSD, and substance use disorder, presenting for worsening mood, suicidal ideations, and auditory hallucinations in settings on relapsing on drugs (cocaine and heroin).   Interval History Patient was seen today for re-evaluation.  Nursing reports no events overnight. The patient has no issues with performing ADLs.  Patient has been medication compliant.    Subjective:  On assessment patient reports "it is getting better, I guess". She identifies her mood as "so-so" today and reports suicidal thoughts without particular plans. Denies homicidal thoughts. She reports improved sleep, no nightmares. She denies auditory/visual hallucinations today. She reports her interest in residential substance-abuse treatment as she is unable to stay off drugs and off her boyfriend and relapses every time after hospital discharge and because she is not allowed to live with her adult children until she is clean.  Labs: no new results for review.  Total Time spent with patient: 20 minutes  Past Psychiatric History: see H&P  Past Medical History:  Past Medical History:  Diagnosis Date  . Anxiety   . Asthma   . Depression   . Hypertension   . Scoliosis     Past Surgical History:  Procedure Laterality Date  . BACK SURGERY    . ECTOPIC PREGNANCY SURGERY     Family History:  Family History  Problem Relation Age of Onset  . Hypertension Mother    . Hypertension Father    Family Psychiatric  History: see H&P Social History:  Social History   Substance and Sexual Activity  Alcohol Use Yes     Social History   Substance and Sexual Activity  Drug Use Yes  . Types: Marijuana, Cocaine, "Crack" cocaine    Social History   Socioeconomic History  . Marital status: Widowed    Spouse name: Not on file  . Number of children: Not on file  . Years of education: Not on file  . Highest education level: Not on file  Occupational History  . Not on file  Tobacco Use  . Smoking status: Current Every Day Smoker    Packs/day: 1.00    Types: Cigarettes  . Smokeless tobacco: Never Used  Vaping Use  . Vaping Use: Never used  Substance and Sexual Activity  . Alcohol use: Yes  . Drug use: Yes    Types: Marijuana, Cocaine, "Crack" cocaine  . Sexual activity: Not Currently  Other Topics Concern  . Not on file  Social History Narrative   Pt is homeless, no fixed address; not followed by an outpatient psychiatrist   Social Determinants of Health   Financial Resource Strain: Not on file  Food Insecurity: Not on file  Transportation Needs: Not on file  Physical Activity: Not on file  Stress: Not on file  Social Connections: Not on file   Additional Social History:                         Sleep: Fair  Appetite:  Good  Current Medications: Current  Facility-Administered Medications  Medication Dose Route Frequency Provider Last Rate Last Admin  . acetaminophen (TYLENOL) tablet 650 mg  650 mg Oral Q6H PRN Jesse Sans, MD      . alum & mag hydroxide-simeth (MAALOX/MYLANTA) 200-200-20 MG/5ML suspension 30 mL  30 mL Oral Q4H PRN Jesse Sans, MD      . amLODipine (NORVASC) tablet 10 mg  10 mg Oral Daily Jesse Sans, MD   10 mg at 05/17/20 1220  . dicyclomine (BENTYL) tablet 20 mg  20 mg Oral TID PRN Jesse Sans, MD      . FLUoxetine (PROZAC) capsule 40 mg  40 mg Oral Daily Jesse Sans, MD   40 mg  at 05/17/20 1219  . gabapentin (NEURONTIN) capsule 300 mg  300 mg Oral TID Jesse Sans, MD   300 mg at 05/17/20 1219  . loperamide (IMODIUM) capsule 2 mg  2 mg Oral Q4H PRN Jesse Sans, MD      . magnesium hydroxide (MILK OF MAGNESIA) suspension 30 mL  30 mL Oral Daily PRN Jesse Sans, MD      . methocarbamol (ROBAXIN) tablet 1,000 mg  1,000 mg Oral Q6H PRN Jesse Sans, MD      . nicotine (NICODERM CQ - dosed in mg/24 hours) patch 21 mg  21 mg Transdermal Daily Jesse Sans, MD   21 mg at 05/16/20 0943  . ondansetron (ZOFRAN) tablet 4 mg  4 mg Oral Q8H PRN Jesse Sans, MD      . prazosin (MINIPRESS) capsule 3 mg  3 mg Oral QHS Jesse Sans, MD      . QUEtiapine (SEROQUEL) tablet 400 mg  400 mg Oral QHS Jesse Sans, MD   400 mg at 05/16/20 2122  . traZODone (DESYREL) tablet 150 mg  150 mg Oral QHS Jesse Sans, MD   150 mg at 05/16/20 2122    Lab Results:  Results for orders placed or performed during the hospital encounter of 05/16/20 (from the past 48 hour(s))  HIV Antibody (routine testing w rflx)     Status: None   Collection Time: 05/16/20  3:02 PM  Result Value Ref Range   HIV Screen 4th Generation wRfx Non Reactive Non Reactive    Comment: Performed at [facility], [address] [place], [place] [address]  RPR     Status: None   Collection Time: 05/16/20  3:02 PM  Result Value Ref Range   RPR Ser Ql NON REACTIVE NON REACTIVE    Comment: Performed at [facility], [address] [place], [place] [address]  Hepatitis panel, acute     Status: None   Collection Time: 05/16/20  3:02 PM  Result Value Ref Range   Hepatitis B Surface Ag NON REACTIVE NON REACTIVE   HCV Ab NON REACTIVE NON REACTIVE    Comment: (NOTE) Nonreactive HCV antibody screen is consistent with no HCV infections,  unless recent infection is suspected or other evidence exists to indicate HCV infection.     Hep A IgM NON REACTIVE NON REACTIVE   Hep B C IgM  NON REACTIVE NON REACTIVE    Comment: Performed at [facility], [address], [place], [place] [address]  Lipid panel     Status: None   Collection Time: 05/17/20  6:41 AM  Result Value Ref Range   Cholesterol 150 0 - 200 mg/dL   Triglycerides 220 <254  mg/dL   HDL 58 >50 mg/dL   Total CHOL/HDL Ratio 2.6 RATIO   VLDL 28 0 - 40 mg/dL   LDL Cholesterol 64 0 - 99 mg/dL    Comment:        Total Cholesterol/HDL:CHD Risk Coronary Heart Disease Risk Table                     Men   Women  1/2 Average Risk   3.4   3.3  Average Risk       5.0   4.4  2 X Average Risk   9.6   7.1  3 X Average Risk  23.4   11.0        Use the calculated Patient Ratio above and the CHD Risk Table to determine the patient's CHD Risk.        ATP III CLASSIFICATION (LDL):  <100     mg/dL   Optimal  388-828  mg/dL   Near or Above                    Optimal  130-159  mg/dL   Borderline  003-491  mg/dL   High  >791     mg/dL   Very High Performed at [facility], [address] [place], [place] [address]     Blood Alcohol level:  Lab Results  Component Value Date   [facility] <10 05/15/2020   ETH <10 04/21/2020    Metabolic Disorder Labs: Lab Results  Component Value Date   HGBA1C 5.5 01/24/2020   MPG 111.15 01/24/2020   MPG 105.41 12/07/2019   No results found for: PROLACTIN Lab Results  Component Value Date   CHOL 150 05/17/2020   TRIG 141 05/17/2020   HDL 58 05/17/2020   CHOLHDL 2.6 05/17/2020   VLDL 28 05/17/2020   LDLCALC 64 05/17/2020   LDLCALC 67 01/24/2020    Physical Findings: AIMS:  , ,  ,  ,    CIWA:    COWS:     Musculoskeletal: Strength & Muscle Tone: within normal limits Gait & Station: normal Patient leans: N/A   Physical Exam: Physical Exam Vitals reviewed.    ROS  Blood pressure 120/75, pulse 91, temperature 98.2 F (36.8 C), temperature source Oral, resp. rate 18, height 5\' 6"  (1.676 m), weight 89.4 kg, last menstrual period 06/19/2018, SpO2 99  %. Body mass index is 31.8 kg/m.   Psychiatric Specialty Exam:  Presentation  General Appearance: Disheveled  Eye Contact: fair  Speech:Normal Rate  Speech Volume:Normal  Handedness:Right   Mood and Affect  Mood:Depressed; "so-so"  Affect:Congruent; Constricted   Thought Process  Thought Processes:Coherent  Descriptions of Associations:Intact  Orientation:Full (Time, Place and Person)  Thought Content:Perseveration  History of Schizophrenia/Schizoaffective disorder:No  Duration of Psychotic Symptoms:Less than six months  Hallucinations: denies today  Ideas of Reference:None  Suicidal Thoughts: Yes, without plan.  Homicidal Thoughts:Homicidal Thoughts: No   Sensorium  Memory:Immediate Fair  Judgment:Fair  Insight:Fair   Executive Functions  Concentration:Fair  Attention Span:Fair  Recall:Fair  Fund of Knowledge:Fair  Language:Fair   Psychomotor Activity  Psychomotor Activity:Psychomotor Activity: Decreased   Assets  Assets:Communication Skills; Desire for Improvement   Sleep  Sleep:Sleep: Fair    Treatment Plan Summary: Daily contact with patient to assess and evaluate symptoms and progress in treatment and Medication management  Patient is a 50 year old female with the above-stated past psychiatric history who is seen in follow-up.  Chart reviewed. Patient discussed  with nursing. Patient reports partial mood and sleep improvement today. No medication changes made today.   Plan:  -continue inpatient psych admission; 15-minute checks; daily contact with patient to assess and evaluate symptoms and progress in treatment; psychoeducation.  -continue scheduled medications: 1)MDD, recurrent, severe with psychotic features- established problem, unstable  - Continue Seroquel 400 mg nightly for mood and hallucinations, fluoxetine 40 mg daily, trazodone 150 mg QHS for insomnia  2) PTSD, chronic-  established problem, unstable  - Prozac as above, continue prazosin 3 mg QHS for nightmares  3) HTN- established problem  - Amlodipine 10 mg daily  4) Neuropathic pain  - Continue gabapentin 300 mg TID   -continue PRN medications.  acetaminophen, alum & mag hydroxide-simeth, dicyclomine, loperamide, magnesium hydroxide, methocarbamol, ondansetron  -Pertinent Labs: no new labs ordered today    -Consults: No new consults placed since yesterday    -Disposition: All necessary aftercare will be arranged prior to discharge. Patient is interested in residential SUD treatment.  -  I certify that the patient does need, on a daily basis, active treatment furnished directly by or requiring the supervision of inpatient psychiatric facility personnel.   Thalia Party, MD 05/17/2020, 12:30 PM
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
## 2020-05-17 NOTE — Progress Notes (Signed)
Patient alert and oriented x 4, affect is blunted, thoughts are organized and coherent, she currently denies HI and verbal contracts for safety. Thoughts of SI without a plan.  Patient ws noted isolated to her minimal interaction with peers and staff, she was offered emotional support and encouraged to attend evening wrap up group. Patient ambulates on the unit with a walker, she was complaint with medication regimen, 15 minutes safety checks maintained will continue to closely monitor.

## 2020-05-17 NOTE — Progress Notes (Signed)
Patient alert and oriented x 4, affect is blunted, thoughts are organized and coherent, she currently denies SI/HI/AVH and contracts for safety. Patient ws noted isolated to her minimal interaction with peers and staff, she was offered emotional support and encouraged to attend evening wrap up group. Patient ambulates on the unit with a walker, she was complaint with medication regimen, 15 minutes safety checks maintained will continue to closely monitor.

## 2020-05-18 DIAGNOSIS — F333 Major depressive disorder, recurrent, severe with psychotic symptoms: Secondary | ICD-10-CM | POA: Diagnosis not present

## 2020-05-18 NOTE — Progress Notes (Signed)
Patient has been pleasant and sad. Sleeping a lot. Cooperative. Came up to get her bedtime medication. Denies feeling suicidal but feels sad.

## 2020-05-18 NOTE — Plan of Care (Signed)
°  Problem: Education: °Goal: Knowledge of [place] [facility] Education information/materials will improve °Outcome: Progressing °Goal: Emotional status will improve °Outcome: Progressing °Goal: Mental status will improve °Outcome: Progressing °Goal: Verbalization of understanding the information provided will improve °Outcome: Progressing °  °Problem: Activity: °Goal: Interest or engagement in activities will improve °Outcome: Progressing °Goal: Sleeping patterns will improve °Outcome: Progressing °  °Problem: Coping: °Goal: Ability to verbalize frustrations and anger appropriately will improve °Outcome: Progressing °Goal: Ability to demonstrate self-control will improve °Outcome: Progressing °  °Problem: Health Behavior/Discharge Planning: °Goal: Identification of resources available to assist in meeting health care needs will improve °Outcome: Progressing °Goal: Compliance with treatment plan for underlying cause of condition will improve °Outcome: Progressing °  °Problem: Physical Regulation: °Goal: Ability to maintain clinical measurements within normal limits will improve °Outcome: Progressing °  °Problem: Safety: °Goal: Periods of time without injury will increase °Outcome: Progressing °  °Problem: Education: °Goal: Knowledge of disease or condition will improve °Outcome: Progressing °Goal: Understanding of discharge needs will improve °Outcome: Progressing °  °Problem: Health Behavior/Discharge Planning: °Goal: Ability to identify changes in lifestyle to reduce recurrence of condition will improve °Outcome: Progressing °Goal: Identification of resources available to assist in meeting health care needs will improve °Outcome: Progressing °  °Problem: Physical Regulation: °Goal: Complications related to the disease process, condition or treatment will be avoided or minimized °Outcome: Progressing °  °Problem: Safety: °Goal: Ability to remain free from injury will improve °Outcome: Progressing °  °Problem:  Education: °Goal: Ability to make informed decisions regarding treatment will improve °Outcome: Progressing °  °Problem: Coping: °Goal: Coping ability will improve °Outcome: Progressing °  °Problem: Health Behavior/Discharge Planning: °Goal: Identification of resources available to assist in meeting health care needs will improve °Outcome: Progressing °  °Problem: Medication: °Goal: Compliance with prescribed medication regimen will improve °Outcome: Progressing °  °Problem: Self-Concept: °Goal: Ability to disclose and discuss suicidal ideas will improve °Outcome: Progressing °Goal: Will verbalize positive feelings about self °Outcome: Progressing °  °

## 2020-05-18 NOTE — Progress Notes (Signed)
[facility] MD Progress Note  05/18/2020 11:29 AM Rachel Nolan  MRN:  [id number]  Principal Problem: MDD (major depressive disorder), recurrent, severe, with psychosis (HCC) Diagnosis: Principal Problem:   MDD (major depressive disorder), recurrent, severe, with psychosis (HCC) Active Problems:   Post traumatic stress disorder (PTSD)   Cocaine use disorder (HCC)   Substance induced mood disorder (HCC)   Alcohol use disorder, severe, dependence (HCC)   Opioid use disorder, moderate, dependence (HCC)  Rachel Nolan is a 50y.o. female with major depressive disorder, PTSD, and substance use disorder, presenting for worsening mood, suicidal ideations, and auditory hallucinations in settings on relapsing on drugs (cocaine and heroin).   Interval History Patient was seen today for re-evaluation.  Nursing reports no events overnight. The patient has no issues with performing ADLs.  Patient has been medication compliant.    Subjective:  On assessment patient reports "I feel a little better". She identifies her mood as "okay, better" today and denies suicidal thoughts or homicidal thoughts. She reports improved sleep, no nightmares. She denies auditory/visual hallucinations today. She continues to express her interest in residential substance-abuse treatment as she is unable to stay off drugs on her own.  Labs: no new results for review.  Total Time spent with patient: 20 minutes  Past Psychiatric History: see H&P  Past Medical History:  Past Medical History:  Diagnosis Date  . Anxiety   . Asthma   . Depression   . Hypertension   . Scoliosis     Past Surgical History:  Procedure Laterality Date  . BACK SURGERY    . ECTOPIC PREGNANCY SURGERY     Family History:  Family History  Problem Relation Age of Onset  . Hypertension Mother   . Hypertension Father    Family Psychiatric  History: see H&P Social History:  Social History   Substance and Sexual Activity  Alcohol Use Yes      Social History   Substance and Sexual Activity  Drug Use Yes  . Types: Marijuana, Cocaine, "Crack" cocaine    Social History   Socioeconomic History  . Marital status: Widowed    Spouse name: Not on file  . Number of children: Not on file  . Years of education: Not on file  . Highest education level: Not on file  Occupational History  . Not on file  Tobacco Use  . Smoking status: Current Every Day Smoker    Packs/day: 1.00    Types: Cigarettes  . Smokeless tobacco: Never Used  Vaping Use  . Vaping Use: Never used  Substance and Sexual Activity  . Alcohol use: Yes  . Drug use: Yes    Types: Marijuana, Cocaine, "Crack" cocaine  . Sexual activity: Not Currently  Other Topics Concern  . Not on file  Social History Narrative   Pt is homeless, no fixed address; not followed by an outpatient psychiatrist   Social Determinants of Health   Financial Resource Strain: Not on file  Food Insecurity: Not on file  Transportation Needs: Not on file  Physical Activity: Not on file  Stress: Not on file  Social Connections: Not on file   Additional Social History:                         Sleep: Fair  Appetite:  Good  Current Medications: Current Facility-Administered Medications  Medication Dose Route Frequency Provider Last Rate Last Admin  . acetaminophen (TYLENOL) tablet 650 mg  650 mg Oral Q6H PRN  Jesse SansFreeman, Megan M, MD      . alum & mag hydroxide-simeth (MAALOX/MYLANTA) 200-200-20 MG/5ML suspension 30 mL  30 mL Oral Q4H PRN Jesse SansFreeman, Megan M, MD      . amLODipine (NORVASC) tablet 10 mg  10 mg Oral Daily Jesse SansFreeman, Megan M, MD   10 mg at 05/18/20 0759  . dicyclomine (BENTYL) tablet 20 mg  20 mg Oral TID PRN Jesse SansFreeman, Megan M, MD      . FLUoxetine (PROZAC) capsule 40 mg  40 mg Oral Daily Jesse SansFreeman, Megan M, MD   40 mg at 05/18/20 0759  . gabapentin (NEURONTIN) capsule 300 mg  300 mg Oral TID Jesse SansFreeman, Megan M, MD   300 mg at 05/18/20 0759  . loperamide (IMODIUM) capsule 2  mg  2 mg Oral Q4H PRN Jesse SansFreeman, Megan M, MD      . magnesium hydroxide (MILK OF MAGNESIA) suspension 30 mL  30 mL Oral Daily PRN Jesse SansFreeman, Megan M, MD      . methocarbamol (ROBAXIN) tablet 1,000 mg  1,000 mg Oral Q6H PRN Jesse SansFreeman, Megan M, MD   1,000 mg at 05/18/20 0759  . nicotine (NICODERM CQ - dosed in mg/24 hours) patch 21 mg  21 mg Transdermal Daily Jesse SansFreeman, Megan M, MD   21 mg at 05/18/20 0759  . ondansetron (ZOFRAN) tablet 4 mg  4 mg Oral Q8H PRN Jesse SansFreeman, Megan M, MD      . prazosin (MINIPRESS) capsule 3 mg  3 mg Oral QHS Jesse SansFreeman, Megan M, MD   3 mg at 05/17/20 2136  . QUEtiapine (SEROQUEL) tablet 400 mg  400 mg Oral QHS Jesse SansFreeman, Megan M, MD   400 mg at 05/17/20 2136  . traZODone (DESYREL) tablet 150 mg  150 mg Oral QHS Jesse SansFreeman, Megan M, MD   150 mg at 05/17/20 2136    Lab Results:  Results for orders placed or performed during the hospital encounter of 05/16/20 (from the past 48 hour(s))  HIV Antibody (routine testing w rflx)     Status: None   Collection Time: 05/16/20  3:02 PM  Result Value Ref Range   HIV Screen 4th Generation wRfx Non Reactive Non Reactive    Comment: Performed at [facility], [address] [place], [place] [address]  RPR     Status: None   Collection Time: 05/16/20  3:02 PM  Result Value Ref Range   RPR Ser Ql NON REACTIVE NON REACTIVE    Comment: Performed at [facility], [address] [place], [place] [address]  Hepatitis panel, acute     Status: None   Collection Time: 05/16/20  3:02 PM  Result Value Ref Range   Hepatitis B Surface Ag NON REACTIVE NON REACTIVE   HCV Ab NON REACTIVE NON REACTIVE    Comment: (NOTE) Nonreactive HCV antibody screen is consistent with no HCV infections,  unless recent infection is suspected or other evidence exists to indicate HCV infection.     Hep A IgM NON REACTIVE NON REACTIVE   Hep B C IgM NON REACTIVE NON REACTIVE    Comment: Performed at [facility], [address] [place], [place] [address]   Lipid panel     Status: None   Collection Time: 05/17/20  6:41 AM  Result Value Ref Range   Cholesterol 150 0 - 200 mg/dL   Triglycerides 846141 <962<150 mg/dL   HDL 58 >95>40 mg/dL   Total CHOL/HDL Ratio 2.6 RATIO   VLDL 28 0 -  40 mg/dL   LDL Cholesterol 64 0 - 99 mg/dL    Comment:        Total Cholesterol/HDL:CHD Risk Coronary Heart Disease Risk Table                     Men   Women  1/2 Average Risk   3.4   3.3  Average Risk       5.0   4.4  2 X Average Risk   9.6   7.1  3 X Average Risk  23.4   11.0        Use the calculated Patient Ratio above and the CHD Risk Table to determine the patient's CHD Risk.        ATP III CLASSIFICATION (LDL):  <100     mg/dL   Optimal  294-765  mg/dL   Near or Above                    Optimal  130-159  mg/dL   Borderline  465-035  mg/dL   High  >465     mg/dL   Very High Performed at [facility], [address] [place], [place] [address]     Blood Alcohol level:  Lab Results  Component Value Date   [facility] <10 05/15/2020   ETH <10 04/21/2020    Metabolic Disorder Labs: Lab Results  Component Value Date   HGBA1C 5.5 01/24/2020   MPG 111.15 01/24/2020   MPG 105.41 12/07/2019   No results found for: PROLACTIN Lab Results  Component Value Date   CHOL 150 05/17/2020   TRIG 141 05/17/2020   HDL 58 05/17/2020   CHOLHDL 2.6 05/17/2020   VLDL 28 05/17/2020   LDLCALC 64 05/17/2020   LDLCALC 67 01/24/2020    Physical Findings: AIMS:  , ,  ,  ,    CIWA:    COWS:     Musculoskeletal: Strength & Muscle Tone: within normal limits Gait & Station: normal Patient leans: N/A   Physical Exam: Physical Exam Vitals reviewed.    ROS  Blood pressure 122/85, pulse (!) 107, temperature 98.2 F (36.8 C), temperature source Oral, resp. rate 18, height 5\' 6"  (1.676 m), weight 89.4 kg, last menstrual period 06/19/2018, SpO2 96 %. Body mass index is 31.8 kg/m.   Psychiatric Specialty Exam:  Presentation  General Appearance:  Disheveled  Eye Contact: fair  Speech:Normal Rate  Speech Volume:Normal  Handedness:Right   Mood and Affect  Mood:Depressed; "so-so"  Affect:Congruent; Constricted   Thought Process  Thought Processes:Coherent  Descriptions of Associations:Intact  Orientation:Full (Time, Place and Person)  Thought Content:Perseveration  History of Schizophrenia/Schizoaffective disorder:No  Duration of Psychotic Symptoms:Less than six months  Hallucinations: denies today  Ideas of Reference:None  Suicidal Thoughts: Yes, without plan.  Homicidal Thoughts:Homicidal Thoughts: No   Sensorium  Memory:Immediate Fair  Judgment:Fair  Insight:Fair   Executive Functions  Concentration:Fair  Attention Span:Fair  Recall:Fair  Fund of Knowledge:Fair  Language:Fair   Psychomotor Activity  Psychomotor Activity:Psychomotor Activity: Decreased   Assets  Assets:Communication Skills; Desire for Improvement   Sleep  Sleep:Sleep: Fair    Treatment Plan Summary: Daily contact with patient to assess and evaluate symptoms and progress in treatment and Medication management  Patient is a 50 year old female with the above-stated past psychiatric history who is seen in follow-up.  Chart reviewed. Patient discussed with nursing. Patient reports partial mood and sleep improvement today. No medication changes made today.   Plan:  -  continue inpatient psych admission; 15-minute checks; daily contact with patient to assess and evaluate symptoms and progress in treatment; psychoeducation.  -continue scheduled medications: 1)MDD, recurrent, severe with psychotic features- established problem, unstable  - Continue Seroquel 400 mg nightly for mood and hallucinations, fluoxetine 40 mg daily, trazodone 150 mg QHS for insomnia  2) PTSD, chronic- established problem, unstable  - Prozac as above, continue prazosin 3 mg QHS for nightmares  3) HTN-  established problem  - Amlodipine 10 mg daily  4) Neuropathic pain  - Continue gabapentin 300 mg TID   -continue PRN medications.  acetaminophen, alum & mag hydroxide-simeth, dicyclomine, loperamide, magnesium hydroxide, methocarbamol, ondansetron  -Pertinent Labs: no new labs ordered today    -Consults: No new consults placed since yesterday    -Disposition: All necessary aftercare will be arranged prior to discharge. Patient is interested in residential SUD treatment.  -  I certify that the patient does need, on a daily basis, active treatment furnished directly by or requiring the supervision of inpatient psychiatric facility personnel.   Thalia Party, MD 05/18/2020, 11:29 AM

## 2020-05-18 NOTE — Progress Notes (Signed)
D: Pt alert and oriented. Pt denies experiencing any anxiety/depression at this time. Pt reports experiencing poor sleep. Pt reports experiencing 8/10 left leg pain, PRN meds given and found helpful. Pt denies experiencing any SI/HI, or AVH at this time.   Pt refused afternoon dose of gabapentin, MD made aware. Pt stated she did not want afternoon dose and wanted to skip it but would take evening dose.  A: Scheduled medications administered to pt, per MD orders. Support and encouragement provided. Frequent verbal contact made. Routine safety checks conducted q15 minutes.   R: No adverse drug reactions noted. Pt verbally contracts for safety at this time. Pt complaint with medications and treatment plan. Pt interacts well with others on the unit. Pt remains safe at this time. Will continue to monitor.

## 2020-05-19 DIAGNOSIS — F333 Major depressive disorder, recurrent, severe with psychotic symptoms: Secondary | ICD-10-CM | POA: Diagnosis not present

## 2020-05-19 NOTE — Plan of Care (Signed)
Patient presents sad and depressed   Problem: Education: Goal: Emotional status will improve Outcome: Not Progressing Goal: Mental status will improve Outcome: Not Progressing

## 2020-05-19 NOTE — Progress Notes (Signed)
Recreation Therapy Notes  Date: 05/19/2020  Time: 10:00 am   Location: Craft room     Behavioral response: N/A   Intervention Topic: Self- esteem    Discussion/Intervention: Patient did not attend group.   Clinical Observations/Feedback:  Patient did not attend group.   Hayato Guaman LRT/CTRS        Krayton Wortley 05/19/2020 11:54 AM

## 2020-05-19 NOTE — Plan of Care (Signed)
°  Problem: Education: °Goal: Knowledge of [facility] Education information/materials will improve °Outcome: Progressing °Goal: Emotional status will improve °Outcome: Progressing °Goal: Mental status will improve °Outcome: Progressing °Goal: Verbalization of understanding the information provided will improve °Outcome: Progressing °  °Problem: Activity: °Goal: Interest or engagement in activities will improve °Outcome: Progressing °Goal: Sleeping patterns will improve °Outcome: Progressing °  °Problem: Coping: °Goal: Ability to verbalize frustrations and anger appropriately will improve °Outcome: Progressing °Goal: Ability to demonstrate self-control will improve °Outcome: Progressing °  °Problem: Health Behavior/Discharge Planning: °Goal: Identification of resources available to assist in meeting health care needs will improve °Outcome: Progressing °Goal: Compliance with treatment plan for underlying cause of condition will improve °Outcome: Progressing °  °Problem: Physical Regulation: °Goal: Ability to maintain clinical measurements within normal limits will improve °Outcome: Progressing °  °Problem: Safety: °Goal: Periods of time without injury will increase °Outcome: Progressing °  °Problem: Education: °Goal: Knowledge of disease or condition will improve °Outcome: Progressing °Goal: Understanding of discharge needs will improve °Outcome: Progressing °  °Problem: Health Behavior/Discharge Planning: °Goal: Ability to identify changes in lifestyle to reduce recurrence of condition will improve °Outcome: Progressing °Goal: Identification of resources available to assist in meeting health care needs will improve °Outcome: Progressing °  °Problem: Physical Regulation: °Goal: Complications related to the disease process, condition or treatment will be avoided or minimized °Outcome: Progressing °  °Problem: Safety: °Goal: Ability to remain free from injury will improve °Outcome: Progressing °  °Problem:  Education: °Goal: Ability to make informed decisions regarding treatment will improve °Outcome: Progressing °  °Problem: Coping: °Goal: Coping ability will improve °Outcome: Progressing °  °Problem: Health Behavior/Discharge Planning: °Goal: Identification of resources available to assist in meeting health care needs will improve °Outcome: Progressing °  °Problem: Medication: °Goal: Compliance with prescribed medication regimen will improve °Outcome: Progressing °  °Problem: Self-Concept: °Goal: Ability to disclose and discuss suicidal ideas will improve °Outcome: Progressing °Goal: Will verbalize positive feelings about self °Outcome: Progressing °  °

## 2020-05-19 NOTE — [facility] Counselor (Signed)
CSW spoke with pt regarding consents. Pt declined familial/collateral contacts but signed consent for residential substance use treatment. CSW informed her that she would be given information on programs for review. No other concerns expressed. Contact ended without incident.   Rachel Nolan. Algis Greenhouse, MSW, LCSW, LCAS 05/19/2020 2:24 PM

## 2020-05-19 NOTE — Progress Notes (Signed)
Recreation Therapy Notes  INPATIENT RECREATION THERAPY ASSESSMENT  Patient Details Name: Rachel Nolan MRN: [id number] DOB: 1970/01/29 Today's Date: 05/19/2020       Information Obtained From: Patient  Able to Participate in Assessment/Interview: Yes  Patient Presentation: Responsive  Reason for Admission (Per Patient): Active Symptoms,Med Non-Compliance,Suicidal Ideation,Substance Abuse  Patient Stressors: Office manager (Comment) (Homeless)  Coping Skills:   Isolation,Avoidance,Meditate,Other (Comment),Substance Abuse (Cry)  Leisure Interests (2+):  Exercise - Nada Maclachlan - TV  Frequency of Recreation/Participation: Monthly  Awareness of Community Resources:  Yes  Community Resources:  Church,Library,Movie Theaters,Park,Mall,[place],[employer],Recreation Center,Restaurants,[place]  Current Use: No  If no, Barriers?: Transportation,Attitudinal,Financial,Physical  Expressed Interest in [employer] Information: No  County of Residence:  [place]  Patient Main Form of Transportation: Other (Comment) (Friend)  Patient Strengths:  N/A  Patient Identified Areas of Improvement:  N/A  Patient Goal for Hospitalization:  Find housing for me and my son  Current SI (including self-harm):  No (No plan)  Current HI:  No  Current AVH: No  Staff Intervention Plan: Group Attendance,Collaborate with Interdisciplinary Treatment Team  Consent to Intern Participation: N/A  Brax Walen 05/19/2020, 2:03 PM

## 2020-05-19 NOTE — Progress Notes (Signed)
[facility] MD Progress Note  05/19/2020 3:48 PM Rachel Nolan  MRN:  [id number] Subjective: Follow-up for this 50 year old woman with PTSD depression and multiple substance abuse.  Patient says she is feeling nervous today.  Worried about the discharge plan.  Expresses great fear at having to go back "in the streets.  Says she feels like her mood is a little better and safer but still on edge.  Denies suicidal thoughts.  Denies acute psychotic symptoms. Principal Problem: MDD (major depressive disorder), recurrent, severe, with psychosis (HCC) Diagnosis: Principal Problem:   MDD (major depressive disorder), recurrent, severe, with psychosis (HCC) Active Problems:   Post traumatic stress disorder (PTSD)   Cocaine use disorder (HCC)   Substance induced mood disorder (HCC)   Alcohol use disorder, severe, dependence (HCC)   Opioid use disorder, moderate, dependence (HCC)  Total Time spent with patient: 30 minutes  Past Psychiatric History: Past history of depression and PTSD substance abuse  Past Medical History:  Past Medical History:  Diagnosis Date  . Anxiety   . Asthma   . Depression   . Hypertension   . Scoliosis     Past Surgical History:  Procedure Laterality Date  . BACK SURGERY    . ECTOPIC PREGNANCY SURGERY     Family History:  Family History  Problem Relation Age of Onset  . Hypertension Mother   . Hypertension Father    Family Psychiatric  History: See previous Social History:  Social History   Substance and Sexual Activity  Alcohol Use Yes     Social History   Substance and Sexual Activity  Drug Use Yes  . Types: Marijuana, Cocaine, "Crack" cocaine    Social History   Socioeconomic History  . Marital status: Widowed    Spouse name: Not on file  . Number of children: Not on file  . Years of education: Not on file  . Highest education level: Not on file  Occupational History  . Not on file  Tobacco Use  . Smoking status: Current Every Day Smoker     Packs/day: 1.00    Types: Cigarettes  . Smokeless tobacco: Never Used  Vaping Use  . Vaping Use: Never used  Substance and Sexual Activity  . Alcohol use: Yes  . Drug use: Yes    Types: Marijuana, Cocaine, "Crack" cocaine  . Sexual activity: Not Currently  Other Topics Concern  . Not on file  Social History Narrative   Pt is homeless, no fixed address; not followed by an outpatient psychiatrist   Social Determinants of Health   Financial Resource Strain: Not on file  Food Insecurity: Not on file  Transportation Needs: Not on file  Physical Activity: Not on file  Stress: Not on file  Social Connections: Not on file   Additional Social History:                         Sleep: Fair  Appetite:  Fair  Current Medications: Current Facility-Administered Medications  Medication Dose Route Frequency Provider Last Rate Last Admin  . acetaminophen (TYLENOL) tablet 650 mg  650 mg Oral Q6H PRN Jesse Sans, MD      . alum & mag hydroxide-simeth (MAALOX/MYLANTA) 200-200-20 MG/5ML suspension 30 mL  30 mL Oral Q4H PRN Jesse Sans, MD      . amLODipine (NORVASC) tablet 10 mg  10 mg Oral Daily Jesse Sans, MD   10 mg at 05/19/20 0801  . dicyclomine (BENTYL)  tablet 20 mg  20 mg Oral TID PRN Jesse Sans, MD      . FLUoxetine (PROZAC) capsule 40 mg  40 mg Oral Daily Jesse Sans, MD   40 mg at 05/19/20 0800  . gabapentin (NEURONTIN) capsule 300 mg  300 mg Oral TID Jesse Sans, MD   300 mg at 05/19/20 0801  . loperamide (IMODIUM) capsule 2 mg  2 mg Oral Q4H PRN Jesse Sans, MD      . magnesium hydroxide (MILK OF MAGNESIA) suspension 30 mL  30 mL Oral Daily PRN Jesse Sans, MD      . methocarbamol (ROBAXIN) tablet 1,000 mg  1,000 mg Oral Q6H PRN Jesse Sans, MD   1,000 mg at 05/18/20 0759  . nicotine (NICODERM CQ - dosed in mg/24 hours) patch 21 mg  21 mg Transdermal Daily Jesse Sans, MD   21 mg at 05/19/20 0806  . ondansetron (ZOFRAN)  tablet 4 mg  4 mg Oral Q8H PRN Jesse Sans, MD      . prazosin (MINIPRESS) capsule 3 mg  3 mg Oral QHS Jesse Sans, MD   3 mg at 05/18/20 2115  . QUEtiapine (SEROQUEL) tablet 400 mg  400 mg Oral QHS Jesse Sans, MD   400 mg at 05/18/20 2115  . traZODone (DESYREL) tablet 150 mg  150 mg Oral QHS Jesse Sans, MD   150 mg at 05/18/20 2115    Lab Results: No results found for this or any previous visit (from the past 48 hour(s)).  Blood Alcohol level:  Lab Results  Component Value Date   ETH <10 05/15/2020   ETH <10 04/21/2020    Metabolic Disorder Labs: Lab Results  Component Value Date   HGBA1C 5.5 01/24/2020   MPG 111.15 01/24/2020   MPG 105.41 12/07/2019   No results found for: PROLACTIN Lab Results  Component Value Date   CHOL 150 05/17/2020   TRIG 141 05/17/2020   HDL 58 05/17/2020   CHOLHDL 2.6 05/17/2020   VLDL 28 05/17/2020   LDLCALC 64 05/17/2020   LDLCALC 67 01/24/2020    Physical Findings: AIMS:  , ,  ,  ,    CIWA:    COWS:     Musculoskeletal: Strength & Muscle Tone: decreased Gait & Station: unsteady Patient leans: N/A  Psychiatric Specialty Exam:  Presentation  General Appearance: Disheveled  Eye Contact:Poor  Speech:Garbled  Speech Volume:Decreased  Handedness:Right   Mood and Affect  Mood:Depressed; Dysphoric  Affect:Congruent   Thought Process  Thought Processes:Goal Directed  Descriptions of Associations:Intact  Orientation:Full (Time, Place and Person)  Thought Content:Logical  History of Schizophrenia/Schizoaffective disorder:No  Duration of Psychotic Symptoms:Greater than six months  Hallucinations:No data recorded Ideas of Reference:None  Suicidal Thoughts:No data recorded Homicidal Thoughts:No data recorded  Sensorium  Memory:Immediate Fair; Recent Fair; Remote Fair  Judgment:Poor  Insight:Fair   Executive Functions  Concentration:Poor  Attention Span:Poor  Recall:Fair  Fund of  Knowledge:Fair  Language:Fair   Psychomotor Activity  Psychomotor Activity:No data recorded  Assets  Assets:Desire for Improvement; Social Support   Sleep  Sleep:No data recorded   Physical Exam: Physical Exam Vitals and nursing note reviewed.  Constitutional:      Appearance: Normal appearance.  HENT:     Head: Normocephalic and atraumatic.     Mouth/Throat:     Pharynx: Oropharynx is clear.  Eyes:     Pupils: Pupils are equal, round, and reactive to light.  Cardiovascular:     Rate and Rhythm: Normal rate and regular rhythm.  Pulmonary:     Effort: Pulmonary effort is normal.     Breath sounds: Normal breath sounds.  Abdominal:     General: Abdomen is flat.     Palpations: Abdomen is soft.  Musculoskeletal:        General: Normal range of motion.  Skin:    General: Skin is warm and dry.  Neurological:     General: No focal deficit present.     Mental Status: She is alert. Mental status is at baseline.  Psychiatric:        Attention and Perception: She is inattentive.        Mood and Affect: Mood is anxious and depressed.        Speech: Speech is tangential.        Behavior: Behavior is slowed.        Thought Content: Thought content normal. Thought content does not include homicidal or suicidal ideation.        Cognition and Memory: Memory is impaired.        Judgment: Judgment is impulsive.    Review of Systems  Constitutional: Negative.   HENT: Negative.   Eyes: Negative.   Respiratory: Negative.   Cardiovascular: Negative.   Gastrointestinal: Negative.   Musculoskeletal: Negative.   Skin: Negative.   Neurological: Negative.   Psychiatric/Behavioral: Positive for depression. The patient is nervous/anxious.    Blood pressure (!) 132/93, pulse (!) 114, temperature 98.6 F (37 C), temperature source Oral, resp. rate 18, height 5\' 6"  (1.676 m), weight 89.4 kg, last menstrual period 06/19/2018, SpO2 94 %. Body mass index is 31.8 kg/m.   Treatment  Plan Summary: Medication management and Plan Reviewed medication with patient.  Encouraged her to be up out of bed and interacting with others.  Nursing passed on to me today that the patient had declined a dose of gabapentin which seems harmless.  No current need to change her psychiatric medicine as she appears to be tolerating it and it may be helping with her mood stability.  Unclear length of stay possibly 3 to 4 days pending appropriate discharge planning.  08/19/2018, MD 05/19/2020, 3:48 PM

## 2020-05-19 NOTE — [facility] Group Notes (Signed)
LCSW Group Therapy Note   05/19/2020 2:32 PM  Type of Therapy and Topic:  Group Therapy:  Overcoming Obstacles   Participation Level:  Did Not Attend   Description of Group:    In this group patients will be encouraged to explore what they see as obstacles to their own wellness and recovery. They will be guided to discuss their thoughts, feelings, and behaviors related to these obstacles. The group will process together ways to cope with barriers, with attention given to specific choices patients can make. Each patient will be challenged to identify changes they are motivated to make in order to overcome their obstacles. This group will be process-oriented, with patients participating in exploration of their own experiences as well as giving and receiving support and challenge from other group members.   Therapeutic Goals: 1. Patient will identify personal and current obstacles as they relate to admission. 2. Patient will identify barriers that currently interfere with their wellness or overcoming obstacles.  3. Patient will identify feelings, thought process and behaviors related to these barriers. 4. Patient will identify two changes they are willing to make to overcome these obstacles:      Summary of Patient Progress Patient did not attend group despite encouraged participation.      Therapeutic Modalities:   Cognitive Behavioral Therapy Solution Focused Therapy Motivational Interviewing Relapse Prevention Therapy  Aiyanna Awtrey, MSW, LCSWA, LCASA 05/19/2020 2:32 PM    

## 2020-05-19 NOTE — Progress Notes (Signed)
Recreation Therapy Notes  INPATIENT RECREATION TR PLAN  Patient Details Name: Rachel Nolan MRN: [id number] DOB: [birth year] Today's Date: 05/19/2020  Rec Therapy Plan Is patient appropriate for Therapeutic Recreation?: Yes Treatment times per week: at least 3` Estimated Length of Stay: 5-7 days TR Treatment/Interventions: Group participation (Comment)  Discharge Criteria Pt will be discharged from therapy if:: Discharged Treatment plan/goals/alternatives discussed and agreed upon by:: Patient/family  Discharge Summary     Jebidiah Baggerly 05/19/2020, 2:04 PM

## 2020-05-19 NOTE — [facility] Suicide Risk Assessment (Signed)
[facility] INPATIENT:  Family/Significant Other Suicide Prevention Education  Suicide Prevention Education:  Patient Refusal for Family/Significant Other Suicide Prevention Education: The patient Rachel Nolan has refused to provide written consent for family/significant other to be provided Family/Significant Other Suicide Prevention Education during admission and/or prior to discharge.  Physician notified.  SPE completed with pt, as pt refused to consent to family contact. SPI pamphlet provided to pt and pt was encouraged to share information with support network, ask questions, and talk about any concerns relating to SPE. Pt denies access to guns/firearms and verbalized understanding of information provided. Mobile Crisis information also provided to pt.  Glenis Smoker 05/19/2020, 2:22 PM

## 2020-05-19 NOTE — Progress Notes (Signed)
Patient was tearful during medication pass. She said her 52 year old son had called her and wanted to know when they were going to find a place to stay. Her son is staying with her mother but she says she is not welcome at her mother's house. Denies SI and contracts for safety

## 2020-05-19 NOTE — Progress Notes (Signed)
Patient calm and cooperative during assessment denying SI/HI/AVH. Patient presents with a pleasant affect and was observed interacting appropriately with staff and peers on the unit. Patient compliant with medication administration per MD orders. Pt given education, support, and encouragement to be active in his treatment plan. Patient being monitored Q 15 minutes for safety per unit protocol. Pt remains safe on the unit.

## 2020-05-19 NOTE — Progress Notes (Signed)
D: Pt alert and oriented. Pt rates depression 6/10 and hopelessness 6/10. Pt reports energy level as low and concentration as being good. Pt reports sleep last night as being poor. Pt denies experiencing any pain at this time. Pt denies experiencing any SI/HI, or AVH at this time.   A: Scheduled medications administered to pt, per MD orders. Support and encouragement provided. Frequent verbal contact made. Routine safety checks conducted q15 minutes.   R: No adverse drug reactions noted. Pt verbally contracts for safety at this time. Pt complaint with medications and treatment plan. Pt interacts minimally with others on the unit. Pt remains safe at this time. Will continue to monitor.

## 2020-05-20 DIAGNOSIS — F333 Major depressive disorder, recurrent, severe with psychotic symptoms: Secondary | ICD-10-CM | POA: Diagnosis not present

## 2020-05-20 NOTE — Progress Notes (Signed)
Patient calm and cooperative during assessment denying SI/HI/AVH. Patient presents with a pleasant affect and was observed interacting appropriately with staff and peers on the unit. Patient compliant with medication administration per MD orders. Pt given education, support, and encouragement to be active in his treatment plan. Patient being monitored Q 15 minutes for safety per unit protocol. Pt remains safe on the unit. 

## 2020-05-20 NOTE — Plan of Care (Signed)
  Problem: Group Participation Goal: STG - Patient will engage in groups without prompting or encouragement from LRT x3 group sessions within 5 recreation therapy group sessions Description: STG - Patient will engage in groups without prompting or encouragement from LRT x3 group sessions within 5 recreation therapy group sessions Outcome: Not Progressing   

## 2020-05-20 NOTE — Progress Notes (Signed)
This writer assumed care of patient at 1500. Patient has been sleeping in her room, without any issues. Patient remains safe on the unit at this time.

## 2020-05-20 NOTE — [facility] Counselor (Signed)
CSW sent pt referrals for inpatient treatment to [facility] and [facility]. CSW will finish referrals to [facility] and possibly [facility] if pt is an applicable candidate.    Rachel Nolan, MSW, LCSW-A 5/3/20223:00 PM

## 2020-05-20 NOTE — Progress Notes (Signed)
Recreation Therapy Notes   Date: 05/20/2020  Time: 9:30 am   Location: Courtyard  Behavioral response: N/A   Intervention Topic: Social skills    Discussion/Intervention: Patient did not attend group.   Clinical Observations/Feedback:  Patient did not attend group.   Emre Stock LRT/CTRS         Sharifa Bucholz 05/20/2020 11:04 AM

## 2020-05-20 NOTE — Progress Notes (Signed)
This writer went to get patient for evening med pass, and she stated that she was not ready for it yet. Patient stated that she was going to take her medication at 1800.

## 2020-05-20 NOTE — [facility] Group Notes (Signed)
LCSW Group Therapy Note     05/20/2020 2:54 PM     Type of Therapy/Topic:  Group Therapy:  Feelings about Diagnosis     Participation Level:  Did Not Attend     Description of Group:   This group will allow patients to explore their thoughts and feelings about diagnoses they have received. Patients will be guided to explore their level of understanding and acceptance of these diagnoses. Facilitator will encourage patients to process their thoughts and feelings about the reactions of others to their diagnosis and will guide patients in identifying ways to discuss their diagnosis with significant others in their lives. This group will be process-oriented, with patients participating in exploration of their own experiences, giving and receiving support, and processing challenge from other group members.        Therapeutic Goals:  1.    Patient will demonstrate understanding of diagnosis as evidenced by identifying two or more symptoms of the disorder  2.    Patient will be able to express two feelings regarding the diagnosis  3.    Patient will demonstrate their ability to communicate their needs through discussion and/or role play     Summary of Patient Progress: X  Therapeutic Modalities:   Cognitive Behavioral Therapy  Brief Therapy  Feelings Identification    Tomy Khim [place], MSW, LCSW-A  05/20/2020 2:54 PM

## 2020-05-20 NOTE — Plan of Care (Signed)
Patient presents at her baseline   Problem: Education: Goal: Emotional status will improve Outcome: Not Progressing Goal: Mental status will improve Outcome: Not Progressing   

## 2020-05-20 NOTE — Progress Notes (Signed)
Patient is calm and cooperative during assessment. She is alert and oriented x4. Affect is pleasant. Patient denies SI, HI, and AVH. She says. "I am feeling okay." Patient says she has some hip pain, but does not need any PRN medication for it. Patient asked for sleep medication with her morning medications, but verbalized understanding when this writer told her she could not have sleep medications in the morning. Patient reported that she could not sleep last night despite taking trazodone and Seroquel.   Patient remains medication compliant. Support and encouragement is provided. Patient is observed to be interacting appropriately with staff and peers on the unit, but isolates to her room outside of meal times. Patient remains safe on the unit at this time and q15 min safety checks have been maintained.

## 2020-05-20 NOTE — Progress Notes (Signed)
[facility] MD Progress Note  05/20/2020 5:19 PM Uriel Horkey  MRN:  [id number] Subjective: Follow-up for this 50 year old woman with substance abuse and depression.  Patient seen.  Patient spends most of her time in bed.  When I ask her about it she said she just sits there thinking and worrying about things in her life but does not seem to be coming to any conclusions.  I encouraged her to try getting up and attending groups and talking about her problems a little more.  Patient has been homeless and has no place to go and wants to go to rehab.  Mood is a little better although she remains withdrawn and blunted.  Denies suicidal thought. Principal Problem: MDD (major depressive disorder), recurrent, severe, with psychosis (HCC) Diagnosis: Principal Problem:   MDD (major depressive disorder), recurrent, severe, with psychosis (HCC) Active Problems:   Post traumatic stress disorder (PTSD)   Cocaine use disorder (HCC)   Substance induced mood disorder (HCC)   Alcohol use disorder, severe, dependence (HCC)   Opioid use disorder, moderate, dependence (HCC)  Total Time spent with patient: 30 minutes  Past Psychiatric History: Past history of alcohol abuse anxiety and depressive symptoms  Past Medical History:  Past Medical History:  Diagnosis Date  . Anxiety   . Asthma   . Depression   . Hypertension   . Scoliosis     Past Surgical History:  Procedure Laterality Date  . BACK SURGERY    . ECTOPIC PREGNANCY SURGERY     Family History:  Family History  Problem Relation Age of Onset  . Hypertension Mother   . Hypertension Father    Family Psychiatric  History: See previous Social History:  Social History   Substance and Sexual Activity  Alcohol Use Yes     Social History   Substance and Sexual Activity  Drug Use Yes  . Types: Marijuana, Cocaine, "Crack" cocaine    Social History   Socioeconomic History  . Marital status: Widowed    Spouse name: Not on file  . Number of  children: Not on file  . Years of education: Not on file  . Highest education level: Not on file  Occupational History  . Not on file  Tobacco Use  . Smoking status: Current Every Day Smoker    Packs/day: 1.00    Types: Cigarettes  . Smokeless tobacco: Never Used  Vaping Use  . Vaping Use: Never used  Substance and Sexual Activity  . Alcohol use: Yes  . Drug use: Yes    Types: Marijuana, Cocaine, "Crack" cocaine  . Sexual activity: Not Currently  Other Topics Concern  . Not on file  Social History Narrative   Pt is homeless, no fixed address; not followed by an outpatient psychiatrist   Social Determinants of Health   Financial Resource Strain: Not on file  Food Insecurity: Not on file  Transportation Needs: Not on file  Physical Activity: Not on file  Stress: Not on file  Social Connections: Not on file   Additional Social History:                         Sleep: Fair  Appetite:  Fair  Current Medications: Current Facility-Administered Medications  Medication Dose Route Frequency Provider Last Rate Last Admin  . acetaminophen (TYLENOL) tablet 650 mg  650 mg Oral Q6H PRN Jesse Sans, MD      . alum & mag hydroxide-simeth (MAALOX/MYLANTA) 200-200-20 MG/5ML suspension 30  mL  30 mL Oral Q4H PRN Jesse Sans, MD      . amLODipine (NORVASC) tablet 10 mg  10 mg Oral Daily Jesse Sans, MD   10 mg at 05/20/20 0801  . dicyclomine (BENTYL) tablet 20 mg  20 mg Oral TID PRN Jesse Sans, MD      . FLUoxetine (PROZAC) capsule 40 mg  40 mg Oral Daily Jesse Sans, MD   40 mg at 05/20/20 0801  . gabapentin (NEURONTIN) capsule 300 mg  300 mg Oral TID Jesse Sans, MD   300 mg at 05/20/20 1215  . loperamide (IMODIUM) capsule 2 mg  2 mg Oral Q4H PRN Jesse Sans, MD      . magnesium hydroxide (MILK OF MAGNESIA) suspension 30 mL  30 mL Oral Daily PRN Jesse Sans, MD      . methocarbamol (ROBAXIN) tablet 1,000 mg  1,000 mg Oral Q6H PRN Jesse Sans, MD   1,000 mg at 05/18/20 0759  . nicotine (NICODERM CQ - dosed in mg/24 hours) patch 21 mg  21 mg Transdermal Daily Jesse Sans, MD   21 mg at 05/20/20 0175  . ondansetron (ZOFRAN) tablet 4 mg  4 mg Oral Q8H PRN Jesse Sans, MD      . prazosin (MINIPRESS) capsule 3 mg  3 mg Oral QHS Jesse Sans, MD   3 mg at 05/19/20 2112  . QUEtiapine (SEROQUEL) tablet 400 mg  400 mg Oral QHS Jesse Sans, MD   400 mg at 05/19/20 2112  . traZODone (DESYREL) tablet 150 mg  150 mg Oral QHS Jesse Sans, MD   150 mg at 05/19/20 2113    Lab Results: No results found for this or any previous visit (from the past 48 hour(s)).  Blood Alcohol level:  Lab Results  Component Value Date   ETH <10 05/15/2020   ETH <10 04/21/2020    Metabolic Disorder Labs: Lab Results  Component Value Date   HGBA1C 5.5 01/24/2020   MPG 111.15 01/24/2020   MPG 105.41 12/07/2019   No results found for: PROLACTIN Lab Results  Component Value Date   CHOL 150 05/17/2020   TRIG 141 05/17/2020   HDL 58 05/17/2020   CHOLHDL 2.6 05/17/2020   VLDL 28 05/17/2020   LDLCALC 64 05/17/2020   LDLCALC 67 01/24/2020    Physical Findings: AIMS:  , ,  ,  ,    CIWA:    COWS:     Musculoskeletal: Strength & Muscle Tone: decreased Gait & Station: normal Patient leans: N/A  Psychiatric Specialty Exam:  Presentation  General Appearance: Disheveled  Eye Contact:Poor  Speech:Garbled  Speech Volume:Decreased  Handedness:Right   Mood and Affect  Mood:Depressed; Dysphoric  Affect:Congruent   Thought Process  Thought Processes:Goal Directed  Descriptions of Associations:Intact  Orientation:Full (Time, Place and Person)  Thought Content:Logical  History of Schizophrenia/Schizoaffective disorder:No  Duration of Psychotic Symptoms:Greater than six months  Hallucinations:No data recorded Ideas of Reference:None  Suicidal Thoughts:No data recorded Homicidal Thoughts:No data  recorded  Sensorium  Memory:Immediate Fair; Recent Fair; Remote Fair  Judgment:Poor  Insight:Fair   Executive Functions  Concentration:Poor  Attention Span:Poor  Recall:Fair  Fund of Knowledge:Fair  Language:Fair   Psychomotor Activity  Psychomotor Activity:No data recorded  Assets  Assets:Desire for Improvement; Social Support   Sleep  Sleep:No data recorded   Physical Exam: Physical Exam Vitals and nursing note reviewed.  Constitutional:  Appearance: Normal appearance.  HENT:     Head: Normocephalic and atraumatic.     Mouth/Throat:     Pharynx: Oropharynx is clear.  Eyes:     Pupils: Pupils are equal, round, and reactive to light.  Cardiovascular:     Rate and Rhythm: Normal rate and regular rhythm.  Pulmonary:     Effort: Pulmonary effort is normal.     Breath sounds: Normal breath sounds.  Abdominal:     General: Abdomen is flat.     Palpations: Abdomen is soft.  Musculoskeletal:        General: Normal range of motion.  Skin:    General: Skin is warm and dry.  Neurological:     General: No focal deficit present.     Mental Status: She is alert. Mental status is at baseline.  Psychiatric:        Attention and Perception: She is inattentive.        Mood and Affect: Mood normal. Affect is blunt.        Speech: Speech is delayed.        Behavior: Behavior is slowed.        Thought Content: Thought content normal.        Cognition and Memory: Memory is impaired.    Review of Systems  Constitutional: Positive for malaise/fatigue.  HENT: Negative.   Eyes: Negative.   Respiratory: Negative.   Cardiovascular: Negative.   Gastrointestinal: Negative.   Musculoskeletal: Negative.   Skin: Negative.   Neurological: Negative.   Psychiatric/Behavioral: Positive for depression. Negative for suicidal ideas. The patient is nervous/anxious.    Blood pressure (!) 144/84, pulse 99, temperature 98.7 F (37.1 C), temperature source Oral, resp. rate  16, height 5\' 6"  (1.676 m), weight 89.4 kg, last menstrual period 06/19/2018, SpO2 94 %. Body mass index is 31.8 kg/m.   Treatment Plan Summary: Medication management and Plan Really suggested that she get up out of bed and attend some groups if she thinks that rehab is going to be helpful for her.  Treatment team reviewed plan we will try referring her for residential substance abuse treatment.  No immediate changes to any prescription medicine today.  Blood pressure is still a little high but does not appear to be having withdrawal symptoms.  08/19/2018, MD 05/20/2020, 5:19 PM

## 2020-05-21 DIAGNOSIS — F333 Major depressive disorder, recurrent, severe with psychotic symptoms: Secondary | ICD-10-CM | POA: Diagnosis not present

## 2020-05-21 NOTE — Progress Notes (Signed)
Patient pleasant and cooperative. Reports feeling somewhat better today. Continues to be isolative to room and self. Comes out for snacks and meds. Patient smiling during assessment, reports she is not sleeping well at night. Reports would like an increase in sleep meds. Pt denies any SI, Hi, AVH and is medication compliant.  Encouragement and support provided. Safety checks maintained. Medications given as prescribed. Pt receptive and remains safe on unit with q 15 min checks.

## 2020-05-21 NOTE — Progress Notes (Signed)
[facility] MD Progress Note  05/21/2020 7:54 PM Rachel Nolan  MRN:  [id number] Subjective: Follow-up for this patient with depression and substance abuse.  Again the patient is mostly in her room although she attended some groups today.  Affect looks a little more engaged although she still is very blunted.  Focused on finding a place to live she is cooperating with staff and looking into rehab facilities Principal Problem: MDD (major depressive disorder), recurrent, severe, with psychosis (HCC) Diagnosis: Principal Problem:   MDD (major depressive disorder), recurrent, severe, with psychosis (HCC) Active Problems:   Post traumatic stress disorder (PTSD)   Cocaine use disorder (HCC)   Substance induced mood disorder (HCC)   Alcohol use disorder, severe, dependence (HCC)   Opioid use disorder, moderate, dependence (HCC)  Total Time spent with patient: 30 minutes  Past Psychiatric History: Past history of substance abuse primarily  Past Medical History:  Past Medical History:  Diagnosis Date  . Anxiety   . Asthma   . Depression   . Hypertension   . Scoliosis     Past Surgical History:  Procedure Laterality Date  . BACK SURGERY    . ECTOPIC PREGNANCY SURGERY     Family History:  Family History  Problem Relation Age of Onset  . Hypertension Mother   . Hypertension Father    Family Psychiatric  History: See previous Social History:  Social History   Substance and Sexual Activity  Alcohol Use Yes     Social History   Substance and Sexual Activity  Drug Use Yes  . Types: Marijuana, Cocaine, "Crack" cocaine    Social History   Socioeconomic History  . Marital status: Widowed    Spouse name: Not on file  . Number of children: Not on file  . Years of education: Not on file  . Highest education level: Not on file  Occupational History  . Not on file  Tobacco Use  . Smoking status: Current Every Day Smoker    Packs/day: 1.00    Types: Cigarettes  . Smokeless tobacco:  Never Used  Vaping Use  . Vaping Use: Never used  Substance and Sexual Activity  . Alcohol use: Yes  . Drug use: Yes    Types: Marijuana, Cocaine, "Crack" cocaine  . Sexual activity: Not Currently  Other Topics Concern  . Not on file  Social History Narrative   Pt is homeless, no fixed address; not followed by an outpatient psychiatrist   Social Determinants of Health   Financial Resource Strain: Not on file  Food Insecurity: Not on file  Transportation Needs: Not on file  Physical Activity: Not on file  Stress: Not on file  Social Connections: Not on file   Additional Social History:                         Sleep: Fair  Appetite:  Fair  Current Medications: Current Facility-Administered Medications  Medication Dose Route Frequency Provider Last Rate Last Admin  . acetaminophen (TYLENOL) tablet 650 mg  650 mg Oral Q6H PRN Jesse Sans, MD      . alum & mag hydroxide-simeth (MAALOX/MYLANTA) 200-200-20 MG/5ML suspension 30 mL  30 mL Oral Q4H PRN Jesse Sans, MD      . amLODipine (NORVASC) tablet 10 mg  10 mg Oral Daily Jesse Sans, MD   10 mg at 05/21/20 0757  . dicyclomine (BENTYL) tablet 20 mg  20 mg Oral TID PRN Les Pou  M, MD      . FLUoxetine (PROZAC) capsule 40 mg  40 mg Oral Daily Jesse Sans, MD   40 mg at 05/21/20 0757  . gabapentin (NEURONTIN) capsule 300 mg  300 mg Oral TID Jesse Sans, MD   300 mg at 05/21/20 1648  . loperamide (IMODIUM) capsule 2 mg  2 mg Oral Q4H PRN Jesse Sans, MD      . magnesium hydroxide (MILK OF MAGNESIA) suspension 30 mL  30 mL Oral Daily PRN Jesse Sans, MD      . methocarbamol (ROBAXIN) tablet 1,000 mg  1,000 mg Oral Q6H PRN Jesse Sans, MD   1,000 mg at 05/18/20 0759  . nicotine (NICODERM CQ - dosed in mg/24 hours) patch 21 mg  21 mg Transdermal Daily Jesse Sans, MD   21 mg at 05/21/20 0758  . ondansetron (ZOFRAN) tablet 4 mg  4 mg Oral Q8H PRN Jesse Sans, MD      .  prazosin (MINIPRESS) capsule 3 mg  3 mg Oral QHS Jesse Sans, MD   3 mg at 05/20/20 2113  . QUEtiapine (SEROQUEL) tablet 400 mg  400 mg Oral QHS Jesse Sans, MD   400 mg at 05/20/20 2113  . traZODone (DESYREL) tablet 150 mg  150 mg Oral QHS Jesse Sans, MD   150 mg at 05/20/20 2113    Lab Results: No results found for this or any previous visit (from the past 48 hour(s)).  Blood Alcohol level:  Lab Results  Component Value Date   ETH <10 05/15/2020   ETH <10 04/21/2020    Metabolic Disorder Labs: Lab Results  Component Value Date   HGBA1C 5.5 01/24/2020   MPG 111.15 01/24/2020   MPG 105.41 12/07/2019   No results found for: PROLACTIN Lab Results  Component Value Date   CHOL 150 05/17/2020   TRIG 141 05/17/2020   HDL 58 05/17/2020   CHOLHDL 2.6 05/17/2020   VLDL 28 05/17/2020   LDLCALC 64 05/17/2020   LDLCALC 67 01/24/2020    Physical Findings: AIMS:  , ,  ,  ,    CIWA:    COWS:     Musculoskeletal: Strength & Muscle Tone: within normal limits Gait & Station: normal Patient leans: N/A  Psychiatric Specialty Exam:  Presentation  General Appearance: Disheveled  Eye Contact:Poor  Speech:Garbled  Speech Volume:Decreased  Handedness:Right   Mood and Affect  Mood:Depressed; Dysphoric  Affect:Congruent   Thought Process  Thought Processes:Goal Directed  Descriptions of Associations:Intact  Orientation:Full (Time, Place and Person)  Thought Content:Logical  History of Schizophrenia/Schizoaffective disorder:No  Duration of Psychotic Symptoms:Greater than six months  Hallucinations:No data recorded Ideas of Reference:None  Suicidal Thoughts:No data recorded Homicidal Thoughts:No data recorded  Sensorium  Memory:Immediate Fair; Recent Fair; Remote Fair  Judgment:Poor  Insight:Fair   Executive Functions  Concentration:Poor  Attention Span:Poor  Recall:Fair  Fund of Knowledge:Fair  Language:Fair   Psychomotor  Activity  Psychomotor Activity:No data recorded  Assets  Assets:Desire for Improvement; Social Support   Sleep  Sleep:No data recorded   Physical Exam: Physical Exam Vitals and nursing note reviewed.  Constitutional:      Appearance: Normal appearance.  HENT:     Head: Normocephalic and atraumatic.     Mouth/Throat:     Pharynx: Oropharynx is clear.  Eyes:     Pupils: Pupils are equal, round, and reactive to light.  Cardiovascular:     Rate and Rhythm: Normal  rate and regular rhythm.  Pulmonary:     Effort: Pulmonary effort is normal.     Breath sounds: Normal breath sounds.  Abdominal:     General: Abdomen is flat.     Palpations: Abdomen is soft.  Musculoskeletal:        General: Normal range of motion.  Skin:    General: Skin is warm and dry.  Neurological:     General: No focal deficit present.     Mental Status: She is alert. Mental status is at baseline.  Psychiatric:        Attention and Perception: Attention normal.        Mood and Affect: Mood is depressed.        Speech: Speech is delayed.        Behavior: Behavior is withdrawn.        Thought Content: Thought content normal.        Cognition and Memory: Cognition normal.        Judgment: Judgment normal.    Review of Systems  Constitutional: Negative.   HENT: Negative.   Eyes: Negative.   Respiratory: Negative.   Cardiovascular: Negative.   Gastrointestinal: Negative.   Musculoskeletal: Negative.   Skin: Negative.   Neurological: Negative.   Psychiatric/Behavioral: Positive for depression. Negative for suicidal ideas.   Blood pressure 118/88, pulse (!) 103, temperature 98.4 F (36.9 C), temperature source Oral, resp. rate 18, height 5\' 6"  (1.676 m), weight 89.4 kg, last menstrual period 06/19/2018, SpO2 96 %. Body mass index is 31.8 kg/m.   Treatment Plan Summary: Plan No change to medicine.  Spent some time encouraging patient in going to groups and encouraged her to try to get up out of  bed be more interactive as I think she is spending too much time just brooding and feeling sad.  No change to medicine for today.  Hoping to find some discharge plans soon.  08/19/2018, MD 05/21/2020, 7:54 PM

## 2020-05-21 NOTE — Tx Team (Signed)
Interdisciplinary Treatment and Diagnostic Plan Update  05/21/2020 Time of Session: 08:30AM Rachel Nolan MRN: [id number]  Principal Diagnosis: MDD (major depressive disorder), recurrent, severe, with psychosis (HCC)  Secondary Diagnoses: Principal Problem:   MDD (major depressive disorder), recurrent, severe, with psychosis (HCC) Active Problems:   Post traumatic stress disorder (PTSD)   Cocaine use disorder (HCC)   Substance induced mood disorder (HCC)   Alcohol use disorder, severe, dependence (HCC)   Opioid use disorder, moderate, dependence (HCC)   Current Medications:  Current Facility-Administered Medications  Medication Dose Route Frequency Provider Last Rate Last Admin  . acetaminophen (TYLENOL) tablet 650 mg  650 mg Oral Q6H PRN Jesse Sans, MD      . alum & mag hydroxide-simeth (MAALOX/MYLANTA) 200-200-20 MG/5ML suspension 30 mL  30 mL Oral Q4H PRN Jesse Sans, MD      . amLODipine (NORVASC) tablet 10 mg  10 mg Oral Daily Jesse Sans, MD   10 mg at 05/21/20 0757  . dicyclomine (BENTYL) tablet 20 mg  20 mg Oral TID PRN Jesse Sans, MD      . FLUoxetine (PROZAC) capsule 40 mg  40 mg Oral Daily Jesse Sans, MD   40 mg at 05/21/20 0757  . gabapentin (NEURONTIN) capsule 300 mg  300 mg Oral TID Jesse Sans, MD   300 mg at 05/21/20 0757  . loperamide (IMODIUM) capsule 2 mg  2 mg Oral Q4H PRN Jesse Sans, MD      . magnesium hydroxide (MILK OF MAGNESIA) suspension 30 mL  30 mL Oral Daily PRN Jesse Sans, MD      . methocarbamol (ROBAXIN) tablet 1,000 mg  1,000 mg Oral Q6H PRN Jesse Sans, MD   1,000 mg at 05/18/20 0759  . nicotine (NICODERM CQ - dosed in mg/24 hours) patch 21 mg  21 mg Transdermal Daily Jesse Sans, MD   21 mg at 05/21/20 0758  . ondansetron (ZOFRAN) tablet 4 mg  4 mg Oral Q8H PRN Jesse Sans, MD      . prazosin (MINIPRESS) capsule 3 mg  3 mg Oral QHS Jesse Sans, MD   3 mg at 05/20/20 2113  . QUEtiapine  (SEROQUEL) tablet 400 mg  400 mg Oral QHS Jesse Sans, MD   400 mg at 05/20/20 2113  . traZODone (DESYREL) tablet 150 mg  150 mg Oral QHS Jesse Sans, MD   150 mg at 05/20/20 2113   PTA Medications: Medications Prior to Admission  Medication Sig Dispense Refill Last Dose  . amLODipine (NORVASC) 10 MG tablet Take 1 tablet (10 mg total) by mouth daily. 30 tablet 1   . amLODipine (NORVASC) 10 MG tablet TAKE 1 TABLET (10 MG TOTAL) BY MOUTH DAILY. 7 tablet 0   . FLUoxetine (PROZAC) 20 MG capsule TAKE 3 CAPSULES (60 MG TOTAL) BY MOUTH DAILY. 21 capsule 0   . FLUoxetine (PROZAC) 40 MG capsule Take 40 mg by mouth daily.     Marland Kitchen gabapentin (NEURONTIN) 300 MG capsule Take 2 capsules (600 mg total) by mouth 3 (three) times daily. (Patient taking differently: Take 300 mg by mouth 3 (three) times daily.) 180 capsule 1   . gabapentin (NEURONTIN) 300 MG capsule TAKE 2 CAPSULES (600 MG) BY MOUTH 3 TIMES DAILY. 42 capsule 0   . prazosin (MINIPRESS) 1 MG capsule Take 3 capsules (3 mg total) by mouth at bedtime. 90 capsule 1   . prazosin (MINIPRESS) 1 MG  capsule TAKE 3 CAPSULES (3 MG) BY MOUTH AT BEDTIME. 21 capsule 0   . QUEtiapine (SEROQUEL) 200 MG tablet TAKE 2 TABLETS (400 MG) BY MOUTH AT BEDTIME. 14 tablet 0   . QUEtiapine (SEROQUEL) 400 MG tablet Take 1 tablet (400 mg total) by mouth at bedtime. 30 tablet 1   . traZODone (DESYREL) 150 MG tablet Take 1 tablet (150 mg total) by mouth at bedtime. 30 tablet 1   . traZODone (DESYREL) 150 MG tablet TAKE 1 TABLET BY MOUTH AT BEDTIME. 7 tablet 0     Patient Stressors: Substance abuse  Patient Strengths: Communication skills  Treatment Modalities: Medication Management, Group therapy, Case management,  1 to 1 session with clinician, Psychoeducation, Recreational therapy.   Physician Treatment Plan for Primary Diagnosis: MDD (major depressive disorder), recurrent, severe, with psychosis (HCC) Long Term Goal(s): Improvement in symptoms so as ready for  discharge Improvement in symptoms so as ready for discharge   Short Term Goals: Ability to identify changes in lifestyle to reduce recurrence of condition will improve Ability to verbalize feelings will improve Ability to disclose and discuss suicidal ideas Ability to demonstrate self-control will improve Ability to identify and develop effective coping behaviors will improve Ability to maintain clinical measurements within normal limits will improve Compliance with prescribed medications will improve Ability to identify triggers associated with substance abuse/mental health issues will improve Ability to identify changes in lifestyle to reduce recurrence of condition will improve Ability to verbalize feelings will improve Ability to disclose and discuss suicidal ideas Ability to demonstrate self-control will improve Ability to identify and develop effective coping behaviors will improve Ability to maintain clinical measurements within normal limits will improve Compliance with prescribed medications will improve Ability to identify triggers associated with substance abuse/mental health issues will improve  Medication Management: Evaluate patient's response, side effects, and tolerance of medication regimen.  Therapeutic Interventions: 1 to 1 sessions, Unit Group sessions and Medication administration.  Evaluation of Outcomes: Progressing  Physician Treatment Plan for Secondary Diagnosis: Principal Problem:   MDD (major depressive disorder), recurrent, severe, with psychosis (HCC) Active Problems:   Post traumatic stress disorder (PTSD)   Cocaine use disorder (HCC)   Substance induced mood disorder (HCC)   Alcohol use disorder, severe, dependence (HCC)   Opioid use disorder, moderate, dependence (HCC)  Long Term Goal(s): Improvement in symptoms so as ready for discharge Improvement in symptoms so as ready for discharge   Short Term Goals: Ability to identify changes in lifestyle  to reduce recurrence of condition will improve Ability to verbalize feelings will improve Ability to disclose and discuss suicidal ideas Ability to demonstrate self-control will improve Ability to identify and develop effective coping behaviors will improve Ability to maintain clinical measurements within normal limits will improve Compliance with prescribed medications will improve Ability to identify triggers associated with substance abuse/mental health issues will improve Ability to identify changes in lifestyle to reduce recurrence of condition will improve Ability to verbalize feelings will improve Ability to disclose and discuss suicidal ideas Ability to demonstrate self-control will improve Ability to identify and develop effective coping behaviors will improve Ability to maintain clinical measurements within normal limits will improve Compliance with prescribed medications will improve Ability to identify triggers associated with substance abuse/mental health issues will improve     Medication Management: Evaluate patient's response, side effects, and tolerance of medication regimen.  Therapeutic Interventions: 1 to 1 sessions, Unit Group sessions and Medication administration.  Evaluation of Outcomes: Progressing   RN Treatment  Plan for Primary Diagnosis: MDD (major depressive disorder), recurrent, severe, with psychosis (HCC) Long Term Goal(s): Knowledge of disease and therapeutic regimen to maintain health will improve  Short Term Goals: Ability to remain free from injury will improve, Ability to verbalize frustration and anger appropriately will improve, Ability to demonstrate self-control, Ability to participate in decision making will improve, Ability to verbalize feelings will improve, Ability to disclose and discuss suicidal ideas, Ability to identify and develop effective coping behaviors will improve and Compliance with prescribed medications will improve  Medication  Management: RN will administer medications as ordered by provider, will assess and evaluate patient's response and provide education to patient for prescribed medication. RN will report any adverse and/or side effects to prescribing provider.  Therapeutic Interventions: 1 on 1 counseling sessions, Psychoeducation, Medication administration, Evaluate responses to treatment, Monitor vital signs and CBGs as ordered, Perform/monitor CIWA, COWS, AIMS and Fall Risk screenings as ordered, Perform wound care treatments as ordered.  Evaluation of Outcomes: Progressing   LCSW Treatment Plan for Primary Diagnosis: MDD (major depressive disorder), recurrent, severe, with psychosis (HCC) Long Term Goal(s): Safe transition to appropriate next level of care at discharge, Engage patient in therapeutic group addressing interpersonal concerns.  Short Term Goals: Engage patient in aftercare planning with referrals and resources, Increase social support, Increase ability to appropriately verbalize feelings, Increase emotional regulation, Facilitate acceptance of mental health diagnosis and concerns, Facilitate patient progression through stages of change regarding substance use diagnoses and concerns, Identify triggers associated with mental health/substance abuse issues and Increase skills for wellness and recovery  Therapeutic Interventions: Assess for all discharge needs, 1 to 1 time with Social worker, Explore available resources and support systems, Assess for adequacy in community support network, Educate family and significant other(s) on suicide prevention, Complete Psychosocial Assessment, Interpersonal group therapy.  Evaluation of Outcomes: Progressing   Progress in Treatment: Attending groups: No. Participating in groups: No. Taking medication as prescribed: Yes. Toleration medication: Yes. Family/Significant other contact made: Yes, individual(s) contacted:  completed with patient Patient understands  diagnosis: Yes. Discussing patient identified problems/goals with staff: Yes. Medical problems stabilized or resolved: Yes. Denies suicidal/homicidal ideation: Yes. Issues/concerns per patient self-inventory: Yes. Other: none    New problem(s) identified: No, Describe:  No additional problems identified at treatment meeting.  New Short Term/Long Term Goal(s): detox, elimination of symptoms of psychosis, medication management for mood stabilization; elimination of SI thoughts; development of comprehensive mental wellness/sobriety plan. Update 05/21/20: None   Patient Goals:  "try to find a treatment place and some where for me and my son to stay at" Update 05/21/20: None   Discharge Plan or Barriers: None identified at this time.  Update 05/21/20: Referrals for inpatient substance use treatment sent out.   Reason for Continuation of Hospitalization: Anxiety Depression Medication stabilization Withdrawal symptoms  Estimated Length of Stay: 2-7 days    Attendees: Patient:  05/21/2020 8:55 AM  Physician:John Clapacs, MD 05/21/2020 8:55 AM  Nursing:   05/21/2020 8:55 AM  RN Care Manager: 05/21/2020 8:55 AM  Social Worker: Gwenevere Ghazi, MSW, [place], LCASA  05/21/2020 8:55 AM  Recreational Therapist:  05/21/2020 8:55 AM  Other: Janat Tabbert [place], LCSWA 05/21/2020 8:55 AM  Other: Jillyn Hidden, MSW, LCSW, LCAS  05/21/2020 8:55 AM  Other: 05/21/2020 8:55 AM    Scribe for Treatment Team: Sabien Umland A [place], LCSWA 05/21/2020 8:55 AM

## 2020-05-21 NOTE — Progress Notes (Signed)
Patient is calm and cooperative with assessment. She is alert and oriented x4. Affect is flat and depressed, but patient denies SI, HI, and AVH. Her main complaint is poor sleep, despite taking trazodone. Patient isolates to herself and her room, only coming out for meals. Patient denies medication side effects and remains medication compliant. Support and encouragement provided. Patient remains safe on the unit at this time and q15 min safety checks have been maintained.

## 2020-05-21 NOTE — BHH Group Notes (Signed)
LCSW Group Therapy Note  05/21/2020 2:37 PM  Type of Therapy/Topic:  Group Therapy:  Emotion Regulation  Participation Level:  None   Description of Group:   The purpose of this group is to assist patients in learning to regulate negative emotions and experience positive emotions. Patients will be guided to discuss ways in which they have been vulnerable to their negative emotions. These vulnerabilities will be juxtaposed with experiences of positive emotions or situations, and patients will be challenged to use positive emotions to combat negative ones. Special emphasis will be placed on coping with negative emotions in conflict situations, and patients will process healthy conflict resolution skills.  Therapeutic Goals: 1. Patient will identify two positive emotions or experiences to reflect on in order to balance out negative emotions 2. Patient will label two or more emotions that they find the most difficult to experience 3. Patient will demonstrate positive conflict resolution skills through discussion and/or role plays  Summary of Patient Progress: Patient was present for the entirety of group. She did not participate and did not appear to be attending to the conversation at hand.   Therapeutic Modalities:   Cognitive Behavioral Therapy Feelings Identification Dialectical Behavioral Therapy  Vilma Meckel. Algis Greenhouse, MSW, LCSW, LCAS 05/21/2020 2:37 PM

## 2020-05-21 NOTE — Plan of Care (Signed)
  Problem: Education: Goal: Emotional status will improve Outcome: Progressing Goal: Mental status will improve Outcome: Progressing   Problem: Activity: Goal: Interest or engagement in activities will improve Outcome: Progressing Goal: Sleeping patterns will improve Outcome: Progressing   Problem: Coping: Goal: Ability to verbalize frustrations and anger appropriately will improve Outcome: Progressing Goal: Ability to demonstrate self-control will improve Outcome: Progressing   Problem: Health Behavior/Discharge Planning: Goal: Compliance with treatment plan for underlying cause of condition will improve Outcome: Progressing   Problem: Safety: Goal: Periods of time without injury will increase Outcome: Progressing

## 2020-05-21 NOTE — Progress Notes (Signed)
Recreation Therapy Notes   Date: 05/21/2020  Time: 10:00 am   Location: Courtyard  Behavioral response: N/A   Intervention Topic: Leisure    Discussion/Intervention: Patient did not attend group.   Clinical Observations/Feedback:  Patient did not attend group.   Maxmillian Carsey LRT/CTRS        Chanele Douglas 05/21/2020 11:44 AM 

## 2020-05-22 NOTE — Plan of Care (Signed)
Patient presents at her baseline   Problem: Education: Goal: Emotional status will improve Outcome: Not Progressing Goal: Mental status will improve Outcome: Not Progressing   

## 2020-05-22 NOTE — Progress Notes (Signed)
Patient calm and cooperative during assessment denying SI/HI/AVH. Patient presents with a pleasant affect and was observed interacting appropriately with staff and peers on the unit. Patient compliant with medication administration per MD orders. Pt given education, support, and encouragement to be active in his treatment plan. Patient being monitored Q 15 minutes for safety per unit protocol. Pt remains safe on the unit. 

## 2020-05-22 NOTE — BHH Counselor (Signed)
CSW contacted [facility] regarding pt. referral. CSW was not able to reach anyone at the facility regarding referral. CSW will follow up with facility in a timely manner.  Rachel Nolan, MSW, LCSW-A 5/5/20224:12 PM

## 2020-05-22 NOTE — BHH Counselor (Signed)
CSW faxed referral information for pt to [facility] in [place], [place]. [address] [place], [place] [address] PHONE: [phone] FAX: [phone] CSW will follow up regarding referral to facility.   Alycen Mack [place], MSW, LCSW-A 5/5/202211:07 AM

## 2020-05-22 NOTE — Progress Notes (Signed)
Recreation Therapy Notes  Date: 05/22/2020  Time: 9:30 am   Location: Courtyard    Behavioral response: N/A   Intervention Topic: Animal Assisted therapy    Discussion/Intervention: Patient did not attend group.   Clinical Observations/Feedback:  Patient did not attend group.   Israel Werts LRT/CTRS        Dorman Calderwood 05/22/2020 11:44 AM

## 2020-05-22 NOTE — Progress Notes (Signed)
Patient is calm and cooperative with assessment. She denies SI, HI, and AVH. Affect is flat but brightens some on approach. Patient is appropriate with staff but remains isolative to her room and herself. Patient remains medication compliant. Support and encouragement provided. Patient remains safe on the unit at this time and q15 min safety checks have been maintained.

## 2020-05-22 NOTE — BHH Counselor (Signed)
CSW contacted the following regarding referrals with the following results:  [facility]: Pt has been approved but the have no female beds currently.   [facility]: No treatment beds available currently but may have some in another week as they are moving. Asked to resend referral information and this was done.   [facility]: Female detox beds are on hold due to some maintenance/construction issues. They does not have a female residential program.   [facility]: [facility] states that the referral application has changed and is sending new information over which will need to be completed and resent to his email ([email or web address])  Vilma Meckel. Algis Greenhouse, MSW, LCSW, LCAS 05/22/2020 10:59 AM

## 2020-05-22 NOTE — Progress Notes (Signed)
[facility] MD Progress Note  05/22/2020 6:06 PM Rachel Nolan  MRN:  [id number] Subjective: Follow-up for this 50 year old woman with depression and substance abuse.  Getting out of her room a little bit more but still stays pretty isolated.  Still endorses depression.  Passive suicidal thought without any active intent or plan.  Still passively working on getting into substance abuse rehab. Principal Problem: MDD (major depressive disorder), recurrent, severe, with psychosis (HCC) Diagnosis: Principal Problem:   MDD (major depressive disorder), recurrent, severe, with psychosis (HCC) Active Problems:   Post traumatic stress disorder (PTSD)   Cocaine use disorder (HCC)   Substance induced mood disorder (HCC)   Alcohol use disorder, severe, dependence (HCC)   Opioid use disorder, moderate, dependence (HCC)  Total Time spent with patient: 30 minutes  Past Psychiatric History: Past history of substance abuse with minimal past treatment  Past Medical History:  Past Medical History:  Diagnosis Date  . Anxiety   . Asthma   . Depression   . Hypertension   . Scoliosis     Past Surgical History:  Procedure Laterality Date  . BACK SURGERY    . ECTOPIC PREGNANCY SURGERY     Family History:  Family History  Problem Relation Age of Onset  . Hypertension Mother   . Hypertension Father    Family Psychiatric  History: See previous Social History:  Social History   Substance and Sexual Activity  Alcohol Use Yes     Social History   Substance and Sexual Activity  Drug Use Yes  . Types: Marijuana, Cocaine, "Crack" cocaine    Social History   Socioeconomic History  . Marital status: Widowed    Spouse name: Not on file  . Number of children: Not on file  . Years of education: Not on file  . Highest education level: Not on file  Occupational History  . Not on file  Tobacco Use  . Smoking status: Current Every Day Smoker    Packs/day: 1.00    Types: Cigarettes  . Smokeless  tobacco: Never Used  Vaping Use  . Vaping Use: Never used  Substance and Sexual Activity  . Alcohol use: Yes  . Drug use: Yes    Types: Marijuana, Cocaine, "Crack" cocaine  . Sexual activity: Not Currently  Other Topics Concern  . Not on file  Social History Narrative   Pt is homeless, no fixed address; not followed by an outpatient psychiatrist   Social Determinants of Health   Financial Resource Strain: Not on file  Food Insecurity: Not on file  Transportation Needs: Not on file  Physical Activity: Not on file  Stress: Not on file  Social Connections: Not on file   Additional Social History:                         Sleep: Fair  Appetite:  Fair  Current Medications: Current Facility-Administered Medications  Medication Dose Route Frequency Provider Last Rate Last Admin  . acetaminophen (TYLENOL) tablet 650 mg  650 mg Oral Q6H PRN Jesse Sans, MD      . alum & mag hydroxide-simeth (MAALOX/MYLANTA) 200-200-20 MG/5ML suspension 30 mL  30 mL Oral Q4H PRN Jesse Sans, MD      . amLODipine (NORVASC) tablet 10 mg  10 mg Oral Daily Jesse Sans, MD   10 mg at 05/22/20 0751  . dicyclomine (BENTYL) tablet 20 mg  20 mg Oral TID PRN Jesse Sans, MD      .  FLUoxetine (PROZAC) capsule 40 mg  40 mg Oral Daily Jesse Sans, MD   40 mg at 05/22/20 0751  . gabapentin (NEURONTIN) capsule 300 mg  300 mg Oral TID Jesse Sans, MD   300 mg at 05/22/20 1632  . loperamide (IMODIUM) capsule 2 mg  2 mg Oral Q4H PRN Jesse Sans, MD      . magnesium hydroxide (MILK OF MAGNESIA) suspension 30 mL  30 mL Oral Daily PRN Jesse Sans, MD      . methocarbamol (ROBAXIN) tablet 1,000 mg  1,000 mg Oral Q6H PRN Jesse Sans, MD   1,000 mg at 05/18/20 0759  . nicotine (NICODERM CQ - dosed in mg/24 hours) patch 21 mg  21 mg Transdermal Daily Jesse Sans, MD   21 mg at 05/22/20 0751  . ondansetron (ZOFRAN) tablet 4 mg  4 mg Oral Q8H PRN Jesse Sans, MD       . prazosin (MINIPRESS) capsule 3 mg  3 mg Oral QHS Jesse Sans, MD   3 mg at 05/21/20 2059  . QUEtiapine (SEROQUEL) tablet 400 mg  400 mg Oral QHS Jesse Sans, MD   400 mg at 05/21/20 2100  . traZODone (DESYREL) tablet 150 mg  150 mg Oral QHS Jesse Sans, MD   150 mg at 05/21/20 2059    Lab Results: No results found for this or any previous visit (from the past 48 hour(s)).  Blood Alcohol level:  Lab Results  Component Value Date   ETH <10 05/15/2020   ETH <10 04/21/2020    Metabolic Disorder Labs: Lab Results  Component Value Date   HGBA1C 5.5 01/24/2020   MPG 111.15 01/24/2020   MPG 105.41 12/07/2019   No results found for: PROLACTIN Lab Results  Component Value Date   CHOL 150 05/17/2020   TRIG 141 05/17/2020   HDL 58 05/17/2020   CHOLHDL 2.6 05/17/2020   VLDL 28 05/17/2020   LDLCALC 64 05/17/2020   LDLCALC 67 01/24/2020    Physical Findings: AIMS:  , ,  ,  ,    CIWA:    COWS:     Musculoskeletal: Strength & Muscle Tone: within normal limits Gait & Station: normal Patient leans: N/A  Psychiatric Specialty Exam:  Presentation  General Appearance: Disheveled  Eye Contact:Poor  Speech:Garbled  Speech Volume:Decreased  Handedness:Right   Mood and Affect  Mood:Depressed; Dysphoric  Affect:Congruent   Thought Process  Thought Processes:Goal Directed  Descriptions of Associations:Intact  Orientation:Full (Time, Place and Person)  Thought Content:Logical  History of Schizophrenia/Schizoaffective disorder:No  Duration of Psychotic Symptoms:Greater than six months  Hallucinations:No data recorded Ideas of Reference:None  Suicidal Thoughts:No data recorded Homicidal Thoughts:No data recorded  Sensorium  Memory:Immediate Fair; Recent Fair; Remote Fair  Judgment:Poor  Insight:Fair   Executive Functions  Concentration:Poor  Attention Span:Poor  Recall:Fair  Fund of Knowledge:Fair  Language:Fair   Psychomotor  Activity  Psychomotor Activity:No data recorded  Assets  Assets:Desire for Improvement; Social Support   Sleep  Sleep:No data recorded   Physical Exam: Physical Exam Vitals and nursing note reviewed.  Constitutional:      Appearance: Normal appearance.  HENT:     Head: Normocephalic and atraumatic.     Mouth/Throat:     Pharynx: Oropharynx is clear.  Eyes:     Pupils: Pupils are equal, round, and reactive to light.  Cardiovascular:     Rate and Rhythm: Normal rate and regular rhythm.  Pulmonary:  Effort: Pulmonary effort is normal.     Breath sounds: Normal breath sounds.  Abdominal:     General: Abdomen is flat.     Palpations: Abdomen is soft.  Musculoskeletal:        General: Normal range of motion.  Skin:    General: Skin is warm and dry.  Neurological:     General: No focal deficit present.     Mental Status: She is alert. Mental status is at baseline.  Psychiatric:        Attention and Perception: Attention normal.        Mood and Affect: Mood normal. Affect is blunt.        Speech: Speech is delayed.        Behavior: Behavior is cooperative.        Thought Content: Thought content normal.    Review of Systems  Constitutional: Negative.   HENT: Negative.   Eyes: Negative.   Respiratory: Negative.   Cardiovascular: Negative.   Gastrointestinal: Negative.   Musculoskeletal: Negative.   Skin: Negative.   Neurological: Negative.   Psychiatric/Behavioral: Positive for depression. Negative for suicidal ideas.   Blood pressure (!) 148/95, pulse 99, temperature 98.2 F (36.8 C), temperature source Oral, resp. rate 18, height 5\' 6"  (1.676 m), weight 89.4 kg, last menstrual period 06/19/2018, SpO2 98 %. Body mass index is 31.8 kg/m.   Treatment Plan Summary: Plan Continue antidepressant medicine.  Continue daily to urge patient to be more active and interact with others on the unit.  No change to medicine.  Hopefully we will be able to find some kind of  rehab placement  08/19/2018, MD 05/22/2020, 6:06 PM

## 2020-05-22 NOTE — [facility] Group Notes (Signed)
LCSW Group Therapy Note  05/22/2020 2:48 PM  Type of Therapy/Topic:  Group Therapy:  Balance in Life  Participation Level:  Did Not Attend  Description of Group:    This group will address the concept of balance and how it feels and looks when one is unbalanced. Patients will be encouraged to process areas in their lives that are out of balance and identify reasons for remaining unbalanced. Facilitators will guide patients in utilizing problem-solving interventions to address and correct the stressor making their life unbalanced. Understanding and applying boundaries will be explored and addressed for obtaining and maintaining a balanced life. Patients will be encouraged to explore ways to assertively make their unbalanced needs known to significant others in their lives, using other group members and facilitator for support and feedback.  Therapeutic Goals: 1. Patient will identify two or more emotions or situations they have that consume much of in their lives. 2. Patient will identify signs/triggers that life has become out of balance:  3. Patient will identify two ways to set boundaries in order to achieve balance in their lives:  4. Patient will demonstrate ability to communicate their needs through discussion and/or role plays  Summary of Patient Progress: Patient did not attend group despite encouraged participation.     Therapeutic Modalities:   Cognitive Behavioral Therapy Solution-Focused Therapy Assertiveness Training  Gwenevere Ghazi, MSW, [place], [place] 05/22/2020 2:48 PM

## 2020-05-23 NOTE — Progress Notes (Signed)
D: Pt alert and oriented. Pt denies experiencing any anxiety/depression at this time. Pt reports experiencing 8/10 left hip pain, PRN medication given and states being off that leg is helpful. Pt denies experiencing any SI/HI, or AVH at this time.   A: Scheduled medications administered to pt, per MD orders. Support and encouragement provided. Frequent verbal contact made. Routine safety checks conducted q15 minutes.   R: No adverse drug reactions noted. Pt verbally contracts for safety at this time. Pt complaint with medications and treatment plan. Pt interacts well with others on the unit, however mostly isolates to her room. Pt remains safe at this time. Will continue to monitor.

## 2020-05-23 NOTE — [facility] Group Notes (Signed)
LCSW Group Therapy Note  05/23/2020 2:10 PM  Type of Therapy and Topic:  Group Therapy:  Feelings around Relapse and Recovery  Participation Level:  Did Not Attend   Description of Group:    Patients in this group will discuss emotions they experience before and after a relapse. They will process how experiencing these feelings, or avoidance of experiencing them, relates to having a relapse. Facilitator will guide patients to explore emotions they have related to recovery. Patients will be encouraged to process which emotions are more powerful. They will be guided to discuss the emotional reaction significant others in their lives may have to their relapse or recovery. Patients will be assisted in exploring ways to respond to the emotions of others without this contributing to a relapse.  Therapeutic Goals: 1. Patient will identify two or more emotions that lead to a relapse for them 2. Patient will identify two emotions that result when they relapse 3. Patient will identify two emotions related to recovery 4. Patient will demonstrate ability to communicate their needs through discussion and/or role plays   Summary of Patient Progress: Patient did not attend group despite encouraged participation.    Therapeutic Modalities:   Cognitive Behavioral Therapy Solution-Focused Therapy Assertiveness Training Relapse Prevention Therapy   Gwenevere Ghazi, MSW, [place], [place] 05/23/2020 2:10 PM

## 2020-05-23 NOTE — [facility] Counselor (Signed)
CSW contacted the following regarding referrals with the following results:  [facility]: Pt has been approved but the have no female beds currently.    [facility], [place]: Staff member, Gabby [phone] ext: [phone]. She stated that they potentially  Have a couple of female beds available. She said to resend fax information to facility. CSW will send referral information to facility by end of day.      [facility]: CSW resent updated referral information to [facility], admissions director, at his email [email or web address]). CSW will follow up on receipt of application.    Rachel Nolan, MSW, LCSW-A 5/6/20223:52 PM

## 2020-05-23 NOTE — Progress Notes (Signed)
[facility] MD Progress Note  05/23/2020 5:58 PM Rachel Nolan  MRN:  [id number] Subjective: Follow-up patient with depression and substance abuse problems.  No new complaints.  Seems to be a little more active than she had been previously.  Still stays mostly to herself.  Progress seems to be getting made on getting her into a treatment facility Principal Problem: MDD (major depressive disorder), recurrent, severe, with psychosis (HCC) Diagnosis: Principal Problem:   MDD (major depressive disorder), recurrent, severe, with psychosis (HCC) Active Problems:   Post traumatic stress disorder (PTSD)   Cocaine use disorder (HCC)   Substance induced mood disorder (HCC)   Alcohol use disorder, severe, dependence (HCC)   Opioid use disorder, moderate, dependence (HCC)  Total Time spent with patient: 30 minutes  Past Psychiatric History: Past history of substance abuse depression minimal outpatient treatment  Past Medical History:  Past Medical History:  Diagnosis Date  . Anxiety   . Asthma   . Depression   . Hypertension   . Scoliosis     Past Surgical History:  Procedure Laterality Date  . BACK SURGERY    . ECTOPIC PREGNANCY SURGERY     Family History:  Family History  Problem Relation Age of Onset  . Hypertension Mother   . Hypertension Father    Family Psychiatric  History: See previous Social History:  Social History   Substance and Sexual Activity  Alcohol Use Yes     Social History   Substance and Sexual Activity  Drug Use Yes  . Types: Marijuana, Cocaine, "Crack" cocaine    Social History   Socioeconomic History  . Marital status: Widowed    Spouse name: Not on file  . Number of children: Not on file  . Years of education: Not on file  . Highest education level: Not on file  Occupational History  . Not on file  Tobacco Use  . Smoking status: Current Every Day Smoker    Packs/day: 1.00    Types: Cigarettes  . Smokeless tobacco: Never Used  Vaping Use  .  Vaping Use: Never used  Substance and Sexual Activity  . Alcohol use: Yes  . Drug use: Yes    Types: Marijuana, Cocaine, "Crack" cocaine  . Sexual activity: Not Currently  Other Topics Concern  . Not on file  Social History Narrative   Pt is homeless, no fixed address; not followed by an outpatient psychiatrist   Social Determinants of Health   Financial Resource Strain: Not on file  Food Insecurity: Not on file  Transportation Needs: Not on file  Physical Activity: Not on file  Stress: Not on file  Social Connections: Not on file   Additional Social History:                         Sleep: Fair  Appetite:  Fair  Current Medications: Current Facility-Administered Medications  Medication Dose Route Frequency Provider Last Rate Last Admin  . acetaminophen (TYLENOL) tablet 650 mg  650 mg Oral Q6H PRN Jesse Sans, MD      . alum & mag hydroxide-simeth (MAALOX/MYLANTA) 200-200-20 MG/5ML suspension 30 mL  30 mL Oral Q4H PRN Jesse Sans, MD      . amLODipine (NORVASC) tablet 10 mg  10 mg Oral Daily Jesse Sans, MD   10 mg at 05/23/20 0757  . dicyclomine (BENTYL) tablet 20 mg  20 mg Oral TID PRN Jesse Sans, MD      .  FLUoxetine (PROZAC) capsule 40 mg  40 mg Oral Daily Jesse Sans, MD   40 mg at 05/23/20 0757  . gabapentin (NEURONTIN) capsule 300 mg  300 mg Oral TID Jesse Sans, MD   300 mg at 05/23/20 1701  . loperamide (IMODIUM) capsule 2 mg  2 mg Oral Q4H PRN Jesse Sans, MD      . magnesium hydroxide (MILK OF MAGNESIA) suspension 30 mL  30 mL Oral Daily PRN Jesse Sans, MD      . methocarbamol (ROBAXIN) tablet 1,000 mg  1,000 mg Oral Q6H PRN Jesse Sans, MD   1,000 mg at 05/23/20 0759  . nicotine (NICODERM CQ - dosed in mg/24 hours) patch 21 mg  21 mg Transdermal Daily Jesse Sans, MD   21 mg at 05/23/20 0759  . ondansetron (ZOFRAN) tablet 4 mg  4 mg Oral Q8H PRN Jesse Sans, MD      . prazosin (MINIPRESS) capsule 3  mg  3 mg Oral QHS Jesse Sans, MD   3 mg at 05/22/20 2055  . QUEtiapine (SEROQUEL) tablet 400 mg  400 mg Oral QHS Jesse Sans, MD   400 mg at 05/22/20 2055  . traZODone (DESYREL) tablet 150 mg  150 mg Oral QHS Jesse Sans, MD   150 mg at 05/22/20 2055    Lab Results: No results found for this or any previous visit (from the past 48 hour(s)).  Blood Alcohol level:  Lab Results  Component Value Date   ETH <10 05/15/2020   ETH <10 04/21/2020    Metabolic Disorder Labs: Lab Results  Component Value Date   HGBA1C 5.5 01/24/2020   MPG 111.15 01/24/2020   MPG 105.41 12/07/2019   No results found for: PROLACTIN Lab Results  Component Value Date   CHOL 150 05/17/2020   TRIG 141 05/17/2020   HDL 58 05/17/2020   CHOLHDL 2.6 05/17/2020   VLDL 28 05/17/2020   LDLCALC 64 05/17/2020   LDLCALC 67 01/24/2020    Physical Findings: AIMS:  , ,  ,  ,    CIWA:    COWS:     Musculoskeletal: Strength & Muscle Tone: within normal limits Gait & Station: normal Patient leans: N/A  Psychiatric Specialty Exam:  Presentation  General Appearance: Disheveled  Eye Contact:Poor  Speech:Garbled  Speech Volume:Decreased  Handedness:Right   Mood and Affect  Mood:Depressed; Dysphoric  Affect:Congruent   Thought Process  Thought Processes:Goal Directed  Descriptions of Associations:Intact  Orientation:Full (Time, Place and Person)  Thought Content:Logical  History of Schizophrenia/Schizoaffective disorder:No  Duration of Psychotic Symptoms:Greater than six months  Hallucinations:No data recorded Ideas of Reference:None  Suicidal Thoughts:No data recorded Homicidal Thoughts:No data recorded  Sensorium  Memory:Immediate Fair; Recent Fair; Remote Fair  Judgment:Poor  Insight:Fair   Executive Functions  Concentration:Poor  Attention Span:Poor  Recall:Fair  Fund of Knowledge:Fair  Language:Fair   Psychomotor Activity  Psychomotor Activity:No  data recorded  Assets  Assets:Desire for Improvement; Social Support   Sleep  Sleep:No data recorded   Physical Exam: Physical Exam Constitutional:      Appearance: Normal appearance.  HENT:     Head: Normocephalic and atraumatic.     Mouth/Throat:     Pharynx: Oropharynx is clear.  Eyes:     Pupils: Pupils are equal, round, and reactive to light.  Cardiovascular:     Rate and Rhythm: Normal rate and regular rhythm.  Pulmonary:     Effort: Pulmonary effort is  normal.     Breath sounds: Normal breath sounds.  Abdominal:     General: Abdomen is flat.     Palpations: Abdomen is soft.  Musculoskeletal:        General: Normal range of motion.  Skin:    General: Skin is warm and dry.  Neurological:     General: No focal deficit present.     Mental Status: She is alert. Mental status is at baseline.  Psychiatric:        Mood and Affect: Mood normal.        Thought Content: Thought content normal.    Review of Systems  Constitutional: Negative.   HENT: Negative.   Eyes: Negative.   Respiratory: Negative.   Cardiovascular: Negative.   Gastrointestinal: Negative.   Musculoskeletal: Negative.   Skin: Negative.   Neurological: Negative.   Psychiatric/Behavioral: Negative.    Blood pressure 127/89, pulse (!) 107, temperature 98.2 F (36.8 C), temperature source Oral, resp. rate 17, height 5\' 6"  (1.676 m), weight 89.4 kg, last menstrual period 06/19/2018, SpO2 96 %. Body mass index is 31.8 kg/m.   Treatment Plan Summary: Plan No change to medication or plan.  Social work is making an effort to find her a bed at a substance abuse treatment facility.  We will follow up daily.  08/19/2018, MD 05/23/2020, 5:58 PM

## 2020-05-24 DIAGNOSIS — F333 Major depressive disorder, recurrent, severe with psychotic symptoms: Secondary | ICD-10-CM | POA: Diagnosis not present

## 2020-05-24 NOTE — [facility] Counselor (Signed)
CSW met with pt briefly to check-in. She stated that she was fine. She denied any concerns or needs. CSW stated that he would follow up on the referrals that were made. She agreed. No concerns expressed. Contact ended without incident.   CSW contacted the following regarding referrals:  [facility]: Pt remains on waitlist. Bed availability depends on their discharges. She is approximately 13th on the list.   [facility]: HIPPA compliant voicemail left with contact information for follow up.   [facility]: HIPPA compliant voicemail left with contact information for follow up.    Chalmers Guest. Guerry Bruin, MSW, [place], [place] 05/24/2020 12:24 PM

## 2020-05-24 NOTE — Progress Notes (Signed)
Patient is pleasant and cooperative. Denies SI, HI and AVH. Mood is pleasant. Affect seems brighter.

## 2020-05-24 NOTE — Progress Notes (Signed)
[facility] MD Progress Note  05/24/2020 10:51 AM Rachel Nolan  MRN:  [id number]  Principal Problem: MDD (major depressive disorder), recurrent, severe, with psychosis (HCC) Diagnosis: Principal Problem:   MDD (major depressive disorder), recurrent, severe, with psychosis (HCC) Active Problems:   Post traumatic stress disorder (PTSD)   Cocaine use disorder (HCC)   Substance induced mood disorder (HCC)   Alcohol use disorder, severe, dependence (HCC)   Opioid use disorder, moderate, dependence (HCC)  Rachel Nolan is a 50y.o. female with major depressive disorder, PTSD, and substance use disorder, presenting for worsening mood, suicidal ideations, and auditory hallucinations in settings on relapsing on drugs (cocaine and heroin).   Interval History Patient was seen today for re-evaluation.  Nursing reports no events overnight. The patient has no issues with performing ADLs.  Patient has been medication compliant.    Subjective:  On assessment patient reports "I feel okay. My blood pressure is a little up, I am fine besides that". She reports feeling moderately-depressed "not worse". She reports vague passive death wishes "I think about that", denies active suicidal thoughts, plans, or homicidal thoughts. She reports improved sleep, no nightmares. She denies auditory/visual hallucinations today. She continues to express her interest in residential substance-abuse treatment as she is unable to stay off drugs on her own.  Labs: no new results for review.  Total Time spent with patient: 20 minutes  Past Psychiatric History: see H&P  Past Medical History:  Past Medical History:  Diagnosis Date  . Anxiety   . Asthma   . Depression   . Hypertension   . Scoliosis     Past Surgical History:  Procedure Laterality Date  . BACK SURGERY    . ECTOPIC PREGNANCY SURGERY     Family History:  Family History  Problem Relation Age of Onset  . Hypertension Mother   . Hypertension Father    Family  Psychiatric  History: see H&P Social History:  Social History   Substance and Sexual Activity  Alcohol Use Yes     Social History   Substance and Sexual Activity  Drug Use Yes  . Types: Marijuana, Cocaine, "Crack" cocaine    Social History   Socioeconomic History  . Marital status: Widowed    Spouse name: Not on file  . Number of children: Not on file  . Years of education: Not on file  . Highest education level: Not on file  Occupational History  . Not on file  Tobacco Use  . Smoking status: Current Every Day Smoker    Packs/day: 1.00    Types: Cigarettes  . Smokeless tobacco: Never Used  Vaping Use  . Vaping Use: Never used  Substance and Sexual Activity  . Alcohol use: Yes  . Drug use: Yes    Types: Marijuana, Cocaine, "Crack" cocaine  . Sexual activity: Not Currently  Other Topics Concern  . Not on file  Social History Narrative   Pt is homeless, no fixed address; not followed by an outpatient psychiatrist   Social Determinants of Health   Financial Resource Strain: Not on file  Food Insecurity: Not on file  Transportation Needs: Not on file  Physical Activity: Not on file  Stress: Not on file  Social Connections: Not on file   Additional Social History:                         Sleep: Fair  Appetite:  Good  Current Medications: Current Facility-Administered Medications  Medication Dose Route  Frequency Provider Last Rate Last Admin  . acetaminophen (TYLENOL) tablet 650 mg  650 mg Oral Q6H PRN Jesse Sans, MD      . alum & mag hydroxide-simeth (MAALOX/MYLANTA) 200-200-20 MG/5ML suspension 30 mL  30 mL Oral Q4H PRN Jesse Sans, MD      . amLODipine (NORVASC) tablet 10 mg  10 mg Oral Daily Jesse Sans, MD   10 mg at 05/24/20 0818  . dicyclomine (BENTYL) tablet 20 mg  20 mg Oral TID PRN Jesse Sans, MD      . FLUoxetine (PROZAC) capsule 40 mg  40 mg Oral Daily Jesse Sans, MD   40 mg at 05/24/20 0818  . gabapentin  (NEURONTIN) capsule 300 mg  300 mg Oral TID Jesse Sans, MD   300 mg at 05/24/20 0818  . loperamide (IMODIUM) capsule 2 mg  2 mg Oral Q4H PRN Jesse Sans, MD      . magnesium hydroxide (MILK OF MAGNESIA) suspension 30 mL  30 mL Oral Daily PRN Jesse Sans, MD      . methocarbamol (ROBAXIN) tablet 1,000 mg  1,000 mg Oral Q6H PRN Jesse Sans, MD   1,000 mg at 05/23/20 0759  . nicotine (NICODERM CQ - dosed in mg/24 hours) patch 21 mg  21 mg Transdermal Daily Jesse Sans, MD   21 mg at 05/24/20 0819  . ondansetron (ZOFRAN) tablet 4 mg  4 mg Oral Q8H PRN Jesse Sans, MD      . prazosin (MINIPRESS) capsule 3 mg  3 mg Oral QHS Jesse Sans, MD   3 mg at 05/23/20 2111  . QUEtiapine (SEROQUEL) tablet 400 mg  400 mg Oral QHS Jesse Sans, MD   400 mg at 05/23/20 2111  . traZODone (DESYREL) tablet 150 mg  150 mg Oral QHS Jesse Sans, MD   150 mg at 05/23/20 2206    Lab Results:  No results found for this or any previous visit (from the past 48 hour(s)).  Blood Alcohol level:  Lab Results  Component Value Date   ETH <10 05/15/2020   ETH <10 04/21/2020    Metabolic Disorder Labs: Lab Results  Component Value Date   HGBA1C 5.5 01/24/2020   MPG 111.15 01/24/2020   MPG 105.41 12/07/2019   No results found for: PROLACTIN Lab Results  Component Value Date   CHOL 150 05/17/2020   TRIG 141 05/17/2020   HDL 58 05/17/2020   CHOLHDL 2.6 05/17/2020   VLDL 28 05/17/2020   LDLCALC 64 05/17/2020   LDLCALC 67 01/24/2020    Physical Findings: AIMS:  , ,  ,  ,    CIWA:    COWS:     Musculoskeletal: Strength & Muscle Tone: within normal limits Gait & Station: normal Patient leans: N/A   Physical Exam: Physical Exam Vitals reviewed.    ROS  Blood pressure (!) 139/97, pulse 97, temperature 98.2 F (36.8 C), temperature source Oral, resp. rate 17, height 5\' 6"  (1.676 m), weight 89.4 kg, last menstrual period 06/19/2018, SpO2 97 %. Body mass index is  31.8 kg/m.   Psychiatric Specialty Exam:  Presentation  General Appearance: Disheveled  Eye Contact: fair  Speech:Normal Rate  Speech Volume:Normal  Handedness:Right   Mood and Affect  Mood:Depressed; "so-so"  Affect:Congruent; Constricted   Thought Process  Thought Processes:Coherent  Descriptions of Associations:Intact  Orientation:Full (Time, Place and Person)  Thought Content:Perseveration  History of Schizophrenia/Schizoaffective disorder:No  Duration of Psychotic Symptoms:Less than six months  Hallucinations: denies today  Ideas of Reference:None  Suicidal Thoughts: Yes, without plan.  Homicidal Thoughts:Homicidal Thoughts: No   Sensorium  Memory:Immediate Fair  Judgment:Fair  Insight:Fair   Executive Functions  Concentration:Fair  Attention Span:Fair  Recall:Fair  Fund of Knowledge:Fair  Language:Fair   Psychomotor Activity  Psychomotor Activity:Psychomotor Activity: Decreased   Assets  Assets:Communication Skills; Desire for Improvement   Sleep  Sleep:Sleep: Fair    Treatment Plan Summary: Daily contact with patient to assess and evaluate symptoms and progress in treatment and Medication management  Patient is a 50 year old female with the above-stated past psychiatric history who is seen in follow-up.  Chart reviewed. Patient discussed with nursing. Patient reports partial mood and sleep improvement today. No medication changes made today.   Plan:  -continue inpatient psych admission; 15-minute checks; daily contact with patient to assess and evaluate symptoms and progress in treatment; psychoeducation.  -continue scheduled medications: 1)MDD, recurrent, severe with psychotic features- established problem, unstable  - Continue Seroquel 400 mg nightly for mood and hallucinations, fluoxetine 40 mg daily, trazodone 150 mg QHS for insomnia  2) PTSD, chronic- established problem,  unstable  - Prozac as above, continue prazosin 3 mg QHS for nightmares  3) HTN- established problem  - Amlodipine 10 mg daily  4) Neuropathic pain  - Continue gabapentin 300 mg TID   . amLODipine  10 mg Oral Daily  . FLUoxetine  40 mg Oral Daily  . gabapentin  300 mg Oral TID  . nicotine  21 mg Transdermal Daily  . prazosin  3 mg Oral QHS  . QUEtiapine  400 mg Oral QHS  . traZODone  150 mg Oral QHS    -continue PRN medications.  acetaminophen, alum & mag hydroxide-simeth, dicyclomine, loperamide, magnesium hydroxide, methocarbamol, ondansetron  -Pertinent Labs: no new labs ordered today    -Consults: No new consults placed since yesterday    -Disposition: All necessary aftercare will be arranged prior to discharge. Patient is interested in residential SUD treatment.  -  I certify that the patient does need, on a daily basis, active treatment furnished directly by or requiring the supervision of inpatient psychiatric facility personnel.   Thalia Party, MD 05/24/2020, 10:51 AM

## 2020-05-24 NOTE — [facility] Group Notes (Signed)
LCSW Group Therapy Note   05/24/2020 11:00 AM   Type of Therapy and Topic:  Group Therapy: Avoiding Self-Sabotaging and Enabling Behaviors  Participation Level:  Did Not Attend  Description of Group:  In this group, patients will learn how to identify obstacles, self-sabotaging and enabling behaviors, as well as: what are they, why do we do them and what needs these behaviors meet. Discuss unhealthy relationships and how to have positive healthy boundaries with those that sabotage and enable. Explore aspects of self-sabotage and enabling in yourself and how to limit these self-destructive behaviors in everyday life.   Therapeutic Goals: 1.  Patient will identify one obstacle that relates to self-sabotage and enabling behaviors 2.  Patient will identify one personal self-sabotaging or enabling behavior they did prior to admission 3.  Patient will state a plan to change the above identified behavior 4.  Patient will demonstrate ability to communicate their needs through discussion and/or role play.  Summary of Patient Progress: X  Therapeutic Modalities:  Cognitive Behavioral Therapy Person-Centered Therapy Motivational Interviewing   Kristen Fromm R. Lance Huar[place], MSW, LCSW, LCAS 05/24/2020 11:00 AM  

## 2020-05-24 NOTE — Plan of Care (Signed)
°  Problem: Education: °Goal: Knowledge of [facility] Education information/materials will improve °Outcome: Progressing °Goal: Emotional status will improve °Outcome: Progressing °Goal: Mental status will improve °Outcome: Progressing °Goal: Verbalization of understanding the information provided will improve °Outcome: Progressing °  °Problem: Activity: °Goal: Interest or engagement in activities will improve °Outcome: Progressing °Goal: Sleeping patterns will improve °Outcome: Progressing °  °Problem: Coping: °Goal: Ability to verbalize frustrations and anger appropriately will improve °Outcome: Progressing °Goal: Ability to demonstrate self-control will improve °Outcome: Progressing °  °Problem: Health Behavior/Discharge Planning: °Goal: Identification of resources available to assist in meeting health care needs will improve °Outcome: Progressing °Goal: Compliance with treatment plan for underlying cause of condition will improve °Outcome: Progressing °  °Problem: Physical Regulation: °Goal: Ability to maintain clinical measurements within normal limits will improve °Outcome: Progressing °  °Problem: Safety: °Goal: Periods of time without injury will increase °Outcome: Progressing °  °Problem: Education: °Goal: Knowledge of disease or condition will improve °Outcome: Progressing °Goal: Understanding of discharge needs will improve °Outcome: Progressing °  °Problem: Health Behavior/Discharge Planning: °Goal: Ability to identify changes in lifestyle to reduce recurrence of condition will improve °Outcome: Progressing °Goal: Identification of resources available to assist in meeting health care needs will improve °Outcome: Progressing °  °Problem: Physical Regulation: °Goal: Complications related to the disease process, condition or treatment will be avoided or minimized °Outcome: Progressing °  °Problem: Safety: °Goal: Ability to remain free from injury will improve °Outcome: Progressing °  °Problem:  Education: °Goal: Ability to make informed decisions regarding treatment will improve °Outcome: Progressing °  °Problem: Coping: °Goal: Coping ability will improve °Outcome: Progressing °  °Problem: Health Behavior/Discharge Planning: °Goal: Identification of resources available to assist in meeting health care needs will improve °Outcome: Progressing °  °Problem: Medication: °Goal: Compliance with prescribed medication regimen will improve °Outcome: Progressing °  °Problem: Self-Concept: °Goal: Ability to disclose and discuss suicidal ideas will improve °Outcome: Progressing °Goal: Will verbalize positive feelings about self °Outcome: Progressing °  °

## 2020-05-24 NOTE — Plan of Care (Signed)
Has stayed in bed reporting that she needs more rest. Reports that she did not sleep well last night. Denies SI/HI/AVH. Appetite is fair. Has no additional concerns. Was encouraged to be out of bed as tolerated  and to get involved in activities to prepare for better sleep tonight. Safety precautions maintained.

## 2020-05-25 DIAGNOSIS — F333 Major depressive disorder, recurrent, severe with psychotic symptoms: Secondary | ICD-10-CM | POA: Diagnosis not present

## 2020-05-25 NOTE — Progress Notes (Signed)
Patient has been a little more irritable this shift. Complaining of the temperature of her room. Temperature adjusted to make her room cooler. Denies SI, HI and AVH

## 2020-05-25 NOTE — Plan of Care (Signed)
°  Problem: Education: °Goal: Knowledge of [facility] Education information/materials will improve °Outcome: Progressing °Goal: Emotional status will improve °Outcome: Progressing °Goal: Mental status will improve °Outcome: Progressing °Goal: Verbalization of understanding the information provided will improve °Outcome: Progressing °  °Problem: Activity: °Goal: Interest or engagement in activities will improve °Outcome: Progressing °Goal: Sleeping patterns will improve °Outcome: Progressing °  °Problem: Coping: °Goal: Ability to verbalize frustrations and anger appropriately will improve °Outcome: Progressing °Goal: Ability to demonstrate self-control will improve °Outcome: Progressing °  °Problem: Health Behavior/Discharge Planning: °Goal: Identification of resources available to assist in meeting health care needs will improve °Outcome: Progressing °Goal: Compliance with treatment plan for underlying cause of condition will improve °Outcome: Progressing °  °Problem: Physical Regulation: °Goal: Ability to maintain clinical measurements within normal limits will improve °Outcome: Progressing °  °Problem: Safety: °Goal: Periods of time without injury will increase °Outcome: Progressing °  °Problem: Education: °Goal: Knowledge of disease or condition will improve °Outcome: Progressing °Goal: Understanding of discharge needs will improve °Outcome: Progressing °  °Problem: Health Behavior/Discharge Planning: °Goal: Ability to identify changes in lifestyle to reduce recurrence of condition will improve °Outcome: Progressing °Goal: Identification of resources available to assist in meeting health care needs will improve °Outcome: Progressing °  °Problem: Physical Regulation: °Goal: Complications related to the disease process, condition or treatment will be avoided or minimized °Outcome: Progressing °  °Problem: Safety: °Goal: Ability to remain free from injury will improve °Outcome: Progressing °  °Problem:  Education: °Goal: Ability to make informed decisions regarding treatment will improve °Outcome: Progressing °  °Problem: Coping: °Goal: Coping ability will improve °Outcome: Progressing °  °Problem: Health Behavior/Discharge Planning: °Goal: Identification of resources available to assist in meeting health care needs will improve °Outcome: Progressing °  °Problem: Medication: °Goal: Compliance with prescribed medication regimen will improve °Outcome: Progressing °  °Problem: Self-Concept: °Goal: Ability to disclose and discuss suicidal ideas will improve °Outcome: Progressing °Goal: Will verbalize positive feelings about self °Outcome: Progressing °  °

## 2020-05-25 NOTE — Progress Notes (Signed)
Patient is calm and cooperative with assessment. Affect is flat and she appears more depressed compared to previous encounters with patient. She denies SI, HI, and AVH. She also denies pain, medication side effects, and any other physical problem. Patient remains medication compliant. She is isolative to her room except for meals. Patient remains safe on the unit at this time and q15 min safety checks are maintained.

## 2020-05-25 NOTE — Progress Notes (Signed)
[facility] MD Progress Note  05/25/2020 12:18 PM Rachel Nolan  MRN:  [id number]  Principal Problem: MDD (major depressive disorder), recurrent, severe, with psychosis (HCC) Diagnosis: Principal Problem:   MDD (major depressive disorder), recurrent, severe, with psychosis (HCC) Active Problems:   Post traumatic stress disorder (PTSD)   Cocaine use disorder (HCC)   Substance induced mood disorder (HCC)   Alcohol use disorder, severe, dependence (HCC)   Opioid use disorder, moderate, dependence (HCC)  RachelNolan is a 50y.o. female with major depressive disorder, PTSD, and substance use disorder, presenting for worsening mood, suicidal ideations, and auditory hallucinations in settings on relapsing on drugs (cocaine and heroin).   Interval History Patient was seen today for re-evaluation.  Nursing reports no events overnight. The patient has no issues with performing ADLs.  Patient has been medication compliant.    Subjective:  On assessment patient reports "I feel so-so". She reports feeling moderately-depressed "not worse". Continues to reports vague passive death wishes "I think about that", denies active suicidal thoughts, plans. Denies homicidal thoughts. She reports 'okay" sleep, no nightmares. She denies auditory/visual hallucinations today. She continues to express her interest in residential substance-abuse treatment as she is unable to stay off drugs on her own.  Labs: no new results for review.  Total Time spent with patient: 20 minutes  Past Psychiatric History: see H&P  Past Medical History:  Past Medical History:  Diagnosis Date  . Anxiety   . Asthma   . Depression   . Hypertension   . Scoliosis     Past Surgical History:  Procedure Laterality Date  . BACK SURGERY    . ECTOPIC PREGNANCY SURGERY     Family History:  Family History  Problem Relation Age of Onset  . Hypertension Mother   . Hypertension Father    Family Psychiatric  History: see H&P Social History:   Social History   Substance and Sexual Activity  Alcohol Use Yes     Social History   Substance and Sexual Activity  Drug Use Yes  . Types: Marijuana, Cocaine, "Crack" cocaine    Social History   Socioeconomic History  . Marital status: Widowed    Spouse name: Not on file  . Number of children: Not on file  . Years of education: Not on file  . Highest education level: Not on file  Occupational History  . Not on file  Tobacco Use  . Smoking status: Current Every Day Smoker    Packs/day: 1.00    Types: Cigarettes  . Smokeless tobacco: Never Used  Vaping Use  . Vaping Use: Never used  Substance and Sexual Activity  . Alcohol use: Yes  . Drug use: Yes    Types: Marijuana, Cocaine, "Crack" cocaine  . Sexual activity: Not Currently  Other Topics Concern  . Not on file  Social History Narrative   Pt is homeless, no fixed address; not followed by an outpatient psychiatrist   Social Determinants of Health   Financial Resource Strain: Not on file  Food Insecurity: Not on file  Transportation Needs: Not on file  Physical Activity: Not on file  Stress: Not on file  Social Connections: Not on file   Additional Social History:                         Sleep: Fair  Appetite:  Good  Current Medications: Current Facility-Administered Medications  Medication Dose Route Frequency Provider Last Rate Last Admin  . acetaminophen (TYLENOL) tablet  650 mg  650 mg Oral Q6H PRN Jesse Sans, MD      . alum & mag hydroxide-simeth (MAALOX/MYLANTA) 200-200-20 MG/5ML suspension 30 mL  30 mL Oral Q4H PRN Jesse Sans, MD      . amLODipine (NORVASC) tablet 10 mg  10 mg Oral Daily Jesse Sans, MD   10 mg at 05/25/20 0759  . dicyclomine (BENTYL) tablet 20 mg  20 mg Oral TID PRN Jesse Sans, MD      . FLUoxetine (PROZAC) capsule 40 mg  40 mg Oral Daily Jesse Sans, MD   40 mg at 05/25/20 0759  . gabapentin (NEURONTIN) capsule 300 mg  300 mg Oral TID Jesse Sans, MD   300 mg at 05/25/20 1145  . loperamide (IMODIUM) capsule 2 mg  2 mg Oral Q4H PRN Jesse Sans, MD      . magnesium hydroxide (MILK OF MAGNESIA) suspension 30 mL  30 mL Oral Daily PRN Jesse Sans, MD      . methocarbamol (ROBAXIN) tablet 1,000 mg  1,000 mg Oral Q6H PRN Jesse Sans, MD   1,000 mg at 05/23/20 0759  . nicotine (NICODERM CQ - dosed in mg/24 hours) patch 21 mg  21 mg Transdermal Daily Jesse Sans, MD   21 mg at 05/25/20 0800  . ondansetron (ZOFRAN) tablet 4 mg  4 mg Oral Q8H PRN Jesse Sans, MD      . prazosin (MINIPRESS) capsule 3 mg  3 mg Oral QHS Jesse Sans, MD   3 mg at 05/24/20 2117  . QUEtiapine (SEROQUEL) tablet 400 mg  400 mg Oral QHS Jesse Sans, MD   400 mg at 05/24/20 2117  . traZODone (DESYREL) tablet 150 mg  150 mg Oral QHS Jesse Sans, MD   150 mg at 05/24/20 2118    Lab Results:  No results found for this or any previous visit (from the past 48 hour(s)).  Blood Alcohol level:  Lab Results  Component Value Date   ETH <10 05/15/2020   ETH <10 04/21/2020    Metabolic Disorder Labs: Lab Results  Component Value Date   HGBA1C 5.5 01/24/2020   MPG 111.15 01/24/2020   MPG 105.41 12/07/2019   No results found for: PROLACTIN Lab Results  Component Value Date   CHOL 150 05/17/2020   TRIG 141 05/17/2020   HDL 58 05/17/2020   CHOLHDL 2.6 05/17/2020   VLDL 28 05/17/2020   LDLCALC 64 05/17/2020   LDLCALC 67 01/24/2020    Physical Findings: AIMS:  , ,  ,  ,    CIWA:    COWS:     Musculoskeletal: Strength & Muscle Tone: within normal limits Gait & Station: normal Patient leans: N/A   Physical Exam: Physical Exam Vitals reviewed.    ROS  Blood pressure 135/89, pulse 80, temperature 97.9 F (36.6 C), temperature source Oral, resp. rate 18, height 5\' 6"  (1.676 m), weight 89.4 kg, last menstrual period 06/19/2018, SpO2 95 %. Body mass index is 31.8 kg/m.   Psychiatric Specialty  Exam:  Presentation  General Appearance: Disheveled  Eye Contact: fair  Speech:Normal Rate  Speech Volume:Normal  Handedness:Right   Mood and Affect  Mood:Depressed; "so-so"  Affect:Congruent; Constricted   Thought Process  Thought Processes:Coherent  Descriptions of Associations:Intact  Orientation:Full (Time, Place and Person)  Thought Content:Perseveration  History of Schizophrenia/Schizoaffective disorder:No  Duration of Psychotic Symptoms:Less than six months  Hallucinations: denies today  Ideas of Reference:None  Suicidal Thoughts: Yes, without plan.  Homicidal Thoughts:Homicidal Thoughts: No   Sensorium  Memory:Immediate Fair  Judgment:Fair  Insight:Fair   Executive Functions  Concentration:Fair  Attention Span:Fair  Recall:Fair  Fund of Knowledge:Fair  Language:Fair   Psychomotor Activity  Psychomotor Activity:Psychomotor Activity: Decreased   Assets  Assets:Communication Skills; Desire for Improvement   Sleep  Sleep:Sleep: Fair    Treatment Plan Summary: Daily contact with patient to assess and evaluate symptoms and progress in treatment and Medication management  Patient is a 50 year old female with the above-stated past psychiatric history who is seen in follow-up.  Chart reviewed. Patient discussed with nursing. Patient reports partial mood and sleep improvement today. No medication changes made today. SW is working on getting her into a SUD treatment facility    Plan:  -continue inpatient psych admission; 15-minute checks; daily contact with patient to assess and evaluate symptoms and progress in treatment; psychoeducation.  -continue scheduled medications: 1)MDD, recurrent, severe with psychotic features- established problem, unstable  - Continue Seroquel 400 mg nightly for mood and hallucinations, fluoxetine 40 mg daily, trazodone 150 mg QHS for insomnia  2) PTSD, chronic-  established problem, unstable  - Prozac as above, continue prazosin 3 mg QHS for nightmares  3) HTN- established problem  - Amlodipine 10 mg daily  4) Neuropathic pain  - Continue gabapentin 300 mg TID   . amLODipine  10 mg Oral Daily  . FLUoxetine  40 mg Oral Daily  . gabapentin  300 mg Oral TID  . nicotine  21 mg Transdermal Daily  . prazosin  3 mg Oral QHS  . QUEtiapine  400 mg Oral QHS  . traZODone  150 mg Oral QHS    -continue PRN medications.  acetaminophen, alum & mag hydroxide-simeth, dicyclomine, loperamide, magnesium hydroxide, methocarbamol, ondansetron  -Pertinent Labs: no new labs ordered today    -Consults: No new consults placed since yesterday    -Disposition: All necessary aftercare will be arranged prior to discharge. Patient is interested in residential SUD treatment.  -  I certify that the patient does need, on a daily basis, active treatment furnished directly by or requiring the supervision of inpatient psychiatric facility personnel.   Thalia Party, MD 05/25/2020, 12:18 PM

## 2020-05-25 NOTE — Plan of Care (Signed)
°  Problem: Education: °Goal: Knowledge of  [facility] Education information/materials will improve °Outcome: Progressing °Goal: Emotional status will improve °Outcome: Progressing °Goal: Mental status will improve °Outcome: Progressing °Goal: Verbalization of understanding the information provided will improve °Outcome: Progressing °  °Problem: Activity: °Goal: Interest or engagement in activities will improve °Outcome: Progressing °Goal: Sleeping patterns will improve °Outcome: Progressing °  °Problem: Coping: °Goal: Ability to verbalize frustrations and anger appropriately will improve °Outcome: Progressing °Goal: Ability to demonstrate self-control will improve °Outcome: Progressing °  °Problem: Health Behavior/Discharge Planning: °Goal: Identification of resources available to assist in meeting health care needs will improve °Outcome: Progressing °Goal: Compliance with treatment plan for underlying cause of condition will improve °Outcome: Progressing °  °Problem: Physical Regulation: °Goal: Ability to maintain clinical measurements within normal limits will improve °Outcome: Progressing °  °Problem: Safety: °Goal: Periods of time without injury will increase °Outcome: Progressing °  °Problem: Education: °Goal: Knowledge of disease or condition will improve °Outcome: Progressing °Goal: Understanding of discharge needs will improve °Outcome: Progressing °  °Problem: Health Behavior/Discharge Planning: °Goal: Ability to identify changes in lifestyle to reduce recurrence of condition will improve °Outcome: Progressing °Goal: Identification of resources available to assist in meeting health care needs will improve °Outcome: Progressing °  °Problem: Physical Regulation: °Goal: Complications related to the disease process, condition or treatment will be avoided or minimized °Outcome: Progressing °  °Problem: Safety: °Goal: Ability to remain free from injury will improve °Outcome: Progressing °  °Problem:  Education: °Goal: Ability to make informed decisions regarding treatment will improve °Outcome: Progressing °  °Problem: Coping: °Goal: Coping ability will improve °Outcome: Progressing °  °Problem: Health Behavior/Discharge Planning: °Goal: Identification of resources available to assist in meeting health care needs will improve °Outcome: Progressing °  °Problem: Medication: °Goal: Compliance with prescribed medication regimen will improve °Outcome: Progressing °  °Problem: Self-Concept: °Goal: Ability to disclose and discuss suicidal ideas will improve °Outcome: Progressing °Goal: Will verbalize positive feelings about self °Outcome: Progressing °  °

## 2020-05-25 NOTE — Progress Notes (Signed)
Patient has been out of her room a little bit more. Slightly irritable today and sad. Denies SI, HI and AVH

## 2020-05-26 MED ORDER — PRAZOSIN HCL 1 MG PO CAPS: 1.0000 mg | ORAL_CAPSULE | Freq: Every day | ORAL | Status: DC

## 2020-05-26 MED ORDER — BENZOCAINE 10 % MT GEL
Freq: Four times a day (QID) | OROMUCOSAL | Status: DC | PRN
  Administered 2020-05-26: 1 via OROMUCOSAL
  Filled 2020-05-26: qty 9

## 2020-05-26 NOTE — Progress Notes (Signed)
Patient is calm and cooperative with assessment. Patient affect is flat but brightens on approach. She denies SI and HI. Patient states that last night, she feels like she saw her sister's shadow in her room. Patient remains isolative to her room but came out for breakfast and is appropriate with staff. Support and encouragement was provided to patient. Patient remains safe on the unit at this time and q15 min safety checks have been maintained.

## 2020-05-26 NOTE — Progress Notes (Signed)
Recreation Therapy Notes  Date: 05/26/2020  Time: 10:00 am   Location: Craft room  Behavioral response: N/A   Intervention Topic: Stress Management    Discussion/Intervention: Patient did not attend group.   Clinical Observations/Feedback:  Patient did not attend group.   Shivaay Stormont LRT/CTRS        Janes [place]grove 05/26/2020 12:49 PM

## 2020-05-26 NOTE — [facility] Group Notes (Signed)
LCSW Group Therapy Note   05/26/2020 3:15 PM  Type of Therapy and Topic:  Group Therapy:  Overcoming Obstacles   Participation Level:  Did Not Attend   Description of Group:    In this group patients will be encouraged to explore what they see as obstacles to their own wellness and recovery. They will be guided to discuss their thoughts, feelings, and behaviors related to these obstacles. The group will process together ways to cope with barriers, with attention given to specific choices patients can make. Each patient will be challenged to identify changes they are motivated to make in order to overcome their obstacles. This group will be process-oriented, with patients participating in exploration of their own experiences as well as giving and receiving support and challenge from other group members.   Therapeutic Goals: 1. Patient will identify personal and current obstacles as they relate to admission. 2. Patient will identify barriers that currently interfere with their wellness or overcoming obstacles.  3. Patient will identify feelings, thought process and behaviors related to these barriers. 4. Patient will identify two changes they are willing to make to overcome these obstacles:      Summary of Patient Progress X    Therapeutic Modalities:   Cognitive Behavioral Therapy Solution Focused Therapy Motivational Interviewing Relapse Prevention Therapy  Rachel Nolan R. Algis Greenhouse, MSW, LCSW, LCAS 05/26/2020 3:15 PM

## 2020-05-26 NOTE — Progress Notes (Addendum)
[facility] MD Progress Note  05/26/2020 1:58 PM Rachel Nolan  MRN:  [id number]  Principal Problem: MDD (major depressive disorder), recurrent, severe, with psychosis (HCC) Diagnosis: Principal Problem:   MDD (major depressive disorder), recurrent, severe, with psychosis (HCC) Active Problems:   Post traumatic stress disorder (PTSD)   Cocaine use disorder (HCC)   Substance induced mood disorder (HCC)   Alcohol use disorder, severe, dependence (HCC)   Opioid use disorder, moderate, dependence (HCC)  CC "I'm okay"  RachelNolan is a 50y.o. female with major depressive disorder, PTSD, and substance use disorder, presenting for worsening mood, suicidal ideations, and auditory hallucinations in settings on relapsing on drugs (cocaine and heroin).   Interval History Patient was seen today for re-evaluation.  Nursing reports no events overnight. The patient has no issues with performing ADLs.  Patient has been medication compliant.    Subjective:  Patient seen at bedside today. She is holding a towel to her mouth. She notes that she has a chipped tooth with an exposed nerve causing her pain. She feels her mood is still depressed and anxious. She continues to have passive suicidal thoughts, but not plan or intent. She denies nightmares, homicidal ideations, and auditory hallucinations. She reports visual hallucinations of a shadow of her sister overnight, but denies any during exam. She is still interested in residential substance abuse treatment as she has been unable to stay sober on her own.    Labs: no new results for review.  Total Time spent with patient: 20 minutes  Past Psychiatric History: see H&P  Past Medical History:  Past Medical History:  Diagnosis Date  . Anxiety   . Asthma   . Depression   . Hypertension   . Scoliosis     Past Surgical History:  Procedure Laterality Date  . BACK SURGERY    . ECTOPIC PREGNANCY SURGERY     Family History:  Family History  Problem Relation  Age of Onset  . Hypertension Mother   . Hypertension Father    Family Psychiatric  History: see H&P Social History:  Social History   Substance and Sexual Activity  Alcohol Use Yes     Social History   Substance and Sexual Activity  Drug Use Yes  . Types: Marijuana, Cocaine, "Crack" cocaine    Social History   Socioeconomic History  . Marital status: Widowed    Spouse name: Not on file  . Number of children: Not on file  . Years of education: Not on file  . Highest education level: Not on file  Occupational History  . Not on file  Tobacco Use  . Smoking status: Current Every Day Smoker    Packs/day: 1.00    Types: Cigarettes  . Smokeless tobacco: Never Used  Vaping Use  . Vaping Use: Never used  Substance and Sexual Activity  . Alcohol use: Yes  . Drug use: Yes    Types: Marijuana, Cocaine, "Crack" cocaine  . Sexual activity: Not Currently  Other Topics Concern  . Not on file  Social History Narrative   Pt is homeless, no fixed address; not followed by an outpatient psychiatrist   Social Determinants of Health   Financial Resource Strain: Not on file  Food Insecurity: Not on file  Transportation Needs: Not on file  Physical Activity: Not on file  Stress: Not on file  Social Connections: Not on file   Additional Social History:  Sleep: Fair  Appetite:  Good  Current Medications: Current Facility-Administered Medications  Medication Dose Route Frequency Provider Last Rate Last Admin  . acetaminophen (TYLENOL) tablet 650 mg  650 mg Oral Q6H PRN Jesse Sans, MD      . alum & mag hydroxide-simeth (MAALOX/MYLANTA) 200-200-20 MG/5ML suspension 30 mL  30 mL Oral Q4H PRN Jesse Sans, MD      . amLODipine (NORVASC) tablet 10 mg  10 mg Oral Daily Jesse Sans, MD   10 mg at 05/26/20 0805  . benzocaine (ORAJEL) 10 % mucosal gel   Mouth/Throat QID PRN Jesse Sans, MD      . dicyclomine (BENTYL) tablet 20 mg  20  mg Oral TID PRN Jesse Sans, MD      . FLUoxetine (PROZAC) capsule 40 mg  40 mg Oral Daily Jesse Sans, MD   40 mg at 05/26/20 0805  . gabapentin (NEURONTIN) capsule 300 mg  300 mg Oral TID Jesse Sans, MD   300 mg at 05/26/20 1125  . loperamide (IMODIUM) capsule 2 mg  2 mg Oral Q4H PRN Jesse Sans, MD      . magnesium hydroxide (MILK OF MAGNESIA) suspension 30 mL  30 mL Oral Daily PRN Jesse Sans, MD      . methocarbamol (ROBAXIN) tablet 1,000 mg  1,000 mg Oral Q6H PRN Jesse Sans, MD   1,000 mg at 05/23/20 0759  . nicotine (NICODERM CQ - dosed in mg/24 hours) patch 21 mg  21 mg Transdermal Daily Jesse Sans, MD   21 mg at 05/26/20 0806  . ondansetron (ZOFRAN) tablet 4 mg  4 mg Oral Q8H PRN Jesse Sans, MD      . prazosin (MINIPRESS) capsule 3 mg  3 mg Oral QHS Jesse Sans, MD   3 mg at 05/25/20 2031  . QUEtiapine (SEROQUEL) tablet 400 mg  400 mg Oral QHS Jesse Sans, MD   400 mg at 05/25/20 2032  . traZODone (DESYREL) tablet 150 mg  150 mg Oral QHS Jesse Sans, MD   150 mg at 05/25/20 2032    Lab Results:  No results found for this or any previous visit (from the past 48 hour(s)).  Blood Alcohol level:  Lab Results  Component Value Date   ETH <10 05/15/2020   ETH <10 04/21/2020    Metabolic Disorder Labs: Lab Results  Component Value Date   HGBA1C 5.5 01/24/2020   MPG 111.15 01/24/2020   MPG 105.41 12/07/2019   No results found for: PROLACTIN Lab Results  Component Value Date   CHOL 150 05/17/2020   TRIG 141 05/17/2020   HDL 58 05/17/2020   CHOLHDL 2.6 05/17/2020   VLDL 28 05/17/2020   LDLCALC 64 05/17/2020   LDLCALC 67 01/24/2020    Physical Findings: AIMS:  , ,  ,  ,    CIWA:    COWS:     Musculoskeletal: Strength & Muscle Tone: within normal limits Gait & Station: normal Patient leans: N/A   Physical Exam: Physical Exam Vitals reviewed.    ROS  Blood pressure 131/88, pulse (!) 107, temperature 98.3  F (36.8 C), temperature source Oral, resp. rate 18, height 5\' 6"  (1.676 m), weight 89.4 kg, last menstrual period 06/19/2018, SpO2 98 %. Body mass index is 31.8 kg/m.   Psychiatric Specialty Exam:  Presentation  General Appearance: Disheveled  Eye Contact: fair  Speech:Normal Rate  Speech Volume:Normal  Handedness:Right  Mood and Affect  Mood:Depressed; "so-so"  Affect:Congruent; Constricted   Thought Process  Thought Processes:Coherent  Descriptions of Associations:Intact  Orientation:Full (Time, Place and Person)  Thought Content:Perseveration  History of Schizophrenia/Schizoaffective disorder:No  Duration of Psychotic Symptoms:Less than six months  Hallucinations: Visual hallucination of the shadow of her sister overnight, none during exam  Ideas of Reference:None  Suicidal Thoughts: Passive SI without plan or intent  Homicidal Thoughts:Homicidal Thoughts: No   Sensorium  Memory:Immediate Fair  Judgment:Fair  Insight:Fair   Executive Functions  Concentration:Fair  Attention Span:Fair  Recall:Fair  Fund of Knowledge:Fair  Language:Fair   Psychomotor Activity  Psychomotor Activity:Psychomotor Activity: Decreased   Assets  Assets:Communication Skills; Desire for Improvement   Sleep  Sleep:Sleep: Fair, 7 hours    Treatment Plan Summary: Daily contact with patient to assess and evaluate symptoms and progress in treatment and Medication management  Patient is a 50 year old female with the above-stated past psychiatric history who is seen in follow-up.  Chart reviewed. Patient discussed with nursing. Patient reports partial mood and sleep improvement today. No medication changes made today. SW is working on getting her into a SUD treatment facility    Plan:  -continue inpatient psych admission; 15-minute checks; daily contact with patient to assess and evaluate symptoms and progress in treatment;  psychoeducation.  -continue scheduled medications: 1) MDD, recurrent, severe with psychotic features- established problem, unstable - Seroquel 400 mg nightly for mood and nightmares, fluoxetine 40 mg daily, trazodone 150 mg QHS for insomnia   2) PTSD, chronic- established problem, unstable - Prozac as above, prazosin 3 mg QHS for nightmares  3) Polysubstance abuse (Alcohol, cocaine, heroin, tobacco) - Cessation counseling, nicoderm patch, comfort measures - Patient desires residential treatment - New IV drug use, patient unsure whether needles were clean. Consents to Hepatitis, RPR, and HIV testing today  4) HTN- established problem - Amlodipine 10 mg daily  5) Neuropathic pain - Continue gabapentin 300 mg TID  . amLODipine  10 mg Oral Daily  . FLUoxetine  40 mg Oral Daily  . gabapentin  300 mg Oral TID  . nicotine  21 mg Transdermal Daily  . prazosin  3 mg Oral QHS  . QUEtiapine  400 mg Oral QHS  . traZODone  150 mg Oral QHS    -continue PRN medications.  acetaminophen, alum & mag hydroxide-simeth, benzocaine, dicyclomine, loperamide, magnesium hydroxide, methocarbamol, ondansetron  -Pertinent Labs: no new labs ordered today    -Consults: No new consults placed since yesterday    -Disposition: All necessary aftercare will be arranged prior to discharge. Patient is interested in residential SUD treatment.  -  I certify that the patient does need, on a daily basis, active treatment furnished directly by or requiring the supervision of inpatient psychiatric facility personnel.   05/26/20: Psychiatric exam above reviewed and remains accurate. Assessment and plan above reviewed and updated.    Jesse Sans, MD 05/26/2020, 1:58 PM

## 2020-05-26 NOTE — Tx Team (Signed)
Interdisciplinary Treatment and Diagnostic Plan Update  05/26/2020 Time of Session: 08:30AM Rachel Nolan MRN: [id number]  Principal Diagnosis: MDD (major depressive disorder), recurrent, severe, with psychosis (HCC)  Secondary Diagnoses: Principal Problem:   MDD (major depressive disorder), recurrent, severe, with psychosis (HCC) Active Problems:   Post traumatic stress disorder (PTSD)   Cocaine use disorder (HCC)   Substance induced mood disorder (HCC)   Alcohol use disorder, severe, dependence (HCC)   Opioid use disorder, moderate, dependence (HCC)   Current Medications:  Current Facility-Administered Medications  Medication Dose Route Frequency Provider Last Rate Last Admin  . acetaminophen (TYLENOL) tablet 650 mg  650 mg Oral Q6H PRN Jesse Sans, MD      . alum & mag hydroxide-simeth (MAALOX/MYLANTA) 200-200-20 MG/5ML suspension 30 mL  30 mL Oral Q4H PRN Jesse Sans, MD      . amLODipine (NORVASC) tablet 10 mg  10 mg Oral Daily Jesse Sans, MD   10 mg at 05/26/20 0805  . dicyclomine (BENTYL) tablet 20 mg  20 mg Oral TID PRN Jesse Sans, MD      . FLUoxetine (PROZAC) capsule 40 mg  40 mg Oral Daily Jesse Sans, MD   40 mg at 05/26/20 0805  . gabapentin (NEURONTIN) capsule 300 mg  300 mg Oral TID Jesse Sans, MD   300 mg at 05/26/20 0805  . loperamide (IMODIUM) capsule 2 mg  2 mg Oral Q4H PRN Jesse Sans, MD      . magnesium hydroxide (MILK OF MAGNESIA) suspension 30 mL  30 mL Oral Daily PRN Jesse Sans, MD      . methocarbamol (ROBAXIN) tablet 1,000 mg  1,000 mg Oral Q6H PRN Jesse Sans, MD   1,000 mg at 05/23/20 0759  . nicotine (NICODERM CQ - dosed in mg/24 hours) patch 21 mg  21 mg Transdermal Daily Jesse Sans, MD   21 mg at 05/26/20 0806  . ondansetron (ZOFRAN) tablet 4 mg  4 mg Oral Q8H PRN Jesse Sans, MD      . prazosin (MINIPRESS) capsule 3 mg  3 mg Oral QHS Jesse Sans, MD   3 mg at 05/25/20 2031  . QUEtiapine  (SEROQUEL) tablet 400 mg  400 mg Oral QHS Jesse Sans, MD   400 mg at 05/25/20 2032  . traZODone (DESYREL) tablet 150 mg  150 mg Oral QHS Jesse Sans, MD   150 mg at 05/25/20 2032   PTA Medications: Medications Prior to Admission  Medication Sig Dispense Refill Last Dose  . amLODipine (NORVASC) 10 MG tablet Take 1 tablet (10 mg total) by mouth daily. 30 tablet 1   . amLODipine (NORVASC) 10 MG tablet TAKE 1 TABLET (10 MG TOTAL) BY MOUTH DAILY. 7 tablet 0   . FLUoxetine (PROZAC) 20 MG capsule TAKE 3 CAPSULES (60 MG TOTAL) BY MOUTH DAILY. 21 capsule 0   . FLUoxetine (PROZAC) 40 MG capsule Take 40 mg by mouth daily.     Marland Kitchen gabapentin (NEURONTIN) 300 MG capsule Take 2 capsules (600 mg total) by mouth 3 (three) times daily. (Patient taking differently: Take 300 mg by mouth 3 (three) times daily.) 180 capsule 1   . gabapentin (NEURONTIN) 300 MG capsule TAKE 2 CAPSULES (600 MG) BY MOUTH 3 TIMES DAILY. 42 capsule 0   . prazosin (MINIPRESS) 1 MG capsule Take 3 capsules (3 mg total) by mouth at bedtime. 90 capsule 1   . prazosin (MINIPRESS) 1 MG  capsule TAKE 3 CAPSULES (3 MG) BY MOUTH AT BEDTIME. 21 capsule 0   . QUEtiapine (SEROQUEL) 200 MG tablet TAKE 2 TABLETS (400 MG) BY MOUTH AT BEDTIME. 14 tablet 0   . QUEtiapine (SEROQUEL) 400 MG tablet Take 1 tablet (400 mg total) by mouth at bedtime. 30 tablet 1   . traZODone (DESYREL) 150 MG tablet Take 1 tablet (150 mg total) by mouth at bedtime. 30 tablet 1   . traZODone (DESYREL) 150 MG tablet TAKE 1 TABLET BY MOUTH AT BEDTIME. 7 tablet 0     Patient Stressors: Substance abuse  Patient Strengths: Communication skills  Treatment Modalities: Medication Management, Group therapy, Case management,  1 to 1 session with clinician, Psychoeducation, Recreational therapy.   Physician Treatment Plan for Primary Diagnosis: MDD (major depressive disorder), recurrent, severe, with psychosis (HCC) Long Term Goal(s): Improvement in symptoms so as ready for  discharge Improvement in symptoms so as ready for discharge   Short Term Goals: Ability to identify changes in lifestyle to reduce recurrence of condition will improve Ability to verbalize feelings will improve Ability to disclose and discuss suicidal ideas Ability to demonstrate self-control will improve Ability to identify and develop effective coping behaviors will improve Ability to maintain clinical measurements within normal limits will improve Compliance with prescribed medications will improve Ability to identify triggers associated with substance abuse/mental health issues will improve Ability to identify changes in lifestyle to reduce recurrence of condition will improve Ability to verbalize feelings will improve Ability to disclose and discuss suicidal ideas Ability to demonstrate self-control will improve Ability to identify and develop effective coping behaviors will improve Ability to maintain clinical measurements within normal limits will improve Compliance with prescribed medications will improve Ability to identify triggers associated with substance abuse/mental health issues will improve  Medication Management: Evaluate patient's response, side effects, and tolerance of medication regimen.  Therapeutic Interventions: 1 to 1 sessions, Unit Group sessions and Medication administration.  Evaluation of Outcomes: Progressing  Physician Treatment Plan for Secondary Diagnosis: Principal Problem:   MDD (major depressive disorder), recurrent, severe, with psychosis (HCC) Active Problems:   Post traumatic stress disorder (PTSD)   Cocaine use disorder (HCC)   Substance induced mood disorder (HCC)   Alcohol use disorder, severe, dependence (HCC)   Opioid use disorder, moderate, dependence (HCC)  Long Term Goal(s): Improvement in symptoms so as ready for discharge Improvement in symptoms so as ready for discharge   Short Term Goals: Ability to identify changes in lifestyle  to reduce recurrence of condition will improve Ability to verbalize feelings will improve Ability to disclose and discuss suicidal ideas Ability to demonstrate self-control will improve Ability to identify and develop effective coping behaviors will improve Ability to maintain clinical measurements within normal limits will improve Compliance with prescribed medications will improve Ability to identify triggers associated with substance abuse/mental health issues will improve Ability to identify changes in lifestyle to reduce recurrence of condition will improve Ability to verbalize feelings will improve Ability to disclose and discuss suicidal ideas Ability to demonstrate self-control will improve Ability to identify and develop effective coping behaviors will improve Ability to maintain clinical measurements within normal limits will improve Compliance with prescribed medications will improve Ability to identify triggers associated with substance abuse/mental health issues will improve     Medication Management: Evaluate patient's response, side effects, and tolerance of medication regimen.  Therapeutic Interventions: 1 to 1 sessions, Unit Group sessions and Medication administration.  Evaluation of Outcomes: Progressing   RN Treatment  Plan for Primary Diagnosis: MDD (major depressive disorder), recurrent, severe, with psychosis (HCC) Long Term Goal(s): Knowledge of disease and therapeutic regimen to maintain health will improve  Short Term Goals: Ability to remain free from injury will improve, Ability to verbalize frustration and anger appropriately will improve, Ability to demonstrate self-control, Ability to participate in decision making will improve, Ability to verbalize feelings will improve, Ability to disclose and discuss suicidal ideas, Ability to identify and develop effective coping behaviors will improve and Compliance with prescribed medications will improve  Medication  Management: RN will administer medications as ordered by provider, will assess and evaluate patient's response and provide education to patient for prescribed medication. RN will report any adverse and/or side effects to prescribing provider.  Therapeutic Interventions: 1 on 1 counseling sessions, Psychoeducation, Medication administration, Evaluate responses to treatment, Monitor vital signs and CBGs as ordered, Perform/monitor CIWA, COWS, AIMS and Fall Risk screenings as ordered, Perform wound care treatments as ordered.  Evaluation of Outcomes: Progressing   LCSW Treatment Plan for Primary Diagnosis: MDD (major depressive disorder), recurrent, severe, with psychosis (HCC) Long Term Goal(s): Safe transition to appropriate next level of care at discharge, Engage patient in therapeutic group addressing interpersonal concerns.  Short Term Goals: Engage patient in aftercare planning with referrals and resources, Increase social support, Increase ability to appropriately verbalize feelings, Increase emotional regulation, Facilitate acceptance of mental health diagnosis and concerns, Facilitate patient progression through stages of change regarding substance use diagnoses and concerns, Identify triggers associated with mental health/substance abuse issues and Increase skills for wellness and recovery  Therapeutic Interventions: Assess for all discharge needs, 1 to 1 time with Social worker, Explore available resources and support systems, Assess for adequacy in community support network, Educate family and significant other(s) on suicide prevention, Complete Psychosocial Assessment, Interpersonal group therapy.  Evaluation of Outcomes: Progressing   Progress in Treatment: Attending groups: No. Participating in groups: No. Taking medication as prescribed: Yes. Toleration medication: Yes. Family/Significant other contact made: Yes, individual(s) contacted:  completed with patient Patient understands  diagnosis: Yes. Discussing patient identified problems/goals with staff: Yes. Medical problems stabilized or resolved: Yes. Denies suicidal/homicidal ideation: Yes. Issues/concerns per patient self-inventory: Yes. Other: none    New problem(s) identified: No, Describe:  No additional problems identified at treatment meeting.  New Short Term/Long Term Goal(s): detox, elimination of symptoms of psychosis, medication management for mood stabilization; elimination of SI thoughts; development of comprehensive mental wellness/sobriety plan. Update 05/21/20: None Update 05/26/20: No changes at this time.   Patient Goals:  "try to find a treatment place and some where for me and my son to stay at" Update 05/21/20: None  Update 05/26/20: No changes at this time.   Discharge Plan or Barriers: None identified at this time.  Update 05/21/20: Referrals for inpatient substance use treatment sent out.  Update 05/26/20: Awaiting bed placement from [facility] or confirmation of acceptance at other programs.    Reason for Continuation of Hospitalization: Anxiety Depression Medication stabilization Withdrawal symptoms  Estimated Length of Stay: 2-7 days    Attendees: Patient:  05/26/2020 9:52 AM  Physician: Les Pou, MD 05/26/2020 9:52 AM  Nursing:   05/26/2020 9:52 AM  RN Care Manager: 05/26/2020 9:52 AM  Social Worker: Gwenevere Ghazi, MSW, [place], LCASA  05/26/2020 9:52 AM  Recreational Therapist:  05/26/2020 9:52 AM  Other: Kiva [place], LCSWA 05/26/2020 9:52 AM  Other: Jillyn Hidden, MSW, LCSW, LCAS  05/26/2020 9:52 AM  Other: 05/26/2020 9:52 AM    Scribe for  Treatment Team: Glenis Smokerarl R Misbah Hornaday, LCSW 05/26/2020 9:52 AM

## 2020-05-26 NOTE — [facility] Counselor (Signed)
CSW was informed via VM by Bayard Males about pt's potential placement at [facility]. [facility] stated that they are waiting to hear back from the medical staff and will get back to CSW about acceptance.   Dayanna Pryce [place], MSW, LCSW-A 5/9/20224:13 PM

## 2020-05-27 MED ORDER — SALINE SPRAY 0.65 % NA SOLN
2.0000 | NASAL | Status: DC | PRN
  Administered 2020-05-31: 2 via NASAL
  Filled 2020-05-27: qty 44

## 2020-05-27 MED ORDER — PRAZOSIN HCL 1 MG PO CAPS
3.0000 mg | ORAL_CAPSULE | Freq: Every day | ORAL | Status: DC
  Administered 2020-05-27 – 2020-06-01 (×6): 3 mg via ORAL
  Filled 2020-05-27 (×7): qty 3

## 2020-05-27 NOTE — Progress Notes (Signed)
Patient is pleasant and easy to engage in conversation.  She is active on the unit spending a lot of time in the day room and engages well with other peers.  Reports eating well, but not sleeping as well as she needs.  She has no complaints and has not been asking for pain medication. She is med compliant and tolerated her meds without incident. She remains monitored  With 15 minute safety checks and was encouraged to come to staff with any concerns.       Cleo Butler-Nicholson,LPN

## 2020-05-27 NOTE — Progress Notes (Signed)
Recreation Therapy Notes    Date: 05/27/2020  Time: 9:45am   Location: Craft room  Behavioral response: N/A   Intervention Topic: Coping Skills    Discussion/Intervention: Patient did not attend group.   Clinical Observations/Feedback:  Patient did not attend group.   Reeanna Acri LRT/CTRS        Darald Uzzle 05/27/2020 12:12 PM

## 2020-05-27 NOTE — Progress Notes (Signed)
[facility] MD Progress Note  05/27/2020 12:10 PM Rachel Nolan  MRN:  [id number]  Principal Problem: MDD (major depressive disorder), recurrent, severe, with psychosis (HCC) Diagnosis: Principal Problem:   MDD (major depressive disorder), recurrent, severe, with psychosis (HCC) Active Problems:   Post traumatic stress disorder (PTSD)   Cocaine use disorder (HCC)   Substance induced mood disorder (HCC)   Alcohol use disorder, severe, dependence (HCC)   Opioid use disorder, moderate, dependence (HCC)  CC "I'm so-so"  Rachel Nolan is a 50y.o. female with major depressive disorder, PTSD, and substance use disorder, presenting for worsening mood, suicidal ideations, and auditory hallucinations in settings on relapsing on drugs (cocaine and heroin).   Interval History Patient was seen today for re-evaluation.  Nursing reports no events overnight. The patient has no issues with performing ADLs.  Patient has been medication compliant.    Subjective:  Patient seen at bedside today. She notes she is feeling "so-so" today. Continues to have depression, anxiety, and passive thoughts of death. However, denies active SI/HI. She remains hopeful to go to residential rehab for substance abuse. She denies any AH/VH overnight. She feels her tooth pain improved with orajel. She notes some general congestion in her nose, and requests some saline nasal spray. No other complaints.    Labs: no new results for review.  Total Time spent with patient: 20 minutes  Past Psychiatric History: see H&P  Past Medical History:  Past Medical History:  Diagnosis Date  . Anxiety   . Asthma   . Depression   . Hypertension   . Scoliosis     Past Surgical History:  Procedure Laterality Date  . BACK SURGERY    . ECTOPIC PREGNANCY SURGERY     Family History:  Family History  Problem Relation Age of Onset  . Hypertension Mother   . Hypertension Father    Family Psychiatric  History: see H&P Social History:   Social History   Substance and Sexual Activity  Alcohol Use Yes     Social History   Substance and Sexual Activity  Drug Use Yes  . Types: Marijuana, Cocaine, "Crack" cocaine    Social History   Socioeconomic History  . Marital status: Widowed    Spouse name: Not on file  . Number of children: Not on file  . Years of education: Not on file  . Highest education level: Not on file  Occupational History  . Not on file  Tobacco Use  . Smoking status: Current Every Day Smoker    Packs/day: 1.00    Types: Cigarettes  . Smokeless tobacco: Never Used  Vaping Use  . Vaping Use: Never used  Substance and Sexual Activity  . Alcohol use: Yes  . Drug use: Yes    Types: Marijuana, Cocaine, "Crack" cocaine  . Sexual activity: Not Currently  Other Topics Concern  . Not on file  Social History Narrative   Pt is homeless, no fixed address; not followed by an outpatient psychiatrist   Social Determinants of Health   Financial Resource Strain: Not on file  Food Insecurity: Not on file  Transportation Needs: Not on file  Physical Activity: Not on file  Stress: Not on file  Social Connections: Not on file   Additional Social History:                         Sleep: Fair  Appetite:  Good  Current Medications: Current Facility-Administered Medications  Medication Dose Route Frequency Provider  Last Rate Last Admin  . acetaminophen (TYLENOL) tablet 650 mg  650 mg Oral Q6H PRN Jesse Sans, MD      . alum & mag hydroxide-simeth (MAALOX/MYLANTA) 200-200-20 MG/5ML suspension 30 mL  30 mL Oral Q4H PRN Jesse Sans, MD      . amLODipine (NORVASC) tablet 10 mg  10 mg Oral Daily Jesse Sans, MD   10 mg at 05/27/20 0754  . benzocaine (ORAJEL) 10 % mucosal gel   Mouth/Throat QID PRN Jesse Sans, MD   1 application at 05/26/20 1637  . dicyclomine (BENTYL) tablet 20 mg  20 mg Oral TID PRN Jesse Sans, MD      . FLUoxetine (PROZAC) capsule 40 mg  40 mg Oral  Daily Jesse Sans, MD   40 mg at 05/27/20 0755  . gabapentin (NEURONTIN) capsule 300 mg  300 mg Oral TID Jesse Sans, MD   300 mg at 05/27/20 0755  . loperamide (IMODIUM) capsule 2 mg  2 mg Oral Q4H PRN Jesse Sans, MD      . magnesium hydroxide (MILK OF MAGNESIA) suspension 30 mL  30 mL Oral Daily PRN Jesse Sans, MD      . methocarbamol (ROBAXIN) tablet 1,000 mg  1,000 mg Oral Q6H PRN Jesse Sans, MD   1,000 mg at 05/23/20 0759  . nicotine (NICODERM CQ - dosed in mg/24 hours) patch 21 mg  21 mg Transdermal Daily Jesse Sans, MD   21 mg at 05/26/20 0806  . ondansetron (ZOFRAN) tablet 4 mg  4 mg Oral Q8H PRN Jesse Sans, MD      . prazosin (MINIPRESS) capsule 1 mg  1 mg Oral QHS Sharen Hones, RPH       And  . prazosin (MINIPRESS) capsule 1 mg  1 mg Oral QHS Sharen Hones, RPH       And  . prazosin (MINIPRESS) capsule 1 mg  1 mg Oral QHS Sharen Hones, RPH      . QUEtiapine (SEROQUEL) tablet 400 mg  400 mg Oral QHS Jesse Sans, MD   400 mg at 05/26/20 2114  . sodium chloride (OCEAN) 0.65 % nasal spray 2 spray  2 spray Each Nare Q4H PRN Jesse Sans, MD      . traZODone (DESYREL) tablet 150 mg  150 mg Oral QHS Jesse Sans, MD   150 mg at 05/26/20 2114    Lab Results:  No results found for this or any previous visit (from the past 48 hour(s)).  Blood Alcohol level:  Lab Results  Component Value Date   ETH <10 05/15/2020   ETH <10 04/21/2020    Metabolic Disorder Labs: Lab Results  Component Value Date   HGBA1C 5.5 01/24/2020   MPG 111.15 01/24/2020   MPG 105.41 12/07/2019   No results found for: PROLACTIN Lab Results  Component Value Date   CHOL 150 05/17/2020   TRIG 141 05/17/2020   HDL 58 05/17/2020   CHOLHDL 2.6 05/17/2020   VLDL 28 05/17/2020   LDLCALC 64 05/17/2020   LDLCALC 67 01/24/2020    Physical Findings: AIMS:  , ,  ,  ,    CIWA:    COWS:     Musculoskeletal: Strength & Muscle Tone: within normal  limits Gait & Station: normal Patient leans: N/A   Physical Exam: Physical Exam Vitals reviewed.    ROS  Blood pressure 127/90, pulse 95, temperature 97.8  F (36.6 C), temperature source Oral, resp. rate 18, height 5\' 6"  (1.676 m), weight 89.4 kg, last menstrual period 06/19/2018, SpO2 98 %. Body mass index is 31.8 kg/m.   Psychiatric Specialty Exam:  Presentation  General Appearance: Disheveled  Eye Contact: fair  Speech:Normal Rate  Speech Volume:Normal  Handedness:Right   Mood and Affect  Mood:Depressed; "so-so"  Affect:Congruent; Constricted   Thought Process  Thought Processes:Coherent  Descriptions of Associations:Intact  Orientation:Full (Time, Place and Person)  Thought Content:Perseveration  History of Schizophrenia/Schizoaffective disorder:No  Duration of Psychotic Symptoms:Less than six months  Hallucinations: Visual hallucination of the shadow of her sister overnight, none during exam  Ideas of Reference:None  Suicidal Thoughts: Passive SI without plan or intent  Homicidal Thoughts:Homicidal Thoughts: No   Sensorium  Memory:Immediate Fair  Judgment:Fair  Insight:Fair   Executive Functions  Concentration:Fair  Attention Span:Fair  Recall:Fair  Fund of Knowledge:Fair  Language:Fair   Psychomotor Activity  Psychomotor Activity:Psychomotor Activity: Decreased   Assets  Assets:Communication Skills; Desire for Improvement   Sleep  Sleep:Sleep: Fair, 7 hours    Treatment Plan Summary: Daily contact with patient to assess and evaluate symptoms and progress in treatment and Medication management  Patient is a 50 year old female with the above-stated past psychiatric history who is seen in follow-up.  Chart reviewed. Patient discussed with nursing. Patient reports partial mood and sleep improvement today. No medication changes made today. SW is working on getting her into a SUD  treatment facility    Plan:  -continue inpatient psych admission; 15-minute checks; daily contact with patient to assess and evaluate symptoms and progress in treatment; psychoeducation.  -continue scheduled medications: 1) MDD, recurrent, severe with psychotic features- established problem, improving - Seroquel 400 mg nightly for mood and nightmares, fluoxetine 40 mg daily, trazodone 150 mg QHS for insomnia   2) PTSD, chronic- established problem, improving - Prozac as above, prazosin 3 mg QHS for nightmares  3) Polysubstance abuse (Alcohol, cocaine, heroin, tobacco) - Cessation counseling, nicoderm patch, comfort measures - Patient desires residential treatment - New IV drug use, patient unsure whether needles were clean. Hepatitis, RPR, and HIV negative on 05/16/20  4) HTN- established problem - Amlodipine 10 mg daily  5) Neuropathic pain - Continue gabapentin 300 mg TID  . amLODipine  10 mg Oral Daily  . FLUoxetine  40 mg Oral Daily  . gabapentin  300 mg Oral TID  . nicotine  21 mg Transdermal Daily  . prazosin  1 mg Oral QHS   And  . prazosin  1 mg Oral QHS   And  . prazosin  1 mg Oral QHS  . QUEtiapine  400 mg Oral QHS  . traZODone  150 mg Oral QHS    -continue PRN medications.  acetaminophen, alum & mag hydroxide-simeth, benzocaine, dicyclomine, loperamide, magnesium hydroxide, methocarbamol, ondansetron, sodium chloride  -Pertinent Labs: no new labs ordered today    -Consults: No new consults placed since yesterday    -Disposition: All necessary aftercare will be arranged prior to discharge. Patient is interested in residential SUD treatment.  -  I certify that the patient does need, on a daily basis, active treatment furnished directly by or requiring the supervision of inpatient psychiatric facility personnel.   05/27/20: Psychiatric exam above reviewed and remains accurate. Assessment and plan above reviewed and updated.     07/27/20,  MD 05/27/2020, 12:10 PM

## 2020-05-27 NOTE — Plan of Care (Signed)
  Problem: Education: Goal: Knowledge of [place] [facility] Education information/materials will improve Outcome: Progressing Goal: Emotional status will improve Outcome: Progressing Goal: Mental status will improve Outcome: Progressing Goal: Verbalization of understanding the information provided will improve Outcome: Progressing   Problem: Activity: Goal: Interest or engagement in activities will improve Outcome: Progressing Goal: Sleeping patterns will improve Outcome: Progressing   Problem: Coping: Goal: Ability to verbalize frustrations and anger appropriately will improve Outcome: Progressing Goal: Ability to demonstrate self-control will improve Outcome: Progressing   

## 2020-05-27 NOTE — Plan of Care (Addendum)
Pt rates depression and hopelessness both 6/10. Pt denies SI, HI and AVH. Pt was educated on care plan and verbalizes understanding. Torrie Mayers RN Problem: Education: Goal: Knowledge of [facility] Education information/materials will improve Outcome: Progressing Goal: Emotional status will improve Outcome: Progressing Goal: Mental status will improve Outcome: Progressing Goal: Verbalization of understanding the information provided will improve Outcome: Progressing   Problem: Activity: Goal: Interest or engagement in activities will improve Outcome: Progressing Goal: Sleeping patterns will improve Outcome: Progressing   Problem: Coping: Goal: Ability to verbalize frustrations and anger appropriately will improve Outcome: Progressing Goal: Ability to demonstrate self-control will improve Outcome: Progressing   Problem: Health Behavior/Discharge Planning: Goal: Identification of resources available to assist in meeting health care needs will improve Outcome: Progressing Goal: Compliance with treatment plan for underlying cause of condition will improve Outcome: Progressing   Problem: Physical Regulation: Goal: Ability to maintain clinical measurements within normal limits will improve Outcome: Progressing   Problem: Safety: Goal: Periods of time without injury will increase Outcome: Progressing   Problem: Education: Goal: Knowledge of disease or condition will improve Outcome: Progressing Goal: Understanding of discharge needs will improve Outcome: Progressing   Problem: Health Behavior/Discharge Planning: Goal: Ability to identify changes in lifestyle to reduce recurrence of condition will improve Outcome: Progressing Goal: Identification of resources available to assist in meeting health care needs will improve Outcome: Progressing   Problem: Physical Regulation: Goal: Complications related to the disease process, condition or treatment will be avoided or  minimized Outcome: Progressing   Problem: Safety: Goal: Ability to remain free from injury will improve Outcome: Progressing   Problem: Education: Goal: Ability to make informed decisions regarding treatment will improve Outcome: Progressing   Problem: Coping: Goal: Coping ability will improve Outcome: Progressing   Problem: Health Behavior/Discharge Planning: Goal: Identification of resources available to assist in meeting health care needs will improve Outcome: Progressing   Problem: Medication: Goal: Compliance with prescribed medication regimen will improve Outcome: Progressing   Problem: Self-Concept: Goal: Ability to disclose and discuss suicidal ideas will improve Outcome: Progressing Goal: Will verbalize positive feelings about self Outcome: Progressing

## 2020-05-27 NOTE — [facility] Group Notes (Signed)
LCSW Group Therapy Note     05/27/2020 2:00 PM     Type of Therapy/Topic:  Group Therapy:  Feelings about Diagnosis     Participation Level:  Did Not Attend     Description of Group:   This group will allow patients to explore their thoughts and feelings about diagnoses they have received. Patients will be guided to explore their level of understanding and acceptance of these diagnoses. Facilitator will encourage patients to process their thoughts and feelings about the reactions of others to their diagnosis and will guide patients in identifying ways to discuss their diagnosis with significant others in their lives. This group will be process-oriented, with patients participating in exploration of their own experiences, giving and receiving support, and processing challenge from other group members.        Therapeutic Goals:  1.    Patient will demonstrate understanding of diagnosis as evidenced by identifying two or more symptoms of the disorder  2.    Patient will be able to express two feelings regarding the diagnosis  3.    Patient will demonstrate their ability to communicate their needs through discussion and/or role play     Summary of Patient Progress: X  Therapeutic Modalities:   Cognitive Behavioral Therapy  Brief Therapy  Feelings Identification    Khayree Delellis [place], MSW, LCSW-A  05/27/2020 2:00 PM

## 2020-05-27 NOTE — Progress Notes (Signed)
Pt did not attend groups. She was withdrawn in her room except for meal time. Pt rates depression 2/10 and denies anxiety, SI, HI and AVH. Pt has been pleasant, calm and cooperative. Torrie Mayers RN

## 2020-05-27 NOTE — Plan of Care (Signed)
  Problem: Group Participation Goal: STG - Patient will engage in groups without prompting or encouragement from LRT x3 group sessions within 5 recreation therapy group sessions Description: STG - Patient will engage in groups without prompting or encouragement from LRT x3 group sessions within 5 recreation therapy group sessions Outcome: Not Progressing   

## 2020-05-28 NOTE — Progress Notes (Signed)
[facility] MD Progress Note  05/28/2020 12:12 PM Rachel Nolan  MRN:  [id number]  Principal Problem: MDD (major depressive disorder), recurrent, severe, with psychosis (HCC) Diagnosis: Principal Problem:   MDD (major depressive disorder), recurrent, severe, with psychosis (HCC) Active Problems:   Post traumatic stress disorder (PTSD)   Cocaine use disorder (HCC)   Substance induced mood disorder (HCC)   Alcohol use disorder, severe, dependence (HCC)   Opioid use disorder, moderate, dependence (HCC)  CC "Tooth is hurting today"  Rachel Nolan is a 50y.o. female with major depressive disorder, PTSD, and substance use disorder, presenting for worsening mood, suicidal ideations, and auditory hallucinations in settings on relapsing on drugs (cocaine and heroin).   Interval History Patient was seen today for re-evaluation.  Nursing reports no events overnight. The patient has no issues with performing ADLs.  Patient has been medication compliant.    Subjective:  Patent seen at bedside today. She notes her tooth is hurting again today, but orajel is helping. She denies any Si/HI/AH/VH. Remains hopeful to attend residential substance abuse treatment program for cocaine and heroin use.  Labs: no new results for review.  Total Time spent with patient: 20 minutes  Past Psychiatric History: see H&P  Past Medical History:  Past Medical History:  Diagnosis Date  . Anxiety   . Asthma   . Depression   . Hypertension   . Scoliosis     Past Surgical History:  Procedure Laterality Date  . BACK SURGERY    . ECTOPIC PREGNANCY SURGERY     Family History:  Family History  Problem Relation Age of Onset  . Hypertension Mother   . Hypertension Father    Family Psychiatric  History: see H&P Social History:  Social History   Substance and Sexual Activity  Alcohol Use Yes     Social History   Substance and Sexual Activity  Drug Use Yes  . Types: Marijuana, Cocaine, "Crack" cocaine    Social  History   Socioeconomic History  . Marital status: Widowed    Spouse name: Not on file  . Number of children: Not on file  . Years of education: Not on file  . Highest education level: Not on file  Occupational History  . Not on file  Tobacco Use  . Smoking status: Current Every Day Smoker    Packs/day: 1.00    Types: Cigarettes  . Smokeless tobacco: Never Used  Vaping Use  . Vaping Use: Never used  Substance and Sexual Activity  . Alcohol use: Yes  . Drug use: Yes    Types: Marijuana, Cocaine, "Crack" cocaine  . Sexual activity: Not Currently  Other Topics Concern  . Not on file  Social History Narrative   Pt is homeless, no fixed address; not followed by an outpatient psychiatrist   Social Determinants of Health   Financial Resource Strain: Not on file  Food Insecurity: Not on file  Transportation Needs: Not on file  Physical Activity: Not on file  Stress: Not on file  Social Connections: Not on file   Additional Social History:                         Sleep: Fair  Appetite:  Good  Current Medications: Current Facility-Administered Medications  Medication Dose Route Frequency Provider Last Rate Last Admin  . acetaminophen (TYLENOL) tablet 650 mg  650 mg Oral Q6H PRN Jesse Sans, MD      . alum & mag hydroxide-simeth (MAALOX/MYLANTA)  200-200-20 MG/5ML suspension 30 mL  30 mL Oral Q4H PRN Jesse Sans, MD      . amLODipine (NORVASC) tablet 10 mg  10 mg Oral Daily Jesse Sans, MD   10 mg at 05/28/20 0800  . benzocaine (ORAJEL) 10 % mucosal gel   Mouth/Throat QID PRN Jesse Sans, MD   1 application at 05/26/20 1637  . dicyclomine (BENTYL) tablet 20 mg  20 mg Oral TID PRN Jesse Sans, MD      . FLUoxetine (PROZAC) capsule 40 mg  40 mg Oral Daily Jesse Sans, MD   40 mg at 05/28/20 0801  . gabapentin (NEURONTIN) capsule 300 mg  300 mg Oral TID Jesse Sans, MD   300 mg at 05/28/20 0801  . loperamide (IMODIUM) capsule 2 mg  2  mg Oral Q4H PRN Jesse Sans, MD      . magnesium hydroxide (MILK OF MAGNESIA) suspension 30 mL  30 mL Oral Daily PRN Jesse Sans, MD      . methocarbamol (ROBAXIN) tablet 1,000 mg  1,000 mg Oral Q6H PRN Jesse Sans, MD   1,000 mg at 05/23/20 0759  . nicotine (NICODERM CQ - dosed in mg/24 hours) patch 21 mg  21 mg Transdermal Daily Jesse Sans, MD   21 mg at 05/26/20 0806  . ondansetron (ZOFRAN) tablet 4 mg  4 mg Oral Q8H PRN Jesse Sans, MD      . prazosin (MINIPRESS) capsule 3 mg  3 mg Oral QHS Sharen Hones, RPH   3 mg at 05/27/20 2127  . QUEtiapine (SEROQUEL) tablet 400 mg  400 mg Oral QHS Jesse Sans, MD   400 mg at 05/27/20 2128  . sodium chloride (OCEAN) 0.65 % nasal spray 2 spray  2 spray Each Nare Q4H PRN Jesse Sans, MD      . traZODone (DESYREL) tablet 150 mg  150 mg Oral QHS Jesse Sans, MD   150 mg at 05/27/20 2128    Lab Results:  No results found for this or any previous visit (from the past 48 hour(s)).  Blood Alcohol level:  Lab Results  Component Value Date   ETH <10 05/15/2020   ETH <10 04/21/2020    Metabolic Disorder Labs: Lab Results  Component Value Date   HGBA1C 5.5 01/24/2020   MPG 111.15 01/24/2020   MPG 105.41 12/07/2019   No results found for: PROLACTIN Lab Results  Component Value Date   CHOL 150 05/17/2020   TRIG 141 05/17/2020   HDL 58 05/17/2020   CHOLHDL 2.6 05/17/2020   VLDL 28 05/17/2020   LDLCALC 64 05/17/2020   LDLCALC 67 01/24/2020    Physical Findings: AIMS:  , ,  ,  ,    CIWA:    COWS:     Musculoskeletal: Strength & Muscle Tone: within normal limits Gait & Station: normal Patient leans: N/A   Physical Exam: Physical Exam Vitals reviewed.    ROS  Blood pressure (!) 135/93, pulse 96, temperature 97.9 F (36.6 C), temperature source Oral, resp. rate 18, height 5\' 6"  (1.676 m), weight 89.4 kg, last menstrual period 06/19/2018, SpO2 98 %. Body mass index is 31.8  kg/m.   Psychiatric Specialty Exam:  Presentation  General Appearance: Disheveled  Eye Contact: fair  Speech:Normal Rate  Speech Volume:Normal  Handedness:Right   Mood and Affect  Mood: "okay"  Affect:Congruent; euthymic   Thought Process  Thought Processes:Coherent  Descriptions of  Associations:Intact  Orientation:Full (Time, Place and Person)  Thought Content:Perseveration  History of Schizophrenia/Schizoaffective disorder:No  Duration of Psychotic Symptoms:Less than six months  Hallucinations: Denies  Ideas of Reference:None  Suicidal Thoughts: Denies  Homicidal Thoughts:Homicidal Thoughts: No   Sensorium  Memory:Immediate Fair  Judgment:Fair  Insight:Fair   Executive Functions  Concentration:Fair  Attention Span:Fair  Recall:Fair  Fund of Knowledge:Fair  Language:Fair   Psychomotor Activity  Psychomotor Activity:Psychomotor Activity: Decreased   Assets  Assets:Communication Skills; Desire for Improvement   Sleep  Sleep:Sleep: Fair, 8.15 hours    Treatment Plan Summary: Daily contact with patient to assess and evaluate symptoms and progress in treatment and Medication management  Patient is a 50 year old female with the above-stated past psychiatric history who is seen in follow-up.  Chart reviewed. Patient discussed with nursing. Patient reports  mood and sleep improvement today. No medication changes made today. SW is working on getting her into a SUD treatment facility    Plan:  -continue inpatient psych admission; 15-minute checks; daily contact with patient to assess and evaluate symptoms and progress in treatment; psychoeducation.  -continue scheduled medications: 1) MDD, recurrent, severe with psychotic features- established problem, stable - Seroquel 400 mg nightly for mood and nightmares, fluoxetine 40 mg daily, trazodone 150 mg QHS for insomnia   2) PTSD, chronic- established  problem, stable - Prozac as above, prazosin 3 mg QHS for nightmares  3) Polysubstance abuse (Alcohol, cocaine, heroin, tobacco) - Cessation counseling, nicoderm patch - Patient desires residential treatment -Hepatitis, RPR, and HIV negative  4) HTN- established problem - Amlodipine 10 mg daily  5) Neuropathic pain - Continue gabapentin 300 mg TID  . amLODipine  10 mg Oral Daily  . FLUoxetine  40 mg Oral Daily  . gabapentin  300 mg Oral TID  . nicotine  21 mg Transdermal Daily  . prazosin  3 mg Oral QHS  . QUEtiapine  400 mg Oral QHS  . traZODone  150 mg Oral QHS    -continue PRN medications.  acetaminophen, alum & mag hydroxide-simeth, benzocaine, dicyclomine, loperamide, magnesium hydroxide, methocarbamol, ondansetron, sodium chloride  -Pertinent Labs: no new labs ordered today    -Consults: No new consults placed since yesterday    -Disposition: All necessary aftercare will be arranged prior to discharge. Patient is interested in residential SUD treatment.  -  I certify that the patient does need, on a daily basis, active treatment furnished directly by or requiring the supervision of inpatient psychiatric facility personnel.   05/28/20: Psychiatric exam above reviewed and remains accurate. Assessment and plan above reviewed and updated.     Jesse Sans, MD 05/28/2020, 12:12 PM

## 2020-05-28 NOTE — [facility] Counselor (Signed)
CSW was contacted (voicemail left) by [facility] of [facility]. Pt was declined at this program due to medical/mental health issues.   Vilma Meckel. Algis Greenhouse, MSW, LCSW, LCAS 05/28/2020 11:35 AM

## 2020-05-28 NOTE — [facility] Counselor (Signed)
CSW had voicemail from [employer] at [employer]. He informed CSW that pt was denied by the program due to her medical and mental health issues/needs.   CSW contacted [facility] admissions, [phone]) [phone]. They informed CSW that beds are not available for pt. CSW will follow up tomorrow regarding bed availability.   Chadwick Reiswig [place], MSW, LCSW-A 5/11/20222:37 PM

## 2020-05-28 NOTE — Progress Notes (Signed)
Pt has been isolative to her room except for meals. Pt is pleasant and has no complaints.  Torrie Mayers RN

## 2020-05-28 NOTE — Plan of Care (Signed)
Pt denies depression, SI, HI and AVH. Pt rates anxiety 2/10. Pt denies SI, HI and AVH. Pt was educated on care plan and verbalizes understanding. Torrie Mayers RN Problem: Education: Goal: Knowledge of [facility] Education information/materials will improve Outcome: Progressing Goal: Emotional status will improve Outcome: Progressing Goal: Mental status will improve Outcome: Progressing Goal: Verbalization of understanding the information provided will improve Outcome: Progressing   Problem: Activity: Goal: Interest or engagement in activities will improve Outcome: Progressing Goal: Sleeping patterns will improve Outcome: Progressing   Problem: Coping: Goal: Ability to verbalize frustrations and anger appropriately will improve Outcome: Progressing Goal: Ability to demonstrate self-control will improve Outcome: Progressing   Problem: Health Behavior/Discharge Planning: Goal: Identification of resources available to assist in meeting health care needs will improve Outcome: Progressing Goal: Compliance with treatment plan for underlying cause of condition will improve Outcome: Progressing   Problem: Physical Regulation: Goal: Ability to maintain clinical measurements within normal limits will improve Outcome: Progressing   Problem: Safety: Goal: Periods of time without injury will increase Outcome: Progressing   Problem: Education: Goal: Knowledge of disease or condition will improve Outcome: Progressing Goal: Understanding of discharge needs will improve Outcome: Progressing   Problem: Health Behavior/Discharge Planning: Goal: Ability to identify changes in lifestyle to reduce recurrence of condition will improve Outcome: Progressing Goal: Identification of resources available to assist in meeting health care needs will improve Outcome: Progressing   Problem: Physical Regulation: Goal: Complications related to the disease process, condition or treatment will  be avoided or minimized Outcome: Progressing   Problem: Safety: Goal: Ability to remain free from injury will improve Outcome: Progressing   Problem: Education: Goal: Ability to make informed decisions regarding treatment will improve Outcome: Progressing   Problem: Coping: Goal: Coping ability will improve Outcome: Progressing   Problem: Health Behavior/Discharge Planning: Goal: Identification of resources available to assist in meeting health care needs will improve Outcome: Progressing   Problem: Medication: Goal: Compliance with prescribed medication regimen will improve Outcome: Progressing   Problem: Self-Concept: Goal: Ability to disclose and discuss suicidal ideas will improve Outcome: Progressing Goal: Will verbalize positive feelings about self Outcome: Progressing

## 2020-05-28 NOTE — Progress Notes (Signed)
Sleep: 8.15 

## 2020-05-28 NOTE — B[facility] Group Notes (Signed)
LCSW Group Therapy Note  05/28/2020 2:52 PM  Type of Therapy/Topic:  Group Therapy:  Emotion Regulation  Participation Level:  Did Not Attend   Description of Group:   The purpose of this group is to assist patients in learning to regulate negative emotions and experience positive emotions. Patients will be guided to discuss ways in which they have been vulnerable to their negative emotions. These vulnerabilities will be juxtaposed with experiences of positive emotions or situations, and patients will be challenged to use positive emotions to combat negative ones. Special emphasis will be placed on coping with negative emotions in conflict situations, and patients will process healthy conflict resolution skills.  Therapeutic Goals: 1. Patient will identify two positive emotions or experiences to reflect on in order to balance out negative emotions 2. Patient will label two or more emotions that they find the most difficult to experience 3. Patient will demonstrate positive conflict resolution skills through discussion and/or role plays  Summary of Patient Progress: Patient did not attend group despite encouraged participation.    Therapeutic Modalities:   Cognitive Behavioral Therapy Feelings Identification Dialectical Behavioral Therapy  Gwenevere Ghazi, MSW, [place], Bridget Hartshorn 05/28/2020 2:52 PM

## 2020-05-28 NOTE — Progress Notes (Signed)
Patient alert and oriented x 4, affect is blunted, thoughts are organized and coherent, she currently denies SI/HI/AVH and contracts for safety. Patient ws noted isolated to her minimal interaction with peers and staff, she was offered emotional support and encouraged to attend evening wrap up group. Patient ambulates on the unit with a walker, she was complaint with medication regimen, 15 minutes safety checks maintained will continue to closely monitor.

## 2020-05-28 NOTE — Progress Notes (Signed)
Recreation Therapy Notes    Date: 05/28/2020  Time: 9:30 am   Location: Craft room  Behavioral response: N/A   Intervention Topic: Relaxation    Discussion/Intervention: Patient did not attend group.   Clinical Observations/Feedback:  Patient did not attend group.   Seferina Brokaw LRT/CTRS         Nguyen Todorov 05/28/2020 11:39 AM

## 2020-05-29 NOTE — B[facility] Group Notes (Signed)
LCSW Group Therapy Note  05/29/2020 3:01 PM  Type of Therapy/Topic:  Group Therapy:  Balance in Life  Participation Level:  Did Not Attend  Description of Group:    This group will address the concept of balance and how it feels and looks when one is unbalanced. Patients will be encouraged to process areas in their lives that are out of balance and identify reasons for remaining unbalanced. Facilitators will guide patients in utilizing problem-solving interventions to address and correct the stressor making their life unbalanced. Understanding and applying boundaries will be explored and addressed for obtaining and maintaining a balanced life. Patients will be encouraged to explore ways to assertively make their unbalanced needs known to significant others in their lives, using other group members and facilitator for support and feedback.  Therapeutic Goals: 1. Patient will identify two or more emotions or situations they have that consume much of in their lives. 2. Patient will identify signs/triggers that life has become out of balance:  3. Patient will identify two ways to set boundaries in order to achieve balance in their lives:  4. Patient will demonstrate ability to communicate their needs through discussion and/or role plays  Summary of Patient Progress: X  Therapeutic Modalities:   Cognitive Behavioral Therapy Solution-Focused Therapy Assertiveness Training  Simona Huh R. Algis Greenhouse, MSW, LCSW, LCAS 05/29/2020 3:01 PM

## 2020-05-29 NOTE — BHH Counselor (Signed)
CSW reached out to [facility]. Contact was not able to be established, there was no answer or ability to leave voicemail.   Vilma Meckel. Algis Greenhouse, MSW, LCSW, LCAS 05/29/2020 3:53 PM

## 2020-05-29 NOTE — Progress Notes (Signed)
[facility] MD Progress Note  05/29/2020 10:56 AM Rachel Nolan  MRN:  [id number]  Principal Problem: MDD (major depressive disorder), recurrent, severe, with psychosis (HCC) Diagnosis: Principal Problem:   MDD (major depressive disorder), recurrent, severe, with psychosis (HCC) Active Problems:   Post traumatic stress disorder (PTSD)   Cocaine use disorder (HCC)   Substance induced mood disorder (HCC)   Alcohol use disorder, severe, dependence (HCC)   Opioid use disorder, moderate, dependence (HCC)  CC "I'm so-so"  [name] is a 50y.o. female with major depressive disorder, PTSD, and substance use disorder, presenting for worsening mood, suicidal ideations, and auditory hallucinations in settings on relapsing on drugs (cocaine and heroin).   Interval History Patient was seen today for re-evaluation.  Nursing reports no events overnight. The patient has no issues with performing ADLs.  Patient has been medication compliant.    Subjective:  Patent seen at bedside today. She is reading the Bible, and notes that her mood is "so-so". She denies any SI/HI/AH/VH. She has been accepted to [facility], but no beds available. She is still hopeful for admission as she has been unable to stay sober on her own.  Labs: no new results for review.  Total Time spent with patient: 20 minutes  Past Psychiatric History: see H&P  Past Medical History:  Past Medical History:  Diagnosis Date  . Anxiety   . Asthma   . Depression   . Hypertension   . Scoliosis     Past Surgical History:  Procedure Laterality Date  . BACK SURGERY    . ECTOPIC PREGNANCY SURGERY     Family History:  Family History  Problem Relation Age of Onset  . Hypertension Mother   . Hypertension Father    Family Psychiatric  History: see H&P Social History:  Social History   Substance and Sexual Activity  Alcohol Use Yes     Social History   Substance and Sexual Activity  Drug Use Yes  . Types: Marijuana, Cocaine,  "Crack" cocaine    Social History   Socioeconomic History  . Marital status: Widowed    Spouse name: Not on file  . Number of children: Not on file  . Years of education: Not on file  . Highest education level: Not on file  Occupational History  . Not on file  Tobacco Use  . Smoking status: Current Every Day Smoker    Packs/day: 1.00    Types: Cigarettes  . Smokeless tobacco: Never Used  Vaping Use  . Vaping Use: Never used  Substance and Sexual Activity  . Alcohol use: Yes  . Drug use: Yes    Types: Marijuana, Cocaine, "Crack" cocaine  . Sexual activity: Not Currently  Other Topics Concern  . Not on file  Social History Narrative   Pt is homeless, no fixed address; not followed by an outpatient psychiatrist   Social Determinants of Health   Financial Resource Strain: Not on file  Food Insecurity: Not on file  Transportation Needs: Not on file  Physical Activity: Not on file  Stress: Not on file  Social Connections: Not on file   Additional Social History:                         Sleep: Fair  Appetite:  Good  Current Medications: Current Facility-Administered Medications  Medication Dose Route Frequency Provider Last Rate Last Admin  . acetaminophen (TYLENOL) tablet 650 mg  650 mg Oral Q6H PRN Jesse Sans, MD      .  alum & mag hydroxide-simeth (MAALOX/MYLANTA) 200-200-20 MG/5ML suspension 30 mL  30 mL Oral Q4H PRN Jesse Sans, MD      . amLODipine (NORVASC) tablet 10 mg  10 mg Oral Daily Jesse Sans, MD   10 mg at 05/29/20 0801  . benzocaine (ORAJEL) 10 % mucosal gel   Mouth/Throat QID PRN Jesse Sans, MD   1 application at 05/26/20 1637  . dicyclomine (BENTYL) tablet 20 mg  20 mg Oral TID PRN Jesse Sans, MD      . FLUoxetine (PROZAC) capsule 40 mg  40 mg Oral Daily Jesse Sans, MD   40 mg at 05/29/20 0801  . gabapentin (NEURONTIN) capsule 300 mg  300 mg Oral TID Jesse Sans, MD   300 mg at 05/29/20 0801  .  loperamide (IMODIUM) capsule 2 mg  2 mg Oral Q4H PRN Jesse Sans, MD      . magnesium hydroxide (MILK OF MAGNESIA) suspension 30 mL  30 mL Oral Daily PRN Jesse Sans, MD      . methocarbamol (ROBAXIN) tablet 1,000 mg  1,000 mg Oral Q6H PRN Jesse Sans, MD   1,000 mg at 05/23/20 0759  . nicotine (NICODERM CQ - dosed in mg/24 hours) patch 21 mg  21 mg Transdermal Daily Jesse Sans, MD   21 mg at 05/26/20 0806  . ondansetron (ZOFRAN) tablet 4 mg  4 mg Oral Q8H PRN Jesse Sans, MD      . prazosin (MINIPRESS) capsule 3 mg  3 mg Oral QHS Sharen Hones, RPH   3 mg at 05/28/20 2149  . QUEtiapine (SEROQUEL) tablet 400 mg  400 mg Oral QHS Jesse Sans, MD   400 mg at 05/28/20 2149  . sodium chloride (OCEAN) 0.65 % nasal spray 2 spray  2 spray Each Nare Q4H PRN Jesse Sans, MD      . traZODone (DESYREL) tablet 150 mg  150 mg Oral QHS Jesse Sans, MD   150 mg at 05/28/20 2149    Lab Results:  No results found for this or any previous visit (from the past 48 hour(s)).  Blood Alcohol level:  Lab Results  Component Value Date   ETH <10 05/15/2020   ETH <10 04/21/2020    Metabolic Disorder Labs: Lab Results  Component Value Date   HGBA1C 5.5 01/24/2020   MPG 111.15 01/24/2020   MPG 105.41 12/07/2019   No results found for: PROLACTIN Lab Results  Component Value Date   CHOL 150 05/17/2020   TRIG 141 05/17/2020   HDL 58 05/17/2020   CHOLHDL 2.6 05/17/2020   VLDL 28 05/17/2020   LDLCALC 64 05/17/2020   LDLCALC 67 01/24/2020    Physical Findings: AIMS:  , ,  ,  ,    CIWA:    COWS:     Musculoskeletal: Strength & Muscle Tone: within normal limits Gait & Station: normal Patient leans: N/A   Physical Exam: Physical Exam Vitals reviewed.    ROS  Blood pressure 114/77, pulse 96, temperature 98.2 F (36.8 C), temperature source Oral, resp. rate 18, height 5\' 6"  (1.676 m), weight 89.4 kg, last menstrual period 06/19/2018, SpO2 99 %. Body mass  index is 31.8 kg/m.   Psychiatric Specialty Exam:  Presentation  General Appearance: Casual  Eye Contact: fair  Speech:Normal Rate  Speech Volume:Normal  Handedness:Right   Mood and Affect  Mood: "okay"  Affect:Congruent; euthymic   Thought Process  Thought  Processes:Coherent  Descriptions of Associations:Intact  Orientation:Full (Time, Place and Person)  Thought Content:Perseveration  History of Schizophrenia/Schizoaffective disorder:No  Duration of Psychotic Symptoms:Less than six months  Hallucinations: Denies  Ideas of Reference:None  Suicidal Thoughts: Denies  Homicidal Thoughts:Homicidal Thoughts: No   Sensorium  Memory:Immediate Fair  Judgment:Fair  Insight:Fair   Executive Functions  Concentration:Fair  Attention Span:Fair  Recall:Fair  Fund of Knowledge:Fair  Language:Fair   Psychomotor Activity  Psychomotor Activity:Psychomotor Activity: Decreased   Assets  Assets:Communication Skills; Desire for Improvement   Sleep  Sleep:Sleep: Fair, 7.5 hours    Treatment Plan Summary: Daily contact with patient to assess and evaluate symptoms and progress in treatment and Medication management  Patient is a 50 year old female with the above-stated past psychiatric history who is seen in follow-up.  Chart reviewed. Patient discussed with nursing. Patient reports  mood improvement today. No medication changes made today. SW is working on getting her into a SUD treatment facility    Plan:  -continue inpatient psych admission; 15-minute checks; daily contact with patient to assess and evaluate symptoms and progress in treatment; psychoeducation.  -continue scheduled medications: 1) MDD, recurrent, severe with psychotic features- established problem, stable - Seroquel 400 mg nightly for mood and nightmares, fluoxetine 40 mg daily, trazodone 150 mg QHS for insomnia   2) PTSD, chronic- established  problem, stable - Prozac as above, prazosin 3 mg QHS for nightmares  3) Polysubstance abuse (Alcohol, cocaine, heroin, tobacco) - Cessation counseling, nicoderm patch - Patient desires residential treatment -Hepatitis, RPR, and HIV negative  4) HTN- established problem - Amlodipine 10 mg daily  5) Neuropathic pain - Continue gabapentin 300 mg TID  . amLODipine  10 mg Oral Daily  . FLUoxetine  40 mg Oral Daily  . gabapentin  300 mg Oral TID  . nicotine  21 mg Transdermal Daily  . prazosin  3 mg Oral QHS  . QUEtiapine  400 mg Oral QHS  . traZODone  150 mg Oral QHS    -continue PRN medications.  acetaminophen, alum & mag hydroxide-simeth, benzocaine, dicyclomine, loperamide, magnesium hydroxide, methocarbamol, ondansetron, sodium chloride  -Pertinent Labs: no new labs ordered today    -Consults: No new consults placed since yesterday    -Disposition: All necessary aftercare will be arranged prior to discharge. Patient is interested in residential SUD treatment.  -  I certify that the patient does need, on a daily basis, active treatment furnished directly by or requiring the supervision of inpatient psychiatric facility personnel.   05/29/20: Psychiatric exam above reviewed and remains accurate. Assessment and plan above reviewed and updated.      Jesse Sans, MD 05/29/2020, 10:56 AM

## 2020-05-29 NOTE — Progress Notes (Signed)
Patient calm and cooperative during assessment denying SI/HI/AVH. Patient presents with a pleasant affect and was observed interacting appropriately with staff and peers on the unit. Patient compliant with medication administration per MD orders. Pt given education, support, and encouragement to be active in his treatment plan. Patient being monitored Q 15 minutes for safety per unit protocol. Pt remains safe on the unit. 

## 2020-05-29 NOTE — Plan of Care (Signed)
Patient endorses depression   Problem: Education: Goal: Emotional status will improve Outcome: Not Progressing Goal: Mental status will improve Outcome: Not Progressing   

## 2020-05-29 NOTE — Progress Notes (Signed)
Recreation Therapy Notes    Date: 05/29/2020   Time:9:30am   Location: Craft room   Behavioral response: N/A   Intervention Topic: Strengths     Discussion/Intervention: Patient did not attend group.   Clinical Observations/Feedback:  Patient did not attend group.   Geno Sydnor LRT/CTRS          Tylin Stradley 05/29/2020 10:12 AM 

## 2020-05-29 NOTE — Progress Notes (Signed)
Patient is calm and cooperative with assessment. Patient is animated and brightens on approach. Patient denies SI, HI, and AVH, but endorses some depression. She states, "I'll be alright". Patient also endorses some pain but states she does not need pain medication. Patient remains isolative to room. Patient remains safe on the unit at this time and q15 min safety checks have been maintained.

## 2020-05-30 NOTE — Plan of Care (Signed)
Pt rates depression 10/10 and hopelessness 9/10. Pt denies SI, HI and AVH. Pt was educated on care plan and verbalizes understanding. Torrie Mayers RN Problem: Education: Goal: Knowledge of [facility] Education information/materials will improve Outcome: Progressing Goal: Emotional status will improve Outcome: Progressing Goal: Mental status will improve Outcome: Progressing Goal: Verbalization of understanding the information provided will improve Outcome: Progressing   Problem: Activity: Goal: Interest or engagement in activities will improve Outcome: Progressing Goal: Sleeping patterns will improve Outcome: Progressing   Problem: Coping: Goal: Ability to verbalize frustrations and anger appropriately will improve Outcome: Progressing Goal: Ability to demonstrate self-control will improve Outcome: Progressing   Problem: Health Behavior/Discharge Planning: Goal: Identification of resources available to assist in meeting health care needs will improve Outcome: Progressing Goal: Compliance with treatment plan for underlying cause of condition will improve Outcome: Progressing   Problem: Physical Regulation: Goal: Ability to maintain clinical measurements within normal limits will improve Outcome: Progressing   Problem: Education: Goal: Knowledge of disease or condition will improve Outcome: Progressing Goal: Understanding of discharge needs will improve Outcome: Progressing   Problem: Safety: Goal: Periods of time without injury will increase Outcome: Progressing   Problem: Health Behavior/Discharge Planning: Goal: Ability to identify changes in lifestyle to reduce recurrence of condition will improve Outcome: Progressing Goal: Identification of resources available to assist in meeting health care needs will improve Outcome: Progressing   Problem: Physical Regulation: Goal: Complications related to the disease process, condition or treatment will be avoided or  minimized Outcome: Progressing   Problem: Safety: Goal: Ability to remain free from injury will improve Outcome: Progressing   Problem: Education: Goal: Ability to make informed decisions regarding treatment will improve Outcome: Progressing   Problem: Coping: Goal: Coping ability will improve Outcome: Progressing   Problem: Health Behavior/Discharge Planning: Goal: Identification of resources available to assist in meeting health care needs will improve Outcome: Progressing   Problem: Medication: Goal: Compliance with prescribed medication regimen will improve Outcome: Progressing   Problem: Self-Concept: Goal: Ability to disclose and discuss suicidal ideas will improve Outcome: Progressing Goal: Will verbalize positive feelings about self Outcome: Progressing

## 2020-05-30 NOTE — Progress Notes (Signed)
Pt was isolative to her room all day except for meal time. She was not social and did not attend any groups. Pt has no complaints and is calm and cooperative. Torrie Mayers RN

## 2020-05-30 NOTE — Progress Notes (Signed)
[facility] MD Progress Note  05/30/2020 2:31 PM Rachel Nolan  MRN:  [id number]  Principal Problem: MDD (major depressive disorder), recurrent, severe, with psychosis (HCC) Diagnosis: Principal Problem:   MDD (major depressive disorder), recurrent, severe, with psychosis (HCC) Active Problems:   Post traumatic stress disorder (PTSD)   Cocaine use disorder (HCC)   Substance induced mood disorder (HCC)   Alcohol use disorder, severe, dependence (HCC)   Opioid use disorder, moderate, dependence (HCC)  CC "I'm depressed"  Ms.Boza is a 50y.o. female with major depressive disorder, PTSD, and substance use disorder, presenting for worsening mood, suicidal ideations, and auditory hallucinations in settings on relapsing on drugs (cocaine and heroin).   Interval History Patient was seen today for re-evaluation.  Nursing reports no events overnight. The patient has no issues with performing ADLs.  Patient has been medication compliant.    Subjective:  Patent seen at bedside today. She is tearful today reporting feelings of depression. She spoke to her son on the phone, and notes feelings of guilt for not being able to live with him. She notes he keeps begging her to go live with her sister, however sister currently using heroin an this is unsafe environment ofr her or her child. She feels a tremendous amount of guilt for not having a place for him to stay. She denies SI/HI/AH/VH. She remains hopeful to get into residential substance abuse treatment as a stepping point to getting her life back on track for herself and child.  Labs: no new results for review.  Total Time spent with patient: 20 minutes  Past Psychiatric History: see H&P  Past Medical History:  Past Medical History:  Diagnosis Date  . Anxiety   . Asthma   . Depression   . Hypertension   . Scoliosis     Past Surgical History:  Procedure Laterality Date  . BACK SURGERY    . ECTOPIC PREGNANCY SURGERY     Family History:   Family History  Problem Relation Age of Onset  . Hypertension Mother   . Hypertension Father    Family Psychiatric  History: see H&P Social History:  Social History   Substance and Sexual Activity  Alcohol Use Yes     Social History   Substance and Sexual Activity  Drug Use Yes  . Types: Marijuana, Cocaine, "Crack" cocaine    Social History   Socioeconomic History  . Marital status: Widowed    Spouse name: Not on file  . Number of children: Not on file  . Years of education: Not on file  . Highest education level: Not on file  Occupational History  . Not on file  Tobacco Use  . Smoking status: Current Every Day Smoker    Packs/day: 1.00    Types: Cigarettes  . Smokeless tobacco: Never Used  Vaping Use  . Vaping Use: Never used  Substance and Sexual Activity  . Alcohol use: Yes  . Drug use: Yes    Types: Marijuana, Cocaine, "Crack" cocaine  . Sexual activity: Not Currently  Other Topics Concern  . Not on file  Social History Narrative   Pt is homeless, no fixed address; not followed by an outpatient psychiatrist   Social Determinants of Health   Financial Resource Strain: Not on file  Food Insecurity: Not on file  Transportation Needs: Not on file  Physical Activity: Not on file  Stress: Not on file  Social Connections: Not on file   Additional Social History:  Sleep: Fair  Appetite:  Good  Current Medications: Current Facility-Administered Medications  Medication Dose Route Frequency Provider Last Rate Last Admin  . acetaminophen (TYLENOL) tablet 650 mg  650 mg Oral Q6H PRN Jesse Sans, MD   650 mg at 05/30/20 0800  . alum & mag hydroxide-simeth (MAALOX/MYLANTA) 200-200-20 MG/5ML suspension 30 mL  30 mL Oral Q4H PRN Jesse Sans, MD      . amLODipine (NORVASC) tablet 10 mg  10 mg Oral Daily Jesse Sans, MD   10 mg at 05/30/20 0800  . benzocaine (ORAJEL) 10 % mucosal gel   Mouth/Throat QID PRN Jesse Sans, MD   1 application at 05/26/20 1637  . dicyclomine (BENTYL) tablet 20 mg  20 mg Oral TID PRN Jesse Sans, MD      . FLUoxetine (PROZAC) capsule 40 mg  40 mg Oral Daily Jesse Sans, MD   40 mg at 05/30/20 0800  . gabapentin (NEURONTIN) capsule 300 mg  300 mg Oral TID Jesse Sans, MD   300 mg at 05/30/20 1210  . loperamide (IMODIUM) capsule 2 mg  2 mg Oral Q4H PRN Jesse Sans, MD      . magnesium hydroxide (MILK OF MAGNESIA) suspension 30 mL  30 mL Oral Daily PRN Jesse Sans, MD      . methocarbamol (ROBAXIN) tablet 1,000 mg  1,000 mg Oral Q6H PRN Jesse Sans, MD   1,000 mg at 05/23/20 0759  . nicotine (NICODERM CQ - dosed in mg/24 hours) patch 21 mg  21 mg Transdermal Daily Jesse Sans, MD   21 mg at 05/26/20 0806  . ondansetron (ZOFRAN) tablet 4 mg  4 mg Oral Q8H PRN Jesse Sans, MD      . prazosin (MINIPRESS) capsule 3 mg  3 mg Oral QHS Sharen Hones, RPH   3 mg at 05/29/20 2108  . QUEtiapine (SEROQUEL) tablet 400 mg  400 mg Oral QHS Jesse Sans, MD   400 mg at 05/29/20 2108  . sodium chloride (OCEAN) 0.65 % nasal spray 2 spray  2 spray Each Nare Q4H PRN Jesse Sans, MD      . traZODone (DESYREL) tablet 150 mg  150 mg Oral QHS Jesse Sans, MD   150 mg at 05/29/20 2108    Lab Results:  No results found for this or any previous visit (from the past 48 hour(s)).  Blood Alcohol level:  Lab Results  Component Value Date   ETH <10 05/15/2020   ETH <10 04/21/2020    Metabolic Disorder Labs: Lab Results  Component Value Date   HGBA1C 5.5 01/24/2020   MPG 111.15 01/24/2020   MPG 105.41 12/07/2019   No results found for: PROLACTIN Lab Results  Component Value Date   CHOL 150 05/17/2020   TRIG 141 05/17/2020   HDL 58 05/17/2020   CHOLHDL 2.6 05/17/2020   VLDL 28 05/17/2020   LDLCALC 64 05/17/2020   LDLCALC 67 01/24/2020    Physical Findings: AIMS:  , ,  ,  ,    CIWA:    COWS:     Musculoskeletal: Strength &  Muscle Tone: within normal limits Gait & Station: normal Patient leans: N/A   Physical Exam: Physical Exam Vitals reviewed.    ROS  Blood pressure 119/80, pulse (!) 113, temperature 98.2 F (36.8 C), temperature source Oral, resp. rate 18, height 5\' 6"  (1.676 m), weight 89.4 kg, last menstrual period 06/19/2018,  SpO2 90 %. Body mass index is 31.8 kg/m.   Psychiatric Specialty Exam:  Presentation  General Appearance: Casual  Eye Contact: fair  Speech:Normal Rate  Speech Volume:Normal  Handedness:Right   Mood and Affect  Mood: "okay"  Affect:Congruent; euthymic   Thought Process  Thought Processes:Coherent  Descriptions of Associations:Intact  Orientation:Full (Time, Place and Person)  Thought Content:Perseveration  History of Schizophrenia/Schizoaffective disorder:No  Duration of Psychotic Symptoms:Less than six months  Hallucinations: Denies  Ideas of Reference:None  Suicidal Thoughts: Denies  Homicidal Thoughts:Homicidal Thoughts: No   Sensorium  Memory:Immediate Fair  Judgment:Fair  Insight:Fair   Executive Functions  Concentration:Fair  Attention Span:Fair  Recall:Fair  Fund of Knowledge:Fair  Language:Fair   Psychomotor Activity  Psychomotor Activity:Psychomotor Activity: Decreased   Assets  Assets:Communication Skills; Desire for Improvement   Sleep  Sleep:Sleep: Fair, 7.5 hours    Treatment Plan Summary: Daily contact with patient to assess and evaluate symptoms and progress in treatment and Medication management  Patient is a 50 year old female with the above-stated past psychiatric history who is seen in follow-up.  Chart reviewed. Patient discussed with nursing. Patient reports  mood improvement today. No medication changes made today. SW is working on getting her into a SUD treatment facility    Plan:  -continue inpatient psych admission; 15-minute checks; daily contact  with patient to assess and evaluate symptoms and progress in treatment; psychoeducation.  -continue scheduled medications: 1) MDD, recurrent, severe with psychotic features- established problem, stable - Seroquel 400 mg nightly for mood and nightmares, fluoxetine 40 mg daily, trazodone 150 mg QHS for insomnia   2) PTSD, chronic- established problem, stable - Prozac as above, prazosin 3 mg QHS for nightmares  3) Polysubstance abuse (Alcohol, cocaine, heroin, tobacco) - Cessation counseling, nicoderm patch - Patient desires residential treatment -Hepatitis, RPR, and HIV negative  4) HTN- established problem - Amlodipine 10 mg daily  5) Neuropathic pain - Continue gabapentin 300 mg TID  . amLODipine  10 mg Oral Daily  . FLUoxetine  40 mg Oral Daily  . gabapentin  300 mg Oral TID  . nicotine  21 mg Transdermal Daily  . prazosin  3 mg Oral QHS  . QUEtiapine  400 mg Oral QHS  . traZODone  150 mg Oral QHS    -continue PRN medications.  acetaminophen, alum & mag hydroxide-simeth, benzocaine, dicyclomine, loperamide, magnesium hydroxide, methocarbamol, ondansetron, sodium chloride  -Pertinent Labs: no new labs ordered today    -Consults: No new consults placed since yesterday    -Disposition: All necessary aftercare will be arranged prior to discharge. Patient is interested in residential SUD treatment.  -  I certify that the patient does need, on a daily basis, active treatment furnished directly by or requiring the supervision of inpatient psychiatric facility personnel.   05/30/20: Psychiatric exam above reviewed and remains accurate. Assessment and plan above reviewed and updated.   Jesse Sans, MD 05/30/2020, 2:31 PM

## 2020-05-30 NOTE — [facility] Counselor (Signed)
CSW called [facility] and spoke with [name]. She stated that pt does not have a bed available at this time, but one should be available next week. CSW will pursue other housing options for pt due to pt being near ready for discharge.   Hersh Minney [place], MSW, LCSW-A 5/13/20229:49 AM

## 2020-05-30 NOTE — Progress Notes (Signed)
Recreation Therapy Notes   Date: 05/30/2020  Time: 9:30 am   Location: Craft room  Behavioral response: N/A   Intervention Topic: Happiness    Discussion/Intervention: Patient did not attend group.   Clinical Observations/Feedback:  Patient did not attend group.   Amadi Yoshino LRT/CTRS        Verna Hamon 05/30/2020 11:33 AM

## 2020-05-30 NOTE — Plan of Care (Signed)
Patient endorses depression   Problem: Education: Goal: Emotional status will improve Outcome: Not Progressing Goal: Mental status will improve Outcome: Not Progressing

## 2020-05-30 NOTE — Progress Notes (Signed)
Patient calm and cooperative during assessment denying SI/HI/AVH. Patient presents with a pleasant affect and was observed interacting appropriately with staff and peers on the unit. Patient compliant with medication administration per MD orders. Pt given education, support, and encouragement to be active in his treatment plan. Patient being monitored Q 15 minutes for safety per unit protocol. Pt remains safe on the unit.

## 2020-05-30 NOTE — [facility] Group Notes (Signed)
LCSW Group Therapy Note  05/30/2020 2:16 PM  Type of Therapy and Topic:  Group Therapy:  Feelings around Relapse and Recovery  Participation Level:  Did Not Attend   Description of Group:    Patients in this group will discuss emotions they experience before and after a relapse. They will process how experiencing these feelings, or avoidance of experiencing them, relates to having a relapse. Facilitator will guide patients to explore emotions they have related to recovery. Patients will be encouraged to process which emotions are more powerful. They will be guided to discuss the emotional reaction significant others in their lives may have to their relapse or recovery. Patients will be assisted in exploring ways to respond to the emotions of others without this contributing to a relapse.  Therapeutic Goals: 1. Patient will identify two or more emotions that lead to a relapse for them 2. Patient will identify two emotions that result when they relapse 3. Patient will identify two emotions related to recovery 4. Patient will demonstrate ability to communicate their needs through discussion and/or role plays   Summary of Patient Progress: Patient did not attend group despite encouraged participation.     Therapeutic Modalities:   Cognitive Behavioral Therapy Solution-Focused Therapy Assertiveness Training Relapse Prevention Therapy   Gwenevere Ghazi, MSW, [place], [place] 05/30/2020 2:16 PM

## 2020-05-31 NOTE — Progress Notes (Signed)
[facility] MD Progress Note  05/31/2020 12:06 PM Rachel Nolan  MRN:  [id number]  Principal Problem: MDD (major depressive disorder), recurrent, severe, with psychosis (HCC) Diagnosis: Principal Problem:   MDD (major depressive disorder), recurrent, severe, with psychosis (HCC) Active Problems:   Post traumatic stress disorder (PTSD)   Cocaine use disorder (HCC)   Substance induced mood disorder (HCC)   Alcohol use disorder, severe, dependence (HCC)   Opioid use disorder, moderate, dependence (HCC)  CC "I'm so-so"  Rachel Nolan is a 50y.o. female with major depressive disorder, PTSD, and substance use disorder, presenting for worsening mood, suicidal ideations, and auditory hallucinations in settings on relapsing on drugs (cocaine and heroin).   Interval History Patient was seen today for re-evaluation.  Nursing reports no events overnight. The patient has no issues with performing ADLs.  Patient has been medication compliant.    Subjective:  Patent seen at bedside today. She notes she is feeling so-so today. Denies Si/HI/AH/VH. Fair sleep and appetite. No medication side effects. No questions or concerns today. Remains hopeful for residential substance abuse treatment.   Total Time spent with patient: 20 minutes  Past Psychiatric History: see H&P  Past Medical History:  Past Medical History:  Diagnosis Date  . Anxiety   . Asthma   . Depression   . Hypertension   . Scoliosis     Past Surgical History:  Procedure Laterality Date  . BACK SURGERY    . ECTOPIC PREGNANCY SURGERY     Family History:  Family History  Problem Relation Age of Onset  . Hypertension Mother   . Hypertension Father    Family Psychiatric  History: see H&P Social History:  Social History   Substance and Sexual Activity  Alcohol Use Yes     Social History   Substance and Sexual Activity  Drug Use Yes  . Types: Marijuana, Cocaine, "Crack" cocaine    Social History   Socioeconomic History  .  Marital status: Widowed    Spouse name: Not on file  . Number of children: Not on file  . Years of education: Not on file  . Highest education level: Not on file  Occupational History  . Not on file  Tobacco Use  . Smoking status: Current Every Day Smoker    Packs/day: 1.00    Types: Cigarettes  . Smokeless tobacco: Never Used  Vaping Use  . Vaping Use: Never used  Substance and Sexual Activity  . Alcohol use: Yes  . Drug use: Yes    Types: Marijuana, Cocaine, "Crack" cocaine  . Sexual activity: Not Currently  Other Topics Concern  . Not on file  Social History Narrative   Pt is homeless, no fixed address; not followed by an outpatient psychiatrist   Social Determinants of Health   Financial Resource Strain: Not on file  Food Insecurity: Not on file  Transportation Needs: Not on file  Physical Activity: Not on file  Stress: Not on file  Social Connections: Not on file   Additional Social History:                         Sleep: Fair  Appetite:  Good  Current Medications: Current Facility-Administered Medications  Medication Dose Route Frequency Provider Last Rate Last Admin  . acetaminophen (TYLENOL) tablet 650 mg  650 mg Oral Q6H PRN Jesse Sans, MD   650 mg at 05/30/20 0800  . alum & mag hydroxide-simeth (MAALOX/MYLANTA) 200-200-20 MG/5ML suspension 30 mL  30 mL Oral Q4H PRN Jesse Sans, MD      . amLODipine (NORVASC) tablet 10 mg  10 mg Oral Daily Jesse Sans, MD   10 mg at 05/31/20 0751  . benzocaine (ORAJEL) 10 % mucosal gel   Mouth/Throat QID PRN Jesse Sans, MD   1 application at 05/26/20 1637  . dicyclomine (BENTYL) tablet 20 mg  20 mg Oral TID PRN Jesse Sans, MD      . FLUoxetine (PROZAC) capsule 40 mg  40 mg Oral Daily Jesse Sans, MD   40 mg at 05/31/20 0751  . gabapentin (NEURONTIN) capsule 300 mg  300 mg Oral TID Jesse Sans, MD   300 mg at 05/31/20 1154  . loperamide (IMODIUM) capsule 2 mg  2 mg Oral Q4H PRN  Jesse Sans, MD      . magnesium hydroxide (MILK OF MAGNESIA) suspension 30 mL  30 mL Oral Daily PRN Jesse Sans, MD      . methocarbamol (ROBAXIN) tablet 1,000 mg  1,000 mg Oral Q6H PRN Jesse Sans, MD   1,000 mg at 05/23/20 0759  . nicotine (NICODERM CQ - dosed in mg/24 hours) patch 21 mg  21 mg Transdermal Daily Jesse Sans, MD   21 mg at 05/26/20 0806  . ondansetron (ZOFRAN) tablet 4 mg  4 mg Oral Q8H PRN Jesse Sans, MD      . prazosin (MINIPRESS) capsule 3 mg  3 mg Oral QHS Sharen Hones, RPH   3 mg at 05/30/20 2108  . QUEtiapine (SEROQUEL) tablet 400 mg  400 mg Oral QHS Jesse Sans, MD   400 mg at 05/30/20 2107  . sodium chloride (OCEAN) 0.65 % nasal spray 2 spray  2 spray Each Nare Q4H PRN Jesse Sans, MD      . traZODone (DESYREL) tablet 150 mg  150 mg Oral QHS Jesse Sans, MD   150 mg at 05/30/20 2107    Lab Results:  No results found for this or any previous visit (from the past 48 hour(s)).  Blood Alcohol level:  Lab Results  Component Value Date   ETH <10 05/15/2020   ETH <10 04/21/2020    Metabolic Disorder Labs: Lab Results  Component Value Date   HGBA1C 5.5 01/24/2020   MPG 111.15 01/24/2020   MPG 105.41 12/07/2019   No results found for: PROLACTIN Lab Results  Component Value Date   CHOL 150 05/17/2020   TRIG 141 05/17/2020   HDL 58 05/17/2020   CHOLHDL 2.6 05/17/2020   VLDL 28 05/17/2020   LDLCALC 64 05/17/2020   LDLCALC 67 01/24/2020    Physical Findings: AIMS:  , ,  ,  ,    CIWA:    COWS:     Musculoskeletal: Strength & Muscle Tone: within normal limits Gait & Station: normal Patient leans: N/A   Physical Exam: Physical Exam Vitals reviewed.    ROS  Blood pressure 128/90, pulse 93, temperature 98.1 F (36.7 C), temperature source Oral, resp. rate 18, height 5\' 6"  (1.676 m), weight 89.4 kg, last menstrual period 06/19/2018, SpO2 99 %. Body mass index is 31.8 kg/m.   Psychiatric Specialty  Exam:  Presentation  General Appearance: Casual  Eye Contact: fair  Speech:Normal Rate  Speech Volume:Normal  Handedness:Right   Mood and Affect  Mood: "okay"  Affect:Congruent; euthymic   Thought Process  Thought Processes:Coherent  Descriptions of Associations:Intact  Orientation:Full (Time, Place and Person)  Thought Content:Perseveration  History of Schizophrenia/Schizoaffective disorder:No  Duration of Psychotic Symptoms:Less than six months  Hallucinations: Denies  Ideas of Reference:None  Suicidal Thoughts: Denies  Homicidal Thoughts:Homicidal Thoughts: No   Sensorium  Memory:Immediate Fair  Judgment:Fair  Insight:Fair   Executive Functions  Concentration:Fair  Attention Span:Fair  Recall:Fair  Fund of Knowledge:Fair  Language:Fair   Psychomotor Activity  Psychomotor Activity:Psychomotor Activity: Decreased   Assets  Assets:Communication Skills; Desire for Improvement   Sleep  Sleep:Sleep: Fair, 7.5 hours    Treatment Plan Summary: Daily contact with patient to assess and evaluate symptoms and progress in treatment and Medication management  Patient is a 50 year old female with the above-stated past psychiatric history who is seen in follow-up.  Chart reviewed. Patient discussed with nursing. Patient reports  mood improvement today. No medication changes made today. SW is working on getting her into a SUD treatment facility    Plan:  -continue inpatient psych admission; 15-minute checks; daily contact with patient to assess and evaluate symptoms and progress in treatment; psychoeducation.  -continue scheduled medications: 1) MDD, recurrent, severe with psychotic features- established problem, stable - Seroquel 400 mg nightly for mood and nightmares, fluoxetine 40 mg daily, trazodone 150 mg QHS for insomnia   2) PTSD, chronic- established problem, stable - Prozac as above, prazosin 3  mg QHS for nightmares  3) Polysubstance abuse (Alcohol, cocaine, heroin, tobacco) - Cessation counseling, nicoderm patch - Patient desires residential treatment -Hepatitis, RPR, and HIV negative  4) HTN- established problem - Amlodipine 10 mg daily  5) Neuropathic pain - Continue gabapentin 300 mg TID  . amLODipine  10 mg Oral Daily  . FLUoxetine  40 mg Oral Daily  . gabapentin  300 mg Oral TID  . nicotine  21 mg Transdermal Daily  . prazosin  3 mg Oral QHS  . QUEtiapine  400 mg Oral QHS  . traZODone  150 mg Oral QHS    -continue PRN medications.  acetaminophen, alum & mag hydroxide-simeth, benzocaine, dicyclomine, loperamide, magnesium hydroxide, methocarbamol, ondansetron, sodium chloride  -Pertinent Labs: no new labs ordered today    -Consults: No new consults placed since yesterday    -Disposition: All necessary aftercare will be arranged prior to discharge. Patient is interested in residential SUD treatment.  -  I certify that the patient does need, on a daily basis, active treatment furnished directly by or requiring the supervision of inpatient psychiatric facility personnel.   05/31/20: Psychiatric exam above reviewed and remains accurate. Assessment and plan above reviewed and updated.    Jesse Sans, MD 05/31/2020, 12:06 PM

## 2020-05-31 NOTE — Progress Notes (Addendum)
Patient is pleasant and medication compliant. She had no complaints during this shift. Patient denies SI/HI/AVH.

## 2020-05-31 NOTE — Progress Notes (Signed)
Patient is calm and cooperative with assessment. She denies SI, HI, and AVH. Patient also denies pain and any other physical problems. She states she does not have any concerns at this time. Patient is observed to be interacting appropriately with staff and other patients on the unit. She remains medication compliant. Support and encouragement provided. Patient remains safe on the unit at this time and q15 min safety checks are maintained.

## 2020-05-31 NOTE — Progress Notes (Signed)
[facility] Group Notes: (Clinical Social Work)   05/31/2020      Type of Therapy:  Group Therapy   Participation Level:  Did Not Attend - was invited individually by Nurse/MHT and chose not to attend.   Susa Simmonds, LCSWA 05/31/2020  2:20 PM

## 2020-05-31 NOTE — Progress Notes (Signed)
This writer assumed care of patient at 1300. Patient is asleep, showing no signs of distress. Patient remains safe on the unit at this time. Patient will continue to be monitored for safety.

## 2020-05-31 NOTE — Tx Team (Cosign Needed)
Interdisciplinary Treatment and Diagnostic Plan Update  05/31/2020 Time of Session: 10:30 AM  Rachel Nolan MRN: [id number]  Principal Diagnosis: MDD (major depressive disorder), recurrent, severe, with psychosis (HCC)  Secondary Diagnoses: Principal Problem:   MDD (major depressive disorder), recurrent, severe, with psychosis (HCC) Active Problems:   Post traumatic stress disorder (PTSD)   Cocaine use disorder (HCC)   Substance induced mood disorder (HCC)   Alcohol use disorder, severe, dependence (HCC)   Opioid use disorder, moderate, dependence (HCC)   Current Medications:  Current Facility-Administered Medications  Medication Dose Route Frequency Provider Last Rate Last Admin  . acetaminophen (TYLENOL) tablet 650 mg  650 mg Oral Q6H PRN Jesse Sans, MD   650 mg at 05/30/20 0800  . alum & mag hydroxide-simeth (MAALOX/MYLANTA) 200-200-20 MG/5ML suspension 30 mL  30 mL Oral Q4H PRN Jesse Sans, MD      . amLODipine (NORVASC) tablet 10 mg  10 mg Oral Daily Jesse Sans, MD   10 mg at 05/31/20 0751  . benzocaine (ORAJEL) 10 % mucosal gel   Mouth/Throat QID PRN Jesse Sans, MD   1 application at 05/26/20 1637  . dicyclomine (BENTYL) tablet 20 mg  20 mg Oral TID PRN Jesse Sans, MD      . FLUoxetine (PROZAC) capsule 40 mg  40 mg Oral Daily Jesse Sans, MD   40 mg at 05/31/20 0751  . gabapentin (NEURONTIN) capsule 300 mg  300 mg Oral TID Jesse Sans, MD   300 mg at 05/31/20 1154  . loperamide (IMODIUM) capsule 2 mg  2 mg Oral Q4H PRN Jesse Sans, MD      . magnesium hydroxide (MILK OF MAGNESIA) suspension 30 mL  30 mL Oral Daily PRN Jesse Sans, MD      . methocarbamol (ROBAXIN) tablet 1,000 mg  1,000 mg Oral Q6H PRN Jesse Sans, MD   1,000 mg at 05/23/20 0759  . nicotine (NICODERM CQ - dosed in mg/24 hours) patch 21 mg  21 mg Transdermal Daily Jesse Sans, MD   21 mg at 05/26/20 0806  . ondansetron (ZOFRAN) tablet 4 mg  4 mg Oral Q8H  PRN Jesse Sans, MD      . prazosin (MINIPRESS) capsule 3 mg  3 mg Oral QHS Sharen Hones, RPH   3 mg at 05/30/20 2108  . QUEtiapine (SEROQUEL) tablet 400 mg  400 mg Oral QHS Jesse Sans, MD   400 mg at 05/30/20 2107  . sodium chloride (OCEAN) 0.65 % nasal spray 2 spray  2 spray Each Nare Q4H PRN Jesse Sans, MD      . traZODone (DESYREL) tablet 150 mg  150 mg Oral QHS Jesse Sans, MD   150 mg at 05/30/20 2107   PTA Medications: Medications Prior to Admission  Medication Sig Dispense Refill Last Dose  . amLODipine (NORVASC) 10 MG tablet Take 1 tablet (10 mg total) by mouth daily. 30 tablet 1   . amLODipine (NORVASC) 10 MG tablet TAKE 1 TABLET (10 MG TOTAL) BY MOUTH DAILY. 7 tablet 0   . FLUoxetine (PROZAC) 20 MG capsule TAKE 3 CAPSULES (60 MG TOTAL) BY MOUTH DAILY. 21 capsule 0   . FLUoxetine (PROZAC) 40 MG capsule Take 40 mg by mouth daily.     Marland Kitchen gabapentin (NEURONTIN) 300 MG capsule Take 2 capsules (600 mg total) by mouth 3 (three) times daily. (Patient taking differently: Take 300 mg by mouth 3 (  three) times daily.) 180 capsule 1   . gabapentin (NEURONTIN) 300 MG capsule TAKE 2 CAPSULES (600 MG) BY MOUTH 3 TIMES DAILY. 42 capsule 0   . prazosin (MINIPRESS) 1 MG capsule Take 3 capsules (3 mg total) by mouth at bedtime. 90 capsule 1   . prazosin (MINIPRESS) 1 MG capsule TAKE 3 CAPSULES (3 MG) BY MOUTH AT BEDTIME. 21 capsule 0   . QUEtiapine (SEROQUEL) 200 MG tablet TAKE 2 TABLETS (400 MG) BY MOUTH AT BEDTIME. 14 tablet 0   . QUEtiapine (SEROQUEL) 400 MG tablet Take 1 tablet (400 mg total) by mouth at bedtime. 30 tablet 1   . traZODone (DESYREL) 150 MG tablet Take 1 tablet (150 mg total) by mouth at bedtime. 30 tablet 1   . traZODone (DESYREL) 150 MG tablet TAKE 1 TABLET BY MOUTH AT BEDTIME. 7 tablet 0     Patient Stressors: Substance abuse  Patient Strengths: Communication skills  Treatment Modalities: Medication Management, Group therapy, Case management,  1 to 1  session with clinician, Psychoeducation, Recreational therapy.   Physician Treatment Plan for Primary Diagnosis: MDD (major depressive disorder), recurrent, severe, with psychosis (HCC) Long Term Goal(s): Improvement in symptoms so as ready for discharge Improvement in symptoms so as ready for discharge   Short Term Goals: Ability to identify changes in lifestyle to reduce recurrence of condition will improve Ability to verbalize feelings will improve Ability to disclose and discuss suicidal ideas Ability to demonstrate self-control will improve Ability to identify and develop effective coping behaviors will improve Ability to maintain clinical measurements within normal limits will improve Compliance with prescribed medications will improve Ability to identify triggers associated with substance abuse/mental health issues will improve Ability to identify changes in lifestyle to reduce recurrence of condition will improve Ability to verbalize feelings will improve Ability to disclose and discuss suicidal ideas Ability to demonstrate self-control will improve Ability to identify and develop effective coping behaviors will improve Ability to maintain clinical measurements within normal limits will improve Compliance with prescribed medications will improve Ability to identify triggers associated with substance abuse/mental health issues will improve  Medication Management: Evaluate patient's response, side effects, and tolerance of medication regimen.  Therapeutic Interventions: 1 to 1 sessions, Unit Group sessions and Medication administration.  Evaluation of Outcomes: Progressing  Physician Treatment Plan for Secondary Diagnosis: Principal Problem:   MDD (major depressive disorder), recurrent, severe, with psychosis (HCC) Active Problems:   Post traumatic stress disorder (PTSD)   Cocaine use disorder (HCC)   Substance induced mood disorder (HCC)   Alcohol use disorder, severe,  dependence (HCC)   Opioid use disorder, moderate, dependence (HCC)  Long Term Goal(s): Improvement in symptoms so as ready for discharge Improvement in symptoms so as ready for discharge   Short Term Goals: Ability to identify changes in lifestyle to reduce recurrence of condition will improve Ability to verbalize feelings will improve Ability to disclose and discuss suicidal ideas Ability to demonstrate self-control will improve Ability to identify and develop effective coping behaviors will improve Ability to maintain clinical measurements within normal limits will improve Compliance with prescribed medications will improve Ability to identify triggers associated with substance abuse/mental health issues will improve Ability to identify changes in lifestyle to reduce recurrence of condition will improve Ability to verbalize feelings will improve Ability to disclose and discuss suicidal ideas Ability to demonstrate self-control will improve Ability to identify and develop effective coping behaviors will improve Ability to maintain clinical measurements within normal limits will improve Compliance  with prescribed medications will improve Ability to identify triggers associated with substance abuse/mental health issues will improve     Medication Management: Evaluate patient's response, side effects, and tolerance of medication regimen.  Therapeutic Interventions: 1 to 1 sessions, Unit Group sessions and Medication administration.  Evaluation of Outcomes: Progressing   RN Treatment Plan for Primary Diagnosis: MDD (major depressive disorder), recurrent, severe, with psychosis (HCC) Long Term Goal(s): Knowledge of disease and therapeutic regimen to maintain health will improve  Short Term Goals: Ability to remain free from injury will improve, Ability to verbalize frustration and anger appropriately will improve, Ability to demonstrate self-control, Ability to participate in decision  making will improve, Ability to verbalize feelings will improve, Ability to disclose and discuss suicidal ideas, Ability to identify and develop effective coping behaviors will improve and Compliance with prescribed medications will improve  Medication Management: RN will administer medications as ordered by provider, will assess and evaluate patient's response and provide education to patient for prescribed medication. RN will report any adverse and/or side effects to prescribing provider.  Therapeutic Interventions: 1 on 1 counseling sessions, Psychoeducation, Medication administration, Evaluate responses to treatment, Monitor vital signs and CBGs as ordered, Perform/monitor CIWA, COWS, AIMS and Fall Risk screenings as ordered, Perform wound care treatments as ordered.  Evaluation of Outcomes: Progressing   LCSW Treatment Plan for Primary Diagnosis: MDD (major depressive disorder), recurrent, severe, with psychosis (HCC) Long Term Goal(s): Safe transition to appropriate next level of care at discharge, Engage patient in therapeutic group addressing interpersonal concerns.  Short Term Goals: Engage patient in aftercare planning with referrals and resources, Increase social support, Increase ability to appropriately verbalize feelings, Increase emotional regulation, Facilitate acceptance of mental health diagnosis and concerns, Facilitate patient progression through stages of change regarding substance use diagnoses and concerns, Identify triggers associated with mental health/substance abuse issues and Increase skills for wellness and recovery  Therapeutic Interventions: Assess for all discharge needs, 1 to 1 time with Social worker, Explore available resources and support systems, Assess for adequacy in community support network, Educate family and significant other(s) on suicide prevention, Complete Psychosocial Assessment, Interpersonal group therapy.  Evaluation of Outcomes:  Progressing   Progress in Treatment: Attending groups: No. Participating in groups: No. Taking medication as prescribed: Yes. Toleration medication: Yes. Family/Significant other contact made: No, will contact:  Patient declined  Patient understands diagnosis: Yes. Discussing patient identified problems/goals with staff: Yes. Medical problems stabilized or resolved: Yes. Denies suicidal/homicidal ideation: Yes. Issues/concerns per patient self-inventory: No. Other: None   New problem(s) identified: No, Describe:  None   New Short Term/Long Term Goal(s): detox, elimination of symptoms of psychosis, medication management for mood stabilization; elimination of SI thoughts; development of comprehensive mental wellness/sobriety plan. Update 05/21/20: None Update 05/26/20: No changes at this time.  Update 05/31/20: No changes at this time   Patient Goals: "try to find a treatment place and some where for me and my son to stay at" Update 05/21/20: None  Update 05/26/20: No changes at this time.  Update 05/31/20: No changes at this time   Discharge Plan or Barriers:  None identified at this time.  Update 05/21/20: Referrals for inpatient substance use treatment sent out.  Update 05/26/20: Awaiting bed placement from [facility] or confirmation of acceptance at other programs. Update 05/31/20: No changes at this time    Reason for Continuation of Hospitalization: Anxiety Depression Medication stabilization Withdrawal symptoms  Estimated Length of Stay: 2-7 days   Attendees: Patient: 05/31/2020 12:27 PM  Physician: Les Pou, MD 05/31/2020 12:27 PM  Nursing:  05/31/2020 12:27 PM  RN Care Manager: 05/31/2020 12:27 PM  Social Worker: Susa Simmonds, LCSWA 05/31/2020 12:27 PM  Recreational Therapist:  05/31/2020 12:27 PM  Other:  05/31/2020 12:27 PM  Other:  05/31/2020 12:27 PM  Other: 05/31/2020 12:27 PM    Scribe for Treatment Team: Susa Simmonds, LCSWA 05/31/2020 12:27 PM

## 2020-06-01 NOTE — Progress Notes (Signed)
[facility] Group Notes: (Clinical Social Work)   06/01/2020      Type of Therapy:  Group Therapy   Participation Level:  Did Not Attend - was invited individually by Nurse/MHT and chose not to attend.   Susa Simmonds, LCSWA 06/01/2020  2:08 PM

## 2020-06-01 NOTE — Progress Notes (Addendum)
Patient is calm and cooperative. She denies SI, HI, and AVH. She complained of hip pain rated a 5/10. Patient was given Robaxin PRN once on this shift. She is seen interacting appropriately on the unit and remains medication compliant. She remains safe on the unit at this time.

## 2020-06-01 NOTE — Progress Notes (Signed)
Patient is calm and cooperative with assessment. She denies SI, HI, and AVH. Patient is endorsing left hip pain and rates it as a 7/10 when she is walking. Patient was given Robaxin PRN. Patient remains isolative to her room, coming out only for breakfast this morning. Patient remains medication compliant and is safe on the unit at this time.

## 2020-06-01 NOTE — Progress Notes (Signed)
[facility] MD Progress Note  06/01/2020 1:05 PM Rachel Nolan  MRN:  [id number]  Principal Problem: MDD (major depressive disorder), recurrent, severe, with psychosis (HCC) Diagnosis: Principal Problem:   MDD (major depressive disorder), recurrent, severe, with psychosis (HCC) Active Problems:   Post traumatic stress disorder (PTSD)   Cocaine use disorder (HCC)   Substance induced mood disorder (HCC)   Alcohol use disorder, severe, dependence (HCC)   Opioid use disorder, moderate, dependence (HCC)  CC "I'm so-so"  Rachel Nolan is a 50y.o. female with major depressive disorder, PTSD, and substance use disorder, presenting for worsening mood, suicidal ideations, and auditory hallucinations in settings on relapsing on drugs (cocaine and heroin).   Interval History Patient was seen today for re-evaluation.  Nursing reports no events overnight. The patient has no issues with performing ADLs.  Patient has been medication compliant.    Subjective:  Patent seen at bedside today. She denies SI/HI/AH/VH. No medication side effects, no concerns. Aware plan is for DC tmw to either [facility] if bed available, or [facility].   Total Time spent with patient: 20 minutes  Past Psychiatric History: see H&P  Past Medical History:  Past Medical History:  Diagnosis Date  . Anxiety   . Asthma   . Depression   . Hypertension   . Scoliosis     Past Surgical History:  Procedure Laterality Date  . BACK SURGERY    . ECTOPIC PREGNANCY SURGERY     Family History:  Family History  Problem Relation Age of Onset  . Hypertension Mother   . Hypertension Father    Family Psychiatric  History: see H&P Social History:  Social History   Substance and Sexual Activity  Alcohol Use Yes     Social History   Substance and Sexual Activity  Drug Use Yes  . Types: Marijuana, Cocaine, "Crack" cocaine    Social History   Socioeconomic History  . Marital status: Widowed    Spouse name: Not on file   . Number of children: Not on file  . Years of education: Not on file  . Highest education level: Not on file  Occupational History  . Not on file  Tobacco Use  . Smoking status: Current Every Day Smoker    Packs/day: 1.00    Types: Cigarettes  . Smokeless tobacco: Never Used  Vaping Use  . Vaping Use: Never used  Substance and Sexual Activity  . Alcohol use: Yes  . Drug use: Yes    Types: Marijuana, Cocaine, "Crack" cocaine  . Sexual activity: Not Currently  Other Topics Concern  . Not on file  Social History Narrative   Pt is homeless, no fixed address; not followed by an outpatient psychiatrist   Social Determinants of Health   Financial Resource Strain: Not on file  Food Insecurity: Not on file  Transportation Needs: Not on file  Physical Activity: Not on file  Stress: Not on file  Social Connections: Not on file   Additional Social History:                         Sleep: Fair  Appetite:  Good  Current Medications: Current Facility-Administered Medications  Medication Dose Route Frequency Provider Last Rate Last Admin  . acetaminophen (TYLENOL) tablet 650 mg  650 mg Oral Q6H PRN Jesse Sans, MD   650 mg at 05/31/20 1640  . alum & mag hydroxide-simeth (MAALOX/MYLANTA) 200-200-20 MG/5ML suspension 30 mL  30 mL Oral Q4H  PRN Jesse Sans, MD      . amLODipine (NORVASC) tablet 10 mg  10 mg Oral Daily Jesse Sans, MD   10 mg at 06/01/20 0759  . benzocaine (ORAJEL) 10 % mucosal gel   Mouth/Throat QID PRN Jesse Sans, MD   1 application at 05/26/20 1637  . dicyclomine (BENTYL) tablet 20 mg  20 mg Oral TID PRN Jesse Sans, MD      . FLUoxetine (PROZAC) capsule 40 mg  40 mg Oral Daily Jesse Sans, MD   40 mg at 06/01/20 0800  . gabapentin (NEURONTIN) capsule 300 mg  300 mg Oral TID Jesse Sans, MD   300 mg at 06/01/20 1138  . loperamide (IMODIUM) capsule 2 mg  2 mg Oral Q4H PRN Jesse Sans, MD      . magnesium hydroxide  (MILK OF MAGNESIA) suspension 30 mL  30 mL Oral Daily PRN Jesse Sans, MD      . methocarbamol (ROBAXIN) tablet 1,000 mg  1,000 mg Oral Q6H PRN Jesse Sans, MD   1,000 mg at 06/01/20 0258  . nicotine (NICODERM CQ - dosed in mg/24 hours) patch 21 mg  21 mg Transdermal Daily Jesse Sans, MD   21 mg at 05/26/20 0806  . ondansetron (ZOFRAN) tablet 4 mg  4 mg Oral Q8H PRN Jesse Sans, MD      . prazosin (MINIPRESS) capsule 3 mg  3 mg Oral QHS Sharen Hones, RPH   3 mg at 05/31/20 2158  . QUEtiapine (SEROQUEL) tablet 400 mg  400 mg Oral QHS Jesse Sans, MD   400 mg at 05/31/20 2158  . sodium chloride (OCEAN) 0.65 % nasal spray 2 spray  2 spray Each Nare Q4H PRN Jesse Sans, MD   2 spray at 05/31/20 2200  . traZODone (DESYREL) tablet 150 mg  150 mg Oral QHS Jesse Sans, MD   150 mg at 05/31/20 2157    Lab Results:  No results found for this or any previous visit (from the past 48 hour(s)).  Blood Alcohol level:  Lab Results  Component Value Date   ETH <10 05/15/2020   ETH <10 04/21/2020    Metabolic Disorder Labs: Lab Results  Component Value Date   HGBA1C 5.5 01/24/2020   MPG 111.15 01/24/2020   MPG 105.41 12/07/2019   No results found for: PROLACTIN Lab Results  Component Value Date   CHOL 150 05/17/2020   TRIG 141 05/17/2020   HDL 58 05/17/2020   CHOLHDL 2.6 05/17/2020   VLDL 28 05/17/2020   LDLCALC 64 05/17/2020   LDLCALC 67 01/24/2020    Physical Findings: AIMS:  , ,  ,  ,    CIWA:    COWS:     Musculoskeletal: Strength & Muscle Tone: within normal limits Gait & Station: normal Patient leans: N/A   Physical Exam: Physical Exam Vitals reviewed.    ROS  Blood pressure 124/88, pulse (!) 106, temperature 98.2 F (36.8 C), temperature source Oral, resp. rate 18, height 5\' 6"  (1.676 m), weight 89.4 kg, last menstrual period 06/19/2018, SpO2 95 %. Body mass index is 31.8 kg/m.   Psychiatric Specialty Exam:  Presentation   General Appearance: Casual  Eye Contact: fair  Speech:Normal Rate  Speech Volume:Normal  Handedness:Right   Mood and Affect  Mood: "okay"  Affect:Congruent; euthymic   Thought Process  Thought Processes:Coherent  Descriptions of Associations:Intact  Orientation:Full (Time, Place and Person)  Thought Content:Perseveration  History of Schizophrenia/Schizoaffective disorder:No  Duration of Psychotic Symptoms:Less than six months  Hallucinations: Denies  Ideas of Reference:None  Suicidal Thoughts: Denies  Homicidal Thoughts:Homicidal Thoughts: No   Sensorium  Memory:Immediate Fair  Judgment:Fair  Insight:Fair   Executive Functions  Concentration:Fair  Attention Span:Fair  Recall:Fair  Fund of Knowledge:Fair  Language:Fair   Psychomotor Activity  Psychomotor Activity:Psychomotor Activity: Decreased   Assets  Assets:Communication Skills; Desire for Improvement   Sleep  Sleep:Sleep: Fair, 7.5 hours    Treatment Plan Summary: Daily contact with patient to assess and evaluate symptoms and progress in treatment and Medication management  Patient is a 50 year old female with the above-stated past psychiatric history who is seen in follow-up.  Chart reviewed. Patient discussed with nursing. Patient reports  mood improvement today. No medication changes made today. SW is working on getting her into a SUD treatment facility    Plan:  -continue inpatient psych admission; 15-minute checks; daily contact with patient to assess and evaluate symptoms and progress in treatment; psychoeducation.  -continue scheduled medications: 1) MDD, recurrent, severe with psychotic features- established problem, stable - Seroquel 400 mg nightly for mood and nightmares, fluoxetine 40 mg daily, trazodone 150 mg QHS for insomnia   2) PTSD, chronic- established problem, stable - Prozac as above, prazosin 3 mg QHS for  nightmares  3) Polysubstance abuse (Alcohol, cocaine, heroin, tobacco) - Cessation counseling, nicoderm patch - Patient desires residential treatment -Hepatitis, RPR, and HIV negative  4) HTN- established problem - Amlodipine 10 mg daily  5) Neuropathic pain - Continue gabapentin 300 mg TID  . amLODipine  10 mg Oral Daily  . FLUoxetine  40 mg Oral Daily  . gabapentin  300 mg Oral TID  . nicotine  21 mg Transdermal Daily  . prazosin  3 mg Oral QHS  . QUEtiapine  400 mg Oral QHS  . traZODone  150 mg Oral QHS    -continue PRN medications.  acetaminophen, alum & mag hydroxide-simeth, benzocaine, dicyclomine, loperamide, magnesium hydroxide, methocarbamol, ondansetron, sodium chloride  -Pertinent Labs: no new labs ordered today    -Consults: No new consults placed since yesterday    -Disposition: All necessary aftercare will be arranged prior to discharge. Patient is interested in residential SUD treatment.  -  I certify that the patient does need, on a daily basis, active treatment furnished directly by or requiring the supervision of inpatient psychiatric facility personnel.   06/01/20: Psychiatric exam above reviewed and remains accurate. Assessment and plan above reviewed and updated.     Jesse Sans, MD 06/01/2020, 1:05 PM

## 2020-06-02 ENCOUNTER — Other Ambulatory Visit: Payer: Self-pay

## 2020-06-02 MED ORDER — PRAZOSIN HCL 1 MG PO CAPS: 3.0000 mg | ORAL_CAPSULE | Freq: Every day | ORAL | 1 refills | Status: DC

## 2020-06-02 MED ORDER — QUETIAPINE FUMARATE 200 MG PO TABS
400.0000 mg | ORAL_TABLET | Freq: Every day | ORAL | 0 refills | Status: DC
  Filled 2020-06-02: qty 14, 7d supply, fill #0

## 2020-06-02 MED ORDER — TRAZODONE HCL 150 MG PO TABS
150.0000 mg | ORAL_TABLET | Freq: Every day | ORAL | 0 refills | Status: DC
  Filled 2020-06-02: qty 7, 7d supply, fill #0

## 2020-06-02 MED ORDER — AMLODIPINE BESYLATE 10 MG PO TABS: 10.0000 mg | ORAL_TABLET | Freq: Every day | ORAL | 1 refills | Status: DC

## 2020-06-02 MED ORDER — GABAPENTIN 300 MG PO CAPS: 600.0000 mg | ORAL_CAPSULE | Freq: Three times a day (TID) | ORAL | 1 refills | Status: DC

## 2020-06-02 MED ORDER — AMLODIPINE BESYLATE 10 MG PO TABS
10.0000 mg | ORAL_TABLET | Freq: Every day | ORAL | 0 refills | Status: DC
  Filled 2020-06-02: qty 7, 7d supply, fill #0

## 2020-06-02 MED ORDER — FLUOXETINE HCL 40 MG PO CAPS: 40.0000 mg | ORAL_CAPSULE | Freq: Every day | ORAL | 1 refills | Status: DC

## 2020-06-02 MED ORDER — QUETIAPINE FUMARATE 400 MG PO TABS: 400.0000 mg | ORAL_TABLET | Freq: Every day | ORAL | 1 refills | Status: DC

## 2020-06-02 MED ORDER — PRAZOSIN HCL 1 MG PO CAPS
3.0000 mg | ORAL_CAPSULE | Freq: Every day | ORAL | 0 refills | Status: DC
  Filled 2020-06-02: qty 21, 7d supply, fill #0

## 2020-06-02 MED ORDER — FLUOXETINE HCL 40 MG PO CAPS
40.0000 mg | ORAL_CAPSULE | Freq: Every day | ORAL | 0 refills | Status: DC
  Filled 2020-06-02: qty 7, 7d supply, fill #0

## 2020-06-02 MED ORDER — TRAZODONE HCL 150 MG PO TABS: 150.0000 mg | ORAL_TABLET | Freq: Every day | ORAL | 1 refills | Status: DC

## 2020-06-02 NOTE — Progress Notes (Signed)
Recreation Therapy Notes  INPATIENT RECREATION TR PLAN  Patient Details Name: Rachel Nolan MRN: [id number] DOB: 05/14/1970 Today's Date: 06/02/2020  Rec Therapy Plan Is patient appropriate for Therapeutic Recreation?: Yes Treatment times per week: at least 3` Estimated Length of Stay: 5-7 days TR Treatment/Interventions: Group participation (Comment)  Discharge Criteria Pt will be discharged from therapy if:: Discharged Treatment plan/goals/alternatives discussed and agreed upon by:: Patient/family  Discharge Summary Short term goals set: Patient will engage in groups without prompting or encouragement from LRT x3 group sessions within 5 recreation therapy group sessions Short term goals met: Not met Reason goals not met: Patient did not attend any groups Therapeutic equipment acquired: N/A Reason patient discharged from therapy: Discharge from hospital Pt/family agrees with progress & goals achieved: Yes Date patient discharged from therapy: 06/02/20   Clemma Johnsen 06/02/2020, 1:14 PM

## 2020-06-02 NOTE — Discharge Summary (Signed)
Physician Discharge Summary Note  Patient:  Rachel Nolan is an 50 y.o., female MRN:  [id number] DOB:  Dec 07, 1970 Patient phone:  [phone] (home)  Patient address:   [address] [place] [address],  Total Time spent with patient: 35 minutes- 25 minutes face-to-face contact with patient, 10 minutes documentation, coordination of care, scripts   Date of Admission:  05/16/2020 Date of Discharge: 06/02/2020  Reason for Admission:   Patient is a 50 year old female with MDD, PTSD, and polysubstance abuse presenting for worsening depression, command auditory hallucinations, and suicidal ideations in context of recent relapse  Principal Problem: MDD (major depressive disorder), recurrent, severe, with psychosis (HCC) Discharge Diagnoses: Principal Problem:   MDD (major depressive disorder), recurrent, severe, with psychosis (HCC) Active Problems:   Post traumatic stress disorder (PTSD)   Cocaine use disorder (HCC)   Substance induced mood disorder (HCC)   Alcohol use disorder, severe, dependence (HCC)   Opioid use disorder, moderate, dependence (HCC)   Past Psychiatric History: Fiveprevious hospitalizations. History of MDD, PTSD, cocaine use disorder, and benzodiazepine use disorder. Denies ever participating in outpatient treatment or therapy for mental health or substance abuse. Two prior suicide attempts via overdose.Prior history of hospitilizationfor benzodiazepine withdrawals  Past Medical History:  Past Medical History:  Diagnosis Date  . Anxiety   . Asthma   . Depression   . Hypertension   . Scoliosis     Past Surgical History:  Procedure Laterality Date  . BACK SURGERY    . ECTOPIC PREGNANCY SURGERY     Family History:  Family History  Problem Relation Age of Onset  . Hypertension Mother   . Hypertension Father    Family Psychiatric  History: Father and sister with substance use disorders Social History:  Social History   Substance and Sexual Activity   Alcohol Use Yes     Social History   Substance and Sexual Activity  Drug Use Yes  . Types: Marijuana, Cocaine, "Crack" cocaine    Social History   Socioeconomic History  . Marital status: Widowed    Spouse name: Not on file  . Number of children: Not on file  . Years of education: Not on file  . Highest education level: Not on file  Occupational History  . Not on file  Tobacco Use  . Smoking status: Current Every Day Smoker    Packs/day: 1.00    Types: Cigarettes  . Smokeless tobacco: Never Used  Vaping Use  . Vaping Use: Never used  Substance and Sexual Activity  . Alcohol use: Yes  . Drug use: Yes    Types: Marijuana, Cocaine, "Crack" cocaine  . Sexual activity: Not Currently  Other Topics Concern  . Not on file  Social History Narrative   Pt is homeless, no fixed address; not followed by an outpatient psychiatrist   Social Determinants of Health   Financial Resource Strain: Not on file  Food Insecurity: Not on file  Transportation Needs: Not on file  Physical Activity: Not on file  Stress: Not on file  Social Connections: Not on file    Hospital Course:  Patient is a 50 year old female with MDD, PTSD, and polysubstance abuse presenting for worsening depression, command auditory hallucinations, and suicidal ideations in context of recent relapse. She had used injected heroin for the first time. She was checked for acute hepatitis, syphilis, and HIV which all resulted negative. She completed acute detox, and was restarted on her psychiatric medications as below. At time of discharge she denies SI/HI/AH/VH.  Physical Findings: AIMS: Facial and Oral Movements Muscles of Facial Expression: None, normal Lips and Perioral Area: None, normal Jaw: None, normal Tongue: None, normal,Extremity Movements Upper (arms, wrists, hands, fingers): None, normal Lower (legs, knees, ankles, toes): None, normal, Trunk Movements Neck, shoulders, hips: None, normal, Overall  Severity Severity of abnormal movements (highest score from questions above): None, normal Incapacitation due to abnormal movements: None, normal Patient's awareness of abnormal movements (rate only patient's report): No Awareness, Dental Status Current problems with teeth and/or dentures?: No Does patient usually wear dentures?: No  CIWA:    COWS:     Musculoskeletal: Strength & Muscle Tone: within normal limits Gait & Station: normal Patient leans: N/A  Psychiatric Specialty Exam:  Presentation  General Appearance: Casual  Eye Contact:Good  Speech:Normal Rate  Speech Volume:Normal  Handedness:Right   Mood and Affect  Mood:Euthymic  Affect:Congruent   Thought Process  Thought Processes:Goal Directed; Linear  Descriptions of Associations:Intact  Orientation:Full (Time, Place and Person)  Thought Content:Logical  History of Schizophrenia/Schizoaffective disorder:No  Duration of Psychotic Symptoms:Greater than six months  Hallucinations:Hallucinations: None  Ideas of Reference:None  Suicidal Thoughts:Suicidal Thoughts: No  Homicidal Thoughts:Homicidal Thoughts: No   Sensorium  Memory:Immediate Good; Recent Good; Remote Good  Judgment:Intact  Insight:Present   Executive Functions  Concentration:Good  Attention Span:Good  Recall:Good  Fund of Knowledge:Good  Language:Good   Psychomotor Activity  Psychomotor Activity:Psychomotor Activity: Normal   Assets  Assets:Communication Skills; Desire for Improvement; Leisure Time; Resilience; Social Support   Sleep  Sleep:Sleep: Good Number of Hours of Sleep: 8    Physical Exam: Physical Exam Vitals and nursing note reviewed.  Constitutional:      Appearance: Normal appearance.  HENT:     Head: Normocephalic and atraumatic.     Right Ear: External ear normal.     Left Ear: External ear normal.     Nose: Nose normal.     Mouth/Throat:     Mouth: Mucous membranes are moist.      Pharynx: Oropharynx is clear.  Eyes:     Extraocular Movements: Extraocular movements intact.     Conjunctiva/sclera: Conjunctivae normal.     Pupils: Pupils are equal, round, and reactive to light.  Cardiovascular:     Rate and Rhythm: Normal rate.     Pulses: Normal pulses.  Pulmonary:     Effort: Pulmonary effort is normal.  Abdominal:     General: Abdomen is flat.     Palpations: Abdomen is soft.  Musculoskeletal:        General: No swelling. Normal range of motion.     Cervical back: Normal range of motion and neck supple.  Skin:    General: Skin is warm and dry.  Neurological:     General: No focal deficit present.     Mental Status: She is alert and oriented to person, place, and time.  Psychiatric:        Mood and Affect: Mood normal.        Behavior: Behavior normal.        Thought Content: Thought content normal.        Judgment: Judgment normal.    Review of Systems  Constitutional: Negative.   HENT: Negative.   Eyes: Negative.   Respiratory: Negative.   Cardiovascular: Negative.   Gastrointestinal: Negative.   Genitourinary: Negative.   Musculoskeletal: Negative.   Skin: Negative.   Neurological: Negative.   Endo/Heme/Allergies: Negative.   Psychiatric/Behavioral: Negative for depression, hallucinations and suicidal ideas. The patient does  not have insomnia.    Blood pressure 112/79, pulse (!) 101, temperature 98.3 F (36.8 C), temperature source Oral, resp. rate 18, height 5\' 6"  (1.676 m), weight 89.4 kg, last menstrual period 06/19/2018, SpO2 96 %. Body mass index is 31.8 kg/m.   Have you used any form of tobacco in the last 30 days? (Cigarettes, Smokeless Tobacco, Cigars, and/or Pipes): Yes  Has this patient used any form of tobacco in the last 30 days? (Cigarettes, Smokeless Tobacco, Cigars, and/or Pipes)  Yes, A prescription for an FDA-approved tobacco cessation medication was offered at discharge and the patient refused  Blood Alcohol level:  Lab  Results  Component Value Date   [facility] <10 05/15/2020   ETH <10 04/21/2020    Metabolic Disorder Labs:  Lab Results  Component Value Date   HGBA1C 5.5 01/24/2020   MPG 111.15 01/24/2020   MPG 105.41 12/07/2019   No results found for: PROLACTIN Lab Results  Component Value Date   CHOL 150 05/17/2020   TRIG 141 05/17/2020   HDL 58 05/17/2020   CHOLHDL 2.6 05/17/2020   VLDL 28 05/17/2020   LDLCALC 64 05/17/2020   LDLCALC 67 01/24/2020    See Psychiatric Specialty Exam and Suicide Risk Assessment completed by Attending Physician prior to discharge.  Discharge destination:  Other:  03/23/2020  Is patient on multiple antipsychotic therapies at discharge:  No   Has Patient had three or more failed trials of antipsychotic monotherapy by history:  No  Recommended Plan for Multiple Antipsychotic Therapies: NA  Discharge Instructions    Diet - low sodium heart healthy   Complete by: As directed    Increase activity slowly   Complete by: As directed      Allergies as of 06/02/2020   No Known Allergies     Medication List    TAKE these medications     Indication  amLODipine 10 MG tablet Commonly known as: NORVASC Take 1 tablet (10 mg total) by mouth daily. What changed: Another medication with the same name was removed. Continue taking this medication, and follow the directions you see here.  Indication: High Blood Pressure Disorder   FLUoxetine 40 MG capsule Commonly known as: PROZAC Take 1 capsule (40 mg total) by mouth daily. What changed: Another medication with the same name was removed. Continue taking this medication, and follow the directions you see here.  Indication: Depression   gabapentin 300 MG capsule Commonly known as: NEURONTIN Take 2 capsules (600 mg total) by mouth 3 (three) times daily. What changed:   how much to take  Another medication with the same name was removed. Continue taking this medication, and follow the directions you see  here.  Indication: Neuropathic Pain   prazosin 1 MG capsule Commonly known as: MINIPRESS Take 3 capsules (3 mg total) by mouth at bedtime. What changed: Another medication with the same name was removed. Continue taking this medication, and follow the directions you see here.  Indication: Frightening Dreams   QUEtiapine 200 MG tablet Commonly known as: SEROQUEL Take 2 tablets (400 mg total) by mouth at bedtime. What changed:   medication strength  Another medication with the same name was removed. Continue taking this medication, and follow the directions you see here.  Indication: Major Depressive Disorder   traZODone 150 MG tablet Commonly known as: DESYREL Take 1 tablet (150 mg total) by mouth at bedtime. What changed: Another medication with the same name was removed. Continue taking this medication, and follow  the directions you see here.  Indication: Trouble Sleeping        Follow-up recommendations:  Activity:  as tolerated Diet:  low sodium heart healthy diet  Comments:  7-day supply of free medications provided to patient at discharge along with printed 30-day scripts with 1 refill. Recommend repeating HIV testing in 3 months to ensure you are still negative.   Signed: Jesse SansMegan M Amarion Portell, MD 06/02/2020, 10:19 AM

## 2020-06-02 NOTE — Progress Notes (Signed)
  [facility] Adult Case Management Discharge Plan :  Will you be returning to the same living situation after discharge:  No. At discharge, do you have transportation home?: Yes,  safe transport Do you have the ability to pay for your medications: No.  Release of information consent forms completed and in the chart;  Patient's signature needed at discharge.  Patient to Follow up at:  Follow-up Information    [facility] Follow up.   Why: Go to walk in hours to make appointment at 8:30am Monday - Friday.  Contact information: [address] [place], [place] [address] Phone: [phone] Fax: [phone]              Next level of care provider has access to [facility] Link:no  Safety Planning and Suicide Prevention discussed: Yes,  completed with patient  Have you used any form of tobacco in the last 30 days? (Cigarettes, Smokeless Tobacco, Cigars, and/or Pipes): Yes  Has patient been referred to the Quitline?: Patient refused referral  Patient has been referred for addiction treatment: Yes  Jebadiah Imperato A [place], LCSWA 06/02/2020, 10:28 AM
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
## 2020-06-02 NOTE — Progress Notes (Signed)
Patient denies SI/HI, denies A/V hallucinations. Patient verbalizes understanding of discharge instructions, follow up care and prescriptions. Patient given all belongings from [facility] locker. Patient escorted out by staff, transported by safe transport. 

## 2020-06-02 NOTE — [facility] Group Notes (Signed)
LCSW Group Therapy Note     06/02/2020 2:19 PM     Type of Therapy and Topic:  Group Therapy:  Overcoming Obstacles     Participation Level:  Did Not Attend     Description of Group:     In this group patients will be encouraged to explore what they see as obstacles to their own wellness and recovery. They will be guided to discuss their thoughts, feelings, and behaviors related to these obstacles. The group will process together ways to cope with barriers, with attention given to specific choices patients can make. Each patient will be challenged to identify changes they are motivated to make in order to overcome their obstacles. This group will be process-oriented, with patients participating in exploration of their own experiences as well as giving and receiving support and challenge from other group members.     Therapeutic Goals:  1.    Patient will identify personal and current obstacles as they relate to admission.  2.    Patient will identify barriers that currently interfere with their wellness or overcoming obstacles.  3.    Patient will identify feelings, thought process and behaviors related to these barriers.  4.    Patient will identify two changes they are willing to make to overcome these obstacles:    Summary of Patient Progress: X    Therapeutic Modalities:    Cognitive Behavioral Therapy  Solution Focused Therapy  Motivational Interviewing  Relapse Prevention Therapy     Annemarie Sebree [place], MSW, LCSW-A  06/02/2020 2:19 PM

## 2020-06-02 NOTE — Progress Notes (Signed)
Recreation Therapy Notes  Date: 06/02/2020  Time: 9:30 am   Location: Craft room  Behavioral response: N/A   Intervention Topic: Time Management    Discussion/Intervention: Patient did not attend group.   Clinical Observations/Feedback:  Patient did not attend group.   Ladelle Teodoro LRT/CTRS        Rachel Nolan 06/02/2020 11:39 AM

## 2020-06-02 NOTE — Plan of Care (Signed)
  Problem: Group Participation Goal: STG - Patient will engage in groups without prompting or encouragement from LRT x3 group sessions within 5 recreation therapy group sessions Description: STG - Patient will engage in groups without prompting or encouragement from LRT x3 group sessions within 5 recreation therapy group sessions 06/02/2020 1302 by Ernest Haber, LRT Outcome: Not Applicable [id number]/[id number] by Ernest Haber, LRT Outcome: Not Met (add Reason) Note: Patient did not attend any groups.

## 2020-06-24 ENCOUNTER — Other Ambulatory Visit: Payer: Self-pay

## 2020-07-09 ENCOUNTER — Encounter (HOSPITAL_COMMUNITY): Payer: Self-pay | Admitting: Registered Nurse

## 2020-07-09 ENCOUNTER — Ambulatory Visit (HOSPITAL_COMMUNITY)
Admission: EM | Admit: 2020-07-09 | Discharge: 2020-07-10 | Disposition: A | Discharge status: Home / Self Care | Payer: No Payment, Other | Attending: Registered Nurse | Admitting: Registered Nurse

## 2020-07-09 ENCOUNTER — Other Ambulatory Visit: Payer: Self-pay

## 2020-07-09 DIAGNOSIS — F1994 Other psychoactive substance use, unspecified with psychoactive substance-induced mood disorder: Secondary | ICD-10-CM | POA: Diagnosis present

## 2020-07-09 DIAGNOSIS — R45851 Suicidal ideations: Secondary | ICD-10-CM | POA: Insufficient documentation

## 2020-07-09 DIAGNOSIS — F1910 Other psychoactive substance abuse, uncomplicated: Secondary | ICD-10-CM

## 2020-07-09 DIAGNOSIS — Z20822 Contact with and (suspected) exposure to covid-19: Secondary | ICD-10-CM | POA: Diagnosis not present

## 2020-07-09 DIAGNOSIS — Z5900 Homelessness unspecified: Secondary | ICD-10-CM | POA: Insufficient documentation

## 2020-07-09 DIAGNOSIS — Z9151 Personal history of suicidal behavior: Secondary | ICD-10-CM | POA: Insufficient documentation

## 2020-07-09 DIAGNOSIS — F1290 Cannabis use, unspecified, uncomplicated: Secondary | ICD-10-CM | POA: Insufficient documentation

## 2020-07-09 DIAGNOSIS — F1120 Opioid dependence, uncomplicated: Secondary | ICD-10-CM | POA: Insufficient documentation

## 2020-07-09 DIAGNOSIS — F332 Major depressive disorder, recurrent severe without psychotic features: Secondary | ICD-10-CM | POA: Insufficient documentation

## 2020-07-09 DIAGNOSIS — F1410 Cocaine abuse, uncomplicated: Secondary | ICD-10-CM | POA: Diagnosis present

## 2020-07-09 DIAGNOSIS — F1020 Alcohol dependence, uncomplicated: Secondary | ICD-10-CM | POA: Insufficient documentation

## 2020-07-09 DIAGNOSIS — Z79899 Other long term (current) drug therapy: Secondary | ICD-10-CM | POA: Insufficient documentation

## 2020-07-09 DIAGNOSIS — F1911 Other psychoactive substance abuse, in remission: Secondary | ICD-10-CM | POA: Diagnosis not present

## 2020-07-09 DIAGNOSIS — F17210 Nicotine dependence, cigarettes, uncomplicated: Secondary | ICD-10-CM | POA: Diagnosis not present

## 2020-07-09 DIAGNOSIS — F4310 Post-traumatic stress disorder, unspecified: Secondary | ICD-10-CM | POA: Insufficient documentation

## 2020-07-09 DIAGNOSIS — F1490 Cocaine use, unspecified, uncomplicated: Secondary | ICD-10-CM | POA: Diagnosis not present

## 2020-07-09 DIAGNOSIS — F1590 Other stimulant use, unspecified, uncomplicated: Secondary | ICD-10-CM

## 2020-07-09 LAB — URINALYSIS, ROUTINE W REFLEX MICROSCOPIC
Bilirubin Urine: NEGATIVE
Glucose, UA: NEGATIVE mg/dL
Hgb urine dipstick: NEGATIVE
Ketones, ur: 5 mg/dL — AB
Nitrite: NEGATIVE
Protein, ur: NEGATIVE mg/dL
Specific Gravity, Urine: 1.018 (ref 1.005–1.030)
pH: 5 (ref 5.0–8.0)

## 2020-07-09 LAB — LIPID PANEL
Cholesterol: 143 mg/dL (ref 0–200)
HDL: 65 mg/dL (ref >40)
LDL Cholesterol: 60 mg/dL (ref 0–99)
Total CHOL/HDL Ratio: 2.2 RATIO
Triglycerides: 88 mg/dL (ref <150)
VLDL: 18 mg/dL (ref 0–40)

## 2020-07-09 LAB — HEMOGLOBIN A1C
Hgb A1c MFr Bld: 5.5 % (ref 4.8–5.6)
Mean Plasma Glucose: 111.15 mg/dL

## 2020-07-09 LAB — POCT URINE DRUG SCREEN - MANUAL ENTRY (I-SCREEN)
POC Amphetamine UR: NOT DETECTED
POC Buprenorphine (BUP): NOT DETECTED
POC Cocaine UR: POSITIVE — AB
POC Marijuana UR: POSITIVE — AB
POC Methadone UR: NOT DETECTED
POC Methamphetamine UR: NOT DETECTED
POC Morphine: NOT DETECTED
POC Oxazepam (BZO): NOT DETECTED
POC Oxycodone UR: NOT DETECTED
POC Secobarbital (BAR): NOT DETECTED

## 2020-07-09 LAB — CBC WITH DIFFERENTIAL/PLATELET
Abs Immature Granulocytes: 0.01 10*3/uL (ref 0.00–0.07)
Basophils Absolute: 0 10*3/uL (ref 0.0–0.1)
Basophils Relative: 1 %
Eosinophils Absolute: 0.1 10*3/uL (ref 0.0–0.5)
Eosinophils Relative: 1 %
HCT: 40.4 % (ref 36.0–46.0)
Hemoglobin: 13.4 g/dL (ref 12.0–15.0)
Immature Granulocytes: 0 %
Lymphocytes Relative: 35 %
Lymphs Abs: 2.1 10*3/uL (ref 0.7–4.0)
MCH: 31.2 pg (ref 26.0–34.0)
MCHC: 33.2 g/dL (ref 30.0–36.0)
MCV: 94.2 fL (ref 80.0–100.0)
Monocytes Absolute: 0.7 10*3/uL (ref 0.1–1.0)
Monocytes Relative: 11 %
Neutro Abs: 3.2 10*3/uL (ref 1.7–7.7)
Neutrophils Relative %: 52 %
Platelets: 299 10*3/uL (ref 150–400)
RBC: 4.29 MIL/uL (ref 3.87–5.11)
RDW: 16.3 % — ABNORMAL HIGH (ref 11.5–15.5)
WBC: 6.1 10*3/uL (ref 4.0–10.5)
nRBC: 0 % (ref 0.0–0.2)

## 2020-07-09 LAB — POC SARS CORONAVIRUS 2 AG: SARSCOV2ONAVIRUS 2 AG: NEGATIVE

## 2020-07-09 LAB — POC SARS CORONAVIRUS 2 AG -  ED: SARS Coronavirus 2 Ag: NEGATIVE

## 2020-07-09 LAB — RESP PANEL BY RT-PCR (FLU A&B, COVID) ARPGX2
Influenza A by PCR: NEGATIVE
Influenza B by PCR: NEGATIVE
SARS Coronavirus 2 by RT PCR: NEGATIVE

## 2020-07-09 LAB — COMPREHENSIVE METABOLIC PANEL
ALT: 8 U/L (ref 0–44)
AST: 21 U/L (ref 15–41)
Albumin: 3.5 g/dL (ref 3.5–5.0)
Alkaline Phosphatase: 65 U/L (ref 38–126)
Anion gap: 12 (ref 5–15)
BUN: 9 mg/dL (ref 6–20)
CO2: 21 mmol/L — ABNORMAL LOW (ref 22–32)
Calcium: 9.1 mg/dL (ref 8.9–10.3)
Chloride: 106 mmol/L (ref 98–111)
Creatinine, Ser: 0.89 mg/dL (ref 0.44–1.00)
GFR, Estimated: >60 mL/min (ref >60)
Glucose, Bld: 114 mg/dL — ABNORMAL HIGH (ref 70–99)
Potassium: 4.5 mmol/L (ref 3.5–5.1)
Sodium: 139 mmol/L (ref 135–145)
Total Bilirubin: 1.3 mg/dL — ABNORMAL HIGH (ref 0.3–1.2)
Total Protein: 5.7 g/dL — ABNORMAL LOW (ref 6.5–8.1)

## 2020-07-09 LAB — TSH: TSH: 0.808 u[IU]/mL (ref 0.350–4.500)

## 2020-07-09 LAB — MAGNESIUM: Magnesium: 2.2 mg/dL (ref 1.7–2.4)

## 2020-07-09 LAB — ETHANOL: Alcohol, Ethyl (B): <10 mg/dL (ref <10)

## 2020-07-09 MED ORDER — ALUM & MAG HYDROXIDE-SIMETH 200-200-20 MG/5ML PO SUSP: 30.0000 mL | ORAL | Status: DC | PRN

## 2020-07-09 MED ORDER — ONDANSETRON 4 MG PO TBDP: 4.0000 mg | ORAL_TABLET | Freq: Three times a day (TID) | ORAL | Status: DC | PRN

## 2020-07-09 MED ORDER — QUETIAPINE FUMARATE 300 MG PO TABS
300.0000 mg | ORAL_TABLET | Freq: Every day | ORAL | Status: DC
  Administered 2020-07-09: 300 mg via ORAL
  Filled 2020-07-09: qty 1

## 2020-07-09 MED ORDER — ACETAMINOPHEN 325 MG PO TABS: 650.0000 mg | ORAL_TABLET | Freq: Four times a day (QID) | ORAL | Status: DC | PRN

## 2020-07-09 MED ORDER — TRAZODONE HCL 150 MG PO TABS
150.0000 mg | ORAL_TABLET | Freq: Every day | ORAL | Status: DC
  Administered 2020-07-09: 150 mg via ORAL
  Filled 2020-07-09: qty 7
  Filled 2020-07-09: qty 1

## 2020-07-09 MED ORDER — GABAPENTIN 100 MG PO CAPS
200.0000 mg | ORAL_CAPSULE | Freq: Three times a day (TID) | ORAL | Status: DC
  Administered 2020-07-09 – 2020-07-10 (×3): 200 mg via ORAL
  Filled 2020-07-09 (×3): qty 2

## 2020-07-09 MED ORDER — PRAZOSIN HCL 2 MG PO CAPS
3.0000 mg | ORAL_CAPSULE | Freq: Every day | ORAL | Status: DC
  Administered 2020-07-09: 3 mg via ORAL
  Filled 2020-07-09: qty 1

## 2020-07-09 MED ORDER — FLUOXETINE HCL 20 MG PO CAPS
40.0000 mg | ORAL_CAPSULE | Freq: Every day | ORAL | Status: DC
  Administered 2020-07-09 – 2020-07-10 (×2): 40 mg via ORAL
  Filled 2020-07-09: qty 2
  Filled 2020-07-09: qty 14
  Filled 2020-07-09: qty 2

## 2020-07-09 MED ORDER — AMLODIPINE BESYLATE 10 MG PO TABS
10.0000 mg | ORAL_TABLET | Freq: Every day | ORAL | Status: DC
  Administered 2020-07-09 – 2020-07-10 (×2): 10 mg via ORAL
  Filled 2020-07-09: qty 7
  Filled 2020-07-09 (×2): qty 1

## 2020-07-09 MED ORDER — MAGNESIUM HYDROXIDE 400 MG/5ML PO SUSP: 30.0000 mL | Freq: Every day | ORAL | Status: DC | PRN

## 2020-07-09 NOTE — ED Provider Notes (Signed)
Behavioral Health Admission H&P [facility] & OBS)  Date: 07/09/20 Patient Name: Teresha Hanks MRN: [id number] Chief Complaint: Suicidal ideation     Diagnoses:  Final diagnoses:  Polysubstance abuse ([place])  MDD (major depressive disorder), recurrent severe, without psychosis ([place])  Post traumatic stress disorder (PTSD)  Substance induced mood disorder (HCC)  Alcohol use disorder, severe, dependence ([place])  Cocaine use disorder ([place])  Stimulant use disorder  Opioid use disorder, moderate, dependence ([place])  Suicidal ideation    HPI: Delia Heady, 50 y.o., female patient presents to [facility] as a walk in, brought in by ACTT team member with complaints of suicidal ideation and unable to contract for safety.   Patient seen face to face by this provider, consulted with Dr. Hampton Abbot; and chart reviewed on 07/09/20.  On evaluation Rhema Boyett reports she just began ACTT services "Today was the first day he came out and I guess he just found me in a bad way.  He brought me here cause I was having suicidal thoughts.  I was having thoughts of hurting myself."  Patient states her plan was to get her nephew's gun to shoot herself."  Patient states she has been off of her medications for 2-3 days "My medicine help keep me calm and I don't use drugs as much when I take my medicine."  Patient states her stressors are "I'm just tired.  I'm out of meds, I've tried to get help, I'm homeless.  I been staying in abandon buildings.  Today everything just came up."  Patient states she has a history of polysubstance abuse but lately she has only done "crack."  Patient states she has a sister that lives here in [place] "But I can't stay with her cause she still doing heroin and I done stop doing that."  Patient states that she currently doesn't have any transportation or a phone.  Patient reports she has had 2 prior psychiatric hospitalizations this year [facility] in March and [facility] "in April or May." During  evaluation Ruchy Wildrick is sitting up right in chair, restless, scratching at skin; she doesn't appear to be in acute distress.  Patient is alert and oriented x 4.  She is calm and cooperative.  Patient doesn't appear to be responding to internal/external stimuli or delusional thinking.  At this time patient denies homicidal ideation, psychosis, and paranoia.  Patient continues to endorse suicidal ideation with plan to get her nephews gun to shoot herself.  Patient reports prior history of suicide attempt.  Patient is unable to contract for safety stating if she was to leave tonight that she would hurt herself.   Gave permission to speak to Mr. Jenny Reichmann Collateral Information:  Gathered from Mr. Jenny Reichmann ACTT team member:  Mr. Jenny Reichmann reported today was first visit and his main concern was that patient was having suicidal thoughts and unable to contract for safety.  States that patient also informed that she has had nothing to eat in 2 days and has just "been walking around in a daze.  Patient is homeless and has no support."  PHQ 2-9:  Personnel officer Visit from 02/28/2020 in [facility] Erroneous Encounter from 09/11/2019 in [place]  Thoughts that you would be better off dead, or of hurting yourself in some way Several days More than half the days  PHQ-9 Total Score 13 11       Flowsheet Row Admission (Discharged) from 05/16/2020 in [place]  ED from 05/15/2020 in [place] ED from 05/14/2020 in [place] CATEGORY Error: Q7 should not be populated when Q6 is No High Risk High Risk        Total Time spent with patient: 45 minutes  Musculoskeletal  Strength & Muscle Tone: within normal limits Gait & Station: normal.  Patient gait normal with wide stance.  States that she uses a walker "because I'm a high fall risk." Patient leans:  N/A  Psychiatric Specialty Exam  Presentation General Appearance: Appropriate for Environment; Disheveled  Eye Contact:Good  Speech:Clear and Coherent; Normal Rate  Speech Volume:Normal  Handedness:Right   Mood and Affect  Mood:Anxious; Dysphoric  Affect:Congruent   Thought Process  Thought Processes:Coherent; Goal Directed  Descriptions of Associations:Intact  Orientation:Full (Time, Place and Person)  Thought Content:WDL  Diagnosis of Schizophrenia or Schizoaffective disorder in past: No  Duration of Psychotic Symptoms: Greater than six months  Hallucinations:Hallucinations: None  Ideas of Reference:None  Suicidal Thoughts:Suicidal Thoughts: Yes, Active SI Active Intent and/or Plan: With Intent; With Plan; With Means to [place] (I can get my nephews gun)  Homicidal Thoughts:Homicidal Thoughts: No   Sensorium  Memory:Immediate Good; Recent Good  Judgment:Intact  Insight:Present   Executive Functions  Concentration:Good  Attention Span:Good  [place] Knowledge:Good  Language:Good   Psychomotor Activity  Psychomotor Activity:Psychomotor Activity: Restlessness   Assets  Assets:Communication Skills; Desire for Improvement; Resilience   Sleep  Sleep:Sleep: Fair   Nutritional Assessment (For OBS and FBC admissions only) Has the patient had a weight loss or gain of 10 pounds or more in the last 3 months?: No Has the patient had a decrease in food intake/or appetite?: No Does the patient have dental problems?: No Does the patient have eating habits or behaviors that may be indicators of an eating disorder including binging or inducing vomiting?: No Has the patient recently lost weight without trying?: No Has the patient been eating poorly because of a decreased appetite?: No Malnutrition Screening Tool Score: 0   Physical Exam Vitals and nursing note reviewed. Exam conducted with a chaperone present.  Constitutional:       General: She is not in acute distress.    Appearance: Normal appearance. She is obese. She is diaphoretic. She is not ill-appearing.  HENT:     Head: Normocephalic.  Cardiovascular:     Rate and Rhythm: Normal rate.  Pulmonary:     Effort: Pulmonary effort is normal.  Musculoskeletal:        General: Normal range of motion.     Cervical back: Normal range of motion.  Skin:    General: Skin is warm.  Neurological:     Mental Status: She is alert and oriented to person, place, and time.  Psychiatric:        Attention and Perception: Attention and perception normal. She does not perceive auditory or visual hallucinations.        Mood and Affect: Mood is anxious and depressed.        Speech: Speech normal.        Behavior: Behavior normal. Behavior is cooperative.        Thought Content: Thought content is not paranoid or delusional. Thought content includes suicidal ideation. Thought content does not include homicidal ideation. Thought content includes suicidal plan.        Cognition and Memory: Cognition and memory normal.        Judgment: Judgment is  impulsive.   Review of Systems  Constitutional: Negative.   HENT: Negative.    Eyes: Negative.   Respiratory: Negative.    Cardiovascular: Negative.   Gastrointestinal: Negative.   Genitourinary: Negative.   Musculoskeletal: Negative.   Skin: Negative.   Neurological: Negative.   Endo/Heme/Allergies: Negative.   Psychiatric/Behavioral:  Positive for depression, substance abuse and suicidal ideas. Negative for hallucinations. The patient is nervous/anxious.    Blood pressure (!) 121/95, pulse 100, temperature 98.2 F (36.8 C), temperature source Oral, resp. rate 16, last menstrual period 06/19/2018, SpO2 95 %. There is no height or weight on file to calculate BMI.  Past Psychiatric History: See above   Is the patient at risk to self? Yes  Has the patient been a risk to self in the past 6 months? No .    Has the patient been a  risk to self within the distant past? No   Is the patient a risk to others? No   Has the patient been a risk to others in the past 6 months? No   Has the patient been a risk to others within the distant past? No   Past Medical History:  Past Medical History:  Diagnosis Date   Anxiety    Asthma    Depression    Hypertension    Scoliosis     Past Surgical History:  Procedure Laterality Date   BACK SURGERY     ECTOPIC PREGNANCY SURGERY      Family History:  Family History  Problem Relation Age of Onset   Hypertension Mother    Hypertension Father     Social History:  Social History   Socioeconomic History   Marital status: Widowed    Spouse name: Not on file   Number of children: Not on file   Years of education: Not on file   Highest education level: Not on file  Occupational History   Not on file  Tobacco Use   Smoking status: Every Day    Packs/day: 1.00    Pack years: 0.00    Types: Cigarettes   Smokeless tobacco: Never  Vaping Use   Vaping Use: Never used  Substance and Sexual Activity   Alcohol use: Yes   Drug use: Yes    Types: Marijuana, Cocaine, "Crack" cocaine   Sexual activity: Not Currently  Other Topics Concern   Not on file  Social History Narrative   Pt is homeless, no fixed address; not followed by an outpatient psychiatrist   Social Determinants of Health   Financial Resource Strain: Not on file  Food Insecurity: Not on file  Transportation Needs: Not on file  Physical Activity: Not on file  Stress: Not on file  Social Connections: Not on file  Intimate Partner Violence: Not on file    SDOH:  SDOH Screenings   Alcohol Screen: Low Risk    Last Alcohol Screening Score (AUDIT): 1  Depression (PHQ2-9): Medium Risk   PHQ-2 Score: 13  Financial Resource Strain: Not on file  Food Insecurity: Not on file  Housing: Not on file  Physical Activity: Not on file  Social Connections: Not on file  Stress: Not on file  Tobacco Use: High  Risk   Smoking Tobacco Use: Every Day   Smokeless Tobacco Use: Never  Transportation Needs: Not on file    Last Labs:  Admission on 05/16/2020, Discharged on 06/02/2020  Component Date Value Ref Range Status   HIV Screen 4th Generation wRfx 05/16/2020 Non Reactive  Non Reactive Final   Performed at [facility], [place] [address], [place], [place] [address]   RPR Ser Ql 05/16/2020 NON REACTIVE  NON REACTIVE Final   Performed at [facility], [place] [address], [place], [place] [address]   Hepatitis B Surface Ag 05/16/2020 NON REACTIVE  NON REACTIVE Final   HCV Ab 05/16/2020 NON REACTIVE  NON REACTIVE Final   Comment: (NOTE) Nonreactive HCV antibody screen is consistent with no HCV infections,  unless recent infection is suspected or other evidence exists to indicate HCV infection.     Hep A IgM 05/16/2020 NON REACTIVE  NON REACTIVE Final   Hep B C IgM 05/16/2020 NON REACTIVE  NON REACTIVE Final   Performed at [facility], [place] [address], [place], [place] [address]   Cholesterol 05/17/2020 150  0 - 200 mg/dL Final   Triglycerides 05/17/2020 141  <150 mg/dL Final   HDL 05/17/2020 58  >40 mg/dL Final   Total CHOL/HDL Ratio 05/17/2020 2.6  RATIO Final   VLDL 05/17/2020 28  0 - 40 mg/dL Final   LDL Cholesterol 05/17/2020 64  0 - 99 mg/dL Final   Comment:        Total Cholesterol/HDL:CHD Risk Coronary Heart Disease Risk Table                     Men   Women  1/2 Average Risk   3.4   3.3  Average Risk       5.0   4.4  2 X Average Risk   9.6   7.1  3 X Average Risk  23.4   11.0        Use the calculated Patient Ratio above and the CHD Risk Table to determine the patient's CHD Risk.        ATP III CLASSIFICATION (LDL):  <100     mg/dL   Optimal  100-129  mg/dL   Near or Above                    Optimal  130-159  mg/dL   Borderline  160-189  mg/dL   High  >190     mg/dL   Very High Performed at [facility], [place] [place], [place] [address]    Admission on 05/15/2020, Discharged on 05/16/2020  Component Date Value Ref Range Status   Sodium 05/15/2020 136  135 - 145 mmol/L Final   Potassium 05/15/2020 4.3  3.5 - 5.1 mmol/L Final   Chloride 05/15/2020 101  98 - 111 mmol/L Final   CO2 05/15/2020 28  22 - 32 mmol/L Final   Glucose, Bld 05/15/2020 91  70 - 99 mg/dL Final   Glucose reference range applies only to samples taken after fasting for at least 8 hours.   BUN 05/15/2020 12  6 - 20 mg/dL Final   Creatinine, Ser 05/15/2020 0.89  0.44 - 1.00 mg/dL Final   Calcium 05/15/2020 9.1  8.9 - 10.3 mg/dL Final   Total Protein 05/15/2020 7.1  6.5 - 8.1 g/dL Final   Albumin 05/15/2020 3.7  3.5 - 5.0 g/dL Final   AST 05/15/2020 16  15 - 41 U/L Final   ALT 05/15/2020 10  0 - 44 U/L Final   Alkaline Phosphatase 05/15/2020 73  38 - 126 U/L Final   Total Bilirubin 05/15/2020 0.5  0.3 - 1.2 mg/dL Final   GFR, Estimated 05/15/2020 >60  >60 mL/min Final  Comment: (NOTE) Calculated using the CKD-EPI Creatinine Equation (2021)    Anion gap 05/15/2020 7  5 - 15 Final   Performed at [facility], [place] [address], [place], [place] [address]   WBC 05/15/2020 5.6  4.0 - 10.5 K/uL Final   RBC 05/15/2020 4.47  3.87 - 5.11 MIL/uL Final   Hemoglobin 05/15/2020 14.0  12.0 - 15.0 g/dL Final   HCT 05/15/2020 41.9  36.0 - 46.0 % Final   MCV 05/15/2020 93.7  80.0 - 100.0 fL Final   MCH 05/15/2020 31.3  26.0 - 34.0 pg Final   MCHC 05/15/2020 33.4  30.0 - 36.0 g/dL Final   RDW 05/15/2020 15.1  11.5 - 15.5 % Final   Platelets 05/15/2020 347  150 - 400 K/uL Final   nRBC 05/15/2020 0.0  0.0 - 0.2 % Final   Performed at [place] [address] [place], [place] [address]   Alcohol, Ethyl (B) 05/15/2020 <10  <10 mg/dL Final   Comment: (NOTE) Lowest detectable limit for serum alcohol is 10 mg/dL.  For medical purposes only. Performed at [facility], [place] [address], [place], [place] [address]    I-stat hCG, quantitative 05/15/2020  <5.0  <5 mIU/mL Final   Comment 3 05/15/2020          Final   Comment:   GEST. AGE      CONC.  (mIU/mL)   <=1 WEEK        5 - 50     2 WEEKS       50 - 500     3 WEEKS       100 - 10,000     4 WEEKS     1,000 - 30,000        FEMALE AND NON-PREGNANT FEMALE:     LESS THAN 5 mIU/mL    Acetaminophen (Tylenol), Serum 05/15/2020 <10 (A) 10 - 30 ug/mL Final   Comment: (NOTE) Therapeutic concentrations vary significantly. A range of 10-30 ug/mL  may be an effective concentration for many patients. However, some  are best treated at concentrations outside of this range. Acetaminophen concentrations >150 ug/mL at 4 hours after ingestion  and >50 ug/mL at 12 hours after ingestion are often associated with  toxic reactions.  Performed at [facility], [place] [address] [place], [place] [address]    Salicylate Lvl 79/48/0165 <7.0 (A) 7.0 - 30.0 mg/dL Final   Performed at [place] [address], [place], [place] [address]   SARS Coronavirus 2 by RT PCR 05/15/2020 NEGATIVE  NEGATIVE Final   Comment: (NOTE) SARS-CoV-2 target nucleic acids are NOT DETECTED.  The SARS-CoV-2 RNA is generally detectable in upper respiratory specimens during the acute phase of infection. The lowest concentration of SARS-CoV-2 viral copies this assay can detect is 138 copies/mL. A negative result does not preclude SARS-Cov-2 infection and should not be used as the sole basis for treatment or other patient management decisions. A negative result may occur with  improper specimen collection/handling, submission of specimen other than nasopharyngeal swab, presence of viral mutation(s) within the areas targeted by this assay, and inadequate number of viral copies(<138 copies/mL). A negative result must be combined with clinical observations, patient history, and epidemiological information. The expected result is Negative.  Fact Sheet for Patients:  EntrepreneurPulse.com.au  Fact  Sheet for Healthcare Providers:  IncredibleEmployment.be  This test is no  t yet approved or cleared by the [place] and  has been authorized for detection and/or diagnosis of SARS-CoV-2 by FDA under an Emergency Use Authorization (EUA). This EUA will remain  in effect (meaning this test can be used) for the duration of the COVID-19 declaration under Section 564(b)(1) of the Act, 21 U.S.C.section 360bbb-3(b)(1), unless the authorization is terminated  or revoked sooner.       Influenza A by PCR 05/15/2020 NEGATIVE  NEGATIVE Final   Influenza B by PCR 05/15/2020 NEGATIVE  NEGATIVE Final   Comment: (NOTE) The Xpert Xpress SARS-CoV-2/FLU/RSV plus assay is intended as an aid in the diagnosis of influenza from Nasopharyngeal swab specimens and should not be used as a sole basis for treatment. Nasal washings and aspirates are unacceptable for Xpert Xpress SARS-CoV-2/FLU/RSV testing.  Fact Sheet for Patients: EntrepreneurPulse.com.au  Fact Sheet for Healthcare Providers: IncredibleEmployment.be  This test is not yet approved or cleared by the [place] FDA and has been authorized for detection and/or diagnosis of SARS-CoV-2 by FDA under an Emergency Use Authorization (EUA). This EUA will remain in effect (meaning this test can be used) for the duration of the COVID-19 declaration under Section 564(b)(1) of the Act, 21 U.S.C. section 360bbb-3(b)(1), unless the authorization is terminated or revoked.  Performed at [facility], [place] [address], [place], [place] [address]   Admission on 04/23/2020, Discharged on 04/25/2020  Component Date Value Ref Range Status   Color, Urine 04/23/2020 YELLOW  YELLOW Final   APPearance 04/23/2020 CLEAR  CLEAR Final   Specific Gravity, Urine 04/23/2020 1.024  1.005 - 1.030 Final   pH 04/23/2020 6.0  5.0 - 8.0 Final   Glucose, UA 04/23/2020 NEGATIVE   NEGATIVE mg/dL Final   Hgb urine dipstick 04/23/2020 NEGATIVE  NEGATIVE Final   Bilirubin Urine 04/23/2020 NEGATIVE  NEGATIVE Final   Ketones, ur 04/23/2020 NEGATIVE  NEGATIVE mg/dL Final   Protein, ur 04/23/2020 NEGATIVE  NEGATIVE mg/dL Final   Nitrite 04/23/2020 NEGATIVE  NEGATIVE Final   Leukocytes,Ua 04/23/2020 NEGATIVE  NEGATIVE Final   Performed at [place] [address], [place], [place] [address]   Opiates 04/23/2020 NONE DETECTED  NONE DETECTED Final   Cocaine 04/23/2020 POSITIVE (A) NONE DETECTED Final   Benzodiazepines 04/23/2020 NONE DETECTED  NONE DETECTED Final   Amphetamines 04/23/2020 NONE DETECTED  NONE DETECTED Final   Tetrahydrocannabinol 04/23/2020 NONE DETECTED  NONE DETECTED Final   Barbiturates 04/23/2020 NONE DETECTED  NONE DETECTED Final   Comment: (NOTE) DRUG SCREEN FOR MEDICAL PURPOSES ONLY.  IF CONFIRMATION IS NEEDED FOR ANY PURPOSE, NOTIFY LAB WITHIN 5 DAYS.  LOWEST DETECTABLE LIMITS FOR URINE DRUG SCREEN Drug Class                     Cutoff (ng/mL) Amphetamine and metabolites    1000 Barbiturate and metabolites    200 Benzodiazepine                 174 Tricyclics and metabolites     300 Opiates and metabolites        300 Cocaine and metabolites        300 THC                            50 Performed at [facility], [place] [address] [place], [place] [address]    SARS Coronavirus 2 by RT PCR 04/23/2020 NEGATIVE  NEGATIVE  Final   Comment: (NOTE) SARS-CoV-2 target nucleic acids are NOT DETECTED.  The SARS-CoV-2 RNA is generally detectable in upper respiratory specimens during the acute phase of infection. The lowest concentration of SARS-CoV-2 viral copies this assay can detect is 138 copies/mL. A negative result does not preclude SARS-Cov-2 infection and should not be used as the sole basis for treatment or other patient management decisions. A negative result may occur with  improper specimen  collection/handling, submission of specimen other than nasopharyngeal swab, presence of viral mutation(s) within the areas targeted by this assay, and inadequate number of viral copies(<138 copies/mL). A negative result must be combined with clinical observations, patient history, and epidemiological information. The expected result is Negative.  Fact Sheet for Patients:  EntrepreneurPulse.com.au  Fact Sheet for Healthcare Providers:  IncredibleEmployment.be  This test is no                          t yet approved or cleared by the [place] FDA and  has been authorized for detection and/or diagnosis of SARS-CoV-2 by FDA under an Emergency Use Authorization (EUA). This EUA will remain  in effect (meaning this test can be used) for the duration of the COVID-19 declaration under Section 564(b)(1) of the Act, 21 U.S.C.section 360bbb-3(b)(1), unless the authorization is terminated  or revoked sooner.       Influenza A by PCR 04/23/2020 NEGATIVE  NEGATIVE Final   Influenza B by PCR 04/23/2020 NEGATIVE  NEGATIVE Final   Comment: (NOTE) The Xpert Xpress SARS-CoV-2/FLU/RSV plus assay is intended as an aid in the diagnosis of influenza from Nasopharyngeal swab specimens and should not be used as a sole basis for treatment. Nasal washings and aspirates are unacceptable for Xpert Xpress SARS-CoV-2/FLU/RSV testing.  Fact Sheet for Patients: EntrepreneurPulse.com.au  Fact Sheet for Healthcare Providers: IncredibleEmployment.be  This test is not yet approved or cleared by the [place] FDA and has been authorized for detection and/or diagnosis of SARS-CoV-2 by FDA under an Emergency Use Authorization (EUA). This EUA will remain in effect (meaning this test can be used) for the duration of the COVID-19 declaration under Section 564(b)(1) of the Act, 21 U.S.C. section 360bbb-3(b)(1), unless the authorization is  terminated or revoked.  Performed at [facility], [place] [address] [place], [place] [address]   Admission on 04/21/2020, Discharged on 04/22/2020  Component Date Value Ref Range Status   Sodium 04/21/2020 143  135 - 145 mmol/L Final   Potassium 04/21/2020 4.0  3.5 - 5.1 mmol/L Final   Chloride 04/21/2020 108  98 - 111 mmol/L Final   CO2 04/21/2020 27  22 - 32 mmol/L Final   Glucose, Bld 04/21/2020 105 (A) 70 - 99 mg/dL Final   Glucose reference range applies only to samples taken after fasting for at least 8 hours.   BUN 04/21/2020 14  6 - 20 mg/dL Final   Creatinine, Ser 04/21/2020 0.80  0.44 - 1.00 mg/dL Final   Calcium 04/21/2020 9.5  8.9 - 10.3 mg/dL Final   Total Protein 04/21/2020 7.9  6.5 - 8.1 g/dL Final   Albumin 04/21/2020 3.8  3.5 - 5.0 g/dL Final   AST 04/21/2020 16  15 - 41 U/L Final   ALT 04/21/2020 14  0 - 44 U/L Final   Alkaline Phosphatase 04/21/2020 77  38 - 126 U/L Final   Total Bilirubin 04/21/2020 0.3  0.3 - 1.2 mg/dL Final   GFR, Estimated 04/21/2020 >60  >  60 mL/min Final   Comment: (NOTE) Calculated using the CKD-EPI Creatinine Equation (2021)    Anion gap 04/21/2020 8  5 - 15 Final   Performed at [facility], [place] [address], [place], [place] [address]   Alcohol, Ethyl (B) 04/21/2020 <10  <10 mg/dL Final   Comment: (NOTE) Lowest detectable limit for serum alcohol is 10 mg/dL.  For medical purposes only. Performed at [facility], [place] [address], [place], [place] [address]    Salicylate Lvl 27/74/1287 <7.0 (A) 7.0 - 30.0 mg/dL Final   Performed at [place] [address], [place], [place] [address]   Acetaminophen (Tylenol), Serum 04/21/2020 <10 (A) 10 - 30 ug/mL Final   Comment: (NOTE) Therapeutic concentrations vary significantly. A range of 10-30 ug/mL  may be an effective concentration for many patients. However, some  are best treated at concentrations outside of  this range. Acetaminophen concentrations >150 ug/mL at 4 hours after ingestion  and >50 ug/mL at 12 hours after ingestion are often associated with  toxic reactions.  Performed at [facility], [place] [address] [place], [place] [address]    WBC 04/21/2020 6.3  4.0 - 10.5 K/uL Final   RBC 04/21/2020 3.87  3.87 - 5.11 MIL/uL Final   Hemoglobin 04/21/2020 12.2  12.0 - 15.0 g/dL Final   HCT 04/21/2020 36.4  36.0 - 46.0 % Final   MCV 04/21/2020 94.1  80.0 - 100.0 fL Final   MCH 04/21/2020 31.5  26.0 - 34.0 pg Final   MCHC 04/21/2020 33.5  30.0 - 36.0 g/dL Final   RDW 04/21/2020 14.6  11.5 - 15.5 % Final   Platelets 04/21/2020 280  150 - 400 K/uL Final   nRBC 04/21/2020 0.0  0.0 - 0.2 % Final   Performed at [facility], [place] [address], [place], [place] [address]  Admission on 03/25/2020, Discharged on 04/11/2020  Component Date Value Ref Range Status   WBC 04/07/2020 5.3  4.0 - 10.5 K/uL Final   RBC 04/07/2020 3.63 (A) 3.87 - 5.11 MIL/uL Final   Hemoglobin 04/07/2020 11.5 (A) 12.0 - 15.0 g/dL Final   HCT 04/07/2020 34.4 (A) 36.0 - 46.0 % Final   MCV 04/07/2020 94.8  80.0 - 100.0 fL Final   MCH 04/07/2020 31.7  26.0 - 34.0 pg Final   MCHC 04/07/2020 33.4  30.0 - 36.0 g/dL Final   RDW 04/07/2020 14.8  11.5 - 15.5 % Final   Platelets 04/07/2020 218  150 - 400 K/uL Final   nRBC 04/07/2020 0.0  0.0 - 0.2 % Final   Neutrophils Relative % 04/07/2020 51  % Final   Neutro Abs 04/07/2020 2.7  1.7 - 7.7 K/uL Final   Lymphocytes Relative 04/07/2020 35  % Final   Lymphs Abs 04/07/2020 1.8  0.7 - 4.0 K/uL Final   Monocytes Relative 04/07/2020 13  % Final   Monocytes Absolute 04/07/2020 0.7  0.1 - 1.0 K/uL Final   Eosinophils Relative 04/07/2020 1  % Final   Eosinophils Absolute 04/07/2020 0.1  0.0 - 0.5 K/uL Final   Basophils Relative 04/07/2020 0  % Final   Basophils Absolute 04/07/2020 0.0  0.0 - 0.1 K/uL Final   Immature Granulocytes 04/07/2020 0  % Final    Abs Immature Granulocytes 04/07/2020 0.02  0.00 - 0.07 K/uL Final   Performed at [facility], [address] [place], [place] [address]   Prothrombin Time 04/07/2020 12.4  11.4 -  15.2 seconds Final   INR 04/07/2020 1.0  0.8 - 1.2 Final   Comment: (NOTE) INR goal varies based on device and disease states. Performed at [facility], [address] [place], [place] [address]    aPTT 04/07/2020 31  24 - 36 seconds Final   Performed at [facility], [place], [place] [address]   Factor II Activity 04/07/2020 114  50 - 154 % Final   Comment: (NOTE) Performed At: [facility] [place], [place] [id number] Rush Farmer MD DS:[id number]    Sodium 04/07/2020 136  135 - 145 mmol/L Final   Potassium 04/07/2020 4.0  3.5 - 5.1 mmol/L Final   HEMOLYSIS AT THIS LEVEL MAY AFFECT RESULT   Chloride 04/07/2020 102  98 - 111 mmol/L Final   CO2 04/07/2020 28  22 - 32 mmol/L Final   Glucose, Bld 04/07/2020 119 (A) 70 - 99 mg/dL Final   Glucose reference range applies only to samples taken after fasting for at least 8 hours.   BUN 04/07/2020 15  6 - 20 mg/dL Final   Creatinine, Ser 04/07/2020 0.78  0.44 - 1.00 mg/dL Final   Calcium 04/07/2020 8.7 (A) 8.9 - 10.3 mg/dL Final   Total Protein 04/07/2020 6.8  6.5 - 8.1 g/dL Final   Albumin 04/07/2020 3.4 (A) 3.5 - 5.0 g/dL Final   AST 04/07/2020 58 (A) 15 - 41 U/L Final   ALT 04/07/2020 77 (A) 0 - 44 U/L Final   Alkaline Phosphatase 04/07/2020 57  38 - 126 U/L Final   Total Bilirubin 04/07/2020 0.5  0.3 - 1.2 mg/dL Final   GFR, Estimated 04/07/2020 >60  >60 mL/min Final   Comment: (NOTE) Calculated using the CKD-EPI Creatinine Equation (2021)    Anion gap 04/07/2020 6  5 - 15 Final   Performed at [facility], [place], [place] [address]   D-Dimer, Quant 04/07/2020 0.67 (A) 0.00 - 0.50 ug/mL-FEU Final   Comment: (NOTE) At the manufacturer cut-off value of  0.5 g/mL FEU, this assay has a negative predictive value of 95-100%.This assay is intended for use in conjunction with a clinical pretest probability (PTP) assessment model to exclude pulmonary embolism (PE) and deep venous thrombosis (DVT) in outpatients suspected of PE or DVT. Results should be correlated with clinical presentation. Performed at [facility], [address] [place], [place] [address]    Fibrinogen 04/07/2020 269  210 - 475 mg/dL Final   Performed at [facility], [address] [place], [place] [address]  Admission on 03/24/2020, Discharged on 03/25/2020  Component Date Value Ref Range Status   Sodium 03/24/2020 141  135 - 145 mmol/L Final   Potassium 03/24/2020 3.9  3.5 - 5.1 mmol/L Final   Chloride 03/24/2020 111  98 - 111 mmol/L Final   CO2 03/24/2020 25  22 - 32 mmol/L Final   Glucose, Bld 03/24/2020 145 (A) 70 - 99 mg/dL Final   Glucose reference range applies only to samples taken after fasting for at least 8 hours.   BUN 03/24/2020 17  6 - 20 mg/dL Final   Creatinine, Ser 03/24/2020 0.86  0.44 - 1.00 mg/dL Final   Calcium 03/24/2020 9.0  8.9 - 10.3 mg/dL Final   Total Protein 03/24/2020 7.3  6.5 - 8.1 g/dL Final   Albumin 03/24/2020 3.7  3.5 - 5.0 g/dL Final   AST 03/24/2020 15  15 - 41 U/L Final   ALT 03/24/2020  10  0 - 44 U/L Final   Alkaline Phosphatase 03/24/2020 64  38 - 126 U/L Final   Total Bilirubin 03/24/2020 0.3  0.3 - 1.2 mg/dL Final   GFR, Estimated 03/24/2020 >60  >60 mL/min Final   Comment: (NOTE) Calculated using the CKD-EPI Creatinine Equation (2021)    Anion gap 03/24/2020 5  5 - 15 Final   Performed at [facility], [place], [place] [address]   Alcohol, Ethyl (B) 03/24/2020 <10  <10 mg/dL Final   Comment: (NOTE) Lowest detectable limit for serum alcohol is 10 mg/dL.  For medical purposes only. Performed at [facility], [place], [place] [address]     Salicylate Lvl 85/88/5027 <7.0 (A) 7.0 - 30.0 mg/dL Final   Performed at [facility], [place], [place] [address]   Acetaminophen (Tylenol), Serum 03/24/2020 <10 (A) 10 - 30 ug/mL Final   Comment: (NOTE) Therapeutic concentrations vary significantly. A range of 10-30 ug/mL  may be an effective concentration for many patients. However, some  are best treated at concentrations outside of this range. Acetaminophen concentrations >150 ug/mL at 4 hours after ingestion  and >50 ug/mL at 12 hours after ingestion are often associated with  toxic reactions.  Performed at [facility], [address] [place], [place] [address]    WBC 03/24/2020 4.5  4.0 - 10.5 K/uL Final   RBC 03/24/2020 4.19  3.87 - 5.11 MIL/uL Final   Hemoglobin 03/24/2020 13.2  12.0 - 15.0 g/dL Final   HCT 03/24/2020 39.4  36.0 - 46.0 % Final   MCV 03/24/2020 94.0  80.0 - 100.0 fL Final   MCH 03/24/2020 31.5  26.0 - 34.0 pg Final   MCHC 03/24/2020 33.5  30.0 - 36.0 g/dL Final   RDW 03/24/2020 14.9  11.5 - 15.5 % Final   Platelets 03/24/2020 246  150 - 400 K/uL Final   nRBC 03/24/2020 0.0  0.0 - 0.2 % Final   Performed at [facility], [address] [place], [place] [address]   SARS Coronavirus 2 by RT PCR 03/24/2020 NEGATIVE  NEGATIVE Final   Comment: (NOTE) SARS-CoV-2 target nucleic acids are NOT DETECTED.  The SARS-CoV-2 RNA is generally detectable in upper respiratory specimens during the acute phase of infection. The lowest concentration of SARS-CoV-2 viral copies this assay can detect is 138 copies/mL. A negative result does not preclude SARS-Cov-2 infection and should not be used as the sole basis for treatment or other patient management decisions. A negative result may occur with  improper specimen collection/handling, submission of specimen other than nasopharyngeal swab, presence of viral mutation(s) within the areas targeted by this assay, and inadequate number  of viral copies(<138 copies/mL). A negative result must be combined with clinical observations, patient history, and epidemiological information. The expected result is Negative.  Fact Sheet for Patients:  EntrepreneurPulse.com.au  Fact Sheet for Healthcare Providers:  IncredibleEmployment.be  This test is no                          t yet approved or cleared by the [place] FDA and  has been authorized for detection and/or diagnosis of SARS-CoV-2 by FDA under an Emergency Use Authorization (EUA). This EUA will remain  in effect (meaning this test can be used) for the duration of the COVID-19 declaration under Section 564(b)(1) of the Act, 21 U.S.C.section 360bbb-3(b)(1), unless the authorization is terminated  or revoked sooner.       Influenza A by PCR 03/24/2020 NEGATIVE  NEGATIVE Final   Influenza B by PCR 03/24/2020 NEGATIVE  NEGATIVE Final   Comment: (NOTE) The Xpert Xpress SARS-CoV-2/FLU/RSV plus assay is intended as an aid in the diagnosis of influenza from Nasopharyngeal swab specimens and should not be used as a sole basis for treatment. Nasal washings and aspirates are unacceptable for Xpert Xpress SARS-CoV-2/FLU/RSV testing.  Fact Sheet for Patients: EntrepreneurPulse.com.au  Fact Sheet for Healthcare Providers: IncredibleEmployment.be  This test is not yet approved or cleared by the [place] FDA and has been authorized for detection and/or diagnosis of SARS-CoV-2 by FDA under an Emergency Use Authorization (EUA). This EUA will remain in effect (meaning this test can be used) for the duration of the COVID-19 declaration under Section 564(b)(1) of the Act, 21 U.S.C. section 360bbb-3(b)(1), unless the authorization is terminated or revoked.  Performed at [facility], [address] [place], [place] [address]   Admission on 01/23/2020, Discharged on 01/29/2020   Component Date Value Ref Range Status   Hgb A1c MFr Bld 01/24/2020 5.5  4.8 - 5.6 % Final   Comment: (NOTE) Pre diabetes:          5.7%-6.4%  Diabetes:              >6.4%  Glycemic control for   <7.0% adults with diabetes    Mean Plasma Glucose 01/24/2020 111.15  mg/dL Final   Performed at [place] [address], [place], [place] [address]   Cholesterol 01/24/2020 148  0 - 200 mg/dL Final   Triglycerides 01/24/2020 72  <150 mg/dL Final   HDL 01/24/2020 67  >40 mg/dL Final   Total CHOL/HDL Ratio 01/24/2020 2.2  RATIO Final   VLDL 01/24/2020 14  0 - 40 mg/dL Final   LDL Cholesterol 01/24/2020 67  0 - 99 mg/dL Final   Comment:        Total Cholesterol/HDL:CHD Risk Coronary Heart Disease Risk Table                     Men   Women  1/2 Average Risk   3.4   3.3  Average Risk       5.0   4.4  2 X Average Risk   9.6   7.1  3 X Average Risk  23.4   11.0        Use the calculated Patient Ratio above and the CHD Risk Table to determine the patient's CHD Risk.        ATP III CLASSIFICATION (LDL):  <100     mg/dL   Optimal  100-129  mg/dL   Near or Above                    Optimal  130-159  mg/dL   Borderline  160-189  mg/dL   High  >190     mg/dL   Very High Performed at [facility] [address], [place], [place] [address]    TSH 01/24/2020 0.783  0.350 - 4.500 uIU/mL Final   Comment: Performed by a 3rd Generation assay with a functional sensitivity of <=0.01 uIU/mL. Performed at [facility], [place] [address], [place], [place] [address]    TSH 01/25/2020 1.232  0.350 - 4.500 uIU/mL Final   Comment: Performed by a 3rd Generation assay with a functional sensitivity of <=0.01 uIU/mL. Performed at [facility], [place] [address], [place], [place] [address]    Sodium 01/25/2020 139  135 - 145 mmol/L Final   Potassium 01/25/2020 4.1  3.5 - 5.1 mmol/L Final   Chloride 01/25/2020 105  98 - 111 mmol/L Final   CO2  01/25/2020 26  22 - 32 mmol/L Final   Glucose, Bld 01/25/2020 117 (A) 70 - 99 mg/dL Final   Glucose reference range applies only to samples taken after fasting for at least 8 hours.   BUN 01/25/2020 13  6 - 20 mg/dL Final   Creatinine, Ser 01/25/2020 0.72  0.44 - 1.00 mg/dL Final   Calcium 01/25/2020 9.0  8.9 - 10.3 mg/dL Final   Total Protein 01/25/2020 7.0  6.5 - 8.1 g/dL Final   Albumin 01/25/2020 3.6  3.5 - 5.0 g/dL Final   AST 01/25/2020 14 (A) 15 - 41 U/L Final   ALT 01/25/2020 10  0 - 44 U/L Final   Alkaline Phosphatase 01/25/2020 69  38 - 126 U/L Final   Total Bilirubin 01/25/2020 0.4  0.3 - 1.2 mg/dL Final   GFR, Estimated 01/25/2020 >60  >60 mL/min Final   Comment: (NOTE) Calculated using the CKD-EPI Creatinine Equation (2021)    Anion gap 01/25/2020 8  5 - 15 Final   Performed at [facility], [place] [address] [place], [place] [address]   SARS Coronavirus 2 by RT PCR 01/28/2020 NEGATIVE  NEGATIVE Final   Comment: (NOTE) SARS-CoV-2 target nucleic acids are NOT DETECTED.  The SARS-CoV-2 RNA is generally detectable in upper respiratory specimens during the acute phase of infection. The lowest concentration of SARS-CoV-2 viral copies this assay can detect is 138 copies/mL. A negative result does not preclude SARS-Cov-2 infection and should not be used as the sole basis for treatment or other patient management decisions. A negative result may occur with  improper specimen collection/handling, submission of specimen other than nasopharyngeal swab, presence of viral mutation(s) within the areas targeted by this assay, and inadequate number of viral copies(<138 copies/mL). A negative result must be combined with clinical observations, patient history, and epidemiological information. The expected result is Negative.  Fact Sheet for Patients:  EntrepreneurPulse.com.au  Fact Sheet for Healthcare Providers:   IncredibleEmployment.be  This test is no                          t yet approved or cleared by the [place] FDA and  has been authorized for detection and/or diagnosis of SARS-CoV-2 by FDA under an Emergency Use Authorization (EUA). This EUA will remain  in effect (meaning this test can be used) for the duration of the COVID-19 declaration under Section 564(b)(1) of the Act, 21 U.S.C.section 360bbb-3(b)(1), unless the authorization is terminated  or revoked sooner.       Influenza A by PCR 01/28/2020 NEGATIVE  NEGATIVE Final   Influenza B by PCR 01/28/2020 NEGATIVE  NEGATIVE Final   Comment: (NOTE) The Xpert Xpress SARS-CoV-2/FLU/RSV plus assay is intended as an aid in the diagnosis of influenza from Nasopharyngeal swab specimens and should not be used as a sole basis for treatment. Nasal washings and aspirates are unacceptable for Xpert Xpress SARS-CoV-2/FLU/RSV testing.  Fact Sheet for Patients: EntrepreneurPulse.com.au  Fact Sheet for Healthcare Providers: IncredibleEmployment.be  This test is not yet approved or cleared by the [place] FDA and has been authorized for detection and/or diagnosis of SARS-CoV-2 by FDA under an Emergency Use Authorization (EUA). This EUA  will remain in effect (meaning this test can be used) for the duration of the COVID-19 declaration under Section 564(b)(1) of the Act, 21 U.S.C. section 360bbb-3(b)(1), unless the authorization is terminated or revoked.     Resp Syncytial Virus by PCR 01/28/2020 NEGATIVE  NEGATIVE Final   Comment: (NOTE) Fact Sheet for Patients: EntrepreneurPulse.com.au  Fact Sheet for Healthcare Providers: IncredibleEmployment.be  This test is not yet approved or cleared by the [place] FDA and has been authorized for detection and/or diagnosis of SARS-CoV-2 by FDA under an Emergency Use Authorization (EUA). This EUA  will remain in effect (meaning this test can be used) for the duration of the COVID-19 declaration under Section 564(b)(1) of the Act, 21 U.S.C. section 360bbb-3(b)(1), unless the authorization is terminated or revoked.  Performed at [facility], [place] [address] [place], [place] [address]    WBC 01/29/2020 6.0  4.0 - 10.5 K/uL Final   RBC 01/29/2020 4.17  3.87 - 5.11 MIL/uL Final   Hemoglobin 01/29/2020 13.1  12.0 - 15.0 g/dL Final   HCT 01/29/2020 39.3  36.0 - 46.0 % Final   MCV 01/29/2020 94.2  80.0 - 100.0 fL Final   MCH 01/29/2020 31.4  26.0 - 34.0 pg Final   MCHC 01/29/2020 33.3  30.0 - 36.0 g/dL Final   RDW 01/29/2020 15.1  11.5 - 15.5 % Final   Platelets 01/29/2020 257  150 - 400 K/uL Final   nRBC 01/29/2020 0.0  0.0 - 0.2 % Final   Neutrophils Relative % 01/29/2020 45  % Final   Neutro Abs 01/29/2020 2.7  1.7 - 7.7 K/uL Final   Lymphocytes Relative 01/29/2020 41  % Final   Lymphs Abs 01/29/2020 2.4  0.7 - 4.0 K/uL Final   Monocytes Relative 01/29/2020 13  % Final   Monocytes Absolute 01/29/2020 0.8  0.1 - 1.0 K/uL Final   Eosinophils Relative 01/29/2020 1  % Final   Eosinophils Absolute 01/29/2020 0.1  0.0 - 0.5 K/uL Final   Basophils Relative 01/29/2020 0  % Final   Basophils Absolute 01/29/2020 0.0  0.0 - 0.1 K/uL Final   Immature Granulocytes 01/29/2020 0  % Final   Abs Immature Granulocytes 01/29/2020 0.01  0.00 - 0.07 K/uL Final   Performed at [facility], [place] [address] [place],  [address]  Admission on 01/22/2020, Discharged on 01/23/2020  Component Date Value Ref Range Status   Sodium 01/22/2020 134 (A) 135 - 145 mmol/L Final   Potassium 01/22/2020 4.3  3.5 - 5.1 mmol/L Final   Chloride 01/22/2020 101  98 - 111 mmol/L Final   CO2 01/22/2020 23  22 - 32 mmol/L Final   Glucose, Bld 01/22/2020 84  70 - 99 mg/dL Final   Glucose reference range applies only to samples taken after fasting for at least 8 hours.   BUN  01/22/2020 11  6 - 20 mg/dL Final   Creatinine, Ser 01/22/2020 0.75  0.44 - 1.00 mg/dL Final   Calcium 01/22/2020 9.6  8.9 - 10.3 mg/dL Final   Total Protein 01/22/2020 7.3  6.5 - 8.1 g/dL Final   Albumin 01/22/2020 3.8  3.5 - 5.0 g/dL Final   AST 01/22/2020 16  15 - 41 U/L Final   ALT 01/22/2020 10  0 - 44 U/L Final   Alkaline Phosphatase 01/22/2020 68  38 - 126 U/L Final   Total Bilirubin 01/22/2020 0.8  0.3 - 1.2 mg/dL Final   GFR, Estimated 01/22/2020 >60  >60  mL/min Final   Comment: (NOTE) Calculated using the CKD-EPI Creatinine Equation (2021)    Anion gap 01/22/2020 10  5 - 15 Final   Performed at [facility], [place] [address], [place], [place] [address]   Alcohol, Ethyl (B) 01/22/2020 <10  <10 mg/dL Final   Comment: (NOTE) Lowest detectable limit for serum alcohol is 10 mg/dL.  For medical purposes only. Performed at [facility], [place] [address], [place], [place] [address]    Salicylate Lvl 62/22/9798 <7.0 (A) 7.0 - 30.0 mg/dL Final   Performed at [place] [address], [place], [place] [address]   Acetaminophen (Tylenol), Serum 01/22/2020 <10 (A) 10 - 30 ug/mL Final   Comment: (NOTE) Therapeutic concentrations vary significantly. A range of 10-30 ug/mL  may be an effective concentration for many patients. However, some  are best treated at concentrations outside of this range. Acetaminophen concentrations >150 ug/mL at 4 hours after ingestion  and >50 ug/mL at 12 hours after ingestion are often associated with  toxic reactions.  Performed at [facility], [place] [address] [place], [place] [address]    WBC 01/22/2020 8.0  4.0 - 10.5 K/uL Final   RBC 01/22/2020 4.21  3.87 - 5.11 MIL/uL Final   Hemoglobin 01/22/2020 13.4  12.0 - 15.0 g/dL Final   HCT 01/22/2020 39.7  36.0 - 46.0 % Final   MCV 01/22/2020 94.3  80.0 - 100.0 fL Final   MCH 01/22/2020 31.8  26.0 - 34.0 pg Final   MCHC 01/22/2020 33.8  30.0 - 36.0 g/dL Final   RDW 01/22/2020  15.3  11.5 - 15.5 % Final   Platelets 01/22/2020 286  150 - 400 K/uL Final   nRBC 01/22/2020 0.0  0.0 - 0.2 % Final   Performed at [place] [address], [place], [place] [address]   Opiates 01/22/2020 NONE DETECTED  NONE DETECTED Final   Cocaine 01/22/2020 POSITIVE (A) NONE DETECTED Final   Benzodiazepines 01/22/2020 NONE DETECTED  NONE DETECTED Final   Amphetamines 01/22/2020 NONE DETECTED  NONE DETECTED Final   Tetrahydrocannabinol 01/22/2020 NONE DETECTED  NONE DETECTED Final   Barbiturates 01/22/2020 NONE DETECTED  NONE DETECTED Final   Comment: (NOTE) DRUG SCREEN FOR MEDICAL PURPOSES ONLY.  IF CONFIRMATION IS NEEDED FOR ANY PURPOSE, NOTIFY LAB WITHIN 5 DAYS.  LOWEST DETECTABLE LIMITS FOR URINE DRUG SCREEN Drug Class                     Cutoff (ng/mL) Amphetamine and metabolites    1000 Barbiturate and metabolites    200 Benzodiazepine                 185 Tricyclics and metabolites     300 Opiates and metabolites        300 Cocaine and metabolites        300 THC                            50 Performed at [facility], [place] [address], [place], [place] [address]    SARS Coronavirus 2 by RT PCR 01/22/2020 NEGATIVE  NEGATIVE Final   Comment: (NOTE) SARS-CoV-2 target nucleic acids are NOT DETECTED.  The SARS-CoV-2 RNA is generally detectable in upper respiratory specimens during the acute phase of infection. The lowest concentration of SARS-CoV-2 viral copies this assay can detect is 138 copies/mL. A negative result  does not preclude SARS-Cov-2 infection and should not be used as the sole basis for treatment or other patient management decisions. A negative result may occur with  improper specimen collection/handling, submission of specimen other than nasopharyngeal swab, presence of viral mutation(s) within the areas targeted by this assay, and inadequate number of viral copies(<138 copies/mL). A negative result must be combined with clinical  observations, patient history, and epidemiological information. The expected result is Negative.  Fact Sheet for Patients:  EntrepreneurPulse.com.au  Fact Sheet for Healthcare Providers:  IncredibleEmployment.be  This test is no                          t yet approved or cleared by the [place] FDA and  has been authorized for detection and/or diagnosis of SARS-CoV-2 by FDA under an Emergency Use Authorization (EUA). This EUA will remain  in effect (meaning this test can be used) for the duration of the COVID-19 declaration under Section 564(b)(1) of the Act, 21 U.S.C.section 360bbb-3(b)(1), unless the authorization is terminated  or revoked sooner.       Influenza A by PCR 01/22/2020 NEGATIVE  NEGATIVE Final   Influenza B by PCR 01/22/2020 NEGATIVE  NEGATIVE Final   Comment: (NOTE) The Xpert Xpress SARS-CoV-2/FLU/RSV plus assay is intended as an aid in the diagnosis of influenza from Nasopharyngeal swab specimens and should not be used as a sole basis for treatment. Nasal washings and aspirates are unacceptable for Xpert Xpress SARS-CoV-2/FLU/RSV testing.  Fact Sheet for Patients: EntrepreneurPulse.com.au  Fact Sheet for Healthcare Providers: IncredibleEmployment.be  This test is not yet approved or cleared by the [place] FDA and has been authorized for detection and/or diagnosis of SARS-CoV-2 by FDA under an Emergency Use Authorization (EUA). This EUA will remain in effect (meaning this test can be used) for the duration of the COVID-19 declaration under Section 564(b)(1) of the Act, 21 U.S.C. section 360bbb-3(b)(1), unless the authorization is terminated or revoked.     Resp Syncytial Virus by PCR 01/22/2020 NEGATIVE  NEGATIVE Final   Comment: (NOTE) Fact Sheet for Patients: EntrepreneurPulse.com.au  Fact Sheet for Healthcare  Providers: IncredibleEmployment.be  This test is not yet approved or cleared by the [place] FDA and has been authorized for detection and/or diagnosis of SARS-CoV-2 by FDA under an Emergency Use Authorization (EUA). This EUA will remain in effect (meaning this test can be used) for the duration of the COVID-19 declaration under Section 564(b)(1) of the Act, 21 U.S.C. section 360bbb-3(b)(1), unless the authorization is terminated or revoked.  Performed at [facility], [place] [address], [place], [place] [address]     Allergies: Patient has no known allergies.  PTA Medications: (Not in a hospital admission)   Medical Decision Making  Patient admitted to Continuous Assessment Unit for safety, stabilization, and medication management.    .Lab Orders  Resp Panel by RT-PCR (Flu A&B, Covid) Nasopharyngeal Swab  CBC with Differential/Platelet  Comprehensive metabolic panel  Magnesium  Ethanol  Lipid panel  TSH  Hemoglobin A1c  Urinalysis, Routine w reflex microscopic Urine, Clean Catch  POC SARS Coronavirus 2 Ag-ED - Nasal Swab (BD Veritor Kit)  POCT Urine Drug Screen - (ICup)     Medication Management: Restarted home medication (list faxed from [facility])  amLODipine  10 mg Oral Daily   FLUoxetine  40 mg Oral Daily   prazosin  3 mg Oral QHS   QUEtiapine  300 mg Oral QHS  traZODone  150 mg Oral QHS   Recommendations  Based on my evaluation the patient does not appear to have an emergency medical condition.  Anniebelle Devore, NP 07/09/20  4:07 PM

## 2020-07-09 NOTE — Progress Notes (Signed)
   07/09/20 1525  [facility] Triage Screening (Walk-ins at [facility] only)  How Did You Hear About [place]?  (ACTT staff)  What Is the Reason for Your Visit/Call Today? Patient presents with [facility] ACTT staff, Mr. Manson Passey.  He reports he has first contact with patient today, and during their meeting patient became tearful and reported SI.  Current stressors are homelessness and dealing with a defiant 52 y.o. son.  She is unable to contract for safety, however she does not identify a plan per Mr. Manson Passey.  Patient endorses SI, however denies HI and AVH.  She denies recent SA, however has a SA hx.  How Long Has This Been Causing You Problems? 1 wk - 1 month  Have You Recently Had Any Thoughts About Hurting Yourself? Yes  How long ago did you have thoughts about hurting yourself? today  Are You Planning to Commit Suicide/Harm Yourself At This time? Yes (No plan identified)  Have you Recently Had Thoughts About Hurting Someone [name]? No  Are You Planning To Harm Someone At This Time? No  Are you currently experiencing any auditory, visual or other hallucinations? No  Have You Used Any Alcohol or Drugs in the Past 24 Hours? No  What Did You Use and How Much? 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 No  Clinician description of patient physical appearance/behavior: Tearful, flat affect.  Cooperative  What Do You Feel Would Help You the Most Today? Treatment for Depression or other mood problem  If access to [facility] Urgent Care was not available, would you have sought care in the Emergency Department? Yes  Determination of Need Urgent (48 hours)  Options For Referral Inpatient Hospitalization;BH Urgent Care
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
## 2020-07-09 NOTE — Discharge Instructions (Addendum)

## 2020-07-09 NOTE — ED Notes (Signed)
Pt admitted to continuous assessment due to endorsing SI with plan to shoot self and unable to contract for safety if discharged. Pt trigger is homeless x2 years, non-compliant with medication and substance use. Cooperative throughout assessment. Denies HI/AVH. Oriented to unit and unit rules. Will monitor for safety.

## 2020-07-09 NOTE — ED Notes (Signed)
Pt sleeping at present, no distress noted.  Monitoring for safety. 

## 2020-07-10 ENCOUNTER — Ambulatory Visit (HOSPITAL_COMMUNITY)
Admission: RE | Admit: 2020-07-10 | Discharge: 2020-07-10 | Disposition: A | Discharge status: Home / Self Care | Payer: Self-pay | Attending: Psychiatry | Admitting: Psychiatry

## 2020-07-10 DIAGNOSIS — F1994 Other psychoactive substance use, unspecified with psychoactive substance-induced mood disorder: Secondary | ICD-10-CM | POA: Insufficient documentation

## 2020-07-10 MED ORDER — PRAZOSIN HCL 1 MG PO CAPS
3.0000 mg | ORAL_CAPSULE | Freq: Every day | ORAL | Status: DC
  Filled 2020-07-10: qty 9

## 2020-07-10 MED ORDER — QUETIAPINE FUMARATE 400 MG PO TABS
400.0000 mg | ORAL_TABLET | Freq: Every day | ORAL | Status: DC
  Filled 2020-07-10: qty 7

## 2020-07-10 MED ORDER — GABAPENTIN 300 MG PO CAPS
600.0000 mg | ORAL_CAPSULE | Freq: Three times a day (TID) | ORAL | Status: DC
  Filled 2020-07-10: qty 42

## 2020-07-10 NOTE — H&P (Signed)
Behavioral Health Medical Screening Exam    Total Time spent with patient: 30 minutes  Rachel Nolan is a 50 y.o female, seen by this provider with TTS counselor face-to-face, presents to [facility] as a voluntary walk-in accompanied by Jonny Ruiz, from [facility] support person.  John was present throughout interview.  Patient reports that she is here "keep me safe "I just need to restart my meds" .  Patient was seen at the behavioral health urgent care and discharged on June 22 where her medications were refilled.  Patient reports that she is taking her medications now, she saw her psychiatrist Dr. Guss Bunde this morning.  She is alert and oriented to person place time and situation, has minimal eye contact throughout interview, sitting quietly and cooperative on exam table.  Speech is normal, volume normal, reports that "she has some hope".  Describes her mood as depressed, not enjoying life and crying frequently.  Patient reports that she does hear her daddy's voice knowing that he is dead, he tells her to not tell anyone about the past.  She denies visual hallucinations.  Patient reports that she is suicidal, when asked her plan she reports anything I can do especially to my sister  She reports that she does not have access to a weapon,.  Patient is homeless.  Patient reports that she last used crack cocaine on June 19 or 21, does not use alcohol very often last used 1 month ago.  Reports that her appetite is so-so and that she is sleeping on the streets.  She has been denied disability they are working on getting her housing through the act team.  He is present today with her ACT team representative, John.  Psychiatric Specialty Exam:  Presentation  General Appearance: Casual  Eye Contact:Minimal  Speech:Clear and Coherent; Normal Rate  Speech Volume:Normal  Handedness:Right   Mood and Affect  Mood:Euthymic  Affect:Appropriate; Congruent   Thought Process  Thought Processes:Coherent;  Goal Directed  Descriptions of Associations:Intact  Orientation:Full (Time, Place and Person)  Thought Content:WDL  History of Schizophrenia/Schizoaffective disorder:No  Duration of Psychotic Symptoms:Greater than six months  Hallucinations:Hallucinations: Auditory Description of Auditory Hallucinations: hears Dad's voices Description of Visual Hallucinations: none  Ideas of Reference:None  Suicidal Thoughts:Suicidal Thoughts: Yes, Passive SI Active Intent and/or Plan: With Intent; With Plan; With Means to Carry Out (I can get my nephews gun) SI Passive Intent and/or Plan: Without Intent; Without Plan; Without Access to Means  Homicidal Thoughts:Homicidal Thoughts: No   Sensorium  Memory:Immediate Good; Recent Good; Remote Good  Judgment:Fair  Insight:Fair   Executive Functions  Concentration:Good  Attention Span:Good  Recall:Good  Fund of Knowledge:Good  Language:Good   Psychomotor Activity  Psychomotor Activity:Psychomotor Activity: Normal   Assets  Assets:Communication Skills; Desire for Improvement; Leisure Time; Social Support   Sleep  Sleep:Sleep: Fair    Physical Exam: Physical Exam Cardiovascular:     Rate and Rhythm: Normal rate and regular rhythm.     Heart sounds: Normal heart sounds.  Pulmonary:     Effort: Pulmonary effort is normal.     Breath sounds: Normal breath sounds.  Skin:    General: Skin is warm and dry.  Neurological:     Mental Status: She is alert and oriented to person, place, and time.  Psychiatric:        Attention and Perception: She perceives auditory (hears deceased Dad's voice) hallucinations.        Mood and Affect: Mood is depressed. Affect is flat.  Affect is not angry, tearful or inappropriate.        Speech: Speech normal.        Behavior: Behavior is cooperative.        Thought Content: Thought content is not paranoid or delusional. Thought content includes suicidal (reports thoughts of self harm)  ideation. Thought content does not include homicidal ideation. Thought content does not include homicidal or suicidal plan.   Review of Systems  Constitutional:  Negative for chills and fever.  Respiratory:  Negative for shortness of breath.   Cardiovascular:  Negative for chest pain.  Gastrointestinal:  Negative for abdominal pain, nausea and vomiting.  Neurological:  Negative for headaches.  Blood pressure 113/88, pulse 80, temperature 98.4 F (36.9 C), temperature source Oral, resp. rate 18, last menstrual period 06/19/2018, SpO2 96 %. There is no height or weight on file to calculate BMI.  Musculoskeletal: Strength & Muscle Tone: within normal limits Gait & Station: normal Patient leans: N/A   Recommendations:  Based on my evaluation the patient does not appear to have an emergency medical condition.  Consulted with Dr. Lucianne Muss, recommends [facility], homeless shelter, [facility], and return to [facility] if condition warrants. She is psychiatrically cleared and safe for outpatient services. Has ACT Team in place and saw Dr. Guss Bunde and restarted on medications this morning.   Novella Olive, NP 07/10/2020, 3:51 PM

## 2020-07-10 NOTE — Progress Notes (Signed)
Rachel Nolan's ACT arrived to tranport her home. She received her AVS, questions answered and her personal items retrieved. She was given a 7 day suppy of medications.

## 2020-07-10 NOTE — ED Notes (Signed)
Pt sleeping at present, no distress noted, monitoring for safety. 

## 2020-07-10 NOTE — Progress Notes (Addendum)
[facility] LCSW Note  07/10/2020   9:38 AM  Type of Contact and Topic:  Discharge  CSW contacted [facility] and spoke with Jonny Ruiz to inform him pt is being discharged and will need to be picked up. He stated he will pick her up "around 11:00 am." NP and nursing staff made aware.  CSW contacted [facility] to inquire it pt will need sample meds, per provider request. Due to being in a meeting at the time, they were unable to state whether or not she will. NP notified.  Wyvonnia Lora, LCSWA 07/10/2020  9:38 AM

## 2020-07-10 NOTE — Progress Notes (Signed)
Pt is asleep. R/R are even and unlabored. No signs of acute distress noted. Staff will monitor for safety.
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
## 2020-07-11 ENCOUNTER — Other Ambulatory Visit: Payer: Self-pay

## 2020-07-11 ENCOUNTER — Encounter (HOSPITAL_COMMUNITY): Payer: Self-pay

## 2020-07-11 ENCOUNTER — Emergency Department (HOSPITAL_COMMUNITY)
Admission: EM | Admit: 2020-07-11 | Discharge: 2020-07-13 | Disposition: A | Discharge status: Home / Self Care | Payer: Self-pay | Attending: Emergency Medicine | Admitting: Emergency Medicine

## 2020-07-11 DIAGNOSIS — R45851 Suicidal ideations: Secondary | ICD-10-CM | POA: Insufficient documentation

## 2020-07-11 DIAGNOSIS — F4310 Post-traumatic stress disorder, unspecified: Secondary | ICD-10-CM | POA: Insufficient documentation

## 2020-07-11 DIAGNOSIS — J45909 Unspecified asthma, uncomplicated: Secondary | ICD-10-CM | POA: Insufficient documentation

## 2020-07-11 DIAGNOSIS — I10 Essential (primary) hypertension: Secondary | ICD-10-CM | POA: Insufficient documentation

## 2020-07-11 DIAGNOSIS — Z20822 Contact with and (suspected) exposure to covid-19: Secondary | ICD-10-CM | POA: Insufficient documentation

## 2020-07-11 DIAGNOSIS — F17210 Nicotine dependence, cigarettes, uncomplicated: Secondary | ICD-10-CM | POA: Insufficient documentation

## 2020-07-11 DIAGNOSIS — F332 Major depressive disorder, recurrent severe without psychotic features: Secondary | ICD-10-CM | POA: Insufficient documentation

## 2020-07-11 DIAGNOSIS — F32A Depression, unspecified: Secondary | ICD-10-CM

## 2020-07-11 DIAGNOSIS — Z79899 Other long term (current) drug therapy: Secondary | ICD-10-CM | POA: Insufficient documentation

## 2020-07-11 LAB — CBC
HCT: 37.4 % (ref 36.0–46.0)
Hemoglobin: 12.1 g/dL (ref 12.0–15.0)
MCH: 31.3 pg (ref 26.0–34.0)
MCHC: 32.4 g/dL (ref 30.0–36.0)
MCV: 96.9 fL (ref 80.0–100.0)
Platelets: 254 10*3/uL (ref 150–400)
RBC: 3.86 MIL/uL — ABNORMAL LOW (ref 3.87–5.11)
RDW: 16.5 % — ABNORMAL HIGH (ref 11.5–15.5)
WBC: 4.3 10*3/uL (ref 4.0–10.5)
nRBC: 0 % (ref 0.0–0.2)

## 2020-07-11 LAB — COMPREHENSIVE METABOLIC PANEL
ALT: 11 U/L (ref 0–44)
AST: 14 U/L — ABNORMAL LOW (ref 15–41)
Albumin: 3.8 g/dL (ref 3.5–5.0)
Alkaline Phosphatase: 63 U/L (ref 38–126)
Anion gap: 5 (ref 5–15)
BUN: 15 mg/dL (ref 6–20)
CO2: 28 mmol/L (ref 22–32)
Calcium: 9.1 mg/dL (ref 8.9–10.3)
Chloride: 108 mmol/L (ref 98–111)
Creatinine, Ser: 0.79 mg/dL (ref 0.44–1.00)
GFR, Estimated: >60 mL/min (ref >60)
Glucose, Bld: 96 mg/dL (ref 70–99)
Potassium: 3.6 mmol/L (ref 3.5–5.1)
Sodium: 141 mmol/L (ref 135–145)
Total Bilirubin: 0.3 mg/dL (ref 0.3–1.2)
Total Protein: 7 g/dL (ref 6.5–8.1)

## 2020-07-11 LAB — I-STAT BETA HCG BLOOD, ED (MC, WL, AP ONLY): I-stat hCG, quantitative: <5 m[IU]/mL (ref <5)

## 2020-07-11 LAB — SALICYLATE LEVEL: Salicylate Lvl: <7 mg/dL — ABNORMAL LOW (ref 7.0–30.0)

## 2020-07-11 LAB — ETHANOL: Alcohol, Ethyl (B): <10 mg/dL (ref <10)

## 2020-07-11 LAB — ACETAMINOPHEN LEVEL: Acetaminophen (Tylenol), Serum: <10 ug/mL — ABNORMAL LOW (ref 10–30)

## 2020-07-11 MED ORDER — TRAZODONE HCL 100 MG PO TABS
150.0000 mg | ORAL_TABLET | Freq: Every day | ORAL | Status: DC
  Administered 2020-07-11 – 2020-07-12 (×2): 150 mg via ORAL
  Filled 2020-07-11 (×2): qty 2

## 2020-07-11 MED ORDER — AMLODIPINE BESYLATE 5 MG PO TABS
10.0000 mg | ORAL_TABLET | Freq: Once | ORAL | Status: AC
  Administered 2020-07-11: 10 mg via ORAL
  Filled 2020-07-11: qty 2

## 2020-07-11 MED ORDER — QUETIAPINE FUMARATE 300 MG PO TABS
300.0000 mg | ORAL_TABLET | Freq: Once | ORAL | Status: AC
  Administered 2020-07-11: 300 mg via ORAL
  Filled 2020-07-11: qty 1

## 2020-07-11 MED ORDER — FLUOXETINE HCL 20 MG PO CAPS
40.0000 mg | ORAL_CAPSULE | Freq: Once | ORAL | Status: AC
  Administered 2020-07-11: 40 mg via ORAL
  Filled 2020-07-11: qty 2

## 2020-07-11 NOTE — ED Provider Notes (Signed)
Emergency Medicine Provider Triage Evaluation Note  Rachel Nolan , a 50 y.o. female  was evaluated in triage.  Pt complains of auditory hallucinations recurring yesterday. Planning on taking all her medications at once in attempt to self harm.   Review of Systems  Positive: Auditory hallucinations, SI Negative: Physical complaints, overdose  Physical Exam  BP (!) 135/115 (BP Location: Right Arm)   Pulse 66   Temp 98.6 F (37 C) (Oral)   Resp 16   Ht 5\' 6"  (1.676 m)   Wt 95.3 kg   LMP 06/19/2018 (Exact Date)   SpO2 100%   BMI 33.89 kg/m  Gen:   Awake, no distress   Resp:  Normal effort  MSK:   Moves extremities without difficulty  Other:    Medical Decision Making  Medically screening exam initiated at 5:37 PM.  Appropriate orders placed.  Rachel Nolan was informed that the remainder of the evaluation will be completed by another provider, this initial triage assessment does not replace that evaluation, and the importance of remaining in the ED until their evaluation is complete.     [name], PA-C 07/11/20 1738    07/13/20, MD 07/16/20 [phone]
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
## 2020-07-11 NOTE — ED Notes (Signed)
Post-ambulation to bathroom, pt states she feels short of breath. Oxygen saturation 100% at this time

## 2020-07-11 NOTE — ED Notes (Signed)
Patient is resting comfortably. 

## 2020-07-11 NOTE — ED Notes (Signed)
Pt given cheese and crackers. Pt remains calm and cooperative

## 2020-07-12 MED ORDER — PRAZOSIN HCL 1 MG PO CAPS
3.0000 mg | ORAL_CAPSULE | Freq: Every day | ORAL | Status: DC
  Administered 2020-07-12: 3 mg via ORAL
  Filled 2020-07-12: qty 3

## 2020-07-12 MED ORDER — QUETIAPINE FUMARATE 300 MG PO TABS
400.0000 mg | ORAL_TABLET | Freq: Every day | ORAL | Status: DC
  Administered 2020-07-12: 400 mg via ORAL
  Filled 2020-07-12: qty 1

## 2020-07-12 MED ORDER — TRAZODONE HCL 100 MG PO TABS: 150.0000 mg | ORAL_TABLET | Freq: Every evening | ORAL | Status: DC | PRN

## 2020-07-12 MED ORDER — FLUOXETINE HCL 20 MG PO CAPS: 40.0000 mg | ORAL_CAPSULE | Freq: Every day | ORAL | Status: DC

## 2020-07-12 MED ORDER — AMLODIPINE BESYLATE 5 MG PO TABS: 10.0000 mg | ORAL_TABLET | Freq: Every day | ORAL | Status: DC

## 2020-07-12 MED ORDER — GABAPENTIN 300 MG PO CAPS
600.0000 mg | ORAL_CAPSULE | Freq: Three times a day (TID) | ORAL | Status: DC
  Administered 2020-07-12: 600 mg via ORAL
  Filled 2020-07-12: qty 2

## 2020-07-12 NOTE — ED Notes (Signed)
Pt sleeping in stretcher, breathing even and unlabored. NAD. Pending psychiatric placement.

## 2020-07-12 NOTE — BH Assessment (Signed)
Patient was seen this date by this writer to assess current mental health status. Patient continues to voice ongoing S/I with a plan to overdose. Patient denies any H/I or AVH. Patient is vague in reference to her current mental health symptoms and is observed to be drowsy. Per Leevy-Johnson NP patient contnues to meet inpatient criteria.

## 2020-07-12 NOTE — Progress Notes (Signed)
Per Leroy Sea, patient meets criteria for inpatient treatment. There are no available or appropriate beds at [facility] today. CSW faxed referrals to the following facilities for review:  [facility]  TTS will continue to seek bed placement.  Crissie Reese, MSW, LCSW-A, LCAS-A Phone: [phone] Disposition/TOC

## 2020-07-12 NOTE — Consult Note (Addendum)
Rachel [facility] is a 50 year old female with a past history of anxiety, depression, polysubstance abuse, and substance induced mood disorder to name a few. UDS+ cocaine, THC; BAL<10. She presented to [facility] complaining of suicidal ideations with plan to overdose on her medications. Per TTS patient remains disorganized and continues to endorse SI. Currently being followed by [facility] ACTT Team [phone]; [phone] (crisis).   Plan:   -continue to seek inpatient psychiatric placement  -Restart home medications:    -Amlodipine TAB 10 mg daily (start 06/26)   -Fluoxetine CAP 40 mg daily (start 06/26)   -Gabapentin CAP 600 mg TID   -Quetiapine TAB 400 mg HS   -Trazodone TAB 150 mg PRN HS

## 2020-07-13 ENCOUNTER — Encounter (HOSPITAL_COMMUNITY): Payer: Self-pay | Admitting: Nurse Practitioner

## 2020-07-13 ENCOUNTER — Inpatient Hospital Stay (HOSPITAL_COMMUNITY)
Admission: AD | Admit: 2020-07-13 | Discharge: 2020-07-18 | DRG: 885 | Disposition: A | Discharge status: Home / Self Care | Payer: No Typology Code available for payment source | Source: Intra-hospital | Attending: Emergency Medicine | Admitting: Emergency Medicine

## 2020-07-13 ENCOUNTER — Other Ambulatory Visit: Payer: Self-pay

## 2020-07-13 DIAGNOSIS — T43596A Underdosing of other antipsychotics and neuroleptics, initial encounter: Secondary | ICD-10-CM | POA: Diagnosis present

## 2020-07-13 DIAGNOSIS — F1410 Cocaine abuse, uncomplicated: Secondary | ICD-10-CM | POA: Diagnosis present

## 2020-07-13 DIAGNOSIS — F333 Major depressive disorder, recurrent, severe with psychotic symptoms: Secondary | ICD-10-CM | POA: Diagnosis present

## 2020-07-13 DIAGNOSIS — Z91138 Patient's unintentional underdosing of medication regimen for other reason: Secondary | ICD-10-CM | POA: Diagnosis not present

## 2020-07-13 DIAGNOSIS — G479 Sleep disorder, unspecified: Secondary | ICD-10-CM | POA: Diagnosis present

## 2020-07-13 DIAGNOSIS — F17210 Nicotine dependence, cigarettes, uncomplicated: Secondary | ICD-10-CM | POA: Diagnosis present

## 2020-07-13 DIAGNOSIS — R45851 Suicidal ideations: Secondary | ICD-10-CM | POA: Diagnosis present

## 2020-07-13 DIAGNOSIS — F332 Major depressive disorder, recurrent severe without psychotic features: Secondary | ICD-10-CM | POA: Diagnosis present

## 2020-07-13 DIAGNOSIS — F515 Nightmare disorder: Secondary | ICD-10-CM | POA: Diagnosis present

## 2020-07-13 DIAGNOSIS — Z8249 Family history of ischemic heart disease and other diseases of the circulatory system: Secondary | ICD-10-CM | POA: Diagnosis not present

## 2020-07-13 DIAGNOSIS — F323 Major depressive disorder, single episode, severe with psychotic features: Secondary | ICD-10-CM | POA: Diagnosis present

## 2020-07-13 DIAGNOSIS — Z9151 Personal history of suicidal behavior: Secondary | ICD-10-CM | POA: Diagnosis not present

## 2020-07-13 DIAGNOSIS — F331 Major depressive disorder, recurrent, moderate: Secondary | ICD-10-CM | POA: Diagnosis present

## 2020-07-13 DIAGNOSIS — Z79899 Other long term (current) drug therapy: Secondary | ICD-10-CM

## 2020-07-13 DIAGNOSIS — F1494 Cocaine use, unspecified with cocaine-induced mood disorder: Secondary | ICD-10-CM | POA: Diagnosis present

## 2020-07-13 DIAGNOSIS — Z638 Other specified problems related to primary support group: Secondary | ICD-10-CM | POA: Diagnosis not present

## 2020-07-13 DIAGNOSIS — I10 Essential (primary) hypertension: Secondary | ICD-10-CM | POA: Diagnosis present

## 2020-07-13 DIAGNOSIS — F4310 Post-traumatic stress disorder, unspecified: Secondary | ICD-10-CM | POA: Diagnosis present

## 2020-07-13 DIAGNOSIS — G4700 Insomnia, unspecified: Secondary | ICD-10-CM | POA: Diagnosis present

## 2020-07-13 DIAGNOSIS — F1423 Cocaine dependence with withdrawal: Secondary | ICD-10-CM | POA: Diagnosis present

## 2020-07-13 DIAGNOSIS — G8929 Other chronic pain: Secondary | ICD-10-CM | POA: Diagnosis present

## 2020-07-13 DIAGNOSIS — Z5901 Sheltered homelessness: Secondary | ICD-10-CM | POA: Diagnosis not present

## 2020-07-13 LAB — RESP PANEL BY RT-PCR (FLU A&B, COVID) ARPGX2
Influenza A by PCR: NEGATIVE
Influenza B by PCR: NEGATIVE
SARS Coronavirus 2 by RT PCR: NEGATIVE

## 2020-07-13 MED ORDER — AMLODIPINE BESYLATE 10 MG PO TABS
10.0000 mg | ORAL_TABLET | Freq: Every day | ORAL | Status: DC
  Administered 2020-07-13 – 2020-07-18 (×6): 10 mg via ORAL
  Filled 2020-07-13 (×8): qty 1

## 2020-07-13 MED ORDER — ACETAMINOPHEN 325 MG PO TABS
650.0000 mg | ORAL_TABLET | Freq: Four times a day (QID) | ORAL | Status: DC | PRN
  Administered 2020-07-13: 650 mg via ORAL
  Filled 2020-07-13 (×3): qty 2

## 2020-07-13 MED ORDER — FLUOXETINE HCL 20 MG PO CAPS
40.0000 mg | ORAL_CAPSULE | Freq: Every day | ORAL | Status: DC
  Administered 2020-07-13 – 2020-07-18 (×6): 40 mg via ORAL
  Filled 2020-07-13 (×8): qty 2

## 2020-07-13 MED ORDER — IBUPROFEN 400 MG PO TABS
400.0000 mg | ORAL_TABLET | Freq: Four times a day (QID) | ORAL | Status: DC | PRN
  Administered 2020-07-13 – 2020-07-15 (×3): 400 mg via ORAL
  Filled 2020-07-13 (×3): qty 1

## 2020-07-13 MED ORDER — MAGNESIUM HYDROXIDE 400 MG/5ML PO SUSP
30.0000 mL | Freq: Every day | ORAL | Status: DC | PRN
Start: 2020-07-13 — End: 2020-07-18

## 2020-07-13 MED ORDER — HYDROXYZINE HCL 25 MG PO TABS
25.0000 mg | ORAL_TABLET | Freq: Three times a day (TID) | ORAL | Status: DC | PRN
  Administered 2020-07-14 – 2020-07-15 (×2): 25 mg via ORAL
  Filled 2020-07-13 (×3): qty 1

## 2020-07-13 MED ORDER — TRAZODONE HCL 150 MG PO TABS
150.0000 mg | ORAL_TABLET | Freq: Every day | ORAL | Status: DC
  Administered 2020-07-13 – 2020-07-15 (×3): 150 mg via ORAL
  Filled 2020-07-13 (×4): qty 1

## 2020-07-13 MED ORDER — QUETIAPINE FUMARATE 400 MG PO TABS
400.0000 mg | ORAL_TABLET | Freq: Every day | ORAL | Status: DC
  Administered 2020-07-13 – 2020-07-15 (×3): 400 mg via ORAL
  Filled 2020-07-13 (×4): qty 1

## 2020-07-13 MED ORDER — GABAPENTIN 600 MG PO TABS
600.0000 mg | ORAL_TABLET | Freq: Three times a day (TID) | ORAL | Status: DC
  Administered 2020-07-13 – 2020-07-16 (×8): 600 mg via ORAL
  Filled 2020-07-13 (×11): qty 1

## 2020-07-13 MED ORDER — ALUM & MAG HYDROXIDE-SIMETH 200-200-20 MG/5ML PO SUSP: 30.0000 mL | ORAL | Status: DC | PRN

## 2020-07-13 MED ORDER — PRAZOSIN HCL 1 MG PO CAPS
3.0000 mg | ORAL_CAPSULE | Freq: Every day | ORAL | Status: DC
  Administered 2020-07-13 – 2020-07-17 (×5): 3 mg via ORAL
  Filled 2020-07-13 (×6): qty 3

## 2020-07-13 NOTE — [facility] Group Notes (Signed)
[facility] Group Notes:  (Nursing/MHT/Case Management/Adjunct)  Date:  07/13/2020  Time:  5:23 PM  Type of Therapy:  Relaxation therapy  Participation Level:  Did Not Attend  Participation Quality:    Affect:    Cognitive:    Insight:    Engagement in Group:    Modes of Intervention:  Activity  Summary of Progress/Problems:  Did not attend despite staff invitation.    Rachel [facility] V Dominika Losey 07/13/2020, 5:23 PM 
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
## 2020-07-13 NOTE — Tx Team (Signed)
Initial Treatment Plan 07/13/2020 9:29 AM [name] OTL:[id number]    PATIENT STRESSORS: Financial difficulties Health problems Loss of Nephew   PATIENT STRENGTHS: Communication skills General fund of knowledge Supportive family/friends   PATIENT IDENTIFIED PROBLEMS: Depression  Suicidal Ideation  Psychosis      "I want help to get rid of the voices"  "Stable housing if possible"         DISCHARGE CRITERIA:  Improved stabilization in mood, thinking, and/or behavior Need for constant or close observation no longer present Reduction of life-threatening or endangering symptoms to within safe limits Verbal commitment to aftercare and medication compliance  PRELIMINARY DISCHARGE PLAN: Outpatient therapy [facility]  PATIENT/FAMILY INVOLVEMENT: This treatment plan has been presented to and reviewed with the patient, Rachel Nolan.  The patient and family have been given the opportunity to ask questions and make suggestions.  Levin Bacon, RN 07/13/2020, 9:29 AM

## 2020-07-13 NOTE — BH Assessment (Signed)
Per Rutha Bouchard, RN pt has been accepted to [facility] pending negative COVID and assigned to room/bed: 403-2. Pt can come after 0800. Attending physician. Dr. Mason Jim. Updated disposition discussed with Dr. Jaci Carrel and Rae Roam, RN via secure chat in Epic.     Redmond Pulling, MS, [facility], [facility] Triage Specialist [phone]

## 2020-07-13 NOTE — ED Notes (Signed)
Report called and given to [facility].

## 2020-07-13 NOTE — H&P (Signed)
Psychiatric Admission Assessment Adult  Patient Identification: Rachel Nolan MRN:  [id number] Date of Evaluation:  07/13/2020 Chief Complaint:  MDD (major depressive disorder), recurrent severe, without psychosis ([place]) [F33.2] Principal Diagnosis: MDD (major depressive disorder), recurrent severe, without psychosis ([place]) Diagnosis:  Principal Problem:   MDD (major depressive disorder), recurrent severe, without psychosis ([place]) Active Problems:   Cocaine-induced mood disorder ([place])   Polysubstance abuse ([place])  History of Present Illness: Rachel Nolan is a 50 year old female well known to [facility]. This represents one of several inpatient admissions for this patient. Patient was seen and evaluated on the unit today. She was in her room eating her lunch while lying in her bed. She is homeless. She stated she is staying at  house with a girl she just met and has been there for 3 days. She stated her goal for this hospitalization is to get back on her meds and have her ACT team find her housing. Her UDS was positive for cocaine and THC. She has a known "crack" cocaine habit, however stated today that she did not want to go to rehab because "crack" is not a problem for her. She stated she last did crack a week ago. I remnded her she is positive for cocaine and it does not stay in your system for a week. Patient was seen at [facility] on 6/22 for the same presentation. She was given her medication there and discharged on the 6/23 with prescriptions for 7 days of medications. She was picked up by her ACT team. Her ACT team then took her to the emergency room at 14:39 on 6/23. She was evaluated and recommended for discharge by Pecolia Ades, NP. On 6/24 at 17:09 returned to [facility] with the same presentation, suicidal with a plan to shoot herself or overdose. She was evaluated once more and recommended for inpatient hospitalization. She was admitted to the 400 hall for crisis stabilization and medication management. She  told this provider today that she has not had her meds in 2 weeks, she told Dr Nelda Marseille she has not had her meds in 3 weeks. Today she is denying SI/HI/AVH, paranoia and delusions. She arrived to the unit, asked for food and ate it and promptly went to sleep. She will be restarted on her home medications while she is in the hospital. She was asking today if she signed herself in can she sign herself out. She stated her son is having surgery on Friday in [place], [place] and she wants to go and be there for him.  Patient has been calm and cooperative and will be monitored for safety. She is able to contract for safety while on the unit.   Labs reviewed: CMP with albumin 14. Lipid profile within normal limits. CBC with diff RBC's 3.86, RDW 16.5. Acetaminophen level <10. Salicylate <7. hCG <5. Influenza and COVID negative. UDS positive cocaine and THC, Alcohol <10. EKG QTc 471, NSR BPM 73. UA collected on 6/22 is likely a dirty catch with Rare bacteria, Ketones 5, trace leukocytes, hazy in color.   Associated Signs/Symptoms: Depression Symptoms:  depressed mood, feelings of worthlessness/guilt, hopelessness, suicidal thoughts without plan, anxiety, loss of energy/fatigue, Duration of Depression Symptoms: Greater than two weeks  (Hypo) Manic Symptoms:  Irritable Mood, Anxiety Symptoms:  Excessive Worry, Psychotic Symptoms:   None, denies hallucinations PTSD Symptoms: Negative Re-experiencing:  Intrusive Thoughts gunshots Total Time spent with patient: 45 minutes  Past Psychiatric History: Six previous hospitalizations. History of MDD, PTSD, cocaine use disorder, and  benzodiazepine use disorder. Denies ever participating in outpatient treatment or therapy for mental health or substance abuse. Two prior suicide attempts via overdose. Prior history of hospitilization for benzodiazepine withdrawals  Is the patient at risk to self? Yes.    Has the patient been a risk to self in the past 6 months? Yes.     Has the patient been a risk to self within the distant past? Yes.    Is the patient a risk to others? No.  Has the patient been a risk to others in the past 6 months? No.  Has the patient been a risk to others within the distant past? No.   Prior Inpatient Therapy:  Yes Prior Outpatient Therapy:  Patient denies  Alcohol Screening: 1. How often do you have a drink containing alcohol?: Monthly or less 2. How many drinks containing alcohol do you have on a typical day when you are drinking?: 1 or 2 3. How often do you have six or more drinks on one occasion?: Never AUDIT-C Score: 1 9. Have you or someone else been injured as a result of your drinking?: No 10. Has a relative or friend or a doctor or another health worker been concerned about your drinking or suggested you cut down?: No Alcohol Use Disorder Identification Test Final Score (AUDIT): 1 Alcohol Brief Interventions/Follow-up: Patient Refused Substance Abuse History in the last 12 months:  Yes.   Consequences of Substance Abuse: Medical Consequences:  contributory to repeated inpatient admissions Family Consequences:  Homelessness, family estrangement Previous Psychotropic Medications: Yes  Psychological Evaluations: Yes  Past Medical History:  Past Medical History:  Diagnosis Date   Anxiety    Asthma    Depression    Hypertension    Scoliosis     Past Surgical History:  Procedure Laterality Date   BACK SURGERY     ECTOPIC PREGNANCY SURGERY     Family History:  Family History  Problem Relation Age of Onset   Hypertension Mother    Hypertension Father    Family Psychiatric  History: Father and sister with substance use disorders Tobacco Screening: Have you used any form of tobacco in the last 30 days? (Cigarettes, Smokeless Tobacco, Cigars, and/or Pipes): Yes Tobacco use, Select all that apply: 5 or more cigarettes per day Are you interested in Tobacco Cessation Medications?: No, patient refused Counseled  patient on smoking cessation including recognizing danger situations, developing coping skills and basic information about quitting provided: Refused/Declined practical counseling Social History:  Social History   Substance and Sexual Activity  Alcohol Use Yes   Comment: one 40 oz per month     Social History   Substance and Sexual Activity  Drug Use Yes   Types: Marijuana, Cocaine, "Crack" cocaine    Additional Social History:   Allergies:  No Known Allergies Lab Results:  Results for orders placed or performed during the hospital encounter of 07/11/20 (from the past 48 hour(s))  Comprehensive metabolic panel     Status: Abnormal   Collection Time: 07/11/20  5:47 PM  Result Value Ref Range   Sodium 141 135 - 145 mmol/L   Potassium 3.6 3.5 - 5.1 mmol/L   Chloride 108 98 - 111 mmol/L   CO2 28 22 - 32 mmol/L   Glucose, Bld 96 70 - 99 mg/dL    Comment: Glucose reference range applies only to samples taken after fasting for at least 8 hours.   BUN 15 6 - 20 mg/dL   Creatinine, Ser 0.79  0.44 - 1.00 mg/dL   Calcium 9.1 8.9 - 10.3 mg/dL   Total Protein 7.0 6.5 - 8.1 g/dL   Albumin 3.8 3.5 - 5.0 g/dL   AST 14 (L) 15 - 41 U/L   ALT 11 0 - 44 U/L   Alkaline Phosphatase 63 38 - 126 U/L   Total Bilirubin 0.3 0.3 - 1.2 mg/dL   GFR, Estimated >60 >60 mL/min    Comment: (NOTE) Calculated using the CKD-EPI Creatinine Equation (2021)    Anion gap 5 5 - 15    Comment: Performed at [facility], [place] [address], [place], [place] [address]  Ethanol     Status: None   Collection Time: 07/11/20  5:47 PM  Result Value Ref Range   Alcohol, Ethyl (B) <10 <10 mg/dL    Comment: (NOTE) Lowest detectable limit for serum alcohol is 10 mg/dL.  For medical purposes only. Performed at [facility], [place] [address], [place], [place] [address]   Salicylate level     Status: Abnormal   Collection Time: 07/11/20  5:47 PM  Result Value Ref Range   Salicylate  Lvl <4.1 (L) 7.0 - 30.0 mg/dL    Comment: Performed at [facility], [place] [address] [place], [place] [address]  Acetaminophen level     Status: Abnormal   Collection Time: 07/11/20  5:47 PM  Result Value Ref Range   Acetaminophen (Tylenol), Serum <10 (L) 10 - 30 ug/mL    Comment: (NOTE) Therapeutic concentrations vary significantly. A range of 10-30 ug/mL  may be an effective concentration for many patients. However, some  are best treated at concentrations outside of this range. Acetaminophen concentrations >150 ug/mL at 4 hours after ingestion  and >50 ug/mL at 12 hours after ingestion are often associated with  toxic reactions.  Performed at [facility], [place] [address], [place], [place] [address]   cbc     Status: Abnormal   Collection Time: 07/11/20  5:47 PM  Result Value Ref Range   WBC 4.3 4.0 - 10.5 K/uL   RBC 3.86 (L) 3.87 - 5.11 MIL/uL   Hemoglobin 12.1 12.0 - 15.0 g/dL   HCT 37.4 36.0 - 46.0 %   MCV 96.9 80.0 - 100.0 fL   MCH 31.3 26.0 - 34.0 pg   MCHC 32.4 30.0 - 36.0 g/dL   RDW 16.5 (H) 11.5 - 15.5 %   Platelets 254 150 - 400 K/uL   nRBC 0.0 0.0 - 0.2 %    Comment: Performed at [facility], [place] [address] [place], [place] [address]  I-Stat beta hCG blood, ED     Status: None   Collection Time: 07/11/20  5:52 PM  Result Value Ref Range   I-stat hCG, quantitative <5.0 <5 mIU/mL   Comment 3            Comment:   GEST. AGE      CONC.  (mIU/mL)   <=1 WEEK        5 - 50     2 WEEKS       50 - 500     3 WEEKS       100 - 10,000     4 WEEKS     1,000 - 30,000        FEMALE AND NON-PREGNANT FEMALE:     LESS THAN 5 mIU/mL   Resp Panel by RT-PCR (Flu A&B, Covid) Nasopharyngeal Swab  Status: None   Collection Time: 07/13/20  5:08 AM   Specimen: Nasopharyngeal Swab; Nasopharyngeal(NP) swabs in vial transport medium  Result Value Ref Range   SARS Coronavirus 2 by RT PCR NEGATIVE NEGATIVE    Comment:  (NOTE) SARS-CoV-2 target nucleic acids are NOT DETECTED.  The SARS-CoV-2 RNA is generally detectable in upper respiratory specimens during the acute phase of infection. The lowest concentration of SARS-CoV-2 viral copies this assay can detect is 138 copies/mL. A negative result does not preclude SARS-Cov-2 infection and should not be used as the sole basis for treatment or other patient management decisions. A negative result may occur with  improper specimen collection/handling, submission of specimen other than nasopharyngeal swab, presence of viral mutation(s) within the areas targeted by this assay, and inadequate number of viral copies(<138 copies/mL). A negative result must be combined with clinical observations, patient history, and epidemiological information. The expected result is Negative.  Fact Sheet for Patients:  EntrepreneurPulse.com.au  Fact Sheet for Healthcare Providers:  IncredibleEmployment.be  This test is no t yet approved or cleared by the [place] FDA and  has been authorized for detection and/or diagnosis of SARS-CoV-2 by FDA under an Emergency Use Authorization (EUA). This EUA will remain  in effect (meaning this test can be used) for the duration of the COVID-19 declaration under Section 564(b)(1) of the Act, 21 U.S.C.section 360bbb-3(b)(1), unless the authorization is terminated  or revoked sooner.       Influenza A by PCR NEGATIVE NEGATIVE   Influenza B by PCR NEGATIVE NEGATIVE    Comment: (NOTE) The Xpert Xpress SARS-CoV-2/FLU/RSV plus assay is intended as an aid in the diagnosis of influenza from Nasopharyngeal swab specimens and should not be used as a sole basis for treatment. Nasal washings and aspirates are unacceptable for Xpert Xpress SARS-CoV-2/FLU/RSV testing.  Fact Sheet for Patients: EntrepreneurPulse.com.au  Fact Sheet for Healthcare  Providers: IncredibleEmployment.be  This test is not yet approved or cleared by the [place] FDA and has been authorized for detection and/or diagnosis of SARS-CoV-2 by FDA under an Emergency Use Authorization (EUA). This EUA will remain in effect (meaning this test can be used) for the duration of the COVID-19 declaration under Section 564(b)(1) of the Act, 21 U.S.C. section 360bbb-3(b)(1), unless the authorization is terminated or revoked.  Performed at [facility], [place] [address], [place], [place] [address]     Blood Alcohol level:  Lab Results  Component Value Date   ETH <10 07/11/2020   ETH <10 02/54/2706    Metabolic Disorder Labs:  Lab Results  Component Value Date   HGBA1C 5.5 07/09/2020   MPG 111.15 07/09/2020   MPG 111.15 01/24/2020   No results found for: PROLACTIN Lab Results  Component Value Date   CHOL 143 07/09/2020   TRIG 88 07/09/2020   HDL 65 07/09/2020   CHOLHDL 2.2 07/09/2020   VLDL 18 07/09/2020   LDLCALC 60 07/09/2020   LDLCALC 64 05/17/2020    Current Medications: Current Facility-Administered Medications  Medication Dose Route Frequency Provider Last Rate Last Admin   acetaminophen (TYLENOL) tablet 650 mg  650 mg Oral Q6H PRN Bobbitt, Shalon E, NP       alum & mag hydroxide-simeth (MAALOX/MYLANTA) 200-200-20 MG/5ML suspension 30 mL  30 mL Oral Q4H PRN Bobbitt, Shalon E, NP       hydrOXYzine (ATARAX/VISTARIL) tablet 25 mg  25 mg Oral TID PRN Bobbitt, Shalon E, NP       magnesium hydroxide (MILK OF  MAGNESIA) suspension 30 mL  30 mL Oral Daily PRN Bobbitt, Shalon E, NP       PTA Medications: Medications Prior to Admission  Medication Sig Dispense Refill Last Dose   amLODipine (NORVASC) 10 MG tablet Take 1 tablet (10 mg total) by mouth daily. 7 tablet 0    FLUoxetine (PROZAC) 20 MG capsule Take 40 mg by mouth daily.      FLUoxetine (PROZAC) 40 MG capsule Take 1 capsule (40 mg total) by mouth daily.  (Patient not taking: No sig reported) 7 capsule 0    gabapentin (NEURONTIN) 300 MG capsule Take 2 capsules (600 mg total) by mouth 3 (three) times daily. 180 capsule 1    ibuprofen (ADVIL) 200 MG tablet Take 400 mg by mouth every 6 (six) hours as needed for headache (pain).      prazosin (MINIPRESS) 1 MG capsule Take 3 capsules (3 mg total) by mouth at bedtime. 21 capsule 0    QUEtiapine (SEROQUEL) 200 MG tablet Take 2 tablets (400 mg total) by mouth at bedtime. (Patient not taking: No sig reported) 14 tablet 0    QUEtiapine (SEROQUEL) 400 MG tablet Take 400 mg by mouth at bedtime.      traZODone (DESYREL) 150 MG tablet Take 1 tablet (150 mg total) by mouth at bedtime. 7 tablet 0     Musculoskeletal: Strength & Muscle Tone: within normal limits Gait & Station: normal Patient leans: N/A  Psychiatric Specialty Exam:  Presentation  General Appearance: Casual  Eye Contact:Minimal  Speech:Clear and Coherent; Normal Rate  Speech Volume:Normal  Handedness:Right  Mood and Affect  Mood:Euthymic  Affect:Appropriate; Congruent   Thought Process  Thought Processes:Coherent; Goal Directed  Duration of Psychotic Symptoms: Greater than six months  Past Diagnosis of Schizophrenia or Psychoactive disorder: No  Descriptions of Associations:Intact  Orientation:Full (Time, Place and Person)  Thought Content:WDL  Hallucinations:No data recorded Ideas of Reference:None  Suicidal Thoughts:No data recorded Homicidal Thoughts:No data recorded  Sensorium  Memory:Immediate Good; Recent Good; Remote Good  Judgment:Fair  Insight:Fair  Executive Functions  Concentration:Good  Attention Span:Good  [place] of Knowledge:Good  Language:Good  Psychomotor Activity  Psychomotor Activity: No data recorded  Assets  Assets:Communication Skills; Desire for Improvement; Leisure Time; Social Support  Sleep  Sleep: No data recorded  Physical Exam: Physical Exam Vitals  and nursing note reviewed.  Constitutional:      Appearance: Normal appearance.  HENT:     Head: Normocephalic.  Pulmonary:     Effort: Pulmonary effort is normal.  Musculoskeletal:        General: Normal range of motion.     Cervical back: Normal range of motion.  Neurological:     General: No focal deficit present.     Mental Status: She is alert and oriented to person, place, and time.  Psychiatric:        Attention and Perception: Attention normal.        Mood and Affect: Mood is depressed.   Review of Systems  Constitutional:  Negative for fever.  HENT: Negative.  Negative for congestion, sinus pain and sore throat.   Respiratory:  Negative for cough and shortness of breath.   Cardiovascular: Negative.  Negative for chest pain.  Gastrointestinal: Negative.   Genitourinary: Negative.   Musculoskeletal: Negative.   Neurological: Negative.   Blood pressure 121/89, pulse 90, temperature 98 F (36.7 C), temperature source Oral, resp. rate 18, height '5\' 6"'  (1.676 m), weight 95.3 kg, last menstrual period 06/19/2018, SpO2  98 %. Body mass index is 33.89 kg/m.    Observation Level/Precautions:  15 minute checks  Laboratory:   Labs reviewed from 6/22 and 6/24:  CMP: Glucose 96, AST 14, CBC with Diff: RBC 3.86, RDW 16.5. Acetaminophen <53, Salicylate <7. A1c 5.5. TSH 0.808. Lipid profile within normal limits. EKG ordered.   Psychotherapy:  Group Therapy  Medications:  See MAR  Consultations:  TBD  Discharge Concerns:  safety, medication compliance, substance abuse and housing  Estimated LOS: 3-5 days  Other:      Physician Treatment Plan for Primary Diagnosis: MDD (major depressive disorder), recurrent severe, without psychosis ([place]) Long Term Goal(s): Improvement in symptoms so as ready for discharge  Short Term Goals: Ability to identify changes in lifestyle to reduce recurrence of condition will improve, Ability to disclose and discuss suicidal ideas, Ability to identify and  develop effective coping behaviors will improve, Compliance with prescribed medications will improve, and Ability to identify triggers associated with substance abuse/mental health issues will improve  Physician Treatment Plan for Secondary Diagnosis: Principal Problem:   MDD (major depressive disorder), recurrent severe, without psychosis ([place]) Active Problems:   Cocaine-induced mood disorder ([place])   Polysubstance abuse ([place])  Long Term Goal(s): Improvement in symptoms so as ready for discharge  Short Term Goals: Ability to identify changes in lifestyle to reduce recurrence of condition will improve, Ability to verbalize feelings will improve, Ability to disclose and discuss suicidal ideas, Ability to identify and develop effective coping behaviors will improve, Compliance with prescribed medications will improve, and Ability to identify triggers associated with substance abuse/mental health issues will improve  Treatment Plan Summary: Daily contact with patient to assess and evaluate symptoms and progress in treatment and Medication management  Restart home medications as indicated below:  Major Depressive Disorder: - Prozac 40 mg PO daily -Seroquel 400 mg PO at bedtime  Nightmares/Disturbed sleep:  -Minipress 3 mg PO at bedtime  Insomnia: -Trazodone 150 mg PO at bedtime  Cocaine withdrawal:  -Gabapentin 600 mg PO TID  Hypertension: Norvasc 10 mg PO daily  Continue every 15 minute safety checks Encourage participation in the therapeutic milieu Discharge planning in progress  I certify that inpatient services furnished can reasonably be expected to improve the patient's condition.    Ethelene Hal, NP 6/26/202211:56 AM

## 2020-07-13 NOTE — Progress Notes (Signed)
EKG results placed on the outside of pt's shadow chart  Normal sinus rhythm Normal ECG  QT/Qtc  428/471 ms
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
## 2020-07-13 NOTE — ED Notes (Signed)
Pt provided with a sandwich and a sprite.

## 2020-07-13 NOTE — [facility] Group Notes (Signed)
[facility] Group Notes:  (Nursing/MHT/Case Management/Adjunct)   Date:  07/13/2020  Time:  4:06 PM   Type of Therapy:  Nurse Education   Participation Level:  Did Not Attend   Participation Quality:               Affect:     Cognitive:     Insight:     Engagement in Group:     Modes of Intervention:     Summary of Progress/Problems:  Did not attend group despite staff invitation.   Karilyn Wind V Sidi Dzikowski 

## 2020-07-13 NOTE — ED Notes (Signed)
[facility] called to transport pt to [facility].  

## 2020-07-13 NOTE — ED Notes (Addendum)
Per [name] pt has been accepted to [facility] pending negative COVID and assigned to room/bed: 403-2. Pt can come after 0800. Attending physician. Dr. Mason Jim.

## 2020-07-14 LAB — URINALYSIS, COMPLETE (UACMP) WITH MICROSCOPIC
Bacteria, UA: NONE SEEN
Bilirubin Urine: NEGATIVE
Glucose, UA: NEGATIVE mg/dL
Hgb urine dipstick: NEGATIVE
Ketones, ur: NEGATIVE mg/dL
Leukocytes,Ua: NEGATIVE
Nitrite: NEGATIVE
Protein, ur: NEGATIVE mg/dL
Specific Gravity, Urine: 1.015 (ref 1.005–1.030)
pH: 7 (ref 5.0–8.0)

## 2020-07-14 NOTE — [facility] Group Notes (Signed)
Patient did not attend group   ADULT GRIEF GROUP NOTE:   Spiritual care group on grief and loss facilitated by chaplain Katy Tinika Bucknam, [facility]   Group Goal:   Support / Education around grief and loss   Members engage in facilitated group support and psycho-social education.   Group Description:   Following introductions and group rules, group members engaged in facilitated group dialog and support around topic of loss, with particular support around experiences of loss in their lives. Group Identified types of loss (relationships / self / things) and identified patterns, circumstances, and changes that precipitate losses. Reflected on thoughts / feelings around loss, normalized grief responses, and recognized variety in grief experience. Group noted Worden's four tasks of grief in discussion.   Group drew on Adlerian / Rogerian, narrative, MI,    

## 2020-07-14 NOTE — Tx Team (Signed)
Interdisciplinary Treatment and Diagnostic Plan Update  07/14/2020 Time of Session: 11:15am Rachel Nolan MRN: [id number]  Principal Diagnosis: MDD (major depressive disorder), recurrent severe, without psychosis ([place])  Secondary Diagnoses: Principal Problem:   MDD (major depressive disorder), recurrent severe, without psychosis ([place]) Active Problems:   Cocaine-induced mood disorder ([place])   Polysubstance abuse ([place])   Current Medications:  Current Facility-Administered Medications  Medication Dose Route Frequency Provider Last Rate Last Admin   acetaminophen (TYLENOL) tablet 650 mg  650 mg Oral Q6H PRN Bobbitt, Shalon E, NP   650 mg at 07/13/20 2130   alum & mag hydroxide-simeth (MAALOX/MYLANTA) 200-200-20 MG/5ML suspension 30 mL  30 mL Oral Q4H PRN Bobbitt, Shalon E, NP       amLODipine (NORVASC) tablet 10 mg  10 mg Oral Daily Ethelene Hal, NP   10 mg at 07/14/20 0830   FLUoxetine (PROZAC) capsule 40 mg  40 mg Oral Daily Ethelene Hal, NP   40 mg at 07/14/20 0831   gabapentin (NEURONTIN) tablet 600 mg  600 mg Oral TID Ethelene Hal, NP   600 mg at 07/14/20 1315   hydrOXYzine (ATARAX/VISTARIL) tablet 25 mg  25 mg Oral TID PRN Bobbitt, Hessie Diener E, NP   25 mg at 07/14/20 2751   ibuprofen (ADVIL) tablet 400 mg  400 mg Oral Q6H PRN Ethelene Hal, NP   400 mg at 07/13/20 1812   magnesium hydroxide (MILK OF MAGNESIA) suspension 30 mL  30 mL Oral Daily PRN Bobbitt, Shalon E, NP       prazosin (MINIPRESS) capsule 3 mg  3 mg Oral QHS Ethelene Hal, NP   3 mg at 07/13/20 2131   QUEtiapine (SEROQUEL) tablet 400 mg  400 mg Oral QHS Ethelene Hal, NP   400 mg at 07/13/20 2131   traZODone (DESYREL) tablet 150 mg  150 mg Oral QHS Ethelene Hal, NP   150 mg at 07/13/20 2131   PTA Medications: Medications Prior to Admission  Medication Sig Dispense Refill Last Dose   amLODipine (NORVASC) 10 MG tablet Take 1 tablet (10 mg total) by mouth daily. 7  tablet 0    FLUoxetine (PROZAC) 20 MG capsule Take 40 mg by mouth daily.      FLUoxetine (PROZAC) 40 MG capsule Take 1 capsule (40 mg total) by mouth daily. (Patient not taking: No sig reported) 7 capsule 0    gabapentin (NEURONTIN) 300 MG capsule Take 2 capsules (600 mg total) by mouth 3 (three) times daily. 180 capsule 1    ibuprofen (ADVIL) 200 MG tablet Take 400 mg by mouth every 6 (six) hours as needed for headache (pain).      prazosin (MINIPRESS) 1 MG capsule Take 3 capsules (3 mg total) by mouth at bedtime. 21 capsule 0    QUEtiapine (SEROQUEL) 200 MG tablet Take 2 tablets (400 mg total) by mouth at bedtime. (Patient not taking: No sig reported) 14 tablet 0    QUEtiapine (SEROQUEL) 400 MG tablet Take 400 mg by mouth at bedtime.      traZODone (DESYREL) 150 MG tablet Take 1 tablet (150 mg total) by mouth at bedtime. 7 tablet 0     Patient Stressors: Financial difficulties Health problems Loss of Nephew  Patient Strengths: Curator fund of knowledge Supportive family/friends  Treatment Modalities: Medication Management, Group therapy, Case management,  1 to 1 session with clinician, Psychoeducation, Recreational therapy.   Physician Treatment Plan for Primary Diagnosis: MDD (major depressive disorder),  recurrent severe, without psychosis ([place]) Long Term Goal(s): Improvement in symptoms so as ready for discharge   Short Term Goals: Ability to identify changes in lifestyle to reduce recurrence of condition will improve Ability to verbalize feelings will improve Ability to disclose and discuss suicidal ideas Ability to identify and develop effective coping behaviors will improve Compliance with prescribed medications will improve Ability to identify triggers associated with substance abuse/mental health issues will improve  Medication Management: Evaluate patient's response, side effects, and tolerance of medication regimen.  Therapeutic Interventions: 1 to 1  sessions, Unit Group sessions and Medication administration.  Evaluation of Outcomes: Not Met  Physician Treatment Plan for Secondary Diagnosis: Principal Problem:   MDD (major depressive disorder), recurrent severe, without psychosis ([place]) Active Problems:   Cocaine-induced mood disorder ([place])   Polysubstance abuse ([place])  Long Term Goal(s): Improvement in symptoms so as ready for discharge   Short Term Goals: Ability to identify changes in lifestyle to reduce recurrence of condition will improve Ability to verbalize feelings will improve Ability to disclose and discuss suicidal ideas Ability to identify and develop effective coping behaviors will improve Compliance with prescribed medications will improve Ability to identify triggers associated with substance abuse/mental health issues will improve     Medication Management: Evaluate patient's response, side effects, and tolerance of medication regimen.  Therapeutic Interventions: 1 to 1 sessions, Unit Group sessions and Medication administration.  Evaluation of Outcomes: Not Met   RN Treatment Plan for Primary Diagnosis: MDD (major depressive disorder), recurrent severe, without psychosis ([place]) Long Term Goal(s): Knowledge of disease and therapeutic regimen to maintain health will improve  Short Term Goals: Ability to remain free from injury will improve, Ability to verbalize frustration and anger appropriately will improve, Ability to identify and develop effective coping behaviors will improve, and Compliance with prescribed medications will improve  Medication Management: RN will administer medications as ordered by provider, will assess and evaluate patient's response and provide education to patient for prescribed medication. RN will report any adverse and/or side effects to prescribing provider.  Therapeutic Interventions: 1 on 1 counseling sessions, Psychoeducation, Medication administration, Evaluate responses to treatment,  Monitor vital signs and CBGs as ordered, Perform/monitor CIWA, COWS, AIMS and Fall Risk screenings as ordered, Perform wound care treatments as ordered.  Evaluation of Outcomes: Not Met   LCSW Treatment Plan for Primary Diagnosis: MDD (major depressive disorder), recurrent severe, without psychosis ([place]) Long Term Goal(s): Safe transition to appropriate next level of care at discharge, Engage patient in therapeutic group addressing interpersonal concerns.  Short Term Goals: Engage patient in aftercare planning with referrals and resources, Increase social support, Increase ability to appropriately verbalize feelings, Identify triggers associated with mental health/substance abuse issues, and Increase skills for wellness and recovery  Therapeutic Interventions: Assess for all discharge needs, 1 to 1 time with Social worker, Explore available resources and support systems, Assess for adequacy in community support network, Educate family and significant other(s) on suicide prevention, Complete Psychosocial Assessment, Interpersonal group therapy.  Evaluation of Outcomes: Not Met   Progress in Treatment: Attending groups: No. Participating in groups: No. Taking medication as prescribed: Yes. Toleration medication: Yes. Family/Significant other contact made: No, will contact:  friend Patient understands diagnosis: Yes. Discussing patient identified problems/goals with staff: Yes. Medical problems stabilized or resolved: Yes. Denies suicidal/homicidal ideation: Yes. Issues/concerns per patient self-inventory: No.   New problem(s) identified: No, Describe:  none  New Short Term/Long Term Goal(s): detox, medication management for mood stabilization; elimination of SI thoughts;  development of comprehensive mental wellness/sobriety plan  Patient Goals:  Did not attend  Discharge Plan or Barriers: Patient recently admitted. CSW will continue to follow and assess for appropriate referrals and  possible discharge planning.    Reason for Continuation of Hospitalization: Delusions  Hallucinations Medication stabilization Suicidal ideation Withdrawal symptoms  Estimated Length of Stay: 3-5 days  Attendees: Patient: Did not attend 07/14/2020   Physician:  07/14/2020  Nursing:  07/14/2020   RN Care Manager: 07/14/2020   Social Worker: Darletta Moll, LCSW 07/14/2020   Recreational Therapist:  07/14/2020   Other:  07/14/2020   Other:  07/14/2020   Other: 07/14/2020     Scribe for Treatment Team: Vassie Moselle, LCSW 07/14/2020 2:38 PM

## 2020-07-14 NOTE — Progress Notes (Addendum)
[facility] MD Progress Note  07/14/2020 5:56 PM Rachel Nolan  MRN:  [id number] Subjective:  "I am ok, just laying here thinking of ways to do it"   Objective: Patient is a 50 year old female who was admitted to [facility] from [facility] for suicidal ideation and cocaine abuse. Patient has been seen and evaluated at [facility] 6-22 to 6-23, , [facility] as a walk-in with her ACT team member on 6-23 and [facility] on 6-24 for the same presentation, not having her medications and being suicidal. She was discharged from [facility] and [facility] but recommended for inpatient on 6-24 when presenting to [facility] after her assessment with TTS. She has told different variations of the same story each time but basically she claims to have been off her medications for 2-3 weeks, hearing voices and having suicidal ideation with a plan to shoot herself or overdose. Patient's UDS was positive for  cocaine and THC. Patient has a long history of cocaine use and does not want rehab. She stated "cocaine is not a problem, I only use it once a week." Patient reported she slept good last night, she slept 5.25 hours. She reported a good appetite. Patient was restarted on her home medications and is taking them without issue. She stated "my medications really help me when I have them." Patient has an ACT team with [facility]. Patient is mostly homeless but is apparently living in a group house that has a curfew of 7 PM and she does not like this. Patient denies AH & VH today. She denies homicidal ideation. She stated she is having suicidal thoughts and is laying in her bed thinking of how she would do it but had no specific plan. There were no new labs today and no medication changes. Her vital signs are stable, she is afebrile. Will continue to monitor and provide support.   Principal Problem: MDD (major depressive disorder), recurrent, severe, with psychosis ([facility]) Diagnosis: Principal Problem:   MDD (major depressive disorder), recurrent, severe, with psychosis  ([facility]) Active Problems:   Cocaine-induced mood disorder ([facility])   Polysubstance abuse ([facility])  Total Time spent with patient:  25 minutes  Past Psychiatric History: See H&P  Past Medical History:  Past Medical History:  Diagnosis Date   Anxiety    Asthma    Depression    Hypertension    Scoliosis     Past Surgical History:  Procedure Laterality Date   BACK SURGERY     ECTOPIC PREGNANCY SURGERY     Family History:  Family History  Problem Relation Age of Onset   Hypertension Mother    Hypertension Father    Family Psychiatric  History: See H&P Social History:  Social History   Substance and Sexual Activity  Alcohol Use Yes   Comment: one 40 oz per month     Social History   Substance and Sexual Activity  Drug Use Yes   Types: Marijuana, Cocaine, "Crack" cocaine    Social History   Socioeconomic History   Marital status: Widowed    Spouse name: Not on file   Number of children: Not on file   Years of education: Not on file   Highest education level: Not on file  Occupational History   Not on file  Tobacco Use   Smoking status: Every Day    Packs/day: 1.00    Pack years: 0.00    Types: Cigarettes   Smokeless tobacco: Never  Vaping Use   Vaping Use: Never used  Substance and Sexual Activity   Alcohol use: Yes    Comment: one 40 oz per month   Drug use: Yes    Types: Marijuana, Cocaine, "Crack" cocaine   Sexual activity: Not Currently  Other Topics Concern   Not on file  Social History Narrative   Pt is homeless, no fixed address; not followed by an outpatient psychiatrist   Social Determinants of Health   Financial Resource Strain: Not on file  Food Insecurity: Not on file  Transportation Needs: Not on file  Physical Activity: Not on file  Stress: Not on file  Social Connections: Not on file   Additional Social History:    Sleep: Good  Appetite:  Good  Current Medications: Current Facility-Administered Medications  Medication Dose Route  Frequency Provider Last Rate Last Admin   acetaminophen (TYLENOL) tablet 650 mg  650 mg Oral Q6H PRN Bobbitt, Shalon E, NP   650 mg at 07/13/20 2130   alum & mag hydroxide-simeth (MAALOX/MYLANTA) 200-200-20 MG/5ML suspension 30 mL  30 mL Oral Q4H PRN Bobbitt, Shalon E, NP       amLODipine (NORVASC) tablet 10 mg  10 mg Oral Daily Laveda Abbe, NP   10 mg at 07/14/20 0830   FLUoxetine (PROZAC) capsule 40 mg  40 mg Oral Daily Laveda Abbe, NP   40 mg at 07/14/20 0831   gabapentin (NEURONTIN) tablet 600 mg  600 mg Oral TID Laveda Abbe, NP   600 mg at 07/14/20 1650   hydrOXYzine (ATARAX/VISTARIL) tablet 25 mg  25 mg Oral TID PRN Roselyn Bering E, NP   25 mg at 07/14/20 7616   ibuprofen (ADVIL) tablet 400 mg  400 mg Oral Q6H PRN Laveda Abbe, NP   400 mg at 07/13/20 1812   magnesium hydroxide (MILK OF MAGNESIA) suspension 30 mL  30 mL Oral Daily PRN Bobbitt, Shalon E, NP       prazosin (MINIPRESS) capsule 3 mg  3 mg Oral QHS Laveda Abbe, NP   3 mg at 07/13/20 2131   QUEtiapine (SEROQUEL) tablet 400 mg  400 mg Oral QHS Laveda Abbe, NP   400 mg at 07/13/20 2131   traZODone (DESYREL) tablet 150 mg  150 mg Oral QHS Laveda Abbe, NP   150 mg at 07/13/20 2131    Lab Results:  Results for orders placed or performed during the hospital encounter of 07/13/20 (from the past 48 hour(s))  Urinalysis, Complete w Microscopic Urine, Clean Catch     Status: Abnormal   Collection Time: 07/14/20  6:00 AM  Result Value Ref Range   Color, Urine STRAW (A) YELLOW   APPearance CLEAR CLEAR   Specific Gravity, Urine 1.015 1.005 - 1.030   pH 7.0 5.0 - 8.0   Glucose, UA NEGATIVE NEGATIVE mg/dL   Hgb urine dipstick NEGATIVE NEGATIVE   Bilirubin Urine NEGATIVE NEGATIVE   Ketones, ur NEGATIVE NEGATIVE mg/dL   Protein, ur NEGATIVE NEGATIVE mg/dL   Nitrite NEGATIVE NEGATIVE   Leukocytes,Ua NEGATIVE NEGATIVE   WBC, UA 0-5 0 - 5 WBC/hpf   Bacteria, UA NONE  SEEN NONE SEEN   Squamous Epithelial / LPF 0-5 0 - 5   Mucus PRESENT     Comment: Performed at [facility], [address] [place], [place] [address]    Blood Alcohol level:  Lab Results  Component Value Date   [facility] <10 07/11/2020   ETH <10 07/09/2020    Metabolic Disorder Labs: Lab  Results  Component Value Date   HGBA1C 5.5 07/09/2020   MPG 111.15 07/09/2020   MPG 111.15 01/24/2020   No results found for: PROLACTIN Lab Results  Component Value Date   CHOL 143 07/09/2020   TRIG 88 07/09/2020   HDL 65 07/09/2020   CHOLHDL 2.2 07/09/2020   VLDL 18 07/09/2020   LDLCALC 60 07/09/2020   LDLCALC 64 05/17/2020    Physical Findings: AIMS: Facial and Oral Movements Muscles of Facial Expression: None, normal Lips and Perioral Area: None, normal Jaw: None, normal Tongue: None, normal,Extremity Movements Upper (arms, wrists, hands, fingers): None, normal Lower (legs, knees, ankles, toes): None, normal, Trunk Movements Neck, shoulders, hips: None, normal, Overall Severity Severity of abnormal movements (highest score from questions above): None, normal Incapacitation due to abnormal movements: None, normal Patient's awareness of abnormal movements (rate only patient's report): No Awareness, Dental Status Current problems with teeth and/or dentures?: No Does patient usually wear dentures?: No  CIWA:    COWS:     Musculoskeletal: Strength & Muscle Tone: within normal limits Gait & Station: normal Patient leans: N/A  Psychiatric Specialty Exam:  Presentation  General Appearance: Casual  Eye Contact:Minimal  Speech:Clear and Coherent; Normal Rate  Speech Volume:Normal  Handedness:Right   Mood and Affect  Mood:Euthymic  Affect:Appropriate; Congruent   Thought Process  Thought Processes:Coherent; Goal Directed  Descriptions of Associations:Intact  Orientation:Full (Time, Place and Person)  Thought Content:WDL  History of  Schizophrenia/Schizoaffective disorder:No  Duration of Psychotic Symptoms:Greater than six months  Hallucinations:No data recorded Ideas of Reference:None  Suicidal Thoughts:No data recorded Homicidal Thoughts:No data recorded  Sensorium  Memory:Immediate Good; Recent Good; Remote Good  Judgment:Fair  Insight:Fair  Executive Functions  Concentration:Good  Attention Span:Good  Recall:Good  Fund of Knowledge:Good  Language:Good  Psychomotor Activity  Psychomotor Activity: No data recorded  Assets  Assets:Communication Skills; Desire for Improvement; Leisure Time; Social Support  Sleep  Sleep: No data recorded  Physical Exam: Physical Exam Vitals and nursing note reviewed.  HENT:     Head: Normocephalic.  Pulmonary:     Effort: Pulmonary effort is normal.  Musculoskeletal:        General: Normal range of motion.     Cervical back: Normal range of motion.  Neurological:     General: No focal deficit present.     Mental Status: She is alert and oriented to person, place, and time.  Psychiatric:        Attention and Perception: Attention normal.        Mood and Affect: Mood is anxious and depressed. Affect is flat.        Speech: Speech normal.        Behavior: Behavior is slowed.        Thought Content: Thought content is not paranoid or delusional. Thought content does not include homicidal or suicidal ideation. Thought content does not include homicidal or suicidal plan.        Cognition and Memory: Cognition normal.   Review of Systems  Constitutional: Negative.  Negative for fever.  HENT:  Negative for congestion, sinus pain and sore throat.   Respiratory:  Negative for cough and shortness of breath.   Cardiovascular: Negative.  Negative for chest pain.  Gastrointestinal: Negative.   Genitourinary: Negative.   Musculoskeletal: Negative.   Neurological: Negative.   Blood pressure (!) 128/95, pulse (!) 105, temperature 98.2 F (36.8 C), temperature  source Oral, resp. rate 18, height 5\' 6"  (1.676 m), weight 95.3 kg, last menstrual  period 06/19/2018, SpO2 99 %. Body mass index is 33.89 kg/m.   Treatment Plan Summary: Daily contact with patient to assess and evaluate symptoms and progress in treatment and Medication management  Major Depressive Disorder: - Prozac 40 mg PO daily -Seroquel 400 mg PO at bedtime   Nightmares/Disturbed sleep: -Minipress 3 mg PO at bedtime   Insomnia: -Trazodone 150 mg PO at bedtime   Cocaine withdrawal: -Gabapentin 600 mg PO TID   Hypertension: Norvasc 10 mg PO daily   Continue every 15 minute safety checks Encourage participation in the therapeutic milieu Discharge planning in progress   Comer Locket, MD 07/14/2020, 5:56 PM

## 2020-07-14 NOTE — Progress Notes (Signed)
   07/14/20 1638  Vital Signs  Temp 98.2 F (36.8 C)  Temp Source Oral  Pulse Rate 85  Pulse Rate Source Monitor  BP 114/82  BP Location Right Arm  BP Method Automatic  Patient Position (if appropriate) Sitting  Oxygen Therapy  SpO2 99 %   D: Patient admits to some SI, but denies HI. Pt admits to hearing voices. Pt rated anxiety 8/10 and depression 10/10.  Pt. Isolated in her room. A:  Patient took scheduled medicine.  Support and encouragement provided Routine safety checks conducted every 15 minutes. Patient  Informed to notify staff with any concerns.   R: Safety maintained.

## 2020-07-14 NOTE — Progress Notes (Signed)
Recreation Therapy Notes  Date:  6.27.22 Time: 0930  Location: [address] Dayroom  Group Topic: Stress Management  Goal Area(s) Addresses:  Patient will identify positive stress management techniques. Patient will identify benefits of using stress management post d/c.  Intervention: Stress Management  Activity : Meditation.  LRT played a meditation that focused on being resilient when faced with adversity.     Education:  Stress Management, Discharge Planning.   Education Outcome: Acknowledges Education  Clinical Observations/Feedback: Pt did not attend group.    Caroll Rancher, LRT/CTRS         Caroll Rancher A 07/14/2020 12:47 PM

## 2020-07-14 NOTE — Progress Notes (Signed)
   07/14/20 2124  Psych Admission Type (Psych Patients Only)  Admission Status Voluntary  Psychosocial Assessment  Patient Complaints Anxiety;Self-harm thoughts  Eye Contact Brief  Facial Expression Flat;Pained  Affect Appropriate to circumstance;Depressed  Speech Logical/coherent  Interaction Assertive  Motor Activity Unsteady;Slow  Appearance/Hygiene In scrubs  Behavior Characteristics Cooperative;Appropriate to situation  Mood Depressed;Pleasant  Thought Process  Coherency WDL  Content WDL  Delusions None reported or observed  Perception WDL  Hallucination None reported or observed  Judgment WDL  Confusion None  Danger to Self  Current suicidal ideation? Passive  Self-Injurious Behavior No self-injurious ideation or behavior indicators observed or expressed   Agreement Not to Harm Self Yes  Description of Agreement Verbal contract  Danger to Others  Danger to Others None reported or observed
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
## 2020-07-14 NOTE — Progress Notes (Signed)
   07/13/20 2200  Psych Admission Type (Psych Patients Only)  Admission Status Voluntary  Psychosocial Assessment  Patient Complaints None  Eye Contact Brief  Facial Expression Flat  Affect Appropriate to circumstance  Speech Logical/coherent  Interaction Assertive  Motor Activity Slow  Appearance/Hygiene In scrubs  Behavior Characteristics Cooperative;Appropriate to situation  Mood Pleasant  Aggressive Behavior  Effect No apparent injury  Thought Process  Coherency WDL  Content WDL  Delusions None reported or observed  Perception WDL  Hallucination None reported or observed  Judgment WDL  Confusion None  Danger to Self  Current suicidal ideation? Denies  Self-Injurious Behavior No self-injurious ideation or behavior indicators observed or expressed   Agreement Not to Harm Self Yes  Description of Agreement verbal  Danger to Others  Danger to Others None reported or observed      07/13/20 2200  Psych Admission Type (Psych Patients Only)  Admission Status Voluntary  Psychosocial Assessment  Patient Complaints None  Eye Contact Brief  Facial Expression Flat  Affect Appropriate to circumstance  Speech Logical/coherent  Interaction Assertive  Motor Activity Slow  Appearance/Hygiene In scrubs  Behavior Characteristics Cooperative;Appropriate to situation  Mood Pleasant  Aggressive Behavior  Effect No apparent injury  Thought Process  Coherency WDL  Content WDL  Delusions None reported or observed  Perception WDL  Hallucination None reported or observed  Judgment WDL  Confusion None  Danger to Self  Current suicidal ideation? Denies  Self-Injurious Behavior No self-injurious ideation or behavior indicators observed or expressed   Agreement Not to Harm Self Yes  Description of Agreement verbal  Danger to Others  Danger to Others None reported or observed

## 2020-07-14 NOTE — Progress Notes (Signed)
Pt did not attend goals group. 

## 2020-07-14 NOTE — Progress Notes (Signed)
The focus of this group is to help patients review their daily goal of treatment and discuss progress on daily workbooks.  Pt did not attend the evening group session. 

## 2020-07-14 NOTE — BHH Group Notes (Signed)
Type of Therapy and Topic:  Group Therapy - Healthy vs Unhealthy Coping Skills  Participation Level:  Did not attend   Description of Group The focus of this group was to determine what unhealthy coping techniques typically are used by group members and what healthy coping techniques would be helpful in coping with various problems. Patients were guided in becoming aware of the differences between healthy and unhealthy coping techniques. Patients were asked to identify 2-3 healthy coping skills they would like to learn to use more effectively.  Therapeutic Goals Patients learned that coping is what human beings do all day long to deal with various situations in their lives Patients defined and discussed healthy vs unhealthy coping techniques Patients identified their preferred coping techniques and identified whether these were healthy or unhealthy Patients determined 2-3 healthy coping skills they would like to become more familiar with and use more often. Patients provided support and ideas to each other   Summary of Patient Progress:  Did not attend    Therapeutic Modalities Cognitive Behavioral Therapy Motivational Interviewing 

## 2020-07-14 NOTE — BHH Group Notes (Signed)
Occupational Therapy Group Note Date: 07/14/2020 Group Topic/Focus: Stress Management  Group Description: Group encouraged increased participation and engagement through discussion focused on topic of stress management. Patients engaged interactively to discuss components of stress including physical signs, emotional signs, negative management strategies, and positive management strategies. Each individual identified one new stress management strategy they would like to try moving forward.    Therapeutic Goals: Identify current stressors Identify healthy vs unhealthy stress management strategies/techniques Discuss and identify physical and emotional signs of stress Participation Level: Patient did not attend OT group session despite personal invitation.     Plan: Continue to engage patient in OT groups 2 - 3x/week.  07/14/2020  Alan Riles, MOT, OTR/L 

## 2020-07-15 DIAGNOSIS — F333 Major depressive disorder, recurrent, severe with psychotic symptoms: Principal | ICD-10-CM

## 2020-07-15 MED ORDER — LOPERAMIDE HCL 2 MG PO CAPS
ORAL_CAPSULE | ORAL | Status: AC
  Filled 2020-07-15: qty 2

## 2020-07-15 MED ORDER — LOPERAMIDE HCL 2 MG PO CAPS
4.0000 mg | ORAL_CAPSULE | Freq: Once | ORAL | Status: AC
  Administered 2020-07-15: 4 mg via ORAL
  Filled 2020-07-15: qty 2

## 2020-07-15 MED ORDER — ONDANSETRON 4 MG PO TBDP
ORAL_TABLET | ORAL | Status: AC
  Filled 2020-07-15: qty 1

## 2020-07-15 MED ORDER — ONDANSETRON 4 MG PO TBDP
4.0000 mg | ORAL_TABLET | Freq: Once | ORAL | Status: AC
  Administered 2020-07-15: 4 mg via ORAL
  Filled 2020-07-15: qty 1
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
## 2020-07-15 NOTE — BHH Counselor (Signed)
CSW spoke with Rachel Nolan from [facility] [phone].  He states that Rachel Nolan has been experiencing SI and HI due to depression from being denied Disability benefits and from being homeless for the past year.  Rachel Nolan states that Rachel Nolan has been living in an abandoned building but the building is not being renovated and she can no longer stay there.  He states that the ACTT team has been experiencing difficulties with finding shelter for Rachel Nolan.  CSW informed Rachel Nolan that she would provide Rachel Nolan with some shelter resources and upon discharge will contact Rachel Nolan to let him know where Rachel Nolan will go.  Rachel Nolan states that the ACTT Team will come to see Rachel Nolan once she is discharged for the hospital.

## 2020-07-15 NOTE — [facility] Suicide Risk Assessment (Signed)
[facility] INPATIENT:  Family/Significant Other Suicide Prevention Education  Suicide Prevention Education:  Education Completed; Jolee Ewing [phone] (Friend) has been identified by the patient as the family member/significant other with whom the patient will be residing, and identified as the person(s) who will aid the patient in the event of a mental health crisis (suicidal ideations/suicide attempt).  With written consent from the patient, the family member/significant other has been provided the following suicide prevention education, prior to the and/or following the discharge of the patient.  The suicide prevention education provided includes the following: Suicide risk factors Suicide prevention and interventions National Suicide Hotline telephone number [facility] assessment telephone number [facility] Emergency Assistance [facility] and/or Residential Mobile Crisis Unit telephone number  Request made of family/significant other to: Remove weapons (e.g., guns, rifles, knives), all items previously/currently identified as safety concern.   Remove drugs/medications (over-the-counter, prescriptions, illicit drugs), all items previously/currently identified as a safety concern.  The family member/significant other verbalizes understanding of the suicide prevention education information provided.  The family member/significant other agrees to remove the items of safety concern listed above.  CSW spoke with Mr. Quillian Quince who states that he and Ms. Watterson use to date but no longer do.  He states that Ms. Fuquay cannot stay with him and was only staying there from time to time because he wanted to help her out.  Mr. Quillian Quince states that Ms. Gwilliam cannot go to her sister's home because they are not getting along.  He states that he is storing Ms. Linville's belongings in some older used cars in his yard until she gets a Chief Strategy Officer.  Mr. Quillian Quince states  that he is not aware of any weapons or firearms in Ms. Alkhatib's possession.  CSW completed SPE with Mr. Quillian Quince.   Metro Kung Davin Archuletta 07/15/2020, 12:50 PM

## 2020-07-15 NOTE — [facility] Group Notes (Signed)
[facility] Group Notes:  (Nursing/MHT/Case Management/Adjunct)   Date:  07/15/2020  Time:  11:40 AM   Type of Therapy:   Orientation group   Participation Level:  Did Not Attend   Participation Quality:               Affect:     Cognitive:     Insight:     Engagement in Group:     Modes of Intervention:  Discussion and Education   Summary of Progress/Problems:  Did not attend despite staff encouragement.   Anari Evitt V Geralynn Capri 07/15/2020, 11:40 AM 

## 2020-07-15 NOTE — Progress Notes (Addendum)
   07/15/20 2000  Psych Admission Type (Psych Patients Only)  Admission Status Voluntary  Psychosocial Assessment  Patient Complaints Depression  Eye Contact Brief  Facial Expression Other (Comment) (appropriate)  Affect Appropriate to circumstance;Depressed  Speech Logical/coherent  Interaction Assertive  Motor Activity Slow  Appearance/Hygiene In hospital gown  Behavior Characteristics Cooperative;Guarded  Mood Depressed;Pleasant  Thought Process  Coherency WDL  Content WDL  Delusions None reported or observed  Perception WDL  Hallucination None reported or observed  Judgment WDL  Confusion None  Danger to Self  Current suicidal ideation? Passive  Self-Injurious Behavior No self-injurious ideation or behavior indicators observed or expressed   Agreement Not to Harm Self Yes  Description of Agreement Verbally contracts for safety  Danger to Others  Danger to Others None reported or observed   Pt seen in her room. Pt endorses passive SI but contracts for safety on the unit. Pt denies HI, AVH. Rates pain 8/10 in her right shoulder. Pt given heat pack. Pt denies anxiety but rates depression 10/10. "It's always that high."

## 2020-07-15 NOTE — Progress Notes (Signed)
Pt endorsed passive SI without plan. Verbally contracts for safety. Current stressors being "I don't know what to do after I leave here and just hearing my nephew's voice and seeing my nephew's face". Observed in bed majority of this morning in bed with emesis X1, diarrhea X3 and bodyaches. Pt received Zofran 4 mg and Imodium 4 mg once along with PRN Motrin for pain this morning. Observed in hall on phone with brighter affect this evening while communicating with family. Had her card picked up in lobby by her family member. Denies HI when assessed.  All medications given with verbal education and effects monitored. Safety checks maintained at Q 15 minutes intervals without outburst or self harm gestures. Emotional support and encouragement offered to pt this shift. Pt did not attend scheduled groups. Denies concerns at this time. Remains safe on unit.

## 2020-07-16 MED ORDER — TRAZODONE HCL 100 MG PO TABS
100.0000 mg | ORAL_TABLET | Freq: Every day | ORAL | Status: DC
Start: 2020-07-16 — End: 2020-07-18
  Administered 2020-07-16 – 2020-07-17 (×2): 100 mg via ORAL
  Filled 2020-07-16 (×3): qty 1

## 2020-07-16 MED ORDER — QUETIAPINE FUMARATE 300 MG PO TABS
600.0000 mg | ORAL_TABLET | Freq: Every day | ORAL | Status: DC
  Administered 2020-07-16 – 2020-07-17 (×2): 600 mg via ORAL
  Filled 2020-07-16 (×3): qty 2

## 2020-07-16 MED ORDER — HYDROXYZINE HCL 25 MG PO TABS: 25.0000 mg | ORAL_TABLET | Freq: Three times a day (TID) | ORAL | Status: DC | PRN

## 2020-07-16 MED ORDER — GABAPENTIN 800 MG PO TABS
800.0000 mg | ORAL_TABLET | Freq: Three times a day (TID) | ORAL | Status: DC
Start: 2020-07-16 — End: 2020-07-18
  Administered 2020-07-16 – 2020-07-18 (×5): 800 mg via ORAL
  Filled 2020-07-16 (×9): qty 1

## 2020-07-16 NOTE — Progress Notes (Signed)
Recreation Therapy Notes  Date: 6.29.22 Time: 0930 Location: 300 Hall Dayroom  Group Topic: Stress Management   Goal Area(s) Addresses:  Patient will actively participate in stress management techniques presented during session.  Patient will successfully identify benefit of practicing stress management post d/c.   Intervention: Guided exercise with ambient sound and script  Activity :Guided Imagery  LRT explained to patients the technique of guided imagery and what it entailed.  Patients were asked to participate in the technique introduced during session. LRT gave suggestions of ways to access scripts post d/c and encouraged patients to explore Youtube and other apps available on smartphones, tablets, and computers.   Education:  Stress Management, Discharge Planning.   Education Outcome: Acknowledges education  Clinical Observations/Feedback: Patient did not participate in group session.    Erik Nessel, LRT/CTRS         Larita Deremer A 07/16/2020 11:28 AM 

## 2020-07-16 NOTE — Progress Notes (Signed)
[facility] MD Progress Note  07/16/2020 3:47 PM Rachel Nolan  MRN:  [id number]  Subjective: Rachel Nolan reports, Rachel Nolan reports, "I'm doing so so. My legs are bothering me. I have not been able to go to any group sessions because I don't feel like it right now. I came to the hospital because my mood got worse after I ran out of my medicines for about one month. I have re-started my medicines. Just waiting for it to kick-in. I'm using the walker for support when I walk because my legs hurt".  Objective: Patient is a 50 year old female with a past psychiatric history significant for depression, cocaine induced mood disorder, polysubstance abuse who was admitted on 07/14/2020 with chronic suicidal ideation.  (Per previous notes, no changes) Objective: Patient is seen and examined.  Patient is a 50 year old female with the above-stated past psychiatric history who is seen in follow-up.  She has been followed by Dr. Mason Jim until today, and she presents today stating that she is in pain, and that she is nauseated, and she is vomiting.  She apparently prior to admission had been out of her medications.  She had recently been admitted to the [facility] in May of this year.  Diagnosis at that time was major depression, PTSD, cocaine use disorder, substance-induced mood disorder, alcohol use disorder and opioid use disorder.  Her discharge medications at that time included amlodipine, fluoxetine, gabapentin, prazosin, Seroquel and trazodone.  She had been off her medications for somewhere between 2 to 3 weeks.  She is currently on the Norvasc, Prozac, Neurontin, Minipress and Seroquel at their previous dosages.  She stated she has been suicidal "forever".  She cited being present when her brother was shot and killed in October 2021.  She does admit to suicidal ideation prior to that.  She has been followed by a ACTT service.  She apparently has been living in an abandoned building, but now the building  is being renovated and she can no longer stay there.  Her vital signs are stable, she is afebrile.  She slept 6.75 hours last night.  Laboratories on admission showed normal creatinine and normal liver function enzymes.  The rest of her electrolytes were normal.  Lipid panel was normal.  CBC was essentially normal.  Differential was normal.  Acetaminophen was less than 10, salicylate less than 7.  Hemoglobin A1c was 5.5.  Beta-hCG was negative.  TSH was normal at 0.808.  Influenza panel, coronavirus were negative.  Urinalysis was negative.  Drug screen was positive for cocaine and marijuana.  EKG showed a normal sinus rhythm with a QTc interval of 471.  Principal Problem: MDD (major depressive disorder), recurrent, severe, with psychosis (HCC)  Diagnosis: Principal Problem:   MDD (major depressive disorder), recurrent, severe, with psychosis (HCC) Active Problems:   Cocaine-induced mood disorder (HCC)   Polysubstance abuse (HCC)  Total Time spent with patient: 20 minutes  Past Psychiatric History: See admission H&P  Past Medical History:  Past Medical History:  Diagnosis Date   Anxiety    Asthma    Depression    Hypertension    Scoliosis     Past Surgical History:  Procedure Laterality Date   BACK SURGERY     ECTOPIC PREGNANCY SURGERY     Family History:  Family History  Problem Relation Age of Onset   Hypertension Mother    Hypertension Father    Family Psychiatric  History: See admission H&P Social History:  Social History   Substance  and Sexual Activity  Alcohol Use Yes   Comment: one 40 oz per month     Social History   Substance and Sexual Activity  Drug Use Yes   Types: Marijuana, Cocaine, "Crack" cocaine    Social History   Socioeconomic History   Marital status: Widowed    Spouse name: Not on file   Number of children: Not on file   Years of education: Not on file   Highest education level: Not on file  Occupational History   Not on file  Tobacco Use    Smoking status: Every Day    Packs/day: 1.00    Pack years: 0.00    Types: Cigarettes   Smokeless tobacco: Never  Vaping Use   Vaping Use: Never used  Substance and Sexual Activity   Alcohol use: Yes    Comment: one 40 oz per month   Drug use: Yes    Types: Marijuana, Cocaine, "Crack" cocaine   Sexual activity: Not Currently  Other Topics Concern   Not on file  Social History Narrative   Pt is homeless, no fixed address; not followed by an outpatient psychiatrist   Social Determinants of Health   Financial Resource Strain: Not on file  Food Insecurity: Not on file  Transportation Needs: Not on file  Physical Activity: Not on file  Stress: Not on file  Social Connections: Not on file   Additional Social History:   Sleep: Good  Appetite:  Good  Current Medications: Current Facility-Administered Medications  Medication Dose Route Frequency Provider Last Rate Last Admin   acetaminophen (TYLENOL) tablet 650 mg  650 mg Oral Q6H PRN Bobbitt, Shalon E, NP   650 mg at 07/13/20 2130   alum & mag hydroxide-simeth (MAALOX/MYLANTA) 200-200-20 MG/5ML suspension 30 mL  30 mL Oral Q4H PRN Bobbitt, Shalon E, NP       amLODipine (NORVASC) tablet 10 mg  10 mg Oral Daily Laveda Abbe, NP   10 mg at 07/16/20 0912   FLUoxetine (PROZAC) capsule 40 mg  40 mg Oral Daily Laveda Abbe, NP   40 mg at 07/16/20 0912   gabapentin (NEURONTIN) tablet 800 mg  800 mg Oral TID Antonieta Pert, MD       hydrOXYzine (ATARAX/VISTARIL) tablet 25 mg  25 mg Oral TID PRN Ajibola, Ene A, NP       ibuprofen (ADVIL) tablet 400 mg  400 mg Oral Q6H PRN Laveda Abbe, NP   400 mg at 07/15/20 2129   magnesium hydroxide (MILK OF MAGNESIA) suspension 30 mL  30 mL Oral Daily PRN Bobbitt, Shalon E, NP       prazosin (MINIPRESS) capsule 3 mg  3 mg Oral QHS Laveda Abbe, NP   3 mg at 07/15/20 2129   QUEtiapine (SEROQUEL) tablet 600 mg  600 mg Oral QHS Antonieta Pert, MD        traZODone (DESYREL) tablet 100 mg  100 mg Oral QHS Antonieta Pert, MD       Lab Results:  No results found for this or any previous visit (from the past 48 hour(s)).  Blood Alcohol level:  Lab Results  Component Value Date   [facility] <10 07/11/2020   ETH <10 07/09/2020   Metabolic Disorder Labs: Lab Results  Component Value Date   HGBA1C 5.5 07/09/2020   MPG 111.15 07/09/2020   MPG 111.15 01/24/2020   No results found for: PROLACTIN Lab Results  Component Value Date   CHOL  143 07/09/2020   TRIG 88 07/09/2020   HDL 65 07/09/2020   CHOLHDL 2.2 07/09/2020   VLDL 18 07/09/2020   LDLCALC 60 07/09/2020   LDLCALC 64 05/17/2020   Physical Findings: AIMS: Facial and Oral Movements Muscles of Facial Expression: None, normal Lips and Perioral Area: None, normal Jaw: None, normal Tongue: None, normal,Extremity Movements Upper (arms, wrists, hands, fingers): None, normal Lower (legs, knees, ankles, toes): None, normal, Trunk Movements Neck, shoulders, hips: None, normal, Overall Severity Severity of abnormal movements (highest score from questions above): None, normal Incapacitation due to abnormal movements: None, normal Patient's awareness of abnormal movements (rate only patient's report): No Awareness, Dental Status Current problems with teeth and/or dentures?: No Does patient usually wear dentures?: No  CIWA:    COWS:     Musculoskeletal: Strength & Muscle Tone: within normal limits Gait & Station: normal Patient leans: N/A  Psychiatric Specialty Exam:  Presentation  General Appearance: Disheveled  Eye Contact:Fair  Speech:Normal Rate  Speech Volume:Normal  Handedness:Right  Mood and Affect  Mood:Dysphoric  Affect:Congruent  Thought Process  Thought Processes:Coherent; Goal Directed  Descriptions of Associations:Circumstantial  Orientation:Full (Time, Place and Person)  Thought Content:Logical  History of Schizophrenia/Schizoaffective  disorder:No  Duration of Psychotic Symptoms:Greater than six months  Hallucinations:Hallucinations: None Ideas of Reference:None  Suicidal Thoughts:Suicidal Thoughts: Yes, Passive SI Active Intent and/or Plan: Without Intent Homicidal Thoughts:Homicidal Thoughts: No  Sensorium  Memory:Immediate Fair; Recent Fair; Remote Fair  Judgment:Poor  Insight:Lacking  Executive Functions  Concentration:Fair  Attention Span:Fair  Recall:Fair  Fund of Knowledge:Fair  Language:Fair  Psychomotor Activity  Psychomotor Activity: Psychomotor Activity: Increased  Assets  Assets:Desire for Improvement; Resilience  Sleep  Sleep: Number of Hours of Sleep: 6.5  Physical Exam: Physical Exam Vitals and nursing note reviewed.  HENT:     Head: Normocephalic and atraumatic.     Nose: Nose normal.     Mouth/Throat:     Pharynx: Oropharynx is clear.  Eyes:     Pupils: Pupils are equal, round, and reactive to light.  Cardiovascular:     Rate and Rhythm: Normal rate.  Pulmonary:     Effort: Pulmonary effort is normal.  Genitourinary:    Comments: Deferred Musculoskeletal:        General: Normal range of motion.     Cervical back: Normal range of motion.     Comments: Walks with a walker due to lower extremity weakness.  Skin:    General: Skin is warm and dry.  Neurological:     General: No focal deficit present.     Mental Status: She is alert and oriented to person, place, and time.   Review of Systems  Constitutional: Negative.   HENT: Negative.    Eyes: Negative.   Respiratory: Negative.    Cardiovascular: Negative.   Gastrointestinal:  Positive for diarrhea, nausea and vomiting.  Skin: Negative.   Neurological:  Positive for weakness.  Endo/Heme/Allergies:        Allergies: NKDA  Psychiatric/Behavioral:  The patient is nervous/anxious.   All other systems reviewed and are negative. Blood pressure 116/80, pulse (!) 120, temperature 98.6 F (37 C), temperature source  Oral, resp. rate 18, height 5\' 6"  (1.676 m), weight 95.3 kg, last menstrual period 06/19/2018, SpO2 97 %. Body mass index is 33.89 kg/m.  Treatment Plan Summary: Daily contact with patient to assess and evaluate symptoms and progress in treatment, Medication management, and Plan patient is seen and examined.  Patient is a 50 year old female with the above-stated  past psychiatric history who is seen in follow-up.  Continue inpatient hospitalization.  Will continue today 07/16/2020 plan as below except where it is noted.   Diagnosis: 1.  Posttraumatic stress disorder 2.  Unspecified depression. 3.  Polysubstance use disorders. 4.  Hypertension. 5.  Nausea  Pertinent findings on examination today: 1.  New onset nausea.  Her roommate also reported nausea, so there it is unclear what the origin of this is.  It might be withdrawal syndromes, but her blood pressure remained stable.  We will have to monitor this. 2.  Chronic suicidal ideation. 3.  Decreased mood.  Plan: 1.  Continue amlodipine 10 mg p.o. daily for hypertension. 2.  Continue fluoxetine 40 mg p.o. daily for anxiety and depression. 3.  Continue gabapentin 600 mg p.o. 3 times daily for chronic pain, mood stability and anxiety. 4.  Continue Zofran 4 mg p.o. every 8 hours as needed nausea. 5.  Continue prazosin 5 mg p.o. nightly for nightmares and flashbacks. 6.  Continue Seroquel 600 mg p.o. nightly for mood stability and insomnia. 7.  Continue trazodone 150 mg p.o. nightly for insomnia. 8.  Disposition planning-in progress.  Armandina Stammer, NP, pmhnp, fnp-bc 07/16/2020, 3:47 PM

## 2020-07-16 NOTE — Progress Notes (Addendum)
MHT reported to this writer that pt is up and says she vomited in the sink in the bathroom. Pt sitting on side of bed. Pt showed this Clinical research associate where she vomited portions of her dinner. Whole kernels of corn visible in the vomitus. "I had corn for dinner. It's the food."  Pt given water to drink. Nighttime provider notified to see if an order for Zofran can be given.

## 2020-07-16 NOTE — Progress Notes (Signed)
   07/16/20 2115  Psych Admission Type (Psych Patients Only)  Admission Status Voluntary  Psychosocial Assessment  Patient Complaints Anxiety;Irritability  Eye Contact Avertive;Brief  Facial Expression Pained;Pensive;Sad  Affect Appropriate to circumstance  Speech Logical/coherent  Interaction Assertive  Motor Activity Unsteady;Slow  Appearance/Hygiene Poor hygiene;Disheveled  Behavior Characteristics Cooperative;Appropriate to situation  Mood Depressed;Sad  Thought Process  Coherency WDL  Content WDL  Delusions None reported or observed  Perception WDL  Hallucination None reported or observed  Judgment WDL  Confusion None  Danger to Self  Current suicidal ideation? Passive  Self-Injurious Behavior No self-injurious ideation or behavior indicators observed or expressed   Agreement Not to Harm Self Yes  Description of Agreement verbal contract  Danger to Others  Danger to Others None reported or observed

## 2020-07-16 NOTE — [facility] Counselor (Signed)
CSW provided the patient with a packet of resources that includes: housing and shelter resources, free and reduced price food resources, information for the [employer], [employer] Cards, and suicide prevention information.  CSW will follow up with the patient to see if any of these resources were helpful.  

## 2020-07-16 NOTE — [facility] Group Notes (Signed)
Type of Therapy: Group Therapy Type of Therapy:  Feelings   Participation Level:  Did Not Attend  Modes of Intervention: Clarification, Confrontation, Discussion, Education, Exploration, Limit-setting, Orientation, Problem-solving, Rapport Building, Dance movement psychotherapist, Socialization and Support  Summary of Progress/Problems: The topic for group today was emotional regulation. This group focused on both positive and negative emotion identification and allowed group members to process ways to identify feelings, regulate negative emotions, and find healthy ways to manage internal/external emotions. Group members were asked to reflect on a time when their reaction to an emotion led to a negative outcome and explored how alternative responses using emotion regulation would have benefited them. Group members were also asked to discuss a time when emotion regulation was utilized when a negative emotion was experienced.

## 2020-07-17 NOTE — Progress Notes (Signed)
[facility] MD Progress Note  07/17/2020 2:08 PM Rachel Nolan  MRN:  [id number]  Subjective: Rachel Nolan reports, "I'm doing better now that I have re-started my medicines. I feel good. I'm wondering if I can be discharged tomorrow morning. My daughter will be coming to pick me up tomorrow. My son is scheduled to have back surgery on Monday, July 4th".  Objective: Patient is a 50 year old female with a past psychiatric history significant for depression, cocaine induced mood disorder, polysubstance abuse who was admitted on 07/14/2020 with chronic suicidal ideation.  Objective: Rachel Nolan is seen, chart reviewed. The chart findings discussed with the treatment team. She is lying down in bed. She says she was up for breakfast this morning. Says she is doing much better now that she has re-started her medications. She denies any side effects. She says her sleep has improved, mood improved. Denies any symptoms of depression or anxiety. Denies any other issues or concerns. Has asked to be discharged tomorrow morning as she feels ready to be discharged. Says her daughter will be picking her up tomorrow. Says son is scheduled for back surgery on Monday, July 4th. She currently denies any SIHI, AVH, delusional thoughts or paranoia. She does not appear to be responding to any internal stimuli. No changes made on her current plan of care.  Principal Problem: MDD (major depressive disorder), recurrent, severe, with psychosis (HCC)  Diagnosis: Principal Problem:   MDD (major depressive disorder), recurrent, severe, with psychosis (HCC) Active Problems:   Cocaine-induced mood disorder (HCC)   Polysubstance abuse (HCC)  Total Time spent with patient: 15 minutes  Past Psychiatric History: See admission H&P  Past Medical History:  Past Medical History:  Diagnosis Date   Anxiety    Asthma    Depression    Hypertension    Scoliosis     Past Surgical History:  Procedure Laterality Date   BACK SURGERY     ECTOPIC  PREGNANCY SURGERY     Family History:  Family History  Problem Relation Age of Onset   Hypertension Mother    Hypertension Father    Family Psychiatric  History: See admission H&P Social History:  Social History   Substance and Sexual Activity  Alcohol Use Yes   Comment: one 40 oz per month     Social History   Substance and Sexual Activity  Drug Use Yes   Types: Marijuana, Cocaine, "Crack" cocaine    Social History   Socioeconomic History   Marital status: Widowed    Spouse name: Not on file   Number of children: Not on file   Years of education: Not on file   Highest education level: Not on file  Occupational History   Not on file  Tobacco Use   Smoking status: Every Day    Packs/day: 1.00    Pack years: 0.00    Types: Cigarettes   Smokeless tobacco: Never  Vaping Use   Vaping Use: Never used  Substance and Sexual Activity   Alcohol use: Yes    Comment: one 40 oz per month   Drug use: Yes    Types: Marijuana, Cocaine, "Crack" cocaine   Sexual activity: Not Currently  Other Topics Concern   Not on file  Social History Narrative   Pt is homeless, no fixed address; not followed by an outpatient psychiatrist   Social Determinants of Health   Financial Resource Strain: Not on file  Food Insecurity: Not on file  Transportation Needs: Not on file  Physical Activity:  Not on file  Stress: Not on file  Social Connections: Not on file   Additional Social History:   Sleep: Good  Appetite:  Good  Current Medications: Current Facility-Administered Medications  Medication Dose Route Frequency Provider Last Rate Last Admin   acetaminophen (TYLENOL) tablet 650 mg  650 mg Oral Q6H PRN Bobbitt, Shalon E, NP   650 mg at 07/13/20 2130   alum & mag hydroxide-simeth (MAALOX/MYLANTA) 200-200-20 MG/5ML suspension 30 mL  30 mL Oral Q4H PRN Bobbitt, Shalon E, NP       amLODipine (NORVASC) tablet 10 mg  10 mg Oral Daily Laveda AbbeParks, Laurie Britton, NP   10 mg at 07/17/20 0900    FLUoxetine (PROZAC) capsule 40 mg  40 mg Oral Daily Laveda AbbeParks, Laurie Britton, NP   40 mg at 07/17/20 0900   gabapentin (NEURONTIN) tablet 800 mg  800 mg Oral TID Antonieta Pertlary, Greg Lawson, MD   800 mg at 07/17/20 1300   hydrOXYzine (ATARAX/VISTARIL) tablet 25 mg  25 mg Oral TID PRN Ajibola, Ene A, NP       ibuprofen (ADVIL) tablet 400 mg  400 mg Oral Q6H PRN Laveda AbbeParks, Laurie Britton, NP   400 mg at 07/15/20 2129   magnesium hydroxide (MILK OF MAGNESIA) suspension 30 mL  30 mL Oral Daily PRN Bobbitt, Shalon E, NP       prazosin (MINIPRESS) capsule 3 mg  3 mg Oral QHS Laveda AbbeParks, Laurie Britton, NP   3 mg at 07/16/20 2115   QUEtiapine (SEROQUEL) tablet 600 mg  600 mg Oral QHS Antonieta Pertlary, Greg Lawson, MD   600 mg at 07/16/20 2115   traZODone (DESYREL) tablet 100 mg  100 mg Oral QHS Antonieta Pertlary, Greg Lawson, MD   100 mg at 07/16/20 2115   Lab Results:  No results found for this or any previous visit (from the past 48 hour(s)).  Blood Alcohol level:  Lab Results  Component Value Date   ETH <10 07/11/2020   ETH <10 07/09/2020   Metabolic Disorder Labs: Lab Results  Component Value Date   HGBA1C 5.5 07/09/2020   MPG 111.15 07/09/2020   MPG 111.15 01/24/2020   No results found for: PROLACTIN Lab Results  Component Value Date   CHOL 143 07/09/2020   TRIG 88 07/09/2020   HDL 65 07/09/2020   CHOLHDL 2.2 07/09/2020   VLDL 18 07/09/2020   LDLCALC 60 07/09/2020   LDLCALC 64 05/17/2020   Physical Findings: AIMS: Facial and Oral Movements Muscles of Facial Expression: None, normal Lips and Perioral Area: None, normal Jaw: None, normal Tongue: None, normal,Extremity Movements Upper (arms, wrists, hands, fingers): None, normal Lower (legs, knees, ankles, toes): None, normal, Trunk Movements Neck, shoulders, hips: None, normal, Overall Severity Severity of abnormal movements (highest score from questions above): None, normal Incapacitation due to abnormal movements: None, normal Patient's awareness of abnormal  movements (rate only patient's report): No Awareness, Dental Status Current problems with teeth and/or dentures?: No Does patient usually wear dentures?: No  CIWA:    COWS:     Musculoskeletal: Strength & Muscle Tone: within normal limits Gait & Station: normal Patient leans: N/A  Psychiatric Specialty Exam:  Presentation  General Appearance: Appropriate for Environment; Casual  Eye Contact:Good  Speech:Clear and Coherent; Normal Rate  Speech Volume:Normal  Handedness:Right  Mood and Affect  Mood:Euthymic  Affect:Appropriate; Congruent  Thought Process  Thought Processes:Coherent; Goal Directed; Linear  Descriptions of Associations:Intact  Orientation:Full (Time, Place and Person)  Thought Content:Logical  History of Schizophrenia/Schizoaffective disorder:No  Duration of Psychotic Symptoms:N/A  Hallucinations:Hallucinations: None Description of Auditory Hallucinations: NA Description of Visual Hallucinations: NA Ideas of Reference:None  Suicidal Thoughts:Suicidal Thoughts: No SI Active Intent and/or Plan: Without Intent; Without Plan; Without Means to Carry Out; Without Access to Means SI Passive Intent and/or Plan: Without Intent; Without Plan; Without Means to Carry Out Homicidal Thoughts:Homicidal Thoughts: No  Sensorium  Memory:Recent Good; Remote Good; Immediate Good  Judgment:Good  Insight:Good  Executive Functions  Concentration:Good  Attention Span:Good  Recall:Good  Fund of Knowledge:Good  Language:Good  Psychomotor Activity  Psychomotor Activity: Psychomotor Activity: Normal  Assets  Assets:Communication Skills; Desire for Improvement; Social Support  Sleep  Sleep: Sleep: Good Number of Hours of Sleep: 6.75  Physical Exam: Physical Exam Vitals and nursing note reviewed.  HENT:     Head: Normocephalic and atraumatic.     Nose: Nose normal.     Mouth/Throat:     Pharynx: Oropharynx is clear.  Eyes:     Pupils: Pupils  are equal, round, and reactive to light.  Cardiovascular:     Rate and Rhythm: Normal rate.  Pulmonary:     Effort: Pulmonary effort is normal.  Genitourinary:    Comments: Deferred Musculoskeletal:        General: Normal range of motion.     Cervical back: Normal range of motion.     Comments: Walks with a walker due to lower extremity weakness.  Skin:    General: Skin is warm and dry.  Neurological:     General: No focal deficit present.     Mental Status: She is alert and oriented to person, place, and time.   Review of Systems  Constitutional: Negative.   HENT: Negative.    Eyes: Negative.   Respiratory: Negative.    Cardiovascular: Negative.   Gastrointestinal:  Positive for diarrhea, nausea and vomiting.  Skin: Negative.   Neurological:  Positive for weakness.  Endo/Heme/Allergies:        Allergies: NKDA  Psychiatric/Behavioral:  The patient is nervous/anxious.   All other systems reviewed and are negative. Blood pressure 104/83, pulse (!) 104, temperature 98 F (36.7 C), temperature source Oral, resp. rate 18, height 5\' 6"  (1.676 m), weight 95.3 kg, last menstrual period 06/19/2018, SpO2 (!) 87 %. Body mass index is 33.89 kg/m.  Treatment Plan Summary: Daily contact with patient to assess and evaluate symptoms and progress in treatment, Medication management, and Plan patient is seen and examined.  Patient is a 50 year old female with the above-stated past psychiatric history who is seen in follow-up.  Continue inpatient hospitalization.  Will continue today 07/17/2020 plan as below except where it is noted.   Diagnosis: 1.  Posttraumatic stress disorder 2.  Unspecified depression. 3.  Polysubstance use disorders. 4.  Hypertension. 5.  Nausea  Pertinent findings on examination today: 1.  New onset nausea.  Her roommate also reported nausea, so there it is unclear what the origin of this is.  It might be withdrawal syndromes, but her blood pressure remained  stable.  We will have to monitor this. 2.  Chronic suicidal ideation. 3.  Decreased mood.  Plan: 1.  Continue amlodipine 10 mg p.o. daily for hypertension. 2.  Continue fluoxetine 40 mg p.o. daily for anxiety and depression. 3.  Continue gabapentin 600 mg p.o. 3 times daily for chronic pain, mood stability and anxiety. 4.  Continue Zofran 4 mg p.o. every 8 hours as needed nausea. 5.  Continue prazosin 5 mg p.o. nightly for nightmares and flashbacks. 6.  Continue Seroquel 600 mg p.o. nightly for mood stability and insomnia. 7.  Continue trazodone 150 mg p.o. nightly for insomnia. 8.  Disposition planning-in progress.  Armandina Stammer, NP, pmhnp, fnp-bc 07/17/2020, 2:08 PM

## 2020-07-17 NOTE — Progress Notes (Signed)
Patient expressed she would like to be discharged on Friday 7/1 because her son is having surgery and she would like to be there for him. Patient explained that her daughter would be coming to pick her up.

## 2020-07-17 NOTE — Progress Notes (Signed)
   07/17/20 2057  Psych Admission Type (Psych Patients Only)  Admission Status Voluntary  Psychosocial Assessment  Patient Complaints Anxiety;Worrying  Eye Contact Avertive;Brief  Facial Expression Anxious  Affect Appropriate to circumstance  Speech Logical/coherent  Interaction Assertive  Motor Activity Slow;Unsteady  Appearance/Hygiene Poor hygiene;Disheveled  Behavior Characteristics Cooperative;Appropriate to situation  Mood Depressed  Thought Process  Coherency WDL  Content WDL  Delusions None reported or observed  Perception WDL  Hallucination None reported or observed  Judgment WDL  Confusion None  Danger to Self  Current suicidal ideation? Denies  Self-Injurious Behavior No self-injurious ideation or behavior indicators observed or expressed   Agreement Not to Harm Self Yes  Description of Agreement verbal contract  Danger to Others  Danger to Others None reported or observed

## 2020-07-17 NOTE — Progress Notes (Signed)
Adult Psychoeducational Group Note  Date:  07/17/2020 Time:  11:39 PM  Group Topic/Focus:  Wrap-Up Group:   The focus of this group is to help patients review their daily goal of treatment and discuss progress on daily workbooks.  Participation Level:  Active  Participation Quality:  Appropriate and Supportive  Affect:  Appropriate  Cognitive:  Alert and Appropriate  Insight: Appropriate  Engagement in Group:  Supportive  Modes of Intervention:  Discussion  Additional Comments:   Pt articulated that her goal for today was to focus on her treatment plan, and this goal was accomplished. Pt stated she did talk with staff about her care. Pt conveyed she made a phone call to her mother and reported relationship with her family and support system was improving. Pt reported she took all medications and attended all meal services with 100% intake. Pt said her appetite was good today. Pt evaluated her sleep last night as good. Pt verbalized she felt good about herself and rated her overall day an 8 out of 10 on this date. Pt articulated she had no physical pain. Pt denies no auditory or visual hallucinations or thoughts of harming herself or others. Pt said she would alert staff if anything changed. End of Wrap-Up Group progress report.     Nicoletta Dress 07/17/2020, 11:39 PM

## 2020-07-17 NOTE — Progress Notes (Signed)
Patient denied SI and HI, contracts for safety.  Denied A/V hallucinations.  Denied pain. Medications administered per MD orders.  Emotional support and encouragement given patient. Safety maintained with 15 minute checks.   

## 2020-07-17 NOTE — Progress Notes (Signed)
Adult Psychoeducational Group Note  Date:  07/17/2020 Time:  3:48 PM  Group Topic/Focus:  Goals Group:   The focus of this group is to help patients establish daily goals to achieve during treatment and discuss how the patient can incorporate goal setting into their daily lives to aide in recovery.  Participation Level:  Did Not Attend Rachel Nolan 07/17/2020, 3:48 PM

## 2020-07-17 NOTE — Plan of Care (Signed)
Nurse discussed coping skills with patient.  

## 2020-07-18 ENCOUNTER — Other Ambulatory Visit: Payer: Self-pay

## 2020-07-18 MED ORDER — QUETIAPINE FUMARATE 300 MG PO TABS
600.0000 mg | ORAL_TABLET | Freq: Every day | ORAL | 0 refills | Status: DC
  Filled 2020-07-18 – 2020-08-20 (×2): qty 60, 30d supply, fill #0

## 2020-07-18 MED ORDER — AMLODIPINE BESYLATE 10 MG PO TABS
10.0000 mg | ORAL_TABLET | Freq: Every day | ORAL | 0 refills | Status: DC
Start: 2020-07-18 — End: 2021-02-26
  Filled 2020-07-18 – 2020-08-20 (×2): qty 30, 30d supply, fill #0

## 2020-07-18 MED ORDER — TRAZODONE HCL 100 MG PO TABS
100.0000 mg | ORAL_TABLET | Freq: Every day | ORAL | 0 refills | Status: DC
  Filled 2020-07-18 – 2020-08-20 (×2): qty 15, 15d supply, fill #0

## 2020-07-18 MED ORDER — FLUOXETINE HCL 40 MG PO CAPS
40.0000 mg | ORAL_CAPSULE | Freq: Every day | ORAL | 0 refills | Status: DC
  Filled 2020-07-18 – 2020-08-20 (×2): qty 30, 30d supply, fill #0

## 2020-07-18 MED ORDER — GABAPENTIN 800 MG PO TABS
800.0000 mg | ORAL_TABLET | Freq: Three times a day (TID) | ORAL | 0 refills | Status: DC
  Filled 2020-07-18 – 2020-08-20 (×2): qty 90, 30d supply, fill #0

## 2020-07-18 MED ORDER — QUETIAPINE FUMARATE 200 MG PO TABS
400.0000 mg | ORAL_TABLET | Freq: Every day | ORAL | 0 refills | Status: DC
  Filled 2020-07-18: qty 30, 15d supply, fill #0

## 2020-07-18 MED ORDER — PRAZOSIN HCL 1 MG PO CAPS
3.0000 mg | ORAL_CAPSULE | Freq: Every day | ORAL | 0 refills | Status: DC
  Filled 2020-07-18 – 2020-08-20 (×2): qty 30, 10d supply, fill #0

## 2020-07-18 NOTE — [facility] Suicide Risk Assessment (Signed)
[facility] Discharge Suicide Risk Assessment   Principal Problem: MDD (major depressive disorder), recurrent, severe, with psychosis (HCC) Discharge Diagnoses: Principal Problem:   MDD (major depressive disorder), recurrent, severe, with psychosis (HCC) Active Problems:   Cocaine-induced mood disorder (HCC)   Polysubstance abuse (HCC)   Total Time spent with patient: 20 minutes  Musculoskeletal: Strength & Muscle Tone: within normal limits Gait & Station: normal Patient leans: N/A  Psychiatric Specialty Exam  Presentation  General Appearance: Appropriate for Environment; Casual  Eye Contact:Good  Speech:Clear and Coherent; Normal Rate  Speech Volume:Normal  Handedness:Right   Mood and Affect  Mood:Euthymic  Duration of Depression Symptoms: Greater than two weeks  Affect:Appropriate; Congruent   Thought Process  Thought Processes:Coherent; Goal Directed; Linear  Descriptions of Associations:Intact  Orientation:Full (Time, Place and Person)  Thought Content:Logical  History of Schizophrenia/Schizoaffective disorder:No  Duration of Psychotic Symptoms:N/A  Hallucinations:Hallucinations: None Description of Auditory Hallucinations: NA Description of Visual Hallucinations: NA  Ideas of Reference:None  Suicidal Thoughts:Suicidal Thoughts: No SI Active Intent and/or Plan: Without Intent; Without Plan; Without Means to Carry Out; Without Access to Means SI Passive Intent and/or Plan: Without Intent; Without Plan; Without Means to Carry Out  Homicidal Thoughts:Homicidal Thoughts: No   Sensorium  Memory:Recent Good; Remote Good; Immediate Good  Judgment:Good  Insight:Good   Executive Functions  Concentration:Good  Attention Span:Good  Recall:Good  Fund of Knowledge:Good  Language:Good   Psychomotor Activity  Psychomotor Activity:Psychomotor Activity: Normal   Assets  Assets:Communication Skills; Desire for Improvement; Social Support   Sleep   Sleep:Sleep: Good Number of Hours of Sleep: 6.75   Physical Exam: Physical Exam Vitals and nursing note reviewed.  HENT:     Head: Normocephalic and atraumatic.  Pulmonary:     Effort: Pulmonary effort is normal.  Neurological:     General: No focal deficit present.     Mental Status: She is alert and oriented to person, place, and time.   Review of Systems  All other systems reviewed and are negative. Blood pressure 120/82, pulse 100, temperature 98 F (36.7 C), temperature source Oral, resp. rate 18, height 5\' 6"  (1.676 m), weight 95.3 kg, last menstrual period 06/19/2018, SpO2 (!) 87 %. Body mass index is 33.89 kg/m.  Mental Status Per Nursing Assessment::   On Admission:  Suicidal ideation indicated by patient  Demographic Factors:  Divorced or widowed, Low socioeconomic status, Living alone, and Unemployed  Loss Factors: Decline in physical health and Financial problems/change in socioeconomic status  Historical Factors: Impulsivity  Risk Reduction Factors:   Positive therapeutic relationship  Continued Clinical Symptoms:  Depression:   Comorbid alcohol abuse/dependence Impulsivity Alcohol/Substance Abuse/Dependencies  Cognitive Features That Contribute To Risk:  None    Suicide Risk:  Minimal: No identifiable suicidal ideation.  Patients presenting with no risk factors but with morbid ruminations; may be classified as minimal risk based on the severity of the depressive symptoms   Follow-up Information     [facility] Follow up.   Why: Please continue to follow up with this facility with ACTT services. Contact information: [address] [place] [phone]                 Plan Of Care/Follow-up recommendations:  Activity:  ad lib  [phone], MD 07/18/2020, 7:49 AM

## 2020-07-18 NOTE — Progress Notes (Signed)
  [facility] Adult Case Management Discharge Plan :  Will you be returning to the same living situation after discharge:  No. [place] with family At discharge, do you have transportation home?: Yes,  Daughter Do you have the ability to pay for your medications: Yes,  ACTT  Release of information consent forms completed and in the chart;  Patient's signature needed at discharge.  Patient to Follow up at:  Follow-up Information     [facility] Follow up.   Why: Please continue to follow up with this facility with ACTT services. Contact information: [address] [place] [address] [place] [address] [phone]                 Next level of care provider has access to [facility] Link:no  Safety Planning and Suicide Prevention discussed: Yes,  with patient   Have you used any form of tobacco in the last 30 days? (Cigarettes, Smokeless Tobacco, Cigars, and/or Pipes): Yes  Has patient been referred to the Quitline?: Patient refused referral  Patient has been referred for addiction treatment: N/A  Aram Beecham, LCSWA 07/18/2020, 9:16 AM

## 2020-07-18 NOTE — Discharge Summary (Addendum)
Physician Discharge Summary Note  Patient:  Rachel Nolan is an 50 y.o., female  MRN:  [id number]  DOB:  05-03-1970  Patient phone:  [phone] (home)   Patient address:   [address] [place] [address],   Total Time spent with patient: Greater than 30 minutes.  Date of Admission:  07/13/2020  Date of Discharge: 07/18/2020  Reason for Admission: Worsening suicidal ideations with plans to shoot herself.   Principal Problem: MDD (major depressive disorder), recurrent, severe, with psychosis ([place])  Discharge Diagnoses: Principal Problem:   MDD (major depressive disorder), recurrent, severe, with psychosis ([place]) Active Problems:   Cocaine-induced mood disorder ([place])   Polysubstance abuse ([place])  Past Psychiatric History: Six previous hospitalizations. History of MDD, PTSD, cocaine use disorder, and benzodiazepine use disorder. Denies ever participating in outpatient treatment or therapy for mental health or substance abuse. Two prior suicide attempts via overdose. Prior history of hospitilization for benzodiazepine withdrawals  Past Medical History:  Past Medical History:  Diagnosis Date   Anxiety    Asthma    Depression    Hypertension    Scoliosis     Past Surgical History:  Procedure Laterality Date   BACK SURGERY     ECTOPIC PREGNANCY SURGERY     Family History:  Family History  Problem Relation Age of Onset   Hypertension Mother    Hypertension Father    Family Psychiatric  History: Father and sister with substance use disorders  Social History:  Social History   Substance and Sexual Activity  Alcohol Use Yes   Comment: one 40 oz per month     Social History   Substance and Sexual Activity  Drug Use Yes   Types: Marijuana, Cocaine, "Crack" cocaine    Social History   Socioeconomic History   Marital status: Widowed    Spouse name: Not on file   Number of children: Not on file   Years of education: Not on file   Highest education level:  Not on file  Occupational History   Not on file  Tobacco Use   Smoking status: Every Day    Packs/day: 1.00    Pack years: 0.00    Types: Cigarettes   Smokeless tobacco: Never  Vaping Use   Vaping Use: Never used  Substance and Sexual Activity   Alcohol use: Yes    Comment: one 40 oz per month   Drug use: Yes    Types: Marijuana, Cocaine, "Crack" cocaine   Sexual activity: Not Currently  Other Topics Concern   Not on file  Social History Narrative   Pt is homeless, no fixed address; not followed by an outpatient psychiatrist   Social Determinants of Health   Financial Resource Strain: Not on file  Food Insecurity: Not on file  Transportation Needs: Not on file  Physical Activity: Not on file  Stress: Not on file  Social Connections: Not on file   Hospital Course: (Per Md's admission evaluation notes): The patient is a 50y/o female with a h/o polysubstance abuse, MDD, and PTSD who was admitted voluntarily for management of SI with plan to shoot herself. She states she has been out of her psychotropic medications for 3 or more weeks and started hearing voices of her deceased father and hearing command voices telling her to kill herself. She also describes hearing the sounds of gunshots. She reports VH of "seeing flashes" across her peripheral vision but denies paranoia. She would not cooperate for questions regarding ideas of reference or  first rank symptoms. She denies having access at this time to a weapon and states she has been living with "a friend" recently who has set a curfew for her of being in by 7pm nightly. She states she relapsed "one time" with $20 of crack cocaine but denies use of other illicit substance recently. She endorses worsening flashbacks related to past trauma but does not give details. She endorses feelings of hopelessness, worsening depression, poor sleep, low appetite, and poor focus in the context of her depression. See H&P for additional details.   Prior  to this discharge, Rosemarie Ax was seen & evaluated for mental health stability. The current laboratory findings were reviewed (stable), nurses notes & vital signs were reviewed as well. There are no current mental health or medical issues that should prevent this discharge at this time. Patient is being discharged to continue mental health care as noted below.   This is one of several psychiatric admissions/discharge summaries from the [facility] for this 50 year old AA female. She is known in this [facility] from her previous admissions/treatments for mood instability. She is with hx of chronic mental illness, substance use disorders & multiple psychiatric admissions. Her UDS result on this present admission was present for cocaine & THC. Kiona has been tried on multiple psychotropic medications for her symptoms & it appeared she has not been compliant with her recommended treatment regimen. She was brought to the hospital this time around for evaluation & treatment for the development of suicidal ideations with plans to shoot herself. She cited as her trigger, the fact that she ran out of her mental health medications for 3 weeks to a month.  After evaluation of her presenting symptoms, [place] was recommended for mood stabilization treatments. The medication regimen for her presenting symptoms were discussed & with her consent re-initiated. She received, stabilized & was discharged on the medications as listed below on her discharge medication lists. She was also enrolled & participated in the group counseling sessions being offered & held on this unit. She learned coping skills. She presented on this admission, other chronic medical conditions that required treatment & monitoring. She was resumed, treated & discharged on all her pertinent home medications for those pre-existing health issues. She tolerated her treatment regimen without any adverse effects or reactions reported. While a patient in this [facility]  this time, she used the walker to aid her ambulation. Blu's symptoms responded well to her treatment regimen warranting this discharge agreeable by her.   During the course of this present hospitalization, the 15-minute checks were adequate to ensure Kandie's safety.  Patient did not display any dangerous, violent or suicidal behavior on the unit. She interacted with patients & staff appropriately, participated appropriately in the group sessions/therapies. Her medications were addressed & adjusted to meet her needs. She was recommended for outpatient follow-up care & medication management upon discharge to assure her continuity of care.  At the time of discharge, patient is not reporting any acute suicidal/homicidal ideations. She feels more confident about her self-care & in managing her symptoms. She currently denies any new issues or concerns. Education and supportive counseling provided throughout her hospital stay & upon discharge.  Today upon her discharge evaluation with the attending psychiatrist, Catherin presents mentally & medically stable. She denies any other specific concerns. She is sleeping well. Her appetite is good. She denies other physical complaints. She denies AH/VH, delusional thoughts or paranoia. She does not appear to be responding to any internal stimuli.  She feels that her medications have been helpful & is in agreement to continue her current treatment regimen as recommended. She was able to engage in safety planning including plan to return to [facility] or contact emergency services if she feels unable to maintain her own safety or the safety of others. Pt had no further questions, comments, or concerns. She left [facility] with all personal belongings in no apparent distress. Transportation per her daughter.   Physical Findings: AIMS: Facial and Oral Movements Muscles of Facial Expression: None, normal Lips and Perioral Area: None, normal Jaw: None, normal Tongue: None,  normal,Extremity Movements Upper (arms, wrists, hands, fingers): None, normal Lower (legs, knees, ankles, toes): None, normal, Trunk Movements Neck, shoulders, hips: None, normal, Overall Severity Severity of abnormal movements (highest score from questions above): None, normal Incapacitation due to abnormal movements: None, normal Patient's awareness of abnormal movements (rate only patient's report): No Awareness, Dental Status Current problems with teeth and/or dentures?: No Does patient usually wear dentures?: No  CIWA:    COWS:     Musculoskeletal: Strength & Muscle Tone: within normal limits Gait & Station: normal Patient leans: N/A  Psychiatric Specialty Exam:  Presentation  General Appearance: Fairly Groomed  Eye Contact:Good  Speech:Normal Rate  Speech Volume:Normal  Handedness:Right  Mood and Affect  Mood:Euthymic  Affect:Congruent  Thought Process  Thought Processes:Coherent  Descriptions of Associations:Intact  Orientation:Full (Time, Place and Person)  Thought Content:Logical  History of Schizophrenia/Schizoaffective disorder:No  Duration of Psychotic Symptoms:N/A  Hallucinations:Hallucinations: None Description of Auditory Hallucinations: NA Description of Visual Hallucinations: NA  Ideas of Reference:None  Suicidal Thoughts:Suicidal Thoughts: No SI Active Intent and/or Plan: Without Intent; Without Plan; Without Means to Carry Out; Without Access to Means SI Passive Intent and/or Plan: Without Intent; Without Plan; Without Means to Carry Out  Homicidal Thoughts:Homicidal Thoughts: No  Sensorium  Memory:Recent Good; Remote Good; Immediate Good  Judgment:Good  Insight:Good  Executive Functions  Concentration:Good  Attention Span:Good  [place] of Knowledge:Good  Language:Good  Psychomotor Activity  Psychomotor Activity:Psychomotor Activity: Normal  Assets  Assets:Communication Skills; Desire for Improvement;  Social Support  Sleep  Sleep:Sleep: Good Number of Hours of Sleep: 6.75  Physical Exam: Physical Exam Vitals and nursing note reviewed.  Constitutional:      Appearance: Normal appearance.  HENT:     Head: Normocephalic and atraumatic.     Right Ear: External ear normal.     Left Ear: External ear normal.     Nose: Nose normal.     Mouth/Throat:     Mouth: Mucous membranes are moist.     Pharynx: Oropharynx is clear.  Eyes:     Extraocular Movements: Extraocular movements intact.     Conjunctiva/sclera: Conjunctivae normal.     Pupils: Pupils are equal, round, and reactive to light.  Cardiovascular:     Rate and Rhythm: Normal rate.     Pulses: Normal pulses.  Pulmonary:     Effort: Pulmonary effort is normal.  Abdominal:     General: Abdomen is flat.     Palpations: Abdomen is soft.  Musculoskeletal:        General: No swelling. Normal range of motion.     Cervical back: Normal range of motion and neck supple.  Skin:    General: Skin is warm and dry.  Neurological:     General: No focal deficit present.     Mental Status: She is alert and oriented to person, place, and time.  Psychiatric:  Mood and Affect: Mood normal.        Behavior: Behavior normal.        Thought Content: Thought content normal.        Judgment: Judgment normal.   Review of Systems  Constitutional: Negative.   HENT: Negative.    Eyes: Negative.   Respiratory: Negative.    Cardiovascular: Negative.   Gastrointestinal: Negative.   Genitourinary: Negative.   Musculoskeletal: Negative.   Skin: Negative.   Neurological: Negative.   Endo/Heme/Allergies: Negative.   Psychiatric/Behavioral:  Negative for depression, hallucinations and suicidal ideas. The patient does not have insomnia.   Blood pressure 112/89, pulse 92, temperature 98.2 F (36.8 C), temperature source Oral, resp. rate 18, height _0  (1.676 m), weight 95.3 kg, last menstrual period 06/19/2018, SpO2 (!) 87 %. Body mass  index is 33.89 kg/m.  Have you used any form of tobacco in the last 30 days? (Cigarettes, Smokeless Tobacco, Cigars, and/or Pipes): Yes  Has this patient used any form of tobacco in the last 30 days? (Cigarettes,  Smokeless Tobacco, Cigars, and/or Pipes)  N/A  Blood Alcohol level:  Lab Results  Component Value Date   ETH <10 07/11/2020   ETH <10 11/11/8525   Metabolic Disorder Labs:  Lab Results  Component Value Date   HGBA1C 5.5 07/09/2020   MPG 111.15 07/09/2020   MPG 111.15 01/24/2020   No results found for: PROLACTIN Lab Results  Component Value Date   CHOL 143 07/09/2020   TRIG 88 07/09/2020   HDL 65 07/09/2020   CHOLHDL 2.2 07/09/2020   VLDL 18 07/09/2020   LDLCALC 60 07/09/2020   LDLCALC 64 05/17/2020   See Psychiatric Specialty Exam and Suicide Risk Assessment completed by Attending Physician prior to discharge.  Discharge destination:  Home  Is patient on multiple antipsychotic therapies at discharge:  No   Has Patient had three or more failed trials of antipsychotic monotherapy by history:  No  Recommended Plan for Multiple Antipsychotic Therapies: NA  Discharge Instructions     Diet - low sodium heart healthy   Complete by: As directed    Increase activity slowly   Complete by: As directed       Allergies as of 07/18/2020   No Known Allergies      Medication List     STOP taking these medications    gabapentin 300 MG capsule Commonly known as: NEURONTIN Replaced by: gabapentin 800 MG tablet       TAKE these medications      Indication  amLODipine 10 MG tablet Commonly known as: NORVASC Take 1 tablet (10 mg total) by mouth daily.  Indication: High Blood Pressure Disorder   FLUoxetine 40 MG capsule Commonly known as: PROZAC Take 1 capsule (40 mg total) by mouth daily. What changed:  medication strength Another medication with the same name was removed. Continue taking this medication, and follow the directions you see here.   Indication: Depression   gabapentin 800 MG tablet Commonly known as: NEURONTIN Take 1 tablet (800 mg total) by mouth 3 (three) times daily. Replaces: gabapentin 300 MG capsule  Indication: Neuropathic Pain   ibuprofen 200 MG tablet Commonly known as: ADVIL Take 400 mg by mouth every 6 (six) hours as needed for headache (pain).  Indication: Backache   prazosin 1 MG capsule Commonly known as: MINIPRESS Take 3 capsules (3 mg total) by mouth at bedtime.  Indication: Frightening Dreams   QUEtiapine 200 MG tablet Commonly known as: SEROQUEL Take 2  tablets (400 mg total) by mouth at bedtime. What changed: Another medication with the same name was changed. Make sure you understand how and when to take each.  Indication: Major Depressive Disorder   QUEtiapine 300 MG tablet Commonly known as: SEROQUEL Take 2 tablets (600 mg total) by mouth at bedtime. What changed:  medication strength how much to take  Indication: Depressive Phase of Manic-Depression   traZODone 100 MG tablet Commonly known as: DESYREL Take 1 tablet (100 mg total) by mouth at bedtime. What changed:  medication strength how much to take  Indication: [place], [facility] Follow up.   Why: Please continue to follow up with this facility with ACTT services. Contact information: [facility] [address] [place] [address] [phone]                Follow-up recommendations: Activity:  As tolerated Diet: As recommended by your primary care doctor. Keep all scheduled follow-up appointments as recommended.    Comments:  Charles symptoms responded well to his treatment regimen. His symptoms has subsided & mood stable. Patient has met the maximum benefit of his hospitalization. He is currently mentally & medically stable to continue mental health care & medication management on an outpatient basis as noted below. He is provided with all the necessary  information needed to make this appointment without problems. Upon discharge, Juanda Crumble adamantly denies any suicidal/homicidal ideations, auditory/visual hallucinations delusional thinking, paranoia or substance withdrawal symptoms. He left [facility] with all personal belongings in no apparent distress. Transportation per family Magazine features editor).   Signed: Lindell Spar, NP, pmhnp, fnp-bc 07/18/2020, 9:01 AM

## 2020-07-18 NOTE — Progress Notes (Signed)
D: Rachel Nolan A & O X 3. Denies SI, HI, AVH and pain at this time. D/C home as ordered. Picked up in front of facility by family member.  A: D/C instructions reviewed with Rachel Nolan including electronic prescriptions, medication samples and follow up appointment; compliance encouraged. All belongings from assigned locker given to Rachel Nolan at time of departure. Scheduled and PRN medications given with verbal education and effects monitored. Safety checks maintained without incident till time of d/c.  R: Rachel Nolan receptive to care. Compliant with medications when offered. Denies adverse drug reactions when assessed. Verbalized understanding related to d/c instructions. Signed belonging sheet in agreement with items received from locker. Ambulatory with a slow but steady gait. Appears to be in no physical distress at time of departure.

## 2020-07-18 NOTE — Tx Team (Signed)
Interdisciplinary Treatment and Diagnostic Plan Update  07/18/2020 Time of Session: 11:15am Cambria Osten MRN: [id number]  Principal Diagnosis: MDD (major depressive disorder), recurrent, severe, with psychosis (HCC)  Secondary Diagnoses: Principal Problem:   MDD (major depressive disorder), recurrent, severe, with psychosis (HCC) Active Problems:   Cocaine-induced mood disorder (HCC)   Polysubstance abuse (HCC)   Current Medications:  Current Facility-Administered Medications  Medication Dose Route Frequency Provider Last Rate Last Admin   acetaminophen (TYLENOL) tablet 650 mg  650 mg Oral Q6H PRN Bobbitt, Shalon E, NP   650 mg at 07/13/20 2130   alum & mag hydroxide-simeth (MAALOX/MYLANTA) 200-200-20 MG/5ML suspension 30 mL  30 mL Oral Q4H PRN Bobbitt, Shalon E, NP       amLODipine (NORVASC) tablet 10 mg  10 mg Oral Daily Laveda Abbe, NP   10 mg at 07/18/20 0835   FLUoxetine (PROZAC) capsule 40 mg  40 mg Oral Daily Laveda Abbe, NP   40 mg at 07/18/20 3976   gabapentin (NEURONTIN) tablet 800 mg  800 mg Oral TID Antonieta Pert, MD   800 mg at 07/18/20 7341   hydrOXYzine (ATARAX/VISTARIL) tablet 25 mg  25 mg Oral TID PRN Ajibola, Ene A, NP       ibuprofen (ADVIL) tablet 400 mg  400 mg Oral Q6H PRN Laveda Abbe, NP   400 mg at 07/15/20 2129   magnesium hydroxide (MILK OF MAGNESIA) suspension 30 mL  30 mL Oral Daily PRN Bobbitt, Shalon E, NP       prazosin (MINIPRESS) capsule 3 mg  3 mg Oral QHS Laveda Abbe, NP   3 mg at 07/17/20 2057   QUEtiapine (SEROQUEL) tablet 600 mg  600 mg Oral QHS Antonieta Pert, MD   600 mg at 07/17/20 2057   traZODone (DESYREL) tablet 100 mg  100 mg Oral QHS Antonieta Pert, MD   100 mg at 07/17/20 2057   PTA Medications: Medications Prior to Admission  Medication Sig Dispense Refill Last Dose   FLUoxetine (PROZAC) 20 MG capsule Take 40 mg by mouth daily.      FLUoxetine (PROZAC) 40 MG capsule Take 1 capsule  (40 mg total) by mouth daily. (Patient not taking: No sig reported) 7 capsule 0    gabapentin (NEURONTIN) 300 MG capsule Take 2 capsules (600 mg total) by mouth 3 (three) times daily. 180 capsule 1    ibuprofen (ADVIL) 200 MG tablet Take 400 mg by mouth every 6 (six) hours as needed for headache (pain).      QUEtiapine (SEROQUEL) 400 MG tablet Take 400 mg by mouth at bedtime.      traZODone (DESYREL) 150 MG tablet Take 1 tablet (150 mg total) by mouth at bedtime. 7 tablet 0    [DISCONTINUED] amLODipine (NORVASC) 10 MG tablet Take 1 tablet (10 mg total) by mouth daily. 7 tablet 0    [DISCONTINUED] prazosin (MINIPRESS) 1 MG capsule Take 3 capsules (3 mg total) by mouth at bedtime. 21 capsule 0    [DISCONTINUED] QUEtiapine (SEROQUEL) 200 MG tablet Take 2 tablets (400 mg total) by mouth at bedtime. (Patient not taking: No sig reported) 14 tablet 0     Patient Stressors: Financial difficulties Health problems Loss of Nephew  Patient Strengths: Wellsite geologist fund of knowledge Supportive family/friends  Treatment Modalities: Medication Management, Group therapy, Case management,  1 to 1 session with clinician, Psychoeducation, Recreational therapy.   Physician Treatment Plan for Primary Diagnosis: MDD (major depressive disorder),  recurrent, severe, with psychosis (HCC) Long Term Goal(s): Improvement in symptoms so as ready for discharge   Short Term Goals: Ability to identify changes in lifestyle to reduce recurrence of condition will improve Ability to verbalize feelings will improve Ability to disclose and discuss suicidal ideas Ability to identify and develop effective coping behaviors will improve Compliance with prescribed medications will improve Ability to identify triggers associated with substance abuse/mental health issues will improve  Medication Management: Evaluate patient's response, side effects, and tolerance of medication regimen.  Therapeutic Interventions:  1 to 1 sessions, Unit Group sessions and Medication administration.  Evaluation of Outcomes: Adequate for Discharge  Physician Treatment Plan for Secondary Diagnosis: Principal Problem:   MDD (major depressive disorder), recurrent, severe, with psychosis (HCC) Active Problems:   Cocaine-induced mood disorder (HCC)   Polysubstance abuse (HCC)  Long Term Goal(s): Improvement in symptoms so as ready for discharge   Short Term Goals: Ability to identify changes in lifestyle to reduce recurrence of condition will improve Ability to verbalize feelings will improve Ability to disclose and discuss suicidal ideas Ability to identify and develop effective coping behaviors will improve Compliance with prescribed medications will improve Ability to identify triggers associated with substance abuse/mental health issues will improve     Medication Management: Evaluate patient's response, side effects, and tolerance of medication regimen.  Therapeutic Interventions: 1 to 1 sessions, Unit Group sessions and Medication administration.  Evaluation of Outcomes: Adequate for Discharge   RN Treatment Plan for Primary Diagnosis: MDD (major depressive disorder), recurrent, severe, with psychosis (HCC) Long Term Goal(s): Knowledge of disease and therapeutic regimen to maintain health will improve  Short Term Goals: Ability to remain free from injury will improve, Ability to verbalize frustration and anger appropriately will improve, Ability to identify and develop effective coping behaviors will improve, and Compliance with prescribed medications will improve  Medication Management: RN will administer medications as ordered by provider, will assess and evaluate patient's response and provide education to patient for prescribed medication. RN will report any adverse and/or side effects to prescribing provider.  Therapeutic Interventions: 1 on 1 counseling sessions, Psychoeducation, Medication administration,  Evaluate responses to treatment, Monitor vital signs and CBGs as ordered, Perform/monitor CIWA, COWS, AIMS and Fall Risk screenings as ordered, Perform wound care treatments as ordered.  Evaluation of Outcomes: Adequate for Discharge   LCSW Treatment Plan for Primary Diagnosis: MDD (major depressive disorder), recurrent, severe, with psychosis (HCC) Long Term Goal(s): Safe transition to appropriate next level of care at discharge, Engage patient in therapeutic group addressing interpersonal concerns.  Short Term Goals: Engage patient in aftercare planning with referrals and resources, Increase social support, Increase ability to appropriately verbalize feelings, Identify triggers associated with mental health/substance abuse issues, and Increase skills for wellness and recovery  Therapeutic Interventions: Assess for all discharge needs, 1 to 1 time with Social worker, Explore available resources and support systems, Assess for adequacy in community support network, Educate family and significant other(s) on suicide prevention, Complete Psychosocial Assessment, Interpersonal group therapy.  Evaluation of Outcomes: Adequate for Discharge   Progress in Treatment: Attending groups: No. Participating in groups: No. Taking medication as prescribed: Yes. Toleration medication: Yes. Family/Significant other contact made: No, will contact:  friend Patient understands diagnosis: Yes. Discussing patient identified problems/goals with staff: Yes. Medical problems stabilized or resolved: Yes. Denies suicidal/homicidal ideation: Yes. Issues/concerns per patient self-inventory: No.   New problem(s) identified: No, Describe:  none  New Short Term/Long Term Goal(s): detox, medication management for mood stabilization;  elimination of SI thoughts; development of comprehensive mental wellness/sobriety plan  Patient Goals:  Did not attend  Discharge Plan or Barriers: Patient recently admitted. CSW will  continue to follow and assess for appropriate referrals and possible discharge planning.    Reason for Continuation of Hospitalization: Delusions  Hallucinations Medication stabilization Suicidal ideation Withdrawal symptoms  Estimated Length of Stay: 3-5 days  Attendees: Patient: Did not attend 07/18/2020  Physician:  07/18/2020  Nursing:  07/18/2020  RN Care Manager: 07/18/2020  Social Worker: Ruthann Cancer, LCSW 07/18/2020  Recreational Therapist:  07/18/2020  Other:  07/18/2020  Other:  07/18/2020  Other: 07/18/2020    Scribe for Treatment Team: Felizardo Hoffmann, LCSWA 07/18/2020 9:54 AM

## 2020-07-25 ENCOUNTER — Other Ambulatory Visit: Payer: Self-pay

## 2020-08-20 ENCOUNTER — Other Ambulatory Visit: Payer: Self-pay

## 2020-08-21 ENCOUNTER — Other Ambulatory Visit: Payer: Self-pay

## 2020-08-22 DIAGNOSIS — Z5900 Homelessness unspecified: Secondary | ICD-10-CM

## 2020-09-01 ENCOUNTER — Other Ambulatory Visit: Payer: Self-pay

## 2020-09-01 MED ORDER — PRAZOSIN HCL 2 MG PO CAPS
2.0000 mg | ORAL_CAPSULE | Freq: Every evening | ORAL | 1 refills | Status: DC
  Filled 2020-09-01: qty 30, 30d supply, fill #0

## 2020-09-01 MED ORDER — ALBUTEROL SULFATE HFA 108 (90 BASE) MCG/ACT IN AERS
2.0000 | INHALATION_SPRAY | Freq: Four times a day (QID) | RESPIRATORY_TRACT | 1 refills | Status: DC | PRN
  Filled 2020-09-01: qty 8.5, 25d supply, fill #0

## 2020-09-01 MED ORDER — GABAPENTIN 300 MG PO CAPS
300.0000 mg | ORAL_CAPSULE | Freq: Three times a day (TID) | ORAL | 1 refills | Status: DC
  Filled 2020-09-01: qty 90, 30d supply, fill #0
  Filled 2020-09-12 – 2020-09-30 (×2): qty 90, 30d supply, fill #1

## 2020-09-01 MED ORDER — QUETIAPINE FUMARATE 300 MG PO TABS: 600.0000 mg | ORAL_TABLET | Freq: Every evening | ORAL | 1 refills | Status: DC

## 2020-09-03 ENCOUNTER — Other Ambulatory Visit: Payer: Self-pay

## 2020-09-12 ENCOUNTER — Other Ambulatory Visit: Payer: Self-pay

## 2020-09-19 ENCOUNTER — Other Ambulatory Visit: Payer: Self-pay

## 2020-09-20 ENCOUNTER — Inpatient Hospital Stay (HOSPITAL_COMMUNITY)
Admission: RE | Admit: 2020-09-20 | Discharge: 2020-09-27 | DRG: 885 | Disposition: A | Discharge status: Home / Self Care | Payer: No Typology Code available for payment source | Attending: Emergency Medicine | Admitting: Emergency Medicine

## 2020-09-20 DIAGNOSIS — F1290 Cannabis use, unspecified, uncomplicated: Secondary | ICD-10-CM | POA: Diagnosis present

## 2020-09-20 DIAGNOSIS — Z6829 Body mass index (BMI) 29.0-29.9, adult: Secondary | ICD-10-CM

## 2020-09-20 DIAGNOSIS — Z62820 Parent-biological child conflict: Secondary | ICD-10-CM | POA: Diagnosis present

## 2020-09-20 DIAGNOSIS — F333 Major depressive disorder, recurrent, severe with psychotic symptoms: Secondary | ICD-10-CM | POA: Diagnosis present

## 2020-09-20 DIAGNOSIS — K219 Gastro-esophageal reflux disease without esophagitis: Secondary | ICD-10-CM | POA: Diagnosis present

## 2020-09-20 DIAGNOSIS — Z20822 Contact with and (suspected) exposure to covid-19: Secondary | ICD-10-CM | POA: Diagnosis present

## 2020-09-20 DIAGNOSIS — Z9119 Patient's noncompliance with other medical treatment and regimen: Secondary | ICD-10-CM

## 2020-09-20 DIAGNOSIS — F1010 Alcohol abuse, uncomplicated: Secondary | ICD-10-CM | POA: Diagnosis present

## 2020-09-20 DIAGNOSIS — Z91410 Personal history of adult physical and sexual abuse: Secondary | ICD-10-CM

## 2020-09-20 DIAGNOSIS — Z5900 Homelessness unspecified: Secondary | ICD-10-CM

## 2020-09-20 DIAGNOSIS — R9431 Abnormal electrocardiogram [ECG] [EKG]: Secondary | ICD-10-CM | POA: Diagnosis present

## 2020-09-20 DIAGNOSIS — Z9151 Personal history of suicidal behavior: Secondary | ICD-10-CM

## 2020-09-20 DIAGNOSIS — F4310 Post-traumatic stress disorder, unspecified: Secondary | ICD-10-CM | POA: Diagnosis present

## 2020-09-20 DIAGNOSIS — F1414 Cocaine abuse with cocaine-induced mood disorder: Secondary | ICD-10-CM | POA: Diagnosis present

## 2020-09-20 DIAGNOSIS — R000 Tachycardia, unspecified: Secondary | ICD-10-CM | POA: Diagnosis present

## 2020-09-20 DIAGNOSIS — R058 Other specified cough: Secondary | ICD-10-CM | POA: Diagnosis present

## 2020-09-20 DIAGNOSIS — R45851 Suicidal ideations: Secondary | ICD-10-CM | POA: Diagnosis present

## 2020-09-20 DIAGNOSIS — Z5901 Sheltered homelessness: Secondary | ICD-10-CM | POA: Diagnosis not present

## 2020-09-20 DIAGNOSIS — I10 Essential (primary) hypertension: Secondary | ICD-10-CM | POA: Diagnosis present

## 2020-09-20 DIAGNOSIS — J45901 Unspecified asthma with (acute) exacerbation: Secondary | ICD-10-CM | POA: Diagnosis present

## 2020-09-20 DIAGNOSIS — Z720 Tobacco use: Secondary | ICD-10-CM | POA: Diagnosis present

## 2020-09-20 DIAGNOSIS — G629 Polyneuropathy, unspecified: Secondary | ICD-10-CM | POA: Diagnosis present

## 2020-09-20 DIAGNOSIS — J209 Acute bronchitis, unspecified: Secondary | ICD-10-CM | POA: Diagnosis present

## 2020-09-20 DIAGNOSIS — M792 Neuralgia and neuritis, unspecified: Secondary | ICD-10-CM | POA: Diagnosis present

## 2020-09-20 DIAGNOSIS — R45850 Homicidal ideations: Secondary | ICD-10-CM | POA: Diagnosis present

## 2020-09-20 DIAGNOSIS — F1410 Cocaine abuse, uncomplicated: Secondary | ICD-10-CM | POA: Diagnosis present

## 2020-09-20 DIAGNOSIS — F331 Major depressive disorder, recurrent, moderate: Principal | ICD-10-CM | POA: Diagnosis not present

## 2020-09-20 DIAGNOSIS — Z8249 Family history of ischemic heart disease and other diseases of the circulatory system: Secondary | ICD-10-CM

## 2020-09-20 DIAGNOSIS — F17210 Nicotine dependence, cigarettes, uncomplicated: Secondary | ICD-10-CM | POA: Diagnosis present

## 2020-09-20 DIAGNOSIS — R634 Abnormal weight loss: Secondary | ICD-10-CM | POA: Diagnosis present

## 2020-09-20 DIAGNOSIS — F323 Major depressive disorder, single episode, severe with psychotic features: Secondary | ICD-10-CM | POA: Diagnosis not present

## 2020-09-20 DIAGNOSIS — J45909 Unspecified asthma, uncomplicated: Secondary | ICD-10-CM | POA: Diagnosis not present

## 2020-09-20 DIAGNOSIS — F1994 Other psychoactive substance use, unspecified with psychoactive substance-induced mood disorder: Secondary | ICD-10-CM | POA: Diagnosis present

## 2020-09-20 MED ORDER — HYDROXYZINE HCL 25 MG PO TABS
ORAL_TABLET | ORAL | Status: AC
  Filled 2020-09-20: qty 1

## 2020-09-20 MED ORDER — TRAZODONE HCL 50 MG PO TABS
50.0000 mg | ORAL_TABLET | Freq: Every evening | ORAL | Status: DC | PRN
  Administered 2020-09-21 – 2020-09-26 (×6): 50 mg via ORAL
  Filled 2020-09-20: qty 7
  Filled 2020-09-20 (×6): qty 1

## 2020-09-20 MED ORDER — ACETAMINOPHEN 325 MG PO TABS
650.0000 mg | ORAL_TABLET | Freq: Four times a day (QID) | ORAL | Status: DC | PRN
  Administered 2020-09-22 – 2020-09-26 (×8): 650 mg via ORAL
  Filled 2020-09-20 (×7): qty 2

## 2020-09-20 MED ORDER — HYDROXYZINE HCL 25 MG PO TABS
25.0000 mg | ORAL_TABLET | Freq: Three times a day (TID) | ORAL | Status: DC | PRN
  Administered 2020-09-21 – 2020-09-26 (×6): 25 mg via ORAL
  Filled 2020-09-20 (×4): qty 1
  Filled 2020-09-20: qty 10
  Filled 2020-09-20 (×2): qty 1
  Filled 2020-09-20: qty 10

## 2020-09-20 MED ORDER — ALUM & MAG HYDROXIDE-SIMETH 200-200-20 MG/5ML PO SUSP: 30.0000 mL | ORAL | Status: DC | PRN

## 2020-09-20 MED ORDER — HYDROXYZINE HCL 25 MG PO TABS
25.0000 mg | ORAL_TABLET | Freq: Once | ORAL | Status: AC
  Administered 2020-09-21: 25 mg via ORAL
  Filled 2020-09-20: qty 1

## 2020-09-20 MED ORDER — MAGNESIUM HYDROXIDE 400 MG/5ML PO SUSP: 30.0000 mL | Freq: Every day | ORAL | Status: DC | PRN
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
## 2020-09-21 ENCOUNTER — Other Ambulatory Visit: Payer: Self-pay

## 2020-09-21 ENCOUNTER — Encounter (HOSPITAL_COMMUNITY): Payer: Self-pay | Admitting: Physician Assistant

## 2020-09-21 DIAGNOSIS — F333 Major depressive disorder, recurrent, severe with psychotic symptoms: Secondary | ICD-10-CM | POA: Diagnosis not present

## 2020-09-21 DIAGNOSIS — F1010 Alcohol abuse, uncomplicated: Secondary | ICD-10-CM | POA: Diagnosis present

## 2020-09-21 LAB — LIPID PANEL
Cholesterol: 145 mg/dL (ref 0–200)
HDL: 67 mg/dL (ref >40)
LDL Cholesterol: 56 mg/dL (ref 0–99)
Total CHOL/HDL Ratio: 2.2 RATIO
Triglycerides: 109 mg/dL (ref <150)
VLDL: 22 mg/dL (ref 0–40)

## 2020-09-21 LAB — RESP PANEL BY RT-PCR (FLU A&B, COVID) ARPGX2
Influenza A by PCR: NEGATIVE
Influenza B by PCR: NEGATIVE
SARS Coronavirus 2 by RT PCR: NEGATIVE

## 2020-09-21 LAB — ETHANOL: Alcohol, Ethyl (B): <10 mg/dL (ref <10)

## 2020-09-21 LAB — POC SARS CORONAVIRUS 2 AG: SARSCOV2ONAVIRUS 2 AG: NEGATIVE

## 2020-09-21 LAB — TSH: TSH: 1.111 u[IU]/mL (ref 0.350–4.500)

## 2020-09-21 MED ORDER — LOPERAMIDE HCL 2 MG PO CAPS: 2.0000 mg | ORAL_CAPSULE | ORAL | Status: AC | PRN

## 2020-09-21 MED ORDER — GABAPENTIN 100 MG PO CAPS
100.0000 mg | ORAL_CAPSULE | Freq: Three times a day (TID) | ORAL | Status: DC
  Administered 2020-09-21 – 2020-09-22 (×4): 100 mg via ORAL
  Filled 2020-09-21 (×11): qty 1

## 2020-09-21 MED ORDER — ALBUTEROL SULFATE HFA 108 (90 BASE) MCG/ACT IN AERS
2.0000 | INHALATION_SPRAY | RESPIRATORY_TRACT | Status: DC | PRN
  Administered 2020-09-21 – 2020-09-24 (×7): 2 via RESPIRATORY_TRACT
  Filled 2020-09-21: qty 6.7

## 2020-09-21 MED ORDER — AMLODIPINE BESYLATE 10 MG PO TABS
10.0000 mg | ORAL_TABLET | Freq: Every day | ORAL | Status: DC
  Administered 2020-09-21 – 2020-09-27 (×7): 10 mg via ORAL
  Filled 2020-09-21 (×7): qty 1
  Filled 2020-09-21: qty 7
  Filled 2020-09-21: qty 1

## 2020-09-21 MED ORDER — ADULT MULTIVITAMIN W/MINERALS CH
1.0000 | ORAL_TABLET | Freq: Every day | ORAL | Status: DC
  Administered 2020-09-21 – 2020-09-27 (×7): 1 via ORAL
  Filled 2020-09-21 (×10): qty 1

## 2020-09-21 MED ORDER — THIAMINE HCL 100 MG/ML IJ SOLN
100.0000 mg | Freq: Once | INTRAMUSCULAR | Status: AC
  Administered 2020-09-21: 100 mg via INTRAMUSCULAR
  Filled 2020-09-21: qty 2

## 2020-09-21 MED ORDER — ONDANSETRON 4 MG PO TBDP: 4.0000 mg | ORAL_TABLET | Freq: Four times a day (QID) | ORAL | Status: AC | PRN

## 2020-09-21 MED ORDER — LORAZEPAM 1 MG PO TABS: 1.0000 mg | ORAL_TABLET | Freq: Four times a day (QID) | ORAL | Status: AC | PRN

## 2020-09-21 MED ORDER — PRAZOSIN HCL 1 MG PO CAPS
1.0000 mg | ORAL_CAPSULE | Freq: Every day | ORAL | Status: DC
  Administered 2020-09-21 – 2020-09-26 (×6): 1 mg via ORAL
  Filled 2020-09-21: qty 1
  Filled 2020-09-21: qty 7
  Filled 2020-09-21 (×7): qty 1
  Filled 2020-09-21: qty 7

## 2020-09-21 MED ORDER — THIAMINE HCL 100 MG PO TABS
100.0000 mg | ORAL_TABLET | Freq: Every day | ORAL | Status: DC
  Administered 2020-09-22 – 2020-09-27 (×6): 100 mg via ORAL
  Filled 2020-09-21 (×8): qty 1

## 2020-09-21 MED ORDER — QUETIAPINE FUMARATE 100 MG PO TABS
100.0000 mg | ORAL_TABLET | Freq: Every day | ORAL | Status: DC
  Administered 2020-09-21: 100 mg via ORAL
  Filled 2020-09-21 (×3): qty 1

## 2020-09-21 MED ORDER — FLUOXETINE HCL 10 MG PO CAPS
10.0000 mg | ORAL_CAPSULE | Freq: Every day | ORAL | Status: DC
  Administered 2020-09-21 – 2020-09-23 (×3): 10 mg via ORAL
  Filled 2020-09-21 (×5): qty 1

## 2020-09-21 NOTE — [facility] Group Notes (Signed)
Patient received group material 

## 2020-09-21 NOTE — [facility] Group Notes (Signed)
Patient receive material for group this morning 

## 2020-09-21 NOTE — Plan of Care (Signed)
Patient  has been irritable, anxious and resistant to care: refused EKG. Became agitated during assessment for withdrawals "why you all keep asking me those questions...you think all I do is to drink ?". Patient spent most of her day in bed. Mentioned that she has housing issues: currently homeless. Continues to express passive SI. Denies AVH.  Eating well. Was encouraged to express her concerns as needed. Safety precautions reinforced.

## 2020-09-21 NOTE — Plan of Care (Signed)
Verified with pharmacist that patient just filled the following medications with plans to restart and retitrate onto doses since she has been off meds over a week:   Prozac 40mg  filled 8/4 - restarting at 10mg  daily  Neurontin 300mg  tid filled 8/15 - restarting 100mg  tid Prazosin 2mg  filled 8/15 - restarting at 1mg  qhs Norvasc 10mg  filled 8/4 - restarting at 10mg  daily since she is hypertensive today Trazodone 100mg  filled 8/4 - restarting at 50mg  qhs PRN insomnia Seroquel 600mg  filled 8/4 but then canceled in late August - restarting at 100mg  qhs  9/15, MD, 

## 2020-09-21 NOTE — [facility] Counselor (Signed)
Clinical Social Work Note  CSW called [facility] [phone]) to inquire about patient's baseline, left a voicemail asking for a call back to discuss.  If needed, crisis number is [phone].  Ambrose Mantle, LCSW 09/21/2020, 12:01 PM

## 2020-09-21 NOTE — Tx Team (Signed)
Initial Treatment Plan 09/21/2020 2:59 AM [name] RKY:[id number]    PATIENT STRESSORS: Marital or family conflict   Substance abuse     PATIENT STRENGTHS: Motivation for treatment/growth    PATIENT IDENTIFIED PROBLEMS: Suicidal Ideation  Depression  "Patient did not identify any goals at this time"                 DISCHARGE CRITERIA:  Improved stabilization in mood, thinking, and/or behavior Verbal commitment to aftercare and medication compliance  PRELIMINARY DISCHARGE PLAN: Outpatient therapy Return to previous living arrangement  PATIENT/FAMILY INVOLVEMENT: This treatment plan has been presented to and reviewed with the patient, Rachel Nolan, and/or family member.  The patient and family have been given the opportunity to ask questions and make suggestions.  Criss Rosales, RN 09/21/2020, 2:59 AM
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
## 2020-09-21 NOTE — BHH Group Notes (Signed)
Relaxation-music therapy 

## 2020-09-21 NOTE — Group Note (Signed)
Clinical Social Work Note  Group was not held in person due to COVID-19 outbreak and isolation requirements on the unit. CSW provided handouts on building healthy supports to work on individually in rooms.  Jennett Tarbell Grossman-Orr, LCSW 09/21/2020, 12:19 PM    

## 2020-09-21 NOTE — [facility] Suicide Risk Assessment (Signed)
[facility] INPATIENT:  Family/Significant Other Suicide Prevention Education  Suicide Prevention Education:  Patient Refusal for Family/Significant Other Suicide Prevention Education: The patient Rachel Nolan has refused to provide written consent for family/significant other to be provided Family/Significant Other Suicide Prevention Education during admission and/or prior to discharge.  Physician notified.  Carloyn Jaeger Grossman-Orr 09/21/2020, 11:56 AM

## 2020-09-22 ENCOUNTER — Encounter (HOSPITAL_COMMUNITY): Payer: Self-pay

## 2020-09-22 DIAGNOSIS — F333 Major depressive disorder, recurrent, severe with psychotic symptoms: Secondary | ICD-10-CM | POA: Diagnosis not present

## 2020-09-22 LAB — RESP PANEL BY RT-PCR (FLU A&B, COVID) ARPGX2
Influenza A by PCR: NEGATIVE
Influenza B by PCR: NEGATIVE
SARS Coronavirus 2 by RT PCR: NEGATIVE

## 2020-09-22 MED ORDER — GABAPENTIN 100 MG PO CAPS
200.0000 mg | ORAL_CAPSULE | Freq: Three times a day (TID) | ORAL | Status: AC
  Administered 2020-09-22 – 2020-09-23 (×4): 200 mg via ORAL
  Filled 2020-09-22 (×8): qty 2

## 2020-09-22 MED ORDER — GUAIFENESIN ER 600 MG PO TB12
1200.0000 mg | ORAL_TABLET | Freq: Two times a day (BID) | ORAL | Status: DC | PRN
  Administered 2020-09-23 – 2020-09-24 (×2): 1200 mg via ORAL
  Filled 2020-09-22 (×2): qty 2

## 2020-09-22 MED ORDER — QUETIAPINE FUMARATE 200 MG PO TABS
200.0000 mg | ORAL_TABLET | Freq: Every day | ORAL | Status: DC
  Administered 2020-09-22 – 2020-09-26 (×5): 200 mg via ORAL
  Filled 2020-09-22: qty 7
  Filled 2020-09-22 (×6): qty 1

## 2020-09-22 NOTE — Progress Notes (Signed)
NUTRITION ASSESSMENT RD working remotely.   Pt identified as at risk on the Malnutrition Screen Tool  INTERVENTION: [facility] staff to continue to encourage PO intakes of meals and snacks.  NUTRITION DIAGNOSIS: Unintentional weight loss related to sub-optimal intake as evidenced by pt report.   Goal: Pt to meet >/= 90% of their estimated nutrition needs.  Monitor:  PO intake  Assessment:  Patient with hx of MDD, PTS, and polysubstance abuse. She presented as a walk in due to SI and HI. She reported poor sleep and sleep quality. She drinks 2 beers/day and uses marijuana daily.   Weight yesterday was documented as 180 lb, which appears to be a stated weight. Weight on 04/21/20 was 188 lb. This would indicate 8 lb weight loss (4.2% body weight) in 5 months; not significant for time frame.      50 y.o. female  Height: Ht Readings from Last 1 Encounters:  09/21/20 5\' 6"  (1.676 m)    Weight: Wt Readings from Last 1 Encounters:  09/21/20 81.6 kg    Weight Hx: Wt Readings from Last 10 Encounters:  09/21/20 81.6 kg  07/13/20 95.3 kg  07/11/20 95.3 kg  05/16/20 89.4 kg  05/15/20 85.7 kg  04/23/20 85.7 kg  04/21/20 85.7 kg  03/25/20 84.4 kg  03/24/20 82 kg  01/23/20 82.6 kg    BMI:  Body mass index is 29.05 kg/m. Pt meets criteria for overweight status based on current BMI.  Estimated Nutritional Needs: Kcal: 25-30 kcal/kg Protein: > 1 gram protein/kg Fluid: 1 ml/kcal  Diet Order:  Diet Order             Diet regular Room service appropriate? Yes; Fluid consistency: Thin  Diet effective now                  Pt is also offered choice of unit snacks mid-morning and mid-afternoon.  Pt is eating as desired.   Lab results and medications reviewed.     03/22/20, MS, RD, LDN, CNSC Inpatient Clinical Dietitian RD pager # available in [facility]  After hours/weekend pager # available in [facility]

## 2020-09-22 NOTE — Progress Notes (Addendum)
[facility] MD Progress Note  09/22/2020 11:01 AM Rachel Nolan  MRN:  [id number] Subjective:  Patient is a 50y/o female with h/o MDD with psychotic features, PTSD, and polysubstance abuse who walked-in to [facility] on 09/20/20 for help with SI and HI. She is well known to this service from previous inpatient admissions.  Patient is currently on Hospital Day 2.  Chart Review, 24 hr Events: The patient's chart was reviewed and nursing notes were reviewed. The patient's case was discussed in multidisciplinary team meeting.  Per MAR: - Patient is compliant with scheduled meds. - PRNs: Albuterol, Hydroxyzine 25 mg 11:57 9/4, Trazodone 50 mg 20:45 9/4 Per RN notes, pt was labile and active SI but able to contract for safety Patient slept, unknown number of hours  Information Obtained Today During Patient Interview: The patient was seen and evaluated on the unit with Dr. Mason Jim. Patient extremely agitated and shaking in bed. Patient states she is frustrated she has to "talk about my past" and that she is dealing with her "thoughts". Patient stated she was "not in a mood to talk to today". Patient states she is actively SI but unable to specify a plan. Patient became silent for a few minutes before loudly interrupting assessment "what is your question?" Multiple times in the middle of question. Patient insists her shaking and lability has nothing to do with her use of crack/cocaine and insists that "it is not a problem". Patient is able to contract for safety while in hospital and states she has been eating and sleeping appropriately. Patient denies HI/AVH, ideas of reference, or first rank symptoms although patient grew largely uncooperative throughout assessment especially near the end.   Principal Problem: MDD (major depressive disorder), recurrent, severe, with psychosis (HCC) Diagnosis: Principal Problem:   MDD (major depressive disorder), recurrent, severe, with psychosis (HCC) Active Problems:   Post  traumatic stress disorder (PTSD)   Cocaine use disorder (HCC)   Alcohol abuse  Total Time spent with patient:  I personally spent 30 minutes on the unit in direct patient care. The direct patient care time included face-to-face time with the patient, reviewing the patient's chart, communicating with other professionals, and coordinating care. Greater than 50% of this time was spent in counseling or coordinating care with the patient regarding goals of hospitalization, psycho-education, and discharge planning needs.   Past Psychiatric History:  PTSD Major depressive disorder with psychotic features Past history of polysubstance abuse  Past Medical History:  Past Medical History:  Diagnosis Date   Anxiety    Asthma    Depression    Hypertension    Scoliosis     Past Surgical History:  Procedure Laterality Date   BACK SURGERY     ECTOPIC PREGNANCY SURGERY     Family History:  Family History  Problem Relation Age of Onset   Hypertension Mother    Hypertension Father    Family Psychiatric  History: Father and sister - substance abuse Social History:  Social History   Substance and Sexual Activity  Alcohol Use Yes   Comment: one 40 oz per month     Social History   Substance and Sexual Activity  Drug Use Yes   Types: Marijuana, Cocaine, "Crack" cocaine    Social History   Socioeconomic History   Marital status: Widowed    Spouse name: Not on file   Number of children: Not on file   Years of education: Not on file   Highest education level: Not on file  Occupational  History   Not on file  Tobacco Use   Smoking status: Every Day    Packs/day: 1.00    Types: Cigarettes   Smokeless tobacco: Never  Vaping Use   Vaping Use: Never used  Substance and Sexual Activity   Alcohol use: Yes    Comment: one 40 oz per month   Drug use: Yes    Types: Marijuana, Cocaine, "Crack" cocaine   Sexual activity: Not Currently  Other Topics Concern   Not on file  Social  History Narrative   Pt is homeless, no fixed address; not followed by an outpatient psychiatrist   Social Determinants of Health   Financial Resource Strain: Not on file  Food Insecurity: Not on file  Transportation Needs: Not on file  Physical Activity: Not on file  Stress: Not on file  Social Connections: Not on file   Additional Social History:    Pain Medications: See previous d/c summary from [facility] in June '22. Prescriptions: See previous d/c summary from [facility] in June '22.  Pt has been using more than what is prescribed. Over the Counter: See previous d/c summary from [facility] in June '22 History of alcohol / drug use?: Yes Longest period of sobriety (when/how long): 3 months Name of Substance 1: ETOH 1 - Age of First Use: unknown 1 - Amount (size/oz): Unknown 1 - Frequency: 4-5 times ina  week 1 - Duration: ongoing 1 - Last Use / Amount: 09/03 Two beers 1 - Method of Aquiring: purchase 1- Route of Use: orally Name of Substance 2: Marijuana 2 - Age of First Use: Unknonw 2 - Amount (size/oz): varies 2 - Frequency: 3-4 times in a week 2 - Duration: ongoing 2 - Last Use / Amount: 09/20/20 2 - Method of Aquiring: illegal purchase                Sleep: Good  Appetite:  Good  Current Medications: Current Facility-Administered Medications  Medication Dose Route Frequency Provider Last Rate Last Admin   acetaminophen (TYLENOL) tablet 650 mg  650 mg Oral Q6H PRN Nwoko, Uchenna E, PA       albuterol (VENTOLIN HFA) 108 (90 Base) MCG/ACT inhaler 2 puff  2 puff Inhalation Q4H PRN Bartholomew Crews E, MD   2 puff at 09/21/20 2044   alum & mag hydroxide-simeth (MAALOX/MYLANTA) 200-200-20 MG/5ML suspension 30 mL  30 mL Oral Q4H PRN Nwoko, Uchenna E, PA       amLODipine (NORVASC) tablet 10 mg  10 mg Oral Daily Mason Jim, Chandell Attridge E, MD   10 mg at 09/22/20 1010   FLUoxetine (PROZAC) capsule 10 mg  10 mg Oral Daily Mason Jim, Erdem Naas E, MD   10 mg at 09/22/20 1010   gabapentin (NEURONTIN) capsule  100 mg  100 mg Oral TID Comer Locket, MD   100 mg at 09/22/20 1010   hydrOXYzine (ATARAX/VISTARIL) tablet 25 mg  25 mg Oral TID PRN Nwoko, Uchenna E, PA   25 mg at 09/21/20 1157   loperamide (IMODIUM) capsule 2-4 mg  2-4 mg Oral PRN Comer Locket, MD       LORazepam (ATIVAN) tablet 1 mg  1 mg Oral Q6H PRN Mason Jim, Arshdeep Bolger E, MD       magnesium hydroxide (MILK OF MAGNESIA) suspension 30 mL  30 mL Oral Daily PRN Nwoko, Uchenna E, PA       multivitamin with minerals tablet 1 tablet  1 tablet Oral Daily Mason Jim, Artavis Cowie E, MD   1 tablet at  09/22/20 1010   ondansetron (ZOFRAN-ODT) disintegrating tablet 4 mg  4 mg Oral Q6H PRN Mason Jim, Kamyra Schroeck E, MD       prazosin (MINIPRESS) capsule 1 mg  1 mg Oral QHS Mason Jim, Paije Goodhart E, MD   1 mg at 09/21/20 2045   QUEtiapine (SEROQUEL) tablet 100 mg  100 mg Oral QHS Mason Jim, Debrina Kizer E, MD   100 mg at 09/21/20 2045   thiamine tablet 100 mg  100 mg Oral Daily Mason Jim, Alece Koppel E, MD   100 mg at 09/22/20 1010   traZODone (DESYREL) tablet 50 mg  50 mg Oral QHS PRN Nwoko, Uchenna E, PA   50 mg at 09/21/20 2045    Lab Results:  Results for orders placed or performed during the hospital encounter of 09/20/20 (from the past 48 hour(s))  POC SARS Coronavirus 2 Ag     Status: None   Collection Time: 09/21/20 12:00 AM  Result Value Ref Range   SARSCOV2ONAVIRUS 2 AG NEGATIVE NEGATIVE    Comment: (NOTE) SARS-CoV-2 antigen NOT DETECTED.   Negative results are presumptive.  Negative results do not preclude SARS-CoV-2 infection and should not be used as the sole basis for treatment or other patient management decisions, including infection  control decisions, particularly in the presence of clinical signs and  symptoms consistent with COVID-19, or in those who have been in contact with the virus.  Negative results must be combined with clinical observations, patient history, and epidemiological information. The expected result is Negative.  Fact Sheet for Patients:  https://www.jennings-kim.com/  Fact Sheet for Healthcare Providers: https://alexander-rogers.biz/  This test is not yet approved or cleared by the [place] FDA and  has been authorized for detection and/or diagnosis of SARS-CoV-2 by FDA under an Emergency Use Authorization (EUA).  This EUA will remain in effect (meaning this test can be used) for the duration of  the COV ID-19 declaration under Section 564(b)(1) of the Act, 21 U.S.C. section 360bbb-3(b)(1), unless the authorization is terminated or revoked sooner.    Resp Panel by RT-PCR (Flu A&B, Covid)     Status: None   Collection Time: 09/21/20 12:10 AM  Result Value Ref Range   SARS Coronavirus 2 by RT PCR NEGATIVE NEGATIVE    Comment: (NOTE) SARS-CoV-2 target nucleic acids are NOT DETECTED.  The SARS-CoV-2 RNA is generally detectable in upper respiratory specimens during the acute phase of infection. The lowest concentration of SARS-CoV-2 viral copies this assay can detect is 138 copies/mL. A negative result does not preclude SARS-Cov-2 infection and should not be used as the sole basis for treatment or other patient management decisions. A negative result may occur with  improper specimen collection/handling, submission of specimen other than nasopharyngeal swab, presence of viral mutation(s) within the areas targeted by this assay, and inadequate number of viral copies(<138 copies/mL). A negative result must be combined with clinical observations, patient history, and epidemiological information. The expected result is Negative.  Fact Sheet for Patients:  BloggerCourse.com  Fact Sheet for Healthcare Providers:  SeriousBroker.it  This test is no t yet approved or cleared by the [place] FDA and  has been authorized for detection and/or diagnosis of SARS-CoV-2 by FDA under an Emergency Use Authorization (EUA). This EUA will remain  in  effect (meaning this test can be used) for the duration of the COVID-19 declaration under Section 564(b)(1) of the Act, 21 U.S.C.section 360bbb-3(b)(1), unless the authorization is terminated  or revoked sooner.       Influenza A by  PCR NEGATIVE NEGATIVE   Influenza B by PCR NEGATIVE NEGATIVE    Comment: (NOTE) The Xpert Xpress SARS-CoV-2/FLU/RSV plus assay is intended as an aid in the diagnosis of influenza from Nasopharyngeal swab specimens and should not be used as a sole basis for treatment. Nasal washings and aspirates are unacceptable for Xpert Xpress SARS-CoV-2/FLU/RSV testing.  Fact Sheet for Patients: BloggerCourse.com  Fact Sheet for Healthcare Providers: SeriousBroker.it  This test is not yet approved or cleared by the [place] FDA and has been authorized for detection and/or diagnosis of SARS-CoV-2 by FDA under an Emergency Use Authorization (EUA). This EUA will remain in effect (meaning this test can be used) for the duration of the COVID-19 declaration under Section 564(b)(1) of the Act, 21 U.S.C. section 360bbb-3(b)(1), unless the authorization is terminated or revoked.  Performed at [facility], [address] [place], [place] [address]   Ethanol     Status: None   Collection Time: 09/21/20  6:33 PM  Result Value Ref Range   Alcohol, Ethyl (B) <10 <10 mg/dL    Comment: (NOTE) Lowest detectable limit for serum alcohol is 10 mg/dL.  For medical purposes only. Performed at [facility], [address], [place], [place] [address]   Lipid panel     Status: None   Collection Time: 09/21/20  6:33 PM  Result Value Ref Range   Cholesterol 145 0 - 200 mg/dL   Triglycerides 324 <401 mg/dL   HDL 67 >02 mg/dL   Total CHOL/HDL Ratio 2.2 RATIO   VLDL 22 0 - 40 mg/dL   LDL Cholesterol 56 0 - 99 mg/dL    Comment:        Total Cholesterol/HDL:CHD Risk Coronary Heart  Disease Risk Table                     Men   Women  1/2 Average Risk   3.4   3.3  Average Risk       5.0   4.4  2 X Average Risk   9.6   7.1  3 X Average Risk  23.4   11.0        Use the calculated Patient Ratio above and the CHD Risk Table to determine the patient's CHD Risk.        ATP III CLASSIFICATION (LDL):  <100     mg/dL   Optimal  725-366  mg/dL   Near or Above                    Optimal  130-159  mg/dL   Borderline  440-347  mg/dL   High  >425     mg/dL   Very High Performed at [facility], [address], [place], [place] [address]   TSH     Status: None   Collection Time: 09/21/20  6:33 PM  Result Value Ref Range   TSH 1.111 0.350 - 4.500 uIU/mL    Comment: Performed by a 3rd Generation assay with a functional sensitivity of <=0.01 uIU/mL. Performed at [facility], [address], [place], [place] [address]   Resp Panel by RT-PCR (Flu A&B, Covid) Nasopharyngeal Swab     Status: None   Collection Time: 09/22/20  4:32 AM   Specimen: Nasopharyngeal Swab; Nasopharyngeal(NP) swabs in vial transport medium  Result Value Ref Range   SARS Coronavirus 2 by RT PCR NEGATIVE NEGATIVE    Comment: (NOTE) SARS-CoV-2 target  nucleic acids are NOT DETECTED.  The SARS-CoV-2 RNA is generally detectable in upper respiratory specimens during the acute phase of infection. The lowest concentration of SARS-CoV-2 viral copies this assay can detect is 138 copies/mL. A negative result does not preclude SARS-Cov-2 infection and should not be used as the sole basis for treatment or other patient management decisions. A negative result may occur with  improper specimen collection/handling, submission of specimen other than nasopharyngeal swab, presence of viral mutation(s) within the areas targeted by this assay, and inadequate number of viral copies(<138 copies/mL). A negative result must be combined with clinical observations, patient history, and  epidemiological information. The expected result is Negative.  Fact Sheet for Patients:  BloggerCourse.comhttps://www.fda.gov/media/152166/download  Fact Sheet for Healthcare Providers:  SeriousBroker.ithttps://www.fda.gov/media/152162/download  This test is no t yet approved or cleared by the [place] FDA and  has been authorized for detection and/or diagnosis of SARS-CoV-2 by FDA under an Emergency Use Authorization (EUA). This EUA will remain  in effect (meaning this test can be used) for the duration of the COVID-19 declaration under Section 564(b)(1) of the Act, 21 U.S.C.section 360bbb-3(b)(1), unless the authorization is terminated  or revoked sooner.       Influenza A by PCR NEGATIVE NEGATIVE   Influenza B by PCR NEGATIVE NEGATIVE    Comment: (NOTE) The Xpert Xpress SARS-CoV-2/FLU/RSV plus assay is intended as an aid in the diagnosis of influenza from Nasopharyngeal swab specimens and should not be used as a sole basis for treatment. Nasal washings and aspirates are unacceptable for Xpert Xpress SARS-CoV-2/FLU/RSV testing.  Fact Sheet for Patients: BloggerCourse.comhttps://www.fda.gov/media/152166/download  Fact Sheet for Healthcare Providers: SeriousBroker.ithttps://www.fda.gov/media/152162/download  This test is not yet approved or cleared by the [place] FDA and has been authorized for detection and/or diagnosis of SARS-CoV-2 by FDA under an Emergency Use Authorization (EUA). This EUA will remain in effect (meaning this test can be used) for the duration of the COVID-19 declaration under Section 564(b)(1) of the Act, 21 U.S.C. section 360bbb-3(b)(1), unless the authorization is terminated or revoked.  Performed at [facility], [address] [place], [place] [id number]     Blood Alcohol level:  Lab Results  Component Value Date   ETH <10 09/21/2020   ETH <10 07/11/2020    Metabolic Disorder Labs: Lab Results  Component Value Date   HGBA1C 5.5 07/09/2020   MPG 111.15 07/09/2020   MPG  111.15 01/24/2020   No results found for: PROLACTIN Lab Results  Component Value Date   CHOL 145 09/21/2020   TRIG 109 09/21/2020   HDL 67 09/21/2020   CHOLHDL 2.2 09/21/2020   VLDL 22 09/21/2020   LDLCALC 56 09/21/2020   LDLCALC 60 07/09/2020    Physical Findings: AIMS:  , ,  ,  ,    CIWA:  CIWA-Ar Total: 8 COWS:     Musculoskeletal: Strength & Muscle Tone: within normal limits Gait & Station: unable to assess. Patient laying in bed. Patient leans: N/A  Psychiatric Specialty Exam:  Presentation  General Appearance: Disheveled; actively withdrawing; shaking in bed Eye Contact: none Speech:abrupt, answers with short phrases, nonspontaneous Speech Volume:Increased  Handedness:Right   Mood and Affect  Mood:Dysphoric; Anxious; Irritable  Affect:Labile, irritable, hostile   Thought Process  Thought Processes:Concrete, linear  Descriptions of Associations:Intact Orientation: Unable to assess given agitation  Thought Content:Intact  History of Schizophrenia/Schizoaffective disorder:No  Hallucinations:Denies  Ideas of Reference:None  Suicidal Thoughts:Yes, unclear plan or intent and contracts for safety on the unit Homicidal Thoughts:Denied  today  Sensorium  Memory: Too agitated to assess Judgment: poor Insight: lacking  Executive Functions  Concentration:Fair  Attention Span:Fair  Recall:uncooperative for questioning  Fund of Knowledge:Fair  Language:Fair   Psychomotor Activity  Psychomotor Activity:restless, fidgety in bed  Assets  Assets: resilience, ACTT services  Sleep  Sleep: 6 hours  Physical Exam Vitals and nursing note reviewed.  Constitutional:      Appearance: She is obese.  HENT:     Head: Normocephalic and atraumatic.  Pulmonary:     Effort: Pulmonary effort is normal.  Neurological:     General: No focal deficit present.     Mental Status: She is alert.   Review of Systems  Respiratory:  Negative for shortness of  breath.   Cardiovascular:  Negative for chest pain.  Gastrointestinal:  Negative for abdominal pain, constipation, diarrhea, heartburn, nausea and vomiting.  Neurological:  Negative for headaches.  Blood pressure (!) 147/103, pulse 91, temperature 98.7 F (37.1 C), temperature source Oral, resp. rate 18, height 5\' 6"  (1.676 m), weight 81.6 kg, last menstrual period 06/19/2018, SpO2 98 %. Body mass index is 29.05 kg/m.   Treatment Plan Summary: Daily contact with patient to assess and evaluate symptoms and progress in treatment and Medication management  ASSESSMENT Patient is a 50y/o female with h/o MDD with psychotic features, PTSD, and polysubstance abuse who walked-in to [facility] on 09/20/20 for help with SI and HI. She is well known to this service from previous inpatient admissions.  Patient is currently on Hospital Day 2.  PLAN: Safety and Monitoring:             -- Voluntary admission to inpatient psychiatric unit for safety, stabilization and treatment             -- Daily contact with patient to assess and evaluate symptoms and progress in treatment             -- Patient's case to be discussed in multi-disciplinary team meeting             -- Observation Level : q15 minute checks             -- Vital signs:  q12 hours             -- Precautions: suicide, elopement, and assault  2. Psychiatric Diagnoses and Treatment:   MDD recurrent severe vs Substance-induced mood disorder PTSD Suicidal Ideation  Medication Noncompliance -Increase Gabapentin to 200 mg tid for mood instability and potential withdrawal symptoms -Increase Seroquel to 200 mg qhs for sleep and mood instability- anticipate upward titration  - EKG pending when patient will cooperate; A1c pending; Lipids WNL -Continue Prozac 10 mg qd for depression- titrating back up to home dose gradually as tolerated -Continue Prazosin 1 mg qd for PTSD -Continue Trazodone 50 mg qhs PRN for insomnia - SW to get collateral from ACTT  about discharge needs  Stimulant use d/o - cocaine type Alcohol use d/o - Has PRNs available for withdrawal symptoms -CIWA protocol with Ativan 1mg  for scores >10 (recent scores, 6,4,8) - Oral thiamine and MVI replacement - Discuss meaningful substance abuse treatment when she will engage more in discussion -Increase Gabapentin to 200 mg tid for mood instability and potential withdrawal symptoms - UDS pending; ETOH <10  3. Medical Problems  Hypertension -Amlodipine 10 mg, will monitor BP and HR - EKG pending  4. Discharge Planning:              -- Social work and  case management to assist with discharge planning and identification of hospital follow-up needs prior to discharge             -- Estimated LOS: 3-4 days             -- Discharge Concerns: Need to establish a safety plan; Medication compliance and effectiveness             -- Discharge Goals: Return home with outpatient referrals for mental health follow-up including medication management/psychotherapy     Park Pope, MD 09/22/2020, 11:01 AM
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
## 2020-09-22 NOTE — Progress Notes (Signed)
D: Patient has been in her room this shift. She refused to come to the med window for her medications. I informed her that she has a medication due at 1200 and she states, "I'll be up in a minute." She has remained isolative to her room. Patient is a high fall risk. Patient's covid test from this morning resulted negative. Reinforced the need for patient to get up today and ambulate.  A: Continue to monitor medication management and MD orders.  Safety checks completed every 15 minutes per protocol.  Offer support and encouragement as needed.  R: Patient remains isolative to room.     09/22/20 1200  Psych Admission Type (Psych Patients Only)  Admission Status Voluntary  Psychosocial Assessment  Patient Complaints Anxiety;Depression  Eye Contact Fair  Facial Expression Sad  Affect Anxious;Depressed  Speech Logical/coherent  Interaction Assertive  Motor Activity Unsteady  Appearance/Hygiene Unremarkable  Behavior Characteristics Unwilling to participate  Mood Depressed  Thought Process  Coherency WDL  Content WDL  Delusions None reported or observed  Perception WDL  Hallucination None reported or observed  Judgment Poor  Confusion None  Danger to Self  Current suicidal ideation? Passive  Self-Injurious Behavior No self-injurious ideation or behavior indicators observed or expressed   Agreement Not to Harm Self Yes  Description of Agreement verbal  Danger to Others  Danger to Others None reported or observed

## 2020-09-22 NOTE — Progress Notes (Signed)
At the beginning of the shift, Rachel Nolan was complaining of SOB and wheezing.  Inhaler given with some relief.  She continues to report passive SI and verbally agrees to not harm herself on the unit.  She denied HI or AVH.  She Requested medication for her cough and congestion.  Notified Nira Conn NP and new orders were written for Guaifenesin.  VSS,Temp 99.1 and O2 Sats 100% on room air.  She fell asleep before prn could be administered.  She was initially restless in bed but is now resting with her eyes closed and appears to be asleep.  Q 15 minute checks maintained for safety.   09/22/20 2032  Psych Admission Type (Psych Patients Only)  Admission Status Voluntary  Psychosocial Assessment  Patient Complaints Anxiety;Insomnia;Depression  Eye Contact Fair  Facial Expression Sad  Affect Anxious;Depressed  Speech Logical/coherent  Interaction Assertive  Motor Activity Unsteady  Appearance/Hygiene Unremarkable  Behavior Characteristics Cooperative;Unable to participate  Mood Depressed  Thought Process  Coherency WDL  Content WDL  Delusions None reported or observed  Perception WDL  Hallucination None reported or observed  Judgment Poor  Confusion None  Danger to Self  Current suicidal ideation? Passive  Self-Injurious Behavior No self-injurious ideation or behavior indicators observed or expressed   Agreement Not to Harm Self Yes  Description of Agreement verbal agreement to not harm self  Danger to Others  Danger to Others None reported or observed

## 2020-09-22 NOTE — Progress Notes (Signed)
Pt did not participate in goals group.  
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
## 2020-09-22 NOTE — [facility] Group Notes (Signed)
Topic:   Due to the acuity, complex discharge plans, and Covid-19 precautions, group was not held. Patient was provided therapeutic worksheets and asked to meet with CSW as needed. 

## 2020-09-22 NOTE — Progress Notes (Signed)
Recreation Therapy Notes  INPATIENT RECREATION THERAPY ASSESSMENT  Patient Details Name: Rachel Nolan MRN: [id number] DOB: 01/25/1970 Today's Date: 09/22/2020       Information Obtained From: Patient  Able to Participate in Assessment/Interview: Yes  Patient Presentation: Alert (Tearful; Anxious)  Reason for Admission (Per Patient): Suicidal Ideation, Other (Comments) (Depression)  Patient Stressors: Other (Comment) (Talking about the past)  Coping Skills:   Isolation, Self-Injury, TV, Music, Substance Abuse, Talk, Hot Bath/Shower  Leisure Interests (2+):  Individual - Other (Comment) ("be by myself")  Frequency of Recreation/Participation: Other (Comment) ("all the time")  Awareness of Community Resources:  No  Expressed Interest in [employer] Information: No  County of Residence:  [place]  Patient Main Form of Transportation: Other (Comment) (Get a ride)  Patient Strengths:  "I don't have no strengths"  Patient Identified Areas of Improvement:  "No"  Patient Goal for Hospitalization:  none  Current SI (including self-harm):  Yes (Rated a 10)  Current HI:  No  Current AVH: No  Staff Intervention Plan: Group Attendance, Collaborate with Interdisciplinary Treatment Team  Consent to Intern Participation: N/A    Caroll Rancher, LRT/CTRS  Caroll Rancher A 09/22/2020, 12:42 PM
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
## 2020-09-23 DIAGNOSIS — F1410 Cocaine abuse, uncomplicated: Secondary | ICD-10-CM | POA: Diagnosis not present

## 2020-09-23 DIAGNOSIS — J45909 Unspecified asthma, uncomplicated: Secondary | ICD-10-CM | POA: Diagnosis present

## 2020-09-23 DIAGNOSIS — F1994 Other psychoactive substance use, unspecified with psychoactive substance-induced mood disorder: Secondary | ICD-10-CM

## 2020-09-23 DIAGNOSIS — R45851 Suicidal ideations: Secondary | ICD-10-CM

## 2020-09-23 DIAGNOSIS — F1010 Alcohol abuse, uncomplicated: Secondary | ICD-10-CM

## 2020-09-23 DIAGNOSIS — I10 Essential (primary) hypertension: Secondary | ICD-10-CM

## 2020-09-23 DIAGNOSIS — R000 Tachycardia, unspecified: Secondary | ICD-10-CM | POA: Insufficient documentation

## 2020-09-23 DIAGNOSIS — M792 Neuralgia and neuritis, unspecified: Secondary | ICD-10-CM

## 2020-09-23 DIAGNOSIS — F333 Major depressive disorder, recurrent, severe with psychotic symptoms: Secondary | ICD-10-CM | POA: Diagnosis not present

## 2020-09-23 DIAGNOSIS — Z5900 Homelessness unspecified: Secondary | ICD-10-CM

## 2020-09-23 DIAGNOSIS — F4310 Post-traumatic stress disorder, unspecified: Secondary | ICD-10-CM

## 2020-09-23 LAB — BASIC METABOLIC PANEL
Anion gap: 7 (ref 5–15)
BUN: 13 mg/dL (ref 6–20)
CO2: 28 mmol/L (ref 22–32)
Calcium: 8.9 mg/dL (ref 8.9–10.3)
Chloride: 100 mmol/L (ref 98–111)
Creatinine, Ser: 0.54 mg/dL (ref 0.44–1.00)
GFR, Estimated: >60 mL/min (ref >60)
Glucose, Bld: 112 mg/dL — ABNORMAL HIGH (ref 70–99)
Potassium: 3.8 mmol/L (ref 3.5–5.1)
Sodium: 135 mmol/L (ref 135–145)

## 2020-09-23 LAB — HEMOGLOBIN A1C
Hgb A1c MFr Bld: 5.5 % (ref 4.8–5.6)
Mean Plasma Glucose: 111 mg/dL

## 2020-09-23 LAB — RESP PANEL BY RT-PCR (FLU A&B, COVID) ARPGX2
Influenza A by PCR: NEGATIVE
Influenza B by PCR: NEGATIVE
SARS Coronavirus 2 by RT PCR: NEGATIVE

## 2020-09-23 MED ORDER — GABAPENTIN 300 MG PO CAPS
300.0000 mg | ORAL_CAPSULE | Freq: Three times a day (TID) | ORAL | Status: DC
  Administered 2020-09-24 – 2020-09-27 (×10): 300 mg via ORAL
  Filled 2020-09-23: qty 21
  Filled 2020-09-23 (×3): qty 1
  Filled 2020-09-23: qty 21
  Filled 2020-09-23 (×9): qty 1
  Filled 2020-09-23: qty 21
  Filled 2020-09-23: qty 1

## 2020-09-23 MED ORDER — FLUOXETINE HCL 10 MG PO CAPS
10.0000 mg | ORAL_CAPSULE | Freq: Once | ORAL | Status: AC
  Administered 2020-09-23: 10 mg via ORAL
  Filled 2020-09-23: qty 1

## 2020-09-23 MED ORDER — FLUOXETINE HCL 20 MG PO CAPS
20.0000 mg | ORAL_CAPSULE | Freq: Every day | ORAL | Status: DC
  Administered 2020-09-23 – 2020-09-27 (×5): 20 mg via ORAL
  Filled 2020-09-23 (×7): qty 1
  Filled 2020-09-23: qty 7
  Filled 2020-09-23: qty 1

## 2020-09-23 NOTE — Progress Notes (Signed)
Pt did not participate in any discussion as she was asleep and agitated as well earlier in the morning. Pt was given a daily packet.

## 2020-09-23 NOTE — Progress Notes (Signed)
Recreation Therapy Notes  Date: 9.6.22 Time: 0930 Location: [address]  Group Topic: Anxiety  Goal Area(s) Addresses:  Patient will identify what triggers anxiety. Patient will identify symptoms of anxiety. Patient will identify coping skills for anxiety.  Intervention: Worksheet  Activity:  LRT met with each patient 1:1 to discuss how they are affected by anxiety and the coping skills used when dealing anxiety.  Education: Radiographer, therapeutic, Dentist.   Education Outcome: Acknowledges understanding/In group clarification offered/Needs additional education.   Clinical Observations/Feedback: Pt did not participate in 1:1 discussion.    Victorino Sparrow, LRT/CTRS    Victorino Sparrow A 09/23/2020 12:04 PM

## 2020-09-23 NOTE — Progress Notes (Signed)
DR Viviano Simas REQUESTED THAT EKG BE COMPLETED ON Rachel Nolan TONIGHT OR FIRST [facility]   Wednesday   MORNING.

## 2020-09-23 NOTE — Progress Notes (Signed)
[facility] MD Progress Note  09/23/2020 4:12 PM Rachel Nolan  MRN:  [id number] Subjective:  Patient is a 49y/o female with h/o MDD with psychotic features, PTSD, and polysubstance abuse who walked-in to [facility] on 09/20/20 for help with SI and HI. She is well known to this service from previous inpatient admissions.  Patient is currently on Hospital Day 3.  Chart Review, 24 hr Events: The patient's chart was reviewed and nursing notes were reviewed. The patient's case was discussed in multidisciplinary team meeting.  Per MAR: - Patient is compliant with scheduled meds. - PRNs: Albuterol 19:47 on 9/5 and Trazodone 50 mg 20:32 9/5 Per RN notes, pt continued to be labile with active SI but able to contract for safety Patient slept 6.5 hours last night  Information Obtained Today During Patient Interview: The patient was seen and evaluated on the unit with Dr. Viviano Simas. Again today, patient extremely agitated and shaking in bed. Patient states she is not doing good, and "I don't have COVID; I've already been tested 3 times that came back negative."  She coughs vigourously multiple times during the interview, and spits dark/black sputum into a cup. Patient states she has active SI and HI toward daughter but unable to specify a plan. She is upset that her daughter told her that she wishes somebody would hurt the patient after lying to her for 34 years about her paternity. Patient insists her shaking and lability has nothing to do with her use of crack/cocaine and insists that "I can handle that". Patient is able to contract for safety while in hospital and states she has been eating and sleeping appropriately. Patient denies AVH, ideas of reference, or first rank symptoms although patient grew largely uncooperative near the end of the assessment.   Principal Problem: MDD (major depressive disorder), recurrent, severe, with psychosis (HCC) Diagnosis: Principal Problem:   MDD (major depressive disorder), recurrent,  severe, with psychosis (HCC) Active Problems:   Post traumatic stress disorder (PTSD)   Cocaine use disorder (HCC)   Substance induced mood disorder (HCC)   Suicidal ideation   HTN (hypertension)   Neuropathic pain   Alcohol abuse   Homeless   Tachycardia   Asthma  Total Time spent with patient:  I personally spent 35 minutes on the unit in direct patient care. The direct patient care time included face-to-face time with the patient, reviewing the patient's chart, communicating with other professionals, and coordinating care. Greater than 50% of this time was spent in counseling or coordinating care with the patient regarding goals of hospitalization, psycho-education, and discharge planning needs.   Past Psychiatric History:  PTSD Major depressive disorder with psychotic features Past history of polysubstance abuse  Past Medical History:  Past Medical History:  Diagnosis Date   Anxiety    Asthma    Depression    Hypertension    Scoliosis     Past Surgical History:  Procedure Laterality Date   BACK SURGERY     ECTOPIC PREGNANCY SURGERY     Family History:  Family History  Problem Relation Age of Onset   Hypertension Mother    Hypertension Father    Family Psychiatric  History: Father and sister - substance abuse Social History:  Social History   Substance and Sexual Activity  Alcohol Use Yes   Comment: one 40 oz per month     Social History   Substance and Sexual Activity  Drug Use Yes   Types: Marijuana, Cocaine, "Crack" cocaine    Social  History   Socioeconomic History   Marital status: Widowed    Spouse name: Not on file   Number of children: Not on file   Years of education: Not on file   Highest education level: Not on file  Occupational History   Not on file  Tobacco Use   Smoking status: Every Day    Packs/day: 1.00    Types: Cigarettes   Smokeless tobacco: Never  Vaping Use   Vaping Use: Never used  Substance and Sexual Activity    Alcohol use: Yes    Comment: one 40 oz per month   Drug use: Yes    Types: Marijuana, Cocaine, "Crack" cocaine   Sexual activity: Not Currently  Other Topics Concern   Not on file  Social History Narrative   Pt is homeless, no fixed address; not followed by an outpatient psychiatrist   Social Determinants of Health   Financial Resource Strain: Not on file  Food Insecurity: Not on file  Transportation Needs: Not on file  Physical Activity: Not on file  Stress: Not on file  Social Connections: Not on file   Additional Social History:    Pain Medications: See previous d/c summary from [facility] in June '22. Prescriptions: See previous d/c summary from [facility] in June '22.  Pt has been using more than what is prescribed. Over the Counter: See previous d/c summary from [facility] in June '22 History of alcohol / drug use?: Yes Longest period of sobriety (when/how long): 3 months Name of Substance 1: ETOH 1 - Age of First Use: unknown 1 - Amount (size/oz): Unknown 1 - Frequency: 4-5 times ina  week 1 - Duration: ongoing 1 - Last Use / Amount: 09/03 Two beers 1 - Method of Aquiring: purchase 1- Route of Use: orally Name of Substance 2: Marijuana 2 - Age of First Use: Unknonw 2 - Amount (size/oz): varies 2 - Frequency: 3-4 times in a week 2 - Duration: ongoing 2 - Last Use / Amount: 09/20/20 2 - Method of Aquiring: illegal purchase      Sleep: Good  Appetite:  Good  Current Medications: Current Facility-Administered Medications  Medication Dose Route Frequency Provider Last Rate Last Admin   acetaminophen (TYLENOL) tablet 650 mg  650 mg Oral Q6H PRN Nwoko, Uchenna E, PA   650 mg at 09/23/20 0922   albuterol (VENTOLIN HFA) 108 (90 Base) MCG/ACT inhaler 2 puff  2 puff Inhalation Q4H PRN Mason Jim, Amy E, MD   2 puff at 09/23/20 1257   alum & mag hydroxide-simeth (MAALOX/MYLANTA) 200-200-20 MG/5ML suspension 30 mL  30 mL Oral Q4H PRN Nwoko, Uchenna E, PA       amLODipine (NORVASC) tablet 10  mg  10 mg Oral Daily Mason Jim, Amy E, MD   10 mg at 09/23/20 0837   FLUoxetine (PROZAC) capsule 10 mg  10 mg Oral Once Mariel Craft, MD       FLUoxetine (PROZAC) capsule 20 mg  20 mg Oral Daily Mariel Craft, MD       gabapentin (NEURONTIN) capsule 200 mg  200 mg Oral TID Park Pope, MD   200 mg at 09/23/20 1257   guaiFENesin (MUCINEX) 12 hr tablet 1,200 mg  1,200 mg Oral BID PRN Nira Conn A, NP   1,200 mg at 09/23/20 0836   hydrOXYzine (ATARAX/VISTARIL) tablet 25 mg  25 mg Oral TID PRN Nwoko, Uchenna E, PA   25 mg at 09/23/20 0923   loperamide (IMODIUM) capsule 2-4 mg  2-4 mg Oral PRN Comer Locket, MD       LORazepam (ATIVAN) tablet 1 mg  1 mg Oral Q6H PRN Mason Jim, Amy E, MD       magnesium hydroxide (MILK OF MAGNESIA) suspension 30 mL  30 mL Oral Daily PRN Nwoko, Uchenna E, PA       multivitamin with minerals tablet 1 tablet  1 tablet Oral Daily Mason Jim, Amy E, MD   1 tablet at 09/23/20 0836   ondansetron (ZOFRAN-ODT) disintegrating tablet 4 mg  4 mg Oral Q6H PRN Mason Jim, Amy E, MD       prazosin (MINIPRESS) capsule 1 mg  1 mg Oral QHS Mason Jim, Amy E, MD   1 mg at 09/22/20 2032   QUEtiapine (SEROQUEL) tablet 200 mg  200 mg Oral Seabron Spates, MD   200 mg at 09/22/20 2032   thiamine tablet 100 mg  100 mg Oral Daily Bartholomew Crews E, MD   100 mg at 09/23/20 4332   traZODone (DESYREL) tablet 50 mg  50 mg Oral QHS PRN Karel Jarvis E, PA   50 mg at 09/22/20 2032    Lab Results:  Results for orders placed or performed during the hospital encounter of 09/20/20 (from the past 48 hour(s))  Hemoglobin A1c     Status: None   Collection Time: 09/21/20  6:33 PM  Result Value Ref Range   Hgb A1c MFr Bld 5.5 4.8 - 5.6 %    Comment: (NOTE)         Prediabetes: 5.7 - 6.4         Diabetes: >6.4         Glycemic control for adults with diabetes: <7.0    Mean Plasma Glucose 111 mg/dL    Comment: (NOTE) Performed At: [facility] [address] [place], [place]  [id number] Jolene Schimke MD AY:[id number]   Ethanol     Status: None   Collection Time: 09/21/20  6:33 PM  Result Value Ref Range   Alcohol, Ethyl (B) <10 <10 mg/dL    Comment: (NOTE) Lowest detectable limit for serum alcohol is 10 mg/dL.  For medical purposes only. Performed at [facility], [address], [place], [place] [address]   Lipid panel     Status: None   Collection Time: 09/21/20  6:33 PM  Result Value Ref Range   Cholesterol 145 0 - 200 mg/dL   Triglycerides 220 <254 mg/dL   HDL 67 >27 mg/dL   Total CHOL/HDL Ratio 2.2 RATIO   VLDL 22 0 - 40 mg/dL   LDL Cholesterol 56 0 - 99 mg/dL    Comment:        Total Cholesterol/HDL:CHD Risk Coronary Heart Disease Risk Table                     Men   Women  1/2 Average Risk   3.4   3.3  Average Risk       5.0   4.4  2 X Average Risk   9.6   7.1  3 X Average Risk  23.4   11.0        Use the calculated Patient Ratio above and the CHD Risk Table to determine the patient's CHD Risk.        ATP III CLASSIFICATION (LDL):  <100     mg/dL   Optimal  062-376  mg/dL   Near or Above  Optimal  130-159  mg/dL   Borderline  409-811  mg/dL   High  >914     mg/dL   Very High Performed at [facility], [address], [place], [place] [address]   TSH     Status: None   Collection Time: 09/21/20  6:33 PM  Result Value Ref Range   TSH 1.111 0.350 - 4.500 uIU/mL    Comment: Performed by a 3rd Generation assay with a functional sensitivity of <=0.01 uIU/mL. Performed at [facility], [address] [place], [place] [address]   Resp Panel by RT-PCR (Flu A&B, Covid) Nasopharyngeal Swab     Status: None   Collection Time: 09/22/20  4:32 AM   Specimen: Nasopharyngeal Swab; Nasopharyngeal(NP) swabs in vial transport medium  Result Value Ref Range   SARS Coronavirus 2 by RT PCR NEGATIVE NEGATIVE    Comment: (NOTE) SARS-CoV-2 target nucleic acids are NOT  DETECTED.  The SARS-CoV-2 RNA is generally detectable in upper respiratory specimens during the acute phase of infection. The lowest concentration of SARS-CoV-2 viral copies this assay can detect is 138 copies/mL. A negative result does not preclude SARS-Cov-2 infection and should not be used as the sole basis for treatment or other patient management decisions. A negative result may occur with  improper specimen collection/handling, submission of specimen other than nasopharyngeal swab, presence of viral mutation(s) within the areas targeted by this assay, and inadequate number of viral copies(<138 copies/mL). A negative result must be combined with clinical observations, patient history, and epidemiological information. The expected result is Negative.  Fact Sheet for Patients:  BloggerCourse.com  Fact Sheet for Healthcare Providers:  SeriousBroker.it  This test is no t yet approved or cleared by the [place] FDA and  has been authorized for detection and/or diagnosis of SARS-CoV-2 by FDA under an Emergency Use Authorization (EUA). This EUA will remain  in effect (meaning this test can be used) for the duration of the COVID-19 declaration under Section 564(b)(1) of the Act, 21 U.S.C.section 360bbb-3(b)(1), unless the authorization is terminated  or revoked sooner.       Influenza A by PCR NEGATIVE NEGATIVE   Influenza B by PCR NEGATIVE NEGATIVE    Comment: (NOTE) The Xpert Xpress SARS-CoV-2/FLU/RSV plus assay is intended as an aid in the diagnosis of influenza from Nasopharyngeal swab specimens and should not be used as a sole basis for treatment. Nasal washings and aspirates are unacceptable for Xpert Xpress SARS-CoV-2/FLU/RSV testing.  Fact Sheet for Patients: BloggerCourse.com  Fact Sheet for Healthcare Providers: SeriousBroker.it  This test is not yet approved or  cleared by the [place] FDA and has been authorized for detection and/or diagnosis of SARS-CoV-2 by FDA under an Emergency Use Authorization (EUA). This EUA will remain in effect (meaning this test can be used) for the duration of the COVID-19 declaration under Section 564(b)(1) of the Act, 21 U.S.C. section 360bbb-3(b)(1), unless the authorization is terminated or revoked.  Performed at [facility], [address] [place], [place] [address]   Resp Panel by RT-PCR (Flu A&B, Covid) Nasopharyngeal Swab     Status: None   Collection Time: 09/23/20 12:44 PM   Specimen: Nasopharyngeal Swab; Nasopharyngeal(NP) swabs in vial transport medium  Result Value Ref Range   SARS Coronavirus 2 by RT PCR NEGATIVE NEGATIVE    Comment: (NOTE) SARS-CoV-2 target nucleic acids are NOT DETECTED.  The SARS-CoV-2 RNA is generally detectable in upper respiratory specimens during the acute phase of  infection. The lowest concentration of SARS-CoV-2 viral copies this assay can detect is 138 copies/mL. A negative result does not preclude SARS-Cov-2 infection and should not be used as the sole basis for treatment or other patient management decisions. A negative result may occur with  improper specimen collection/handling, submission of specimen other than nasopharyngeal swab, presence of viral mutation(s) within the areas targeted by this assay, and inadequate number of viral copies(<138 copies/mL). A negative result must be combined with clinical observations, patient history, and epidemiological information. The expected result is Negative.  Fact Sheet for Patients:  BloggerCourse.com  Fact Sheet for Healthcare Providers:  SeriousBroker.it  This test is no t yet approved or cleared by the [place] FDA and  has been authorized for detection and/or diagnosis of SARS-CoV-2 by FDA under an Emergency Use Authorization (EUA). This  EUA will remain  in effect (meaning this test can be used) for the duration of the COVID-19 declaration under Section 564(b)(1) of the Act, 21 U.S.C.section 360bbb-3(b)(1), unless the authorization is terminated  or revoked sooner.       Influenza A by PCR NEGATIVE NEGATIVE   Influenza B by PCR NEGATIVE NEGATIVE    Comment: (NOTE) The Xpert Xpress SARS-CoV-2/FLU/RSV plus assay is intended as an aid in the diagnosis of influenza from Nasopharyngeal swab specimens and should not be used as a sole basis for treatment. Nasal washings and aspirates are unacceptable for Xpert Xpress SARS-CoV-2/FLU/RSV testing.  Fact Sheet for Patients: BloggerCourse.com  Fact Sheet for Healthcare Providers: SeriousBroker.it  This test is not yet approved or cleared by the [place] FDA and has been authorized for detection and/or diagnosis of SARS-CoV-2 by FDA under an Emergency Use Authorization (EUA). This EUA will remain in effect (meaning this test can be used) for the duration of the COVID-19 declaration under Section 564(b)(1) of the Act, 21 U.S.C. section 360bbb-3(b)(1), unless the authorization is terminated or revoked.  Performed at [facility], [address], [place], [place] [address]     Blood Alcohol level:  Lab Results  Component Value Date   ETH <10 09/21/2020   ETH <10 07/11/2020    Metabolic Disorder Labs: Lab Results  Component Value Date   HGBA1C 5.5 09/21/2020   MPG 111 09/21/2020   MPG 111.15 07/09/2020   No results found for: PROLACTIN Lab Results  Component Value Date   CHOL 145 09/21/2020   TRIG 109 09/21/2020   HDL 67 09/21/2020   CHOLHDL 2.2 09/21/2020   VLDL 22 09/21/2020   LDLCALC 56 09/21/2020   LDLCALC 60 07/09/2020    Physical Findings: CIWA:  CIWA-Ar Total: 0   Musculoskeletal: Strength & Muscle Tone: within normal limits Gait & Station: unable to assess. Patient  laying in bed. Patient leans: N/A  Psychiatric Specialty Exam:  Presentation  General Appearance: Disheveled; actively withdrawing; shaking in bed Eye Contact: minimal Speech:abrupt, answers with short phrases, nonspontaneous Speech Volume:Increased  Handedness:Right   Mood and Affect  Mood:Dysphoric; Irritable  Affect:Labile, irritable, hostile   Thought Process  Thought Processes:Concrete, linear  Descriptions of Associations:Intact Orientation: Unable to assess given agitation  Thought Content:Intact  History of Schizophrenia/Schizoaffective disorder:No  Hallucinations:Denies  Ideas of Reference:None  Suicidal Thoughts:Yes, unclear plan or intent and contracts for safety on the unit Homicidal Thoughts:Yes, with intent, but denies plan  Sensorium  Memory: Too agitated to assess Judgment: poor Insight: lacking  Executive Functions  Concentration:Fair  Attention Span:Fair  Recall:uncooperative for questioning  [employer] of Knowledge:Fair  Language:Fair  Psychomotor Activity  Psychomotor Activity:restless, fidgety in bed  Assets  Assets: resilience, ACTT services  Sleep  Sleep: 6.5 hours  Physical Exam Vitals and nursing note reviewed.  Constitutional:      Appearance: She is obese.  HENT:     Head: Normocephalic and atraumatic.  Pulmonary:     Effort: Pulmonary effort is normal.  Neurological:     General: No focal deficit present.     Mental Status: She is alert.   Review of Systems  Constitutional:  Negative for chills and fever.  Respiratory:  Positive for cough and sputum production. Negative for shortness of breath.   Cardiovascular:  Negative for chest pain.  Gastrointestinal:  Negative for abdominal pain, constipation, diarrhea, heartburn, nausea and vomiting.  Neurological:  Negative for headaches.  Blood pressure (!) 141/90, pulse 65, temperature 98.4 F (36.9 C), temperature source Oral, resp. rate 20, height 5\' 6"  (1.676 m),  weight 81.6 kg, last menstrual period 06/19/2018, SpO2 100 %. Body mass index is 29.05 kg/m.   Treatment Plan Summary: Daily contact with patient to assess and evaluate symptoms and progress in treatment and Medication management  ASSESSMENT Patient is a 49y/o female with h/o MDD with psychotic features, PTSD, and polysubstance abuse who walked-in to [facility] on 09/20/20 for help with SI and HI. She is well known to this service from previous inpatient admissions.  Patient is currently on Hospital Day 3.  PLAN: Safety and Monitoring:             -- Voluntary admission to inpatient psychiatric unit for safety, stabilization and treatment             -- Daily contact with patient to assess and evaluate symptoms and progress in treatment             -- Patient's case to be discussed in multi-disciplinary team meeting             -- Observation Level : q15 minute checks             -- Vital signs:  q12 hours             -- Precautions: suicide, elopement, and assault  2. Psychiatric Diagnoses and Treatment:   MDD recurrent severe vs Substance-induced mood disorder PTSD Suicidal Ideation  Medication Noncompliance -Increase Gabapentin to 300 mg tid for mood instability and potential withdrawal symptoms on 9/7. -Continue Seroquel 200 mg qhs for sleep and mood instability- anticipate upward titration  - EKG Qtc 483; A1c 5.5%; Lipids WNL  -BMP to assess for hypokalemia this evening; EKG to be repeated 9/7 AM. -Increase Prozac to 20 mg qd for depression- titrating back up to home dose gradually as tolerated -Continue Prazosin 1 mg qd for PTSD -Continue Trazodone 50 mg qhs PRN for insomnia - SW to get collateral from ACTT about discharge needs  Stimulant use d/o - cocaine type Alcohol use d/o - Has PRNs available for withdrawal symptoms -CIWA protocol with Ativan 1mg  for scores >10 (recent scores, 6,4,8) - Oral thiamine and MVI replacement - Discuss meaningful substance abuse treatment when she  will engage more in discussion -Increase Gabapentin to 300 mg tid for mood instability and potential withdrawal symptoms on 9/7 - UDS pending; ETOH <10  3. Medical Problems  Hypertension -Amlodipine 10 mg, will monitor BP and HR   4. Discharge Planning:              -- Social work and case management to assist with discharge  planning and identification of hospital follow-up needs prior to discharge             -- Estimated LOS: 3-4 days             -- Discharge Concerns: Need to establish a safety plan; Medication compliance and effectiveness             -- Discharge Goals: Return home with outpatient referrals for mental health follow-up including medication management/psychotherapy     Lamar Sprinklesourtney Jullia Mulligan, MD 09/23/2020, 4:12 PM

## 2020-09-23 NOTE — Progress Notes (Signed)
Pt observed in bed majority of this shift. Irritable and demanding on interactions. Reports poor sleep last night with fair appetite, low energy and poor concentration level. Denies HI and AVH. Endorsed SI but verbally contracts for safety. Pt received PRN Tylenol 650 mg PO and Albuterol inhaler twice this shift for complain of back pain 8/10 and wheezes / SOB. Noted with productive cough, tarred colored sputum. Support and encouragement offered to pt. All medications given with verbal education. Q 15 minutes safety checks maintained without self harm gestures thus far. Urine sample obtained, fluids encouraged and offered.   Pt tolerates meals and fluids well. Reports relief from anxiety and pain when reassessed "I'm fine now". Pt did not engaged` in 1:1 group sessions this shift despite multiple prompts "I'm tired, I just want to sleep, can y'all let me be right now".

## 2020-09-24 DIAGNOSIS — J209 Acute bronchitis, unspecified: Secondary | ICD-10-CM | POA: Diagnosis present

## 2020-09-24 DIAGNOSIS — J45901 Unspecified asthma with (acute) exacerbation: Secondary | ICD-10-CM | POA: Diagnosis present

## 2020-09-24 DIAGNOSIS — Z720 Tobacco use: Secondary | ICD-10-CM | POA: Diagnosis present

## 2020-09-24 DIAGNOSIS — F1010 Alcohol abuse, uncomplicated: Secondary | ICD-10-CM | POA: Diagnosis not present

## 2020-09-24 DIAGNOSIS — F1410 Cocaine abuse, uncomplicated: Secondary | ICD-10-CM | POA: Diagnosis not present

## 2020-09-24 DIAGNOSIS — F333 Major depressive disorder, recurrent, severe with psychotic symptoms: Secondary | ICD-10-CM | POA: Diagnosis not present

## 2020-09-24 DIAGNOSIS — J45909 Unspecified asthma, uncomplicated: Secondary | ICD-10-CM | POA: Diagnosis not present

## 2020-09-24 DIAGNOSIS — R634 Abnormal weight loss: Secondary | ICD-10-CM | POA: Diagnosis present

## 2020-09-24 DIAGNOSIS — R058 Other specified cough: Secondary | ICD-10-CM | POA: Diagnosis present

## 2020-09-24 LAB — RESP PANEL BY RT-PCR (FLU A&B, COVID) ARPGX2
Influenza A by PCR: NEGATIVE
Influenza B by PCR: NEGATIVE
SARS Coronavirus 2 by RT PCR: NEGATIVE

## 2020-09-24 LAB — RAPID URINE DRUG SCREEN, HOSP PERFORMED
Amphetamines: NOT DETECTED
Barbiturates: NOT DETECTED
Benzodiazepines: NOT DETECTED
Cocaine: POSITIVE — AB
Opiates: NOT DETECTED
Tetrahydrocannabinol: NOT DETECTED

## 2020-09-24 LAB — PREGNANCY, URINE: Preg Test, Ur: NEGATIVE

## 2020-09-24 MED ORDER — NICOTINE 14 MG/24HR TD PT24
14.0000 mg | MEDICATED_PATCH | Freq: Every day | TRANSDERMAL | Status: DC
  Administered 2020-09-24 – 2020-09-27 (×3): 14 mg via TRANSDERMAL
  Filled 2020-09-24 (×7): qty 1

## 2020-09-24 MED ORDER — PREDNISONE 20 MG PO TABS
40.0000 mg | ORAL_TABLET | Freq: Every day | ORAL | Status: DC
  Administered 2020-09-24 – 2020-09-27 (×4): 40 mg via ORAL
  Filled 2020-09-24 (×6): qty 2

## 2020-09-24 MED ORDER — DOXYCYCLINE HYCLATE 100 MG PO TABS
100.0000 mg | ORAL_TABLET | Freq: Two times a day (BID) | ORAL | Status: DC
  Administered 2020-09-24 – 2020-09-27 (×7): 100 mg via ORAL
  Filled 2020-09-24 (×12): qty 1

## 2020-09-24 MED ORDER — IPRATROPIUM-ALBUTEROL 0.5-2.5 (3) MG/3ML IN SOLN
3.0000 mL | Freq: Four times a day (QID) | RESPIRATORY_TRACT | Status: DC
  Administered 2020-09-24 – 2020-09-26 (×7): 3 mL via RESPIRATORY_TRACT
  Filled 2020-09-24 (×21): qty 3

## 2020-09-24 MED ORDER — METOPROLOL SUCCINATE ER 25 MG PO TB24
25.0000 mg | ORAL_TABLET | Freq: Every day | ORAL | Status: DC
  Administered 2020-09-24 – 2020-09-27 (×4): 25 mg via ORAL
  Filled 2020-09-24 (×5): qty 1
  Filled 2020-09-24: qty 7
  Filled 2020-09-24: qty 1

## 2020-09-24 MED ORDER — DM-GUAIFENESIN ER 30-600 MG PO TB12
1.0000 | ORAL_TABLET | Freq: Two times a day (BID) | ORAL | Status: DC
  Administered 2020-09-24 – 2020-09-27 (×6): 1 via ORAL
  Filled 2020-09-24 (×10): qty 1

## 2020-09-24 NOTE — Group Note (Signed)
Recreation Therapy Group Note   Group Topic:Self-Esteem  Group Date: 09/24/2020 Start Time: 1000 End Time: 1040 Facilitators: Caroll Rancher, LRT/CTRS Location:  [address]  Goal Area(s) Addresses:  Patient will appropriately identify what self esteem is.  Patient will create a shield of armor describing themselves.  Patient will successfully identify positive attributes about themselves.  Patient will acknowledge benefit of improved self-esteem.  Patient will follow instructions on 1st prompt.   Intervention / Activity: Self-Esteem Shield. Patient engaged in 1:1 session LRT that focused on self esteem. Patient identified what self esteem is.  LRT gave handout to patient of a blank shield and an explanation of what was to go in each quadrant. Patient was asked to create their own shield to show off their unique attributes, four quadrants reflected the following:   The Upper Left quadrant- two people or things you value. The Upper Right quadrant- two lessons you have learned thus far in life. The Lower Left quadrant- three qualities that make you unique. The Lower Right quadrant- one goal you want to work towards.    Patients were provided sheets with the shield printed on them and materials to complete the assignment.   Education: Self esteem, Communication, Positive self-talk, Discharge Planning    Affect/Mood: N/A   Participation Level: Did not participate   Participation Quality: N/A   Behavior: N/A   Speech/Thought Process: N/A   Insight: N/A   Judgement: N/A   Modes of Intervention: N/A   Patient Response to Interventions:  N/A   Education Outcome:  N/A   Clinical Observations/Individualized Feedback:  Pt did not participate in activity.   Plan: Continue to engage patient in RT group sessions 2-3x/week.   Caroll Rancher, LRT/CTRS 09/24/2020 2:28 PM

## 2020-09-24 NOTE — Progress Notes (Signed)
Pt denies SI, HI, AVH and pain. Verbally contracts for safety this shift. Presents less irritable and more interactive with staff and peers this evening when OOB for medications. Tolerated all medications, nebulizing treatment well. Received PRNS Vistaril, Tylenol and Albuterol inhaler as ordered for anxiety 7/10, lower back pain 7/10 and SOB (See EMAR). Reports relief when reassessed. Pt seen by PT who provided pt with walker. Pt continues to have productive cough with decrease sputum production this shift and improvement in from tarred black to gray color.  Verbal education done by Clinical research associate related to use and safety in milieu. All medications given as ordered with with verbal education. Q 15 minutes safety checks maintained without incident. Emotional support and encouragement offered to pt this shift. Pt safe on unit. Denies concerns at this time.

## 2020-09-24 NOTE — Progress Notes (Signed)
   09/24/20 2030  Psych Admission Type (Psych Patients Only)  Admission Status Voluntary  Psychosocial Assessment  Patient Complaints None  Eye Contact Brief  Facial Expression Sad  Affect Anxious;Depressed  Speech Logical/coherent  Interaction Assertive  Motor Activity Unsteady  Appearance/Hygiene Unremarkable  Behavior Characteristics Cooperative  Mood Depressed;Anxious;Pleasant  Thought Process  Coherency WDL  Content WDL  Delusions None reported or observed  Perception WDL  Hallucination None reported or observed  Judgment Poor  Confusion None  Danger to Self  Current suicidal ideation? Denies  Danger to Others  Danger to Others None reported or observed   Pt seen sitting by nurse's station. Denies SI, HI, AVH. Rates pain 7/10 as chronic lower back. Pt says she has her back brace. Pt is in medical bed but says it doesn't help. Not time for another dose of pain medicine. Pt has a walker to use because she says she gets short of breath just walking up the hallway. "I used to have one of those walkers you could sit on but my sister got rid of it. I also had a machine for my breathing treatments. I had three of them but my sister got rid of it all." Pt asking about assistance getting another machine for her breathing treatments. Pt diastolic BP less than 100 this evening. Pt started on Prazosin. Pulse slightly elevated at 102. Pt pleasant and cooperative. Forwards little.

## 2020-09-24 NOTE — Assessment & Plan Note (Signed)
Start 5 day course of prednisone 40 mg. duonebs QID(while awake) for 5 days. Pt will need prn albuterol inhaler at discharge. I do not think nebulizer machine is reasonable since pt is homeless.

## 2020-09-24 NOTE — Progress Notes (Signed)
Occupational Therapy Treatment Patient Details Name: Rachel Nolan MRN: [id number] DOB: September 21, 1970 Today's Date: 09/24/2020    History of present illness [name] is a 50 y/o female with PMHx of MDD, PTSD, worsening mood, SI and HI, and medical hx of HTN, neuropathic pain, scoliosis and back surgery. Pt currently inpatient at [facility] for psychiatric hospitalization; OT ordered to address functional mobility and provide patient with a RW and back brace.   OT comments  [name] was seen on the unit for OT evaluation to assess functional mobility and ADL/iADL performance. Prior to admission, pt reports being independent and lives with a friend in her apartment. Pt requesting use of rolling walker, as she uses one at home, and requesting back brace for pain. On the unit, pt able to ambulate up and down her room with use of RW; no LOB observed. OT contacted [facility]o tech at [phone] and will come over this afternoon or tomorrow morning to provide pt with back brace. No acute OT needs identified and will sign off.       Follow Up Recommendations  No OT follow up    Equipment Recommendations  3 in 1 bedside commode    Recommendations for Other Services      Precautions / Restrictions Precautions Precautions: Fall Required Braces or Orthoses: Other Brace Other Brace: Pt hx scoliosis and back surgery - [employer] contacted for back brace to address pain Restrictions Weight Bearing Restrictions: No       Mobility Bed Mobility Overal bed mobility: Independent                  Transfers Overall transfer level: Independent Equipment used: Rolling walker (2 wheeled)                  Balance                                           ADL either performed or assessed with clinical judgement   ADL Overall ADL's : Modified independent Eating/Feeding: Independent   Grooming: Independent   Upper Body Bathing: Modified independent;Sitting;With adaptive  equipment   Lower Body Bathing: Modified independent;With adaptive equipment;Sit to/from stand   Upper Body Dressing : Independent;With adaptive equipment   Lower Body Dressing: Independent;Sit to/from stand   Toilet Transfer: Independent   Toileting- Architect and Hygiene: Independent   Retail buyer: Independent;Rolling walker;Grab bars;3 in 1   Functional mobility during ADLs: Independent;Rolling walker       Vision       Perception     Praxis      Cognition Arousal/Alertness: Awake/alert Behavior During Therapy: WFL for tasks assessed/performed Overall Cognitive Status: Within Functional Limits for tasks assessed                                          Exercises     Shoulder Instructions       General Comments      Pertinent Vitals/ Pain          Home Living Family/patient expects to be discharged to:: Private residence Living Arrangements: Non-relatives/Friends   Type of Home: Apartment       Home Layout: One level     Bathroom Shower/Tub: Chief Strategy Officer: Standard  Home Equipment: Dan Humphreys - 2 wheels   Additional Comments: Pt was previously living with sister; reports they had a "falling out" and lives in an apartment with her 'friend'. Pt reports sister threw everything of hers away, including AE and brace      Prior Functioning/Environment Level of Independence: Independent with assistive device(s)            Frequency           Progress Toward Goals  OT Goals(current goals can now be found in the care plan section)     Acute Rehab OT Goals Patient Stated Goal: "I need a walker" OT Goal Formulation: With patient Time For Goal Achievement: 09/24/20 Potential to Achieve Goals: Good  Plan      Co-evaluation                 AM-PAC OT "6 Clicks" Daily Activity     Outcome Measure   Help from another person eating meals?: None Help from another person  taking care of personal grooming?: None Help from another person toileting, which includes using toliet, bedpan, or urinal?: None Help from another person bathing (including washing, rinsing, drying)?: A Little Help from another person to put on and taking off regular upper body clothing?: None Help from another person to put on and taking off regular lower body clothing?: None 6 Click Score: 23    End of Session Equipment Utilized During Treatment: Rolling walker  OT Visit Diagnosis: Other abnormalities of gait and mobility (R26.89);History of falling (Z91.81)   Activity Tolerance Patient tolerated treatment well   Patient Left in bed   Nurse Communication Mobility status        Time: 1610-9604 OT Time Calculation (min): 17 min  Charges: OT General Charges $OT Visit: 1 Visit OT Evaluation $OT Eval Low Complexity: 1 Low OT Treatments $Self Care/Home Management : 8-22 mins 09/24/2020  Donne Hazel, MOT, OTR/L    [place] Brod 09/24/2020, 3:58 PM

## 2020-09-24 NOTE — Subjective & Objective (Signed)
Reason for consult: wheezing, green colored sputum Requesting provider: Cosby HPI: 50 yo AAF with long history of substance abuse disorder, hx of mental illness, presented [facility] on 09-20-2020 for SI/HI. Pt admitted voluntarily as she could not contract for safety.  Over the last 24 hours, providers at [facility] have noted pt to have a persistent cough and green colored sputum. Pt has not been febrile. O2 sats have been consistently >93% on RA.  Pt has been evaluated for Covid-19 on 4 separate occasions during this admission and has been found to be negative all 4 times.  Due to persistent cough and wheezing, [facility] consulted for management.  Pt admits to smoking at least 1/2 ppd for years. Pt also smokes crack cocaine on a routine basis. Pt denies any prior hx of tuberculosis. Pt denies any weight loss, night sweats or bloody sputum. Pt states she was in jail in the early 2000s but no recent incarcerations.  Most recent CBC on 09/18/2020 showed WBC of 6.5. VS have been stable. Highest recorded temperature of 99.2 on 09/23/20.  RA pulse ox 98% or better.  Pt denies any fevers. Pt shows me a plastic food cup that appears to have remnants of chocolate pudding with green color sputum at the bottom of the cup.

## 2020-09-24 NOTE — Progress Notes (Signed)
Orthopedic Tech Progress Note Patient Details:  Rachel Nolan June 23, 1970 [id number]  Patient ID: Rachel Nolan, female   DOB: August 21, 1970, 50 y.o.   MRN: [id number]  Rachel Nolan 09/24/2020, 3:44 PM Lumbar support placed on pt for comfort

## 2020-09-24 NOTE — [facility] Group Notes (Signed)
Topic:   Due to Covid-19 precautions, group was not held. Patient was provided therapeutic worksheets and asked to meet with CSW as needed.   Loriann Bosserman, LCSW, LCAS Clincal Social Worker  [facility]  

## 2020-09-24 NOTE — Progress Notes (Signed)
   09/24/20 0100  Psych Admission Type (Psych Patients Only)  Admission Status Voluntary  Psychosocial Assessment  Patient Complaints None  Eye Contact Fair  Facial Expression Sad  Affect Anxious;Depressed  Speech Logical/coherent  Interaction Assertive  Motor Activity Unsteady  Appearance/Hygiene Unremarkable  Behavior Characteristics Cooperative  Mood Anxious  Thought Process  Coherency WDL  Content WDL  Delusions None reported or observed  Perception WDL  Hallucination None reported or observed  Judgment Poor  Confusion None  Danger to Self  Current suicidal ideation? Passive  Self-Injurious Behavior No self-injurious ideation or behavior indicators observed or expressed   Agreement Not to Harm Self Yes  Description of Agreement verbal agreement to not harm self  Danger to Others  Danger to Others None reported or observed

## 2020-09-24 NOTE — Assessment & Plan Note (Signed)
Pt is not febrile. Pt is not hypoxic. I see no utility in performing any lab work/imaging at this time. Will treat with po doxycycline 100 mg bid x 5 days and mucinex-dm bid.  If pt becomes febrile(temp >100.4) then would proceed with basic lab work including CBC with diff, BMP. If she becomes hypoxic, then she would need transport to ED for evaluation for pneumonia to include CBC, CMP, blood cx and CXR.

## 2020-09-24 NOTE — Progress Notes (Signed)
PT Cancellation Note  Patient Details Name: Rachel Nolan MRN: [id number] DOB: 29-Nov-1970   Cancelled Treatment:    Reason Eval/Treat Not Completed: PT screened, no needs identified, will sign off Per OT.  Rada Hay 09/24/2020, 3:12 PM  Blanchard Kelch PT Acute Rehabilitation Services Pager [phone] Office [phone]

## 2020-09-24 NOTE — Assessment & Plan Note (Signed)
Management per psychiatry service. 

## 2020-09-24 NOTE — Consult Note (Signed)
Consultation History and Physical    Rachel LandrySandra Nolan ZOX:[id number] DOB: May 18, 1970 DOA: 09/20/2020  PCP: Claiborne RiggFleming, Rachel W, NP   Patient coming from: Home  I have personally briefly reviewed patient[facility]  Reason for consult: wheezing, green colored sputum Requesting provider: Cosby HPI: 50 yo AAF with long history of substance abuse disorder, hx of mental illness, presented [facility] on 09-20-2020 for SI/HI. Pt admitted voluntarily as she could not contract for safety.  Over the last 24 hours[facility] pt to have a persistent cough and green colored sputum. Pt has not been febrile. O2 sats have been consistently >93% on RA.  Pt has been evaluated for Covid-19 on 4 separate occasions during this admission and has been found to be negative all 4 times.  Due to persistent cough and wheezing, [facility] consulted for management.  Pt admits to smoking at least 1/2 ppd for years. Pt also smokes crack cocaine on a routine basis. Pt denies any prior hx of tuberculosis. Pt denies any weight loss, night sweats or bloody sputum. Pt states she was in jail in the early 2000s but no recent incarcerations.  Most recent CBC on 09/18/2020 showed WBC of 6.5. VS have been stable. Highest recorded temperature of 99.2 on 09/23/20.  RA pulse ox 98% or better.  Pt denies any fevers. Pt shows me a plastic food cup that appears to have remnants of chocolate pudding with green color sputum at the bottom of the cup.     Review of Systems:  Review of Systems  Constitutional:  Negative for chills, fever and malaise/fatigue.  HENT: Negative.    Eyes: Negative.   Respiratory:  Positive for cough, sputum production, shortness of breath and wheezing.   Cardiovascular:  Negative for chest pain.  Gastrointestinal: Negative.   Genitourinary: Negative.   Musculoskeletal: Negative.   Skin: Negative.   Neurological: Negative.   Endo/Heme/Allergies: Negative.   All other systems  reviewed and are negative.  Past Medical History:  Diagnosis Date   Anxiety    Asthma    Depression    Hypertension    Scoliosis     Past Surgical History:  Procedure Laterality Date   BACK SURGERY     ECTOPIC PREGNANCY SURGERY       reports that she has been smoking cigarettes. She has been smoking an average of 1 pack per day. She has never used smokeless tobacco. She reports current alcohol use. She reports current drug use. Drugs: Marijuana, Cocaine, and "Crack" cocaine.  No Known Allergies  Family History  Problem Relation Age of Onset   Hypertension Mother    Hypertension Father     Prior to Admission medications   Medication Sig Start Date End Date Taking? Authorizing Provider  ibuprofen (ADVIL) 200 MG tablet Take 400 mg by mouth every 6 (six) hours as needed for headache (pain).   Yes [provider]  albuterol (VENTOLIN HFA) 108 (90 Base) MCG/ACT inhaler Inhale 2 puffs into the lungs every 6 (six) hours as needed wheezing Patient not taking: No sig reported 08/29/20     amLODipine (NORVASC) 10 MG tablet Take 1 tablet (10 mg total) by mouth daily. Patient not taking: No sig reported 07/18/20   Antonieta Pertlary, Greg Lawson, MD  FLUoxetine (PROZAC) 40 MG capsule Take 1 capsule (40 mg total) by mouth daily. Patient not taking: No sig reported 07/18/20   Antonieta Pertlary, Greg Lawson, MD  gabapentin (NEURONTIN) 300 MG capsule Take 1  capsule (300 mg total) by mouth 3 (three) times daily. Patient not taking: No sig reported 08/29/20     gabapentin (NEURONTIN) 800 MG tablet Take 1 tablet (800 mg total) by mouth 3 (three) times daily. Patient not taking: Reported on 09/21/2020 07/18/20   Antonieta Pert, MD  prazosin (MINIPRESS) 1 MG capsule Take 3 capsules (3 mg total) by mouth at bedtime. Patient not taking: Reported on 09/21/2020 07/18/20   Antonieta Pert, MD  prazosin (MINIPRESS) 2 MG capsule Take 1 capsule (2 mg total) by mouth at bedtime. Patient not taking: No sig reported 08/29/20      QUEtiapine (SEROQUEL) 300 MG tablet Take 2 tablets (600 mg total) by mouth at bedtime. Patient not taking: No sig reported 08/29/20     traZODone (DESYREL) 100 MG tablet Take 1 tablet (100 mg total) by mouth at bedtime. Patient not taking: No sig reported 07/18/20   Antonieta Pert, MD  citalopram (CELEXA) 20 MG tablet Take 1 tablet (20 mg total) by mouth daily. 03/07/19 07/23/19  Aldean Baker, NP  lisinopril (ZESTRIL) 10 MG tablet Take 1 tablet (10 mg total) by mouth daily. 03/07/19 09/11/19  Aldean Baker, NP  pantoprazole (PROTONIX) 40 MG tablet Take 1 tablet (40 mg total) by mouth daily. 03/07/19 07/23/19  Aldean Baker, NP    Physical Exam: Vitals:   09/23/20 2021 09/24/20 0616 09/24/20 0617 09/24/20 1220  BP: (!) 144/94 (!) 135/96 (!) 142/110 (!) 153/102  Pulse: 79 89 (!) 107 65  Resp:    18  Temp:  98.1 F (36.7 C)  98.2 F (36.8 C)  TempSrc:  Oral  Oral  SpO2:  97%  100%  Weight:      Height:        Physical Exam Vitals and nursing note reviewed.  Constitutional:      General: She is not in acute distress.    Appearance: She is obese. She is not ill-appearing, toxic-appearing or diaphoretic.  HENT:     Head: Normocephalic and atraumatic.     Nose: Nose normal. No rhinorrhea.  Cardiovascular:     Rate and Rhythm: Normal rate and regular rhythm.     Pulses: Normal pulses.  Pulmonary:     Effort: No tachypnea, accessory muscle usage, respiratory distress or retractions.     Breath sounds: Normal air entry. No stridor. Examination of the right-upper field reveals decreased breath sounds. Examination of the left-upper field reveals decreased breath sounds. Examination of the right-middle field reveals wheezing. Examination of the left-middle field reveals wheezing. Examination of the right-lower field reveals wheezing. Examination of the left-lower field reveals wheezing. Decreased breath sounds and wheezing present.     Comments: Expiratory wheezing noted. Abdominal:      General: There is no distension.     Tenderness: There is no abdominal tenderness.  Musculoskeletal:     Right lower leg: No edema.     Left lower leg: No edema.  Skin:    General: Skin is warm and dry.     Capillary Refill: Capillary refill takes less than 2 seconds.  Neurological:     General: No focal deficit present.     Mental Status: She is alert and oriented to person, place, and time.     Labs on Admission: I have personally reviewed following labs and imaging studies  CBC: No results for input(s): WBC, NEUTROABS, HGB, HCT, MCV, PLT in the last 168 hours. Basic Metabolic Panel: Recent Labs  Lab  09/23/20 1841  NA 135  K 3.8  CL 100  CO2 28  GLUCOSE 112*  BUN 13  CREATININE 0.54  CALCIUM 8.9   GFR: Estimated Creatinine Clearance: 89.6 mL/min (by C-G formula based on SCr of 0.54 mg/dL). Liver Function Tests: No results for input(s): AST, ALT, ALKPHOS, BILITOT, PROT, ALBUMIN in the last 168 hours. No results for input(s): LIPASE, AMYLASE in the last 168 hours. No results for input(s): AMMONIA in the last 168 hours. Coagulation Profile: No results for input(s): INR, PROTIME in the last 168 hours. Cardiac Enzymes: No results for input(s): CKTOTAL, CKMB, CKMBINDEX, TROPONINI in the last 168 hours. BNP (last 3 results) No results for input(s): PROBNP in the last 8760 hours. HbA1C: Recent Labs    09/21/20 1833  HGBA1C 5.5   CBG: No results for input(s): GLUCAP in the last 168 hours. Lipid Profile: Recent Labs    09/21/20 1833  CHOL 145  HDL 67  LDLCALC 56  TRIG 109  CHOLHDL 2.2   Thyroid Function Tests: Recent Labs    09/21/20 1833  TSH 1.111   Anemia Panel: No results for input(s): VITAMINB12, FOLATE, FERRITIN, TIBC, IRON, RETICCTPCT in the last 72 hours. Urine analysis:    Component Value Date/Time   COLORURINE STRAW (A) 07/14/2020 0600   APPEARANCEUR CLEAR 07/14/2020 0600   LABSPEC 1.015 07/14/2020 0600   PHURINE 7.0 07/14/2020 0600    GLUCOSEU NEGATIVE 07/14/2020 0600   HGBUR NEGATIVE 07/14/2020 0600   BILIRUBINUR NEGATIVE 07/14/2020 0600   KETONESUR NEGATIVE 07/14/2020 0600   PROTEINUR NEGATIVE 07/14/2020 0600   NITRITE NEGATIVE 07/14/2020 0600   LEUKOCYTESUR NEGATIVE 07/14/2020 0600   Drugs of Abuse     Component Value Date/Time   LABOPIA NONE DETECTED 09/24/2020 0317   COCAINSCRNUR POSITIVE (A) 09/24/2020 0317   LABBENZ NONE DETECTED 09/24/2020 0317   AMPHETMU NONE DETECTED 09/24/2020 0317   THCU NONE DETECTED 09/24/2020 0317   LABBARB NONE DETECTED 09/24/2020 0317      Radiological Exams on Admission: I have personally reviewed images No results found.  EKG: I have personally reviewed EKG: shows NSR   Assessment/Plan Principal Problem:   MDD (major depressive disorder), recurrent, severe, with psychosis (HCC) Active Problems:   Acute bronchitis   Mild asthma exacerbation   Tobacco abuse   Post traumatic stress disorder (PTSD)   Cocaine use disorder (HCC)   Substance induced mood disorder (HCC)   Suicidal ideation   HTN (hypertension)   Neuropathic pain   Alcohol abuse   Homeless   Tachycardia   Asthma    Acute bronchitis Pt is not febrile. Pt is not hypoxic. I see no utility in performing any lab work/imaging at this time. Will treat with po doxycycline 100 mg bid x 5 days and mucinex-dm bid.  If pt becomes febrile(temp >100.4) then would proceed with basic lab work including CBC with diff, BMP. If she becomes hypoxic, then she would need transport to ED for evaluation for pneumonia to include CBC, CMP, blood cx and CXR.  Mild asthma exacerbation Start 5 day course of prednisone 40 mg. duonebs QID(while awake) for 5 days. Pt will need prn albuterol inhaler at discharge. I do not think nebulizer machine is reasonable since pt is homeless.  Tobacco abuse Pt admits to smoking 1/2 ppd of cigarettes and smoking crack cocaine. Pt UDS positive for cocaine on multiple occasions. Rx nicotine patch  daily.  MDD (major depressive disorder), recurrent, severe, with psychosis (HCC) Management per psychiatry service.  Post  traumatic stress disorder (PTSD) Management per psychiatry service.  Cocaine use disorder [facility]) Management per psychiatry service.  Substance induced mood disorder (HCC) Management per psychiatry service.  Suicidal ideation Management per psychiatry service.  Alcohol abuse Management per psychiatry service.  Homeless SW/CM involved.   Carollee Herter, DO [facility] 09/24/2020, 12:33 PM

## 2020-09-24 NOTE — Assessment & Plan Note (Signed)
Management per psychiatry service.

## 2020-09-24 NOTE — Assessment & Plan Note (Signed)
SW/CM involved.

## 2020-09-24 NOTE — Assessment & Plan Note (Signed)
Pt admits to smoking 1/2 ppd of cigarettes and smoking crack cocaine. Pt UDS positive for cocaine on multiple occasions. Rx nicotine patch daily.

## 2020-09-24 NOTE — Progress Notes (Signed)
[facility]  09/24/2020 2:59 PM Rachel Nolan  MRN:  [id number] Subjective:  Patient is a 49y/o female with h/o MDD with psychotic features, PTSD, and polysubstance abuse [facility] help with SI and HI. She is well known to this service from previous inpatient admissions.  Patient is currently on Hospital Day 4.  Chart Review, 24 hr Events: The patient's chart was reviewed and nursing notes were reviewed. The patient's case was discussed in multidisciplinary team meeting.  Per MAR: - Patient is compliant with scheduled meds. - No behavioral PRNs required.  Per RN notes, pt continued to be irritiable with active SI but able to contract for safety Patient slept 6.5 hours last night  Information Obtained Today During Patient Interview: The patient was seen and evaluated on the unit with Dr. Viviano SimasMaurer. Today, patient was pleasant and cooperative but still mildly tremulous in bed. Patient states she is feeling better today; "I still have this cough, but I'm better." Her cough is productive of sputum more gray in color than black today. Patient denies active SI "not at this present moment" but endorses active HI toward daughter but unable to specify a plan. She denies AVH, ideas of reference, or first rank symptomsShe is able to contract for safety during this admission, but not if she were to leave here. Patient continues to insist she is not experiencing withdrawal symptoms. She has been eating and sleeping appropriately, although her sleep has been affected by back pain without her brace. She additionally asks for a walker, that she uses to ambulate.   Principal Problem: MDD (major depressive disorder), recurrent, severe, with psychosis (HCC) Diagnosis: Principal Problem:   MDD (major depressive disorder), recurrent, severe, with psychosis (HCC) Active Problems:   Post traumatic stress disorder (PTSD)   Cocaine use disorder (HCC)   Substance induced mood disorder  (HCC)   Suicidal ideation   HTN (hypertension)   Neuropathic pain   Alcohol abuse   Homeless   Tachycardia   Asthma   Acute bronchitis   Mild asthma exacerbation   Tobacco abuse   Cough with sputum  Total Time spent with patient:  I personally spent 35 minutes on the unit in direct patient care. The direct patient care time included face-to-face time with the patient, reviewing the patient's chart, communicating with other professionals, and coordinating care. Greater than 50% of this time was spent in counseling or coordinating care with the patient regarding goals of hospitalization, psycho-education, and discharge planning needs.   Past Psychiatric History:  PTSD Major depressive disorder with psychotic features Past history of polysubstance abuse  Past Medical History:  Past Medical History:  Diagnosis Date   Anxiety    Asthma    Depression    Hypertension    Scoliosis     Past Surgical History:  Procedure Laterality Date   BACK SURGERY     ECTOPIC PREGNANCY SURGERY     Family History:  Family History  Problem Relation Age of Onset   Hypertension Mother    Hypertension Father    Family Psychiatric  History: Father and sister - substance abuse Social History:  Social History   Substance and Sexual Activity  Alcohol Use Yes   Comment: one 40 oz per month     Social History   Substance and Sexual Activity  Drug Use Yes   Types: Marijuana, Cocaine, "Crack" cocaine    Social History   Socioeconomic History   Marital status: Widowed  Spouse name: Not on file   Number of children: Not on file   Years of education: Not on file   Highest education level: Not on file  Occupational History   Not on file  Tobacco Use   Smoking status: Every Day    Packs/day: 1.00    Types: Cigarettes   Smokeless tobacco: Never  Vaping Use   Vaping Use: Never used  Substance and Sexual Activity   Alcohol use: Yes    Comment: one 40 oz per month   Drug use: Yes     Types: Marijuana, Cocaine, "Crack" cocaine   Sexual activity: Not Currently  Other Topics Concern   Not on file  Social History Narrative   Pt is homeless, no fixed address; not followed by an outpatient psychiatrist   Social Determinants of Health   Financial Resource Strain: Not on file  Food Insecurity: Not on file  Transportation Needs: Not on file  Physical Activity: Not on file  Stress: Not on file  Social Connections: Not on file   Additional Social History:    Pain Medications: See previous d/c summary from [facility] in June '22. Prescriptions: See previous d/c summary from [facility] in June '22.  Pt has been using more than what is prescribed. Over the Counter: See previous d/c summary from [facility] in June '22 History of alcohol / drug use?: Yes Longest period of sobriety (when/how long): 3 months Name of Substance 1: ETOH 1 - Age of First Use: unknown 1 - Amount (size/oz): Unknown 1 - Frequency: 4-5 times ina  week 1 - Duration: ongoing 1 - Last Use / Amount: 09/03 Two beers 1 - Method of Aquiring: purchase 1- Route of Use: orally Name of Substance 2: Marijuana 2 - Age of First Use: Unknonw 2 - Amount (size/oz): varies 2 - Frequency: 3-4 times in a week 2 - Duration: ongoing 2 - Last Use / Amount: 09/20/20 2 - Method of Aquiring: illegal purchase      Sleep: Good  Appetite:  Good  Current Medications: Current Facility-Administered Medications  Medication Dose Route Frequency Provider Last Rate Last Admin   acetaminophen (TYLENOL) tablet 650 mg  650 mg Oral Q6H PRN Nwoko, Uchenna E, PA   650 mg at 09/24/20 1027   albuterol (VENTOLIN HFA) 108 (90 Base) MCG/ACT inhaler 2 puff  2 puff Inhalation Q4H PRN Mason Jim, Amy E, MD   2 puff at 09/24/20 1025   alum & mag hydroxide-simeth (MAALOX/MYLANTA) 200-200-20 MG/5ML suspension 30 mL  30 mL Oral Q4H PRN Nwoko, Uchenna E, PA       amLODipine (NORVASC) tablet 10 mg  10 mg Oral Daily Singleton, Amy E, MD   10 mg at 09/24/20 1021    dextromethorphan-guaiFENesin (MUCINEX DM) 30-600 MG per 12 hr tablet 1 tablet  1 tablet Oral BID Carollee Herter, DO   1 tablet at 09/24/20 1316   doxycycline (VIBRA-TABS) tablet 100 mg  100 mg Oral Q12H Carollee Herter, DO   100 mg at 09/24/20 1318   FLUoxetine (PROZAC) capsule 20 mg  20 mg Oral Daily Mariel Craft, MD   20 mg at 09/24/20 1022   gabapentin (NEURONTIN) capsule 300 mg  300 mg Oral TID Lamar Sprinkles, MD   300 mg at 09/24/20 1318   guaiFENesin (MUCINEX) 12 hr tablet 1,200 mg  1,200 mg Oral BID PRN Nira Conn A, NP   1,200 mg at 09/24/20 1027   hydrOXYzine (ATARAX/VISTARIL) tablet 25 mg  25 mg Oral TID  PRN Karel Jarvis E, PA   25 mg at 09/24/20 1027   ipratropium-albuterol (DUONEB) 0.5-2.5 (3) MG/3ML nebulizer solution 3 mL  3 mL Nebulization QID Carollee Herter, DO   3 mL at 09/24/20 1318   magnesium hydroxide (MILK OF MAGNESIA) suspension 30 mL  30 mL Oral Daily PRN Nwoko, Uchenna E, PA       multivitamin with minerals tablet 1 tablet  1 tablet Oral Daily Mason Jim, Amy E, MD   1 tablet at 09/24/20 1022   nicotine (NICODERM CQ - dosed in mg/24 hours) patch 14 mg  14 mg Transdermal Daily Carollee Herter, DO   14 mg at 09/24/20 1320   prazosin (MINIPRESS) capsule 1 mg  1 mg Oral QHS Mason Jim, Amy E, MD   1 mg at 09/23/20 2033   predniSONE (DELTASONE) tablet 40 mg  40 mg Oral Q breakfast Carollee Herter, DO   40 mg at 09/24/20 1316   QUEtiapine (SEROQUEL) tablet 200 mg  200 mg Oral QHS Park Pope, MD   200 mg at 09/23/20 2033   thiamine tablet 100 mg  100 mg Oral Daily Mason Jim, Amy E, MD   100 mg at 09/24/20 1022   traZODone (DESYREL) tablet 50 mg  50 mg Oral QHS PRN Nwoko, Uchenna E, PA   50 mg at 09/23/20 2035    Lab Results:  Results for orders placed or performed during the hospital encounter of 09/20/20 (from the past 48 hour(s))  Resp Panel by RT-PCR (Flu A&B, Covid) Nasopharyngeal Swab     Status: None   Collection Time: 09/23/20 12:44 PM   Specimen: Nasopharyngeal Swab; Nasopharyngeal(NP)  swabs in vial transport medium  Result Value Ref Range   SARS Coronavirus 2 by RT PCR NEGATIVE NEGATIVE    Comment: (NOTE) SARS-CoV-2 target nucleic acids are NOT DETECTED.  The SARS-CoV-2 RNA is generally detectable in upper respiratory specimens during the acute phase of infection. The lowest concentration of SARS-CoV-2 viral copies this assay can detect is 138 copies/mL. A negative result does not preclude SARS-Cov-2 infection and should not be used as the sole basis for treatment or other patient management decisions. A negative result may occur with  improper specimen collection/handling, submission of specimen other than nasopharyngeal swab, presence of viral mutation(s) within the areas targeted by this assay, and inadequate number of viral copies(<138 copies/mL). A negative result must be combined with clinical observations, patient history, and epidemiological information. The expected result is Negative.  Fact Sheet for Patients:  BloggerCourse.com  Fact Sheet for Healthcare Providers:  SeriousBroker.it  This test is no t yet approved or cleared by the [place] FDA and  has been authorized for detection and/or diagnosis of SARS-CoV-2 by FDA under an Emergency Use Authorization (EUA). This EUA will remain  in effect (meaning this test can be used) for the duration of the COVID-19 declaration under Section 564(b)(1) of the Act, 21 U.S.C.section 360bbb-3(b)(1), unless the authorization is terminated  or revoked sooner.       Influenza A by PCR NEGATIVE NEGATIVE   Influenza B by PCR NEGATIVE NEGATIVE    Comment: (NOTE) The Xpert Xpress SARS-CoV-2/FLU/RSV plus assay is intended as an aid in the diagnosis of influenza from Nasopharyngeal swab specimens and should not be used as a sole basis for treatment. Nasal washings and aspirates are unacceptable for Xpert Xpress SARS-CoV-2/FLU/RSV testing.  Fact Sheet for  Patients: BloggerCourse.com  Fact Sheet for Healthcare Providers: SeriousBroker.it  This test is not yet approved or cleared by  the [employer] and has been authorized for detection and/or diagnosis of SARS-CoV-2 by FDA under an Emergency Use Authorization (EUA). This EUA will remain in effect (meaning this test can be used) for the duration of the COVID-19 declaration under Section 564(b)(1) of the Act, 21 U.S.C. section 360bbb-3(b)(1), unless the authorization is terminated or revoked.  Performed at [facility], [address], [place], [place] [address]   Basic metabolic panel     Status: Abnormal   Collection Time: 09/23/20  6:41 PM  Result Value Ref Range   Sodium 135 135 - 145 mmol/L   Potassium 3.8 3.5 - 5.1 mmol/L   Chloride 100 98 - 111 mmol/L   CO2 28 22 - 32 mmol/L   Glucose, Bld 112 (H) 70 - 99 mg/dL    Comment: Glucose reference range applies only to samples taken after fasting for at least 8 hours.   BUN 13 6 - 20 mg/dL   Creatinine, Ser 6.04 0.44 - 1.00 mg/dL   Calcium 8.9 8.9 - 54.0 mg/dL   GFR, Estimated >98 >11 mL/min    Comment: (NOTE) Calculated using the CKD-EPI Creatinine Equation (2021)    Anion gap 7 5 - 15    Comment: Performed at [facility], [address], [place], [place] [address]  Urine rapid drug screen (hosp performed)not at [facility]     Status: Abnormal   Collection Time: 09/24/20  3:17 AM  Result Value Ref Range   Opiates NONE DETECTED NONE DETECTED   Cocaine POSITIVE (A) NONE DETECTED   Benzodiazepines NONE DETECTED NONE DETECTED   Amphetamines NONE DETECTED NONE DETECTED   Tetrahydrocannabinol NONE DETECTED NONE DETECTED   Barbiturates NONE DETECTED NONE DETECTED    Comment: (NOTE) DRUG SCREEN FOR MEDICAL PURPOSES ONLY.  IF CONFIRMATION IS NEEDED FOR ANY PURPOSE, NOTIFY LAB WITHIN 5 DAYS.  LOWEST DETECTABLE LIMITS FOR URINE DRUG  SCREEN Drug Class                     Cutoff (ng/mL) Amphetamine and metabolites    1000 Barbiturate and metabolites    200 Benzodiazepine                 200 Tricyclics and metabolites     300 Opiates and metabolites        300 Cocaine and metabolites        300 THC                            50 Performed at [facility], [address] [place], [place] [address]   Pregnancy, urine     Status: None   Collection Time: 09/24/20  3:17 AM  Result Value Ref Range   Preg Test, Ur NEGATIVE NEGATIVE    Comment: Performed at [facility], [address], [place], [place] [address]  Resp Panel by RT-PCR (Flu A&B, Covid) Nasopharyngeal Swab     Status: None   Collection Time: 09/24/20  4:55 AM   Specimen: Nasopharyngeal Swab; Nasopharyngeal(NP) swabs in vial transport medium  Result Value Ref Range   SARS Coronavirus 2 by RT PCR NEGATIVE NEGATIVE    Comment: (NOTE) SARS-CoV-2 target nucleic acids are NOT DETECTED.  The SARS-CoV-2 RNA is generally detectable in upper respiratory specimens during the acute phase of infection. The lowest concentration of SARS-CoV-2 viral copies this assay can detect is 138 copies/mL. A negative  result does not preclude SARS-Cov-2 infection and should not be used as the sole basis for treatment or other patient management decisions. A negative result may occur with  improper specimen collection/handling, submission of specimen other than nasopharyngeal swab, presence of viral mutation(s) within the areas targeted by this assay, and inadequate number of viral copies(<138 copies/mL). A negative result must be combined with clinical observations, patient history, and epidemiological information. The expected result is Negative.  Fact Sheet for Patients:  BloggerCourse.com  Fact Sheet for Healthcare Providers:  SeriousBroker.it  This test is no t yet approved or cleared  by the [place] FDA and  has been authorized for detection and/or diagnosis of SARS-CoV-2 by FDA under an Emergency Use Authorization (EUA). This EUA will remain  in effect (meaning this test can be used) for the duration of the COVID-19 declaration under Section 564(b)(1) of the Act, 21 U.S.C.section 360bbb-3(b)(1), unless the authorization is terminated  or revoked sooner.       Influenza A by PCR NEGATIVE NEGATIVE   Influenza B by PCR NEGATIVE NEGATIVE    Comment: (NOTE) The Xpert Xpress SARS-CoV-2/FLU/RSV plus assay is intended as an aid in the diagnosis of influenza from Nasopharyngeal swab specimens and should not be used as a sole basis for treatment. Nasal washings and aspirates are unacceptable for Xpert Xpress SARS-CoV-2/FLU/RSV testing.  Fact Sheet for Patients: BloggerCourse.com  Fact Sheet for Healthcare Providers: SeriousBroker.it  This test is not yet approved or cleared by the [place] FDA and has been authorized for detection and/or diagnosis of SARS-CoV-2 by FDA under an Emergency Use Authorization (EUA). This EUA will remain in effect (meaning this test can be used) for the duration of the COVID-19 declaration under Section 564(b)(1) of the Act, 21 U.S.C. section 360bbb-3(b)(1), unless the authorization is terminated or revoked.  Performed at [facility], [address] [place], [place] [address]     Blood Alcohol level:  Lab Results  Component Value Date   ETH <10 09/21/2020   ETH <10 07/11/2020    Metabolic Disorder Labs: Lab Results  Component Value Date   HGBA1C 5.5 09/21/2020   MPG 111 09/21/2020   MPG 111.15 07/09/2020   No results found for: PROLACTIN Lab Results  Component Value Date   CHOL 145 09/21/2020   TRIG 109 09/21/2020   HDL 67 09/21/2020   CHOLHDL 2.2 09/21/2020   VLDL 22 09/21/2020   LDLCALC 56 09/21/2020   LDLCALC 60 07/09/2020     Physical Findings: CIWA:  CIWA-Ar Total: 4   Musculoskeletal: Strength & Muscle Tone: within normal limits Gait & Station: unable to assess. Patient laying in bed. Patient leans: N/A  Psychiatric Specialty Exam:  Presentation  General Appearance: Appropriate for environment; fairly groomed Eye Contact: good Speech: Clear and coherent; normal rate Speech Volume:Clear and coherent; normal rate and volume  Handedness:Right   Mood and Affect  Mood:Euthymic (I'm alright)  Affect:congruent, pleasant, cooperative   Thought Process  Thought Processes:Concrete, linear  Descriptions of Associations:Intact Orientation: Full (time, place, person) Thought Content:Patient endorses SI/HI without a plan, but denies AVH, paranoia, first rank symptoms. Patient is not grossly responding to internal/external stimuli on exam and did not make delusional statements  History of Schizophrenia/Schizoaffective disorder:No  Hallucinations:Denies  Ideas of Reference:None  Suicidal Thoughts:Yes, unclear plan or intent and contracts for safety on the unit Homicidal Thoughts:Yes, with intent, but denies plan  Sensorium  Memory: Immediate, Recent, and Remote good Judgment: poor Insight: lacking  Art therapist  Concentration:Fair  Attention Span:Good  Recall:Good  Fund of Knowledge:Fair  Language:Fair   Psychomotor Activity  Psychomotor Activity:restless, fidgety in bed, but less than previous day  Assets  Assets: resilience, ACTT services  Sleep  Sleep: 6.5 hours  Physical Exam Vitals and nursing note reviewed.  Constitutional:      Appearance: She is obese.  HENT:     Head: Normocephalic and atraumatic.  Pulmonary:     Effort: Pulmonary effort is normal. No respiratory distress.     Breath sounds: Wheezing present.     Comments: Bilateral expiratory wheezes auscultated in all lung fields. Otherwise, no additional abnormal breath sounds Neurological:      General: No focal deficit present.     Mental Status: She is alert.   Review of Systems  Constitutional:  Negative for chills and fever.  Respiratory:  Positive for cough and sputum production. Negative for shortness of breath.   Cardiovascular:  Negative for chest pain.  Gastrointestinal:  Negative for abdominal pain, constipation, diarrhea, heartburn, nausea and vomiting.  Neurological:  Negative for headaches.  Blood pressure (!) 153/102, pulse 65, temperature 98.2 F (36.8 C), temperature source Oral, resp. rate 18, height 5\' 6"  (1.676 m), weight 81.6 kg, last menstrual period 06/19/2018, SpO2 100 %. Body mass index is 29.05 kg/m.   Treatment Plan Summary: Daily contact with patient to assess and evaluate symptoms and progress in treatment and Medication management  ASSESSMENT Patient is a 49y/o female with h/o MDD with psychotic features, PTSD, and polysubstance abuse who walked-in to [facility] on 09/20/20 for help with SI and HI. She is well known to this service from previous inpatient admissions.  Patient is currently on Hospital Day 4.  PLAN: Safety and Monitoring:             -- Voluntary admission to inpatient psychiatric unit for safety, stabilization and treatment             -- Daily contact with patient to assess and evaluate symptoms and progress in treatment             -- Patient's case to be discussed in multi-disciplinary team meeting             -- Observation Level : q15 minute checks             -- Vital signs:  q12 hours             -- Precautions: suicide, elopement, and assault  2. Psychiatric Diagnoses and Treatment:   MDD recurrent severe vs Substance-induced mood disorder PTSD Suicidal Ideation  Medication Noncompliance -Increased Gabapentin to 300 mg tid for mood instability and potential withdrawal symptoms today -Continue Seroquel 200 mg qhs for sleep and mood instability- anticipate upward titration  - EKG Qtc 483; A1c 5.5%; Lipids WNL  -BMP: K+  3.8 -Repeat EKG pending. -Continue Prozac 20 mg qd for depression- titrating back up to home dose gradually as tolerated -Continue Prazosin 1 mg qd for PTSD -Continue Trazodone 50 mg qhs PRN for insomnia - SW to get collateral from ACTT about discharge needs  Stimulant use d/o - cocaine type Alcohol use d/o - Has PRNs available for withdrawal symptoms -CIWA protocol with Ativan 1mg  for scores >10 (recent scores, 0, 4, 5) - Oral thiamine and MVI replacement - Discuss meaningful substance abuse treatment when she will engage more in discussion -Continue Gabapentin to 300 mg tid for mood instability and potential withdrawal symptoms - UDS + Cocaine; ETOH <10  3.  Medical Problems  Cough Productive of blackish--> gray sputum Ongoing x 2 weeks, COVID neg; 14 kg weight loss since June. -Consult placed to IM. To be seen by Dr. Imogene Burn. Appreciate recs.  Lumbar back pain, chronic Patient requesting brace and walker for ambulation -Consult placed to PT. Appreciate recs.  Hypertension -Amlodipine 10 mg, will monitor BP and HR   4. Discharge Planning:              -- Social work and case management to assist with discharge planning and identification of hospital follow-up needs prior to discharge             -- Estimated LOS: 3-4 days             -- Discharge Concerns: Need to establish a safety plan; Medication compliance and effectiveness             -- Discharge Goals: Return home with outpatient referrals for mental health follow-up including medication management/psychotherapy     Lamar Sprinkles, MD 09/24/2020, 2:59 PM

## 2020-09-24 NOTE — B[facility] Group Notes (Signed)
Adult Psychoeducational Group Note  Date:  09/24/2020 Time:  3:43 PM  Group Topic/Focus:  Building Self Esteem:   The Focus of this group is helping patients become aware of the effects of self-esteem on their lives, the things they and others do that enhance or undermine their self-esteem, seeing the relationship between their level of self-esteem and the choices they make and learning ways to enhance self-esteem.  Participation Level:  None  Participation Quality:  Resistant  Affect:  Flat  Cognitive:  Lacking  Insight: Lacking  Engagement in Group:  Resistant  Modes of Intervention:  Discussion  Additional Comments:    Reymundo Poll 09/24/2020, 3:43 PM

## 2020-09-25 MED ORDER — POTASSIUM CITRATE-CITRIC ACID 1100-334 MG/5ML PO SOLN
20.0000 meq | Freq: Two times a day (BID) | ORAL | Status: DC
  Administered 2020-09-27: 20 meq via ORAL
  Filled 2020-09-25 (×8): qty 10

## 2020-09-25 NOTE — Progress Notes (Signed)
   09/25/20 2015  Psych Admission Type (Psych Patients Only)  Admission Status Voluntary  Psychosocial Assessment  Patient Complaints Other (Comment);Worrying (worried about her daughter in ICU in [facility])  Eye Contact Fair  Facial Expression Other (Comment) (appropriate)  Affect Appropriate to circumstance  Speech Logical/coherent  Interaction Assertive  Motor Activity Unsteady (uses walker)  Appearance/Hygiene Unremarkable  Behavior Characteristics Cooperative;Appropriate to situation  Mood Pleasant  Thought Process  Coherency WDL  Content WDL  Delusions None reported or observed  Perception WDL  Hallucination None reported or observed  Judgment Poor  Confusion None  Danger to Self  Current suicidal ideation? Denies  Danger to Others  Danger to Others None reported or observed   Pt seen on the telephone. Concerned about her oldest daughter who is in ICU in a hospital in [facility] with a chronic lung condition (sarcoidosis) and asthma. Pt denies SI, HI, AVH. "I've been pretty good today." Pt rates pain in her back as 8/10. Says very little helps it feel better. Although pt did say she did not wake up this morning as stiff as she normally is. Pt denied anxiety and depression as well. Pt wants to sign herself out of hospital to go see about her daughter. Denies any withdrawal symptoms.

## 2020-09-25 NOTE — B[facility] Group Notes (Signed)
The focus of this group is to help patients establish daily goals to achieve during treatment and discuss how the patient can incorporate goal setting into their daily lives to aide in recovery.  Patient refused to participated in group Complained  of not feeling well.

## 2020-09-25 NOTE — Progress Notes (Signed)
Pt did not join the evening wrap up group. Pt reported needing to take a shower before bed and did indeed shower.

## 2020-09-25 NOTE — Group Note (Signed)
Recreation Therapy Group Note   Group Topic:Team Building  Group Date: 09/25/2020 Start Time: 0945 End Time: 1045 Facilitators: Caroll Rancher, LRT/CTRS Location:  [address]  Group Topic: Communication, Team Building, Problem Solving  Goal Area(s) Addresses:  Patient will effectively work with peer towards shared goal.  Patient will identify skill used to make activity successful.  Patient will identify how skills used during activity can be used to reach post d/c goals.   Activity: In team's, using 25 small pipe cleaners, patients were asked to build the tallest free standing tower possible.    Education: Pharmacist, community, Building control surveyor.    Affect/Mood: N/A   Participation Level: Did not attend    Clinical Observations/Individualized Feedback:  Pt did not participate in group activity in hallway.     Plan: Continue to engage patient in RT group sessions 2-3x/week.   Caroll Rancher, LRT/CTRS 09/25/2020 1:45 PM

## 2020-09-25 NOTE — Progress Notes (Signed)
EKG performed again. Qtc now 509.

## 2020-09-25 NOTE — Progress Notes (Signed)
DAR Note: Patient calm and cooperative, denies SI/HI/AVH, took all AM meds as scheduled. Pt reported a good night's sleep, reported being depressed, but states that she is slowly getting better. Fall precautions in place, pt is ambulatory with a walker. Q15 minute checks for safety are currently in place.   09/25/20 1225  Psych Admission Type (Psych Patients Only)  Admission Status Voluntary  Psychosocial Assessment  Patient Complaints None  Eye Contact Fair  Facial Expression Sad  Affect Anxious;Depressed  Speech Logical/coherent  Interaction Assertive  Motor Activity Unsteady  Appearance/Hygiene Unremarkable  Behavior Characteristics Cooperative;Appropriate to situation  Mood Depressed  Aggressive Behavior  Targets  (no aggressive behaviors exhibited by patient)  Thought Process  Coherency WDL  Content WDL  Delusions None reported or observed  Perception WDL  Hallucination None reported or observed  Judgment Poor  Confusion None  Danger to Self  Current suicidal ideation? Denies  Self-Injurious Behavior No self-injurious ideation or behavior indicators observed or expressed   Agreement Not to Harm Self Yes  Description of Agreement verbal agreement to not harm self  Danger to Others  Danger to Others None reported or observed

## 2020-09-25 NOTE — [facility] Group Notes (Signed)
Adult Psychoeducational Group Note  Date:  09/25/2020 Time:  4:04 PM  Group Topic/Focus:  Developing a Wellness Toolbox:   The focus of this group is to help patients develop a "wellness toolbox" with skills and strategies to promote recovery upon discharge.  Participation Level:  Minimal  Participation Quality:  Resistant  Affect:  Irritable  Cognitive:  Appropriate  Insight: Improving  Engagement in Group:  Resistant  Modes of Intervention:  Exploration  Additional Comments:Patient was not  Rachel Nolan 09/25/2020, 4:04 PM

## 2020-09-25 NOTE — Progress Notes (Signed)
[facility] MD Progress Note  09/25/2020 4:25 PM Astria Jordahl  MRN:  [id number] Subjective:  Patient is a 50y/o female with h/o MDD with psychotic features, PTSD, and polysubstance abuse who walked-in to [facility] on 09/20/20 for help with SI and HI. She is well known to this service from previous inpatient admissions.  Patient is currently on Hospital Day 5.  Chart Review, 24 hr Events: The patient's chart was reviewed and nursing notes were reviewed. The patient's case was discussed in multidisciplinary team meeting.  Per MAR: - Patient is compliant with scheduled meds. - No behavioral PRNs required.  Per RN notes, pt continued to be irritiable with active SI but able to contract for safety Patient slept 6.5 hours last night  Information Obtained Today During Patient Interview: The patient was seen and evaluated on the unit with Dr. Gasper Sells. She is in the dayroom watching a movie, and ambulates back to her room to talk with our team. Today, Nazli is in a much better mood. She feels "gorgeous" today. She still endorses having a productive cough, although she does not cough during the assessment. She was able to sleep last night, with relief from the back brace, and her appetite is intact. She says that she is getting to the point where she is ready to ask her daughter for forgiveness. She now understands that her daughter experienced hurt in finding out the truth as much as she did in the traumatic experience. She denies HI toward her daughter/SI/AVH. She refuses to allow contact to be made with her daughter, saying that it is only up to the two of them to make amends. In thinking of discharge planning, Jerris gave permission to reach out to her sister, Hayes Ludwig ([phone]), with whom she will be staying. She asked about discharging today but was informed that a safety plan has to be in place. As well, we must make sure that her medications are at the correct dosage and that her Qtc interval is not  prolonged.   Spoke to Dr. Pola Corn, hospitalist. He informed that the Doxycycline is not likely to prolong the Qtc interval, so no changes to her antibiotics will be made at this time; he signed off.   Principal Problem: MDD (major depressive disorder), recurrent, severe, with psychosis (HCC) Diagnosis: Principal Problem:   MDD (major depressive disorder), recurrent, severe, with psychosis (HCC) Active Problems:   Post traumatic stress disorder (PTSD)   Cocaine use disorder (HCC)   Substance induced mood disorder (HCC)   Suicidal ideation   HTN (hypertension)   Neuropathic pain   Alcohol abuse   Homeless   Tachycardia   Asthma   Acute bronchitis   Mild asthma exacerbation   Tobacco abuse   Cough with sputum   Abnormal weight loss  Total Time spent with patient:  I personally spent 35 minutes on the unit in direct patient care. The direct patient care time included face-to-face time with the patient, reviewing the patient's chart, communicating with other professionals, and coordinating care. Greater than 50% of this time was spent in counseling or coordinating care with the patient regarding goals of hospitalization, psycho-education, and discharge planning needs.   Past Psychiatric History:  PTSD Major depressive disorder with psychotic features Past history of polysubstance abuse  Past Medical History:  Past Medical History:  Diagnosis Date   Anxiety    Asthma    Depression    Hypertension    Scoliosis     Past Surgical History:  Procedure Laterality  Date   BACK SURGERY     ECTOPIC PREGNANCY SURGERY     Family History:  Family History  Problem Relation Age of Onset   Hypertension Mother    Hypertension Father    Family Psychiatric  History: Father and sister - substance abuse Social History:  Social History   Substance and Sexual Activity  Alcohol Use Yes   Comment: one 40 oz per month     Social History   Substance and Sexual Activity  Drug Use Yes    Types: Marijuana, Cocaine, "Crack" cocaine    Social History   Socioeconomic History   Marital status: Widowed    Spouse name: Not on file   Number of children: Not on file   Years of education: Not on file   Highest education level: Not on file  Occupational History   Not on file  Tobacco Use   Smoking status: Every Day    Packs/day: 1.00    Types: Cigarettes   Smokeless tobacco: Never  Vaping Use   Vaping Use: Never used  Substance and Sexual Activity   Alcohol use: Yes    Comment: one 40 oz per month   Drug use: Yes    Types: Marijuana, Cocaine, "Crack" cocaine   Sexual activity: Not Currently  Other Topics Concern   Not on file  Social History Narrative   Pt is homeless, no fixed address; not followed by an outpatient psychiatrist   Social Determinants of Health   Financial Resource Strain: Not on file  Food Insecurity: Not on file  Transportation Needs: Not on file  Physical Activity: Not on file  Stress: Not on file  Social Connections: Not on file   Additional Social History:    Pain Medications: [facility]. Prescriptions: [facility]  Pt has been using more than what is prescribed. Over the Counter: See [facility] History of alcohol / drug use?: Yes Longest period of sobriety (when/how long): 3 months Name of Substance 1: ETOH 1 - Age of First Use: unknown 1 - Amount (size/oz): Unknown 1 - Frequency: 4-5 times ina  week 1 - Duration: ongoing 1 - Last Use / Amount: 09/03 Two beers 1 - Method of Aquiring: purchase 1- Route of Use: orally Name of Substance 2: Marijuana 2 - Age of First Use: Unknonw 2 - Amount (size/oz): varies 2 - Frequency: 3-4 times in a week 2 - Duration: ongoing 2 - Last Use / Amount: 09/20/20 2 - Method of Aquiring: illegal purchase      Sleep: Good  Appetite:  Good  Current Medications: Current Facility-Administered  Medications  Medication Dose Route Frequency Provider Last Rate Last Admin   acetaminophen (TYLENOL) tablet 650 mg  650 mg Oral Q6H PRN Nwoko, Uchenna E, PA   650 mg at 09/24/20 1709   albuterol (VENTOLIN HFA) 108 (90 Base) MCG/ACT inhaler 2 puff  2 puff Inhalation Q4H PRN Mason JimSingleton, Amy E, MD   2 puff at 09/24/20 1025   alum & mag hydroxide-simeth (MAALOX/MYLANTA) 200-200-20 MG/5ML suspension 30 mL  30 mL Oral Q4H PRN Nwoko, Uchenna E, PA       amLODipine (NORVASC) tablet 10 mg  10 mg Oral Daily Mason JimSingleton, Amy E, MD   10 mg at 09/25/20 0919   dextromethorphan-guaiFENesin (MUCINEX DM) 30-600 MG per 12 hr tablet 1 tablet  1 tablet Oral BID Carollee Herterhen, Eric, DO  1 tablet at 09/25/20 0920   doxycycline (VIBRA-TABS) tablet 100 mg  100 mg Oral Q12H Carollee Herter, DO   100 mg at 09/25/20 0623   FLUoxetine (PROZAC) capsule 20 mg  20 mg Oral Daily Mariel Craft, MD   20 mg at 09/25/20 7628   gabapentin (NEURONTIN) capsule 300 mg  300 mg Oral TID Lamar Sprinkles, MD   300 mg at 09/25/20 1200   guaiFENesin (MUCINEX) 12 hr tablet 1,200 mg  1,200 mg Oral BID PRN Nira Conn A, NP   1,200 mg at 09/24/20 1027   hydrOXYzine (ATARAX/VISTARIL) tablet 25 mg  25 mg Oral TID PRN Karel Jarvis E, PA   25 mg at 09/24/20 2118   ipratropium-albuterol (DUONEB) 0.5-2.5 (3) MG/3ML nebulizer solution 3 mL  3 mL Nebulization QID Carollee Herter, DO   3 mL at 09/25/20 0924   magnesium hydroxide (MILK OF MAGNESIA) suspension 30 mL  30 mL Oral Daily PRN Nwoko, Uchenna E, PA       metoprolol succinate (TOPROL-XL) 24 hr tablet 25 mg  25 mg Oral Daily Mariel Craft, MD   25 mg at 09/25/20 0919   multivitamin with minerals tablet 1 tablet  1 tablet Oral Daily Comer Locket, MD   1 tablet at 09/25/20 0920   nicotine (NICODERM CQ - dosed in mg/24 hours) patch 14 mg  14 mg Transdermal Daily Carollee Herter, DO   14 mg at 09/25/20 3151   prazosin (MINIPRESS) capsule 1 mg  1 mg Oral QHS Mason Jim, Amy E, MD   1 mg at 09/24/20 2117   predniSONE  (DELTASONE) tablet 40 mg  40 mg Oral Q breakfast Carollee Herter, DO   40 mg at 09/25/20 7616   QUEtiapine (SEROQUEL) tablet 200 mg  200 mg Oral QHS Park Pope, MD   200 mg at 09/24/20 2118   thiamine tablet 100 mg  100 mg Oral Daily Comer Locket, MD   100 mg at 09/25/20 0919   traZODone (DESYREL) tablet 50 mg  50 mg Oral QHS PRN Karel Jarvis E, PA   50 mg at 09/24/20 2118    Lab Results:  Results for orders placed or performed during the hospital encounter of 09/20/20 (from the past 48 hour(s))  Basic metabolic panel     Status: Abnormal   Collection Time: 09/23/20  6:41 PM  Result Value Ref Range   Sodium 135 135 - 145 mmol/L   Potassium 3.8 3.5 - 5.1 mmol/L   Chloride 100 98 - 111 mmol/L   CO2 28 22 - 32 mmol/L   Glucose, Bld 112 (H) 70 - 99 mg/dL    Comment: Glucose reference range applies only to samples taken after fasting for at least 8 hours.   BUN 13 6 - 20 mg/dL   Creatinine, Ser 0.73 0.44 - 1.00 mg/dL   Calcium 8.9 8.9 - 71.0 mg/dL   GFR, Estimated >62 >69 mL/min    Comment: (NOTE) Calculated using the CKD-EPI Creatinine Equation (2021)    Anion gap 7 5 - 15    Comment: Performed at [facility], [address] [place], [place] [address]  Urine rapid drug screen (hosp performed)not at [place]     Status: Abnormal   Collection Time: 09/24/20  3:17 AM  Result Value Ref Range   Opiates NONE DETECTED NONE DETECTED   Cocaine POSITIVE (A) NONE DETECTED   Benzodiazepines NONE DETECTED NONE DETECTED   Amphetamines NONE DETECTED NONE DETECTED  Tetrahydrocannabinol NONE DETECTED NONE DETECTED   Barbiturates NONE DETECTED NONE DETECTED    Comment: (NOTE) DRUG SCREEN FOR MEDICAL PURPOSES ONLY.  IF CONFIRMATION IS NEEDED FOR ANY PURPOSE, NOTIFY LAB WITHIN 5 DAYS.  LOWEST DETECTABLE LIMITS FOR URINE DRUG SCREEN Drug Class                     Cutoff (ng/mL) Amphetamine and metabolites    1000 Barbiturate and metabolites    200 Benzodiazepine                  200 Tricyclics and metabolites     300 Opiates and metabolites        300 Cocaine and metabolites        300 THC                            50 Performed at [facility], [address], [place], [place] [address]   Pregnancy, urine     Status: None   Collection Time: 09/24/20  3:17 AM  Result Value Ref Range   Preg Test, Ur NEGATIVE NEGATIVE    Comment: Performed at [facility], [address], [place], [place] [address]  Resp Panel by RT-PCR (Flu A&B, Covid) Nasopharyngeal Swab     Status: None   Collection Time: 09/24/20  4:55 AM   Specimen: Nasopharyngeal Swab; Nasopharyngeal(NP) swabs in vial transport medium  Result Value Ref Range   SARS Coronavirus 2 by RT PCR NEGATIVE NEGATIVE    Comment: (NOTE) SARS-CoV-2 target nucleic acids are NOT DETECTED.  The SARS-CoV-2 RNA is generally detectable in upper respiratory specimens during the acute phase of infection. The lowest concentration of SARS-CoV-2 viral copies this assay can detect is 138 copies/mL. A negative result does not preclude SARS-Cov-2 infection and should not be used as the sole basis for treatment or other patient management decisions. A negative result may occur with  improper specimen collection/handling, submission of specimen other than nasopharyngeal swab, presence of viral mutation(s) within the areas targeted by this assay, and inadequate number of viral copies(<138 copies/mL). A negative result must be combined with clinical observations, patient history, and epidemiological information. The expected result is Negative.  Fact Sheet for Patients:  BloggerCourse.com  Fact Sheet for Healthcare Providers:  SeriousBroker.it  This test is no t yet approved or cleared by the [place] FDA and  has been authorized for detection and/or diagnosis of SARS-CoV-2 by FDA under an Emergency Use Authorization (EUA). This EUA  will remain  in effect (meaning this test can be used) for the duration of the COVID-19 declaration under Section 564(b)(1) of the Act, 21 U.S.C.section 360bbb-3(b)(1), unless the authorization is terminated  or revoked sooner.       Influenza A by PCR NEGATIVE NEGATIVE   Influenza B by PCR NEGATIVE NEGATIVE    Comment: (NOTE) The Xpert Xpress SARS-CoV-2/FLU/RSV plus assay is intended as an aid in the diagnosis of influenza from Nasopharyngeal swab specimens and should not be used as a sole basis for treatment. Nasal washings and aspirates are unacceptable for Xpert Xpress SARS-CoV-2/FLU/RSV testing.  Fact Sheet for Patients: BloggerCourse.com  Fact Sheet for Healthcare Providers: SeriousBroker.it  This test is not yet approved or cleared by the [place] FDA and has been authorized for detection and/or diagnosis of SARS-CoV-2 by FDA under an Emergency Use Authorization (EUA). This EUA will remain in effect (meaning this test  can be used) for the duration of the COVID-19 declaration under Section 564(b)(1) of the Act, 21 U.S.C. section 360bbb-3(b)(1), unless the authorization is terminated or revoked.  Performed at [facility], [address], [place], [place] [address]     Blood Alcohol level:  Lab Results  Component Value Date   ETH <10 09/21/2020   ETH <10 07/11/2020    Metabolic Disorder Labs: Lab Results  Component Value Date   HGBA1C 5.5 09/21/2020   MPG 111 09/21/2020   MPG 111.15 07/09/2020   No results found for: PROLACTIN Lab Results  Component Value Date   CHOL 145 09/21/2020   TRIG 109 09/21/2020   HDL 67 09/21/2020   CHOLHDL 2.2 09/21/2020   VLDL 22 09/21/2020   LDLCALC 56 09/21/2020   LDLCALC 60 07/09/2020    Physical Findings: CIWA:  CIWA-Ar Total: 0   Musculoskeletal: Strength & Muscle Tone: within normal limits Gait & Station: unable to assess. Patient laying  in bed. Patient leans: N/A  Psychiatric Specialty Exam:  Presentation  General Appearance: Appropriate for environment; well- groomed, smiling Eye Contact: good Speech: Clear and coherent; normal rate Speech Volume:Clear and coherent; normal rate and volume  Handedness:Right   Mood and Affect  Mood:Euthymic (I feel gorgeous)  Affect:congruent, pleasant, cooperative   Thought Process  Thought Processes:Concrete, linear  Descriptions of Associations:Intact Orientation: Full (time, place, person) Thought Content:Patient denies SI/HI/AVH, paranoia, first rank symptoms. Patient is not grossly responding to internal/external stimuli on exam and did not make delusional statements  History of Schizophrenia/Schizoaffective disorder:No  Hallucinations:Denies  Ideas of Reference:None  Suicidal Thoughts:None Homicidal Thoughts:None  Sensorium  Memory: Immediate, Recent, and Remote good Judgment: poor Insight: shallow  Executive Functions  Concentration:Good  Attention Span:Good  Recall:Good  Fund of Knowledge:Fair  Language:Fair   Psychomotor Activity  Psychomotor Activity:Normal  Assets  Assets: resilience, ACTT services, desire for improvement, housing, social support  Sleep  Sleep: 6 hours  Physical Exam Vitals and nursing note reviewed.  Constitutional:      Appearance: She is obese.     Comments: More upbeat, smiling  HENT:     Head: Normocephalic and atraumatic.  Pulmonary:     Effort: Pulmonary effort is normal. No respiratory distress.  Neurological:     General: No focal deficit present.     Mental Status: She is alert.   Review of Systems  Constitutional:  Negative for chills and fever.  Respiratory:  Positive for cough and sputum production. Negative for shortness of breath.   Cardiovascular:  Negative for chest pain.  Gastrointestinal:  Negative for abdominal pain, constipation, diarrhea, heartburn, nausea and vomiting.  Musculoskeletal:   Positive for back pain.       Relieved by brace  Neurological:  Negative for headaches.  Blood pressure 131/85, pulse 86, temperature 97.9 F (36.6 C), temperature source Oral, resp. rate 18, height  (1.676 m), weight 81.6 kg, last menstrual period 06/19/2018, SpO2 98 %. Body mass index is 29.05 kg/m.   Treatment Plan Summary: Daily contact with patient to assess and evaluate symptoms and progress in treatment and Medication management  ASSESSMENT Patient is a 50y/o female with h/o MDD with psychotic features, PTSD, and polysubstance abuse who walked-in to [facility] on 09/20/20 for help with SI and HI. She is well known to this service from previous inpatient admissions.  Patient is currently on Hospital Day 5.  PLAN: Safety and Monitoring:             --  Voluntary admission to inpatient psychiatric unit for safety, stabilization and treatment             -- Daily contact with patient to assess and evaluate symptoms and progress in treatment             -- Patient's case to be discussed in multi-disciplinary team meeting             -- Observation Level : q15 minute checks             -- Vital signs:  q12 hours             -- Precautions: suicide, elopement, and assault  2. Psychiatric Diagnoses and Treatment:   MDD recurrent severe vs Substance-induced mood disorder PTSD Suicidal Ideation  Medication Noncompliance -Continue Gabapentin 300 mg tid for mood instability and potential withdrawal symptoms today -Continue Seroquel 200 mg qhs for sleep and mood instability- no upward titration d/t prolonged Qtc.   -Repeat EKG Qtc 509 (483 on 9/5); A1c 5.5%; Lipids WNL  -BMP: K+ 3.8 -Will obtain repeat BMP, Mg, EKG on morning labs; will initiate PO K+ supplementation if Mg <2.  -Continue Prozac 20 mg qd for depression- titrating back up to home dose gradually as tolerated -Continue Prazosin 1 mg qd for PTSD -Continue Trazodone 50 mg qhs PRN for insomnia - SW to get collateral from ACTT  about discharge needs  Stimulant use d/o - cocaine type Alcohol use d/o - Has PRNs available for withdrawal symptoms -CIWA protocol with Ativan  for scores >10 (recent scores, 3, 1, 0, 0) - Oral thiamine and MVI replacement - Discuss meaningful substance abuse treatment when she will engage more in discussion -Continue Gabapentin to 300 mg tid for mood instability and potential withdrawal symptoms - UDS + Cocaine; ETOH <10  3. Medical Problems  Cough Productive of blackish--> gray sputum Acute bronchitis Asthma Exacerbation, Acute on chronic Ongoing x 2 weeks, COVID neg; 14 kg weight loss since June. -Patient seen by hospitalist, Dr. Imogene Burn. Recs:  -Doxycyline 100 mg BID x 5 days. Dr. Pola Corn recommended no changes, as Doxy is less likely to prolong Qtc.   -Mucinex DM BID  -Prednisone 40 mg x 5 days  -Duonebs QID x 5 days Medications to be completed 9/11.  Lumbar back pain, chronic -OT and orthopedics provided patient with a lumbar support back brace and 2W rolling walker.   Hypertension Tachycardia  Patient tachycardic to 107 on 9/7 -Continue home Amlodipine 10 mg -Continue Toprol XL 25 mg   4. Discharge Planning:              -- Social work and case management to assist with discharge planning and identification of hospital follow-up needs prior to discharge             -- Estimated LOS: 3-4 days             -- Discharge Concerns: Need to establish a safety plan; Medication compliance and effectiveness             -- Discharge Goals: Return home with outpatient referrals for mental health follow-up including medication management/psychotherapy     Lamar Sprinkles, MD 09/25/2020, 4:25 PM

## 2020-09-25 NOTE — Progress Notes (Signed)
Nonbillable encounter.  Patient is primarily admitted under psychiatry services at behavioral health unit. On 9/7, hospitalist service was consulted for productive cough. Consult note by Dr. Imogene Burn reviewed. Patient did not have any fever, hypoxia.  Labs and imaging were not thought to be needed at that time. Is started the patient on oral doxycycline 100 mg twice daily for 5 days, prednisone 40 mg daily for 5 days and Mucinex for 5 days. Patient is feeling better. Will let her finish the course of the treatment.  No change in plan today. We'll sign off. Discussed with Dr. Alfonse Flavors, psychiatrist at behavioral health.  [facility] won't bill for today

## 2020-09-26 ENCOUNTER — Encounter (HOSPITAL_COMMUNITY): Payer: Self-pay

## 2020-09-26 DIAGNOSIS — R9431 Abnormal electrocardiogram [ECG] [EKG]: Secondary | ICD-10-CM | POA: Insufficient documentation

## 2020-09-26 LAB — BASIC METABOLIC PANEL
Anion gap: 9 (ref 5–15)
BUN: 20 mg/dL (ref 6–20)
CO2: 26 mmol/L (ref 22–32)
Calcium: 9.2 mg/dL (ref 8.9–10.3)
Chloride: 103 mmol/L (ref 98–111)
Creatinine, Ser: 0.82 mg/dL (ref 0.44–1.00)
GFR, Estimated: >60 mL/min (ref >60)
Glucose, Bld: 155 mg/dL — ABNORMAL HIGH (ref 70–99)
Potassium: 4.3 mmol/L (ref 3.5–5.1)
Sodium: 138 mmol/L (ref 135–145)

## 2020-09-26 LAB — RESP PANEL BY RT-PCR (FLU A&B, COVID) ARPGX2
Influenza A by PCR: NEGATIVE
Influenza B by PCR: NEGATIVE
SARS Coronavirus 2 by RT PCR: NEGATIVE

## 2020-09-26 LAB — MAGNESIUM: Magnesium: 2 mg/dL (ref 1.7–2.4)
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
## 2020-09-26 NOTE — B[facility] Counselor (Signed)
CSW provided this pt with homeless resources.   Pt was very upset when she was informed she was not scheduled for discharge on this date and demanded to see her provider.     Ruthann Cancer MSW, LCSW Clincal Social Worker  [facility]

## 2020-09-26 NOTE — [facility] Group Notes (Signed)
Type of Therapy:  Group Therapy   Modes of Intervention:  Self Affirmations   Due to the COVID-19 pandemic, this group has been supplemented with worksheets.   Summary of Progress/Problems: CSW provided worksheet to patient due to COVID. CSW offered to meet individually with patient as needed   Olney Monier, LCSWA Clinicial Social Worker [facility]  

## 2020-09-26 NOTE — Progress Notes (Signed)
Pt noted to be agitated, anxious and resistive to care for most of this afternoon. Pt declined her scheduled nebulizer treatment this morning and afternoon "I'm not taking no medications if y'all not going to let me go. I feel like y'all holding me against my will. I brought myself here, I've called for my ride already and I want to go right now". Pt signed a 72 hour request for d/c at 1341 witnessed by Clinical research associate. Observed to be much cooperative with care this evening. Agreed to have her EKG and nebulizer treatment done with increased verbal encouragement from assigned provider and Clinical research associate.  Q 15 minutes observation maintained without self harm gestures or outburst to note thus far. Emotional support and encouragement offered to pt this shift. Pt remains safe on unit. Pt participated in scheduled individual groups. Tolerated meals without discomfort. Denies concerns at this time.

## 2020-09-26 NOTE — Progress Notes (Signed)
   09/26/20 1930  Psych Admission Type (Psych Patients Only)  Admission Status Voluntary  Psychosocial Assessment  Patient Complaints None  Eye Contact Fair  Facial Expression Other (Comment) (appropriate)  Affect Appropriate to circumstance  Speech Logical/coherent  Interaction Assertive  Motor Activity Unsteady (uses walker)  Appearance/Hygiene Unremarkable  Behavior Characteristics Cooperative  Mood Pleasant  Thought Process  Coherency WDL  Content WDL  Delusions None reported or observed  Perception WDL  Hallucination None reported or observed  Judgment Poor  Confusion None  Danger to Self  Current suicidal ideation? Denies  Danger to Others  Danger to Others None reported or observed   Pt seen sitting in hallway near med window. Pt says she was irritable earlier today because she was not discharged. Pt signed 72 hour form today. Pt said that staff helped her calm down. Pt initially refused meds because she was upset. Pt agreed to take medication this evening. "I was upset but I'm okay now." Pt says her daughter is doing better in the hospital and is out of the ICU. Pt says she will be staying with her sister when she is discharged. Pt finally took a nebulizer treatment at about 1830. Will not take the next scheduled one unless she needs it. Pt pleasant and cooperative.

## 2020-09-26 NOTE — Group Note (Signed)
Recreation Therapy Group Note   Group Topic:Stress Management  Group Date: 09/26/2020 Start Time: 1020 End Time: 1040 Facilitators: Caroll Rancher, LRT/CTRS Location: [address] Dayroom   Goal Area(s) Addresses:  Patient will review and complete packet supporting identification of stressors and and techniques to combat compounding stress.   Description:  LRT provided pt a workbook reviewing stress management concepts and offering an opportunity to create a plan to address stressors post d/c. Pt given the option to complete the packet in the dayroom with RN/MHT staff and peers or at their own pace in their room.   Affect/Mood: Agitated   Participation Level: N/A   Participation Quality: Independent   Behavior: Agitated   Speech/Thought Process: Preoccupied   Insight: None   Judgement: Poor   Modes of Intervention: Independent Packet   Patient Response to Interventions:  Avoidant and Resistant    Education Outcome:  None   Clinical Observations/Individualized Feedback: Due to inability to use dayroom due to COVID precautions, LRT gave pt packets dealing with stress management.  Packets consisted of identifying largest source of stress, social supports, balance, basic needs and protective factors dealing with stress.  Pt received packet and proceeded to rip it up.    Plan: Continue to engage patient in RT group sessions 2-3x/week.   Caroll Rancher, LRT/CTRS  09/26/2020 1:24 PM

## 2020-09-26 NOTE — [facility] Group Notes (Signed)
Adult Psychoeducational Group Note  Date:  09/26/2020 Time:  9:25 AM  Group Topic/Focus:  Goals Group:   The focus of this group is to help patients establish daily goals to achieve during treatment and discuss how the patient can incorporate goal setting into their daily lives to aide in recovery. Modes of Intervention:  Activity  Additional Comments:  When asked about goals today the pt had no immediate goals to share.   Rachel Nolan Rachel Nolan 09/26/2020, 9:25 AM

## 2020-09-26 NOTE — BH IP Treatment Plan (Signed)
Interdisciplinary Treatment and Diagnostic Plan Update  09/26/2020 Time of Session:  Rachel Nolan MRN: [id number]  Principal Diagnosis: MDD (major depressive disorder), recurrent, severe, with psychosis (HCC)  Secondary Diagnoses: Principal Problem:   MDD (major depressive disorder), recurrent, severe, with psychosis (HCC) Active Problems:   Post traumatic stress disorder (PTSD)   Cocaine use disorder (HCC)   Substance induced mood disorder (HCC)   Suicidal ideation   HTN (hypertension)   Neuropathic pain   Alcohol abuse   Homeless   Tachycardia   Asthma   Acute bronchitis   Mild asthma exacerbation   Tobacco abuse   Cough with sputum   Abnormal weight loss   Prolonged Q-T interval on ECG   Current Medications:  Current Facility-Administered Medications  Medication Dose Route Frequency Provider Last Rate Last Admin   acetaminophen (TYLENOL) tablet 650 mg  650 mg Oral Q6H PRN Nwoko, Uchenna E, PA   650 mg at 09/25/20 1628   albuterol (VENTOLIN HFA) 108 (90 Base) MCG/ACT inhaler 2 puff  2 puff Inhalation Q4H PRN Comer Locket, MD   2 puff at 09/24/20 1025   alum & mag hydroxide-simeth (MAALOX/MYLANTA) 200-200-20 MG/5ML suspension 30 mL  30 mL Oral Q4H PRN Nwoko, Uchenna E, PA       amLODipine (NORVASC) tablet 10 mg  10 mg Oral Daily Mason Jim, Amy E, MD   10 mg at 09/26/20 0827   citric acid-potassium citrate (POLYCITRA) 10 mEq/5 ml solution 20 mEq  20 mEq Oral BID Cinderella, Margaret A       dextromethorphan-guaiFENesin (MUCINEX DM) 30-600 MG per 12 hr tablet 1 tablet  1 tablet Oral BID Carollee Herter, DO   1 tablet at 09/26/20 0826   doxycycline (VIBRA-TABS) tablet 100 mg  100 mg Oral Q12H Carollee Herter, DO   100 mg at 09/26/20 0828   FLUoxetine (PROZAC) capsule 20 mg  20 mg Oral Daily Mariel Craft, MD   20 mg at 09/26/20 2992   gabapentin (NEURONTIN) capsule 300 mg  300 mg Oral TID Lamar Sprinkles, MD   300 mg at 09/26/20 0826   guaiFENesin (MUCINEX) 12 hr tablet 1,200 mg   1,200 mg Oral BID PRN Nira Conn A, NP   1,200 mg at 09/24/20 1027   hydrOXYzine (ATARAX/VISTARIL) tablet 25 mg  25 mg Oral TID PRN Nwoko, Uchenna E, PA   25 mg at 09/25/20 2115   ipratropium-albuterol (DUONEB) 0.5-2.5 (3) MG/3ML nebulizer solution 3 mL  3 mL Nebulization QID Carollee Herter, DO   3 mL at 09/26/20 0828   magnesium hydroxide (MILK OF MAGNESIA) suspension 30 mL  30 mL Oral Daily PRN Nwoko, Uchenna E, PA       metoprolol succinate (TOPROL-XL) 24 hr tablet 25 mg  25 mg Oral Daily Mariel Craft, MD   25 mg at 09/26/20 0827   multivitamin with minerals tablet 1 tablet  1 tablet Oral Daily Bartholomew Crews E, MD   1 tablet at 09/26/20 0827   nicotine (NICODERM CQ - dosed in mg/24 hours) patch 14 mg  14 mg Transdermal Daily Carollee Herter, DO   14 mg at 09/25/20 0926   prazosin (MINIPRESS) capsule 1 mg  1 mg Oral QHS Mason Jim, Amy E, MD   1 mg at 09/25/20 2115   predniSONE (DELTASONE) tablet 40 mg  40 mg Oral Q breakfast Carollee Herter, DO   40 mg at 09/26/20 4268   QUEtiapine (SEROQUEL) tablet 200 mg  200 mg Oral Seabron Spates, MD  200 mg at 09/25/20 2115   thiamine tablet 100 mg  100 mg Oral Daily Mason Jim, Amy E, MD   100 mg at 09/26/20 0827   traZODone (DESYREL) tablet 50 mg  50 mg Oral QHS PRN Nwoko, Uchenna E, PA   50 mg at 09/25/20 2115   PTA Medications: Medications Prior to Admission  Medication Sig Dispense Refill Last Dose   ibuprofen (ADVIL) 200 MG tablet Take 400 mg by mouth every 6 (six) hours as needed for headache (pain).   09/21/2020   albuterol (VENTOLIN HFA) 108 (90 Base) MCG/ACT inhaler Inhale 2 puffs into the lungs every 6 (six) hours as needed wheezing (Patient not taking: No sig reported) 8.5 g 1 Not Taking   amLODipine (NORVASC) 10 MG tablet Take 1 tablet (10 mg total) by mouth daily. (Patient not taking: No sig reported) 30 tablet 0 Not Taking   FLUoxetine (PROZAC) 40 MG capsule Take 1 capsule (40 mg total) by mouth daily. (Patient not taking: No sig reported) 30 capsule  0 Not Taking   gabapentin (NEURONTIN) 300 MG capsule Take 1 capsule (300 mg total) by mouth 3 (three) times daily. (Patient not taking: No sig reported) 90 capsule 1 Not Taking   gabapentin (NEURONTIN) 800 MG tablet Take 1 tablet (800 mg total) by mouth 3 (three) times daily. (Patient not taking: Reported on 09/21/2020) 90 tablet 0 Not Taking   prazosin (MINIPRESS) 1 MG capsule Take 3 capsules (3 mg total) by mouth at bedtime. (Patient not taking: Reported on 09/21/2020) 30 capsule 0 Not Taking   prazosin (MINIPRESS) 2 MG capsule Take 1 capsule (2 mg total) by mouth at bedtime. (Patient not taking: No sig reported) 30 capsule 1 Not Taking   QUEtiapine (SEROQUEL) 300 MG tablet Take 2 tablets (600 mg total) by mouth at bedtime. (Patient not taking: No sig reported) 60 tablet 1 Not Taking   traZODone (DESYREL) 100 MG tablet Take 1 tablet (100 mg total) by mouth at bedtime. (Patient not taking: No sig reported) 15 tablet 0 Not Taking    Patient Stressors: Marital or family conflict   Substance abuse    Patient Strengths: Motivation for treatment/growth   Treatment Modalities: Medication Management, Group therapy, Case management,  1 to 1 session with clinician, Psychoeducation, Recreational therapy.   Physician Treatment Plan for Primary Diagnosis: MDD (major depressive disorder), recurrent, severe, with psychosis (HCC) Long Term Goal(s): Improvement in symptoms so as ready for discharge   Short Term Goals: Ability to identify changes in lifestyle to reduce recurrence of condition will improve Ability to verbalize feelings will improve Ability to disclose and discuss suicidal ideas Ability to demonstrate self-control will improve Ability to identify and develop effective coping behaviors will improve Ability to maintain clinical measurements within normal limits will improve Compliance with prescribed medications will improve Ability to identify triggers associated with substance abuse/mental  health issues will improve  Medication Management: Evaluate patient's response, side effects, and tolerance of medication regimen.  Therapeutic Interventions: 1 to 1 sessions, Unit Group sessions and Medication administration.  Evaluation of Outcomes: Progressing  Physician Treatment Plan for Secondary Diagnosis: Principal Problem:   MDD (major depressive disorder), recurrent, severe, with psychosis (HCC) Active Problems:   Post traumatic stress disorder (PTSD)   Cocaine use disorder (HCC)   Substance induced mood disorder (HCC)   Suicidal ideation   HTN (hypertension)   Neuropathic pain   Alcohol abuse   Homeless   Tachycardia   Asthma   Acute bronchitis  Mild asthma exacerbation   Tobacco abuse   Cough with sputum   Abnormal weight loss   Prolonged Q-T interval on ECG  Long Term Goal(s): Improvement in symptoms so as ready for discharge   Short Term Goals: Ability to identify changes in lifestyle to reduce recurrence of condition will improve Ability to verbalize feelings will improve Ability to disclose and discuss suicidal ideas Ability to demonstrate self-control will improve Ability to identify and develop effective coping behaviors will improve Ability to maintain clinical measurements within normal limits will improve Compliance with prescribed medications will improve Ability to identify triggers associated with substance abuse/mental health issues will improve     Medication Management: Evaluate patient's response, side effects, and tolerance of medication regimen.  Therapeutic Interventions: 1 to 1 sessions, Unit Group sessions and Medication administration.  Evaluation of Outcomes: Progressing   RN Treatment Plan for Primary Diagnosis: MDD (major depressive disorder), recurrent, severe, with psychosis (HCC) Long Term Goal(s): Knowledge of disease and therapeutic regimen to maintain health will improve  Short Term Goals: Ability to demonstrate  self-control, Ability to participate in decision making will improve, and Ability to verbalize feelings will improve  Medication Management: RN will administer medications as ordered by provider, will assess and evaluate patient's response and provide education to patient for prescribed medication. RN will report any adverse and/or side effects to prescribing provider.  Therapeutic Interventions: 1 on 1 counseling sessions, Psychoeducation, Medication administration, Evaluate responses to treatment, Monitor vital signs and CBGs as ordered, Perform/monitor CIWA, COWS, AIMS and Fall Risk screenings as ordered, Perform wound care treatments as ordered.  Evaluation of Outcomes: Progressing   LCSW Treatment Plan for Primary Diagnosis: MDD (major depressive disorder), recurrent, severe, with psychosis (HCC) Long Term Goal(s): Safe transition to appropriate next level of care at discharge, Engage patient in therapeutic group addressing interpersonal concerns.  Short Term Goals: Engage patient in aftercare planning with referrals and resources, Increase social support, and Increase ability to appropriately verbalize feelings  Therapeutic Interventions: Assess for all discharge needs, 1 to 1 time with Social worker, Explore available resources and support systems, Assess for adequacy in community support network, Educate family and significant other(s) on suicide prevention, Complete Psychosocial Assessment, Interpersonal group therapy.  Evaluation of Outcomes: Progressing   Progress in Treatment: Attending groups: Yes. Participating in groups: Yes. Taking medication as prescribed: Yes. Toleration medication: Yes. Family/Significant other contact made: No, will contact:  pt declined Patient understands diagnosis: No. Discussing patient identified problems/goals with staff: No. Medical problems stabilized or resolved: Yes. Denies suicidal/homicidal ideation: Yes. Issues/concerns per patient  self-inventory: Yes. Other: None  New problem(s) identified: No, Describe:  None  New Short Term/Long Term Goal(s):medication stabilization, elimination of SI thoughts, development of comprehensive mental wellness plan.   Patient Goals:  Did Not Attend  Discharge Plan or Barriers: Patient recently admitted. CSW will continue to follow and assess for appropriate referrals and possible discharge planning.   Reason for Continuation of Hospitalization: Medication stabilization  Estimated Length of Stay: 3-5 days   Scribe for Treatment Team: Chrys Racer 09/26/2020 12:43 PM

## 2020-09-26 NOTE — Progress Notes (Signed)
Adult Psychoeducational Group Note  Date:  09/26/2020 Time:  3:49 PM  Group Topic/Focus:  Recovery Goals:   The focus of this group is to identify appropriate goals for recovery and establish a plan to achieve them.  Modes of Intervention:  Education  Additional Comments:  Material for the topic of "setbacks in recovery" were passed out to the pt's. Pt's were encouraged to complete the activity and use the RN and Tech to work through it with them if need be.    Rachel Nolan Rachel Nolan 09/26/2020, 3:49 PM

## 2020-09-27 MED ORDER — DM-GUAIFENESIN ER 30-600 MG PO TB12
1.0000 | ORAL_TABLET | Freq: Two times a day (BID) | ORAL | 0 refills | Status: AC
  Filled 2020-09-27: qty 2, 1d supply, fill #0

## 2020-09-27 MED ORDER — DOXYCYCLINE HYCLATE 100 MG PO TABS
100.0000 mg | ORAL_TABLET | Freq: Two times a day (BID) | ORAL | 0 refills | Status: AC
  Filled 2020-09-27: qty 2, 1d supply, fill #0

## 2020-09-27 MED ORDER — NICOTINE 14 MG/24HR TD PT24
14.0000 mg | MEDICATED_PATCH | Freq: Every day | TRANSDERMAL | 0 refills | Status: DC
  Filled 2020-09-27: qty 28, 28d supply, fill #0

## 2020-09-27 MED ORDER — IPRATROPIUM-ALBUTEROL 0.5-2.5 (3) MG/3ML IN SOLN
3.0000 mL | Freq: Four times a day (QID) | RESPIRATORY_TRACT | 0 refills | Status: DC
  Filled 2020-09-27: qty 360, 30d supply, fill #0

## 2020-09-27 MED ORDER — QUETIAPINE FUMARATE 200 MG PO TABS
200.0000 mg | ORAL_TABLET | Freq: Every day | ORAL | 0 refills | Status: DC
  Filled 2020-09-27: qty 30, 30d supply, fill #0

## 2020-09-27 MED ORDER — FLUOXETINE HCL 20 MG PO CAPS
20.0000 mg | ORAL_CAPSULE | Freq: Every day | ORAL | 0 refills | Status: DC
  Filled 2020-09-27: qty 30, 30d supply, fill #0

## 2020-09-27 MED ORDER — METOPROLOL SUCCINATE ER 25 MG PO TB24
25.0000 mg | ORAL_TABLET | Freq: Every day | ORAL | 0 refills | Status: DC
  Filled 2020-09-27: qty 30, 30d supply, fill #0

## 2020-09-27 MED ORDER — PREDNISONE 20 MG PO TABS
40.0000 mg | ORAL_TABLET | Freq: Every day | ORAL | 0 refills | Status: AC
  Filled 2020-09-27: qty 2, 1d supply, fill #0

## 2020-09-27 MED ORDER — PRAZOSIN HCL 1 MG PO CAPS
1.0000 mg | ORAL_CAPSULE | Freq: Every day | ORAL | 0 refills | Status: DC
  Filled 2020-09-27: qty 30, 30d supply, fill #0

## 2020-09-27 NOTE — [facility] Suicide Risk Assessment (Signed)
[facility] INPATIENT:  Family/Significant Other Suicide Prevention Education  Suicide Prevention Education:  Education Completed; Sister, Iva Boop ([phone]), has been identified by the patient as the family member/significant other with whom the patient will be residing, and identified as the person(s) who will aid the patient in the event of a mental health crisis (suicidal ideations/suicide attempt).  With written consent from the patient, the family member/significant other has been provided the following suicide prevention education, prior to the and/or following the discharge of the patient.  The suicide prevention education provided includes the following: Suicide risk factors Suicide prevention and interventions National Suicide Hotline telephone number [facility] assessment telephone number [facility] Emergency Assistance 911 [facility] and/or Residential Mobile Crisis Unit telephone number  Request made of family/significant other to: Remove weapons (e.g., guns, rifles, knives), all items previously/currently identified as safety concern.   Remove drugs/medications (over-the-counter, prescriptions, illicit drugs), all items previously/currently identified as a safety concern.  The family member/significant other verbalizes understanding of the suicide prevention education information provided.  The family member/significant other agrees to remove the items of safety concern listed above.  Shadana Pry A Cameren Odwyer 09/27/2020, 9:55 AM

## 2020-09-27 NOTE — Progress Notes (Signed)
  [facility] Adult Case Management Discharge Plan :  Will you be returning to the same living situation after discharge:  Yes,  home with sister At discharge, do you have transportation home?: Yes,  friend to pick this pt up Do you have the ability to pay for your medications: No. Samples to be provided at [facility]ich[facility]ge  Release of information consent forms completed and in the chart;  Patient's signature needed at discharge.  Patient to Follow up at:  Follow-up Information     [facility] Follow up.   Why: ACTT Team will begin services with you again as soon as you leave the hospital.  The crisis number is [phone]. Contact information: [address] [place] [address] [phone]                 Next level of care provider has access to [facility] Link:no  Safety Planning and Suicide Prevention discussed: Yes,  with sister     Has patient been referred to the Quitline?: Patient refused referral  Patient has been referred for addiction treatment: Pt. refused referral  Otelia Santee, LCSW 09/27/2020, 10:12 AM

## 2020-09-27 NOTE — [facility] Group Notes (Signed)
The focus of this group is to help patients review their daily goal of treatment and discuss progress on daily workbooks.  Processed with Patient about setting goals each day in order to archive her goals for recovery. Patient understands how to  meet her goals as evidenced by stating that it takes "small steps each day that only I can do."   Patient was reminded  that each time she felt she was having a set-back, to keep that attitudes in order to achieve her goals.

## 2020-09-27 NOTE — Plan of Care (Signed)
Patient discharged to home/self care on her own accord.  Patient denies SI, HI and AVH upon discharge.  Patient acknowledge receipt of all personal belongings and understanding of discharge instructions.
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
## 2020-09-27 NOTE — Group Note (Signed)
Clinical Social Work Note  No group could be held today due to a COVID-19 outbreak on the unit.  An educational packet about anger management was provided to the patient to work on individually.  Jariah Tarkowski Grossman-Orr, LCSW 09/27/2020  9:42 AM    

## 2020-09-29 ENCOUNTER — Other Ambulatory Visit: Payer: Self-pay

## 2020-09-30 ENCOUNTER — Other Ambulatory Visit: Payer: Self-pay

## 2020-11-20 ENCOUNTER — Encounter (HOSPITAL_COMMUNITY): Payer: 59 | Admitting: Psychiatry

## 2020-11-24 ENCOUNTER — Other Ambulatory Visit: Payer: Self-pay

## 2020-11-24 ENCOUNTER — Other Ambulatory Visit (HOSPITAL_COMMUNITY): Payer: Self-pay | Admitting: Nurse Practitioner

## 2020-11-24 NOTE — Telephone Encounter (Signed)
Pt calling and is requesting to have these refilled. She states that she has been out x2 days. Please advise.

## 2020-11-25 ENCOUNTER — Other Ambulatory Visit: Payer: Self-pay

## 2020-11-25 NOTE — Telephone Encounter (Signed)
Requested medications are due for refill today yes  Requested medications are on the active medication list yes  Last refill 09/30/20  Last visit Do not see where this pt has been seen by this practice  Future visit scheduled no  Notes to clinic prescriptions are written by two providers not in this practice. Please assess. Requested Prescriptions  Pending Prescriptions Disp Refills   gabapentin (NEURONTIN) 300 MG capsule 90 capsule 1    Sig: Take 1 capsule (300 mg total) by mouth 3 (three) times daily.     There is no refill protocol information for this order     metoprolol succinate (TOPROL-XL) 25 MG 24 hr tablet 30 tablet 0    Sig: Take 1 tablet (25 mg total) by mouth daily.     There is no refill protocol information for this order

## 2020-11-26 ENCOUNTER — Other Ambulatory Visit: Payer: Self-pay

## 2020-11-26 MED ORDER — GABAPENTIN 300 MG PO CAPS
300.0000 mg | ORAL_CAPSULE | Freq: Three times a day (TID) | ORAL | 2 refills | Status: DC
Start: 2020-11-26 — End: 2021-02-26
  Filled 2020-11-26: qty 90, 30d supply, fill #0

## 2020-11-26 MED ORDER — FLUOXETINE HCL 40 MG PO CAPS
40.0000 mg | ORAL_CAPSULE | Freq: Every morning | ORAL | 2 refills | Status: DC
Start: 2020-11-26 — End: 2021-02-26
  Filled 2020-11-26: qty 30, 30d supply, fill #0

## 2020-11-26 MED ORDER — QUETIAPINE FUMARATE 300 MG PO TABS
600.0000 mg | ORAL_TABLET | Freq: Every evening | ORAL | 2 refills | Status: DC
  Filled 2020-11-26: qty 60, 30d supply, fill #0

## 2020-11-26 MED ORDER — PRAZOSIN HCL 1 MG PO CAPS
3.0000 mg | ORAL_CAPSULE | Freq: Every evening | ORAL | 2 refills | Status: DC
Start: 2020-11-26 — End: 2021-02-26
  Filled 2020-11-26: qty 90, 30d supply, fill #0

## 2020-11-26 MED ORDER — TRAZODONE HCL 150 MG PO TABS
150.0000 mg | ORAL_TABLET | Freq: Every evening | ORAL | 2 refills | Status: DC
Start: 2020-11-26 — End: 2021-02-26
  Filled 2020-11-26: qty 30, 30d supply, fill #0

## 2020-11-27 ENCOUNTER — Other Ambulatory Visit: Payer: Self-pay

## 2020-11-28 ENCOUNTER — Other Ambulatory Visit: Payer: Self-pay

## 2021-01-20 ENCOUNTER — Other Ambulatory Visit: Payer: Self-pay

## 2021-02-11 ENCOUNTER — Encounter (HOSPITAL_COMMUNITY): Payer: Self-pay

## 2021-02-11 ENCOUNTER — Inpatient Hospital Stay (HOSPITAL_COMMUNITY)
Admission: EM | Admit: 2021-02-11 | Discharge: 2021-02-12 | DRG: 202 | Discharge status: Against Medical Advice | Payer: Self-pay | Attending: Internal Medicine | Admitting: Internal Medicine

## 2021-02-11 ENCOUNTER — Emergency Department (HOSPITAL_COMMUNITY): Payer: Self-pay

## 2021-02-11 DIAGNOSIS — J4521 Mild intermittent asthma with (acute) exacerbation: Principal | ICD-10-CM | POA: Diagnosis present

## 2021-02-11 DIAGNOSIS — Z716 Tobacco abuse counseling: Secondary | ICD-10-CM

## 2021-02-11 DIAGNOSIS — F1410 Cocaine abuse, uncomplicated: Secondary | ICD-10-CM | POA: Diagnosis present

## 2021-02-11 DIAGNOSIS — Z9151 Personal history of suicidal behavior: Secondary | ICD-10-CM

## 2021-02-11 DIAGNOSIS — Z7151 Drug abuse counseling and surveillance of drug abuser: Secondary | ICD-10-CM

## 2021-02-11 DIAGNOSIS — J45901 Unspecified asthma with (acute) exacerbation: Secondary | ICD-10-CM | POA: Diagnosis present

## 2021-02-11 DIAGNOSIS — F1010 Alcohol abuse, uncomplicated: Secondary | ICD-10-CM | POA: Diagnosis present

## 2021-02-11 DIAGNOSIS — F17210 Nicotine dependence, cigarettes, uncomplicated: Secondary | ICD-10-CM | POA: Diagnosis present

## 2021-02-11 DIAGNOSIS — I10 Essential (primary) hypertension: Secondary | ICD-10-CM | POA: Diagnosis present

## 2021-02-11 DIAGNOSIS — Z5329 Procedure and treatment not carried out because of patient's decision for other reasons: Secondary | ICD-10-CM | POA: Diagnosis present

## 2021-02-11 DIAGNOSIS — F323 Major depressive disorder, single episode, severe with psychotic features: Secondary | ICD-10-CM | POA: Diagnosis present

## 2021-02-11 DIAGNOSIS — Z20822 Contact with and (suspected) exposure to covid-19: Secondary | ICD-10-CM | POA: Diagnosis present

## 2021-02-11 DIAGNOSIS — F419 Anxiety disorder, unspecified: Secondary | ICD-10-CM | POA: Diagnosis present

## 2021-02-11 DIAGNOSIS — Z79899 Other long term (current) drug therapy: Secondary | ICD-10-CM

## 2021-02-11 DIAGNOSIS — Z8249 Family history of ischemic heart disease and other diseases of the circulatory system: Secondary | ICD-10-CM

## 2021-02-11 LAB — BASIC METABOLIC PANEL
Anion gap: 10 (ref 5–15)
BUN: 9 mg/dL (ref 6–20)
CO2: 25 mmol/L (ref 22–32)
Calcium: 9.3 mg/dL (ref 8.9–10.3)
Chloride: 106 mmol/L (ref 98–111)
Creatinine, Ser: 0.55 mg/dL (ref 0.44–1.00)
GFR, Estimated: >60 mL/min (ref >60)
Glucose, Bld: 86 mg/dL (ref 70–99)
Potassium: 4.2 mmol/L (ref 3.5–5.1)
Sodium: 141 mmol/L (ref 135–145)

## 2021-02-11 LAB — CBC WITH DIFFERENTIAL/PLATELET
Abs Immature Granulocytes: 0.01 K/uL (ref 0.00–0.07)
Basophils Absolute: 0 K/uL (ref 0.0–0.1)
Basophils Relative: 0 %
Eosinophils Absolute: 0.2 K/uL (ref 0.0–0.5)
Eosinophils Relative: 5 %
HCT: 38.8 % (ref 36.0–46.0)
Hemoglobin: 13.1 g/dL (ref 12.0–15.0)
Immature Granulocytes: 0 %
Lymphocytes Relative: 38 %
Lymphs Abs: 1.7 K/uL (ref 0.7–4.0)
MCH: 32 pg (ref 26.0–34.0)
MCHC: 33.8 g/dL (ref 30.0–36.0)
MCV: 94.6 fL (ref 80.0–100.0)
Monocytes Absolute: 0.5 K/uL (ref 0.1–1.0)
Monocytes Relative: 11 %
Neutro Abs: 2 K/uL (ref 1.7–7.7)
Neutrophils Relative %: 46 %
Platelets: 227 K/uL (ref 150–400)
RBC: 4.1 MIL/uL (ref 3.87–5.11)
RDW: 14.6 % (ref 11.5–15.5)
WBC: 4.5 K/uL (ref 4.0–10.5)
nRBC: 0 % (ref 0.0–0.2)

## 2021-02-11 LAB — I-STAT VENOUS BLOOD GAS, ED
Acid-Base Excess: 3 mmol/L — ABNORMAL HIGH (ref 0.0–2.0)
Bicarbonate: 28.3 mmol/L — ABNORMAL HIGH (ref 20.0–28.0)
Calcium, Ion: 0.97 mmol/L — ABNORMAL LOW (ref 1.15–1.40)
HCT: 41 % (ref 36.0–46.0)
Hemoglobin: 13.9 g/dL (ref 12.0–15.0)
O2 Saturation: 99 %
Potassium: 4.1 mmol/L (ref 3.5–5.1)
Sodium: 142 mmol/L (ref 135–145)
TCO2: 30 mmol/L (ref 22–32)
pCO2, Ven: 46.4 mmHg (ref 44.0–60.0)
pH, Ven: 7.393 (ref 7.250–7.430)
pO2, Ven: 156 mmHg — ABNORMAL HIGH (ref 32.0–45.0)

## 2021-02-11 LAB — RESP PANEL BY RT-PCR (FLU A&B, COVID) ARPGX2
Influenza A by PCR: NEGATIVE
Influenza B by PCR: NEGATIVE
SARS Coronavirus 2 by RT PCR: NEGATIVE

## 2021-02-11 MED ORDER — METHYLPREDNISOLONE SODIUM SUCC 125 MG IJ SOLR
125.0000 mg | Freq: Once | INTRAMUSCULAR | Status: AC
  Administered 2021-02-11: 22:00:00 125 mg via INTRAVENOUS
  Filled 2021-02-11: qty 2

## 2021-02-11 MED ORDER — IPRATROPIUM-ALBUTEROL 0.5-2.5 (3) MG/3ML IN SOLN
3.0000 mL | Freq: Once | RESPIRATORY_TRACT | Status: AC
  Administered 2021-02-11: 22:00:00 3 mL via RESPIRATORY_TRACT
  Filled 2021-02-11: qty 3

## 2021-02-11 MED ORDER — ALBUTEROL SULFATE (2.5 MG/3ML) 0.083% IN NEBU
12.0000 mL | INHALATION_SOLUTION | Freq: Once | RESPIRATORY_TRACT | Status: AC
  Administered 2021-02-11: 22:00:00 12 mL via RESPIRATORY_TRACT
  Filled 2021-02-11: qty 12

## 2021-02-11 MED ORDER — MAGNESIUM SULFATE 2 GM/50ML IV SOLN
2.0000 g | Freq: Once | INTRAVENOUS | Status: AC
  Administered 2021-02-11: 22:00:00 2 g via INTRAVENOUS
  Filled 2021-02-11: qty 50

## 2021-02-11 NOTE — ED Triage Notes (Signed)
Pt states that she woke up and hour ago SOB, pt has asthma, audible wheezing across the room, speaking in short sentences, dry cough also having CP

## 2021-02-11 NOTE — ED Provider Notes (Signed)
[facility] EMERGENCY DEPARTMENT Provider Note   CSN: [id number] Arrival date & time: 02/11/21  2100     History  Chief Complaint  Patient presents with   Respiratory Distress    Rachel Nolan is a 51 y.o. female with history of asthma present emerged part with acute onset shortness of breath.  Patient ports onset began earlier today.  She is unable to provide further history due to her dyspnea.  He does have a history of asthma.  She denies history of intubation.  HPI     Home Medications Prior to Admission medications   Medication Sig Start Date End Date Taking? Authorizing Provider  albuterol (VENTOLIN HFA) 108 (90 Base) MCG/ACT inhaler Inhale 2 puffs into the lungs every 6 (six) hours as needed wheezing Patient not taking: No sig reported 08/29/20     amLODipine (NORVASC) 10 MG tablet Take 1 tablet (10 mg total) by mouth daily. Patient not taking: No sig reported 07/18/20   Sharma Covert, MD  FLUoxetine (PROZAC) 20 MG capsule Take 1 capsule (20 mg total) by mouth daily. 09/28/20   Rosezetta Schlatter, MD  FLUoxetine (PROZAC) 40 MG capsule Take 1 capsule (40 mg total) by mouth every morning. 11/26/20     gabapentin (NEURONTIN) 300 MG capsule Take 1 capsule (300 mg total) by mouth 3 (three) times daily. 11/26/20     ibuprofen (ADVIL) 200 MG tablet Take 400 mg by mouth every 6 (six) hours as needed for headache (pain).    [provider]  metoprolol succinate (TOPROL-XL) 25 MG 24 hr tablet Take 1 tablet (25 mg total) by mouth daily. 09/28/20   Rosezetta Schlatter, MD  nicotine (NICODERM CQ - DOSED IN MG/24 HOURS) 14 mg/24hr patch Place 1 patch (14 mg total) onto the skin daily. 09/27/20   Rosezetta Schlatter, MD  prazosin (MINIPRESS) 1 MG capsule Take 3 capsules (3 mg total) by mouth at bedtime. 11/26/20     QUEtiapine (SEROQUEL) 200 MG tablet Take 1 tablet (200 mg total) by mouth at bedtime. 09/27/20   Rosezetta Schlatter, MD  QUEtiapine (SEROQUEL) 300 MG tablet Take 2  tablets (600 mg total) by mouth at bedtime. 11/26/20     traZODone (DESYREL) 150 MG tablet Take 1 tablet (150 mg total) by mouth at bedtime. 11/26/20     citalopram (CELEXA) 20 MG tablet Take 1 tablet (20 mg total) by mouth daily. 03/07/19 07/23/19  Connye Burkitt, NP  lisinopril (ZESTRIL) 10 MG tablet Take 1 tablet (10 mg total) by mouth daily. 03/07/19 09/11/19  Connye Burkitt, NP  pantoprazole (PROTONIX) 40 MG tablet Take 1 tablet (40 mg total) by mouth daily. 03/07/19 07/23/19  Connye Burkitt, NP      Allergies    Patient has no known allergies.    Review of Systems   Review of Systems  Physical Exam Updated Vital Signs BP (!) 156/95    Pulse 85    Temp 98.3 F (36.8 C) (Oral)    Resp 18    LMP 06/19/2018 (Exact Date)    SpO2 100%  Physical Exam Constitutional:      General: She is not in acute distress. HENT:     Head: Normocephalic and atraumatic.  Eyes:     Conjunctiva/sclera: Conjunctivae normal.     Pupils: Pupils are equal, round, and reactive to light.  Cardiovascular:     Rate and Rhythm: Regular rhythm. Tachycardia present.  Pulmonary:     Effort: Pulmonary effort is normal.  Comments: Diffuse bilateral inspiratory next with very wheezing, speaking in short sentences, no hypoxia Abdominal:     General: There is no distension.     Tenderness: There is no abdominal tenderness.  Skin:    General: Skin is warm and dry.  Neurological:     General: No focal deficit present.     Mental Status: She is alert and oriented to person, place, and time. Mental status is at baseline.  Psychiatric:        Mood and Affect: Mood normal.    ED Results / Procedures / Treatments   Labs (all labs ordered are listed, but only abnormal results are displayed) Labs Reviewed  I-STAT VENOUS BLOOD GAS, ED - Abnormal; Notable for the following components:      Result Value   pO2, Ven 156.0 (*)    Bicarbonate 28.3 (*)    Acid-Base Excess 3.0 (*)    Calcium, Ion 0.97 (*)    All other  components within normal limits  RESP PANEL BY RT-PCR (FLU A&B, COVID) [id number]  BASIC METABOLIC PANEL  CBC WITH DIFFERENTIAL/PLATELET    EKG EKG Interpretation  Date/Time:  Wednesday February 11 2021 21:08:09 EST Ventricular Rate:  88 PR Interval:  150 QRS Duration: 80 QT Interval:  380 QTC Calculation: 459 R Axis:   67 Text Interpretation: Normal sinus rhythm with sinus arrhythmia When compared with ECG of 26-Sep-2020 16:10, PREVIOUS ECG IS PRESENTno significant changes Confirmed by Alvester Chou [phone]) on 02/12/2021 12:27:39 AM  Radiology DG Chest Portable 1 View  Result Date: 02/11/2021 CLINICAL DATA:  Shortness of breath.  Wheezing. EXAM: PORTABLE CHEST 1 VIEW COMPARISON:  09/18/2020 FINDINGS: The cardiomediastinal contours are normal. Mild bronchial thickening. Pulmonary vasculature is normal. No consolidation, pleural effusion, or pneumothorax. No acute osseous abnormalities are seen. Spinal fusion hardware partially visualized. IMPRESSION: Mild bronchial thickening, can be seen with asthma or bronchitis. Electronically Signed   By: Narda Rutherford M.D.   On: 02/11/2021 22:14    Procedures .Critical Care Performed by: Terald Sleeper, MD Authorized by: Terald Sleeper, MD   Critical care provider statement:    Critical care time (minutes):  45   Critical care time was exclusive of:  Separately billable procedures and treating other patients   Critical care was necessary to treat or prevent imminent or life-threatening deterioration of the following conditions:  Respiratory failure   Critical care was time spent personally by me on the following activities:  Ordering and performing treatments and interventions, ordering and review of laboratory studies, ordering and review of radiographic studies, pulse oximetry, review of old charts, examination of patient and evaluation of patient's response to treatment Comments:     BiPAP, reassessment of severe asthma exacerbation     Medications Ordered in ED Medications  ipratropium-albuterol (DUONEB) 0.5-2.5 (3) MG/3ML nebulizer solution 3 mL (3 mLs Nebulization Given 02/11/21 2156)  albuterol (PROVENTIL) (2.5 MG/3ML) 0.083% nebulizer solution 12 mL (12 mLs Nebulization Given 02/11/21 2218)  methylPREDNISolone sodium succinate (SOLU-MEDROL) 125 mg/2 mL injection 125 mg (125 mg Intravenous Given 02/11/21 2225)  magnesium sulfate IVPB 2 g 50 mL (0 g Intravenous Stopped 02/11/21 2324)    ED Course/ Medical Decision Making/ A&P Clinical Course as of 02/12/21 0028  Wed Feb 11, 2021  2217 Started on bipap, breathing more comfortably [MT]  2341 On reassessment patient is resting now, breathing much more comfortably on BiPAP.  Given the severity of her exacerbation initial presentation, she will require admission, would maintain on  BiPAP for now, likely can be weaned off later tonight or early tomorrow [MT]    Clinical Course User Index [MT] Florence Antonelli, Carola Rhine, MD                           Medical Decision Making Amount and/or Complexity of Data Reviewed Labs: ordered. Radiology: ordered.  Risk Prescription drug management. Decision regarding hospitalization.   This patient presents to the ED with concern for SOB, wheezing. This involves an extensive number of treatment options, and is a complaint that carries with it a high risk of complications and morbidity.  The differential diagnosis includes asthma most likely vs PNA vs viral illness vs other  I ordered and personally interpreted labs.  The pertinent results include:  CBC and BMP at baseline levels, VBG without acidosis, bicarb 28, co2 normal.  Covid/flu negative  I ordered imaging studies including dg chest I independently visualized and interpreted imaging which showed no focal infiltrates I agree with the radiologist interpretation  The patient was maintained on a cardiac monitor.  I personally viewed and interpreted the cardiac monitored which showed an  underlying rhythm of: sinus rhythm  Per my interpretation the patient's ECG shows sinus tachycardia, no acute ischemia or sig changes from prior tracing  I ordered medication including IV mag, steroids, nebulizers for asthma exacerbation I have reviewed the patients home medicines and have made adjustments as needed  Test Considered:  - Doubt PE clinically - did not feel CT PE needed at this time  After the interventions noted above, I reevaluated the patient and found that they have: improved   Dispostion:  After consideration of the diagnostic results and the patients response to treatment, I feel that the patent would benefit from admission.         Final Clinical Impression(s) / ED Diagnoses Final diagnoses:  Exacerbation of asthma, unspecified asthma severity, unspecified whether persistent    Rx / DC Orders ED Discharge Orders     None         Zarian Colpitts, Carola Rhine, MD 02/12/21 0028

## 2021-02-12 ENCOUNTER — Encounter (HOSPITAL_COMMUNITY): Payer: Self-pay | Admitting: Internal Medicine

## 2021-02-12 DIAGNOSIS — F1010 Alcohol abuse, uncomplicated: Secondary | ICD-10-CM

## 2021-02-12 DIAGNOSIS — F323 Major depressive disorder, single episode, severe with psychotic features: Secondary | ICD-10-CM

## 2021-02-12 DIAGNOSIS — F1410 Cocaine abuse, uncomplicated: Secondary | ICD-10-CM

## 2021-02-12 DIAGNOSIS — J45901 Unspecified asthma with (acute) exacerbation: Secondary | ICD-10-CM | POA: Diagnosis present

## 2021-02-12 DIAGNOSIS — F17210 Nicotine dependence, cigarettes, uncomplicated: Secondary | ICD-10-CM | POA: Diagnosis present

## 2021-02-12 LAB — CBC WITH DIFFERENTIAL/PLATELET
Abs Immature Granulocytes: 0.01 10*3/uL (ref 0.00–0.07)
Basophils Absolute: 0 10*3/uL (ref 0.0–0.1)
Basophils Relative: 1 %
Eosinophils Absolute: 0 10*3/uL (ref 0.0–0.5)
Eosinophils Relative: 1 %
HCT: 41.6 % (ref 36.0–46.0)
Hemoglobin: 13.8 g/dL (ref 12.0–15.0)
Immature Granulocytes: 0 %
Lymphocytes Relative: 13 %
Lymphs Abs: 0.6 10*3/uL — ABNORMAL LOW (ref 0.7–4.0)
MCH: 31.8 pg (ref 26.0–34.0)
MCHC: 33.2 g/dL (ref 30.0–36.0)
MCV: 95.9 fL (ref 80.0–100.0)
Monocytes Absolute: 0.1 10*3/uL (ref 0.1–1.0)
Monocytes Relative: 2 %
Neutro Abs: 3.8 10*3/uL (ref 1.7–7.7)
Neutrophils Relative %: 83 %
Platelets: 234 10*3/uL (ref 150–400)
RBC: 4.34 MIL/uL (ref 3.87–5.11)
RDW: 14.6 % (ref 11.5–15.5)
WBC: 4.4 10*3/uL (ref 4.0–10.5)
nRBC: 0 % (ref 0.0–0.2)

## 2021-02-12 LAB — COMPREHENSIVE METABOLIC PANEL
ALT: 11 U/L (ref 0–44)
AST: 14 U/L — ABNORMAL LOW (ref 15–41)
Albumin: 3.7 g/dL (ref 3.5–5.0)
Alkaline Phosphatase: 65 U/L (ref 38–126)
Anion gap: 8 (ref 5–15)
BUN: 10 mg/dL (ref 6–20)
CO2: 24 mmol/L (ref 22–32)
Calcium: 9.4 mg/dL (ref 8.9–10.3)
Chloride: 107 mmol/L (ref 98–111)
Creatinine, Ser: 0.64 mg/dL (ref 0.44–1.00)
GFR, Estimated: >60 mL/min (ref >60)
Glucose, Bld: 119 mg/dL — ABNORMAL HIGH (ref 70–99)
Potassium: 4.1 mmol/L (ref 3.5–5.1)
Sodium: 139 mmol/L (ref 135–145)
Total Bilirubin: 0.3 mg/dL (ref 0.3–1.2)
Total Protein: 7.2 g/dL (ref 6.5–8.1)

## 2021-02-12 LAB — MAGNESIUM: Magnesium: 2.6 mg/dL — ABNORMAL HIGH (ref 1.7–2.4)

## 2021-02-12 MED ORDER — METOPROLOL SUCCINATE ER 25 MG PO TB24
25.0000 mg | ORAL_TABLET | Freq: Every day | ORAL | Status: DC
  Administered 2021-02-12: 25 mg via ORAL
  Filled 2021-02-12: qty 1

## 2021-02-12 MED ORDER — LORAZEPAM 2 MG/ML IJ SOLN: 1.0000 mg | INTRAMUSCULAR | Status: DC | PRN

## 2021-02-12 MED ORDER — IPRATROPIUM-ALBUTEROL 0.5-2.5 (3) MG/3ML IN SOLN: 3.0000 mL | RESPIRATORY_TRACT | Status: DC | PRN

## 2021-02-12 MED ORDER — IPRATROPIUM-ALBUTEROL 0.5-2.5 (3) MG/3ML IN SOLN
3.0000 mL | Freq: Four times a day (QID) | RESPIRATORY_TRACT | Status: DC
  Administered 2021-02-12 (×3): 3 mL via RESPIRATORY_TRACT
  Filled 2021-02-12 (×3): qty 3

## 2021-02-12 MED ORDER — ENOXAPARIN SODIUM 40 MG/0.4ML IJ SOSY
40.0000 mg | PREFILLED_SYRINGE | INTRAMUSCULAR | Status: DC
  Administered 2021-02-12: 40 mg via SUBCUTANEOUS
  Filled 2021-02-12: qty 0.4

## 2021-02-12 MED ORDER — QUETIAPINE FUMARATE 300 MG PO TABS
600.0000 mg | ORAL_TABLET | Freq: Every day | ORAL | Status: DC
  Filled 2021-02-12 (×2): qty 2

## 2021-02-12 MED ORDER — LORAZEPAM 1 MG PO TABS: 1.0000 mg | ORAL_TABLET | ORAL | Status: DC | PRN

## 2021-02-12 MED ORDER — THIAMINE HCL 100 MG/ML IJ SOLN: 100.0000 mg | Freq: Every day | INTRAMUSCULAR | Status: DC

## 2021-02-12 MED ORDER — POLYETHYLENE GLYCOL 3350 17 G PO PACK: 17.0000 g | PACK | Freq: Every day | ORAL | Status: DC | PRN

## 2021-02-12 MED ORDER — FOLIC ACID 1 MG PO TABS
1.0000 mg | ORAL_TABLET | Freq: Every day | ORAL | Status: DC
  Administered 2021-02-12: 1 mg via ORAL
  Filled 2021-02-12: qty 1

## 2021-02-12 MED ORDER — ACETAMINOPHEN 650 MG RE SUPP: 650.0000 mg | Freq: Four times a day (QID) | RECTAL | Status: DC | PRN

## 2021-02-12 MED ORDER — PRAZOSIN HCL 1 MG PO CAPS
3.0000 mg | ORAL_CAPSULE | Freq: Every day | ORAL | Status: DC
  Filled 2021-02-12: qty 3

## 2021-02-12 MED ORDER — ONDANSETRON HCL 4 MG/2ML IJ SOLN: 4.0000 mg | Freq: Four times a day (QID) | INTRAMUSCULAR | Status: DC | PRN

## 2021-02-12 MED ORDER — GABAPENTIN 300 MG PO CAPS
300.0000 mg | ORAL_CAPSULE | Freq: Three times a day (TID) | ORAL | Status: DC
  Administered 2021-02-12 (×2): 300 mg via ORAL
  Filled 2021-02-12 (×2): qty 1

## 2021-02-12 MED ORDER — FLUOXETINE HCL 20 MG PO CAPS
40.0000 mg | ORAL_CAPSULE | Freq: Every morning | ORAL | Status: DC
  Administered 2021-02-12: 40 mg via ORAL
  Filled 2021-02-12: qty 2

## 2021-02-12 MED ORDER — THIAMINE HCL 100 MG PO TABS
100.0000 mg | ORAL_TABLET | Freq: Every day | ORAL | Status: DC
  Administered 2021-02-12: 100 mg via ORAL
  Filled 2021-02-12: qty 1

## 2021-02-12 MED ORDER — ONDANSETRON HCL 4 MG PO TABS: 4.0000 mg | ORAL_TABLET | Freq: Four times a day (QID) | ORAL | Status: DC | PRN

## 2021-02-12 MED ORDER — ACETAMINOPHEN 325 MG PO TABS: 650.0000 mg | ORAL_TABLET | Freq: Four times a day (QID) | ORAL | Status: DC | PRN

## 2021-02-12 MED ORDER — METHYLPREDNISOLONE SODIUM SUCC 125 MG IJ SOLR
60.0000 mg | Freq: Two times a day (BID) | INTRAMUSCULAR | Status: DC
  Administered 2021-02-12: 60 mg via INTRAVENOUS
  Filled 2021-02-12: qty 2

## 2021-02-12 MED ORDER — TRAZODONE HCL 50 MG PO TABS: 150.0000 mg | ORAL_TABLET | Freq: Every day | ORAL | Status: DC

## 2021-02-12 MED ORDER — ADULT MULTIVITAMIN W/MINERALS CH
1.0000 | ORAL_TABLET | Freq: Every day | ORAL | Status: DC
  Administered 2021-02-12: 1 via ORAL
  Filled 2021-02-12: qty 1

## 2021-02-12 MED ORDER — HYDRALAZINE HCL 20 MG/ML IJ SOLN: 10.0000 mg | Freq: Four times a day (QID) | INTRAMUSCULAR | Status: DC | PRN

## 2021-02-12 MED ORDER — ALBUTEROL SULFATE HFA 108 (90 BASE) MCG/ACT IN AERS
2.0000 | INHALATION_SPRAY | Freq: Four times a day (QID) | RESPIRATORY_TRACT | 2 refills | Status: DC | PRN
  Filled 2021-02-12: qty 8.5, 25d supply, fill #0

## 2021-02-12 MED ORDER — PREDNISONE 10 MG PO TABS
40.0000 mg | ORAL_TABLET | Freq: Every day | ORAL | 0 refills | Status: DC
  Filled 2021-02-12: qty 20, 5d supply, fill #0

## 2021-02-12 NOTE — Progress Notes (Signed)
Patient resting comfortably on 2L [place] with no respiratory distress noted.  Bipap currently not indicated at this time.  RT will continue to monitor and assess for bipap needs.

## 2021-02-12 NOTE — ED Notes (Signed)
Admitting doctor at the bedside 

## 2021-02-12 NOTE — Assessment & Plan Note (Signed)
·   Patient continues to abuse cocaine, last use was on 1/25.  Counseling on cessation

## 2021-02-12 NOTE — ED Notes (Signed)
Resp therapy has removed the bi-pap and the pt is eating   food  tolerating  so far

## 2021-02-12 NOTE — Discharge Summary (Signed)
Physician Discharge Summary  Rachel LandrySandra Nolan [id number] DOB: 20-Nov-1970 DOA: 02/11/2021  PCP: Pcp, No  Admit date: 02/11/2021 Discharge date: 02/13/2021  Admitted From: Home Disposition: Home.  Left AMA.  Recommendations for Outpatient Follow-up:  Follow up with PCP in 1-2 weeks   Discharge Condition: Fair CODE STATUS: Full code Diet recommendation: Regular diet.  Discharge summary:  51 year old with history of depressive disorder, mild intermittent asthma, ongoing smoker, ongoing cocaine use presented with sudden onset of severe shortness of breath, treated with BiPAP overnight with improvement and was on nasal cannula oxygen.  She had active wheezing and was getting treatment for asthma exacerbation.  Patient was on 2 L of oxygen and was gradually responding to bronchodilator therapy and IV steroids.  However, she stated some family emergency and need to go home right away.  She was still actively wheezing and needed to be monitored in the hospital.  Despite counseling, patient left AGAINST MEDICAL ADVICE.  As though she left AGAINST MEDICAL ADVICE, I prescribed her 5 days course of prednisone, prescribed her albuterol inhaler to use at home.  She was advised to report back as soon as possible if any worsening symptoms.  She was advised to seek help at nearest emergency room if any worsening shortness of breath.  Patient understood and left.   Discharge Diagnoses:  Principal Problem:   Acute asthma exacerbation Active Problems:   Major depressive disorder with psychotic features (HCC)   Cocaine abuse (HCC)   Alcohol abuse   Nicotine dependence, cigarettes, uncomplicated   Asthma exacerbation    Discharge Instructions   Allergies as of 02/12/2021   No Known Allergies      Medication List     TAKE these medications    albuterol 108 (90 Base) MCG/ACT inhaler Commonly known as: VENTOLIN HFA Inhale 2 puffs into the lungs every 6 (six) hours as needed. What changed:  reasons to take this   amLODipine 10 MG tablet Commonly known as: NORVASC Take 1 tablet (10 mg total) by mouth daily.   FLUoxetine 40 MG capsule Commonly known as: PROZAC Take 1 capsule (40 mg total) by mouth every morning. What changed: Another medication with the same name was removed. Continue taking this medication, and follow the directions you see here.   gabapentin 300 MG capsule Commonly known as: NEURONTIN Take 1 capsule (300 mg total) by mouth 3 (three) times daily.   ibuprofen 200 MG tablet Commonly known as: ADVIL Take 400 mg by mouth every 6 (six) hours as needed for headache (pain).   metoprolol succinate 25 MG 24 hr tablet Commonly known as: TOPROL-XL Take 1 tablet (25 mg total) by mouth daily.   nicotine 14 mg/24hr patch Commonly known as: NICODERM CQ - dosed in mg/24 hours Place 1 patch (14 mg total) onto the skin daily.   prazosin 1 MG capsule Commonly known as: MINIPRESS Take 3 capsules (3 mg total) by mouth at bedtime.   predniSONE 10 MG tablet Commonly known as: DELTASONE Take 4 tablets (40 mg total) by mouth daily with breakfast for 5 days.   QUEtiapine 200 MG tablet Commonly known as: SEROQUEL Take 1 tablet (200 mg total) by mouth at bedtime.   QUEtiapine 300 MG tablet Commonly known as: SEROQUEL Take 2 tablets (600 mg total) by mouth at bedtime.   traZODone 150 MG tablet Commonly known as: DESYREL Take 1 tablet (150 mg total) by mouth at bedtime.        No Known Allergies  Consultations: None  Procedures/Studies: DG Chest Portable 1 View  Result Date: 02/11/2021 CLINICAL DATA:  Shortness of breath.  Wheezing. EXAM: PORTABLE CHEST 1 VIEW COMPARISON:  09/18/2020 FINDINGS: The cardiomediastinal contours are normal. Mild bronchial thickening. Pulmonary vasculature is normal. No consolidation, pleural effusion, or pneumothorax. No acute osseous abnormalities are seen. Spinal fusion hardware partially visualized. IMPRESSION: Mild  bronchial thickening, can be seen with asthma or bronchitis. Electronically Signed   By: Rachel Nolan M.D.   On: 02/11/2021 22:14   (Echo, Carotid, EGD, Colonoscopy, ERCP)    Subjective: I had seen patient in the afternoon rounds.  I was called in the evening the patient.  Leave right away.  I discussed with nursing staff at the bedside and through their phone try to convince the patient to stay back and complete treatment.  Patient was very emotional and was not ready to give up conversation but to leave. Even though she left, I called prednisone and albuterol to her pharmacy.   Discharge Exam: Vitals:   02/12/21 1445 02/12/21 1500  BP: (!) 151/65 132/73  Pulse: (!) 58 (!) 57  Resp: 15 19  Temp:    SpO2: 97% 93%   Vitals:   02/12/21 1300 02/12/21 1330 02/12/21 1445 02/12/21 1500  BP: 118/75 126/83 (!) 151/65 132/73  Pulse: 70 (!) 59 (!) 58 (!) 57  Resp: 20 20 15 19   Temp:      TempSrc:      SpO2: 96% 100% 97% 93%   The results of significant diagnostics from this hospitalization (including imaging, microbiology, ancillary and laboratory) are listed below for reference.     Microbiology: Recent Results (from the past 240 hour(s))  Resp Panel by RT-PCR (Flu A&B, Covid) Nasopharyngeal Swab     Status: None   Collection Time: 02/11/21 10:02 PM   Specimen: Nasopharyngeal Swab; Nasopharyngeal(NP) swabs in vial transport medium  Result Value Ref Range Status   SARS Coronavirus 2 by RT PCR NEGATIVE NEGATIVE Final    Comment: (NOTE) SARS-CoV-2 target nucleic acids are NOT DETECTED.  The SARS-CoV-2 RNA is generally detectable in upper respiratory specimens during the acute phase of infection. The lowest concentration of SARS-CoV-2 viral copies this assay can detect is 138 copies/mL. A negative result does not preclude SARS-Cov-2 infection and should not be used as the sole basis for treatment or other patient management decisions. A negative result may occur with  improper  specimen collection/handling, submission of specimen other than nasopharyngeal swab, presence of viral mutation(s) within the areas targeted by this assay, and inadequate number of viral copies(<138 copies/mL). A negative result must be combined with clinical observations, patient history, and epidemiological information. The expected result is Negative.  Fact Sheet for Patients:  02/13/21  Fact Sheet for Healthcare Providers:  BloggerCourse.com  This test is no t yet approved or cleared by the [place] FDA and  has been authorized for detection and/or diagnosis of SARS-CoV-2 by FDA under an Emergency Use Authorization (EUA). This EUA will remain  in effect (meaning this test can be used) for the duration of the COVID-19 declaration under Section 564(b)(1) of the Act, 21 U.S.C.section 360bbb-3(b)(1), unless the authorization is terminated  or revoked sooner.       Influenza A by PCR NEGATIVE NEGATIVE Final   Influenza B by PCR NEGATIVE NEGATIVE Final    Comment: (NOTE) The Xpert Xpress SARS-CoV-2/FLU/RSV plus assay is intended as an aid in the diagnosis of influenza from Nasopharyngeal swab specimens and should not be used as a  sole basis for treatment. Nasal washings and aspirates are unacceptable for Xpert Xpress SARS-CoV-2/FLU/RSV testing.  Fact Sheet for Patients: BloggerCourse.com  Fact Sheet for Healthcare Providers: SeriousBroker.it  This test is not yet approved or cleared by the [place] FDA and has been authorized for detection and/or diagnosis of SARS-CoV-2 by FDA under an Emergency Use Authorization (EUA). This EUA will remain in effect (meaning this test can be used) for the duration of the COVID-19 declaration under Section 564(b)(1) of the Act, 21 U.S.C. section 360bbb-3(b)(1), unless the authorization is terminated or revoked.  Performed at  [facility], [address], [place], [place] [address]      Labs: BNP (last 3 results) No results for input(s): BNP in the last 8760 hours. Basic Metabolic Panel: Recent Labs  Lab 02/11/21 2200 02/11/21 2232 02/12/21 0304  NA 141 142 139  K 4.2 4.1 4.1  CL 106  --  107  CO2 25  --  24  GLUCOSE 86  --  119*  BUN 9  --  10  CREATININE 0.55  --  0.64  CALCIUM 9.3  --  9.4  MG  --   --  2.6*   Liver Function Tests: Recent Labs  Lab 02/12/21 0304  AST 14*  ALT 11  ALKPHOS 65  BILITOT 0.3  PROT 7.2  ALBUMIN 3.7   No results for input(s): LIPASE, AMYLASE in the last 168 hours. No results for input(s): AMMONIA in the last 168 hours. CBC: Recent Labs  Lab 02/11/21 2200 02/11/21 2232 02/12/21 0304  WBC 4.5  --  4.4  NEUTROABS 2.0  --  3.8  HGB 13.1 13.9 13.8  HCT 38.8 41.0 41.6  MCV 94.6  --  95.9  PLT 227  --  234   Cardiac Enzymes: No results for input(s): CKTOTAL, CKMB, CKMBINDEX, TROPONINI in the last 168 hours. BNP: Invalid input(s): POCBNP CBG: No results for input(s): GLUCAP in the last 168 hours. D-Dimer No results for input(s): DDIMER in the last 72 hours. Hgb A1c No results for input(s): HGBA1C in the last 72 hours. Lipid Profile No results for input(s): CHOL, HDL, LDLCALC, TRIG, CHOLHDL, LDLDIRECT in the last 72 hours. Thyroid function studies No results for input(s): TSH, T4TOTAL, T3FREE, THYROIDAB in the last 72 hours.  Invalid input(s): FREET3 Anemia work up No results for input(s): VITAMINB12, FOLATE, FERRITIN, TIBC, IRON, RETICCTPCT in the last 72 hours. Urinalysis    Component Value Date/Time   COLORURINE STRAW (A) 07/14/2020 0600   APPEARANCEUR CLEAR 07/14/2020 0600   LABSPEC 1.015 07/14/2020 0600   PHURINE 7.0 07/14/2020 0600   GLUCOSEU NEGATIVE 07/14/2020 0600   HGBUR NEGATIVE 07/14/2020 0600   BILIRUBINUR NEGATIVE 07/14/2020 0600   KETONESUR NEGATIVE 07/14/2020 0600   PROTEINUR NEGATIVE 07/14/2020 0600   NITRITE  NEGATIVE 07/14/2020 0600   LEUKOCYTESUR NEGATIVE 07/14/2020 0600   Sepsis Labs Invalid input(s): PROCALCITONIN,  WBC,  LACTICIDVEN Microbiology Recent Results (from the past 240 hour(s))  Resp Panel by RT-PCR (Flu A&B, Covid) Nasopharyngeal Swab     Status: None   Collection Time: 02/11/21 10:02 PM   Specimen: Nasopharyngeal Swab; Nasopharyngeal(NP) swabs in vial transport medium  Result Value Ref Range Status   SARS Coronavirus 2 by RT PCR NEGATIVE NEGATIVE Final    Comment: (NOTE) SARS-CoV-2 target nucleic acids are NOT DETECTED.  The SARS-CoV-2 RNA is generally detectable in upper respiratory specimens during the acute phase of infection. The lowest concentration of SARS-CoV-2 viral copies this assay can  detect is 138 copies/mL. A negative result does not preclude SARS-Cov-2 infection and should not be used as the sole basis for treatment or other patient management decisions. A negative result may occur with  improper specimen collection/handling, submission of specimen other than nasopharyngeal swab, presence of viral mutation(s) within the areas targeted by this assay, and inadequate number of viral copies(<138 copies/mL). A negative result must be combined with clinical observations, patient history, and epidemiological information. The expected result is Negative.  Fact Sheet for Patients:  BloggerCourse.comhttps://www.fda.gov/media/152166/download  Fact Sheet for Healthcare Providers:  SeriousBroker.ithttps://www.fda.gov/media/152162/download  This test is no t yet approved or cleared by the [place] FDA and  has been authorized for detection and/or diagnosis of SARS-CoV-2 by FDA under an Emergency Use Authorization (EUA). This EUA will remain  in effect (meaning this test can be used) for the duration of the COVID-19 declaration under Section 564(b)(1) of the Act, 21 U.S.C.section 360bbb-3(b)(1), unless the authorization is terminated  or revoked sooner.       Influenza A by PCR NEGATIVE  NEGATIVE Final   Influenza B by PCR NEGATIVE NEGATIVE Final    Comment: (NOTE) The Xpert Xpress SARS-CoV-2/FLU/RSV plus assay is intended as an aid in the diagnosis of influenza from Nasopharyngeal swab specimens and should not be used as a sole basis for treatment. Nasal washings and aspirates are unacceptable for Xpert Xpress SARS-CoV-2/FLU/RSV testing.  Fact Sheet for Patients: BloggerCourse.comhttps://www.fda.gov/media/152166/download  Fact Sheet for Healthcare Providers: SeriousBroker.ithttps://www.fda.gov/media/152162/download  This test is not yet approved or cleared by the [place] FDA and has been authorized for detection and/or diagnosis of SARS-CoV-2 by FDA under an Emergency Use Authorization (EUA). This EUA will remain in effect (meaning this test can be used) for the duration of the COVID-19 declaration under Section 564(b)(1) of the Act, 21 U.S.C. section 360bbb-3(b)(1), unless the authorization is terminated or revoked.  Performed at [facility], [address] [place], [place] [address]       SIGNED:   Dorcas CarrowKuber  Antolin, MD  [facility] 02/13/2021, 11:08 AM

## 2021-02-12 NOTE — Assessment & Plan Note (Signed)
?   Counseling on cessation daily ?

## 2021-02-12 NOTE — Assessment & Plan Note (Signed)
?   Patient has longstanding history of alcohol abuse ?? Patient is at high risk of impending alcohol withdrawal ?? Initiating CIWA protocol ?? Patient will be administered doses of intravenous benzodiazepines for evidence of withdrawal ? ?

## 2021-02-12 NOTE — ED Notes (Signed)
Admitting paged to RN per her request 

## 2021-02-12 NOTE — ED Notes (Signed)
Pt advised this RN that her son had been in an accident in [facility]; stated that she wanted to be discharged immediately to be w him & family. This RN paged admitting MD regarding same, notified by admitting that pt would be leaving AMA d/t not completing treatment. Pt advised of same & advised of need for admission d/t O2 need (on 2L at time of discharge), voiced understanding, signed AMA form. Pt appropriate prescriptions called in, pt A&O, NAD at discharge.

## 2021-02-12 NOTE — Assessment & Plan Note (Signed)
·   Patient presenting with rather rapid onset of shortness of breath wheezing and cough with minimal amounts of clear sputum.  Physical examination is seemingly consistent with acute asthma exacerbation  ER provider initiated BiPAP therapy to assist with work of breathing  We will keep patient on BiPAP therapy for now and slowly wean patient off of BiPAP as patient clinically improves.  Treat underlying asthma exacerbation with aggressive bronchodilator therapy and systemic steroids  Chest imaging reveals no evidence of concurrent pneumonia.

## 2021-02-12 NOTE — Assessment & Plan Note (Signed)
Continue home regimen of psychotropic medications 

## 2021-02-12 NOTE — Progress Notes (Signed)
Patient seen and examined.  She was at emergency room.  Admitted early morning hours by nighttime hospitalist.  51 year old with history of depressive disorder, mild intermittent asthma, ongoing cocaine, alcohol and nicotine use disorder presented with sudden onset of severe shortness of breath.  On BiPAP overnight.  Improved.  On examination, she was on 2 L oxygen and try to ambulate to the bathroom and started having severe bronchospasm and shortness of breath.  Acute asthma exacerbation: Continue aggressive bronchodilator therapy, IV steroids, inhalational steroids and nebulizer therapy.  Start mobilizing.  Chest.  Therapy.  BiPAP as needed however, she is mostly on nasal cannula oxygen and fairly controlled symptoms today all day.  We will transfer her to telemetry bed. Counseling to quit smoking, nicotine patch ordered. Counseled to quit cocaine use and alcohol use.   Same-day admission.  No charge visit.

## 2021-02-12 NOTE — H&P (Signed)
History and Physical    Rachel Nolan BZJ:[id number] DOB: 28-Oct-1970 DOA: 02/11/2021  PCP: Pcp, No  Patient coming from: Home   Chief Complaint:  Chief Complaint  Patient presents with   Respiratory Distress     HPI:    51 year old female with past medical history of major depressive disorder, mild intermittent asthma, cocaine abuse, benzodiazepine abuse disorder, alcohol abuse and history of multiple suicide attempts who presents to [facility] emergency department complaints of shortness of breath.  Patient explains that the evening of 1/26 she suddenly began to develop shortness of breath.  The shortness of breath is severe in intensity, worse with minimal exertion and improved with rest.  The shortness of breath is associated with intense wheezing and chest discomfort.  Patient describes the chest discomfort as sore in quality, moderate in intensity, located in the anterior chest, radiating diffusely and worse with deep inspiration or cough.  Patient denies fevers, sick contacts, recent travel or contact with confirmed COVID-19 infection.  Patient symptoms continued to rapidly worsen and patient eventually presented to [facility] emergency department for evaluation.  Upon evaluation in the emergency department patient was found to be in respiratory distress and placed on BiPAP therapy due to work of breathing.  Patient did clinically improve while on BiPAP therapy.  Clinically, patient was felt to be suffering from a asthma exacerbation and was administered both 125 mg of Solu-Medrol as well as several rounds of bronchodilator therapy.  Hospitalist group was then called to assess the patient for admission to the hospital.  Review of Systems:   Review of Systems  Respiratory:  Positive for shortness of breath and wheezing.   Cardiovascular:  Positive for chest pain.  All other systems reviewed and are negative.  Past Medical History:  Diagnosis Date   Anxiety     Asthma    Depression    Hypertension    Scoliosis     Past Surgical History:  Procedure Laterality Date   BACK SURGERY     ECTOPIC PREGNANCY SURGERY       reports that she has been smoking cigarettes. She has been smoking an average of 1 pack per day. She has never used smokeless tobacco. She reports current alcohol use. She reports current drug use. Drugs: Marijuana, Cocaine, and "Crack" cocaine.  No Known Allergies  Family History  Problem Relation Age of Onset   Hypertension Mother    Hypertension Father      Prior to Admission medications   Medication Sig Start Date End Date Taking? Authorizing Provider  albuterol (VENTOLIN HFA) 108 (90 Base) MCG/ACT inhaler Inhale 2 puffs into the lungs every 6 (six) hours as needed wheezing Patient not taking: No sig reported 08/29/20     amLODipine (NORVASC) 10 MG tablet Take 1 tablet (10 mg total) by mouth daily. Patient not taking: No sig reported 07/18/20   Antonieta Pert, MD  FLUoxetine (PROZAC) 20 MG capsule Take 1 capsule (20 mg total) by mouth daily. 09/28/20   Lamar Sprinkles, MD  FLUoxetine (PROZAC) 40 MG capsule Take 1 capsule (40 mg total) by mouth every morning. 11/26/20     gabapentin (NEURONTIN) 300 MG capsule Take 1 capsule (300 mg total) by mouth 3 (three) times daily. 11/26/20     ibuprofen (ADVIL) 200 MG tablet Take 400 mg by mouth every 6 (six) hours as needed for headache (pain).    [provider]  metoprolol succinate (TOPROL-XL) 25 MG 24 hr tablet Take 1 tablet (  25 mg total) by mouth daily. 09/28/20   Lamar Sprinklesosby, Courtney, MD  nicotine (NICODERM CQ - DOSED IN MG/24 HOURS) 14 mg/24hr patch Place 1 patch (14 mg total) onto the skin daily. 09/27/20   Lamar Sprinklesosby, Courtney, MD  prazosin (MINIPRESS) 1 MG capsule Take 3 capsules (3 mg total) by mouth at bedtime. 11/26/20     QUEtiapine (SEROQUEL) 200 MG tablet Take 1 tablet (200 mg total) by mouth at bedtime. 09/27/20   Lamar Sprinklesosby, Courtney, MD  QUEtiapine (SEROQUEL) 300 MG tablet  Take 2 tablets (600 mg total) by mouth at bedtime. 11/26/20     traZODone (DESYREL) 150 MG tablet Take 1 tablet (150 mg total) by mouth at bedtime. 11/26/20     citalopram (CELEXA) 20 MG tablet Take 1 tablet (20 mg total) by mouth daily. 03/07/19 07/23/19  Aldean BakerSykes, Janet E, NP  lisinopril (ZESTRIL) 10 MG tablet Take 1 tablet (10 mg total) by mouth daily. 03/07/19 09/11/19  Aldean BakerSykes, Janet E, NP  pantoprazole (PROTONIX) 40 MG tablet Take 1 tablet (40 mg total) by mouth daily. 03/07/19 07/23/19  Aldean BakerSykes, Janet E, NP    Physical Exam: Vitals:   02/12/21 0048 02/12/21 0130 02/12/21 0145 02/12/21 0200  BP:  (!) 131/96 (!) 131/94 127/90  Pulse:  66 73 66  Resp:  19 20 19   Temp:      TempSrc:      SpO2: 98% 99% 98% 99%    Constitutional: Awake alert and oriented x3, mild respiratory distress. Skin: no rashes, no lesions, good skin turgor noted. Eyes: Pupils are equally reactive to light.  No evidence of scleral icterus or conjunctival pallor.  ENMT: Moist mucous membranes noted.  Posterior pharynx clear of any exudate or lesions.   Neck: normal, supple, no masses, no thyromegaly.  No evidence of jugular venous distension.   Respiratory: Notable inspiratory and expiratory wheezing with scattered rhonchi bilaterally.  No evidence of rales.  Increased respiratory effort without accessory muscle use.  Cardiovascular: Regular rate and rhythm, no murmurs / rubs / gallops. No extremity edema. 2+ pedal pulses. No carotid bruits.  Chest:   Nontender without crepitus or deformity.   Back:   Nontender without crepitus or deformity. Abdomen: Abdomen is soft and nontender.  No evidence of intra-abdominal masses.  Positive bowel sounds noted in all quadrants.   Musculoskeletal: No joint deformity upper and lower extremities. Good ROM, no contractures. Normal muscle tone.  Neurologic: CN 2-12 grossly intact. Sensation intact.  Patient moving all 4 extremities spontaneously.  Patient is following all commands.  Patient is  responsive to verbal stimuli.   Psychiatric: Patient exhibits normal mood with appropriate affect.  Patient seems to possess insight as to their current situation.     Labs on Admission: I have personally reviewed following labs and imaging studies -   CBC: Recent Labs  Lab 02/11/21 2200 02/11/21 2232  WBC 4.5  --   NEUTROABS 2.0  --   HGB 13.1 13.9  HCT 38.8 41.0  MCV 94.6  --   PLT 227  --    Basic Metabolic Panel: Recent Labs  Lab 02/11/21 2200 02/11/21 2232  NA 141 142  K 4.2 4.1  CL 106  --   CO2 25  --   GLUCOSE 86  --   BUN 9  --   CREATININE 0.55  --   CALCIUM 9.3  --    GFR: CrCl cannot be calculated (Unknown ideal weight.). Liver Function Tests: No results for input(s): AST, ALT, ALKPHOS,  BILITOT, PROT, ALBUMIN in the last 168 hours. No results for input(s): LIPASE, AMYLASE in the last 168 hours. No results for input(s): AMMONIA in the last 168 hours. Coagulation Profile: No results for input(s): INR, PROTIME in the last 168 hours. Cardiac Enzymes: No results for input(s): CKTOTAL, CKMB, CKMBINDEX, TROPONINI in the last 168 hours. BNP (last 3 results) No results for input(s): PROBNP in the last 8760 hours. HbA1C: No results for input(s): HGBA1C in the last 72 hours. CBG: No results for input(s): GLUCAP in the last 168 hours. Lipid Profile: No results for input(s): CHOL, HDL, LDLCALC, TRIG, CHOLHDL, LDLDIRECT in the last 72 hours. Thyroid Function Tests: No results for input(s): TSH, T4TOTAL, FREET4, T3FREE, THYROIDAB in the last 72 hours. Anemia Panel: No results for input(s): VITAMINB12, FOLATE, FERRITIN, TIBC, IRON, RETICCTPCT in the last 72 hours. Urine analysis:    Component Value Date/Time   COLORURINE STRAW (A) 07/14/2020 0600   APPEARANCEUR CLEAR 07/14/2020 0600   LABSPEC 1.015 07/14/2020 0600   PHURINE 7.0 07/14/2020 0600   GLUCOSEU NEGATIVE 07/14/2020 0600   HGBUR NEGATIVE 07/14/2020 0600   BILIRUBINUR NEGATIVE 07/14/2020 0600    KETONESUR NEGATIVE 07/14/2020 0600   PROTEINUR NEGATIVE 07/14/2020 0600   NITRITE NEGATIVE 07/14/2020 0600   LEUKOCYTESUR NEGATIVE 07/14/2020 0600    Radiological Exams on Admission - Personally Reviewed: DG Chest Portable 1 View  Result Date: 02/11/2021 CLINICAL DATA:  Shortness of breath.  Wheezing. EXAM: PORTABLE CHEST 1 VIEW COMPARISON:  09/18/2020 FINDINGS: The cardiomediastinal contours are normal. Mild bronchial thickening. Pulmonary vasculature is normal. No consolidation, pleural effusion, or pneumothorax. No acute osseous abnormalities are seen. Spinal fusion hardware partially visualized. IMPRESSION: Mild bronchial thickening, can be seen with asthma or bronchitis. Electronically Signed   By: Narda Rutherford M.D.   On: 02/11/2021 22:14    EKG: Personally reviewed.  Rhythm is normal sinus rhythm with sinus arrhythmia with heart rate of 88 bpm.  No dynamic ST segment changes appreciated.  Assessment/Plan  * Acute asthma exacerbation- (present on admission) Patient presenting with rather rapid onset of shortness of breath wheezing and cough with minimal amounts of clear sputum. Physical examination is seemingly consistent with acute asthma exacerbation ER provider initiated BiPAP therapy to assist with work of breathing We will keep patient on BiPAP therapy for now and slowly wean patient off of BiPAP as patient clinically improves. Treat underlying asthma exacerbation with aggressive bronchodilator therapy and systemic steroids Chest imaging reveals no evidence of concurrent pneumonia.  Alcohol abuse- (present on admission) Patient has longstanding history of alcohol abuse Patient is at high risk of impending alcohol withdrawal Initiating CIWA protocol Patient will be administered doses of intravenous benzodiazepines for evidence of withdrawal   Cocaine abuse (HCC)- (present on admission) Patient continues to abuse cocaine, last use was on 1/25. Counseling on  cessation  Nicotine dependence, cigarettes, uncomplicated- (present on admission) Counseling on cessation daily  Major depressive disorder with psychotic features (HCC)- (present on admission) Continue home regimen of psychotropic medications       Code Status:  Full code  code status decision has been confirmed with: patient Family Communication: deferred   Status is: Observation  The patient remains OBS appropriate and will d/c before 2 midnights.       Marinda Elk MD [facility] Pager [phone]  If 7PM-7AM, please contact night-coverage [email or web address] Use universal Real password for that web site. If you do not have the password, please call the hospital operator.  02/12/2021, 2:51  AM

## 2021-02-13 ENCOUNTER — Other Ambulatory Visit: Payer: Self-pay

## 2021-02-19 ENCOUNTER — Other Ambulatory Visit: Payer: Self-pay

## 2021-02-23 ENCOUNTER — Inpatient Hospital Stay (HOSPITAL_COMMUNITY)
Admission: EM | Admit: 2021-02-23 | Discharge: 2021-02-26 | DRG: 202 | Disposition: A | Discharge status: Home / Self Care | Payer: Self-pay | Attending: Internal Medicine | Admitting: Internal Medicine

## 2021-02-23 ENCOUNTER — Other Ambulatory Visit: Payer: Self-pay

## 2021-02-23 ENCOUNTER — Emergency Department (HOSPITAL_COMMUNITY): Payer: Self-pay

## 2021-02-23 DIAGNOSIS — F333 Major depressive disorder, recurrent, severe with psychotic symptoms: Secondary | ICD-10-CM

## 2021-02-23 DIAGNOSIS — I10 Essential (primary) hypertension: Secondary | ICD-10-CM | POA: Diagnosis present

## 2021-02-23 DIAGNOSIS — J9601 Acute respiratory failure with hypoxia: Secondary | ICD-10-CM | POA: Diagnosis present

## 2021-02-23 DIAGNOSIS — R000 Tachycardia, unspecified: Secondary | ICD-10-CM | POA: Diagnosis present

## 2021-02-23 DIAGNOSIS — J45901 Unspecified asthma with (acute) exacerbation: Secondary | ICD-10-CM | POA: Diagnosis present

## 2021-02-23 DIAGNOSIS — F4310 Post-traumatic stress disorder, unspecified: Secondary | ICD-10-CM | POA: Diagnosis present

## 2021-02-23 DIAGNOSIS — F1010 Alcohol abuse, uncomplicated: Secondary | ICD-10-CM | POA: Diagnosis present

## 2021-02-23 DIAGNOSIS — Z9151 Personal history of suicidal behavior: Secondary | ICD-10-CM

## 2021-02-23 DIAGNOSIS — F419 Anxiety disorder, unspecified: Secondary | ICD-10-CM | POA: Diagnosis present

## 2021-02-23 DIAGNOSIS — F329 Major depressive disorder, single episode, unspecified: Secondary | ICD-10-CM | POA: Diagnosis present

## 2021-02-23 DIAGNOSIS — T461X6A Underdosing of calcium-channel blockers, initial encounter: Secondary | ICD-10-CM | POA: Diagnosis present

## 2021-02-23 DIAGNOSIS — Z79899 Other long term (current) drug therapy: Secondary | ICD-10-CM

## 2021-02-23 DIAGNOSIS — Y92009 Unspecified place in unspecified non-institutional (private) residence as the place of occurrence of the external cause: Secondary | ICD-10-CM

## 2021-02-23 DIAGNOSIS — Z91128 Patient's intentional underdosing of medication regimen for other reason: Secondary | ICD-10-CM

## 2021-02-23 DIAGNOSIS — Z8249 Family history of ischemic heart disease and other diseases of the circulatory system: Secondary | ICD-10-CM

## 2021-02-23 DIAGNOSIS — Z7952 Long term (current) use of systemic steroids: Secondary | ICD-10-CM

## 2021-02-23 DIAGNOSIS — T446X6A Underdosing of alpha-adrenoreceptor antagonists, initial encounter: Secondary | ICD-10-CM | POA: Diagnosis present

## 2021-02-23 DIAGNOSIS — F17210 Nicotine dependence, cigarettes, uncomplicated: Secondary | ICD-10-CM | POA: Diagnosis present

## 2021-02-23 DIAGNOSIS — Z720 Tobacco use: Secondary | ICD-10-CM | POA: Diagnosis present

## 2021-02-23 DIAGNOSIS — J4551 Severe persistent asthma with (acute) exacerbation: Principal | ICD-10-CM | POA: Diagnosis present

## 2021-02-23 DIAGNOSIS — F1410 Cocaine abuse, uncomplicated: Secondary | ICD-10-CM | POA: Diagnosis present

## 2021-02-23 DIAGNOSIS — Z20822 Contact with and (suspected) exposure to covid-19: Secondary | ICD-10-CM | POA: Diagnosis present

## 2021-02-23 DIAGNOSIS — J4521 Mild intermittent asthma with (acute) exacerbation: Secondary | ICD-10-CM

## 2021-02-23 LAB — CBC WITH DIFFERENTIAL/PLATELET
Abs Immature Granulocytes: 0.02 10*3/uL (ref 0.00–0.07)
Basophils Absolute: 0 10*3/uL (ref 0.0–0.1)
Basophils Relative: 1 %
Eosinophils Absolute: 0.3 10*3/uL (ref 0.0–0.5)
Eosinophils Relative: 6 %
HCT: 43.2 % (ref 36.0–46.0)
Hemoglobin: 14.4 g/dL (ref 12.0–15.0)
Immature Granulocytes: 0 %
Lymphocytes Relative: 44 %
Lymphs Abs: 2.5 10*3/uL (ref 0.7–4.0)
MCH: 31.8 pg (ref 26.0–34.0)
MCHC: 33.3 g/dL (ref 30.0–36.0)
MCV: 95.4 fL (ref 80.0–100.0)
Monocytes Absolute: 0.5 10*3/uL (ref 0.1–1.0)
Monocytes Relative: 8 %
Neutro Abs: 2.4 10*3/uL (ref 1.7–7.7)
Neutrophils Relative %: 41 %
Platelets: 207 10*3/uL (ref 150–400)
RBC: 4.53 MIL/uL (ref 3.87–5.11)
RDW: 14.5 % (ref 11.5–15.5)
WBC: 5.8 10*3/uL (ref 4.0–10.5)
nRBC: 0 % (ref 0.0–0.2)

## 2021-02-23 LAB — COMPREHENSIVE METABOLIC PANEL
ALT: 17 U/L (ref 0–44)
AST: 18 U/L (ref 15–41)
Albumin: 3.8 g/dL (ref 3.5–5.0)
Alkaline Phosphatase: 67 U/L (ref 38–126)
Anion gap: 9 (ref 5–15)
BUN: 10 mg/dL (ref 6–20)
CO2: 26 mmol/L (ref 22–32)
Calcium: 9.1 mg/dL (ref 8.9–10.3)
Chloride: 103 mmol/L (ref 98–111)
Creatinine, Ser: 0.78 mg/dL (ref 0.44–1.00)
GFR, Estimated: >60 mL/min (ref >60)
Glucose, Bld: 145 mg/dL — ABNORMAL HIGH (ref 70–99)
Potassium: 3.5 mmol/L (ref 3.5–5.1)
Sodium: 138 mmol/L (ref 135–145)
Total Bilirubin: 0.5 mg/dL (ref 0.3–1.2)
Total Protein: 7.2 g/dL (ref 6.5–8.1)

## 2021-02-23 LAB — BRAIN NATRIURETIC PEPTIDE: B Natriuretic Peptide: 14 pg/mL (ref 0.0–100.0)

## 2021-02-23 LAB — RESP PANEL BY RT-PCR (FLU A&B, COVID) ARPGX2
Influenza A by PCR: NEGATIVE
Influenza B by PCR: NEGATIVE
SARS Coronavirus 2 by RT PCR: NEGATIVE

## 2021-02-23 MED ORDER — ALBUTEROL SULFATE (2.5 MG/3ML) 0.083% IN NEBU
INHALATION_SOLUTION | RESPIRATORY_TRACT | Status: AC
  Filled 2021-02-23: qty 12

## 2021-02-23 MED ORDER — ACETAMINOPHEN 650 MG RE SUPP: 650.0000 mg | Freq: Four times a day (QID) | RECTAL | Status: DC | PRN

## 2021-02-23 MED ORDER — ALBUTEROL SULFATE (2.5 MG/3ML) 0.083% IN NEBU
2.5000 mg | INHALATION_SOLUTION | Freq: Once | RESPIRATORY_TRACT | Status: AC
  Administered 2021-02-23: 2.5 mg via RESPIRATORY_TRACT
  Filled 2021-02-23: qty 3

## 2021-02-23 MED ORDER — ADULT MULTIVITAMIN W/MINERALS CH
1.0000 | ORAL_TABLET | Freq: Every day | ORAL | Status: DC
  Administered 2021-02-23 – 2021-02-26 (×4): 1 via ORAL
  Filled 2021-02-23 (×4): qty 1

## 2021-02-23 MED ORDER — MAGNESIUM SULFATE 2 GM/50ML IV SOLN
2.0000 g | Freq: Once | INTRAVENOUS | Status: AC
  Administered 2021-02-23: 2 g via INTRAVENOUS
  Filled 2021-02-23: qty 50

## 2021-02-23 MED ORDER — LORAZEPAM 2 MG/ML IJ SOLN: 1.0000 mg | INTRAMUSCULAR | Status: DC | PRN

## 2021-02-23 MED ORDER — ALBUTEROL (5 MG/ML) CONTINUOUS INHALATION SOLN
10.0000 mg/h | INHALATION_SOLUTION | Freq: Once | RESPIRATORY_TRACT | Status: AC
  Administered 2021-02-23: 10 mg/h via RESPIRATORY_TRACT
  Filled 2021-02-23: qty 0.5

## 2021-02-23 MED ORDER — QUETIAPINE FUMARATE 200 MG PO TABS
200.0000 mg | ORAL_TABLET | Freq: Every day | ORAL | Status: DC
  Administered 2021-02-23 – 2021-02-25 (×3): 200 mg via ORAL
  Filled 2021-02-23 (×5): qty 1

## 2021-02-23 MED ORDER — IPRATROPIUM-ALBUTEROL 0.5-2.5 (3) MG/3ML IN SOLN
3.0000 mL | Freq: Four times a day (QID) | RESPIRATORY_TRACT | Status: DC
  Administered 2021-02-24 – 2021-02-26 (×10): 3 mL via RESPIRATORY_TRACT
  Filled 2021-02-23 (×11): qty 3

## 2021-02-23 MED ORDER — FOLIC ACID 1 MG PO TABS
1.0000 mg | ORAL_TABLET | Freq: Every day | ORAL | Status: DC
  Administered 2021-02-23 – 2021-02-26 (×4): 1 mg via ORAL
  Filled 2021-02-23 (×4): qty 1

## 2021-02-23 MED ORDER — LORAZEPAM 1 MG PO TABS
1.0000 mg | ORAL_TABLET | ORAL | Status: DC | PRN
  Administered 2021-02-23 – 2021-02-24 (×2): 1 mg via ORAL
  Filled 2021-02-23 (×2): qty 1

## 2021-02-23 MED ORDER — PRAZOSIN HCL 1 MG PO CAPS
1.0000 mg | ORAL_CAPSULE | Freq: Every day | ORAL | Status: DC
  Administered 2021-02-23 – 2021-02-25 (×3): 1 mg via ORAL
  Filled 2021-02-23 (×5): qty 1

## 2021-02-23 MED ORDER — ENOXAPARIN SODIUM 40 MG/0.4ML IJ SOSY
40.0000 mg | PREFILLED_SYRINGE | INTRAMUSCULAR | Status: DC
  Administered 2021-02-23 – 2021-02-25 (×3): 40 mg via SUBCUTANEOUS
  Filled 2021-02-23 (×3): qty 0.4

## 2021-02-23 MED ORDER — SODIUM CHLORIDE 0.9% FLUSH: 3.0000 mL | INTRAVENOUS | Status: DC | PRN

## 2021-02-23 MED ORDER — METHYLPREDNISOLONE SODIUM SUCC 125 MG IJ SOLR
125.0000 mg | Freq: Once | INTRAMUSCULAR | Status: AC
Start: 2021-02-23 — End: 2021-02-23
  Administered 2021-02-23: 125 mg via INTRAVENOUS
  Filled 2021-02-23: qty 2

## 2021-02-23 MED ORDER — THIAMINE HCL 100 MG PO TABS
100.0000 mg | ORAL_TABLET | Freq: Every day | ORAL | Status: DC
  Administered 2021-02-23 – 2021-02-26 (×4): 100 mg via ORAL
  Filled 2021-02-23 (×4): qty 1

## 2021-02-23 MED ORDER — AMLODIPINE BESYLATE 10 MG PO TABS
10.0000 mg | ORAL_TABLET | Freq: Every day | ORAL | Status: DC
  Administered 2021-02-23 – 2021-02-26 (×4): 10 mg via ORAL
  Filled 2021-02-23: qty 2
  Filled 2021-02-23 (×2): qty 1
  Filled 2021-02-23: qty 2

## 2021-02-23 MED ORDER — THIAMINE HCL 100 MG/ML IJ SOLN
100.0000 mg | Freq: Every day | INTRAMUSCULAR | Status: DC
  Filled 2021-02-23: qty 2

## 2021-02-23 MED ORDER — FLUOXETINE HCL 20 MG PO CAPS
40.0000 mg | ORAL_CAPSULE | Freq: Every morning | ORAL | Status: DC
  Administered 2021-02-24 – 2021-02-26 (×3): 40 mg via ORAL
  Filled 2021-02-23 (×3): qty 2

## 2021-02-23 MED ORDER — IPRATROPIUM-ALBUTEROL 0.5-2.5 (3) MG/3ML IN SOLN
3.0000 mL | Freq: Four times a day (QID) | RESPIRATORY_TRACT | Status: DC
  Administered 2021-02-23: 3 mL via RESPIRATORY_TRACT
  Filled 2021-02-23: qty 3

## 2021-02-23 MED ORDER — IPRATROPIUM-ALBUTEROL 0.5-2.5 (3) MG/3ML IN SOLN
3.0000 mL | Freq: Once | RESPIRATORY_TRACT | Status: AC
  Administered 2021-02-23: 3 mL via RESPIRATORY_TRACT
  Filled 2021-02-23: qty 3

## 2021-02-23 MED ORDER — METHYLPREDNISOLONE SODIUM SUCC 125 MG IJ SOLR: 120.0000 mg | INTRAMUSCULAR | Status: DC

## 2021-02-23 MED ORDER — SODIUM CHLORIDE 0.9% FLUSH
3.0000 mL | Freq: Two times a day (BID) | INTRAVENOUS | Status: DC
  Administered 2021-02-23 – 2021-02-26 (×6): 3 mL via INTRAVENOUS

## 2021-02-23 MED ORDER — NICOTINE 14 MG/24HR TD PT24
14.0000 mg | MEDICATED_PATCH | Freq: Every day | TRANSDERMAL | Status: DC
  Administered 2021-02-23 – 2021-02-26 (×4): 14 mg via TRANSDERMAL
  Filled 2021-02-23 (×4): qty 1

## 2021-02-23 MED ORDER — SODIUM CHLORIDE 0.9 % IV SOLN: 250.0000 mL | INTRAVENOUS | Status: DC | PRN

## 2021-02-23 MED ORDER — ALBUTEROL SULFATE (2.5 MG/3ML) 0.083% IN NEBU: 2.5000 mg | INHALATION_SOLUTION | RESPIRATORY_TRACT | Status: DC | PRN

## 2021-02-23 MED ORDER — MONTELUKAST SODIUM 10 MG PO TABS
10.0000 mg | ORAL_TABLET | Freq: Every day | ORAL | Status: DC
  Administered 2021-02-23 – 2021-02-25 (×3): 10 mg via ORAL
  Filled 2021-02-23 (×4): qty 1

## 2021-02-23 MED ORDER — ACETAMINOPHEN 325 MG PO TABS
650.0000 mg | ORAL_TABLET | Freq: Four times a day (QID) | ORAL | Status: DC | PRN
  Administered 2021-02-25: 650 mg via ORAL
  Filled 2021-02-23: qty 2

## 2021-02-23 MED ORDER — HYDRALAZINE HCL 20 MG/ML IJ SOLN: 5.0000 mg | Freq: Four times a day (QID) | INTRAMUSCULAR | Status: DC | PRN

## 2021-02-23 NOTE — H&P (Signed)
History and Physical    Patient: Rachel Nolan IZT:[id number] DOB: 1971/01/03 DOA: 02/23/2021 DOS: the patient was seen and examined on 02/23/2021 PCP: Pcp, No  Patient coming from: Home - lives with a roommate    Chief Complaint: shortness of breath   HPI: [name] is a 51 y.o. female with medical history significant of major depressive disorder/PTSD, mild intermittent asthma, cocaine abuse, benzodiazepine abuse disorder, alcohol abuse and history of multiple suicide attempts who presents to [facility] emergency department complaints of shortness of breath. She states it started yesterday. She states she is short of breath with exertion and even with sitting and eating. Her albuterol inhaler was not working. She states all she has at home is her inhaler. She has not been hospitalized for her asthma in many, many years. Never intubated. Denies sick contacts. She thinks she his taking the steroids given at discharge a few days ago, but can't confirm this.   She was here on 1/25 for asthma exacerbation, but left AMA for a family emergency. When she left AMA on 1/26 she had been weaned off bipap and was still on 2L [place].   No fever/chills, no vision changes, chest pain or palpitations, +cough that is dry, no abdominal pain, N/V/D, no dysuria and no leg swelling.   She does smoke, +alcohol (40 oz. Day), crack cocaine + on Saturday.  No SI/HI/AH/VH   ER Course:  vitals: afebrile, bp: 153/108, HR: 93, RR: 20 oxygen: 91% RA Pertinent labs: none covid/flu negative  CXR: no opacity/pneumonia. Right atelectasis right base In Ed: continuous neb, duoneb, magnesium and solumedrol     Review of Systems: As mentioned in the history of present illness. All other systems reviewed and are negative. Past Medical History:  Diagnosis Date   Anxiety    Asthma    Depression    Hypertension    Scoliosis    Past Surgical History:  Procedure Laterality Date   BACK SURGERY     ECTOPIC  PREGNANCY SURGERY     Social History:  reports that she has been smoking cigarettes. She has been smoking an average of 1 pack per day. She has never used smokeless tobacco. She reports current alcohol use. She reports current drug use. Drugs: Marijuana, Cocaine, and "Crack" cocaine.  No Known Allergies  Family History  Problem Relation Age of Onset   Hypertension Mother    Hypertension Father     Prior to Admission medications   Medication Sig Start Date End Date Taking? Authorizing Provider  albuterol (VENTOLIN HFA) 108 (90 Base) MCG/ACT inhaler Inhale 2 puffs into the lungs every 6 (six) hours as needed. Patient taking differently: Inhale 2 puffs into the lungs every 6 (six) hours as needed for wheezing or shortness of breath. 02/12/21  Yes Dorcas Carrow, MD  ibuprofen (ADVIL) 200 MG tablet Take 400 mg by mouth every 6 (six) hours as needed for headache (pain).   Yes [provider]  predniSONE (DELTASONE) 10 MG tablet Take 4 tablets (40 mg total) by mouth daily with breakfast for 5 days. 02/12/21 02/24/21 Yes Ghimire, Lyndel Safe, MD  amLODipine (NORVASC) 10 MG tablet Take 1 tablet (10 mg total) by mouth daily. Patient not taking: Reported on 02/23/2021 07/18/20   Antonieta Pert, MD  FLUoxetine (PROZAC) 40 MG capsule Take 1 capsule (40 mg total) by mouth every morning. Patient not taking: Reported on 02/23/2021 11/26/20     gabapentin (NEURONTIN) 300 MG capsule Take 1 capsule (300 mg total) by  mouth 3 (three) times daily. Patient not taking: Reported on 02/23/2021 11/26/20     metoprolol succinate (TOPROL-XL) 25 MG 24 hr tablet Take 1 tablet (25 mg total) by mouth daily. Patient not taking: Reported on 02/23/2021 09/28/20   Lamar Sprinkles, MD  nicotine (NICODERM CQ - DOSED IN MG/24 HOURS) 14 mg/24hr patch Place 1 patch (14 mg total) onto the skin daily. Patient not taking: Reported on 02/23/2021 09/27/20   Lamar Sprinkles, MD  prazosin (MINIPRESS) 1 MG capsule Take 3 capsules (3 mg total) by  mouth at bedtime. Patient not taking: Reported on 02/23/2021 11/26/20     QUEtiapine (SEROQUEL) 200 MG tablet Take 1 tablet (200 mg total) by mouth at bedtime. Patient not taking: Reported on 02/23/2021 09/27/20   Lamar Sprinkles, MD  QUEtiapine (SEROQUEL) 300 MG tablet Take 2 tablets (600 mg total) by mouth at bedtime. Patient not taking: Reported on 02/23/2021 11/26/20     traZODone (DESYREL) 150 MG tablet Take 1 tablet (150 mg total) by mouth at bedtime. Patient not taking: Reported on 02/23/2021 11/26/20     citalopram (CELEXA) 20 MG tablet Take 1 tablet (20 mg total) by mouth daily. 03/07/19 07/23/19  Aldean Baker, NP  lisinopril (ZESTRIL) 10 MG tablet Take 1 tablet (10 mg total) by mouth daily. 03/07/19 09/11/19  Aldean Baker, NP  pantoprazole (PROTONIX) 40 MG tablet Take 1 tablet (40 mg total) by mouth daily. 03/07/19 07/23/19  Aldean Baker, NP    Physical Exam: Vitals:   02/23/21 1445 02/23/21 1516 02/23/21 1547 02/23/21 1624  BP: 132/86 (!) 137/95 (!) 145/93 (!) 133/92  Pulse: 87 88 83 88  Resp:  17 18 18   Temp:      TempSrc:      SpO2: 97% 99% 97% 97%   General:  Appears calm and comfortable and is in NAD Eyes:  PERRL, EOMI, normal lids, iris ENT:  grossly normal hearing, lips & tongue, mmm; appropriate dentition Neck:  no LAD, masses or thyromegaly; no carotid bruits Cardiovascular:  RRR, no m/r/g. No LE edema.  Respiratory:   scattered rhonchi. No wheezing.  Normal respiratory effort. Abdomen:  soft, NT, ND, NABS Back:   normal alignment, no CVAT Skin:  no rash or induration seen on limited exam Musculoskeletal:  grossly normal tone BUE/BLE, good ROM, no bony abnormality Lower extremity:  No LE edema.  Limited foot exam with no ulcerations.  2+ distal pulses. Psychiatric:  grossly normal mood and affect, speech fluent and appropriate, Aox3. Denies any si/hi.  Neurologic:  CN 2-12 grossly intact, moves all extremities in coordinated fashion, sensation intact   Radiological Exams on  Admission: Independently reviewed - see discussion in A/P where applicable  DG Chest Portable 1 View  Result Date: 02/23/2021 CLINICAL DATA:  Dyspnea, cough, history asthma, smoker EXAM: PORTABLE CHEST 1 VIEW COMPARISON:  Portable exam 1208 hours compared to 02/11/2021 FINDINGS: Normal heart size, mediastinal contours, and pulmonary vascularity. Subsegmental atelectasis RIGHT lung base. Chronic accentuation of RIGHT infrahilar markings. No definite acute infiltrate, pleural effusion or pneumothorax. Thoracolumbar scoliosis with single Harrington rod noted. IMPRESSION: Subsegmental atelectasis RIGHT lung base. Electronically Signed   By: Ulyses Southward M.D.   On: 02/23/2021 12:17    EKG: Independently reviewed.  Sinus arrhythmia with rate 75; nonspecific ST changes with no evidence of acute ischemia   Labs on Admission: I have personally reviewed the available labs and imaging studies at the time of the admission.  Pertinent labs:   None, uds  pending     Assessment and Plan: * Asthma exacerbation- (present on admission) 51 year old presenting with worsening shortness of breath, retractions and increased work of breathing at 91% on RA who recently left AMA for an asthma exacerbation requiring BiPAP at that time.  -place in obs on telemetry -no respiratory failure or hypoxia -no acute findings on CXR -continue scheduled duonebs, albuterol prn and steroids IV -starting singulair at night -encouraged smoking cessation and discussed cocaine can injure the lung.   Essential hypertension Non compliant with medication Starting back her novasc. Not ordering her toprol and would not use this an anti hypertensive choice with cocaine abuse  Hydralazine prn for elevated pressures   Alcohol abuse- (present on admission) Everyday drinker with high risk of withdrawal CIWA protocol with ativan as needed and frequent vitals MV, thiamine, folic acid sw consult   Cocaine abuse (HCC)- (present on  admission) On going abuse, last used Saturday Encouraged cessation   MDD (major depressive disorder)- (present on admission) Has been out of her medication for over a month starting back her prozac, seroquel  Post traumatic stress disorder (PTSD)- (present on admission) Not taking her prazosin. Starting back at 1mg  SW consult for psychiatry follow up   Tobacco abuse- (present on admission) Nicotine patch, encouraged cessation     Advance Care Planning:   Code Status: Full Code   Consults: none  DVT Prophylaxis: lovenox  Family Communication: none  Severity of Illness: The appropriate patient status for this patient is OBSERVATION. Observation status is judged to be reasonable and necessary in order to provide the required intensity of service to ensure the patient's safety. The patient's presenting symptoms, physical exam findings, and initial radiographic and laboratory data in the context of their medical condition is felt to place them at decreased risk for further clinical deterioration. Furthermore, it is anticipated that the patient will be medically stable for discharge from the hospital within 2 midnights of admission.   Author: , MD 02/23/2021 6:53 PM  For on call review [email or web address].

## 2021-02-23 NOTE — Assessment & Plan Note (Signed)
Has been out of her medication for over a month starting back her prozac, seroquel

## 2021-02-23 NOTE — Assessment & Plan Note (Signed)
Everyday drinker with high risk of withdrawal CIWA protocol with ativan as needed and frequent vitals MV, thiamine, folic acid sw consult

## 2021-02-23 NOTE — ED Notes (Signed)
RT at bedside, placed pt on albuterol inhaler

## 2021-02-23 NOTE — Assessment & Plan Note (Signed)
On going abuse, last used Saturday Encouraged cessation

## 2021-02-23 NOTE — Assessment & Plan Note (Addendum)
51 year old presenting with worsening shortness of breath, retractions and increased work of breathing at 91% on RA who recently left AMA for an asthma exacerbation requiring BiPAP at that time.  -place in obs on telemetry -no respiratory failure or hypoxia -no acute findings on CXR -continue scheduled duonebs, albuterol prn and steroids IV -starting singulair at night -encouraged smoking cessation and discussed cocaine can injure the lung.

## 2021-02-23 NOTE — ED Triage Notes (Signed)
Pt here for shob w/ audible wheezing that started approx 1 hr prior to arrival. Pt states she 's had shob on and off, and was told to follow up to see if she had COPD, pt hasn't been able to get in. Pt hx of asthma, using albuterol inhaler w no relief. Pt does not have nebulizer machine at home. Reports chest tightness

## 2021-02-23 NOTE — Assessment & Plan Note (Signed)
Non compliant with medication Starting back her novasc. Not ordering her toprol and would not use this an anti hypertensive choice with cocaine abuse  Hydralazine prn for elevated pressures

## 2021-02-23 NOTE — Assessment & Plan Note (Signed)
Not taking her prazosin. Starting back at 1mg  SW consult for psychiatry follow up

## 2021-02-23 NOTE — Assessment & Plan Note (Signed)
Nicotine patch, encouraged cessation 

## 2021-02-23 NOTE — ED Provider Notes (Signed)
[facility] EMERGENCY DEPARTMENT Provider Note   CSN: [id number] Arrival date & time: 02/23/21  1136     History  Chief Complaint  Patient presents with   Shortness of Breath    Rachel Nolan is a 51 y.o. female.  HPI 51 year old female presents with respiratory distress.  She was here on 1/25 for similar type shortness of breath.  She states she has been short of breath ever since leaving the hospital on 1/26.  It has been progressively worsening since and was severe today.  No fevers.  She had chest tightness but no chest pain.  She has a cough with minimally productive sputum.  She feels very short of breath.  She has an albuterol inhaler but states it did not do anything.  She arrives in respiratory distress though after a few minutes of sitting down talking to her it does seem like her work of breathing is slightly improving with rest.  Home Medications Prior to Admission medications   Medication Sig Start Date End Date Taking? Authorizing Provider  albuterol (VENTOLIN HFA) 108 (90 Base) MCG/ACT inhaler Inhale 2 puffs into the lungs every 6 (six) hours as needed. Patient taking differently: Inhale 2 puffs into the lungs every 6 (six) hours as needed for wheezing or shortness of breath. 02/12/21  Yes Dorcas Carrow, MD  ibuprofen (ADVIL) 200 MG tablet Take 400 mg by mouth every 6 (six) hours as needed for headache (pain).   Yes [provider]  predniSONE (DELTASONE) 10 MG tablet Take 4 tablets (40 mg total) by mouth daily with breakfast for 5 days. 02/12/21 02/24/21 Yes Ghimire, Lyndel Safe, MD  amLODipine (NORVASC) 10 MG tablet Take 1 tablet (10 mg total) by mouth daily. Patient not taking: Reported on 02/23/2021 07/18/20   Antonieta Pert, MD  FLUoxetine (PROZAC) 40 MG capsule Take 1 capsule (40 mg total) by mouth every morning. Patient not taking: Reported on 02/23/2021 11/26/20     gabapentin (NEURONTIN) 300 MG capsule Take 1 capsule (300 mg total) by mouth 3  (three) times daily. Patient not taking: Reported on 02/23/2021 11/26/20     metoprolol succinate (TOPROL-XL) 25 MG 24 hr tablet Take 1 tablet (25 mg total) by mouth daily. Patient not taking: Reported on 02/23/2021 09/28/20   Lamar Sprinkles, MD  nicotine (NICODERM CQ - DOSED IN MG/24 HOURS) 14 mg/24hr patch Place 1 patch (14 mg total) onto the skin daily. Patient not taking: Reported on 02/23/2021 09/27/20   Lamar Sprinkles, MD  prazosin (MINIPRESS) 1 MG capsule Take 3 capsules (3 mg total) by mouth at bedtime. Patient not taking: Reported on 02/23/2021 11/26/20     QUEtiapine (SEROQUEL) 200 MG tablet Take 1 tablet (200 mg total) by mouth at bedtime. Patient not taking: Reported on 02/23/2021 09/27/20   Lamar Sprinkles, MD  QUEtiapine (SEROQUEL) 300 MG tablet Take 2 tablets (600 mg total) by mouth at bedtime. Patient not taking: Reported on 02/23/2021 11/26/20     traZODone (DESYREL) 150 MG tablet Take 1 tablet (150 mg total) by mouth at bedtime. Patient not taking: Reported on 02/23/2021 11/26/20     citalopram (CELEXA) 20 MG tablet Take 1 tablet (20 mg total) by mouth daily. 03/07/19 07/23/19  Aldean Baker, NP  lisinopril (ZESTRIL) 10 MG tablet Take 1 tablet (10 mg total) by mouth daily. 03/07/19 09/11/19  Aldean Baker, NP  pantoprazole (PROTONIX) 40 MG tablet Take 1 tablet (40 mg total) by mouth daily. 03/07/19 07/23/19  Aldean Baker,  NP      Allergies    Patient has no known allergies.    Review of Systems   Review of Systems  Constitutional:  Negative for fever.  Respiratory:  Positive for cough, chest tightness and shortness of breath.   Cardiovascular:  Negative for chest pain and leg swelling.   Physical Exam Updated Vital Signs BP (!) 145/93    Pulse 83    Temp 97.7 F (36.5 C) (Oral)    Resp 18    LMP 06/19/2018 (Exact Date)    SpO2 97%  Physical Exam Vitals and nursing note reviewed.  Constitutional:      Appearance: She is well-developed. She is ill-appearing. She is not diaphoretic.   HENT:     Head: Normocephalic and atraumatic.  Cardiovascular:     Rate and Rhythm: Normal rate and regular rhythm.     Heart sounds: Normal heart sounds.  Pulmonary:     Effort: Tachypnea and accessory muscle usage present. No prolonged expiration or respiratory distress.     Breath sounds: Wheezing (diffuse, expiratory) present.  Abdominal:     Palpations: Abdomen is soft.     Tenderness: There is no abdominal tenderness.  Skin:    General: Skin is warm and dry.  Neurological:     Mental Status: She is alert.    ED Results / Procedures / Treatments   Labs (all labs ordered are listed, but only abnormal results are displayed) Labs Reviewed  COMPREHENSIVE METABOLIC PANEL - Abnormal; Notable for the following components:      Result Value   Glucose, Bld 145 (*)    All other components within normal limits  RESP PANEL BY RT-PCR (FLU A&B, COVID) ARPGX2  CBC WITH DIFFERENTIAL/PLATELET  BRAIN NATRIURETIC PEPTIDE    EKG EKG Interpretation  Date/Time:  Monday February 23 2021 11:58:38 EST Ventricular Rate:  75 PR Interval:  165 QRS Duration: 90 QT Interval:  418 QTC Calculation: 467 R Axis:   55 Text Interpretation: Sinus arrhythmia Consider left atrial enlargement no acute ST/T changes Confirmed by Pricilla Loveless [phone]) on 02/23/2021 12:22:03 PM  Radiology DG Chest Portable 1 View  Result Date: 02/23/2021 CLINICAL DATA:  Dyspnea, cough, history asthma, smoker EXAM: PORTABLE CHEST 1 VIEW COMPARISON:  Portable exam 1208 hours compared to 02/11/2021 FINDINGS: Normal heart size, mediastinal contours, and pulmonary vascularity. Subsegmental atelectasis RIGHT lung base. Chronic accentuation of RIGHT infrahilar markings. No definite acute infiltrate, pleural effusion or pneumothorax. Thoracolumbar scoliosis with single Harrington rod noted. IMPRESSION: Subsegmental atelectasis RIGHT lung base. Electronically Signed   By: Ulyses Southward M.D.   On: 02/23/2021 12:17     Procedures .Critical Care Performed by: Pricilla Loveless, MD Authorized by: Pricilla Loveless, MD   Critical care provider statement:    Critical care time (minutes):  35   Critical care time was exclusive of:  Separately billable procedures and treating other patients   Critical care was necessary to treat or prevent imminent or life-threatening deterioration of the following conditions:  Respiratory failure   Critical care was time spent personally by me on the following activities:  Development of treatment plan with patient or surrogate, discussions with consultants, evaluation of patient's response to treatment, examination of patient, ordering and review of laboratory studies, ordering and review of radiographic studies, ordering and performing treatments and interventions, pulse oximetry, re-evaluation of patient's condition and review of old charts    Medications Ordered in ED Medications  albuterol (PROVENTIL) (2.5 MG/3ML) 0.083% nebulizer solution (  Not Given 02/23/21 1158)  albuterol (PROVENTIL) (2.5 MG/3ML) 0.083% nebulizer solution 2.5 mg (has no administration in time range)  ipratropium-albuterol (DUONEB) 0.5-2.5 (3) MG/3ML nebulizer solution 3 mL (has no administration in time range)  albuterol (PROVENTIL,VENTOLIN) solution continuous neb (10 mg/hr Nebulization Given 02/23/21 1158)  magnesium sulfate IVPB 2 g 50 mL (0 g Intravenous Stopped 02/23/21 1300)  methylPREDNISolone sodium succinate (SOLU-MEDROL) 125 mg/2 mL injection 125 mg (125 mg Intravenous Given 02/23/21 1159)    ED Course/ Medical Decision Making/ A&P                           Medical Decision Making Amount and/or Complexity of Data Reviewed Labs: ordered. Radiology: ordered.  Risk Prescription drug management. Decision regarding hospitalization.   Patient presents with recurrent asthma exacerbation.  There is a question previously of whether this is COPD.  I reviewed her most recent discharge summary which  show she had a similar presentation but at that time was requiring BiPAP and then left next morning AMA.  She does endorse some cocaine use recently in the last couple days.  However overall this seems to be a recurrent asthma exacerbation given the significant wheezing.  She has made a marked improvement with the continuous albuterol, Solu-Medrol, and magnesium.  It seems like based on what she is saying she did not actually take the prednisone as prescribed last time.  I have reviewed her labs which showed no significant abnormalities including normal WBC.  Her x-ray shows a possible infiltrate but probably is atelectasis on my view.  Otherwise, ECG shows no acute ischemia on my read.  While she has clinically improved, she is still wheezy and short of breath. She will need admission.  I discussed hospitalist, Dr. Artis Flock.         Final Clinical Impression(s) / ED Diagnoses Final diagnoses:  Severe persistent asthma with exacerbation    Rx / DC Orders ED Discharge Orders     None         Pricilla Loveless, MD 02/23/21 1655

## 2021-02-24 ENCOUNTER — Encounter (HOSPITAL_COMMUNITY): Payer: Self-pay | Admitting: Family Medicine

## 2021-02-24 DIAGNOSIS — F1410 Cocaine abuse, uncomplicated: Secondary | ICD-10-CM

## 2021-02-24 DIAGNOSIS — I10 Essential (primary) hypertension: Secondary | ICD-10-CM

## 2021-02-24 DIAGNOSIS — F1010 Alcohol abuse, uncomplicated: Secondary | ICD-10-CM

## 2021-02-24 LAB — CBC
HCT: 36.6 % (ref 36.0–46.0)
Hemoglobin: 12.7 g/dL (ref 12.0–15.0)
MCH: 32.6 pg (ref 26.0–34.0)
MCHC: 34.7 g/dL (ref 30.0–36.0)
MCV: 93.8 fL (ref 80.0–100.0)
Platelets: 206 10*3/uL (ref 150–400)
RBC: 3.9 MIL/uL (ref 3.87–5.11)
RDW: 14.6 % (ref 11.5–15.5)
WBC: 6.6 10*3/uL (ref 4.0–10.5)
nRBC: 0 % (ref 0.0–0.2)

## 2021-02-24 LAB — BASIC METABOLIC PANEL
Anion gap: 9 (ref 5–15)
BUN: 11 mg/dL (ref 6–20)
CO2: 27 mmol/L (ref 22–32)
Calcium: 9.3 mg/dL (ref 8.9–10.3)
Chloride: 104 mmol/L (ref 98–111)
Creatinine, Ser: 0.65 mg/dL (ref 0.44–1.00)
GFR, Estimated: >60 mL/min (ref >60)
Glucose, Bld: 118 mg/dL — ABNORMAL HIGH (ref 70–99)
Potassium: 4 mmol/L (ref 3.5–5.1)
Sodium: 140 mmol/L (ref 135–145)

## 2021-02-24 LAB — RAPID URINE DRUG SCREEN, HOSP PERFORMED
Amphetamines: NEGATIVE — AB
Barbiturates: NEGATIVE — AB
Benzodiazepines: NEGATIVE — AB
Cocaine: POSITIVE — AB
Opiates: NEGATIVE — AB
Tetrahydrocannabinol: NEGATIVE — AB

## 2021-02-24 MED ORDER — METHYLPREDNISOLONE SODIUM SUCC 40 MG IJ SOLR
40.0000 mg | Freq: Two times a day (BID) | INTRAMUSCULAR | Status: DC
  Administered 2021-02-24 – 2021-02-26 (×4): 40 mg via INTRAVENOUS
  Filled 2021-02-24 (×5): qty 1

## 2021-02-24 NOTE — Progress Notes (Signed)
Pt arrived to the floor via stretcher from ED. Pt amb from stretcher to hospital bed. Pt SOB with exertion. VSS. Respirations even and mildly labored on room air. Pt placed on 2 L/min, dyspnea improved. Pt alert and oriented x4. Pt oriented to room. Call bell within reach. Bed in low position.

## 2021-02-24 NOTE — ED Notes (Signed)
Pt's O2 decreased to mid 80's while sleeping, placed pt on 2L [place] O2 increased to 96%.

## 2021-02-24 NOTE — ED Notes (Signed)
Breakfast Orders Placed °

## 2021-02-24 NOTE — Progress Notes (Signed)
PROGRESS NOTE    Rachel LandrySandra Royce  ZOX:[id number] DOB: Jun 17, 1970 DOA: 02/23/2021 PCP: Pcp, No   Brief Narrative:  HPI: Rachel Nolan is a 51 y.o. female with medical history significant of major depressive disorder/PTSD, mild intermittent asthma, cocaine abuse, benzodiazepine abuse disorder, alcohol abuse and history of multiple suicide attempts who [facility] department complaints of shortness of breath. She states it started yesterday. She states she is short of breath with exertion and even with sitting and eating. Her albuterol inhaler was not working. She states all she has at home is her inhaler. She has not been hospitalized for her asthma in many, many years. Never intubated. Denies sick contacts. She thinks she his taking the steroids given at discharge a few days ago, but can't confirm this.    She was here on 1/25 for asthma exacerbation, but left AMA for a family emergency. When she left AMA on 1/26 she had been weaned off bipap and was still on 2L [place].    No fever/chills, no vision changes, chest pain or palpitations, +cough that is dry, no abdominal pain, N/V/D, no dysuria and no leg swelling.    She does smoke, +alcohol (40 oz. Day), crack cocaine + on Saturday.  No SI/HI/AH/VH    ER Course:  vitals: afebrile, bp: 153/108, HR: 93, RR: 20 oxygen: 91% RA Pertinent labs: none covid/flu negative  CXR: no opacity/pneumonia. Right atelectasis right base In Ed: continuous neb, duoneb, magnesium and solumedrol   Assessment & Plan:   Principal Problem:   Asthma exacerbation Active Problems:   MDD (major depressive disorder)   Post traumatic stress disorder (PTSD)   Essential hypertension   Cocaine abuse (HCC)   Alcohol abuse   Tobacco abuse  Asthma exacerbation: Patient is not forthcoming.  Feels " so-so" on exam still very wheezy bilaterally.  But not requiring any oxygen.  Will reduce Solu-Medrol to 40 mg twice daily and continue inhalers.    Essential hypertension: Non compliant with medication.  Started back her novasc.  Blood pressure controlled.   Alcohol abuse- (present on admission) Everyday drinker with high risk of withdrawal.  No signs of withdrawal but continue CIWA protocol with vitamins.  Cocaine abuse (HCC)- (present on admission) On going abuse, last used Saturday Encouraged cessation.  Avoid beta-blockers.   MDD (major depressive disorder)- (present on admission) Has been out of her medication for over a month Started back her prozac, seroquel   Post traumatic stress disorder (PTSD)- (present on admission) Not taking her prazosin. Starting back at 1mg  SW consult.   Tobacco abuse- (present on admission) Nicotine patch, encouraged cessation   DVT prophylaxis: enoxaparin (LOVENOX) injection 40 mg Start: 02/23/21 2000   Code Status: Full Code  Family Communication:  None present at bedside.  Plan of care discussed with patient in length and he/she verbalized understanding and agreed with it.  Status is: Observation The patient will require care spanning > 2 midnights and should be moved to inpatient because: Still wheezy.   Estimated body mass index is 29.05 kg/m as calculated from the following:   Height as of 09/21/20: 5\' 6"  (1.676 m).   Weight as of 09/21/20: 81.6 kg.    Nutritional Assessment: There is no height or weight on file to calculate BMI.. Seen by dietician.  I agree with the assessment and plan as outlined below: Nutrition Status:        . Skin Assessment: I have examined the patient's skin and I agree with  the wound assessment as performed by the wound care RN as outlined below:    Consultants:  None  Procedures:  None  Antimicrobials:  Anti-infectives (From admission, onward)    None         Subjective: Seen and examined the ED.  Patient is curled up and covered in several sheets of blankets.  Not willing to talk much, not forthcoming, not willing to make eye contact.   When asked how she was feeling, her only response was " so-so".  She is not answering any other questions for me.  Objective: Vitals:   02/24/21 0830 02/24/21 0845 02/24/21 0932 02/24/21 0936  BP: 119/86 122/86 122/86 121/69  Pulse: 84 89 86   Resp:  20    Temp:      TempSrc:      SpO2: 95% 97%      Intake/Output Summary (Last 24 hours) at 02/24/2021 1031 Last data filed at 02/23/2021 1300 Gross per 24 hour  Intake 50 ml  Output --  Net 50 ml   There were no vitals filed for this visit.  Examination:  General exam: Appears comfortable. Respiratory system: Bilateral expiratory diffuse wheezes. Respiratory effort normal. Cardiovascular system: S1 & S2 heard, RRR. No JVD, murmurs, rubs, gallops or clicks. No pedal edema. Gastrointestinal system: Abdomen is nondistended, soft and nontender. No organomegaly or masses felt. Normal bowel sounds heard. Central nervous system: Alert and oriented. Extremities: Symmetric 5 x 5 power. Skin: No rashes, lesions or ulcers Psychiatry: Judgement and insight appear poor.   Data Reviewed: I have personally reviewed following labs and imaging studies  CBC: Recent Labs  Lab 02/23/21 1200 02/24/21 0333  WBC 5.8 6.6  NEUTROABS 2.4  --   HGB 14.4 12.7  HCT 43.2 36.6  MCV 95.4 93.8  PLT 207 206   Basic Metabolic Panel: Recent Labs  Lab 02/23/21 1200 02/24/21 0333  NA 138 140  K 3.5 4.0  CL 103 104  CO2 26 27  GLUCOSE 145* 118*  BUN 10 11  CREATININE 0.78 0.65  CALCIUM 9.1 9.3   GFR: CrCl cannot be calculated (Unknown ideal weight.). Liver Function Tests: Recent Labs  Lab 02/23/21 1200  AST 18  ALT 17  ALKPHOS 67  BILITOT 0.5  PROT 7.2  ALBUMIN 3.8   No results for input(s): LIPASE, AMYLASE in the last 168 hours. No results for input(s): AMMONIA in the last 168 hours. Coagulation Profile: No results for input(s): INR, PROTIME in the last 168 hours. Cardiac Enzymes: No results for input(s): CKTOTAL, CKMB, CKMBINDEX,  TROPONINI in the last 168 hours. BNP (last 3 results) No results for input(s): PROBNP in the last 8760 hours. HbA1C: No results for input(s): HGBA1C in the last 72 hours. CBG: No results for input(s): GLUCAP in the last 168 hours. Lipid Profile: No results for input(s): CHOL, HDL, LDLCALC, TRIG, CHOLHDL, LDLDIRECT in the last 72 hours. Thyroid Function Tests: No results for input(s): TSH, T4TOTAL, FREET4, T3FREE, THYROIDAB in the last 72 hours. Anemia Panel: No results for input(s): VITAMINB12, FOLATE, FERRITIN, TIBC, IRON, RETICCTPCT in the last 72 hours. Sepsis Labs: No results for input(s): PROCALCITON, LATICACIDVEN in the last 168 hours.  Recent Results (from the past 240 hour(s))  Resp Panel by RT-PCR (Flu A&B, Covid) Nasopharyngeal Swab     Status: None   Collection Time: 02/23/21 11:55 AM   Specimen: Nasopharyngeal Swab; Nasopharyngeal(NP) swabs in vial transport medium  Result Value Ref Range Status   SARS Coronavirus 2 by  RT PCR NEGATIVE NEGATIVE Final    Comment: (NOTE) SARS-CoV-2 target nucleic acids are NOT DETECTED.  The SARS-CoV-2 RNA is generally detectable in upper respiratory specimens during the acute phase of infection. The lowest concentration of SARS-CoV-2 viral copies this assay can detect is 138 copies/mL. A negative result does not preclude SARS-Cov-2 infection and should not be used as the sole basis for treatment or other patient management decisions. A negative result may occur with  improper specimen collection/handling, submission of specimen other than nasopharyngeal swab, presence of viral mutation(s) within the areas targeted by this assay, and inadequate number of viral copies(<138 copies/mL). A negative result must be combined with clinical observations, patient history, and epidemiological information. The expected result is Negative.  Fact Sheet for Patients:  BloggerCourse.com  Fact Sheet for Healthcare Providers:   SeriousBroker.it  This test is no t yet approved or cleared by the [place] FDA and  has been authorized for detection and/or diagnosis of SARS-CoV-2 by FDA under an Emergency Use Authorization (EUA). This EUA will remain  in effect (meaning this test can be used) for the duration of the COVID-19 declaration under Section 564(b)(1) of the Act, 21 U.S.C.section 360bbb-3(b)(1), unless the authorization is terminated  or revoked sooner.       Influenza A by PCR NEGATIVE NEGATIVE Final   Influenza B by PCR NEGATIVE NEGATIVE Final    Comment: (NOTE) The Xpert Xpress SARS-CoV-2/FLU/RSV plus assay is intended as an aid in the diagnosis of influenza from Nasopharyngeal swab specimens and should not be used as a sole basis for treatment. Nasal washings and aspirates are unacceptable for Xpert Xpress SARS-CoV-2/FLU/RSV testing.  Fact Sheet for Patients: BloggerCourse.com  Fact Sheet for Healthcare Providers: SeriousBroker.it  This test is not yet approved or cleared by the [place] FDA and has been authorized for detection and/or diagnosis of SARS-CoV-2 by FDA under an Emergency Use Authorization (EUA). This EUA will remain in effect (meaning this test can be used) for the duration of the COVID-19 declaration under Section 564(b)(1) of the Act, 21 U.S.C. section 360bbb-3(b)(1), unless the authorization is terminated or revoked.  Performed at [facility], [address] [place], [place] [address]      Radiology Studies: DG Chest Portable 1 View  Result Date: 02/23/2021 CLINICAL DATA:  Dyspnea, cough, history asthma, smoker EXAM: PORTABLE CHEST 1 VIEW COMPARISON:  Portable exam 1208 hours compared to 02/11/2021 FINDINGS: Normal heart size, mediastinal contours, and pulmonary vascularity. Subsegmental atelectasis RIGHT lung base. Chronic accentuation of RIGHT infrahilar markings. No definite  acute infiltrate, pleural effusion or pneumothorax. Thoracolumbar scoliosis with single Harrington rod noted. IMPRESSION: Subsegmental atelectasis RIGHT lung base. Electronically Signed   By: Ulyses Southward M.D.   On: 02/23/2021 12:17    Scheduled Meds:  amLODipine  10 mg Oral Daily   enoxaparin (LOVENOX) injection  40 mg Subcutaneous Q24H   FLUoxetine  40 mg Oral q morning   folic acid  1 mg Oral Daily   ipratropium-albuterol  3 mL Nebulization Q6H   methylPREDNISolone (SOLU-MEDROL) injection  120 mg Intravenous Q24H   montelukast  10 mg Oral QHS   multivitamin with minerals  1 tablet Oral Daily   nicotine  14 mg Transdermal Daily   prazosin  1 mg Oral QHS   QUEtiapine  200 mg Oral QHS   sodium chloride flush  3 mL Intravenous Q12H   thiamine  100 mg Oral Daily   Or   thiamine  100 mg Intravenous  Daily   Continuous Infusions:  sodium chloride       LOS: 0 days   Time spent: 31 minutes  Hughie Closs, MD [facility]  02/24/2021, 10:31 AM  Please page via Loretha Stapler and do not message via secure chat for urgent patient care matters. Secure chat can be used for non urgent patient care matters.  How to contact the [facility] Attending or Consulting provider 7A - 7P or covering provider during after hours 7P -7A, for this patient?  Check the care team in [facility] and look for a) attending/consulting TR[facility] provider listed and b) the [facility] team listed. Page or secure chat 7A-7P. Log into [email or web address] and use 's universal password to access. If you do not have the password, please contact the hospital operator. Locate the [facility] provider you are looking for under [facility] and page to a number that you can be directly reached. If you still have difficulty reaching the provider, please page the [facility] (Director on Call) for the Hospitalists listed on amion for assistance.

## 2021-02-25 MED ORDER — KETOROLAC TROMETHAMINE 15 MG/ML IJ SOLN
15.0000 mg | Freq: Four times a day (QID) | INTRAMUSCULAR | Status: DC | PRN
  Administered 2021-02-26: 15 mg via INTRAVENOUS
  Filled 2021-02-25: qty 1

## 2021-02-25 MED ORDER — GUAIFENESIN 100 MG/5ML PO LIQD
5.0000 mL | ORAL | Status: DC | PRN
  Administered 2021-02-26: 5 mL via ORAL
  Filled 2021-02-25: qty 5

## 2021-02-25 MED ORDER — MUSCLE RUB 10-15 % EX CREA
TOPICAL_CREAM | CUTANEOUS | Status: DC | PRN
  Filled 2021-02-25: qty 85

## 2021-02-25 NOTE — Progress Notes (Signed)
°  Transition of Care [facility]) Screening Note   Patient Details  Name: Rachel Nolan Date of Birth: 1970-03-29   Transition of Care [facility]) CM/SW Contact:    Harriet Masson, RN Phone Number: 02/25/2021, 8:34 AM    Transition of Care Department [facility]) has reviewed patient. I have sent request to financial counselor.  We will give patient substance abuse resources and make an apt for PCP.  We will continue to monitor patient advancement through interdisciplinary progression rounds.

## 2021-02-25 NOTE — TOC Initial Note (Signed)
Transition of Care [facility]) - Initial/Assessment Note    Patient Details  Name: Rachel Nolan MRN: [id number] Date of Birth: [birth year]  Transition of Care [facility]) CM/SW Contact:    Harriet Masson, RN Phone Number: 02/25/2021, 2:24 PM  Clinical Narrative:             Spoke to patient regarding transition needs.  She is agreeable for financial counseling to come see her regarding her having no insurance. She is also agreeable for me to make her an apt for PCP with [facility]. Apt has been made. MD ordered her a nebulizer machine.       Spoke to [employer] with [employer] and nebulizer machine will be delivered to patient's room. Patient states her friend can take her to apts and should be able to take her home once discharged.  MATCH done for medication assistance.  Please send medications to [facility] pharmacy  TOC will continue to follow for needs   Expected Discharge Plan: Home/Self Care Barriers to Discharge: Continued Medical Work up   Patient Goals and CMS Choice Patient states their goals for this hospitalization and ongoing recovery are:: return home      Expected Discharge Plan and Services Expected Discharge Plan: Home/Self Care   Discharge Planning Services: CM Consult, Follow-up appt scheduled, Medication Assistance, MATCH Program                     DME Arranged: Nebulizer machine DME Agency: [employer] Date DME Agency Contacted: 02/25/21 Time DME Agency Contacted: 1423 Representative spoke with at DME Agency: Velna Hatchet            Prior Living Arrangements/Services   Lives with:: Roommate Patient language and need for interpreter reviewed:: Yes Do you feel safe going back to the place where you live?: Yes        Care giver support system in place?: No (comment)   Criminal Activity/Legal Involvement Pertinent to Current Situation/Hospitalization: No - Comment as needed  Activities of Daily Living Home Assistive Devices/Equipment: None ADL Screening (condition at time  of admission) Patient's cognitive ability adequate to safely complete daily activities?: Yes Is the patient deaf or have difficulty hearing?: No Does the patient have difficulty seeing, even when wearing glasses/contacts?: No Does the patient have difficulty concentrating, remembering, or making decisions?: No Patient able to express need for assistance with ADLs?: Yes Does the patient have difficulty dressing or bathing?: No Independently performs ADLs?: Yes (appropriate for developmental age) Does the patient have difficulty walking or climbing stairs?: Yes Weakness of Legs: None Weakness of Arms/Hands: None  Permission Sought/Granted                  Emotional Assessment Appearance:: Appears stated age Attitude/Demeanor/Rapport: Engaged Affect (typically observed): Accepting Orientation: : Oriented to Self, Oriented to Place, Oriented to  Time, Oriented to Situation Alcohol / Substance Use: Illicit Drugs, Alcohol Use Psych Involvement: No (comment)  Admission diagnosis:  Severe persistent asthma with exacerbation [J45.51] Asthma exacerbation [J45.901] Patient Active Problem List   Diagnosis Date Noted   Acute asthma exacerbation 02/12/2021   Nicotine dependence, cigarettes, uncomplicated 02/12/2021   Asthma exacerbation 02/12/2021   Acute bronchitis 09/24/2020   Mild asthma exacerbation 09/24/2020   Tobacco abuse 09/24/2020   Abnormal weight loss 09/24/2020   Tachycardia 09/23/2020   Asthma 09/23/2020   Alcohol abuse 09/21/2020   Homeless 08/22/2020   Cocaine abuse (HCC) 07/09/2020   Opioid use disorder, moderate, dependence (HCC) 05/16/2020   Adjustment disorder with  depressed mood 04/24/2020   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   Essential hypertension 03/26/2020   Neuropathic pain 03/26/2020   Severe recurrent major depression with psychotic features (HCC) 03/25/2020   Major depressive disorder with psychotic features (HCC) 01/24/2020   Stimulant  use disorder 01/24/2020   Alcohol use disorder, severe, dependence (HCC) 12/06/2019   Alcohol withdrawal (HCC) 11/28/2019   Suicidal ideation 11/28/2019   Cocaine dependence (HCC) 03/05/2019   Major depressive disorder, recurrent episode, severe (HCC) 03/02/2019   Post traumatic stress disorder (PTSD) 03/02/2019   Cocaine use disorder (HCC) 03/02/2019   Depression 03/02/2019   Substance induced mood disorder (HCC) 03/02/2019   MDD (major depressive disorder) 03/01/2019   PCP:  Pcp, No Pharmacy:   [facility] [address], [address] [place] [address] Phone: [phone] Fax: [phone]  [facility] [address] [place] [address] Phone: [phone] Fax: [phone]     Social Determinants of Health (SDOH) Interventions    Readmission Risk Interventions Readmission Risk Prevention Plan 12/04/2019  Transportation Screening Complete  PCP or Specialist Appt within 5-7 Days Complete  Home Care Screening Complete  Medication Review (RN CM) Complete  Some recent data might be hidden

## 2021-02-25 NOTE — Progress Notes (Addendum)
PROGRESS NOTE    Rachel Nolan  BMW:[id number] DOB: Oct 22, 1970 DOA: 02/23/2021 PCP: Pcp, No   Brief Narrative:  HPI: Rachel Nolan is a 51 y.o. female with medical history significant of major depressive disorder/PTSD, mild intermittent asthma, cocaine abuse, benzodiazepine abuse disorder, alcohol abuse and history of multiple suicide attempts who presents to [facility] emergency department complaints of shortness of breath. She states it started yesterday. She states she is short of breath with exertion and even with sitting and eating. Her albuterol inhaler was not working. She states all she has at home is her inhaler. She has not been hospitalized for her asthma in many, many years. Never intubated. Denies sick contacts. She thinks she his taking the steroids given at discharge a few days ago, but can't confirm this.    She was here on 1/25 for asthma exacerbation, but left AMA for a family emergency. When she left AMA on 1/26 she had been weaned off bipap and was still on 2L [place].    No fever/chills, no vision changes, chest pain or palpitations, +cough that is dry, no abdominal pain, N/V/D, no dysuria and no leg swelling.    She does smoke, +alcohol (40 oz. Day), crack cocaine + on Saturday.  No SI/HI/AH/VH    ER Course:  vitals: afebrile, bp: 153/108, HR: 93, RR: 20 oxygen: 91% RA Pertinent labs: none covid/flu negative  CXR: no opacity/pneumonia. Right atelectasis right base In Ed: continuous neb, duoneb, magnesium and solumedrol   Assessment & Plan:   Principal Problem:   Asthma exacerbation Active Problems:   MDD (major depressive disorder)   Post traumatic stress disorder (PTSD)   Essential hypertension   Cocaine abuse (HCC)   Alcohol abuse   Tobacco abuse  Asthma exacerbation:  -Patient reports she still feeling better, but she still reports significant dyspnea with activity, discussed with staff to ambulate her today on the hallway to see what her oxygen  saturation, still having significant wheezing bilaterally, creased work of breathing, so I will hold on decreasing her IV steroids any further today, continue with as needed nebs as well, ambulated Cefanex requirement on discharge possibly tomorrow, as well likely she will need nebulizer machine at home.   -She remains tachycardic at rest today  Essential hypertension: Non compliant with medication.  Started back her novasc.  Blood pressure controlled.   Alcohol abuse- (present on admission) Everyday drinker with high risk of withdrawal.  No signs of withdrawal but continue CIWA protocol with vitamins.  Cocaine abuse (HCC)- (present on admission) On going abuse, last used Saturday Encouraged cessation.  Avoid beta-blockers.   MDD (major depressive disorder)- (present on admission) Has been out of her medication for over a month Started back her prozac, seroquel   Post traumatic stress disorder (PTSD)- (present on admission) Not taking her prazosin. Starting back at 1mg  SW consult.   Tobacco abuse- (present on admission) Nicotine patch, encouraged cessation   DVT prophylaxis: enoxaparin (LOVENOX) injection 40 mg Start: 02/23/21 2000   Code Status: Full Code  Family Communication:  None present at bedside.  Plan of care discussed with patient in length and he/she verbalized understanding and agreed with it.  Status 04/23/21 The patient will require care spanning > 2 midnights and should be moved to inpatient because: Still wheezy.   Estimated body mass index is 29.53 kg/m as calculated from the following:   Height as of this encounter: 5\' 6"  (1.676 m).   Weight as of this encounter: 83 kg.  Nutritional Assessment: Body mass index is 29.53 kg/m.Marland Kitchen Seen by dietician.  I agree with the assessment and plan as outlined below: Nutrition Status:        . Skin Assessment: I have examined the patient's skin and I agree with the wound assessment as performed by the wound care  RN as outlined below:    Consultants:  None  Procedures:  None  Antimicrobials:  Anti-infectives (From admission, onward)    None         Subjective:  Patient reports she is feeling better today, still reports cough, and she still have some audible wheezing, still reports dyspnea, mainly with minimal activity.   Objective: Vitals:   02/25/21 0313 02/25/21 0721 02/25/21 0807 02/25/21 1205  BP: 110/78  (!) 146/69 (!) 148/107  Pulse: 89  98 89  Resp: (!) 24  20 20   Temp: 98 F (36.7 C)  97.8 F (36.6 C) 97.9 F (36.6 C)  TempSrc: Oral  Oral Axillary  SpO2: 93% 95% 94% 93%  Weight:      Height:        Intake/Output Summary (Last 24 hours) at 02/25/2021 1239 Last data filed at 02/25/2021 04/25/2021 Gross per 24 hour  Intake 180 ml  Output --  Net 180 ml   Filed Weights   02/24/21 1444  Weight: 83 kg    Examination:  Awake Alert, Oriented X 3, No new F.N deficits, Normal affect Symmetrical Chest wall movement, good air entry bilaterally, but she remains with diffuse wheezing Tachycardic,No Gallops,Rubs or new Murmurs, No Parasternal Heave +ve B.Sounds, Abd Soft, No tenderness, No rebound - guarding or rigidity. No Cyanosis, Clubbing or edema, No new Rash or bruise     Data Reviewed: I have personally reviewed following labs and imaging studies  CBC: Recent Labs  Lab 02/23/21 1200 02/24/21 0333  WBC 5.8 6.6  NEUTROABS 2.4  --   HGB 14.4 12.7  HCT 43.2 36.6  MCV 95.4 93.8  PLT 207 206   Basic Metabolic Panel: Recent Labs  Lab 02/23/21 1200 02/24/21 0333  NA 138 140  K 3.5 4.0  CL 103 104  CO2 26 27  GLUCOSE 145* 118*  BUN 10 11  CREATININE 0.78 0.65  CALCIUM 9.1 9.3   GFR: Estimated Creatinine Clearance: 89.3 mL/min (by C-G formula based on SCr of 0.65 mg/dL). Liver Function Tests: Recent Labs  Lab 02/23/21 1200  AST 18  ALT 17  ALKPHOS 67  BILITOT 0.5  PROT 7.2  ALBUMIN 3.8   No results for input(s): LIPASE, AMYLASE in the last 168  hours. No results for input(s): AMMONIA in the last 168 hours. Coagulation Profile: No results for input(s): INR, PROTIME in the last 168 hours. Cardiac Enzymes: No results for input(s): CKTOTAL, CKMB, CKMBINDEX, TROPONINI in the last 168 hours. BNP (last 3 results) No results for input(s): PROBNP in the last 8760 hours. HbA1C: No results for input(s): HGBA1C in the last 72 hours. CBG: No results for input(s): GLUCAP in the last 168 hours. Lipid Profile: No results for input(s): CHOL, HDL, LDLCALC, TRIG, CHOLHDL, LDLDIRECT in the last 72 hours. Thyroid Function Tests: No results for input(s): TSH, T4TOTAL, FREET4, T3FREE, THYROIDAB in the last 72 hours. Anemia Panel: No results for input(s): VITAMINB12, FOLATE, FERRITIN, TIBC, IRON, RETICCTPCT in the last 72 hours. Sepsis Labs: No results for input(s): PROCALCITON, LATICACIDVEN in the last 168 hours.  Recent Results (from the past 240 hour(s))  Resp Panel by RT-PCR (Flu A&B, Covid) Nasopharyngeal  Swab     Status: None   Collection Time: 02/23/21 11:55 AM   Specimen: Nasopharyngeal Swab; Nasopharyngeal(NP) swabs in vial transport medium  Result Value Ref Range Status   SARS Coronavirus 2 by RT PCR NEGATIVE NEGATIVE Final    Comment: (NOTE) SARS-CoV-2 target nucleic acids are NOT DETECTED.  The SARS-CoV-2 RNA is generally detectable in upper respiratory specimens during the acute phase of infection. The lowest concentration of SARS-CoV-2 viral copies this assay can detect is 138 copies/mL. A negative result does not preclude SARS-Cov-2 infection and should not be used as the sole basis for treatment or other patient management decisions. A negative result may occur with  improper specimen collection/handling, submission of specimen other than nasopharyngeal swab, presence of viral mutation(s) within the areas targeted by this assay, and inadequate number of viral copies(<138 copies/mL). A negative result must be combined  with clinical observations, patient history, and epidemiological information. The expected result is Negative.  Fact Sheet for Patients:  BloggerCourse.com  Fact Sheet for Healthcare Providers:  SeriousBroker.it  This test is no t yet approved or cleared by the [place] FDA and  has been authorized for detection and/or diagnosis of SARS-CoV-2 by FDA under an Emergency Use Authorization (EUA). This EUA will remain  in effect (meaning this test can be used) for the duration of the COVID-19 declaration under Section 564(b)(1) of the Act, 21 U.S.C.section 360bbb-3(b)(1), unless the authorization is terminated  or revoked sooner.       Influenza A by PCR NEGATIVE NEGATIVE Final   Influenza B by PCR NEGATIVE NEGATIVE Final    Comment: (NOTE) The Xpert Xpress SARS-CoV-2/FLU/RSV plus assay is intended as an aid in the diagnosis of influenza from Nasopharyngeal swab specimens and should not be used as a sole basis for treatment. Nasal washings and aspirates are unacceptable for Xpert Xpress SARS-CoV-2/FLU/RSV testing.  Fact Sheet for Patients: BloggerCourse.com  Fact Sheet for Healthcare Providers: SeriousBroker.it  This test is not yet approved or cleared by the [place] FDA and has been authorized for detection and/or diagnosis of SARS-CoV-2 by FDA under an Emergency Use Authorization (EUA). This EUA will remain in effect (meaning this test can be used) for the duration of the COVID-19 declaration under Section 564(b)(1) of the Act, 21 U.S.C. section 360bbb-3(b)(1), unless the authorization is terminated or revoked.  Performed at [facility], [address], [place], [place] [address]      Radiology Studies: No results found.  Scheduled Meds:  amLODipine  10 mg Oral Daily   enoxaparin (LOVENOX) injection  40 mg Subcutaneous Q24H   FLUoxetine  40 mg Oral q  morning   folic acid  1 mg Oral Daily   ipratropium-albuterol  3 mL Nebulization Q6H   methylPREDNISolone (SOLU-MEDROL) injection  40 mg Intravenous Q12H   montelukast  10 mg Oral QHS   multivitamin with minerals  1 tablet Oral Daily   nicotine  14 mg Transdermal Daily   prazosin  1 mg Oral QHS   QUEtiapine  200 mg Oral QHS   sodium chloride flush  3 mL Intravenous Q12H   thiamine  100 mg Oral Daily   Or   thiamine  100 mg Intravenous Daily   Continuous Infusions:  sodium chloride       LOS: 1 day     Huey Bienenstock, MD [facility]  02/25/2021, 12:39 PM

## 2021-02-25 NOTE — Progress Notes (Signed)
[facility] notified that patient chronic back pain not relieved with 650 mg of Tylenol po. Ilean Skill LPN

## 2021-02-25 NOTE — Progress Notes (Signed)
SATURATION QUALIFICATIONS: (This note is used to comply with regulatory documentation for home oxygen)  Patient Saturations on Room Air at Rest = 94%  Patient Saturations on Room Air while Ambulating = 84%  Patient Saturations on 2 Liters of oxygen while Ambulating = 91%  Please briefly explain why patient needs home oxygen: SOB on exertion/ambulation

## 2021-02-26 ENCOUNTER — Other Ambulatory Visit (HOSPITAL_COMMUNITY): Payer: Self-pay

## 2021-02-26 MED ORDER — ALBUTEROL SULFATE HFA 108 (90 BASE) MCG/ACT IN AERS
2.0000 | INHALATION_SPRAY | Freq: Four times a day (QID) | RESPIRATORY_TRACT | 0 refills | Status: DC | PRN
  Filled 2021-02-26: qty 8.5, 25d supply, fill #0

## 2021-02-26 MED ORDER — DOXYCYCLINE HYCLATE 100 MG PO TABS
100.0000 mg | ORAL_TABLET | Freq: Two times a day (BID) | ORAL | 0 refills | Status: DC
  Filled 2021-02-26: qty 10, 5d supply, fill #0

## 2021-02-26 MED ORDER — AMLODIPINE BESYLATE 10 MG PO TABS
10.0000 mg | ORAL_TABLET | Freq: Every day | ORAL | 0 refills | Status: DC
  Filled 2021-02-26: qty 30, 30d supply, fill #0

## 2021-02-26 MED ORDER — PRAZOSIN HCL 1 MG PO CAPS
3.0000 mg | ORAL_CAPSULE | Freq: Every evening | ORAL | 0 refills | Status: DC
  Filled 2021-02-26: qty 90, 30d supply, fill #0

## 2021-02-26 MED ORDER — ALBUTEROL SULFATE (2.5 MG/3ML) 0.083% IN NEBU
2.5000 mg | INHALATION_SOLUTION | RESPIRATORY_TRACT | 0 refills | Status: DC | PRN
  Filled 2021-02-26: qty 75, 5d supply, fill #0

## 2021-02-26 MED ORDER — FOLIC ACID 1 MG PO TABS: 1.0000 mg | ORAL_TABLET | Freq: Every day | ORAL | Status: DC

## 2021-02-26 MED ORDER — PREDNISONE 10 MG (21) PO TBPK
ORAL_TABLET | ORAL | 0 refills | Status: DC
  Filled 2021-02-26: qty 21, 6d supply, fill #0

## 2021-02-26 MED ORDER — FLUOXETINE HCL 40 MG PO CAPS
40.0000 mg | ORAL_CAPSULE | Freq: Every morning | ORAL | 0 refills | Status: DC
  Filled 2021-02-26: qty 30, 30d supply, fill #0

## 2021-02-26 MED ORDER — DOXYCYCLINE HYCLATE 100 MG PO TABS
100.0000 mg | ORAL_TABLET | Freq: Two times a day (BID) | ORAL | Status: DC
  Administered 2021-02-26: 100 mg via ORAL
  Filled 2021-02-26: qty 1

## 2021-02-26 MED ORDER — METHYLPREDNISOLONE SODIUM SUCC 125 MG IJ SOLR
80.0000 mg | Freq: Three times a day (TID) | INTRAMUSCULAR | Status: DC
  Administered 2021-02-26: 80 mg via INTRAVENOUS
  Filled 2021-02-26: qty 2

## 2021-02-26 MED ORDER — MONTELUKAST SODIUM 10 MG PO TABS
10.0000 mg | ORAL_TABLET | Freq: Every day | ORAL | 0 refills | Status: DC
  Filled 2021-02-26: qty 30, 30d supply, fill #0

## 2021-02-26 NOTE — TOC Transition Note (Signed)
Transition of Care [facility]) - CM/SW Discharge Note   Patient Details  Name: Rachel Nolan MRN: [id number] Date of Birth: 09/17/1970  Transition of Care [facility]) CM/SW Contact:  Lockie Pares, RN Phone Number: 02/26/2021, 11:45 AM   Clinical Narrative:     Patient DC today, [employer] called for oxygen, qualifiers in chart. MATCH done for medications, TOC pharmacy notified. Patient has nebulizer in room and states she can get a ride. PCP appointment made.   Final next level of care: Home/Self Care Barriers to Discharge: No Barriers Identified   Patient Goals and CMS Choice Patient states their goals for this hospitalization and ongoing recovery are:: return home      Discharge Placement               Home self care with DME        Discharge Plan and Services   Discharge Planning Services: CM Consult, Follow-up appt scheduled, Medication Assistance, MATCH Program            DME Arranged: Oxygen DME Agency: [employer] Date DME Agency Contacted: 02/26/21 Time DME Agency Contacted: 1145 Representative spoke with at DME Agency: Velna Hatchet            Social Determinants of Health (SDOH) Interventions     Readmission Risk Interventions Readmission Risk Prevention Plan 12/04/2019  Transportation Screening Complete  PCP or Specialist Appt within 5-7 Days Complete  Home Care Screening Complete  Medication Review (RN CM) Complete  Some recent data might be hidden

## 2021-02-26 NOTE — Plan of Care (Signed)

## 2021-02-26 NOTE — Discharge Summary (Signed)
Physician Discharge Summary  Rachel LandrySandra Nees ZOX:[id number] DOB: 01/09/1971 DOA: 02/23/2021  PCP: Pcp, No  Admit date: 02/23/2021 Discharge date: 02/26/2021  Admitted From: Home Disposition:  Home   Recommendations for Outpatient Follow-up:  Follow up with PCP in 1-2 weeks Please obtain BMP/CBC in one week  Home Health:NO Equipment/Devices:oxygen 2 L  Discharge Condition:Stable, I have offered patient to stay another 24 hours on IV steroids, but she was adamant about going home today, and she did have a history of leaving AMA in the past, so I did proceed with regular discharge so we can arrange for home medications and oxygens. CODE STATUS:FULL Diet recommendation: Heart Healthy   Brief/Interim Summary:  Rachel Nolan is a 51 y.o. female with medical history significant of major depressive disorder/PTSD, mild intermittent asthma, cocaine abuse, benzodiazepine abuse disorder, alcohol abuse and history of multiple suicide attempts who presents to [facility] department complaints of shortness of breath.  She was admitted for asthma exacerbation.     She was here on 1/25 for asthma exacerbation, but left AMA for a family emergency. When she left AMA on 1/26 she had been weaned off bipap and was still on 2L [place].    Asthma exacerbation/acute hypoxic respiratory failure -With recent hospitalization for asthma exacerbation, where she left AMA, she presents with significant dyspnea, wheezing, she improved on IV steroids, nebulizer treatments, she is feeling better today, and wants to go home, I offered to stay another 24 hours but she is adamant about leaving today, so she will be discharged on prednisone taper, 5 days of doxycycline, DME oxygen and nebs has been arranged.     Essential hypertension:  Non compliant with medication.  Started back her novasc and [place] seen, will hold on resuming metoprolol due to cocaine use and asthma.   Alcohol abuse- (present on  admission) Kept on CIWA protocol with no evidence of withdrawals during hospital stay   Cocaine abuse (HCC)- (present on admission) On going abuse, last used Saturday Encouraged cessation.  Avoid beta-blockers.   MDD (major depressive disorder)- (present on admission) Has been out of her medication for over a month Started back her prozac   Tobacco abuse- (present on admission) Nicotine patch, encouraged cessation     Discharge Diagnoses:  Principal Problem:   Asthma exacerbation Active Problems:   MDD (major depressive disorder)   Post traumatic stress disorder (PTSD)   Essential hypertension   Cocaine abuse (HCC)   Alcohol abuse   Tobacco abuse    Discharge Instructions  Discharge Instructions     Diet - low sodium heart healthy   Complete by: As directed    Discharge instructions   Complete by: As directed    Follow with Primary MD  in 7 days   Get CBC, CMP, 2 view Chest X ray checked  by Primary MD next visit.    Activity: As tolerated with Full fall precautions use walker/cane & assistance as needed   Disposition Home    Diet: Heart Healthy    On your next visit with your primary care physician please Get Medicines reviewed and adjusted.   Please request your Prim.MD to go over all Hospital Tests and Procedure/Radiological results at the follow up, please get all Hospital records sent to your Prim MD by signing hospital release before you go home.   If you experience worsening of your admission symptoms, develop shortness of breath, life threatening emergency, suicidal or homicidal thoughts you must seek medical attention immediately by calling  911 or calling your MD immediately  if symptoms less severe.  You Must read complete instructions/literature along with all the possible adverse reactions/side effects for all the Medicines you take and that have been prescribed to you. Take any new Medicines after you have completely understood and accpet all the  possible adverse reactions/side effects.   Do not drive, operating heavy machinery, perform activities at heights, swimming or participation in water activities or provide baby sitting services if your were admitted for syncope or siezures until you have seen by Primary MD or a Neurologist and advised to do so again.  Do not drive when taking Pain medications.    Do not take more than prescribed Pain, Sleep and Anxiety Medications  Special Instructions: If you have smoked or chewed Tobacco  in the last 2 yrs please stop smoking, stop any regular Alcohol  and or any Recreational drug use.  Wear Seat belts while driving.   Please note  You were cared for by a hospitalist during your hospital stay. If you have any questions about your discharge medications or the care you received while you were in the hospital after you are discharged, you can call the unit and asked to speak with the hospitalist on call if the hospitalist that took care of you is not available. Once you are discharged, your primary care physician will handle any further medical issues. Please note that NO REFILLS for any discharge medications will be authorized once you are discharged, as it is imperative that you return to your primary care physician (or establish a relationship with a primary care physician if you do not have one) for your aftercare needs so that they can reassess your need for medications and monitor your lab values.   Increase activity slowly   Complete by: As directed       Allergies as of 02/26/2021   No Known Allergies      Medication List     STOP taking these medications    gabapentin 300 MG capsule Commonly known as: NEURONTIN   metoprolol succinate 25 MG 24 hr tablet Commonly known as: TOPROL-XL   predniSONE 10 MG tablet Commonly known as: DELTASONE Replaced by: predniSONE 10 MG (21) Tbpk tablet   traZODone 150 MG tablet Commonly known as: DESYREL       TAKE these medications     albuterol 108 (90 Base) MCG/ACT inhaler Commonly known as: VENTOLIN HFA Inhale 2 puffs into the lungs every 6 (six) hours as needed. What changed: reasons to take this   albuterol (2.5 MG/3ML) 0.083% nebulizer solution Commonly known as: PROVENTIL Take 3 mLs (2.5 mg total) by nebulization every 4 (four) hours as needed for wheezing or shortness of breath. What changed: You were already taking a medication with the same name, and this prescription was added. Make sure you understand how and when to take each.   amLODipine 10 MG tablet Commonly known as: NORVASC Take 1 tablet (10 mg total) by mouth daily.   doxycycline 100 MG tablet Commonly known as: VIBRA-TABS Take 1 tablet (100 mg total) by mouth every 12 (twelve) hours.   FLUoxetine 40 MG capsule Commonly known as: PROZAC Take 1 capsule (40 mg total) by mouth every morning.   folic acid 1 MG tablet Commonly known as: FOLVITE Take 1 tablet (1 mg total) by mouth daily. Start taking on: February 27, 2021   ibuprofen 200 MG tablet Commonly known as: ADVIL Take 400 mg by mouth every 6 (six)  hours as needed for headache (pain).   montelukast 10 MG tablet Commonly known as: SINGULAIR Take 1 tablet (10 mg total) by mouth at bedtime.   nicotine 14 mg/24hr patch Commonly known as: NICODERM CQ - dosed in mg/24 hours Place 1 patch (14 mg total) onto the skin daily.   prazosin 1 MG capsule Commonly known as: MINIPRESS Take 3 capsules (3 mg total) by mouth at bedtime.   predniSONE 10 MG (21) Tbpk tablet Commonly known as: STERAPRED UNI-PAK 21 TAB Use per pack instruction Replaces: predniSONE 10 MG tablet   QUEtiapine 200 MG tablet Commonly known as: SEROQUEL Take 1 tablet (200 mg total) by mouth at bedtime. What changed: Another medication with the same name was removed. Continue taking this medication, and follow the directions you see here.               Durable Medical Equipment  (From admission, onward)            Start     Ordered   02/26/21 1138  DME Oxygen  Once       Question Answer Comment  Length of Need 6 Months   Mode or (Route) Nasal cannula   Liters per Minute 2   Frequency Continuous (stationary and portable oxygen unit needed)   Oxygen conserving device Yes   Oxygen delivery system Gas      02/26/21 1137   02/26/21 1136  For home use only DME oxygen  Once       Question Answer Comment  Length of Need 6 Months   Mode or (Route) Nasal cannula   Liters per Minute 2   Frequency Continuous (stationary and portable oxygen unit needed)   Oxygen conserving device Yes   Oxygen delivery system Gas      02/26/21 1136   02/25/21 1243  For home use only DME Nebulizer machine  Once       Question Answer Comment  Patient needs a nebulizer to treat with the following condition Asthma   Length of Need 12 Months      02/25/21 1242            Follow-up Information     Claiborne RiggFleming, Zelda W, NP Follow up on 04/03/2021.   Specialty: Nurse Practitioner Why: Appointment 04/03/21 at 9:30. Please call if you can not make this appointment. Contact information: [address] [place] [address] [phone]                No Known Allergies  Consultations: None   Procedures/Studies: DG Chest Portable 1 View  Result Date: 02/23/2021 CLINICAL DATA:  Dyspnea, cough, history asthma, smoker EXAM: PORTABLE CHEST 1 VIEW COMPARISON:  Portable exam 1208 hours compared to 02/11/2021 FINDINGS: Normal heart size, mediastinal contours, and pulmonary vascularity. Subsegmental atelectasis RIGHT lung base. Chronic accentuation of RIGHT infrahilar markings. No definite acute infiltrate, pleural effusion or pneumothorax. Thoracolumbar scoliosis with single Harrington rod noted. IMPRESSION: Subsegmental atelectasis RIGHT lung base. Electronically Signed   By: Ulyses SouthwardMark  Boles M.D.   On: 02/23/2021 12:17   DG Chest Portable 1 View  Result Date: 02/11/2021 CLINICAL DATA:  Shortness of breath.   Wheezing. EXAM: PORTABLE CHEST 1 VIEW COMPARISON:  09/18/2020 FINDINGS: The cardiomediastinal contours are normal. Mild bronchial thickening. Pulmonary vasculature is normal. No consolidation, pleural effusion, or pneumothorax. No acute osseous abnormalities are seen. Spinal fusion hardware partially visualized. IMPRESSION: Mild bronchial thickening, can be seen with asthma or bronchitis. Electronically Signed   By: Shawna OrleansMelanie  Sanford M.D.   On: 02/11/2021 22:14      Subjective:  Patient reports dyspnea has improved, still reports some cough, productive, improved as well, denies any chest pain or fever. Discharge Exam: Vitals:   02/26/21 0734 02/26/21 0904  BP:    Pulse: 100 99  Resp: 18 20  Temp: 99.2 F (37.3 C)   SpO2: 96% 98%   Vitals:   02/26/21 0150 02/26/21 0357 02/26/21 0734 02/26/21 0904  BP:  (!) 149/103    Pulse:  87 100 99  Resp:  19 18 20   Temp:  97.6 F (36.4 C) 99.2 F (37.3 C)   TempSrc:  Oral Oral   SpO2: 99% 97% 96% 98%  Weight:      Height:        General: Pt is alert, awake, not in acute distress Cardiovascular: RRR, S1/S2 +, no rubs, no gallops Respiratory: Patient still with scattered wheezing bilaterally, but this has significantly improved, in no respiratory distress. Abdominal: Soft, NT, ND, bowel sounds + Extremities: no edema, no cyanosis    The results of significant diagnostics from this hospitalization (including imaging, microbiology, ancillary and laboratory) are listed below for reference.     Microbiology: Recent Results (from the past 240 hour(s))  Resp Panel by RT-PCR (Flu A&B, Covid) Nasopharyngeal Swab     Status: None   Collection Time: 02/23/21 11:55 AM   Specimen: Nasopharyngeal Swab; Nasopharyngeal(NP) swabs in vial transport medium  Result Value Ref Range Status   SARS Coronavirus 2 by RT PCR NEGATIVE NEGATIVE Final    Comment: (NOTE) SARS-CoV-2 target nucleic acids are NOT DETECTED.  The SARS-CoV-2 RNA is generally  detectable in upper respiratory specimens during the acute phase of infection. The lowest concentration of SARS-CoV-2 viral copies this assay can detect is 138 copies/mL. A negative result does not preclude SARS-Cov-2 infection and should not be used as the sole basis for treatment or other patient management decisions. A negative result may occur with  improper specimen collection/handling, submission of specimen other than nasopharyngeal swab, presence of viral mutation(s) within the areas targeted by this assay, and inadequate number of viral copies(<138 copies/mL). A negative result must be combined with clinical observations, patient history, and epidemiological information. The expected result is Negative.  Fact Sheet for Patients:  04/23/21  Fact Sheet for Healthcare Providers:  BloggerCourse.com  This test is no t yet approved or cleared by the [place] FDA and  has been authorized for detection and/or diagnosis of SARS-CoV-2 by FDA under an Emergency Use Authorization (EUA). This EUA will remain  in effect (meaning this test can be used) for the duration of the COVID-19 declaration under Section 564(b)(1) of the Act, 21 U.S.C.section 360bbb-3(b)(1), unless the authorization is terminated  or revoked sooner.       Influenza A by PCR NEGATIVE NEGATIVE Final   Influenza B by PCR NEGATIVE NEGATIVE Final    Comment: (NOTE) The Xpert Xpress SARS-CoV-2/FLU/RSV plus assay is intended as an aid in the diagnosis of influenza from Nasopharyngeal swab specimens and should not be used as a sole basis for treatment. Nasal washings and aspirates are unacceptable for Xpert Xpress SARS-CoV-2/FLU/RSV testing.  Fact Sheet for Patients: [place]  Fact Sheet for Healthcare Providers: BloggerCourse.com  This test is not yet approved or cleared by the [place] FDA  and has been authorized for detection and/or diagnosis of SARS-CoV-2 by FDA under an Emergency Use Authorization (EUA). This EUA will remain in effect (meaning this test can  be used) for the duration of the COVID-19 declaration under Section 564(b)(1) of the Act, 21 U.S.C. section 360bbb-3(b)(1), unless the authorization is terminated or revoked.  Performed at [facility], [address] [place], [place] [address]      Labs: BNP (last 3 results) Recent Labs    02/23/21 1155  BNP 14.0   Basic Metabolic Panel: Recent Labs  Lab 02/23/21 1200 02/24/21 0333  NA 138 140  K 3.5 4.0  CL 103 104  CO2 26 27  GLUCOSE 145* 118*  BUN 10 11  CREATININE 0.78 0.65  CALCIUM 9.1 9.3   Liver Function Tests: Recent Labs  Lab 02/23/21 1200  AST 18  ALT 17  ALKPHOS 67  BILITOT 0.5  PROT 7.2  ALBUMIN 3.8   No results for input(s): LIPASE, AMYLASE in the last 168 hours. No results for input(s): AMMONIA in the last 168 hours. CBC: Recent Labs  Lab 02/23/21 1200 02/24/21 0333  WBC 5.8 6.6  NEUTROABS 2.4  --   HGB 14.4 12.7  HCT 43.2 36.6  MCV 95.4 93.8  PLT 207 206   Cardiac Enzymes: No results for input(s): CKTOTAL, CKMB, CKMBINDEX, TROPONINI in the last 168 hours. BNP: Invalid input(s): POCBNP CBG: No results for input(s): GLUCAP in the last 168 hours. D-Dimer No results for input(s): DDIMER in the last 72 hours. Hgb A1c No results for input(s): HGBA1C in the last 72 hours. Lipid Profile No results for input(s): CHOL, HDL, LDLCALC, TRIG, CHOLHDL, LDLDIRECT in the last 72 hours. Thyroid function studies No results for input(s): TSH, T4TOTAL, T3FREE, THYROIDAB in the last 72 hours.  Invalid input(s): FREET3 Anemia work up No results for input(s): VITAMINB12, FOLATE, FERRITIN, TIBC, IRON, RETICCTPCT in the last 72 hours. Urinalysis    Component Value Date/Time   COLORURINE STRAW (A) 07/14/2020 0600   APPEARANCEUR CLEAR 07/14/2020 0600   LABSPEC 1.015  07/14/2020 0600   PHURINE 7.0 07/14/2020 0600   GLUCOSEU NEGATIVE 07/14/2020 0600   HGBUR NEGATIVE 07/14/2020 0600   BILIRUBINUR NEGATIVE 07/14/2020 0600   KETONESUR NEGATIVE 07/14/2020 0600   PROTEINUR NEGATIVE 07/14/2020 0600   NITRITE NEGATIVE 07/14/2020 0600   LEUKOCYTESUR NEGATIVE 07/14/2020 0600   Sepsis Labs Invalid input(s): PROCALCITONIN,  WBC,  LACTICIDVEN Microbiology Recent Results (from the past 240 hour(s))  Resp Panel by RT-PCR (Flu A&B, Covid) Nasopharyngeal Swab     Status: None   Collection Time: 02/23/21 11:55 AM   Specimen: Nasopharyngeal Swab; Nasopharyngeal(NP) swabs in vial transport medium  Result Value Ref Range Status   SARS Coronavirus 2 by RT PCR NEGATIVE NEGATIVE Final    Comment: (NOTE) SARS-CoV-2 target nucleic acids are NOT DETECTED.  The SARS-CoV-2 RNA is generally detectable in upper respiratory specimens during the acute phase of infection. The lowest concentration of SARS-CoV-2 viral copies this assay can detect is 138 copies/mL. A negative result does not preclude SARS-Cov-2 infection and should not be used as the sole basis for treatment or other patient management decisions. A negative result may occur with  improper specimen collection/handling, submission of specimen other than nasopharyngeal swab, presence of viral mutation(s) within the areas targeted by this assay, and inadequate number of viral copies(<138 copies/mL). A negative result must be combined with clinical observations, patient history, and epidemiological information. The expected result is Negative.  Fact Sheet for Patients:  BloggerCourse.com  Fact Sheet for Healthcare Providers:  SeriousBroker.it  This test is no t yet approved or cleared by the [place] and  has been authorized for detection and/or diagnosis of SARS-CoV-2 by FDA under an Emergency Use Authorization (EUA). This EUA will remain  in effect  (meaning this test can be used) for the duration of the COVID-19 declaration under Section 564(b)(1) of the Act, 21 U.S.C.section 360bbb-3(b)(1), unless the authorization is terminated  or revoked sooner.       Influenza A by PCR NEGATIVE NEGATIVE Final   Influenza B by PCR NEGATIVE NEGATIVE Final    Comment: (NOTE) The Xpert Xpress SARS-CoV-2/FLU/RSV plus assay is intended as an aid in the diagnosis of influenza from Nasopharyngeal swab specimens and should not be used as a sole basis for treatment. Nasal washings and aspirates are unacceptable for Xpert Xpress SARS-CoV-2/FLU/RSV testing.  Fact Sheet for Patients: BloggerCourse.com  Fact Sheet for Healthcare Providers: SeriousBroker.it  This test is not yet approved or cleared by the [place] FDA and has been authorized for detection and/or diagnosis of SARS-CoV-2 by FDA under an Emergency Use Authorization (EUA). This EUA will remain in effect (meaning this test can be used) for the duration of the COVID-19 declaration under Section 564(b)(1) of the Act, 21 U.S.C. section 360bbb-3(b)(1), unless the authorization is terminated or revoked.  Performed at [facility], [address] [place], [place] [address]      Time coordinating discharge: Over 30 minutes  SIGNED:   Huey Bienenstock, MD  [facility] 02/26/2021, 11:38 AM Pager   If 7PM-7AM, please contact night-coverage [email or web address] Password [email or web address]

## 2021-02-26 NOTE — TOC Progression Note (Signed)
Transition of Care [facility]) - Progression Note    Patient Details  Name: Rachel Nolan MRN: [id number] Date of Birth: December 26, 1970  [facility]) CM/SW Contact  [name], Student-Social Work Phone Number: 02/26/2021, 11:54 AM  Clinical Narrative:     CSW intern visited patient at bedside to provide a packet of resources for substance use treatment facilities. The patient expressed her interest and accepted the packet. CSW intern provided supportive counseling and will follow up if any questions arise.  Expected Discharge Plan: Home/Self Care Barriers to Discharge: No Barriers Identified  Expected Discharge Plan and Services Expected Discharge Plan: Home/Self Care   Discharge Planning Services: CM Consult, Follow-up appt scheduled, Medication Assistance, MATCH Program     Expected Discharge Date: 02/26/21               DME Arranged: Oxygen DME Agency: [employer] Date DME Agency Contacted: 02/26/21 Time DME Agency Contacted: 1145 Representative spoke with at DME Agency: Velna Hatchet             Social Determinants of Health (SDOH) Interventions    Readmission Risk Interventions Readmission Risk Prevention Plan 12/04/2019  Transportation Screening Complete  PCP or Specialist Appt within 5-7 Days Complete  Home Care Screening Complete  Medication Review (RN CM) Complete  Some recent data might be hidden

## 2021-02-26 NOTE — Discharge Instructions (Signed)
Follow with Primary MD in 7 days   Get CBC, CMP, 2 view Chest X ray checked  by Primary MD next visit.    Activity: As tolerated with Full fall precautions use walker/cane & assistance as needed   Disposition Home    Diet: Heart Healthy .   On your next visit with your primary care physician please Get Medicines reviewed and adjusted.   Please request your Prim.MD to go over all Hospital Tests and Procedure/Radiological results at the follow up, please get all Hospital records sent to your Prim MD by signing hospital release before you go home.   If you experience worsening of your admission symptoms, develop shortness of breath, life threatening emergency, suicidal or homicidal thoughts you must seek medical attention immediately by calling 911 or calling your MD immediately  if symptoms less severe.  You Must read complete instructions/literature along with all the possible adverse reactions/side effects for all the Medicines you take and that have been prescribed to you. Take any new Medicines after you have completely understood and accpet all the possible adverse reactions/side effects.   Do not drive, operating heavy machinery, perform activities at heights, swimming or participation in water activities or provide baby sitting services if your were admitted for syncope or siezures until you have seen by Primary MD or a Neurologist and advised to do so again.  Do not drive when taking Pain medications.    Do not take more than prescribed Pain, Sleep and Anxiety Medications  Special Instructions: If you have smoked or chewed Tobacco  in the last 2 yrs please stop smoking, stop any regular Alcohol  and or any Recreational drug use.  Wear Seat belts while driving.   Please note  You were cared for by a hospitalist during your hospital stay. If you have any questions about your discharge medications or the care you received while you were in the hospital after you are  discharged, you can call the unit and asked to speak with the hospitalist on call if the hospitalist that took care of you is not available. Once you are discharged, your primary care physician will handle any further medical issues. Please note that NO REFILLS for any discharge medications will be authorized once you are discharged, as it is imperative that you return to your primary care physician (or establish a relationship with a primary care physician if you do not have one) for your aftercare needs so that they can reassess your need for medications and monitor your lab values.  

## 2021-03-23 ENCOUNTER — Encounter (HOSPITAL_COMMUNITY): Payer: Self-pay | Admitting: Emergency Medicine

## 2021-03-23 ENCOUNTER — Emergency Department (HOSPITAL_COMMUNITY)
Admission: EM | Admit: 2021-03-23 | Discharge: 2021-03-28 | Disposition: A | Discharge status: Other Acute Inpatient Hospital | Payer: Self-pay | Attending: Emergency Medicine | Admitting: Emergency Medicine

## 2021-03-23 ENCOUNTER — Other Ambulatory Visit: Payer: Self-pay

## 2021-03-23 ENCOUNTER — Emergency Department (HOSPITAL_COMMUNITY): Payer: Self-pay

## 2021-03-23 DIAGNOSIS — F1020 Alcohol dependence, uncomplicated: Secondary | ICD-10-CM | POA: Insufficient documentation

## 2021-03-23 DIAGNOSIS — Z5900 Homelessness unspecified: Secondary | ICD-10-CM | POA: Insufficient documentation

## 2021-03-23 DIAGNOSIS — Z20822 Contact with and (suspected) exposure to covid-19: Secondary | ICD-10-CM | POA: Insufficient documentation

## 2021-03-23 DIAGNOSIS — R45850 Homicidal ideations: Secondary | ICD-10-CM | POA: Insufficient documentation

## 2021-03-23 DIAGNOSIS — Z79899 Other long term (current) drug therapy: Secondary | ICD-10-CM | POA: Insufficient documentation

## 2021-03-23 DIAGNOSIS — I10 Essential (primary) hypertension: Secondary | ICD-10-CM | POA: Insufficient documentation

## 2021-03-23 DIAGNOSIS — F333 Major depressive disorder, recurrent, severe with psychotic symptoms: Secondary | ICD-10-CM | POA: Insufficient documentation

## 2021-03-23 DIAGNOSIS — J45909 Unspecified asthma, uncomplicated: Secondary | ICD-10-CM | POA: Insufficient documentation

## 2021-03-23 DIAGNOSIS — J45901 Unspecified asthma with (acute) exacerbation: Secondary | ICD-10-CM | POA: Insufficient documentation

## 2021-03-23 DIAGNOSIS — Y900 Blood alcohol level of less than 20 mg/100 ml: Secondary | ICD-10-CM | POA: Insufficient documentation

## 2021-03-23 DIAGNOSIS — R0602 Shortness of breath: Secondary | ICD-10-CM | POA: Insufficient documentation

## 2021-03-23 DIAGNOSIS — R45851 Suicidal ideations: Secondary | ICD-10-CM | POA: Insufficient documentation

## 2021-03-23 DIAGNOSIS — F1494 Cocaine use, unspecified with cocaine-induced mood disorder: Secondary | ICD-10-CM | POA: Insufficient documentation

## 2021-03-23 LAB — TROPONIN I (HIGH SENSITIVITY)
Troponin I (High Sensitivity): 7 ng/L (ref <18)
Troponin I (High Sensitivity): 7 ng/L (ref <18)

## 2021-03-23 LAB — RAPID URINE DRUG SCREEN, HOSP PERFORMED
Amphetamines: NOT DETECTED
Barbiturates: NOT DETECTED
Benzodiazepines: NOT DETECTED
Cocaine: POSITIVE — AB
Opiates: NOT DETECTED
Tetrahydrocannabinol: NOT DETECTED

## 2021-03-23 LAB — RESP PANEL BY RT-PCR (FLU A&B, COVID) ARPGX2
Influenza A by PCR: NEGATIVE
Influenza B by PCR: NEGATIVE
SARS Coronavirus 2 by RT PCR: NEGATIVE

## 2021-03-23 LAB — BASIC METABOLIC PANEL
Anion gap: 10 (ref 5–15)
BUN: 8 mg/dL (ref 6–20)
CO2: 24 mmol/L (ref 22–32)
Calcium: 9.4 mg/dL (ref 8.9–10.3)
Chloride: 104 mmol/L (ref 98–111)
Creatinine, Ser: 0.67 mg/dL (ref 0.44–1.00)
GFR, Estimated: >60 mL/min (ref >60)
Glucose, Bld: 113 mg/dL — ABNORMAL HIGH (ref 70–99)
Potassium: 3.6 mmol/L (ref 3.5–5.1)
Sodium: 138 mmol/L (ref 135–145)

## 2021-03-23 LAB — CBC WITH DIFFERENTIAL/PLATELET
Abs Immature Granulocytes: 0.02 10*3/uL (ref 0.00–0.07)
Basophils Absolute: 0 10*3/uL (ref 0.0–0.1)
Basophils Relative: 0 %
Eosinophils Absolute: 0.2 10*3/uL (ref 0.0–0.5)
Eosinophils Relative: 3 %
HCT: 39.9 % (ref 36.0–46.0)
Hemoglobin: 13.6 g/dL (ref 12.0–15.0)
Immature Granulocytes: 0 %
Lymphocytes Relative: 29 %
Lymphs Abs: 2 10*3/uL (ref 0.7–4.0)
MCH: 31.9 pg (ref 26.0–34.0)
MCHC: 34.1 g/dL (ref 30.0–36.0)
MCV: 93.7 fL (ref 80.0–100.0)
Monocytes Absolute: 0.7 10*3/uL (ref 0.1–1.0)
Monocytes Relative: 10 %
Neutro Abs: 3.9 10*3/uL (ref 1.7–7.7)
Neutrophils Relative %: 58 %
Platelets: 251 10*3/uL (ref 150–400)
RBC: 4.26 MIL/uL (ref 3.87–5.11)
RDW: 15 % (ref 11.5–15.5)
WBC: 6.8 10*3/uL (ref 4.0–10.5)
nRBC: 0 % (ref 0.0–0.2)

## 2021-03-23 LAB — I-STAT VENOUS BLOOD GAS, ED
Acid-Base Excess: 1 mmol/L (ref 0.0–2.0)
Bicarbonate: 25.3 mmol/L (ref 20.0–28.0)
Calcium, Ion: 1.06 mmol/L — ABNORMAL LOW (ref 1.15–1.40)
HCT: 37 % (ref 36.0–46.0)
Hemoglobin: 12.6 g/dL (ref 12.0–15.0)
O2 Saturation: 99 %
Potassium: 3.6 mmol/L (ref 3.5–5.1)
Sodium: 141 mmol/L (ref 135–145)
TCO2: 27 mmol/L (ref 22–32)
pCO2, Ven: 40 mmHg — ABNORMAL LOW (ref 44–60)
pH, Ven: 7.41 (ref 7.25–7.43)
pO2, Ven: 138 mmHg — ABNORMAL HIGH (ref 32–45)

## 2021-03-23 LAB — SALICYLATE LEVEL: Salicylate Lvl: <7 mg/dL — ABNORMAL LOW (ref 7.0–30.0)

## 2021-03-23 LAB — ETHANOL: Alcohol, Ethyl (B): <10 mg/dL (ref <10)

## 2021-03-23 LAB — BRAIN NATRIURETIC PEPTIDE: B Natriuretic Peptide: 7.4 pg/mL (ref 0.0–100.0)

## 2021-03-23 LAB — ACETAMINOPHEN LEVEL: Acetaminophen (Tylenol), Serum: <10 ug/mL — ABNORMAL LOW (ref 10–30)

## 2021-03-23 MED ORDER — METHYLPREDNISOLONE SODIUM SUCC 125 MG IJ SOLR
125.0000 mg | Freq: Once | INTRAMUSCULAR | Status: AC
  Administered 2021-03-23: 125 mg via INTRAVENOUS
  Filled 2021-03-23: qty 2

## 2021-03-23 MED ORDER — IPRATROPIUM-ALBUTEROL 0.5-2.5 (3) MG/3ML IN SOLN
3.0000 mL | RESPIRATORY_TRACT | Status: DC | PRN
  Administered 2021-03-25: 3 mL via RESPIRATORY_TRACT
  Filled 2021-03-23: qty 3

## 2021-03-23 MED ORDER — IPRATROPIUM-ALBUTEROL 0.5-2.5 (3) MG/3ML IN SOLN
3.0000 mL | Freq: Once | RESPIRATORY_TRACT | Status: AC
  Administered 2021-03-23: 3 mL via RESPIRATORY_TRACT
  Filled 2021-03-23: qty 3

## 2021-03-23 MED ORDER — PREDNISONE 20 MG PO TABS
60.0000 mg | ORAL_TABLET | Freq: Every day | ORAL | Status: AC
  Administered 2021-03-24 – 2021-03-26 (×3): 60 mg via ORAL
  Filled 2021-03-23 (×4): qty 3

## 2021-03-23 MED ORDER — MAGNESIUM SULFATE 2 GM/50ML IV SOLN
2.0000 g | Freq: Once | INTRAVENOUS | Status: AC
  Administered 2021-03-23: 2 g via INTRAVENOUS
  Filled 2021-03-23: qty 50

## 2021-03-23 MED ORDER — ALBUTEROL SULFATE (2.5 MG/3ML) 0.083% IN NEBU
12.0000 mL | INHALATION_SOLUTION | Freq: Once | RESPIRATORY_TRACT | Status: AC
  Administered 2021-03-23: 12 mL via RESPIRATORY_TRACT
  Filled 2021-03-23: qty 3

## 2021-03-23 MED ORDER — NICOTINE 21 MG/24HR TD PT24
21.0000 mg | MEDICATED_PATCH | Freq: Every day | TRANSDERMAL | Status: DC
  Administered 2021-03-24 – 2021-03-28 (×4): 21 mg via TRANSDERMAL
  Filled 2021-03-23 (×6): qty 1

## 2021-03-23 NOTE — ED Provider Notes (Cosign Needed)
[facility] EMERGENCY DEPARTMENT Provider Note   CSN: [id number] Arrival date & time: 03/23/21  1321     History  Chief Complaint  Patient presents with   suicidal/SOB    Othel Dicostanzo is a 51 y.o. female.  Ivyanna Sibert is a 51 y.o. female with a history of asthma, hypertension, depression, who presents via EMS for shortness of breath that started this morning.  She reports she has been having a lot of wheezing and shortness of breath both at rest and with exertion.  Wears 2 L nasal cannula at baseline.  Reports she was using an inhaler without much improvement.  Patient reports earlier she had some chest pain and chest tightness that is since resolved.  Reports occasional cough but no fever and no known sick contacts.  Per EMS patient was also found to be wielding a knife and holding a police saying that they were holding her hostage when they arrived on scene.  Patient currently reports suicidal and homicidal ideations but will not give more details regarding a plan.  Denies hallucinations.  The history is provided by the patient and the EMS personnel.      Home Medications Prior to Admission medications   Medication Sig Start Date End Date Taking? Authorizing Provider  albuterol (PROVENTIL) (2.5 MG/3ML) 0.083% nebulizer solution Take 3 mLs (2.5 mg total) by nebulization every 4 (four) hours as needed for wheezing or shortness of breath. 02/26/21   Elgergawy, Leana Roe, MD  albuterol (VENTOLIN HFA) 108 (90 Base) MCG/ACT inhaler Inhale 2 puffs into the lungs every 6 (six) hours as needed. 02/26/21   Elgergawy, Leana Roe, MD  amLODipine (NORVASC) 10 MG tablet Take 1 tablet (10 mg total) by mouth daily. 02/26/21   Elgergawy, Leana Roe, MD  doxycycline (VIBRA-TABS) 100 MG tablet Take 1 tablet (100 mg total) by mouth every 12 (twelve) hours. 02/26/21   Elgergawy, Leana Roe, MD  FLUoxetine (PROZAC) 40 MG capsule Take 1 capsule (40 mg total) by mouth every morning. 02/26/21    Elgergawy, Leana Roe, MD  folic acid (FOLVITE) 1 MG tablet Take 1 tablet (1 mg total) by mouth daily. 02/27/21   Elgergawy, Leana Roe, MD  ibuprofen (ADVIL) 200 MG tablet Take 400 mg by mouth every 6 (six) hours as needed for headache (pain).    [provider]  montelukast (SINGULAIR) 10 MG tablet Take 1 tablet (10 mg total) by mouth at bedtime. 02/26/21   Elgergawy, Leana Roe, MD  nicotine (NICODERM CQ - DOSED IN MG/24 HOURS) 14 mg/24hr patch Place 1 patch (14 mg total) onto the skin daily. Patient not taking: Reported on 02/23/2021 09/27/20   Lamar Sprinkles, MD  prazosin (MINIPRESS) 1 MG capsule Take 3 capsules (3 mg total) by mouth at bedtime. 02/26/21   Elgergawy, Leana Roe, MD  predniSONE (STERAPRED UNI-PAK 21 TAB) 10 MG (21) TBPK tablet Use per pack instruction 02/26/21   Elgergawy, Leana Roe, MD  QUEtiapine (SEROQUEL) 200 MG tablet Take 1 tablet (200 mg total) by mouth at bedtime. Patient not taking: Reported on 02/23/2021 09/27/20   Lamar Sprinkles, MD  citalopram (CELEXA) 20 MG tablet Take 1 tablet (20 mg total) by mouth daily. 03/07/19 07/23/19  Aldean Baker, NP  lisinopril (ZESTRIL) 10 MG tablet Take 1 tablet (10 mg total) by mouth daily. 03/07/19 09/11/19  Aldean Baker, NP  pantoprazole (PROTONIX) 40 MG tablet Take 1 tablet (40 mg total) by mouth daily. 03/07/19 07/23/19  Aldean Baker, NP  Allergies    Patient has no known allergies.    Review of Systems   Review of Systems  Constitutional:  Negative for chills and fever.  HENT: Negative.    Respiratory:  Positive for cough, chest tightness, shortness of breath and wheezing.   Cardiovascular:  Positive for chest pain. Negative for leg swelling.  Gastrointestinal:  Negative for abdominal pain, nausea and vomiting.  Genitourinary:  Negative for dysuria and frequency.  Musculoskeletal:  Negative for arthralgias and myalgias.  Skin:  Negative for color change and wound.  Neurological:  Negative for dizziness, syncope and  light-headedness.  Psychiatric/Behavioral:  Positive for agitation and suicidal ideas. Negative for hallucinations.   All other systems reviewed and are negative.  Physical Exam Updated Vital Signs BP (!) 165/88    Pulse 86    Temp 97.9 F (36.6 C) (Oral)    Resp (!) 22    LMP 06/19/2018 (Exact Date)    SpO2 98%  Physical Exam Vitals and nursing note reviewed.  Constitutional:      General: She is not in acute distress.    Appearance: Normal appearance. She is well-developed. She is not diaphoretic.     Comments: Somnolent but easily arousable with verbal stimuli and then able to answer questions, chronically ill-appearing but not in acute distress  HENT:     Head: Normocephalic and atraumatic.     Mouth/Throat:     Mouth: Mucous membranes are moist.     Pharynx: Oropharynx is clear.  Eyes:     General:        Right eye: No discharge.        Left eye: No discharge.  Cardiovascular:     Rate and Rhythm: Normal rate and regular rhythm.     Pulses: Normal pulses.     Heart sounds: Normal heart sounds.  Pulmonary:     Effort: Pulmonary effort is normal. No respiratory distress.     Breath sounds: Wheezing present. No rales.     Comments: Respirations equal and unlabored, patient able to speak in full sentences, lungs with diffuse expiratory wheezing, occasional cough, no rales or rhonchi Abdominal:     General: Bowel sounds are normal. There is no distension.     Palpations: Abdomen is soft. There is no mass.     Tenderness: There is no abdominal tenderness. There is no guarding.     Comments: Abdomen soft, nondistended, nontender to palpation in all quadrants without guarding or peritoneal signs  Musculoskeletal:        General: No deformity.     Cervical back: Neck supple.  Skin:    General: Skin is warm and dry.     Capillary Refill: Capillary refill takes less than 2 seconds.  Neurological:     Mental Status: She is oriented to person, place, and time.     Coordination:  Coordination normal.     Comments: Speech is clear, able to follow commands Moves extremities without ataxia, coordination intact  Psychiatric:        Attention and Perception: Attention normal. She does not perceive auditory or visual hallucinations.        Mood and Affect: Mood is depressed. Affect is labile.        Speech: She is noncommunicative.        Thought Content: Thought content includes homicidal and suicidal ideation. Thought content does not include homicidal or suicidal plan.    ED Results / Procedures / Treatments   Labs (all labs  ordered are listed, but only abnormal results are displayed) Labs Reviewed  BASIC METABOLIC PANEL - Abnormal; Notable for the following components:      Result Value   Glucose, Bld 113 (*)    All other components within normal limits  SALICYLATE LEVEL - Abnormal; Notable for the following components:   Salicylate Lvl <7.0 (*)    All other components within normal limits  ACETAMINOPHEN LEVEL - Abnormal; Notable for the following components:   Acetaminophen (Tylenol), Serum <10 (*)    All other components within normal limits  I-STAT VENOUS BLOOD GAS, ED - Abnormal; Notable for the following components:   pCO2, Ven 40.0 (*)    pO2, Ven 138 (*)    Calcium, Ion 1.06 (*)    All other components within normal limits  RESP PANEL BY RT-PCR (FLU A&B, COVID) ARPGX2  CBC WITH DIFFERENTIAL/PLATELET  BRAIN NATRIURETIC PEPTIDE  ETHANOL  RAPID URINE DRUG SCREEN, HOSP PERFORMED  TROPONIN I (HIGH SENSITIVITY)  TROPONIN I (HIGH SENSITIVITY)    EKG None  Radiology DG Chest 2 View  Result Date: 03/23/2021 CLINICAL DATA:  Dyspnea EXAM: CHEST - 2 VIEW COMPARISON:  02/23/2021 chest radiograph. FINDINGS: Posterior right thoracolumbar spinal fusion rod partially visualized. Stable cardiomediastinal silhouette with normal heart size. No pneumothorax. No pleural effusion. Mildly hyperinflated lungs. No pulmonary edema. No acute consolidative airspace  disease. IMPRESSION: Mildly hyperinflated lungs, cannot exclude obstructive lung disease. Otherwise no active cardiopulmonary disease. Electronically Signed   By: Delbert Phenix M.D.   On: 03/23/2021 14:16    Procedures Procedures    Medications Ordered in ED Medications  ipratropium-albuterol (DUONEB) 0.5-2.5 (3) MG/3ML nebulizer solution 3 mL (3 mLs Nebulization Given 03/23/21 1616)  methylPREDNISolone sodium succinate (SOLU-MEDROL) 125 mg/2 mL injection 125 mg (125 mg Intravenous Given 03/23/21 1608)  magnesium sulfate IVPB 2 g 50 mL (0 g Intravenous Stopped 03/23/21 1904)  albuterol (PROVENTIL) (2.5 MG/3ML) 0.083% nebulizer solution 12 mL (12 mLs Nebulization Given 03/23/21 1739)    ED Course/ Medical Decision Making/ A&P                           This patient presents to the ED for concern of shortness of breath, wheezing, suicidal and homicidal ideation, this involves an extensive number of treatment options, and is a complaint that carries with it a high risk of complications and morbidity.  The differential diagnosis includes asthma exacerbation, CHF, pneumonia, COVID, flu, COPD, acute psychiatric decompensation   Co morbidities that complicate the patient evaluation  Asthma, polysubstance abuse, depression, anxiety, tobacco use   Additional history obtained:  Additional history obtained from EMS personnel  External records from outside source obtained and reviewed including Recent hospital admission for asthma exacerbation   Lab Tests:  I Ordered, and personally interpreted labs.  The pertinent results include: No leukocytosis, normal hemoglobin, no significant electrolyte derangements, negative troponin x2, BNP negative, negative COVID and flu testing.  VBG without evidence of CO2 retention, UDS positive for cocaine, tox labs otherwise negative   Imaging Studies ordered:  I ordered imaging studies including chest x-ray I independently visualized and interpreted imaging which  showed mild hyperinflation, likely due to obstructive lung disease but no other active cardiopulmonary disease I agree with the radiologist interpretation   Cardiac Monitoring:  The patient was maintained on a cardiac monitor.  I personally viewed and interpreted the cardiac monitored which showed an underlying rhythm of: NSR   Medicines ordered and prescription  drug management:  I ordered medication including DuoNeb followed by continuous albuterol treatments as well as Solu-Medrol and mag for asthma exacerbation Reevaluation of the patient after these medicines showed that the patient improved I have reviewed the patients home medicines and have made adjustments as needed    Critical Interventions:  Treatment for asthma exacerbation   Consultations Obtained:  I requested consultation with TTS, awaiting psych evaluation and recommendations   Problem List / ED Course:  Patient initially presented with shortness of breath and wheezing, presentation consistent with asthma exacerbation and the rest of patient's lab work has been reassuring, chest x-ray without pneumonia, or edema, VBG without CO2 retention Patient treated with DuoNeb followed by hour of continuous albuterol, steroids and magnesium and feels that her breathing has improved, maintaining normal sats on her home 2 L nasal cannula Ambulated in the department and maintained normal O2 sats Given improvement feel patient is medically cleared at this time and appropriate for psychiatric evaluation given continued reports of suicidal and homicidal ideations, she was found to be wielding a knife at police when EMS arrived. The patient has been placed in psychiatric observation due to the need to provide a safe environment for the patient while obtaining psychiatric consultation and evaluation, as well as ongoing medical and medication management to treat the patient's condition.  The patient has not been placed under full IVC at  this time.   Social Determinants of Health:  Polysubstance abuse   Disposition: Patient will remain in ED voluntarily for psychiatric evaluation, medically cleared, DuoNebs and steroids ordered for continued treatment of asthma exacerbation as would be treated if patient were discharged home.        Final Clinical Impression(s) / ED Diagnoses Final diagnoses:  Exacerbation of asthma, unspecified asthma severity, unspecified whether persistent  Suicidal ideations  Homicidal ideations    Rx / DC Orders ED Discharge Orders     None         Dartha Lodge, [place] 03/24/21 0023

## 2021-03-23 NOTE — ED Notes (Signed)
Pt oxygen while ambulated was 93% ?

## 2021-03-23 NOTE — ED Provider Triage Note (Signed)
Emergency Medicine Provider Triage Evaluation Note ? ?Rachel Nolan , a 51 y.o. female  was evaluated in triage.  Pt complains of shortness of breath.  Patient presents the emergency department for shortness of breath that started earlier today.  She says she experiences it at rest and with exertion.  She is on 2 L of nasal cannula oxygen at home and does not know why.  She does have a history of asthma, but denies any COPD.  She says she feels like she is wheezing a lot.  She is been using her inhaler with no relief.  She has not had any cough, fever, cold symptoms.  She has some chest pain in the middle of her chest that hurts when she takes deep breaths.  She denies any other symptoms such as nausea, vomiting, abdominal pain, visual changes, numbness, weakness. ? ?Of note, per EMS, when they arrived to pick her up.  Apparently patient brandishing a knife and holding at police saying that they are holding me hostage.  They were unsure who she was referring to.  Patient denies any suicidal or homicidal ideations at this time. ? ?Review of Systems  ?Positive:  ?Negative:  ? ?Physical Exam  ?BP (!) 140/100 (BP Location: Right Arm)   Pulse 91   Temp 97.9 ?F (36.6 ?C) (Oral)   Resp 20   LMP 06/19/2018 (Exact Date)   SpO2 95%  ?Gen:   Awake, no distress   ?Resp:  Normal effort  ?MSK:   Moves extremities without difficulty  ?Other:  Lungs with wheezing bilaterally in all fields. Breathing labored at rest. ? ?Medical Decision Making  ?Medically screening exam initiated at 1:50 PM.  Appropriate orders placed.  Larrisa Cravey was informed that the remainder of the evaluation will be completed by another provider, this initial triage assessment does not replace that evaluation, and the importance of remaining in the ED until their evaluation is complete. ? ? ?  ?Claudie Leach, PA-C ?03/23/21 1353 ? ?

## 2021-03-23 NOTE — ED Triage Notes (Signed)
Patient BIB [facility] from home with complaint of shortness of breath and chest pain for the last two weeks. On EMS arrival to home, patient was brandishing a knife at police and stating "They're holding me hostage", non-specific to who she is referring to. Patient admitted approximately one month ago for severe asthma exacerbation, has home O2 and BiPAP. Patient alert and cooperative at this time. ?

## 2021-03-23 NOTE — ED Notes (Signed)
Ambulates to restroom to change into purple scrubs. Valuables gathered and bagged. Minimal assistance getting to and from bathroom, remains on oxygen.  ?

## 2021-03-24 ENCOUNTER — Other Ambulatory Visit (HOSPITAL_COMMUNITY): Payer: Self-pay

## 2021-03-24 MED ORDER — PRAZOSIN HCL 2 MG PO CAPS
3.0000 mg | ORAL_CAPSULE | Freq: Every day | ORAL | Status: DC
  Administered 2021-03-24 – 2021-03-27 (×4): 3 mg via ORAL
  Filled 2021-03-24 (×6): qty 1

## 2021-03-24 MED ORDER — FLUOXETINE HCL 20 MG PO CAPS
40.0000 mg | ORAL_CAPSULE | Freq: Every morning | ORAL | Status: DC
  Administered 2021-03-25 – 2021-03-28 (×4): 40 mg via ORAL
  Filled 2021-03-24 (×4): qty 2

## 2021-03-24 MED ORDER — FLUOXETINE HCL 40 MG PO CAPS: 40.0000 mg | ORAL_CAPSULE | Freq: Every morning | ORAL | Status: DC

## 2021-03-24 NOTE — ED Notes (Signed)
RN called for transport  ?

## 2021-03-24 NOTE — Progress Notes (Signed)
Patient has been denied by [facility] due to acuity. Patient meets [facility] inpatient criteria per Dr. Lucianne Muss. Patient has been faxed out to the following facilities:  ? ?[facility]  [address] [place] [address] [phone]  ?[facility]  [address] [place] [address] [phone]  ?[facility]  [address] [place] [address] [phone]  ?[facility]  [address] [place] [address] [phone]  ?[facility]  [address] [place], [place] [address] [phone]  ?[facility]  [address] [place], [place] [address] [phone]  ?[facility]  [address] [place], [place] [address] [phone]  ?[facility]  [address] [place] [address] [phone]  ?[facility]  [address] [place] [address] [phone]  ?[facility]  [address] [place] [address] [phone]  ?[facility]  [address], [place] [address] [phone]  ?[facility]  [address], [place] [address] [phone]  ?[facility]  [address], [place] [address] [phone]  ?[facility]  [address], [place] [address] [phone]  ?[facility]  [address] [place] [address] [phone]  ?[facility]  [address] [place], [place] [address] [phone]  ?[facility]  [address] [place], [place] [address] [phone]  ?[facility]  [address] [place]  [address] [phone]  ?[facility]  [address] [place] [address] [phone]  ?[facility]  [address], [place] [address] [phone]  ?[facility]  [address], [place] [address] [phone]  ?[facility]  [address] [place] [address] [phone]  ? ?Damita Dunnings, MSW, LCSW-A  ?10:50 AM 03/24/2021   ?

## 2021-03-24 NOTE — ED Notes (Signed)
Pt ambulatory to the bathroom and back to stretcher without assistance. Pt denies any complaints, wants something to eat. Pt given sandwich bag, peanut butter, crackers, and soda per request. No acute changes noted. Pt calm, cooperative, and appropriate. Will continue to monitor.  ?

## 2021-03-24 NOTE — ED Notes (Signed)
RN called [facility] where pt is supposed to go and [place], NP states they can not accept pt on oxygen. Social work notified.  ?

## 2021-03-24 NOTE — ED Provider Notes (Signed)
Emergency Medicine Observation Re-evaluation Note ? ?Nastasha Victoria is a 51 y.o. female, seen on rounds today.  Pt initially presented to the ED for complaints of behavioral health eval.  Pt currently alert, content, no distress.  ? ?Physical Exam  ?BP (!) 133/94 (BP Location: Left Arm)   Pulse 86   Temp 97.9 ?F (36.6 ?C) (Oral)   Resp (!) 21   LMP 06/19/2018 (Exact Date)   SpO2 96%  ?Physical Exam ?General: alert, content.  ?Cardiac: regular rate ?Lungs: breathing comfortably ?Psych: calm, alert. Cooperative.  ? ?ED Course / MDM  ? ? ?I have reviewed the labs performed to date as well as medications administered while in observation.  Recent changes in the last 24 hours include ED obs, [facility] consultation, med management.  ? ?Plan  ?Patient has been accepted to go to inpatient psych at [facility] this afternoon/ Dr Franchot Mimes accepting physician.  ? ?Pt appears stable for transport/transfer.  ? ? ?  ?Lajean Saver, MD ?03/24/21 1323 ? ?

## 2021-03-24 NOTE — ED Notes (Signed)
Belongings inventoried and bags placed in locker 13 of purple zone. Valuables locked up by security (phone and wallet). ?

## 2021-03-24 NOTE — Discharge Instructions (Addendum)
Transfer/transport to [facility] for inpatient psychiatric treatment.  ?
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
## 2021-03-24 NOTE — ED Notes (Signed)
RN switched pts O2 tank  ?

## 2021-03-24 NOTE — ED Provider Notes (Signed)
Emergency Medicine Observation Re-evaluation Note ? ?Rachel Nolan is a 51 y.o. female, seen on rounds today.  Pt initially presented to the ED for complaints of suicidal/SOB ?Currently, the patient is asleep. ? ?Physical Exam  ?BP 126/84   Pulse 84   Temp 97.9 ?F (36.6 ?C) (Oral)   Resp 20   LMP 06/19/2018 (Exact Date)   SpO2 98%  ?Physical Exam ?General: asleep ?Cardiac: rrr ?Lungs: cta b ?Psych: asleep ? ?ED Course / MDM  ?EKG:  ? ?I have reviewed the labs performed to date as well as medications administered while in observation.  Recent changes in the last 24 hours include TTS eval with rec of inpatient placement. ? ?Plan  ?Current plan is for awaiting placement. ? Rachel Nolan is not under involuntary commitment. ? ? ?  ?Rachel Lefevre, MD ?03/24/21 [id number] ? ?

## 2021-03-24 NOTE — ED Notes (Signed)
RN switched out O2 tanks  ?

## 2021-03-24 NOTE — ED Notes (Signed)
Breakfast Order placed ?

## 2021-03-24 NOTE — ED Notes (Signed)
RN cancelled transport  ?

## 2021-03-24 NOTE — ED Notes (Signed)
Pt signed voluntary consent to be transported. Paperwork faxed.  ?

## 2021-03-24 NOTE — Progress Notes (Signed)
[facility] LCSW Progress Note ?  ?03/24/2021    11:33 AM ? ?Delia Heady  ? ?[id number]  ? ?Type of Contact and Topic:  Psychiatric Bed Placement  ? ?Pt accepted to [facility]   ? ?Patient meets inpatient criteria per Dr. Dwyane Dee  ? ?The attending provider will be Dr. Franchot Mimes ? ?Call report to [phone] ? ?Elsie Lincoln, RN @ [facility] ED notified.    ? ?Pt scheduled  to arrive at [place]. Please fax voluntary consent to [phone].  ? ? ?Mariea Clonts, MSW, LCSW-A  ?11:35 AM 03/24/2021   ?  ? ?  ?  ? ? ? ? ?  ?

## 2021-03-24 NOTE — ED Notes (Signed)
RN attempted report to [facility]. They stated RN can not give report until transport is here.  ?

## 2021-03-25 ENCOUNTER — Encounter (HOSPITAL_COMMUNITY): Payer: Self-pay

## 2021-03-25 MED ORDER — OXYCODONE-ACETAMINOPHEN 5-325 MG PO TABS
1.0000 | ORAL_TABLET | Freq: Once | ORAL | Status: AC
  Administered 2021-03-25: 1 via ORAL
  Filled 2021-03-25: qty 1

## 2021-03-25 MED ORDER — ACETAMINOPHEN 325 MG PO TABS
650.0000 mg | ORAL_TABLET | Freq: Four times a day (QID) | ORAL | Status: DC | PRN
  Administered 2021-03-25 – 2021-03-27 (×4): 650 mg via ORAL
  Filled 2021-03-25 (×4): qty 2

## 2021-03-25 NOTE — Progress Notes (Addendum)
Pt unable to transfer to [facility] due to pet using O2. Pt denied by [facility], DO due to be being not appropriate for their unit. CSW will assist with placement.  ? ? ?Maryjean Ka, MSW, LCSWA ?03/25/2021 9:43 PM ? ? ?

## 2021-03-25 NOTE — ED Notes (Signed)
Breakfast order placed ?

## 2021-03-25 NOTE — ED Notes (Signed)
Replaced pts O2 tank °

## 2021-03-25 NOTE — ED Notes (Signed)
Patient resting comfortably at this time. Denies complaints.  ?

## 2021-03-25 NOTE — ED Provider Notes (Signed)
Emergency Medicine Observation Re-evaluation Note ? ?Apple Dearmas is a 51 y.o. female, seen on rounds today.  Pt initially presented to the ED for complaints of suicidal/SOB ?Currently, the patient is resting. ? ?Physical Exam  ?BP (!) 139/95 (BP Location: Right Arm)   Pulse 100   Temp 98.5 ?F (36.9 ?C) (Oral)   Resp 20   LMP 06/19/2018 (Exact Date)   SpO2 100%  ?Physical Exam ?General: calm ?Cardiac: warm, well perfused ?Lungs: even unlabored ?Psych: calm right now ? ?ED Course / MDM  ?EKG:EKG Interpretation ? ?Date/Time:  Monday March 23 2021 13:29:11 EST ?Ventricular Rate:  84 ?PR Interval:  154 ?QRS Duration: 84 ?QT Interval:  396 ?QTC Calculation: 467 ?R Axis:   29 ?Text Interpretation: Sinus rhythm with marked sinus arrhythmia Minimal voltage criteria for LVH, may be normal variant ( Cornell product ) Possible Anterior infarct , age undetermined Abnormal ECG When compared with ECG of 23-Feb-2021 11:58, PREVIOUS ECG IS PRESENT Confirmed by Marily Memos [phone]) on 03/25/2021 5:54:08 AM ? ?I have reviewed the labs performed to date as well as medications administered while in observation.  Recent changes in the last 24 hours include per RN note, declined by [facility] due to pt being on chronic o2. ? ?Plan  ?Current plan is for placement per psych team. ? Airica Schwartzkopf is not under involuntary commitment. ? ? ?  ?Milagros Loll, MD ?03/25/21 [phone] ? ?

## 2021-03-26 DIAGNOSIS — F1494 Cocaine use, unspecified with cocaine-induced mood disorder: Secondary | ICD-10-CM

## 2021-03-26 MED ORDER — GABAPENTIN 100 MG PO CAPS
200.0000 mg | ORAL_CAPSULE | Freq: Three times a day (TID) | ORAL | Status: DC
  Administered 2021-03-26 – 2021-03-28 (×7): 200 mg via ORAL
  Filled 2021-03-26 (×7): qty 2

## 2021-03-26 MED ORDER — PENICILLIN V POTASSIUM 250 MG PO TABS
500.0000 mg | ORAL_TABLET | Freq: Three times a day (TID) | ORAL | Status: DC
  Administered 2021-03-26 – 2021-03-28 (×7): 500 mg via ORAL
  Filled 2021-03-26 (×11): qty 2

## 2021-03-26 NOTE — Consult Note (Signed)
Telepsych Consultation   Reason for Consult:   Psychiatric Reassessment Referring Physician:  Benedetto Goad, PA-C Location of Patient:   [facility] Location of Provider: Other: virtual home office  Patient Identification: Rachel Nolan MRN:  [id number] Principal Diagnosis: Cocaine-induced mood disorder ([place]) Diagnosis:  Principal Problem:   Cocaine-induced mood disorder ([place]) Active Problems:   Alcohol use disorder, severe, dependence ([place])   Homeless   Total Time spent with patient: 30 minutes  Subjective:   Rachel Nolan is a 51 y.o. female patient admitted with suicidal ideations with plan and intent for self harm.  When asked how she's doing today she states, "I'm so so, having bad nightmares about guns and not sleeping well."  HPI:   Patient seen via telepsych by this provider; chart reviewed and consulted with Dr. Dwyane Dee on 03/26/21.  On evaluation Rachel Nolan is laying on hospital bed.  She is fairly groomed in hospital scrubs, and has a blanket covering her mouth. When asked about this she states she has a tooth ache and the air bothers her mouth. Otherwise, she is clear and coherent, A&Ox4.  She appears depressed, continues to endorse feelings of worthlessness, hopelessness and despair related to her psychosocial situation--homelessness and chronic medical concerns. Her sleep continues to be interrupted by nightmares despite being prescribed prazosin 3mg  po qhs. She is unsure if prazosin has decreased the severity or amount of nightmares since usage.  She takes fluoxetine 40mg  po daily, as prescribed for depressive symptoms but does not feel this has decreased the severity of sadness or suicidal ideations.  Patient has history for chronic substance abuse, admission UDS was + for cocaine and intermittent UDS that dates back to 2021 are all positive for cocaine.  This was discussed with the patient and also reviewed how illicit substances can interfere with therapeutic response  of psychiatric medications.  Patient now appears irritated and states she does not have a drug problem and she only uses "crack cocaine" for recreational purposes.  States she can stop whenever she wants. She was not placed on CIWA protocol on admission and currently denies drug withdrawal symptoms.  She constantly moves her leg in bed, but states this is intentional as she is trying to deal with her toothache.  Has a hx for alcohol abuse as well but denies hx of complicated withdrawal symptoms, or seizures.  Denies GI concerns, appetite is good.    Patient continues to endorse suicidal ideations with plan and intent to hurt herself if discharged from the hospital today.    Past Psychiatric History: polysubstance abuse, MDD  Risk to Self:  yes Risk to Others:  no Prior Inpatient Therapy: unknown  Prior Outpatient Therapy:  unknown  Past Medical History:  Past Medical History:  Diagnosis Date   Anxiety    Asthma    Depression    Hypertension    Scoliosis     Past Surgical History:  Procedure Laterality Date   BACK SURGERY     ECTOPIC PREGNANCY SURGERY     Family History:  Family History  Problem Relation Age of Onset   Hypertension Mother    Hypertension Father    Family Psychiatric  History: unknown Social History:  Social History   Substance and Sexual Activity  Alcohol Use Yes   Comment: one 40 oz per month     Social History   Substance and Sexual Activity  Drug Use Yes   Types: Marijuana, Cocaine, "Crack" cocaine    Social History  Socioeconomic History   Marital status: Widowed    Spouse name: Not on file   Number of children: Not on file   Years of education: Not on file   Highest education level: Not on file  Occupational History   Not on file  Tobacco Use   Smoking status: Every Day    Packs/day: 1.00    Types: Cigarettes   Smokeless tobacco: Never  Vaping Use   Vaping Use: Never used  Substance and Sexual Activity   Alcohol use: Yes     Comment: one 40 oz per month   Drug use: Yes    Types: Marijuana, Cocaine, "Crack" cocaine   Sexual activity: Not Currently  Other Topics Concern   Not on file  Social History Narrative   Pt is homeless, no fixed address; not followed by an outpatient psychiatrist   Social Determinants of Health   Financial Resource Strain: Not on file  Food Insecurity: Not on file  Transportation Needs: Not on file  Physical Activity: Not on file  Stress: Not on file  Social Connections: Not on file   Additional Social History:    Allergies:  No Known Allergies  Labs: No results found for this or any previous visit (from the past 48 hour(s)).  Medications:  Current Facility-Administered Medications  Medication Dose Route Frequency Provider Last Rate Last Admin   acetaminophen (TYLENOL) tablet 650 mg  650 mg Oral Q6H PRN Lucrezia Starch, MD   650 mg at 03/26/21 0541   FLUoxetine (PROZAC) capsule 40 mg  40 mg Oral q morning Prescilla Sours, PA-C   40 mg at 03/26/21 0944   gabapentin (NEURONTIN) capsule 200 mg  200 mg Oral TID Merlyn Lot E, NP   200 mg at 03/26/21 1136   ipratropium-albuterol (DUONEB) 0.5-2.5 (3) MG/3ML nebulizer solution 3 mL  3 mL Nebulization Q4H PRN Jacqlyn Larsen, PA-C   3 mL at 03/25/21 [id number]   nicotine (NICODERM CQ - dosed in mg/24 hours) patch 21 mg  21 mg Transdermal Daily Benedetto Goad N, PA-C   21 mg at 03/26/21 0944   penicillin v potassium (VEETID) tablet 500 mg  500 mg Oral Q8H Hayden Rasmussen, MD   500 mg at 03/26/21 0944   prazosin (MINIPRESS) capsule 3 mg  3 mg Oral QHS Margorie John W, PA-C   3 mg at 03/25/21 2201   predniSONE (DELTASONE) tablet 60 mg  60 mg Oral Q breakfast Jacqlyn Larsen, [place]   60 mg at 03/26/21 [id number]   Current Outpatient Medications  Medication Sig Dispense Refill   albuterol (PROVENTIL) (2.5 MG/3ML) 0.083% nebulizer solution Take 3 mLs (2.5 mg total) by nebulization every 4 (four) hours as needed for wheezing or shortness of breath. 75  mL 0   albuterol (VENTOLIN HFA) 108 (90 Base) MCG/ACT inhaler Inhale 2 puffs into the lungs every 6 (six) hours as needed. (Patient taking differently: Inhale 2 puffs into the lungs every 6 (six) hours as needed for shortness of breath or wheezing.) 8.5 g 0   amLODipine (NORVASC) 10 MG tablet Take 1 tablet (10 mg total) by mouth daily. 30 tablet 0   FLUoxetine (PROZAC) 40 MG capsule Take 1 capsule (40 mg total) by mouth every morning. 30 capsule 0   folic acid (FOLVITE) 1 MG tablet Take 1 tablet (1 mg total) by mouth daily.     ibuprofen (ADVIL) 200 MG tablet Take 200 mg by mouth every 6 (six) hours as needed for  headache (pain).     montelukast (SINGULAIR) 10 MG tablet Take 1 tablet (10 mg total) by mouth at bedtime. 30 tablet 0   NON FORMULARY CPAP at bedtime     OXYGEN Inhale 2 L into the lungs continuous.     prazosin (MINIPRESS) 1 MG capsule Take 3 capsules (3 mg total) by mouth at bedtime. 90 capsule 0   QUEtiapine (SEROQUEL) 200 MG tablet Take 1 tablet (200 mg total) by mouth at bedtime. 30 tablet 0   doxycycline (VIBRA-TABS) 100 MG tablet Take 1 tablet (100 mg total) by mouth every 12 (twelve) hours. (Patient not taking: Reported on 03/24/2021) 10 tablet 0   nicotine (NICODERM CQ - DOSED IN MG/24 HOURS) 14 mg/24hr patch Place 1 patch (14 mg total) onto the skin daily. (Patient not taking: Reported on 02/23/2021) 28 patch 0   predniSONE (STERAPRED UNI-PAK 21 TAB) 10 MG (21) TBPK tablet Use per pack instruction (Patient not taking: Reported on 03/24/2021) 21 tablet 0    Musculoskeletal: limited assessment via tele-psychiatry visit but patient able to move all extremities and per nursing notes ambulates independently.   Strength & Muscle Tone: within normal limits Gait & Station: normal Patient leans: N/A    Psychiatric Specialty Exam:  Presentation  General Appearance: Appropriate for Environment; Fairly Groomed  Eye Contact:Fair  Speech:Clear and Coherent  Speech  Volume:Normal  Handedness:Right   Mood and Affect  Mood:Hopeless; Dysphoric  Affect:Congruent; Depressed   Thought Process  Thought Processes:Coherent  Descriptions of Associations:Intact  Orientation:Full (Time, Place and Person)  Thought Content:Rumination  History of Schizophrenia/Schizoaffective disorder:No  Duration of Psychotic Symptoms:N/A  Hallucinations:Hallucinations: None  Ideas of Reference:None  Suicidal Thoughts:Suicidal Thoughts: Yes, Passive SI Passive Intent and/or Plan: With Intent; With Plan; With Means to [place]  Homicidal Thoughts:Homicidal Thoughts: No   Sensorium  Memory:Immediate Good; Recent Good; Remote Good  Judgment:Impaired  Insight:Lacking   Executive Functions  Concentration:Fair  Attention Span:Fair  Recall:Good  Fund of Knowledge:Good  Language:Good   Psychomotor Activity  Psychomotor Activity:Psychomotor Activity: Increased (patient frequently moving legs in bed, denies IVM, states it is because her tooth hurts)   Assets  Assets:Communication Skills; Desire for Improvement   Sleep  Sleep:Sleep: Fair Number of Hours of Sleep: 6    Physical Exam: Physical Exam Constitutional:      Appearance: Normal appearance.  Musculoskeletal:     Cervical back: Normal range of motion.  Neurological:     General: No focal deficit present.     Mental Status: She is alert and oriented to person, place, and time.  Psychiatric:        Attention and Perception: Attention and perception normal.        Mood and Affect: Mood is anxious and depressed.        Speech: Speech normal.        Behavior: Behavior normal. Behavior is cooperative.        Thought Content: Thought content is not paranoid or delusional. Thought content includes suicidal ideation. Thought content does not include homicidal ideation. Thought content includes suicidal plan. Thought content does not include homicidal plan.        Cognition and Memory:  Cognition and memory normal.        Judgment: Judgment is impulsive and inappropriate.   ROS Blood pressure (!) 149/85, pulse 62, temperature 98 F (36.7 C), resp. rate 18, height 5\' 6"  (1.676 m), weight 86.2 kg, last menstrual period 06/19/2018, SpO2 98 %. Body mass index is 30.67  kg/m.  Treatment Plan Summary: Patient continues to endorses suicidal ideations, with plan and intent if discharged from the hospital today.  She has no protective factors and will not contract for safety.  She is recommended for gero psych admission; [facility] do not have a appropriate beds; she's been faxed out to several facilities but has not been accepted yet.  SW will continue to fax her out at this time.  Daily contact with patient to assess and evaluate symptoms and progress in treatment and Medication management.  Although she denies withdrawal symptoms, will add gabapentin to help with potential withdrawal symptoms as well as depression and anxiety.  Above was discussed with patient concordance.   Continue the following Medications: Fluoxetine 40mg  po daily for mood Prazosin 3mg  po qhs for nightmares     Start: Gabapentin 200mg  po TID for withdrawals symptoms and  adjunct for mood   Disposition: Recommend psychiatric Inpatient admission when medically cleared.  This service was provided via telemedicine using a 2-way, interactive audio and video technology.  Names of all persons participating in this telemedicine service and their role in this encounter. Name: Andrika Quevedo Role: Patient  Name: Merlyn Lot Role: PMHNP  Name: Dr. [place] Abbot Role: Psychiatrist    Mallie Darting, NP 03/26/2021 12:11 PM

## 2021-03-26 NOTE — ED Provider Notes (Signed)
Emergency Medicine Observation Re-evaluation Note ? ?Rachel Nolan is a 51 y.o. female, seen on rounds today.  Pt initially presented to the ED for complaints of suicidal/SOB ?Currently, the patient is resting in bed.. ? ?Physical Exam  ?BP (!) 149/85 (BP Location: Right Arm)   Pulse 62   Temp 98 ?F (36.7 ?C)   Resp 18   Ht 5\' 6"  (1.676 m)   Wt 86.2 kg   LMP 06/19/2018 (Exact Date)   SpO2 98%   BMI 30.67 kg/m?  ?Physical Exam ?General: No acute distress ?Cardiac: Well-perfused ?Lungs: Nonlabored ?Psych: Cooperative ?Patient also complaining of some left upper dental pain.  Tylenol not helping.  Denies any fevers.  No obvious abscess on exam no trismus. ?ED Course / MDM  ?EKG:EKG Interpretation ? ?Date/Time:  Monday March 23 2021 13:29:11 EST ?Ventricular Rate:  84 ?PR Interval:  154 ?QRS Duration: 84 ?QT Interval:  396 ?QTC Calculation: 467 ?R Axis:   29 ?Text Interpretation: Sinus rhythm with marked sinus arrhythmia Minimal voltage criteria for LVH, may be normal variant ( Cornell product ) Possible Anterior infarct , age undetermined Abnormal ECG When compared with ECG of 23-Feb-2021 11:58, PREVIOUS ECG IS PRESENT Confirmed by Merrily Pew [phone]) on 03/25/2021 5:54:08 AM ? ?I have reviewed the labs performed to date as well as medications administered while in observation.  Recent changes in the last 24 hours include dental pain. ? ?Plan  ?Current plan is for inpatient psychiatric placement.. ? Rachel Nolan is not under involuntary commitment. ?We will start on antibiotics. ? ? ?  ?Hayden Rasmussen, MD ?03/26/21 2036 ? ?

## 2021-03-26 NOTE — Progress Notes (Signed)
Patient has been denied by [facility] due to acuity. Patient meets [facility] inpatient criteria per Dr. Lucianne Muss. Patient has been faxed out to the following facilities:  ? ? ?[facility]  [address], [place] [address] [phone]  ?[facility]  [address] [place] [address] [phone]  ?[facility]  [address] [place] [address] [phone]  ?[facility]  [address] [place] [address] [phone]  ?[facility]  [address] [place], [place] [address] [phone]  ?[facility]  [address] [place], [place] [address] [phone]  ?[facility]  [address] [place], [place] [address] [phone]  ?[facility]  [address], [place] [address] [phone]  ?[facility]  [address], [place] [address] [phone]  ?[facility]  [address] [place] [address] [phone]  ?[facility]  [address] [place] [address] [phone]  ?[facility]  [address], [place] [address] [phone]  ?[facility]  [address], [place] [address] [phone]  ?[facility]  [address], [place] [address] [phone]  ?[facility]  [address], [place] [address] [phone]  ?[facility]  [address] [place], [place] [address] [phone]  ?[facility]  [address] [place], [place] [address] [phone]  ?[facility]  [address] [place]  [place] [address] [phone]  ?[facility]  [address] [place] [address] [phone]  ?[facility]  [address] [place] [address] [phone]  ?[facility]  [address] [place] [address] [phone]  ?[facility]  [address] [place] [address] [phone]  ?[facility]  [address], [place] [address] [phone]  ?[facility]  [address], [place] [address] [phone]  ?Damita Dunnings, MSW, LCSW-A  ?1:57 PM 03/26/2021   ?

## 2021-03-26 NOTE — Progress Notes (Signed)
CSW spoke with Annette Stable the Intake RN at [facility] [phone], who confirmed there is bed availability. Bill agreed to review the patient to determine acceptance. 2nd shift CSW to follow up regarding eligibility.  ? ? ?Damita Dunnings, MSW, LCSW-A  ?2:06 PM 03/26/2021   ?

## 2021-03-26 NOTE — Progress Notes (Signed)
CSW called [phone] [facility] and spoke with Shanda Bumps who shared to call back tomorrow during first shift for admission. CSW will assist and follow. ? ?Maryjean Ka, MSW, LCSWA ?03/26/2021 11:53 PM ? ? ?

## 2021-03-27 ENCOUNTER — Encounter (HOSPITAL_COMMUNITY): Payer: Self-pay | Admitting: Emergency Medicine

## 2021-03-27 LAB — RESP PANEL BY RT-PCR (FLU A&B, COVID) ARPGX2
Influenza A by PCR: NEGATIVE
Influenza B by PCR: NEGATIVE
SARS Coronavirus 2 by RT PCR: NEGATIVE

## 2021-03-27 MED ORDER — ALBUTEROL SULFATE (2.5 MG/3ML) 0.083% IN NEBU
5.0000 mg | INHALATION_SOLUTION | Freq: Once | RESPIRATORY_TRACT | Status: AC
  Administered 2021-03-27: 5 mg via RESPIRATORY_TRACT
  Filled 2021-03-27: qty 6

## 2021-03-27 MED ORDER — IPRATROPIUM BROMIDE 0.02 % IN SOLN
0.5000 mg | Freq: Once | RESPIRATORY_TRACT | Status: AC
  Administered 2021-03-27: 0.5 mg via RESPIRATORY_TRACT
  Filled 2021-03-27: qty 2.5

## 2021-03-27 MED ORDER — MONTELUKAST SODIUM 10 MG PO TABS
10.0000 mg | ORAL_TABLET | Freq: Every day | ORAL | Status: DC
  Administered 2021-03-27: 10 mg via ORAL
  Filled 2021-03-27: qty 1

## 2021-03-27 NOTE — Progress Notes (Signed)
CSW contacted [facility] via phone but there was no answer. CSW was unable to leave a voice message but will try again later. CSW sent a secured chat message to the [facility] informing her that the patient's O2 levels were within normal range and request review for admission. CSW will continue to monitor the Pt until recommended disposition is secured.  ? ? ?Damita Dunnings, MSW, LCSW-A  ?10:42 AM 03/27/2021   ?

## 2021-03-27 NOTE — ED Notes (Signed)
Supplemental O2 removed, patient in no apparent distress with SpO2 of 95% on room air. ?

## 2021-03-27 NOTE — Progress Notes (Signed)
Patient has been denied by [facility] due to no appropriate beds available. Patient meets [facility] inpatient criteria per Dr. Lucianne Muss. Patient has been faxed out to the following facilities:  ? ? ?[facility]  [address] [place] [address] [phone]  ?[facility]  [address], [place] [address] [phone]  ?[facility]  [address] [place] [address] [phone]  ?[facility]  [address] [place] [address] [phone]  ?[facility]  [address] [place], [place] [address] [phone]  ?[facility]  [address] [place], [place] [address] [phone]  ?[facility]  [address] [place], [place] [address] [phone]  ?[facility]  [address] [place] [address] [phone]  ?[facility]  [address], [place] [address] [phone]  ?[facility]  [address] [place]., [place] [address] [phone]  ?[facility]  [address], [place] [address] [phone]  ?[facility]  [address] [place] [address] [phone]  ?[facility]  [address], [place] [address] [phone]  ?[facility]  [address] [place] [address] [phone]  ?[facility]  [address], [place] [address] [phone]  ?[facility]  [address] [place], [place] [address] [phone]  ?[facility]  [address] [place], [place] [address] [phone]  ?[facility]  [address] [place] [address] [phone]  ?[facility]  [address] [place] [address] [phone]  ?[facility]  [address], [place] [address] [phone]  ?[facility]  [address], [place] [address] [phone]  ?[facility]  [address] [place] [address] [phone]  ?[facility]  [address] [place] [address] [phone]  ?[facility]  [address], [place] [address] [phone]  ? ?Damita Dunnings, MSW, LCSW-A  ?1:55 PM 03/27/2021   ?

## 2021-03-27 NOTE — ED Notes (Signed)
This RN to fax signed Voluntary consent form for admission form to [facility] per request   ?

## 2021-03-27 NOTE — ED Notes (Signed)
Pt at nurse's station using phone

## 2021-03-27 NOTE — Progress Notes (Addendum)
Pt will now admit to [facility] 03/28/2021 ? ?Per Jasmin Faulcon, LCAS pt is unable to admit this evening to [facility] due to staffing. Nursing notified: Vivi Ferns, RN. Loura Halt, RN advised to follow up with first shift in regards to pt admitting tomorrow. CSW and disposition team will assist and follow.  ? ? ?Maryjean Ka, MSW, LCSWA ?03/27/2021 7:59 PM ? ? ?

## 2021-03-27 NOTE — Progress Notes (Signed)
This pt has been accepted to [facility] 03/27/21 ? ?Pt was accept [place]; 301, by [place] Charge Nurse Skylee C.   ? ?Pt meets inpatient criteria per Dr. Dwyane Dee ? ?Attending Physician will be Dr. Weber Cooks ? ?Report can be called to: [phone] ? ?Representative/Transfer Coordinator is Ian Malkin ?Patient pre-admitted by [facility] Patient Access Seth Bake). ? ? ?Benjaman Kindler, MSW, LCSWA ?03/27/2021 7:02 PM ?  ?

## 2021-03-27 NOTE — ED Notes (Signed)
Pt remains on room air, o2 sats 95%, will continue to monitor.  ?

## 2021-03-27 NOTE — BH Assessment (Signed)
Patient has been accepted to [facility].  ?Accepting physician is Dr. Weber Cooks.  ?Attending  Physician will be Dr. Weber Cooks.  ?Patient has been assigned to room 301, by [place] Charge Nurse [place] C.   ?Call report to [phone].  ?Representative/Transfer Coordinator is Kerry Dory ?Patient pre-admitted by [facility] Patient Access Seth Bake). ?

## 2021-03-27 NOTE — ED Provider Notes (Addendum)
Emergency Medicine Observation Re-evaluation Note ? ?Rachel Nolan is a 51 y.o. female, seen on rounds today.  Pt initially presented to the ED for complaints of  psychiatric evaluation. Currently no new c/o, breathing comfortably. ? ?Physical Exam  ?BP (!) 147/78 (BP Location: Right Arm)   Pulse 69   Temp 97.9 ?F (36.6 ?C) (Oral)   Resp 18   Ht 1.676 m (5\' 6" )   Wt 86.2 kg   LMP 06/19/2018 (Exact Date)   SpO2 96%   BMI 30.67 kg/m?  ?Physical Exam ?General: alert, content.  ?Cardiac: regular rate.  ?Lungs: breathing comfortably. ?Psych: alert, content. Does not appear to be responding to internal stimuli - no delusions/hallucinations are noted. Normal mood and affect. Does not appear acutely depressed. No thoughts of harm to self or others.  ? ?ED Course / MDM  ? ? ?I have reviewed the labs performed to date as well as medications administered while in observation.  Recent changes in the last 24 hours include ED obs, reassessment.  ? ?Plan  ? ? Harveen Flesch is not under involuntary commitment. ? ?Pt with normal cxr, and o2 sats good. Will try off o2.  ? ?BH placement and reassessment has been pending.  ? ? ?  ?[name], MD ?03/27/21 0901 ? ?Patients o2 sats remain good off any supplemental o2, 95-96%.   BH team/SW informed that pt does not need continuous supplemental o2.  ? ? ? ?  ?05/27/21, MD ?03/27/21 1342 ? ?

## 2021-03-27 NOTE — ED Notes (Signed)
This RN called [facility] to give nursing report on pt accepted to facility, was informed by charge RN [place] unable to accept pt at this time d/t critical staffing. Advised to hold pt in current bed in ED till staffing is secured, [facility] RN provided with this RN contact number to call when ready to receive patient and for nursing report.  ?

## 2021-03-28 ENCOUNTER — Inpatient Hospital Stay
Admission: AD | Admit: 2021-03-28 | Discharge: 2021-04-08 | DRG: 885 | Disposition: A | Discharge status: Home / Self Care | Payer: 59 | Source: Intra-hospital | Attending: Psychiatry | Admitting: Psychiatry

## 2021-03-28 ENCOUNTER — Other Ambulatory Visit: Payer: Self-pay

## 2021-03-28 ENCOUNTER — Encounter: Payer: Self-pay | Admitting: Psychiatry

## 2021-03-28 DIAGNOSIS — Z5900 Homelessness unspecified: Secondary | ICD-10-CM | POA: Diagnosis not present

## 2021-03-28 DIAGNOSIS — I10 Essential (primary) hypertension: Secondary | ICD-10-CM | POA: Diagnosis present

## 2021-03-28 DIAGNOSIS — Z79899 Other long term (current) drug therapy: Secondary | ICD-10-CM | POA: Diagnosis not present

## 2021-03-28 DIAGNOSIS — F17210 Nicotine dependence, cigarettes, uncomplicated: Secondary | ICD-10-CM | POA: Diagnosis present

## 2021-03-28 DIAGNOSIS — F1410 Cocaine abuse, uncomplicated: Secondary | ICD-10-CM | POA: Diagnosis present

## 2021-03-28 DIAGNOSIS — Z9981 Dependence on supplemental oxygen: Secondary | ICD-10-CM

## 2021-03-28 DIAGNOSIS — F32A Depression, unspecified: Secondary | ICD-10-CM | POA: Diagnosis present

## 2021-03-28 DIAGNOSIS — F419 Anxiety disorder, unspecified: Secondary | ICD-10-CM | POA: Diagnosis present

## 2021-03-28 DIAGNOSIS — J45909 Unspecified asthma, uncomplicated: Secondary | ICD-10-CM | POA: Diagnosis present

## 2021-03-28 DIAGNOSIS — Z20822 Contact with and (suspected) exposure to covid-19: Secondary | ICD-10-CM | POA: Diagnosis present

## 2021-03-28 DIAGNOSIS — M549 Dorsalgia, unspecified: Secondary | ICD-10-CM | POA: Diagnosis not present

## 2021-03-28 DIAGNOSIS — J449 Chronic obstructive pulmonary disease, unspecified: Secondary | ICD-10-CM | POA: Diagnosis present

## 2021-03-28 DIAGNOSIS — K0889 Other specified disorders of teeth and supporting structures: Secondary | ICD-10-CM | POA: Diagnosis not present

## 2021-03-28 DIAGNOSIS — F339 Major depressive disorder, recurrent, unspecified: Secondary | ICD-10-CM | POA: Diagnosis present

## 2021-03-28 DIAGNOSIS — F332 Major depressive disorder, recurrent severe without psychotic features: Secondary | ICD-10-CM | POA: Diagnosis not present

## 2021-03-28 DIAGNOSIS — G4700 Insomnia, unspecified: Secondary | ICD-10-CM | POA: Diagnosis present

## 2021-03-28 DIAGNOSIS — Z8249 Family history of ischemic heart disease and other diseases of the circulatory system: Secondary | ICD-10-CM | POA: Diagnosis not present

## 2021-03-28 DIAGNOSIS — R45851 Suicidal ideations: Secondary | ICD-10-CM | POA: Diagnosis present

## 2021-03-28 DIAGNOSIS — Z23 Encounter for immunization: Secondary | ICD-10-CM | POA: Diagnosis not present

## 2021-03-28 DIAGNOSIS — F4310 Post-traumatic stress disorder, unspecified: Secondary | ICD-10-CM | POA: Diagnosis present

## 2021-03-28 HISTORY — DX: Chronic obstructive pulmonary disease, unspecified: J44.9

## 2021-03-28 LAB — LIPID PANEL
Cholesterol: 149 mg/dL (ref 0–200)
HDL: 52 mg/dL (ref >40)
LDL Cholesterol: 57 mg/dL (ref 0–99)
Total CHOL/HDL Ratio: 2.9 RATIO
Triglycerides: 200 mg/dL — ABNORMAL HIGH (ref <150)
VLDL: 40 mg/dL (ref 0–40)

## 2021-03-28 MED ORDER — GABAPENTIN 100 MG PO CAPS
200.0000 mg | ORAL_CAPSULE | Freq: Three times a day (TID) | ORAL | Status: DC
  Administered 2021-03-28 – 2021-04-08 (×32): 200 mg via ORAL
  Filled 2021-03-28 (×32): qty 2

## 2021-03-28 MED ORDER — MAGNESIUM HYDROXIDE 400 MG/5ML PO SUSP
30.0000 mL | Freq: Every day | ORAL | Status: DC | PRN
  Administered 2021-04-05: 30 mL via ORAL
  Filled 2021-03-28: qty 30

## 2021-03-28 MED ORDER — ALUM & MAG HYDROXIDE-SIMETH 200-200-20 MG/5ML PO SUSP: 30.0000 mL | ORAL | Status: DC | PRN

## 2021-03-28 MED ORDER — INFLUENZA VAC SPLIT QUAD 0.5 ML IM SUSY
0.5000 mL | PREFILLED_SYRINGE | INTRAMUSCULAR | Status: AC
  Administered 2021-03-29: 0.5 mL via INTRAMUSCULAR
  Filled 2021-03-28: qty 0.5

## 2021-03-28 MED ORDER — QUETIAPINE FUMARATE 100 MG PO TABS
100.0000 mg | ORAL_TABLET | Freq: Every day | ORAL | Status: DC
  Administered 2021-03-28 – 2021-04-07 (×11): 100 mg via ORAL
  Filled 2021-03-28 (×11): qty 1

## 2021-03-28 MED ORDER — TRAZODONE HCL 100 MG PO TABS
100.0000 mg | ORAL_TABLET | Freq: Every evening | ORAL | Status: DC | PRN
  Administered 2021-03-28 – 2021-03-30 (×3): 100 mg via ORAL
  Filled 2021-03-28 (×3): qty 1

## 2021-03-28 MED ORDER — FLUOXETINE HCL 20 MG PO CAPS
40.0000 mg | ORAL_CAPSULE | Freq: Every morning | ORAL | Status: DC
  Administered 2021-03-29 – 2021-04-08 (×10): 40 mg via ORAL
  Filled 2021-03-28 (×10): qty 2

## 2021-03-28 MED ORDER — ACETAMINOPHEN 325 MG PO TABS
650.0000 mg | ORAL_TABLET | Freq: Four times a day (QID) | ORAL | Status: DC | PRN
  Administered 2021-03-29 – 2021-04-04 (×7): 650 mg via ORAL
  Filled 2021-03-28 (×7): qty 2

## 2021-03-28 MED ORDER — NICOTINE 21 MG/24HR TD PT24
21.0000 mg | MEDICATED_PATCH | Freq: Every day | TRANSDERMAL | Status: DC
  Administered 2021-03-29 – 2021-04-08 (×6): 21 mg via TRANSDERMAL
  Filled 2021-03-28 (×11): qty 1

## 2021-03-28 MED ORDER — ACETAMINOPHEN 325 MG PO TABS: 650.0000 mg | ORAL_TABLET | Freq: Four times a day (QID) | ORAL | Status: DC | PRN

## 2021-03-28 MED ORDER — MONTELUKAST SODIUM 10 MG PO TABS
10.0000 mg | ORAL_TABLET | Freq: Every day | ORAL | Status: DC
  Administered 2021-03-28 – 2021-04-07 (×11): 10 mg via ORAL
  Filled 2021-03-28 (×13): qty 1

## 2021-03-28 MED ORDER — PENICILLIN V POTASSIUM 500 MG PO TABS
500.0000 mg | ORAL_TABLET | Freq: Three times a day (TID) | ORAL | Status: AC
  Administered 2021-03-28 – 2021-03-30 (×8): 500 mg via ORAL
  Filled 2021-03-28 (×8): qty 1

## 2021-03-28 MED ORDER — PRAZOSIN HCL 1 MG PO CAPS
1.0000 mg | ORAL_CAPSULE | Freq: Every day | ORAL | Status: DC
  Administered 2021-03-28 – 2021-04-07 (×12): 1 mg via ORAL
  Filled 2021-03-28 (×12): qty 1

## 2021-03-28 MED ORDER — HYDROXYZINE HCL 50 MG PO TABS
50.0000 mg | ORAL_TABLET | Freq: Three times a day (TID) | ORAL | Status: DC | PRN
Start: 2021-03-28 — End: 2021-04-08
  Administered 2021-03-29 – 2021-03-31 (×3): 50 mg via ORAL
  Filled 2021-03-28 (×3): qty 1

## 2021-03-28 MED ORDER — IPRATROPIUM-ALBUTEROL 0.5-2.5 (3) MG/3ML IN SOLN
3.0000 mL | RESPIRATORY_TRACT | Status: DC | PRN
  Administered 2021-03-29: 3 mL via RESPIRATORY_TRACT
  Filled 2021-03-28 (×2): qty 3

## 2021-03-28 MED ORDER — PRAZOSIN HCL 2 MG PO CAPS: 3.0000 mg | ORAL_CAPSULE | Freq: Every day | ORAL | Status: DC

## 2021-03-28 NOTE — ED Notes (Signed)
All belongings returned to Pt and one belongings bag given to [employer].  Original Voluntary form and signed EMTALA document sent w/ Pt.  ?

## 2021-03-28 NOTE — ED Provider Notes (Signed)
Emergency Medicine Observation Re-evaluation Note ? ?Rachel Nolan is a 51 y.o. female, seen on rounds today.  Pt initially presented to the ED for complaints of SI/history of asthma ?Currently, the patient is doing well. ? ?Physical Exam  ?BP (!) 148/96   Pulse 85   Temp 98.6 ?F (37 ?C)   Resp 19   Ht 5\' 6"  (1.676 m)   Wt 86.2 kg   LMP 06/19/2018 (Exact Date)   SpO2 96%   BMI 30.67 kg/m?  ?Physical Exam ?General: awake and alert ?Cardiac: rrr ?Lungs: some wheezing, but she feels her breathing is normal ?Psych: calm ? ?ED Course / MDM  ?EKG:EKG Interpretation ? ?Date/Time:  Monday March 23 2021 13:29:11 EST ?Ventricular Rate:  84 ?PR Interval:  154 ?QRS Duration: 84 ?QT Interval:  396 ?QTC Calculation: 467 ?R Axis:   29 ?Text Interpretation: Sinus rhythm with marked sinus arrhythmia Minimal voltage criteria for LVH, may be normal variant ( Cornell product ) Possible Anterior infarct , age undetermined Abnormal ECG When compared with ECG of 23-Feb-2021 11:58, PREVIOUS ECG IS PRESENT Confirmed by Merrily Pew [phone]) on 03/25/2021 5:54:08 AM ? ?I have reviewed the labs performed to date as well as medications administered while in observation.  Recent changes in the last 24 hours include acceptance to .. ? ?Plan  ?Current plan is for transfer to [place].  She is stable for transport. ? Rachel Nolan is not under involuntary commitment. ? ? ?  ?Rachel Pence, MD ?03/28/21 1051 ? ?

## 2021-03-28 NOTE — Tx Team (Signed)
Initial Treatment Plan ?03/28/2021 ?2:40 PM ?[name] ?[id number] ? ? ? ?PATIENT STRESSORS: ?Financial difficulties   ?Loss of nephew (2 years ago)   ?Substance abuse   ?Other: Homelessness   ? ? ?PATIENT STRENGTHS: ?Ability for insight  ?Motivation for treatment/growth  ?Supportive family/friends  ? ? ?PATIENT IDENTIFIED PROBLEMS: ?Altered living arrangements  ?Ineffective coping with past trauma  ?  ?  ?  ?  ?  ?  ?  ?  ? ?DISCHARGE CRITERIA:  ?Ability to meet basic life and health needs ?Adequate post-discharge living arrangements ? ?PRELIMINARY DISCHARGE PLAN: ?Placement in alternative living arrangements ? ?PATIENT/FAMILY INVOLVEMENT: ?This treatment plan has been presented to and reviewed with the patient, Fatimah Sundquist family member.  The patient have been given the opportunity to ask questions and make suggestions. ? ?Angeline Slim, RN ?03/28/2021, 2:40 PM ?

## 2021-03-28 NOTE — ED Notes (Signed)
[facility] called for transport.  ?

## 2021-03-28 NOTE — Progress Notes (Signed)
?  Chaplain On-Call responded to Spiritual Care Consult Order from [facility] for Advance Directives information for the patient. ? ?Chaplain provided the documents and education for the patient. ? ?Chaplain also offered spiritual and emotional support as the patient described her current life situation of being homeless, and seeking to recover from chemical use. ? ?Chaplain prayed with the patient for hope; strength; and courage. ? ?Chaplain Pollyann Samples ?M.Div., [facility] ?

## 2021-03-28 NOTE — Progress Notes (Signed)
Admission Note: ? ?Patient is a 51 y/o female who was admitted voluntarily for depression. Patient was transferred from [facility] ED via safe transport. Patient is calm and cooperative during assessment. Encores SI put denies having a plan and verbally commits to remaining safe on the unit. Denies HI/AVH.  ? ?Patient walks slowly and gets short of breath after walking a short distance. Audibile wheezing heard without stereoscope. Patient reports having COPD and using 02 @ 2L during the day and CPAP at night. Per patient, "I never leave the house without my oxygen since they put me on it. " Patient also admits to having difficulty with ADLs and does her best when her sister is not around.  ? ?Patient informs RN that she is homeless and sleeping in different cars. She states, "I can stay at my sister's house. But she uses Heroin and I stopped that 1 yr ago. I don't want to be tempted to use that again because that stuff is hard." Patient also discussed having a difficult time coping with her nephews death that she witnessed in front of her. States, "I still hear his voice. The last thing he said to me was,"Auntie, don't move. I still hear those words." ? ?Admission plan of care reviewed with pt, consent signed.  Personal belongings/skin assessment completed.   No contraband found.  Patient oriented to the unit, staff and room.  Routine safety checks initiated.  ? ? Verbalizes understanding of unit rules/protocols.   Patient is presently safe on the unit. No unsafe behaviors noted.  Q 15 minute safety checks maintained per unit protocol.   ?

## 2021-03-28 NOTE — Care Management (Signed)
Called to [facility]  behavioral unit to establish admission. They have already received report and the patient will be sent via safe transport. ?

## 2021-03-28 NOTE — H&P (Signed)
Psychiatric Admission Assessment Adult  Patient Identification: Rachel Nolan MRN:  [id number] Date of Evaluation:  03/28/2021 Chief Complaint:  Depression [F32.A] Principal Diagnosis: Depression Diagnosis:  Principal Problem:   Depression  History of Present Illness: Patient seen and chart reviewed.  51 year old woman with a history of polysubstance abuse who presented to the emergency room at [facility] several days ago complaining of medical issues but also reporting depression and suicidal ideation.  COPD attack has improved but the patient continues to endorse depression and was thought to need hospitalization.  On an my evaluation the patient says that she has been feeling depressed recently.  She blames this on multiple life stressors including having witnessed the death of her nephew.  Admits that she has been back to using cocaine regularly and also that she is homeless and has been off of her medicine.  Patient says today she is still feeling depressed and has suicidal thoughts but has no intent or plan to act on it.  She is not sleeping well at night.  Breathing has cleared up. Associated Signs/Symptoms: Depression Symptoms:  depressed mood, insomnia, fatigue, suicidal thoughts without plan, Duration of Depression Symptoms: Greater than two weeks  (Hypo) Manic Symptoms:  Impulsivity, Anxiety Symptoms:  Excessive Worry, Psychotic Symptoms:   Denies any currently PTSD Symptoms: History of multiple traumatic incidents including witnessing the death of relatives.  Chronic hypervigilance Total Time spent with patient: 1 hour  Past Psychiatric History: Past history of several admissions to the hospital pretty much all under identical circumstances.  Drug abuse without being involved in active outpatient treatment and homelessness.  Is the patient at risk to self? Yes.    Has the patient been a risk to self in the past 6 months? Yes.    Has the patient been a risk to self within the  distant past? Yes.    Is the patient a risk to others? No.  Has the patient been a risk to others in the past 6 months? No.  Has the patient been a risk to others within the distant past? No.   Prior Inpatient Therapy:   Prior Outpatient Therapy:    Alcohol Screening: 1. How often do you have a drink containing alcohol?: Monthly or less 2. How many drinks containing alcohol do you have on a typical day when you are drinking?: 1 or 2 3. How often do you have six or more drinks on one occasion?: Never AUDIT-C Score: 1 4. How often during the last year have you found that you were not able to stop drinking once you had started?: Never 5. How often during the last year have you failed to do what was normally expected from you because of drinking?: Never 6. How often during the last year have you needed a first drink in the morning to get yourself going after a heavy drinking session?: Never 7. How often during the last year have you had a feeling of guilt of remorse after drinking?: Never 8. How often during the last year have you been unable to remember what happened the night before because you had been drinking?: Never 9. Have you or someone else been injured as a result of your drinking?: No 10. Has a relative or friend or a doctor or another health worker been concerned about your drinking or suggested you cut down?: No Alcohol Use Disorder Identification Test Final Score (AUDIT): 1 Substance Abuse History in the last 12 months:  Yes.   Consequences of Substance  Abuse: Cocaine abuse is a major factor in ongoing symptoms Previous Psychotropic Medications: Yes  Psychological Evaluations: Yes  Past Medical History:  Past Medical History:  Diagnosis Date   Anxiety    Asthma    COPD (chronic obstructive pulmonary disease) ([place])    Depression    Hypertension    Scoliosis     Past Surgical History:  Procedure Laterality Date   BACK SURGERY     ECTOPIC PREGNANCY SURGERY     Family  History:  Family History  Problem Relation Age of Onset   Hypertension Mother    Hypertension Father    Family Psychiatric  History: See previous Tobacco Screening:   Social History:  Social History   Substance and Sexual Activity  Alcohol Use Yes   Comment: one 40 oz per month     Social History   Substance and Sexual Activity  Drug Use Yes   Types: Marijuana, Cocaine, "Crack" cocaine, Heroin    Additional Social History:                           Allergies:  No Known Allergies Lab Results:  Results for orders placed or performed during the hospital encounter of 03/23/21 (from the past 48 hour(s))  Resp Panel by RT-PCR (Flu A&B, Covid) Nasopharyngeal Swab     Status: None   Collection Time: 03/27/21  2:50 PM   Specimen: Nasopharyngeal Swab; Nasopharyngeal(NP) swabs in vial transport medium  Result Value Ref Range   SARS Coronavirus 2 by RT PCR NEGATIVE NEGATIVE    Comment: (NOTE) SARS-CoV-2 target nucleic acids are NOT DETECTED.  The SARS-CoV-2 RNA is generally detectable in upper respiratory specimens during the acute phase of infection. The lowest concentration of SARS-CoV-2 viral copies this assay can detect is 138 copies/mL. A negative result does not preclude SARS-Cov-2 infection and should not be used as the sole basis for treatment or other patient management decisions. A negative result may occur with  improper specimen collection/handling, submission of specimen other than nasopharyngeal swab, presence of viral mutation(s) within the areas targeted by this assay, and inadequate number of viral copies(<138 copies/mL). A negative result must be combined with clinical observations, patient history, and epidemiological information. The expected result is Negative.  Fact Sheet for Patients:  EntrepreneurPulse.com.au  Fact Sheet for Healthcare Providers:  IncredibleEmployment.be  This test is no t yet approved or  cleared by the [place] FDA and  has been authorized for detection and/or diagnosis of SARS-CoV-2 by FDA under an Emergency Use Authorization (EUA). This EUA will remain  in effect (meaning this test can be used) for the duration of the COVID-19 declaration under Section 564(b)(1) of the Act, 21 U.S.C.section 360bbb-3(b)(1), unless the authorization is terminated  or revoked sooner.       Influenza A by PCR NEGATIVE NEGATIVE   Influenza B by PCR NEGATIVE NEGATIVE    Comment: (NOTE) The Xpert Xpress SARS-CoV-2/FLU/RSV plus assay is intended as an aid in the diagnosis of influenza from Nasopharyngeal swab specimens and should not be used as a sole basis for treatment. Nasal washings and aspirates are unacceptable for Xpert Xpress SARS-CoV-2/FLU/RSV testing.  Fact Sheet for Patients: EntrepreneurPulse.com.au  Fact Sheet for Healthcare Providers: IncredibleEmployment.be  This test is not yet approved or cleared by the [place] FDA and has been authorized for detection and/or diagnosis of SARS-CoV-2 by FDA under an Emergency Use Authorization (EUA). This EUA will remain in effect (meaning this  test can be used) for the duration of the COVID-19 declaration under Section 564(b)(1) of the Act, 21 U.S.C. section 360bbb-3(b)(1), unless the authorization is terminated or revoked.  Performed at [facility], [place] [address] [place], [place] [address]     Blood Alcohol level:  Lab Results  Component Value Date   ETH <10 03/23/2021   ETH <10 [id number]    Metabolic Disorder Labs:  Lab Results  Component Value Date   HGBA1C 5.5 09/21/2020   MPG 111 09/21/2020   MPG 111.15 07/09/2020   No results found for: PROLACTIN Lab Results  Component Value Date   CHOL 145 09/21/2020   TRIG 109 09/21/2020   HDL 67 09/21/2020   CHOLHDL 2.2 09/21/2020   VLDL 22 09/21/2020   LDLCALC 56 09/21/2020   LDLCALC 60 07/09/2020    Current  Medications: Current Facility-Administered Medications  Medication Dose Route Frequency Provider Last Rate Last Admin   acetaminophen (TYLENOL) tablet 650 mg  650 mg Oral Q6H PRN Burch Marchuk, Madie Reno, MD       alum & mag hydroxide-simeth (MAALOX/MYLANTA) 200-200-20 MG/5ML suspension 30 mL  30 mL Oral Q4H PRN Baileigh Modisette, Madie Reno, MD       Derrill Memo ON 03/29/2021] FLUoxetine (PROZAC) capsule 40 mg  40 mg Oral q morning Kynzleigh Bandel T, MD       gabapentin (NEURONTIN) capsule 200 mg  200 mg Oral TID Zakye Baby, Madie Reno, MD       hydrOXYzine (ATARAX) tablet 50 mg  50 mg Oral TID PRN Wilburn Keir, Madie Reno, MD       [START ON 03/29/2021] influenza vac split quadrivalent PF (FLUARIX) injection 0.5 mL  0.5 mL Intramuscular Tomorrow-1000 Idil Maslanka T, MD       ipratropium-albuterol (DUONEB) 0.5-2.5 (3) MG/3ML nebulizer solution 3 mL  3 mL Nebulization Q4H PRN Taneesha Edgin T, MD       magnesium hydroxide (MILK OF MAGNESIA) suspension 30 mL  30 mL Oral Daily PRN Khalilah Hoke, Madie Reno, MD       montelukast (SINGULAIR) tablet 10 mg  10 mg Oral QHS Lenwood Balsam T, MD       nicotine (NICODERM CQ - dosed in mg/24 hours) patch 21 mg  21 mg Transdermal Daily Ercel Pepitone T, MD       penicillin v potassium (VEETID) tablet 500 mg  500 mg Oral Q8H Keyuana Wank T, MD       prazosin (MINIPRESS) capsule 1 mg  1 mg Oral QHS Estevon Fluke T, MD       QUEtiapine (SEROQUEL) tablet 100 mg  100 mg Oral QHS Aralyn Nowak T, MD       traZODone (DESYREL) tablet 100 mg  100 mg Oral QHS PRN Bryttney Netzer, Madie Reno, MD       PTA Medications: Medications Prior to Admission  Medication Sig Dispense Refill Last Dose   albuterol (PROVENTIL) (2.5 MG/3ML) 0.083% nebulizer solution Take 3 mLs (2.5 mg total) by nebulization every 4 (four) hours as needed for wheezing or shortness of breath. 75 mL 0    albuterol (VENTOLIN HFA) 108 (90 Base) MCG/ACT inhaler Inhale 2 puffs into the lungs every 6 (six) hours as needed. (Patient taking differently: Inhale 2 puffs into the  lungs every 6 (six) hours as needed for shortness of breath or wheezing.) 8.5 g 0    amLODipine (NORVASC) 10 MG tablet Take 1 tablet (10 mg total) by mouth daily. 30 tablet 0    doxycycline (VIBRA-TABS) 100 MG  tablet Take 1 tablet (100 mg total) by mouth every 12 (twelve) hours. (Patient not taking: Reported on 03/24/2021) 10 tablet 0    FLUoxetine (PROZAC) 40 MG capsule Take 1 capsule (40 mg total) by mouth every morning. 30 capsule 0    folic acid (FOLVITE) 1 MG tablet Take 1 tablet (1 mg total) by mouth daily.      ibuprofen (ADVIL) 200 MG tablet Take 200 mg by mouth every 6 (six) hours as needed for headache (pain).      montelukast (SINGULAIR) 10 MG tablet Take 1 tablet (10 mg total) by mouth at bedtime. 30 tablet 0    nicotine (NICODERM CQ - DOSED IN MG/24 HOURS) 14 mg/24hr patch Place 1 patch (14 mg total) onto the skin daily. (Patient not taking: Reported on 02/23/2021) 28 patch 0    NON FORMULARY CPAP at bedtime      OXYGEN Inhale 2 L into the lungs continuous.      prazosin (MINIPRESS) 1 MG capsule Take 3 capsules (3 mg total) by mouth at bedtime. 90 capsule 0    predniSONE (STERAPRED UNI-PAK 21 TAB) 10 MG (21) TBPK tablet Use per pack instruction (Patient not taking: Reported on 03/24/2021) 21 tablet 0    QUEtiapine (SEROQUEL) 200 MG tablet Take 1 tablet (200 mg total) by mouth at bedtime. 30 tablet 0     Musculoskeletal: Strength & Muscle Tone: within normal limits Gait & Station: normal Patient leans: N/A            Psychiatric Specialty Exam:  Presentation  General Appearance: Appropriate for Environment; Fairly Groomed  Eye Contact:Fair  Speech:Clear and Coherent  Speech Volume:Normal  Handedness:Right   Mood and Affect  Mood:Hopeless; Dysphoric  Affect:Congruent; Depressed   Thought Process  Thought Processes:Coherent  Duration of Psychotic Symptoms: N/A  Past Diagnosis of Schizophrenia or Psychoactive disorder: No  Descriptions of  Associations:Intact  Orientation:Full (Time, Place and Person)  Thought Content:Rumination  Hallucinations:No data recorded Ideas of Reference:None  Suicidal Thoughts:No data recorded Homicidal Thoughts:No data recorded  Sensorium  Memory:Immediate Good; Recent Good; Remote Good  Judgment:Impaired  Insight:Lacking   Executive Functions  Concentration:Fair  Attention Span:Fair  [place]  Language:Good   Psychomotor Activity  Psychomotor Activity:No data recorded  Assets  Assets:Communication Skills; Desire for Improvement   Sleep  Sleep:No data recorded   Physical Exam: Physical Exam Vitals and nursing note reviewed.  Constitutional:      Appearance: Normal appearance.  HENT:     Head: Normocephalic and atraumatic.     Mouth/Throat:     Pharynx: Oropharynx is clear.  Eyes:     Pupils: Pupils are equal, round, and reactive to light.  Cardiovascular:     Rate and Rhythm: Normal rate and regular rhythm.  Pulmonary:     Effort: Pulmonary effort is normal.     Breath sounds: Normal breath sounds.  Abdominal:     General: Abdomen is flat.     Palpations: Abdomen is soft.  Musculoskeletal:        General: Normal range of motion.  Skin:    General: Skin is warm and dry.  Neurological:     General: No focal deficit present.     Mental Status: She is alert. Mental status is at baseline.  Psychiatric:        Attention and Perception: Attention normal.        Mood and Affect: Mood is depressed.        Speech:  Speech normal.        Behavior: Behavior normal.        Thought Content: Thought content includes suicidal ideation. Thought content does not include suicidal plan.        Cognition and Memory: Cognition normal.        Judgment: Judgment is impulsive.   Review of Systems  Constitutional: Negative.   HENT: Negative.    Eyes: Negative.   Respiratory: Negative.    Cardiovascular: Negative.   Gastrointestinal:  Negative.   Musculoskeletal: Negative.   Skin: Negative.   Neurological: Negative.   Psychiatric/Behavioral:  Positive for depression, substance abuse and suicidal ideas. The patient has insomnia.   Blood pressure (!) 134/99, pulse (!) 101, temperature 98.4 F (36.9 C), temperature source Oral, resp. rate (!) 24, height 5\' 6"  (1.676 m), weight 79.4 kg, last menstrual period 06/19/2018, SpO2 97 %. Body mass index is 28.25 kg/m.  Treatment Plan Summary: Medication management and Plan restart medications as used previously for mood including Prozac low-dose prazosin and Seroquel at night for sleep.  Continue medicines for her asthma COPD and blood pressure.  Nicotine replacement done.  Encourage patient to be out of bed interacting with others attending group.  Full treatment team evaluation will be done and we will start working on appropriate discharge planning  Observation Level/Precautions:  15 minute checks  Laboratory:  Chemistry Profile  Psychotherapy:    Medications:    Consultations:    Discharge Concerns:    Estimated LOS:  Other:     Physician Treatment Plan for Primary Diagnosis: Depression Long Term Goal(s): Improvement in symptoms so as ready for discharge  Short Term Goals: Ability to disclose and discuss suicidal ideas and Ability to demonstrate self-control will improve  Physician Treatment Plan for Secondary Diagnosis: Principal Problem:   Depression  Long Term Goal(s): Improvement in symptoms so as ready for discharge  Short Term Goals: Ability to maintain clinical measurements within normal limits will improve and Ability to identify triggers associated with substance abuse/mental health issues will improve  I certify that inpatient services furnished can reasonably be expected to improve the patient's condition.    Alethia Berthold, MD 3/11/20232:02 PM

## 2021-03-28 NOTE — Group Note (Signed)
LCSW Group Therapy Note ? ?Group Date: 03/28/2021 ?Start Time: 1315 ?End Time: 1415 ? ? ?Type of Therapy and Topic:  Group Therapy - Healthy vs Unhealthy Coping Skills ? ?Participation Level:  Did Not Attend  ? ?Description of Group ?The focus of this group was to determine what unhealthy coping techniques typically are used by group members and what healthy coping techniques would be helpful in coping with various problems. Patients were guided in becoming aware of the differences between healthy and unhealthy coping techniques. Patients were asked to identify 2-3 healthy coping skills they would like to learn to use more effectively. ? ?Therapeutic Goals ?Patients learned that coping is what human beings do all day long to deal with various situations in their lives ?Patients defined and discussed healthy vs unhealthy coping techniques ?Patients identified their preferred coping techniques and identified whether these were healthy or unhealthy ?Patients determined 2-3 healthy coping skills they would like to become more familiar with and use more often. ?Patients provided support and ideas to each other ? ? ?Summary of Patient Progress:  Patient did not attend group despite encouraged participation. ? ? ? ?Therapeutic Modalities ?Cognitive Behavioral Therapy ?Motivational Interviewing ? ?Ellaina Schuler K Lilly Gasser, LCSWA ?03/28/2021  5:35 PM   ?
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
## 2021-03-28 NOTE — Plan of Care (Signed)
?  Problem: Education: ?Goal: Knowledge of [facility] Education information/materials will improve ?Outcome: Not Progressing ?Goal: Emotional status will improve ?Outcome: Not Progressing ?Goal: Mental status will improve ?Outcome: Not Progressing ?Goal: Verbalization of understanding the information provided will improve ?Outcome: Not Progressing ?  ?Problem: Activity: ?Goal: Interest or engagement in activities will improve ?Outcome: Not Progressing ?Goal: Sleeping patterns will improve ?Outcome: Not Progressing ?  ?Problem: Coping: ?Goal: Ability to verbalize frustrations and anger appropriately will improve ?Outcome: Not Progressing ?Goal: Ability to demonstrate self-control will improve ?Outcome: Not Progressing ?  ?Problem: Health Behavior/Discharge Planning: ?Goal: Identification of resources available to assist in meeting health care needs will improve ?Outcome: Not Progressing ?Goal: Compliance with treatment plan for underlying cause of condition will improve ?Outcome: Not Progressing ?  ?Problem: Physical Regulation: ?Goal: Ability to maintain clinical measurements within normal limits will improve ?Outcome: Not Progressing ?  ?Problem: Safety: ?Goal: Periods of time without injury will increase ?Outcome: Not Progressing ?  ?Problem: Education: ?Goal: Utilization of techniques to improve thought processes will improve ?Outcome: Not Progressing ?Goal: Knowledge of the prescribed therapeutic regimen will improve ?Outcome: Not Progressing ?  ?Problem: Activity: ?Goal: Interest or engagement in leisure activities will improve ?Outcome: Not Progressing ?Goal: Imbalance in normal sleep/wake cycle will improve ?Outcome: Not Progressing ?  ?Problem: Coping: ?Goal: Coping ability will improve ?Outcome: Not Progressing ?Goal: Will verbalize feelings ?Outcome: Not Progressing ?  ?Problem: Health Behavior/Discharge Planning: ?Goal: Ability to make decisions will improve ?Outcome: Not Progressing ?Goal:  Compliance with therapeutic regimen will improve ?Outcome: Not Progressing ?  ?Problem: Safety: ?Goal: Ability to disclose and discuss suicidal ideas will improve ?Outcome: Not Progressing ?Goal: Ability to identify and utilize support systems that promote safety will improve ?Outcome: Not Progressing ?  ?Problem: Role Relationship: ?Goal: Will demonstrate positive changes in social behaviors and relationships ?Outcome: Not Progressing ?  ?Problem: Self-Concept: ?Goal: Will verbalize positive feelings about self ?Outcome: Not Progressing ?Goal: Level of anxiety will decrease ?Outcome: Not Progressing ?  ?

## 2021-03-29 DIAGNOSIS — F332 Major depressive disorder, recurrent severe without psychotic features: Secondary | ICD-10-CM | POA: Diagnosis not present

## 2021-03-29 MED ORDER — ALBUTEROL SULFATE (2.5 MG/3ML) 0.083% IN NEBU
2.5000 mg | INHALATION_SOLUTION | RESPIRATORY_TRACT | Status: DC | PRN
Start: 2021-03-29 — End: 2021-04-08
  Filled 2021-03-29: qty 3

## 2021-03-29 MED ORDER — IPRATROPIUM-ALBUTEROL 0.5-2.5 (3) MG/3ML IN SOLN
3.0000 mL | Freq: Three times a day (TID) | RESPIRATORY_TRACT | Status: DC
  Administered 2021-03-29 – 2021-03-30 (×2): 3 mL via RESPIRATORY_TRACT
  Filled 2021-03-29 (×4): qty 3

## 2021-03-29 MED ORDER — ALBUTEROL SULFATE HFA 108 (90 BASE) MCG/ACT IN AERS
2.0000 | INHALATION_SPRAY | RESPIRATORY_TRACT | Status: DC | PRN
Start: 2021-03-29 — End: 2021-04-08
  Administered 2021-04-05: 2 via RESPIRATORY_TRACT
  Filled 2021-03-29: qty 6.7

## 2021-03-29 NOTE — Plan of Care (Signed)
D: Pt alert and oriented. Pt denies experiencing any anxiety/depression at this time. Pt denies experiencing any pain at this time. Pt denies experiencing any SI/HI, or AVH at this time.  ? ?Pt is notably experiencing wheezing and SOB. Pt's wheezing can be heard w/o a stethoscope. Respiratory was contacted to administer a nebulizer treatment which appears to have helped a little bit. Respiratory placed orders for schedule nebulizer treatments to assist patient with breathing. Pt's SOB is experienced with little exertion. Pt also given a walker to assist with walking and balance. Pt spends most of her time in bed with exception to meals.  ? ?A: Scheduled medications administered to pt, per MD orders. Support and encouragement provided. Frequent verbal contact made. Routine safety checks conducted q15 minutes.  ? ?R: No adverse drug reactions noted. Pt verbally contracts for safety at this time. Pt compliant with medications. Pt interacts minimally with others on the unit, mostly self isolating to her room with exception of meals. Pt remains safe at this time. Will continue to monitor.  ? ?Problem: Education: ?Goal: Knowledge of [facility] Education information/materials will improve ?Outcome: Progressing ?  ?Problem: Activity: ?Goal: Interest or engagement in activities will improve ?Outcome: Not Progressing ?  ?

## 2021-03-29 NOTE — Progress Notes (Signed)
Chaplain was in to see patient to talk about advanced directives. She showered. Denies SI, HI and AVH ?

## 2021-03-29 NOTE — [facility] Suicide Risk Assessment (Signed)
[facility] INPATIENT:  Family/Significant Other Suicide Prevention Education ? ?Suicide Prevention Education:  ?Patient Refusal for Family/Significant Other Suicide Prevention Education: The patient Rachel Nolan has refused to provide written consent for family/significant other to be provided Family/Significant Other Suicide Prevention Education during admission and/or prior to discharge.  Physician notified. ? ?SPE completed with pt, as pt refused to consent to family contact. SPI pamphlet provided to pt and pt was encouraged to share information with support network, ask questions, and talk about any concerns relating to SPE. Pt denies access to guns/firearms and verbalized understanding of information provided. Mobile Crisis information also provided to pt.  ? ?Norberto Sorenson ?03/29/2021, 12:02 PM ?

## 2021-03-29 NOTE — Group Note (Signed)
[facility] LCSW Group Therapy Note ? ? ?Group Date: 03/29/2021 ?Start Time: 1315 ?End Time: 1415 ? ? ?Type of Therapy and Topic: Group Therapy: Avoiding Self-Sabotaging and Enabling Behaviors ? ?Participation Level: Did Not Attend ? ?Mood: ? ?Description of Group:  ?In this group, patients will learn how to identify obstacles, self-sabotaging and enabling behaviors, as well as: what are they, why do we do them and what needs these behaviors meet. Discuss unhealthy relationships and how to have positive healthy boundaries with those that sabotage and enable. Explore aspects of self-sabotage and enabling in yourself and how to limit these self-destructive behaviors in everyday life. ? ? ?Therapeutic Goals: ?1. Patient will identify one obstacle that relates to self-sabotage and enabling behaviors ?2. Patient will identify one personal self-sabotaging or enabling behavior they did prior to admission ?3. Patient will state a plan to change the above identified behavior ?4. Patient will demonstrate ability to communicate their needs through discussion and/or role play.  ? ? ?Summary of Patient Progress: Due to limited staffing, group was not held on the unit. ? ? ? ?Therapeutic Modalities:  ?Cognitive Behavioral Therapy ?Person-Centered Therapy ?Motivational Interviewing ? ? ? ?Benjamine Strout K Doni Widmer, LCSWA ?

## 2021-03-29 NOTE — Progress Notes (Signed)
[facility] MD Progress Note  03/29/2021 12:14 PM Rachel Nolan  MRN:  [id number] Subjective: Patient seen.  She is up out of bed today able to walk around the unit but gets short of breath very quickly.  Feels like she needs her nebulizer treatments.  Complains of difficulty sleeping at night and also mentions that she does not have her CPAP machine which she normally uses.  Mood is "okay" but still talks about having passive suicidal thoughts. Principal Problem: Depression Diagnosis: Principal Problem:   Depression Active Problems:   Post traumatic stress disorder (PTSD)   Cocaine use disorder (HCC)   Essential hypertension   Asthma   Nicotine dependence, cigarettes, uncomplicated  Total Time spent with patient: 30 minutes  Past Psychiatric History: Past history of depression substance abuse hospitalizations when in crisis  Past Medical History:  Past Medical History:  Diagnosis Date   Anxiety    Asthma    COPD (chronic obstructive pulmonary disease) (HCC)    Depression    Hypertension    Scoliosis     Past Surgical History:  Procedure Laterality Date   BACK SURGERY     ECTOPIC PREGNANCY SURGERY     Family History:  Family History  Problem Relation Age of Onset   Hypertension Mother    Hypertension Father    Family Psychiatric  History: See previous Social History:  Social History   Substance and Sexual Activity  Alcohol Use Yes   Comment: one 40 oz per month     Social History   Substance and Sexual Activity  Drug Use Yes   Types: Marijuana, Cocaine, "Crack" cocaine, Heroin    Social History   Socioeconomic History   Marital status: Widowed    Spouse name: Not on file   Number of children: Not on file   Years of education: Not on file   Highest education level: Not on file  Occupational History   Not on file  Tobacco Use   Smoking status: Every Day    Packs/day: 1.00    Types: Cigarettes   Smokeless tobacco: Never  Vaping Use   Vaping Use: Never used   Substance and Sexual Activity   Alcohol use: Yes    Comment: one 40 oz per month   Drug use: Yes    Types: Marijuana, Cocaine, "Crack" cocaine, Heroin   Sexual activity: Not Currently  Other Topics Concern   Not on file  Social History Narrative   Pt is homeless, no fixed address; not followed by an outpatient psychiatrist   Social Determinants of Health   Financial Resource Strain: Not on file  Food Insecurity: Not on file  Transportation Needs: Not on file  Physical Activity: Not on file  Stress: Not on file  Social Connections: Not on file   Additional Social History:                         Sleep: Fair  Appetite:  Fair  Current Medications: Current Facility-Administered Medications  Medication Dose Route Frequency Provider Last Rate Last Admin   acetaminophen (TYLENOL) tablet 650 mg  650 mg Oral Q6H PRN Cherylann Hobday T, MD   650 mg at 03/29/21 1040   alum & mag hydroxide-simeth (MAALOX/MYLANTA) 200-200-20 MG/5ML suspension 30 mL  30 mL Oral Q4H PRN Hicks Feick T, MD       FLUoxetine (PROZAC) capsule 40 mg  40 mg Oral q morning Jaelynn Pozo, Jackquline Denmark, MD   40 mg at  03/29/21 1041   gabapentin (NEURONTIN) capsule 200 mg  200 mg Oral TID Davanee Klinkner, Jackquline Denmark, MD   200 mg at 03/29/21 0756   hydrOXYzine (ATARAX) tablet 50 mg  50 mg Oral TID PRN Nadya Hopwood, Jackquline Denmark, MD       influenza vac split quadrivalent PF (FLUARIX) injection 0.5 mL  0.5 mL Intramuscular Tomorrow-1000 [place] Throckmorton T, MD       ipratropium-albuterol (DUONEB) 0.5-2.5 (3) MG/3ML nebulizer solution 3 mL  3 mL Nebulization Q4H PRN Feliciano Wynter T, MD       magnesium hydroxide (MILK OF MAGNESIA) suspension 30 mL  30 mL Oral Daily PRN Shaquanna Lycan, Jackquline Denmark, MD       montelukast (SINGULAIR) tablet 10 mg  10 mg Oral QHS Cavon Nicolls T, MD   10 mg at 03/28/21 2102   nicotine (NICODERM CQ - dosed in mg/24 hours) patch 21 mg  21 mg Transdermal Daily Kanden Carey, Jackquline Denmark, MD   21 mg at 03/29/21 0755   penicillin v potassium (VEETID)  tablet 500 mg  500 mg Oral Q8H Mitul Hallowell T, MD   500 mg at 03/29/21 0558   prazosin (MINIPRESS) capsule 1 mg  1 mg Oral QHS Jakiyah Stepney T, MD   1 mg at 03/28/21 2102   QUEtiapine (SEROQUEL) tablet 100 mg  100 mg Oral QHS Jermichael Belmares T, MD   100 mg at 03/28/21 2103   traZODone (DESYREL) tablet 100 mg  100 mg Oral QHS PRN Artie Takayama, Jackquline Denmark, MD   100 mg at 03/28/21 2102    Lab Results:  Results for orders placed or performed during the hospital encounter of 03/28/21 (from the past 48 hour(s))  Lipid panel     Status: Abnormal   Collection Time: 03/28/21  2:26 PM  Result Value Ref Range   Cholesterol 149 0 - 200 mg/dL   Triglycerides 161 (H) <150 mg/dL   HDL 52 >09 mg/dL   Total CHOL/HDL Ratio 2.9 RATIO   VLDL 40 0 - 40 mg/dL   LDL Cholesterol 57 0 - 99 mg/dL    Comment:        Total Cholesterol/HDL:CHD Risk Coronary Heart Disease Risk Table                     Men   Women  1/2 Average Risk   3.4   3.3  Average Risk       5.0   4.4  2 X Average Risk   9.6   7.1  3 X Average Risk  23.4   11.0        Use the calculated Patient Ratio above and the CHD Risk Table to determine the patient's CHD Risk.        ATP III CLASSIFICATION (LDL):  <100     mg/dL   Optimal  604-540  mg/dL   Near or Above                    Optimal  130-159  mg/dL   Borderline  981-191  mg/dL   High  >478     mg/dL   Very High Performed at [facility], [address] [place], [place] [address]     Blood Alcohol level:  Lab Results  Component Value Date   [facility] <10 03/23/2021   ETH <10 09/21/2020    Metabolic Disorder Labs: Lab Results  Component Value Date   HGBA1C 5.5 09/21/2020   MPG 111  09/21/2020   MPG 111.15 07/09/2020   No results found for: PROLACTIN Lab Results  Component Value Date   CHOL 149 03/28/2021   TRIG 200 (H) 03/28/2021   HDL 52 03/28/2021   CHOLHDL 2.9 03/28/2021   VLDL 40 03/28/2021   LDLCALC 57 03/28/2021   LDLCALC 56 09/21/2020    Physical  Findings: AIMS:  , ,  ,  ,    CIWA:    COWS:     Musculoskeletal: Strength & Muscle Tone: within normal limits Gait & Station: normal Patient leans: N/A  Psychiatric Specialty Exam:  Presentation  General Appearance: Appropriate for Environment; Fairly Groomed  Eye Contact:Fair  Speech:Clear and Coherent  Speech Volume:Normal  Handedness:Right   Mood and Affect  Mood:Hopeless; Dysphoric  Affect:Congruent; Depressed   Thought Process  Thought Processes:Coherent  Descriptions of Associations:Intact  Orientation:Full (Time, Place and Person)  Thought Content:Rumination  History of Schizophrenia/Schizoaffective disorder:No  Duration of Psychotic Symptoms:N/A  Hallucinations:No data recorded Ideas of Reference:None  Suicidal Thoughts:No data recorded Homicidal Thoughts:No data recorded  Sensorium  Memory:Immediate Good; Recent Good; Remote Good  Judgment:Impaired  Insight:Lacking   Executive Functions  Concentration:Fair  Attention Span:Fair  Recall:Good  Fund of Knowledge:Good  Language:Good   Psychomotor Activity  Psychomotor Activity:No data recorded  Assets  Assets:Communication Skills; Desire for Improvement   Sleep  Sleep:No data recorded   Physical Exam: Physical Exam Vitals and nursing note reviewed.  Constitutional:      Appearance: Normal appearance.  HENT:     Head: Normocephalic and atraumatic.     Mouth/Throat:     Pharynx: Oropharynx is clear.  Eyes:     Pupils: Pupils are equal, round, and reactive to light.  Cardiovascular:     Rate and Rhythm: Normal rate and regular rhythm.  Pulmonary:     Effort: Respiratory distress present.     Breath sounds: Wheezing present.     Comments: Patient gets short of breath and is puffing hard even walking 20 feet down the hallway with a walker Abdominal:     General: Abdomen is flat.     Palpations: Abdomen is soft.  Musculoskeletal:        General: Normal range of  motion.  Skin:    General: Skin is warm and dry.  Neurological:     General: No focal deficit present.     Mental Status: She is alert. Mental status is at baseline.  Psychiatric:        Attention and Perception: Attention normal.        Mood and Affect: Mood normal.        Speech: Speech normal.        Behavior: Behavior normal.        Thought Content: Thought content normal.        Cognition and Memory: Cognition normal.        Judgment: Judgment normal.   Review of Systems  Constitutional: Negative.   HENT: Negative.    Eyes: Negative.   Respiratory:  Positive for shortness of breath.   Cardiovascular: Negative.   Gastrointestinal: Negative.   Musculoskeletal: Negative.   Skin: Negative.   Neurological: Negative.   Psychiatric/Behavioral:  Positive for depression and suicidal ideas. The patient is nervous/anxious and has insomnia.   Blood pressure (!) 97/57, pulse 95, temperature 98.5 F (36.9 C), temperature source Oral, resp. rate 18, height 5\' 6"  (1.676 m), weight 79.4 kg, last menstrual period 06/19/2018, SpO2 95 %. Body mass index is 28.25 kg/m.  Treatment Plan Summary: Medication management and Plan continue psychiatric medicine.  I ordered the CPAP from respiratory therapy.  Nursing is going to request respiratory to come do a nebulizer treatment for her as well.  Advised patient that if she is feeling dizzy or weak she should immediately try to sit down and let staff know she needs help.  No change to psychiatric treatment otherwise.  Mordecai Rasmussen, MD 03/29/2021, 12:14 PM
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
## 2021-03-29 NOTE — Plan of Care (Signed)
  Problem: Education: Goal: Knowledge of [facility] Education information/materials will improve Outcome: Progressing Goal: Emotional status will improve Outcome: Progressing Goal: Mental status will improve Outcome: Progressing Goal: Verbalization of understanding the information provided will improve Outcome: Progressing   Problem: Activity: Goal: Interest or engagement in activities will improve Outcome: Progressing Goal: Sleeping patterns will improve Outcome: Progressing   Problem: Coping: Goal: Ability to verbalize frustrations and anger appropriately will improve Outcome: Progressing Goal: Ability to demonstrate self-control will improve Outcome: Progressing   Problem: Health Behavior/Discharge Planning: Goal: Identification of resources available to assist in meeting health care needs will improve Outcome: Progressing Goal: Compliance with treatment plan for underlying cause of condition will improve Outcome: Progressing   Problem: Physical Regulation: Goal: Ability to maintain clinical measurements within normal limits will improve Outcome: Progressing   Problem: Safety: Goal: Periods of time without injury will increase Outcome: Progressing   Problem: Education: Goal: Utilization of techniques to improve thought processes will improve Outcome: Progressing Goal: Knowledge of the prescribed therapeutic regimen will improve Outcome: Progressing   Problem: Activity: Goal: Interest or engagement in leisure activities will improve Outcome: Progressing Goal: Imbalance in normal sleep/wake cycle will improve Outcome: Progressing   Problem: Coping: Goal: Coping ability will improve Outcome: Progressing Goal: Will verbalize feelings Outcome: Progressing   Problem: Health Behavior/Discharge Planning: Goal: Ability to make decisions will improve Outcome: Progressing Goal: Compliance with therapeutic regimen will improve Outcome: Progressing    Problem: Role Relationship: Goal: Will demonstrate positive changes in social behaviors and relationships Outcome: Progressing   Problem: Safety: Goal: Ability to disclose and discuss suicidal ideas will improve Outcome: Progressing Goal: Ability to identify and utilize support systems that promote safety will improve Outcome: Progressing   Problem: Self-Concept: Goal: Will verbalize positive feelings about self Outcome: Progressing Goal: Level of anxiety will decrease Outcome: Progressing   

## 2021-03-30 DIAGNOSIS — F332 Major depressive disorder, recurrent severe without psychotic features: Secondary | ICD-10-CM | POA: Diagnosis not present

## 2021-03-30 LAB — HEMOGLOBIN A1C
Hgb A1c MFr Bld: 5.6 % (ref 4.8–5.6)
Mean Plasma Glucose: 114 mg/dL

## 2021-03-30 MED ORDER — IPRATROPIUM-ALBUTEROL 0.5-2.5 (3) MG/3ML IN SOLN
3.0000 mL | Freq: Two times a day (BID) | RESPIRATORY_TRACT | Status: DC
Start: 2021-03-30 — End: 2021-04-05
  Administered 2021-03-30 – 2021-04-04 (×10): 3 mL via RESPIRATORY_TRACT
  Filled 2021-03-30 (×13): qty 3

## 2021-03-30 NOTE — BH IP Treatment Plan (Signed)
Interdisciplinary Treatment and Diagnostic Plan Update  03/30/2021 Time of Session: 9:30 AM Rachel Nolan MRN: [id number]  Principal Diagnosis: Depression  Secondary Diagnoses: Principal Problem:   Depression Active Problems:   Post traumatic stress disorder (PTSD)   Cocaine use disorder (HCC)   Essential hypertension   Asthma   Nicotine dependence, cigarettes, uncomplicated   Current Medications:  Current Facility-Administered Medications  Medication Dose Route Frequency Provider Last Rate Last Admin   acetaminophen (TYLENOL) tablet 650 mg  650 mg Oral Q6H PRN Clapacs, John T, MD   650 mg at 03/29/21 1040   albuterol (PROVENTIL) (2.5 MG/3ML) 0.083% nebulizer solution 2.5 mg  2.5 mg Nebulization Q4H PRN Clapacs, John T, MD       albuterol (VENTOLIN HFA) 108 (90 Base) MCG/ACT inhaler 2 puff  2 puff Inhalation Q4H PRN Clapacs, John T, MD       alum & mag hydroxide-simeth (MAALOX/MYLANTA) 200-200-20 MG/5ML suspension 30 mL  30 mL Oral Q4H PRN Clapacs, John T, MD       FLUoxetine (PROZAC) capsule 40 mg  40 mg Oral q morning Clapacs, John T, MD   40 mg at 03/30/21 1019   gabapentin (NEURONTIN) capsule 200 mg  200 mg Oral TID Clapacs, John T, MD   200 mg at 03/30/21 5093   hydrOXYzine (ATARAX) tablet 50 mg  50 mg Oral TID PRN Clapacs, Jackquline Denmark, MD   50 mg at 03/29/21 2144   ipratropium-albuterol (DUONEB) 0.5-2.5 (3) MG/3ML nebulizer solution 3 mL  3 mL Nebulization TID Clapacs, John T, MD   3 mL at 03/30/21 2671   magnesium hydroxide (MILK OF MAGNESIA) suspension 30 mL  30 mL Oral Daily PRN Clapacs, John T, MD       montelukast (SINGULAIR) tablet 10 mg  10 mg Oral QHS Clapacs, John T, MD   10 mg at 03/29/21 2144   nicotine (NICODERM CQ - dosed in mg/24 hours) patch 21 mg  21 mg Transdermal Daily Clapacs, John T, MD   21 mg at 03/30/21 2458   penicillin v potassium (VEETID) tablet 500 mg  500 mg Oral Q8H Clapacs, John T, MD   500 mg at 03/30/21 0998   prazosin (MINIPRESS) capsule 1 mg  1 mg  Oral QHS Clapacs, John T, MD   1 mg at 03/29/21 2144   QUEtiapine (SEROQUEL) tablet 100 mg  100 mg Oral QHS Clapacs, John T, MD   100 mg at 03/29/21 2144   traZODone (DESYREL) tablet 100 mg  100 mg Oral QHS PRN Clapacs, Jackquline Denmark, MD   100 mg at 03/29/21 2144   PTA Medications: Medications Prior to Admission  Medication Sig Dispense Refill Last Dose   albuterol (PROVENTIL) (2.5 MG/3ML) 0.083% nebulizer solution Take 3 mLs (2.5 mg total) by nebulization every 4 (four) hours as needed for wheezing or shortness of breath. 75 mL 0    albuterol (VENTOLIN HFA) 108 (90 Base) MCG/ACT inhaler Inhale 2 puffs into the lungs every 6 (six) hours as needed. (Patient taking differently: Inhale 2 puffs into the lungs every 6 (six) hours as needed for shortness of breath or wheezing.) 8.5 g 0    amLODipine (NORVASC) 10 MG tablet Take 1 tablet (10 mg total) by mouth daily. 30 tablet 0    doxycycline (VIBRA-TABS) 100 MG tablet Take 1 tablet (100 mg total) by mouth every 12 (twelve) hours. (Patient not taking: Reported on 03/24/2021) 10 tablet 0    FLUoxetine (PROZAC) 40 MG capsule Take 1 capsule (  40 mg total) by mouth every morning. 30 capsule 0    folic acid (FOLVITE) 1 MG tablet Take 1 tablet (1 mg total) by mouth daily.      ibuprofen (ADVIL) 200 MG tablet Take 200 mg by mouth every 6 (six) hours as needed for headache (pain).      montelukast (SINGULAIR) 10 MG tablet Take 1 tablet (10 mg total) by mouth at bedtime. 30 tablet 0    nicotine (NICODERM CQ - DOSED IN MG/24 HOURS) 14 mg/24hr patch Place 1 patch (14 mg total) onto the skin daily. (Patient not taking: Reported on 02/23/2021) 28 patch 0    NON FORMULARY CPAP at bedtime      OXYGEN Inhale 2 L into the lungs continuous.      prazosin (MINIPRESS) 1 MG capsule Take 3 capsules (3 mg total) by mouth at bedtime. 90 capsule 0    predniSONE (STERAPRED UNI-PAK 21 TAB) 10 MG (21) TBPK tablet Use per pack instruction (Patient not taking: Reported on 03/24/2021) 21 tablet 0     QUEtiapine (SEROQUEL) 200 MG tablet Take 1 tablet (200 mg total) by mouth at bedtime. 30 tablet 0     Patient Stressors: Financial difficulties   Loss of nephew (2 years ago)   Substance abuse   Other: Homelessness    Patient Strengths: Ability for insight  Motivation for treatment/growth  Supportive family/friends   Treatment Modalities: Medication Management, Group therapy, Case management,  1 to 1 session with clinician, Psychoeducation, Recreational therapy.   Physician Treatment Plan for Primary Diagnosis: Depression Long Term Goal(s): Improvement in symptoms so as ready for discharge   Short Term Goals: Ability to maintain clinical measurements within normal limits will improve Ability to identify triggers associated with substance abuse/mental health issues will improve Ability to disclose and discuss suicidal ideas Ability to demonstrate self-control will improve  Medication Management: Evaluate patient's response, side effects, and tolerance of medication regimen.  Therapeutic Interventions: 1 to 1 sessions, Unit Group sessions and Medication administration.  Evaluation of Outcomes: Not Progressing  Physician Treatment Plan for Secondary Diagnosis: Principal Problem:   Depression Active Problems:   Post traumatic stress disorder (PTSD)   Cocaine use disorder (HCC)   Essential hypertension   Asthma   Nicotine dependence, cigarettes, uncomplicated  Long Term Goal(s): Improvement in symptoms so as ready for discharge   Short Term Goals: Ability to maintain clinical measurements within normal limits will improve Ability to identify triggers associated with substance abuse/mental health issues will improve Ability to disclose and discuss suicidal ideas Ability to demonstrate self-control will improve     Medication Management: Evaluate patient's response, side effects, and tolerance of medication regimen.  Therapeutic Interventions: 1 to 1 sessions, Unit Group  sessions and Medication administration.  Evaluation of Outcomes: Not Progressing   RN Treatment Plan for Primary Diagnosis: Depression Long Term Goal(s): Knowledge of disease and therapeutic regimen to maintain health will improve  Short Term Goals: Ability to remain free from injury will improve, Ability to participate in decision making will improve, Ability to verbalize feelings will improve, Ability to disclose and discuss suicidal ideas, Ability to identify and develop effective coping behaviors will improve, and Compliance with prescribed medications will improve  Medication Management: RN will administer medications as ordered by provider, will assess and evaluate patient's response and provide education to patient for prescribed medication. RN will report any adverse and/or side effects to prescribing provider.  Therapeutic Interventions: 1 on 1 counseling sessions, Psychoeducation, Medication administration, Evaluate  responses to treatment, Monitor vital signs and CBGs as ordered, Perform/monitor CIWA, COWS, AIMS and Fall Risk screenings as ordered, Perform wound care treatments as ordered.  Evaluation of Outcomes: Not Progressing   LCSW Treatment Plan for Primary Diagnosis: Depression Long Term Goal(s): Safe transition to appropriate next level of care at discharge, Engage patient in therapeutic group addressing interpersonal concerns.  Short Term Goals: Engage patient in aftercare planning with referrals and resources, Increase social support, Increase ability to appropriately verbalize feelings, Increase emotional regulation, Facilitate acceptance of mental health diagnosis and concerns, Facilitate patient progression through stages of change regarding substance use diagnoses and concerns, Identify triggers associated with mental health/substance abuse issues, and Increase skills for wellness and recovery  Therapeutic Interventions: Assess for all discharge needs, 1 to 1 time with  Social worker, Explore available resources and support systems, Assess for adequacy in community support network, Educate family and significant other(s) on suicide prevention, Complete Psychosocial Assessment, Interpersonal group therapy.  Evaluation of Outcomes: Not Progressing   Progress in Treatment: Attending groups: No. Participating in groups: No. Taking medication as prescribed: Yes. Toleration medication: Yes. Family/Significant other contact made: No, will contact:  once permission is given. At this time the patient has declined collateral contact.  Patient understands diagnosis: Yes. Discussing patient identified problems/goals with staff: Yes. Medical problems stabilized or resolved: Yes. Denies suicidal/homicidal ideation: Yes. Issues/concerns per patient self-inventory: No. Other: none  New problem(s) identified: Yes, Describe:  pt is often out of breath and it is documented that pt was on oxygen in the ED prior to admission to [facility]. Nursing to monitor.   New Short Term/Long Term Goal(s): detox, medication management for mood stabilization; elimination of SI thoughts; development of comprehensive mental wellness/sobriety plan.   Patient Goals:  "somewhere I can not be homeless anymore"  Discharge Plan or Barriers: CSW to assist patient in identifying appropriate aftercare plans.  Reason for Continuation of Hospitalization: Anxiety Depression Medical Issues Medication stabilization  Estimated Length of Stay: 1-7 days   Scribe for Treatment Team: Harden Mo, LCSW 03/30/2021 11:54 AM

## 2021-03-30 NOTE — Group Note (Signed)
[facility] LCSW Group Therapy Note ? ? ? ?Group Date: 03/30/2021 ?Start Time: 1300 ?End Time: 1400 ? ?Type of Therapy and Topic:  Group Therapy:  Overcoming Obstacles ? ?Participation Level:  [facility] PARTICIPATION LEVEL: Did Not Attend ? ?Mood: ? ?Description of Group:   ?In this group patients will be encouraged to explore what they see as obstacles to their own wellness and recovery. They will be guided to discuss their thoughts, feelings, and behaviors related to these obstacles. The group will process together ways to cope with barriers, with attention given to specific choices patients can make. Each patient will be challenged to identify changes they are motivated to make in order to overcome their obstacles. This group will be process-oriented, with patients participating in exploration of their own experiences as well as giving and receiving support and challenge from other group members. ? ?Therapeutic Goals: ?1. Patient will identify personal and current obstacles as they relate to admission. ?2. Patient will identify barriers that currently interfere with their wellness or overcoming obstacles.  ?3. Patient will identify feelings, thought process and behaviors related to these barriers. ?4. Patient will identify two changes they are willing to make to overcome these obstacles:  ? ? ?Summary of Patient Progress ? ? ?X ? ? ?Therapeutic Modalities:   ?Cognitive Behavioral Therapy ?Solution Focused Therapy ?Motivational Interviewing ?Relapse Prevention Therapy ? ? ?Naveena Eyman J Klint Lezcano, LCSW ?

## 2021-03-30 NOTE — Progress Notes (Signed)
Recreation Therapy Notes ? ? ? ?Date: 03/30/2021 ? ?Time: 10:50 am   ?  ?Location: Craft room   ? ?Behavioral response: N/A ?  ?Intervention Topic: Time Management   ? ?Discussion/Intervention: ?Patient did not attend group. ?  ?Clinical Observations/Feedback:  ?Patient did not attend group. ?  ?Brigit Doke LRT/CTRS ? ? ? ? ? ? ? ?Rachel Nolan ?03/30/2021 12:18 PM ?

## 2021-03-30 NOTE — Progress Notes (Signed)
Patient was cooperative with treatment on shift, she spent most of the shift resting in bed quietly, she had minimal interaction with peers and staff, but when patient was engaged in conversation with current the Clinical research associate the patient. the patient was logical but she was sad, she denies AVH, but she reports having passive suicidal thoughts. She was compliant with medication and had new behavioral issues to report on shift thus far. ?

## 2021-03-30 NOTE — Progress Notes (Signed)
Patient endorses back pain 8/10. PRN tylenol provided.  ?Patient denies anxiety and depression. Paitent denies SI/HI/AVH.   ?Patient compliant with medication administration. Patient isolative to room with the exception of coming out for meals.  ?Q15 minute safety checks maintained. Patient remains safe on the unit at this time. ?

## 2021-03-30 NOTE — Plan of Care (Signed)
?  Problem: Education: ?Goal: Knowledge of [facility] Education information/materials will improve ?Outcome: Progressing ?Goal: Verbalization of understanding the information provided will improve ?Outcome: Progressing ?  ?Problem: Health Behavior/Discharge Planning: ?Goal: Compliance with treatment plan for underlying cause of condition will improve ?Outcome: Progressing ?  ?Problem: Physical Regulation: ?Goal: Ability to maintain clinical measurements within normal limits will improve ?Outcome: Progressing ?  ?Problem: Safety: ?Goal: Periods of time without injury will increase ?Outcome: Progressing ?  ?Problem: Education: ?Goal: Knowledge of the prescribed therapeutic regimen will improve ?Outcome: Progressing ?  ?Problem: Coping: ?Goal: Will verbalize feelings ?Outcome: Progressing ?  ?Problem: Health Behavior/Discharge Planning: ?Goal: Compliance with therapeutic regimen will improve ?Outcome: Progressing ?  ?

## 2021-03-30 NOTE — Progress Notes (Signed)
[facility] MD Progress Note  03/30/2021 4:34 PM Rachel Nolan  MRN:  [id number] Subjective: Patient seen and chart reviewed.  51 year old woman with history of depression and substance abuse.  Patient was also seen by treatment team.  She identified her chief problem as being having nowhere to live.  She denies acute suicidal ideation.  Mood is still dysphoric and down.  Continues to have frequent episodes of shortness of breath even with a little bit of exertion. Principal Problem: Depression Diagnosis: Principal Problem:   Depression Active Problems:   Post traumatic stress disorder (PTSD)   Cocaine use disorder (HCC)   Essential hypertension   Asthma   Nicotine dependence, cigarettes, uncomplicated  Total Time spent with patient: 30 minutes  Past Psychiatric History: Past history of substance abuse chronic depression homelessness  Past Medical History:  Past Medical History:  Diagnosis Date   Anxiety    Asthma    COPD (chronic obstructive pulmonary disease) (HCC)    Depression    Hypertension    Scoliosis     Past Surgical History:  Procedure Laterality Date   BACK SURGERY     ECTOPIC PREGNANCY SURGERY     Family History:  Family History  Problem Relation Age of Onset   Hypertension Mother    Hypertension Father    Family Psychiatric  History: See previous Social History:  Social History   Substance and Sexual Activity  Alcohol Use Yes   Comment: one 40 oz per month     Social History   Substance and Sexual Activity  Drug Use Yes   Types: Marijuana, Cocaine, "Crack" cocaine, Heroin    Social History   Socioeconomic History   Marital status: Widowed    Spouse name: Not on file   Number of children: Not on file   Years of education: Not on file   Highest education level: Not on file  Occupational History   Not on file  Tobacco Use   Smoking status: Every Day    Packs/day: 1.00    Types: Cigarettes   Smokeless tobacco: Never  Vaping Use   Vaping Use:  Never used  Substance and Sexual Activity   Alcohol use: Yes    Comment: one 40 oz per month   Drug use: Yes    Types: Marijuana, Cocaine, "Crack" cocaine, Heroin   Sexual activity: Not Currently  Other Topics Concern   Not on file  Social History Narrative   Pt is homeless, no fixed address; not followed by an outpatient psychiatrist   Social Determinants of Health   Financial Resource Strain: Not on file  Food Insecurity: Not on file  Transportation Needs: Not on file  Physical Activity: Not on file  Stress: Not on file  Social Connections: Not on file   Additional Social History:                         Sleep: Fair  Appetite:  Fair  Current Medications: Current Facility-Administered Medications  Medication Dose Route Frequency Provider Last Rate Last Admin   acetaminophen (TYLENOL) tablet 650 mg  650 mg Oral Q6H PRN Akasha Melena T, MD   650 mg at 03/30/21 1429   albuterol (PROVENTIL) (2.5 MG/3ML) 0.083% nebulizer solution 2.5 mg  2.5 mg Nebulization Q4H PRN Gigi Onstad T, MD       albuterol (VENTOLIN HFA) 108 (90 Base) MCG/ACT inhaler 2 puff  2 puff Inhalation Q4H PRN Maansi Wike, Jackquline Denmark, MD  alum & mag hydroxide-simeth (MAALOX/MYLANTA) 200-200-20 MG/5ML suspension 30 mL  30 mL Oral Q4H PRN Terek Bee T, MD       FLUoxetine (PROZAC) capsule 40 mg  40 mg Oral q morning Crixus Mcaulay T, MD   40 mg at 03/30/21 1019   gabapentin (NEURONTIN) capsule 200 mg  200 mg Oral TID Maddie Brazier T, MD   200 mg at 03/30/21 1154   hydrOXYzine (ATARAX) tablet 50 mg  50 mg Oral TID PRN Keyanah Kozicki T, MD   50 mg at 03/29/21 2144   ipratropium-albuterol (DUONEB) 0.5-2.5 (3) MG/3ML nebulizer solution 3 mL  3 mL Nebulization BID Ura Yingling T, MD       magnesium hydroxide (MILK OF MAGNESIA) suspension 30 mL  30 mL Oral Daily PRN Defne Gerling T, MD       montelukast (SINGULAIR) tablet 10 mg  10 mg Oral QHS Elisheba Mcdonnell T, MD   10 mg at 03/29/21 2144   nicotine (NICODERM CQ  - dosed in mg/24 hours) patch 21 mg  21 mg Transdermal Daily Adalay Azucena, Jackquline DenmarkJohn T, MD   21 mg at 03/30/21 16100824   penicillin v potassium (VEETID) tablet 500 mg  500 mg Oral Q8H Lanina Larranaga T, MD   500 mg at 03/30/21 1426   prazosin (MINIPRESS) capsule 1 mg  1 mg Oral QHS Shaquandra Galano T, MD   1 mg at 03/29/21 2144   QUEtiapine (SEROQUEL) tablet 100 mg  100 mg Oral QHS Delyla Sandeen T, MD   100 mg at 03/29/21 2144   traZODone (DESYREL) tablet 100 mg  100 mg Oral QHS PRN Myrla Malanowski, Jackquline DenmarkJohn T, MD   100 mg at 03/29/21 2144    Lab Results: No results found for this or any previous visit (from the past 48 hour(s)).  Blood Alcohol level:  Lab Results  Component Value Date   ETH <10 03/23/2021   ETH <10 09/21/2020    Metabolic Disorder Labs: Lab Results  Component Value Date   HGBA1C 5.6 03/28/2021   MPG 114 03/28/2021   MPG 111 09/21/2020   No results found for: PROLACTIN Lab Results  Component Value Date   CHOL 149 03/28/2021   TRIG 200 (H) 03/28/2021   HDL 52 03/28/2021   CHOLHDL 2.9 03/28/2021   VLDL 40 03/28/2021   LDLCALC 57 03/28/2021   LDLCALC 56 09/21/2020    Physical Findings: AIMS:  , ,  ,  ,    CIWA:    COWS:     Musculoskeletal: Strength & Muscle Tone: within normal limits Gait & Station: normal Patient leans: N/A  Psychiatric Specialty Exam:  Presentation  General Appearance: Appropriate for Environment; Fairly Groomed  Eye Contact:Fair  Speech:Clear and Coherent  Speech Volume:Normal  Handedness:Right   Mood and Affect  Mood:Hopeless; Dysphoric  Affect:Congruent; Depressed   Thought Process  Thought Processes:Coherent  Descriptions of Associations:Intact  Orientation:Full (Time, Place and Person)  Thought Content:Rumination  History of Schizophrenia/Schizoaffective disorder:No  Duration of Psychotic Symptoms:N/A  Hallucinations:No data recorded Ideas of Reference:None  Suicidal Thoughts:No data recorded Homicidal Thoughts:No data  recorded  Sensorium  Memory:Immediate Good; Recent Good; Remote Good  Judgment:Impaired  Insight:Lacking   Executive Functions  Concentration:Fair  Attention Span:Fair  Recall:Good  Fund of Knowledge:Good  Language:Good   Psychomotor Activity  Psychomotor Activity:No data recorded  Assets  Assets:Communication Skills; Desire for Improvement   Sleep  Sleep:No data recorded   Physical Exam: Physical Exam Vitals and nursing note reviewed.  Constitutional:  Appearance: Normal appearance.  HENT:     Head: Normocephalic and atraumatic.     Mouth/Throat:     Pharynx: Oropharynx is clear.  Eyes:     Pupils: Pupils are equal, round, and reactive to light.  Cardiovascular:     Rate and Rhythm: Normal rate and regular rhythm.  Pulmonary:     Effort: Pulmonary effort is normal.     Breath sounds: Normal breath sounds.  Abdominal:     General: Abdomen is flat.     Palpations: Abdomen is soft.  Musculoskeletal:        General: Normal range of motion.  Skin:    General: Skin is warm and dry.  Neurological:     General: No focal deficit present.     Mental Status: She is alert. Mental status is at baseline.  Psychiatric:        Attention and Perception: Attention normal.        Mood and Affect: Mood normal. Affect is blunt.        Speech: Speech is delayed.        Behavior: Behavior is slowed.        Thought Content: Thought content normal.        Cognition and Memory: Cognition is impaired.   Review of Systems  Constitutional: Negative.   HENT: Negative.    Eyes: Negative.   Respiratory:  Positive for shortness of breath.   Cardiovascular: Negative.   Gastrointestinal: Negative.   Musculoskeletal: Negative.   Skin: Negative.   Neurological: Negative.   Psychiatric/Behavioral:  Positive for depression, substance abuse and suicidal ideas. Negative for hallucinations. The patient is nervous/anxious and has insomnia.   Blood pressure 114/85, pulse (!)  109, temperature 98.9 F (37.2 C), temperature source Oral, resp. rate 18, height 5\' 6"  (1.676 m), weight 79.4 kg, last menstrual period 06/19/2018, SpO2 94 %. Body mass index is 28.25 kg/m.   Treatment Plan Summary: Medication management and Plan no change to current psychiatric medicine.  We are concerned about her shortness of breath.  Ask nursing to try measuring a pulse oximeter after having her walk gently around the unit today.  I am concerned she might be desaturating.  Continue CPAP at night.  Treatment team trying to work on whether there is any appropriate discharge plan for her that may involve substance abuse treatment.  08/19/2018, MD 03/30/2021, 4:34 PM

## 2021-03-31 DIAGNOSIS — F332 Major depressive disorder, recurrent severe without psychotic features: Secondary | ICD-10-CM | POA: Diagnosis not present

## 2021-03-31 MED ORDER — TRAZODONE HCL 50 MG PO TABS
150.0000 mg | ORAL_TABLET | Freq: Every evening | ORAL | Status: DC | PRN
  Administered 2021-03-31 – 2021-04-04 (×4): 150 mg via ORAL
  Filled 2021-03-31 (×4): qty 1

## 2021-03-31 NOTE — Plan of Care (Signed)
?  Problem: Education: ?Goal: Knowledge of [facility] Education information/materials will improve ?Outcome: Progressing ?Goal: Emotional status will improve ?Outcome: Progressing ?  ?Problem: Safety: ?Goal: Periods of time without injury will increase ?Outcome: Progressing ?  ?

## 2021-03-31 NOTE — Progress Notes (Signed)
Patient isolative to self and room. Out for snack. Ambulates with walker, states feeling better this evening. Respiratory therapy in for breathing treatment and connect patient to cpap. Medication compliant. Prn given for sleep and anxiety at patient request with good relief. Denies Si, Hi, AVh. Appropriate with staff. No interaction with peers. ?Encouragement and support provided. Safety checks maintained. Medications given as prescribed. Pt receptive and remains safe on unit with q 15 min checks. ?

## 2021-03-31 NOTE — Progress Notes (Signed)
Recreation Therapy Notes ? ?INPATIENT RECREATION TR PLAN ? ?Patient Details ?Name: Rachel Nolan ?MRN: [id number] ?DOB: 1970/09/29 ?Today's Date: 03/31/2021 ? ?Rec Therapy Plan ?Is patient appropriate for Therapeutic Recreation?: Yes ?Treatment times per week: at least 3 ?Estimated Length of Stay: 5-7 days ?TR Treatment/Interventions: Group participation (Comment) ? ?Discharge Criteria ?Pt will be discharged from therapy if:: Discharged ?Treatment plan/goals/alternatives discussed and agreed upon by:: Patient/family ? ?Discharge Summary ?  ? ? ?Solace Wendorff ?03/31/2021, 12:55 PM ?

## 2021-03-31 NOTE — Progress Notes (Signed)
[facility] MD Progress Note ? ?03/31/2021 6:38 PM ?Rachel Nolan  ?MRN:  [id number] ?Subjective: Patient continues to report depressed mood but less suicidal ideation.  Patient is currently calm and cooperative primarily focused on homelessness ?Principal Problem: Depression ?Diagnosis: Principal Problem: ?  Depression ?Active Problems: ?  Post traumatic stress disorder (PTSD) ?  Cocaine use disorder (HCC) ?  Essential hypertension ?  Asthma ?  Nicotine dependence, cigarettes, uncomplicated ? ?Total Time spent with patient: 30 minutes ? ?Past Psychiatric History: Past history of substance abuse ? ?Past Medical History:  ?Past Medical History:  ?Diagnosis Date  ? Anxiety   ? Asthma   ? COPD (chronic obstructive pulmonary disease) (HCC)   ? Depression   ? Hypertension   ? Scoliosis   ?  ?Past Surgical History:  ?Procedure Laterality Date  ? BACK SURGERY    ? ECTOPIC PREGNANCY SURGERY    ? ?Family History:  ?Family History  ?Problem Relation Age of Onset  ? Hypertension Mother   ? Hypertension Father   ? ?Family Psychiatric  History: See previous ?Social History:  ?Social History  ? ?Substance and Sexual Activity  ?Alcohol Use Yes  ? Comment: one 40 oz per month  ?   ?Social History  ? ?Substance and Sexual Activity  ?Drug Use Yes  ? Types: Marijuana, Cocaine, "Crack" cocaine, Heroin  ?  ?Social History  ? ?Socioeconomic History  ? Marital status: Widowed  ?  Spouse name: Not on file  ? Number of children: Not on file  ? Years of education: Not on file  ? Highest education level: Not on file  ?Occupational History  ? Not on file  ?Tobacco Use  ? Smoking status: Every Day  ?  Packs/day: 1.00  ?  Types: Cigarettes  ? Smokeless tobacco: Never  ?Vaping Use  ? Vaping Use: Never used  ?Substance and Sexual Activity  ? Alcohol use: Yes  ?  Comment: one 40 oz per month  ? Drug use: Yes  ?  Types: Marijuana, Cocaine, "Crack" cocaine, Heroin  ? Sexual activity: Not Currently  ?Other Topics Concern  ? Not on file  ?Social History  Narrative  ? Pt is homeless, no fixed address; not followed by an outpatient psychiatrist  ? ?Social Determinants of Health  ? ?Financial Resource Strain: Not on file  ?Food Insecurity: Not on file  ?Transportation Needs: Not on file  ?Physical Activity: Not on file  ?Stress: Not on file  ?Social Connections: Not on file  ? ?Additional Social History:  ?  ?  ?  ?  ?  ?  ?  ?  ?  ?  ?  ? ?Sleep: Fair ? ?Appetite:  Fair ? ?Current Medications: ?Current Facility-Administered Medications  ?Medication Dose Route Frequency Provider Last Rate Last Admin  ? acetaminophen (TYLENOL) tablet 650 mg  650 mg Oral Q6H PRN Sadrac Zeoli, Jackquline Denmark, MD   650 mg at 03/30/21 1429  ? albuterol (PROVENTIL) (2.5 MG/3ML) 0.083% nebulizer solution 2.5 mg  2.5 mg Nebulization Q4H PRN Leia Coletti T, MD      ? albuterol (VENTOLIN HFA) 108 (90 Base) MCG/ACT inhaler 2 puff  2 puff Inhalation Q4H PRN Dolora Ridgely T, MD      ? alum & mag hydroxide-simeth (MAALOX/MYLANTA) 200-200-20 MG/5ML suspension 30 mL  30 mL Oral Q4H PRN Laquasha Groome T, MD      ? FLUoxetine (PROZAC) capsule 40 mg  40 mg Oral q morning Chaia Ikard, Jackquline Denmark, MD   40  mg at 03/30/21 1019  ? gabapentin (NEURONTIN) capsule 200 mg  200 mg Oral TID Nithila Sumners, Jackquline Denmark, MD   200 mg at 03/31/21 1626  ? hydrOXYzine (ATARAX) tablet 50 mg  50 mg Oral TID PRN Terril Chestnut, Jackquline Denmark, MD   50 mg at 03/31/21 4765  ? ipratropium-albuterol (DUONEB) 0.5-2.5 (3) MG/3ML nebulizer solution 3 mL  3 mL Nebulization BID Kora Groom T, MD   3 mL at 03/31/21 0745  ? magnesium hydroxide (MILK OF MAGNESIA) suspension 30 mL  30 mL Oral Daily PRN Shatina Streets T, MD      ? montelukast (SINGULAIR) tablet 10 mg  10 mg Oral QHS Vaniyah Lansky T, MD   10 mg at 03/30/21 2145  ? nicotine (NICODERM CQ - dosed in mg/24 hours) patch 21 mg  21 mg Transdermal Daily Savvas Roper, Jackquline Denmark, MD   21 mg at 03/30/21 4650  ? prazosin (MINIPRESS) capsule 1 mg  1 mg Oral QHS Evelena Masci, Jackquline Denmark, MD   1 mg at 03/31/21 3546  ? QUEtiapine (SEROQUEL) tablet  100 mg  100 mg Oral QHS Ardath Lepak, Jackquline Denmark, MD   100 mg at 03/30/21 2145  ? traZODone (DESYREL) tablet 150 mg  150 mg Oral QHS PRN Charline Hoskinson, Jackquline Denmark, MD      ? ? ?Lab Results: No results found for this or any previous visit (from the past 48 hour(s)). ? ?Blood Alcohol level:  ?Lab Results  ?Component Value Date  ? ETH <10 03/23/2021  ? ETH <10 09/21/2020  ? ? ?Metabolic Disorder Labs: ?Lab Results  ?Component Value Date  ? HGBA1C 5.6 03/28/2021  ? MPG 114 03/28/2021  ? MPG 111 09/21/2020  ? ?No results found for: PROLACTIN ?Lab Results  ?Component Value Date  ? CHOL 149 03/28/2021  ? TRIG 200 (H) 03/28/2021  ? HDL 52 03/28/2021  ? CHOLHDL 2.9 03/28/2021  ? VLDL 40 03/28/2021  ? LDLCALC 57 03/28/2021  ? LDLCALC 56 09/21/2020  ? ? ?Physical Findings: ?AIMS:  , ,  ,  ,    ?CIWA:    ?COWS:    ? ?Musculoskeletal: ?Strength & Muscle Tone: within normal limits ?Gait & Station: normal ?Patient leans: N/A ? ?Psychiatric Specialty Exam: ? ?Presentation  ?General Appearance: Appropriate for Environment; Fairly Groomed ? ?Eye Contact:Fair ? ?Speech:Clear and Coherent ? ?Speech Volume:Normal ? ?Handedness:Right ? ? ?Mood and Affect  ?Mood:Hopeless; Dysphoric ? ?Affect:Congruent; Depressed ? ? ?Thought Process  ?Thought Processes:Coherent ? ?Descriptions of Associations:Intact ? ?Orientation:Full (Time, Place and Person) ? ?Thought Content:Rumination ? ?History of Schizophrenia/Schizoaffective disorder:No ? ?Duration of Psychotic Symptoms:N/A ? ?Hallucinations:No data recorded ?Ideas of Reference:None ? ?Suicidal Thoughts:No data recorded ?Homicidal Thoughts:No data recorded ? ?Sensorium  ?Memory:Immediate Good; Recent Good; Remote Good ? ?Judgment:Impaired ? ?Insight:Lacking ? ? ?Executive Functions  ?Concentration:Fair ? ?Attention Span:Fair ? ?Recall:Good ? ?Fund of Knowledge:Good ? ?Language:Good ? ? ?Psychomotor Activity  ?Psychomotor Activity:No data recorded ? ?Assets  ?Assets:Communication Skills; Desire for  Improvement ? ? ?Sleep  ?Sleep:No data recorded ? ? ?Physical Exam: ?Physical Exam ?Vitals and nursing note reviewed.  ?Constitutional:   ?   Appearance: Normal appearance.  ?HENT:  ?   Head: Normocephalic and atraumatic.  ?   Mouth/Throat:  ?   Pharynx: Oropharynx is clear.  ?Eyes:  ?   Pupils: Pupils are equal, round, and reactive to light.  ?Cardiovascular:  ?   Rate and Rhythm: Normal rate and regular rhythm.  ?Pulmonary:  ?   Effort: Pulmonary effort is normal.  ?  Breath sounds: Normal breath sounds.  ?Abdominal:  ?   General: Abdomen is flat.  ?   Palpations: Abdomen is soft.  ?Musculoskeletal:     ?   General: Normal range of motion.  ?Skin: ?   General: Skin is warm and dry.  ?Neurological:  ?   General: No focal deficit present.  ?   Mental Status: She is alert. Mental status is at baseline.  ?Psychiatric:     ?   Mood and Affect: Mood normal.     ?   Thought Content: Thought content normal.  ? ?Review of Systems  ?Constitutional: Negative.   ?HENT: Negative.    ?Eyes: Negative.   ?Respiratory: Negative.    ?Cardiovascular: Negative.   ?Gastrointestinal: Negative.   ?Musculoskeletal: Negative.   ?Skin: Negative.   ?Neurological: Negative.   ?Psychiatric/Behavioral: Negative.    ?Blood pressure (!) 137/114, pulse (!) 130, temperature 98.7 ?F (37.1 ?C), temperature source Oral, resp. rate 18, height 5\' 6"  (1.676 m), weight 79.4 kg, last menstrual period 06/19/2018, SpO2 100 %. Body mass index is 28.25 kg/m?. ? ? ?Treatment Plan Summary: ?Plan no change to current psychiatric treatment plan.  Searching for appropriate disposition. ? ?Mordecai RasmussenJohn Andersyn Fragoso, MD ?03/31/2021, 6:38 PM ? ?

## 2021-03-31 NOTE — Progress Notes (Signed)
Recreation Therapy Notes ? ?INPATIENT RECREATION THERAPY ASSESSMENT ? ?Patient Details ?Name: Rachel Nolan ?MRN: [id number] ?DOB: 1970/05/02 ?Today's Date: 03/31/2021 ?      ?Information Obtained From: ?Patient ? ?Able to Participate in Assessment/Interview: ?Yes ? ?Patient Presentation: ?Responsive ? ?Reason for Admission (Per Patient): ?Active Symptoms ? ?Patient Stressors: ?  ? ?Coping Skills:   ?Prayer ? ?Leisure Interests (2+):  ? (None) ? ?Frequency of Recreation/Participation: ?Weekly ? ?Awareness of Community Resources:  ?Yes ? ?Community Resources:  ?Other (Comment) (Envisions of life) ? ?Current Use: ?Yes ? ?If no, Barriers?: ?  ? ?Expressed Interest in [employer] Information: ?Yes ? ?[place] of Residence:  ?[place] ? ?Patient Main Form of Transportation: ?Therapist, music ? ?Patient Strengths:  ?Good person ? ?Patient Identified Areas of Improvement:  ?Living situation ? ?Patient Goal for Hospitalization:  ?Not be homeless ? ?Current SI (including self-harm):  ?No ? ?Current HI:  ?No ? ?Current AVH: ?No ? ?Staff Intervention Plan: ?Group Attendance, Collaborate with Interdisciplinary Treatment Team ? ?Consent to Intern Participation: ?N/A ? ?Rachel Nolan ?03/31/2021, 12:54 PM ?

## 2021-03-31 NOTE — Progress Notes (Signed)
Recreation Therapy Notes ? ? ?Date: 03/31/2021 ? ?Time: 10:20 am ? ?Location: Craft room ? ?Behavioral response: N/A ? ?Intervention Topic: Emotions  ? ?Discussion/Intervention: ?Patient did not attend group. ? ?Clinical Observations/Feedback: ?Patient did not attend group. ? ?Rachel Nolan LRT/CTRS ? ? ? ? ? ? ? ?Rachel Nolan ?03/31/2021 11:19 AM ?

## 2021-03-31 NOTE — Progress Notes (Signed)
The patient has been sleeping most of the shift. She let me know that she has not been getting restful sleep. Her trazadone was increased from 100mg  to 150mg . She has been up for meals, she ambulates with a walker. She does have some labored breathing. Some of her meds were held because she was sleeping. Will continue to monitor and provide support.  ? ? , RN ? ?

## 2021-03-31 NOTE — Group Note (Signed)
LCSW Group Therapy Note ? ? ?Group Date: 03/31/2021 ?Start Time: 1300 ?End Time: 1400 ? ? ?Type of Therapy and Topic:  Group Therapy: Boundaries ? ?Participation Level:  Did Not Attend ? ?Description of Group: ?This group will address the use of boundaries in their personal lives. Patients will explore why boundaries are important, the difference between healthy and unhealthy boundaries, and negative and postive outcomes of different boundaries and will look at how boundaries can be crossed.  Patients will be encouraged to identify current boundaries in their own lives and identify what kind of boundary is being set. Facilitators will guide patients in utilizing problem-solving interventions to address and correct types boundaries being used and to address when no boundary is being used. Understanding and applying boundaries will be explored and addressed for obtaining and maintaining a balanced life. Patients will be encouraged to explore ways to assertively make their boundaries and needs known to significant others in their lives, using other group members and facilitator for role play, support, and feedback. ? ?Therapeutic Goals: ? ?1.  Patient will identify areas in their life where setting clear boundaries could be  used to improve their life.  ?2.  Patient will identify signs/triggers that a boundary is not being respected. ?3.  Patient will identify two ways to set boundaries in order to achieve balance in  their lives: ?4.  Patient will demonstrate ability to communicate their needs and set boundaries  through discussion and/or role plays ? ?Summary of Patient Progress:  Patient did not attend group despite encouraged participation.  ? ?Therapeutic Modalities:   ?Cognitive Behavioral Therapy ?Solution-Focused Therapy ? ?Corky Crafts, LCSWA ?03/31/2021  2:00 PM   ? ?

## 2021-03-31 NOTE — Progress Notes (Signed)
BP was 137/114 Prazosin/Minipress that is normally given at night was given to help bring down levels. Also given Vistaril to help with feeling's of anxiety. ? ? ? ?C Butler-Nicholson, LPN ?

## 2021-04-01 DIAGNOSIS — F332 Major depressive disorder, recurrent severe without psychotic features: Secondary | ICD-10-CM | POA: Diagnosis not present

## 2021-04-01 NOTE — Plan of Care (Signed)
?  Problem: Education: ?Goal: Knowledge of [facility] Education information/materials will improve ?Outcome: Progressing ?Goal: Emotional status will improve ?Outcome: Progressing ?Goal: Mental status will improve ?Outcome: Progressing ?Goal: Verbalization of understanding the information provided will improve ?Outcome: Progressing ?  ?Problem: Coping: ?Goal: Ability to verbalize frustrations and anger appropriately will improve ?Outcome: Progressing ?  ?Problem: Health Behavior/Discharge Planning: ?Goal: Identification of resources available to assist in meeting health care needs will improve ?Outcome: Progressing ?Goal: Compliance with treatment plan for underlying cause of condition will improve ?Outcome: Progressing ?  ?Problem: Education: ?Goal: Knowledge of the prescribed therapeutic regimen will improve ?Outcome: Progressing ?  ?Problem: Health Behavior/Discharge Planning: ?Goal: Compliance with therapeutic regimen will improve ?Outcome: Progressing ?  ?Problem: Self-Concept: ?Goal: Level of anxiety will decrease ?Outcome: Progressing ?  ?

## 2021-04-01 NOTE — Group Note (Signed)
LCSW Group Therapy Note ? ?Group Date: 04/01/2021 ?Start Time: 1300 ?End Time: 1400 ? ? ?Type of Therapy and Topic:  Group Therapy - How To Cope with Nervousness about Discharge  ? ?Participation Level:  Did Not Attend  ? ?Description of Group ?This process group involved identification of patients' feelings about discharge. Some of them are scheduled to be discharged soon, while others are new admissions, but each of them was asked to share thoughts and feelings surrounding discharge from the hospital. One common theme was that they are excited at the prospect of going home, while another was that many of them are apprehensive about sharing why they were hospitalized. Patients were given the opportunity to discuss these feelings with their peers in preparation for discharge. ? ?Therapeutic Goals ? ?Patient will identify their overall feelings about pending discharge. ?Patient will think about how they might proactively address issues that they believe will once again arise once they get home (i.e. with parents). ?Patients will participate in discussion about having hope for change. ? ? ?Summary of Patient Progress:   ?X ? ?Therapeutic Modalities ?Cognitive Behavioral Therapy ? ? ?Harden Mo, LCSWA ?04/01/2021  2:10 PM   ?

## 2021-04-01 NOTE — Progress Notes (Signed)
[facility] ? ?04/01/2021 2:51 PM ?Rachel LandrySandra Nolan  ?MRN:  [id number] ?Subjective: Follow-up 51 year old woman with depression and multiple medical problems substance abuse.  Patient continues to complain of shortness of breath and general fatigue.  Mood is mildly dysphoric.  Denies any suicidal thoughts.  Mostly focused on wanting to find a safe place for discharge.  Continues to feel uncomfortable going back to her sister's home because there is drug use there. ?Principal Problem: Depression ?Diagnosis: Principal Problem: ?  Depression ?Active Problems: ?  Post traumatic stress disorder (PTSD) ?  Cocaine use disorder (HCC) ?  Essential hypertension ?  Asthma ?  Nicotine dependence, cigarettes, uncomplicated ? ?Total Time spent with patient: 30 minutes ? ?Past Psychiatric History: Past history of substance abuse and depression. ? ?Past Medical History:  ?Past Medical History:  ?Diagnosis Date  ? Anxiety   ? Asthma   ? COPD (chronic obstructive pulmonary disease) (HCC)   ? Depression   ? Hypertension   ? Scoliosis   ?  ?Past Surgical History:  ?Procedure Laterality Date  ? BACK SURGERY    ? ECTOPIC PREGNANCY SURGERY    ? ?Family History:  ?Family History  ?Problem Relation Age of Onset  ? Hypertension Mother   ? Hypertension Father   ? ?Family Psychiatric  History: See previous ?Social History:  ?Social History  ? ?Substance and Sexual Activity  ?Alcohol Use Yes  ? Comment: one 40 oz per month  ?   ?Social History  ? ?Substance and Sexual Activity  ?Drug Use Yes  ? Types: Marijuana, Cocaine, "Crack" cocaine, Heroin  ?  ?Social History  ? ?Socioeconomic History  ? Marital status: Widowed  ?  Spouse name: Not on file  ? Number of children: Not on file  ? Years of education: Not on file  ? Highest education level: Not on file  ?Occupational History  ? Not on file  ?Tobacco Use  ? Smoking status: Every Day  ?  Packs/day: 1.00  ?  Types: Cigarettes  ? Smokeless tobacco: Never  ?Vaping Use  ? Vaping Use: Never used   ?Substance and Sexual Activity  ? Alcohol use: Yes  ?  Comment: one 40 oz per month  ? Drug use: Yes  ?  Types: Marijuana, Cocaine, "Crack" cocaine, Heroin  ? Sexual activity: Not Currently  ?Other Topics Concern  ? Not on file  ?Social History Narrative  ? Pt is homeless, no fixed address; not followed by an outpatient psychiatrist  ? ?Social Determinants of Health  ? ?Financial Resource Strain: Not on file  ?Food Insecurity: Not on file  ?Transportation Needs: Not on file  ?Physical Activity: Not on file  ?Stress: Not on file  ?Social Connections: Not on file  ? ?Additional Social History:  ?  ?  ?  ?  ?  ?  ?  ?  ?  ?  ?  ? ?Sleep: Fair ? ?Appetite:  Fair ? ?Current Medications: ?Current Facility-Administered Medications  ?Medication Dose Route Frequency Provider Last Rate Last Admin  ? acetaminophen (TYLENOL) tablet 650 mg  650 mg Oral Q6H PRN Ames Hoban T, MD   650 mg at 03/30/21 1429  ? albuterol (PROVENTIL) (2.5 MG/3ML) 0.083% nebulizer solution 2.5 mg  2.5 mg Nebulization Q4H PRN Cori Henningsen T, MD      ? albuterol (VENTOLIN HFA) 108 (90 Base) MCG/ACT inhaler 2 puff  2 puff Inhalation Q4H PRN Keasha Malkiewicz, Jackquline DenmarkJohn T, MD      ? alum &  mag hydroxide-simeth (MAALOX/MYLANTA) 200-200-20 MG/5ML suspension 30 mL  30 mL Oral Q4H PRN Eleshia Wooley T, MD      ? FLUoxetine (PROZAC) capsule 40 mg  40 mg Oral q morning Ashaki Frosch, Jackquline Denmark, MD   40 mg at 04/01/21 3790  ? gabapentin (NEURONTIN) capsule 200 mg  200 mg Oral TID Alekhya Gravlin T, MD   200 mg at 04/01/21 1139  ? hydrOXYzine (ATARAX) tablet 50 mg  50 mg Oral TID PRN Jimel Myler, Jackquline Denmark, MD   50 mg at 03/31/21 2409  ? ipratropium-albuterol (DUONEB) 0.5-2.5 (3) MG/3ML nebulizer solution 3 mL  3 mL Nebulization BID Jayjay Littles T, MD   3 mL at 04/01/21 7353  ? magnesium hydroxide (MILK OF MAGNESIA) suspension 30 mL  30 mL Oral Daily PRN Sidnie Swalley T, MD      ? montelukast (SINGULAIR) tablet 10 mg  10 mg Oral QHS Cuyler Vandyken T, MD   10 mg at 03/31/21 2132  ? nicotine  (NICODERM CQ - dosed in mg/24 hours) patch 21 mg  21 mg Transdermal Daily Eustacio Ellen, Jackquline Denmark, MD   21 mg at 04/01/21 2992  ? prazosin (MINIPRESS) capsule 1 mg  1 mg Oral QHS Jannette Cotham, Jackquline Denmark, MD   1 mg at 03/31/21 2133  ? QUEtiapine (SEROQUEL) tablet 100 mg  100 mg Oral QHS Fallyn Munnerlyn, Jackquline Denmark, MD   100 mg at 03/31/21 2133  ? traZODone (DESYREL) tablet 150 mg  150 mg Oral QHS PRN Sontee Desena, Jackquline Denmark, MD   150 mg at 03/31/21 2132  ? ? ?Lab Results: No results found for this or any previous visit (from the past 48 hour(s)). ? ?Blood Alcohol level:  ?Lab Results  ?Component Value Date  ? ETH <10 03/23/2021  ? ETH <10 09/21/2020  ? ? ?Metabolic Disorder Labs: ?Lab Results  ?Component Value Date  ? HGBA1C 5.6 03/28/2021  ? MPG 114 03/28/2021  ? MPG 111 09/21/2020  ? ?No results found for: PROLACTIN ?Lab Results  ?Component Value Date  ? CHOL 149 03/28/2021  ? TRIG 200 (H) 03/28/2021  ? HDL 52 03/28/2021  ? CHOLHDL 2.9 03/28/2021  ? VLDL 40 03/28/2021  ? LDLCALC 57 03/28/2021  ? LDLCALC 56 09/21/2020  ? ? ?Physical Findings: ?AIMS:  , ,  ,  ,    ?CIWA:    ?COWS:    ? ?Musculoskeletal: ?Strength & Muscle Tone: within normal limits ?Gait & Station: normal ?Patient leans: N/A ? ?Psychiatric Specialty Exam: ? ?Presentation  ?General Appearance: Appropriate for Environment; Fairly Groomed ? ?Eye Contact:Fair ? ?Speech:Clear and Coherent ? ?Speech Volume:Normal ? ?Handedness:Right ? ? ?Mood and Affect  ?Mood:Hopeless; Dysphoric ? ?Affect:Congruent; Depressed ? ? ?Thought Process  ?Thought Processes:Coherent ? ?Descriptions of Associations:Intact ? ?Orientation:Full (Time, Place and Person) ? ?Thought Content:Rumination ? ?History of Schizophrenia/Schizoaffective disorder:No ? ?Duration of Psychotic Symptoms:N/A ? ?Hallucinations:No data recorded ?Ideas of Reference:None ? ?Suicidal Thoughts:No data recorded ?Homicidal Thoughts:No data recorded ? ?Sensorium  ?Memory:Immediate Good; Recent Good; Remote  Good ? ?Judgment:Impaired ? ?Insight:Lacking ? ? ?Executive Functions  ?Concentration:Fair ? ?Attention Span:Fair ? ?Recall:Good ? ?Fund of Knowledge:Good ? ?Language:Good ? ? ?Psychomotor Activity  ?Psychomotor Activity:No data recorded ? ?Assets  ?Assets:Communication Skills; Desire for Improvement ? ? ?Sleep  ?Sleep:No data recorded ? ? ?Physical Exam: ?Physical Exam ?Vitals reviewed.  ?Constitutional:   ?   Appearance: Normal appearance.  ?HENT:  ?   Head: Normocephalic and atraumatic.  ?   Mouth/Throat:  ?   Pharynx: Oropharynx  is clear.  ?Eyes:  ?   Pupils: Pupils are equal, round, and reactive to light.  ?Cardiovascular:  ?   Rate and Rhythm: Normal rate and regular rhythm.  ?Pulmonary:  ?   Effort: Pulmonary effort is normal.  ?   Breath sounds: Normal breath sounds.  ?Abdominal:  ?   General: Abdomen is flat.  ?   Palpations: Abdomen is soft.  ?Musculoskeletal:     ?   General: Normal range of motion.  ?Skin: ?   General: Skin is warm and dry.  ?Neurological:  ?   General: No focal deficit present.  ?   Mental Status: She is alert. Mental status is at baseline.  ?Psychiatric:     ?   Attention and Perception: Attention normal.     ?   Mood and Affect: Mood normal.     ?   Speech: Speech normal.     ?   Behavior: Behavior normal.     ?   Thought Content: Thought content normal.     ?   Cognition and Memory: Cognition normal.     ?   Judgment: Judgment normal.  ? ?Review of Systems  ?Constitutional: Negative.   ?HENT: Negative.    ?Eyes: Negative.   ?Respiratory:  Positive for shortness of breath.   ?Cardiovascular: Negative.   ?Gastrointestinal: Negative.   ?Musculoskeletal: Negative.   ?Skin: Negative.   ?Neurological: Negative.   ?Psychiatric/Behavioral: Negative.    ?Blood pressure 118/90, pulse (!) 116, temperature 98.3 ?F (36.8 ?C), temperature source Oral, resp. rate 17, height 5\' 6"  (1.676 m), weight 79.4 kg, last menstrual period 06/19/2018, SpO2 95 %. Body mass index is 28.25 kg/m?. ? ? ?Treatment  Plan Summary: ?Medication management and Plan no change to psychiatric medicine.  Medically continues to be short of breath much of the time.  Mostly stays to herself.  Gets out somewhat.  Eating okay.  Mostly treatment team focused on lookin

## 2021-04-01 NOTE — Progress Notes (Signed)
Patient denies pain at time of assessment. Patient denies anxiety and depression. Patient denies SI/HI/VH. ?Patient compliant with medication administration. Patient isolative to room during shift with the exception of coming out for meals and making a phone call. ?Patient later endorses mouth pain. PRN tylenol provided to patient.  ?Q15 minute safety checks maintained. Patient remains safe on the unit at this time. ?

## 2021-04-01 NOTE — Progress Notes (Signed)
Patient compliant with medications denies SI/HI/A/VH and verbally contracted for safety. Ambulates with walker and compliant with fall protocol. Patient remains safe.  Support and encouragement provided. ?

## 2021-04-01 NOTE — Progress Notes (Signed)
Recreation Therapy Notes ?Date: 04/01/2021 ?  ?Time: 10:20 am ?  ?Location: Craft room ?  ?Behavioral response: N/A ?  ?Intervention Topic: Strengths   ?  ?Discussion/Intervention: ?Patient refused to attend group. ?  ?Clinical Observations/Feedback: ?Patient refused to attend group. ?  ?[name] LRT/CTRS ? ? ? ? ? ? ? ? ?Rachel Nolan ?04/01/2021 10:51 AM ?

## 2021-04-02 DIAGNOSIS — F332 Major depressive disorder, recurrent severe without psychotic features: Secondary | ICD-10-CM | POA: Diagnosis not present

## 2021-04-02 NOTE — Plan of Care (Signed)
?  Problem: Education: ?Goal: Knowledge of [facility] Education information/materials will improve ?Outcome: Progressing ?Goal: Emotional status will improve ?Outcome: Progressing ?Goal: Mental status will improve ?Outcome: Progressing ?Goal: Verbalization of understanding the information provided will improve ?Outcome: Progressing ?  ?Problem: Coping: ?Goal: Ability to verbalize frustrations and anger appropriately will improve ?Outcome: Progressing ?  ?Problem: Physical Regulation: ?Goal: Ability to maintain clinical measurements within normal limits will improve ?Outcome: Progressing ?  ?Problem: Education: ?Goal: Knowledge of the prescribed therapeutic regimen will improve ?Outcome: Progressing ?  ?Problem: Coping: ?Goal: Will verbalize feelings ?Outcome: Progressing ?  ?Problem: Health Behavior/Discharge Planning: ?Goal: Compliance with therapeutic regimen will improve ?Outcome: Progressing ?  ?Problem: Safety: ?Goal: Ability to disclose and discuss suicidal ideas will improve ?Outcome: Progressing ?  ?Problem: Self-Concept: ?Goal: Level of anxiety will decrease ?Outcome: Progressing ?  ?

## 2021-04-02 NOTE — Progress Notes (Signed)
?   04/02/21 0100  ?Psych Admission Type (Psych Patients Only)  ?Admission Status Voluntary  ?Psychosocial Assessment  ?Patient Complaints None  ?Eye Contact Fair  ?Facial Expression Sad  ?Affect Depressed  ?Speech Logical/coherent  ?Interaction Isolative  ?Motor Activity Slow  ?Appearance/Hygiene Unremarkable  ?Behavior Characteristics Cooperative;Appropriate to situation  ?Mood Pleasant  ?Thought Process  ?Coherency WDL  ?Content WDL  ?Delusions None reported or observed  ?Perception WDL  ?Hallucination None reported or observed  ?Judgment WDL  ?Confusion None  ?Danger to Self  ?Current suicidal ideation? Denies  ?Danger to Others  ?Danger to Others None reported or observed  ? ?Patient ambulating with walker no falls all fall protocol maintained. Compliant with medications. Denies SI/HI/A/VH and verbally contracted for safety. Support and encouragement ongoing. ?

## 2021-04-02 NOTE — Plan of Care (Signed)
  Problem: Education: Goal: Knowledge of [facility] Education information/materials will improve Outcome: Progressing Goal: Emotional status will improve Outcome: Progressing Goal: Mental status will improve Outcome: Progressing Goal: Verbalization of understanding the information provided will improve Outcome: Progressing   Problem: Activity: Goal: Interest or engagement in activities will improve Outcome: Progressing Goal: Sleeping patterns will improve Outcome: Progressing   Problem: Coping: Goal: Ability to verbalize frustrations and anger appropriately will improve Outcome: Progressing Goal: Ability to demonstrate self-control will improve Outcome: Progressing   Problem: Self-Concept: Goal: Will verbalize positive feelings about self Outcome: Progressing Goal: Level of anxiety will decrease Outcome: Progressing   Problem: Safety: Goal: Ability to disclose and discuss suicidal ideas will improve Outcome: Progressing Goal: Ability to identify and utilize support systems that promote safety will improve Outcome: Progressing   Problem: Role Relationship: Goal: Will demonstrate positive changes in social behaviors and relationships Outcome: Progressing   

## 2021-04-02 NOTE — Group Note (Signed)
LCSW Group Therapy Note ? ?Group Date: 04/02/2021 ?Start Time: 1300 ?End Time: 1400 ? ? ?Type of Therapy and Topic:  Group Therapy - How To Cope with Nervousness about Discharge  ? ?Participation Level:  Did Not Attend  ? ?Description of Group ?This process group involved identification of patients' feelings about discharge. Some of them are scheduled to be discharged soon, while others are new admissions, but each of them was asked to share thoughts and feelings surrounding discharge from the hospital. One common theme was that they are excited at the prospect of going home, while another was that many of them are apprehensive about sharing why they were hospitalized. Patients were given the opportunity to discuss these feelings with their peers in preparation for discharge. ? ?Therapeutic Goals ? ?Patient will identify their overall feelings about pending discharge. ?Patient will think about how they might proactively address issues that they believe will once again arise once they get home (i.e. with parents). ?Patients will participate in discussion about having hope for change. ? ? ?Summary of Patient Progress:   ?X ? ? ?Therapeutic Modalities ?Cognitive Behavioral Therapy ? ? ?Harden Mo, LCSWA ?04/02/2021  1:39 PM   ?

## 2021-04-02 NOTE — Progress Notes (Signed)
[facility] MD Progress Note ? ?04/02/2021 2:08 PM ?Rachel Nolan  ?MRN:  [id number] ?Subjective: Patient seen and chart reviewed.  No new complaint.  Still has no place to live.  Mood dysphoric but not actively suicidal.  Physical complaints stable ?Principal Problem: Depression ?Diagnosis: Principal Problem: ?  Depression ?Active Problems: ?  Post traumatic stress disorder (PTSD) ?  Cocaine use disorder (HCC) ?  Essential hypertension ?  Asthma ?  Nicotine dependence, cigarettes, uncomplicated ? ?Total Time spent with patient: 20 minutes ? ?Past Psychiatric History: Past history of depression and homelessness substance abuse ? ?Past Medical History:  ?Past Medical History:  ?Diagnosis Date  ? Anxiety   ? Asthma   ? COPD (chronic obstructive pulmonary disease) (HCC)   ? Depression   ? Hypertension   ? Scoliosis   ?  ?Past Surgical History:  ?Procedure Laterality Date  ? BACK SURGERY    ? ECTOPIC PREGNANCY SURGERY    ? ?Family History:  ?Family History  ?Problem Relation Age of Onset  ? Hypertension Mother   ? Hypertension Father   ? ?Family Psychiatric  History: See previous ?Social History:  ?Social History  ? ?Substance and Sexual Activity  ?Alcohol Use Yes  ? Comment: one 40 oz per month  ?   ?Social History  ? ?Substance and Sexual Activity  ?Drug Use Yes  ? Types: Marijuana, Cocaine, "Crack" cocaine, Heroin  ?  ?Social History  ? ?Socioeconomic History  ? Marital status: Widowed  ?  Spouse name: Not on file  ? Number of children: Not on file  ? Years of education: Not on file  ? Highest education level: Not on file  ?Occupational History  ? Not on file  ?Tobacco Use  ? Smoking status: Every Day  ?  Packs/day: 1.00  ?  Types: Cigarettes  ? Smokeless tobacco: Never  ?Vaping Use  ? Vaping Use: Never used  ?Substance and Sexual Activity  ? Alcohol use: Yes  ?  Comment: one 40 oz per month  ? Drug use: Yes  ?  Types: Marijuana, Cocaine, "Crack" cocaine, Heroin  ? Sexual activity: Not Currently  ?Other Topics Concern  ? Not  on file  ?Social History Narrative  ? Pt is homeless, no fixed address; not followed by an outpatient psychiatrist  ? ?Social Determinants of Health  ? ?Financial Resource Strain: Not on file  ?Food Insecurity: Not on file  ?Transportation Needs: Not on file  ?Physical Activity: Not on file  ?Stress: Not on file  ?Social Connections: Not on file  ? ?Additional Social History:  ?  ?  ?  ?  ?  ?  ?  ?  ?  ?  ?  ? ?Sleep: Fair ? ?Appetite:  Fair ? ?Current Medications: ?Current Facility-Administered Medications  ?Medication Dose Route Frequency Provider Last Rate Last Admin  ? acetaminophen (TYLENOL) tablet 650 mg  650 mg Oral Q6H PRN Noris Kulinski, Jackquline Denmark, MD   650 mg at 04/02/21 5643  ? albuterol (PROVENTIL) (2.5 MG/3ML) 0.083% nebulizer solution 2.5 mg  2.5 mg Nebulization Q4H PRN Zalmen Wrightsman T, MD      ? albuterol (VENTOLIN HFA) 108 (90 Base) MCG/ACT inhaler 2 puff  2 puff Inhalation Q4H PRN Aryanna Shaver T, MD      ? alum & mag hydroxide-simeth (MAALOX/MYLANTA) 200-200-20 MG/5ML suspension 30 mL  30 mL Oral Q4H PRN Lynton Crescenzo T, MD      ? FLUoxetine (PROZAC) capsule 40 mg  40 mg Oral  q morning Haddon Fyfe, Jackquline Denmark, MD   40 mg at 04/02/21 0935  ? gabapentin (NEURONTIN) capsule 200 mg  200 mg Oral TID Jodel Mayhall T, MD   200 mg at 04/02/21 1157  ? hydrOXYzine (ATARAX) tablet 50 mg  50 mg Oral TID PRN Sentoria Brent, Jackquline Denmark, MD   50 mg at 03/31/21 8676  ? ipratropium-albuterol (DUONEB) 0.5-2.5 (3) MG/3ML nebulizer solution 3 mL  3 mL Nebulization BID Jovanni Eckhart T, MD   3 mL at 04/01/21 2002  ? magnesium hydroxide (MILK OF MAGNESIA) suspension 30 mL  30 mL Oral Daily PRN Jairus Tonne T, MD      ? montelukast (SINGULAIR) tablet 10 mg  10 mg Oral QHS Paxson Harrower T, MD   10 mg at 04/01/21 2135  ? nicotine (NICODERM CQ - dosed in mg/24 hours) patch 21 mg  21 mg Transdermal Daily Lemoyne Scarpati, Jackquline Denmark, MD   21 mg at 04/02/21 0800  ? prazosin (MINIPRESS) capsule 1 mg  1 mg Oral QHS Katheryne Gorr, Jackquline Denmark, MD   1 mg at 04/01/21 2135  ?  QUEtiapine (SEROQUEL) tablet 100 mg  100 mg Oral QHS Darsh Vandevoort, Jackquline Denmark, MD   100 mg at 04/01/21 2135  ? traZODone (DESYREL) tablet 150 mg  150 mg Oral QHS PRN Lorah Kalina, Jackquline Denmark, MD   150 mg at 03/31/21 2132  ? ? ?Lab Results: No results found for this or any previous visit (from the past 48 hour(s)). ? ?Blood Alcohol level:  ?Lab Results  ?Component Value Date  ? ETH <10 03/23/2021  ? ETH <10 09/21/2020  ? ? ?Metabolic Disorder Labs: ?Lab Results  ?Component Value Date  ? HGBA1C 5.6 03/28/2021  ? MPG 114 03/28/2021  ? MPG 111 09/21/2020  ? ?No results found for: PROLACTIN ?Lab Results  ?Component Value Date  ? CHOL 149 03/28/2021  ? TRIG 200 (H) 03/28/2021  ? HDL 52 03/28/2021  ? CHOLHDL 2.9 03/28/2021  ? VLDL 40 03/28/2021  ? LDLCALC 57 03/28/2021  ? LDLCALC 56 09/21/2020  ? ? ?Physical Findings: ?AIMS:  , ,  ,  ,    ?CIWA:    ?COWS:    ? ?Musculoskeletal: ?Strength & Muscle Tone: within normal limits ?Gait & Station: normal ?Patient leans: N/A ? ?Psychiatric Specialty Exam: ? ?Presentation  ?General Appearance: Appropriate for Environment; Fairly Groomed ? ?Eye Contact:Fair ? ?Speech:Clear and Coherent ? ?Speech Volume:Normal ? ?Handedness:Right ? ? ?Mood and Affect  ?Mood:Hopeless; Dysphoric ? ?Affect:Congruent; Depressed ? ? ?Thought Process  ?Thought Processes:Coherent ? ?Descriptions of Associations:Intact ? ?Orientation:Full (Time, Place and Person) ? ?Thought Content:Rumination ? ?History of Schizophrenia/Schizoaffective disorder:No ? ?Duration of Psychotic Symptoms:N/A ? ?Hallucinations:No data recorded ?Ideas of Reference:None ? ?Suicidal Thoughts:No data recorded ?Homicidal Thoughts:No data recorded ? ?Sensorium  ?Memory:Immediate Good; Recent Good; Remote Good ? ?Judgment:Impaired ? ?Insight:Lacking ? ? ?Executive Functions  ?Concentration:Fair ? ?Attention Span:Fair ? ?Recall:Good ? ?Fund of Knowledge:Good ? ?Language:Good ? ? ?Psychomotor Activity  ?Psychomotor Activity:No data recorded ? ?Assets   ?Assets:Communication Skills; Desire for Improvement ? ? ?Sleep  ?Sleep:No data recorded ? ? ?Physical Exam: ?Physical Exam ?Vitals and nursing note reviewed.  ?Constitutional:   ?   Appearance: Normal appearance.  ?HENT:  ?   Head: Normocephalic and atraumatic.  ?   Mouth/Throat:  ?   Pharynx: Oropharynx is clear.  ?Eyes:  ?   Pupils: Pupils are equal, round, and reactive to light.  ?Cardiovascular:  ?   Rate and Rhythm: Normal rate and regular  rhythm.  ?Pulmonary:  ?   Effort: Pulmonary effort is normal.  ?   Breath sounds: Normal breath sounds.  ?Abdominal:  ?   General: Abdomen is flat.  ?   Palpations: Abdomen is soft.  ?Musculoskeletal:     ?   General: Normal range of motion.  ?Skin: ?   General: Skin is warm and dry.  ?Neurological:  ?   General: No focal deficit present.  ?   Mental Status: She is alert. Mental status is at baseline.  ?Psychiatric:     ?   Attention and Perception: Attention normal.     ?   Mood and Affect: Mood normal.     ?   Speech: Speech normal.     ?   Behavior: Behavior normal.     ?   Thought Content: Thought content normal.     ?   Cognition and Memory: Cognition normal.  ? ?Review of Systems  ?Constitutional: Negative.   ?HENT: Negative.    ?Eyes: Negative.   ?Respiratory: Negative.    ?Cardiovascular: Negative.   ?Gastrointestinal: Negative.   ?Musculoskeletal: Negative.   ?Skin: Negative.   ?Neurological: Negative.   ?Psychiatric/Behavioral: Negative.    ?Blood pressure 133/72, pulse (!) 109, temperature 98.4 ?F (36.9 ?C), temperature source Oral, resp. rate 17, height 5\' 6"  (1.676 m), weight 79.4 kg, last menstrual period 06/19/2018, SpO2 99 %. Body mass index is 28.25 kg/m?. ? ? ?Treatment Plan Summary: ?Medication management and Plan no change to medication.  Supportive therapy.  Encouraged patient to please work with us and trying to find some place to live.  We will continue to work with full treatment team on discharge options ? ?Rachel RasmussenJohn Smrithi Pigford, MD ?04/02/2021, 2:08 PM ? ?

## 2021-04-02 NOTE — Progress Notes (Signed)
Patient endorses mouth pain. PRN tylenol provided to patient. Patient denies anxiety and depression. Patient denies SI/HI/AVH. Patient isolative to room with the exception to coming out for meals. Patient compliant with medication administration.  ?Q15 minute safety checks maintained. Patient remains safe on the unit at this time.  ?

## 2021-04-02 NOTE — Progress Notes (Signed)
Recreation Therapy Notes ? ?Date: 04/02/2021 ? ?Time: 10:05 am  ?  ?Location: Craft room   ? ?Behavioral response: N/A ?  ?Intervention Topic: Animal Assisted therapy   ? ?Discussion/Intervention: ?Patient refused to attend group. ?  ?Clinical Observations/Feedback:  ?Patient refused to attend group. ?  ?Michaelangelo Mittelman LRT/CTRS ? ? ? ? ? ? ? ? ?Pami Wool ?04/02/2021 12:16 PM ?

## 2021-04-02 NOTE — [facility] Group Notes (Signed)
Group Note - ?Psychoeducational group -  ?Patients were read a poem '' The owl and the Chimpanzee '' by Psychologist Jacquelyne Balint- patients were then asked to identify times in their life when they over reacted, or behaved in a way that was not in best interest for mental health. Patients were asked to identify how this possibly triggered hospitalization. - Pt did not attend.  ?

## 2021-04-03 ENCOUNTER — Inpatient Hospital Stay: Payer: Self-pay | Admitting: Nurse Practitioner

## 2021-04-03 DIAGNOSIS — F332 Major depressive disorder, recurrent severe without psychotic features: Secondary | ICD-10-CM | POA: Diagnosis not present

## 2021-04-03 NOTE — Plan of Care (Signed)
D: Pt alert and oriented. Pt rates depression 10/10, hopelessness 0/10, and anxiety 0/10. Pt reports energy level as normal and concentration as being good. Pt reports sleep last night as being so so. Pt did receive medications for sleep and did find them helpful. Pt reports experiencing lower back pain at this time, pt does not request any prn medications. Pt denies experiencing any SI/HI, or AVH at this time.  ? ?A: Scheduled medications administered to pt, per MD orders. Support and encouragement provided. Frequent verbal contact made. Routine safety checks conducted q15 minutes.  ? ?R: No adverse drug reactions noted. Pt verbally contracts for safety at this time. Pt compliant with medications. Pt interacts minimally with others on the unit, mostly self isolating to room with exception to meals. Pt remains safe at this time. Will continue to monitor.  ? ?Problem: Education: ?Goal: Emotional status will improve ?Outcome: Not Progressing ?  ?Problem: Activity: ?Goal: Interest or engagement in activities will improve ?Outcome: Not Progressing ?  ?

## 2021-04-03 NOTE — Progress Notes (Signed)
Recreation Therapy Notes ? ?Date: 04/03/2021 ? ?Time: 10:45 am ? ?Location: Courtyard  ? ?Behavioral response: N/A ? ?Intervention Topic: Leisure   ? ?Discussion/Intervention: ?Patient refused to attend group. ? ?Clinical Observations/Feedback: ?Patient refused to attend group. ? ?Sherylann Vangorden LRT/CTRS ? ? ? ? ? ? ? ?Rachel Nolan ?04/03/2021 12:48 PM ?

## 2021-04-03 NOTE — Progress Notes (Signed)
?   04/03/21 1400  ?Clinical Encounter Type  ?Visited With Patient  ?Visit Type Initial  ?Referral From Nurse  ?Consult/Referral To Chaplain  ? ?Chaplain responded to nurse consult. Patient was engaging and spoke with Chaplain about challenges outside of the hospital. Chaplain explored different options for staying safe and healthy. Patient appreciated [place] visit. ?

## 2021-04-03 NOTE — Progress Notes (Signed)
Patient is pleasant an cooperative. Has been isolative to her room except for meals.  Patient received breathing treatment this evening without incident.  Patient reports breathing is better although she continues with labored breath. She is med compliant and received QHS meds without incident. She denies si hi avh anxiety and depression at this encounter.  Endorses some overall generalized pain, but did not request any medication to help with symptom relief.  Will continue to monitor with q 15 minute safety checks.  Encouraged her to seek staff if she has any needs or concerns.  ? ? ? [name], LPN ?

## 2021-04-03 NOTE — Group Note (Signed)
[facility] LCSW Group Therapy Note ? ? ?Group Date: 04/03/2021 ?Start Time: 1300 ?End Time: 1400 ? ? ?Type of Therapy/Topic:  Group Therapy:  Emotion Regulation ? ?Participation Level:  Did Not Attend  ? ?Mood: ? ?Description of Group:   ? The purpose of this group is to assist patients in learning to regulate negative emotions and experience positive emotions. Patients will be guided to discuss ways in which they have been vulnerable to their negative emotions. These vulnerabilities will be juxtaposed with experiences of positive emotions or situations, and patients challenged to use positive emotions to combat negative ones. Special emphasis will be placed on coping with negative emotions in conflict situations, and patients will process healthy conflict resolution skills. ? ?Therapeutic Goals: ?Patient will identify two positive emotions or experiences to reflect on in order to balance out negative emotions:  ?Patient will label two or more emotions that they find the most difficult to experience:  ?Patient will be able to demonstrate positive conflict resolution skills through discussion or role plays:  ? ?Summary of Patient Progress: ?X ? ? ?Therapeutic Modalities:   ?Cognitive Behavioral Therapy ?Feelings Identification ?Dialectical Behavioral Therapy ? ? ?Vinnie Gombert J Katherene Dinino, LCSW ?

## 2021-04-03 NOTE — Progress Notes (Signed)
[facility] ? ?04/03/2021 10:58 AM ?Rachel LandrySandra Nolan  ?MRN:  [id number] ?Subjective: Follow-up for this patient with depression and substance abuse.  Patient says she is feeling down entirely about her situation.  By "her situation" she means being homeless with very little support.  Patient does skip to eat but otherwise spends a lot of time in her room not doing very much.  No active suicidal intent or plan. ?Principal Problem: Depression ?Diagnosis: Principal Problem: ?  Depression ?Active Problems: ?  Post traumatic stress disorder (PTSD) ?  Cocaine use disorder (HCC) ?  Essential hypertension ?  Asthma ?  Nicotine dependence, cigarettes, uncomplicated ? ?Total Time spent with patient: 30 minutes ? ?Past Psychiatric History: Past history of recurrent depression and chronic substance abuse ? ?Past Medical History:  ?Past Medical History:  ?Diagnosis Date  ? Anxiety   ? Asthma   ? COPD (chronic obstructive pulmonary disease) (HCC)   ? Depression   ? Hypertension   ? Scoliosis   ?  ?Past Surgical History:  ?Procedure Laterality Date  ? BACK SURGERY    ? ECTOPIC PREGNANCY SURGERY    ? ?Family History:  ?Family History  ?Problem Relation Age of Onset  ? Hypertension Mother   ? Hypertension Father   ? ?Family Psychiatric  History: See previous.  Positive for substance abuse in immediate family ?Social History:  ?Social History  ? ?Substance and Sexual Activity  ?Alcohol Use Yes  ? Comment: one 40 oz per month  ?   ?Social History  ? ?Substance and Sexual Activity  ?Drug Use Yes  ? Types: Marijuana, Cocaine, "Crack" cocaine, Heroin  ?  ?Social History  ? ?Socioeconomic History  ? Marital status: Widowed  ?  Spouse name: Not on file  ? Number of children: Not on file  ? Years of education: Not on file  ? Highest education level: Not on file  ?Occupational History  ? Not on file  ?Tobacco Use  ? Smoking status: Every Day  ?  Packs/day: 1.00  ?  Types: Cigarettes  ? Smokeless tobacco: Never  ?Vaping Use  ? Vaping  Use: Never used  ?Substance and Sexual Activity  ? Alcohol use: Yes  ?  Comment: one 40 oz per month  ? Drug use: Yes  ?  Types: Marijuana, Cocaine, "Crack" cocaine, Heroin  ? Sexual activity: Not Currently  ?Other Topics Concern  ? Not on file  ?Social History Narrative  ? Pt is homeless, no fixed address; not followed by an outpatient psychiatrist  ? ?Social Determinants of Health  ? ?Financial Resource Strain: Not on file  ?Food Insecurity: Not on file  ?Transportation Needs: Not on file  ?Physical Activity: Not on file  ?Stress: Not on file  ?Social Connections: Not on file  ? ?Additional Social History:  ?  ?  ?  ?  ?  ?  ?  ?  ?  ?  ?  ? ?Sleep: Fair ? ?Appetite:  Fair ? ?Current Medications: ?Current Facility-Administered Medications  ?Medication Dose Route Frequency Provider Last Rate Last Admin  ? acetaminophen (TYLENOL) tablet 650 mg  650 mg Oral Q6H PRN Brittnie Lewey, Jackquline DenmarkJohn T, MD   650 mg at 04/02/21 1645  ? albuterol (PROVENTIL) (2.5 MG/3ML) 0.083% nebulizer solution 2.5 mg  2.5 mg Nebulization Q4H PRN Dessiree Sze T, MD      ? albuterol (VENTOLIN HFA) 108 (90 Base) MCG/ACT inhaler 2 puff  2 puff Inhalation Q4H PRN Nyiesha Beever, Jackquline DenmarkJohn T, MD      ?  alum & mag hydroxide-simeth (MAALOX/MYLANTA) 200-200-20 MG/5ML suspension 30 mL  30 mL Oral Q4H PRN Cadee Agro T, MD      ? FLUoxetine (PROZAC) capsule 40 mg  40 mg Oral q morning Trude Cansler, Jackquline Denmark, MD   40 mg at 04/03/21 8299  ? gabapentin (NEURONTIN) capsule 200 mg  200 mg Oral TID Tykwon Fera, Jackquline Denmark, MD   200 mg at 04/03/21 3716  ? hydrOXYzine (ATARAX) tablet 50 mg  50 mg Oral TID PRN Margo Lama, Jackquline Denmark, MD   50 mg at 03/31/21 9678  ? ipratropium-albuterol (DUONEB) 0.5-2.5 (3) MG/3ML nebulizer solution 3 mL  3 mL Nebulization BID Santhosh Gulino T, MD   3 mL at 04/03/21 0700  ? magnesium hydroxide (MILK OF MAGNESIA) suspension 30 mL  30 mL Oral Daily PRN Tomasina Keasling T, MD      ? montelukast (SINGULAIR) tablet 10 mg  10 mg Oral QHS Coe Angelos T, MD   10 mg at 04/02/21 2115   ? nicotine (NICODERM CQ - dosed in mg/24 hours) patch 21 mg  21 mg Transdermal Daily Deyonna Fitzsimmons, Jackquline Denmark, MD   21 mg at 04/03/21 0804  ? prazosin (MINIPRESS) capsule 1 mg  1 mg Oral QHS Loreena Valeri, Jackquline Denmark, MD   1 mg at 04/02/21 2115  ? QUEtiapine (SEROQUEL) tablet 100 mg  100 mg Oral QHS Sylvester Minton T, MD   100 mg at 04/02/21 2115  ? traZODone (DESYREL) tablet 150 mg  150 mg Oral QHS PRN Cindi Ghazarian, Jackquline Denmark, MD   150 mg at 04/02/21 2115  ? ? ?Lab Results: No results found for this or any previous visit (from the past 48 hour(s)). ? ?Blood Alcohol level:  ?Lab Results  ?Component Value Date  ? ETH <10 03/23/2021  ? ETH <10 09/21/2020  ? ? ?Metabolic Disorder Labs: ?Lab Results  ?Component Value Date  ? HGBA1C 5.6 03/28/2021  ? MPG 114 03/28/2021  ? MPG 111 09/21/2020  ? ?No results found for: PROLACTIN ?Lab Results  ?Component Value Date  ? CHOL 149 03/28/2021  ? TRIG 200 (H) 03/28/2021  ? HDL 52 03/28/2021  ? CHOLHDL 2.9 03/28/2021  ? VLDL 40 03/28/2021  ? LDLCALC 57 03/28/2021  ? LDLCALC 56 09/21/2020  ? ? ?Physical Findings: ?AIMS:  , ,  ,  ,    ?CIWA:    ?COWS:    ? ?Musculoskeletal: ?Strength & Muscle Tone: within normal limits ?Gait & Station: normal ?Patient leans: N/A ? ?Psychiatric Specialty Exam: ? ?Presentation  ?General Appearance: Appropriate for Environment; Fairly Groomed ? ?Eye Contact:Fair ? ?Speech:Clear and Coherent ? ?Speech Volume:Normal ? ?Handedness:Right ? ? ?Mood and Affect  ?Mood:Hopeless; Dysphoric ? ?Affect:Congruent; Depressed ? ? ?Thought Process  ?Thought Processes:Coherent ? ?Descriptions of Associations:Intact ? ?Orientation:Full (Time, Place and Person) ? ?Thought Content:Rumination ? ?History of Schizophrenia/Schizoaffective disorder:No ? ?Duration of Psychotic Symptoms:N/A ? ?Hallucinations:No data recorded ?Ideas of Reference:None ? ?Suicidal Thoughts:No data recorded ?Homicidal Thoughts:No data recorded ? ?Sensorium  ?Memory:Immediate Good; Recent Good; Remote  Good ? ?Judgment:Impaired ? ?Insight:Lacking ? ? ?Executive Functions  ?Concentration:Fair ? ?Attention Span:Fair ? ?Recall:Good ? ?Fund of Knowledge:Good ? ?Language:Good ? ? ?Psychomotor Activity  ?Psychomotor Activity:No data recorded ? ?Assets  ?Assets:Communication Skills; Desire for Improvement ? ? ?Sleep  ?Sleep:No data recorded ? ? ?Physical Exam: ?Physical Exam ?Vitals and nursing note reviewed.  ?Constitutional:   ?   Appearance: Normal appearance.  ?HENT:  ?   Head: Normocephalic and atraumatic.  ?   Mouth/Throat:  ?  Pharynx: Oropharynx is clear.  ?Eyes:  ?   Pupils: Pupils are equal, round, and reactive to light.  ?Cardiovascular:  ?   Rate and Rhythm: Normal rate and regular rhythm.  ?Pulmonary:  ?   Effort: Pulmonary effort is normal.  ?   Breath sounds: Normal breath sounds.  ?Abdominal:  ?   General: Abdomen is flat.  ?   Palpations: Abdomen is soft.  ?Musculoskeletal:     ?   General: Normal range of motion.  ?Skin: ?   General: Skin is warm and dry.  ?Neurological:  ?   General: No focal deficit present.  ?   Mental Status: She is alert. Mental status is at baseline.  ?Psychiatric:     ?   Attention and Perception: Attention normal.     ?   Mood and Affect: Mood normal. Affect is blunt.     ?   Speech: Speech normal.     ?   Behavior: Behavior normal.     ?   Thought Content: Thought content normal.     ?   Cognition and Memory: Cognition normal.     ?   Judgment: Judgment normal.  ? ?Review of Systems  ?Constitutional: Negative.   ?HENT: Negative.    ?Eyes: Negative.   ?Respiratory: Negative.    ?Cardiovascular: Negative.   ?Gastrointestinal: Negative.   ?Musculoskeletal: Negative.   ?Skin: Negative.   ?Neurological: Negative.   ?Psychiatric/Behavioral:  Positive for depression. Negative for hallucinations, memory loss, substance abuse and suicidal ideas. The patient is nervous/anxious. The patient does not have insomnia.   ?Blood pressure 119/85, pulse 100, temperature 98.8 ?F (37.1 ?C),  temperature source Oral, resp. rate 18, height 5\' 6"  (1.676 m), weight 79.4 kg, last menstrual period 06/19/2018, SpO2 97 %. Body mass index is 28.25 kg/m?. ? ? ?Treatment Plan Summary: ?Medication management and Plan patient has a 08/19/2018

## 2021-04-04 DIAGNOSIS — F332 Major depressive disorder, recurrent severe without psychotic features: Secondary | ICD-10-CM | POA: Diagnosis not present

## 2021-04-04 MED ORDER — AMOXICILLIN-POT CLAVULANATE 875-125 MG PO TABS
1.0000 | ORAL_TABLET | Freq: Two times a day (BID) | ORAL | Status: DC
  Administered 2021-04-04 – 2021-04-08 (×9): 1 via ORAL
  Filled 2021-04-04 (×10): qty 1

## 2021-04-04 MED ORDER — BENZOCAINE 10 % MT GEL
Freq: Four times a day (QID) | OROMUCOSAL | Status: DC | PRN
  Administered 2021-04-04: 1 via OROMUCOSAL
  Filled 2021-04-04: qty 9

## 2021-04-04 NOTE — Progress Notes (Addendum)
D: Patient alert and oriented, able to make needs known. Denies SI/HI, AVH at present. Denies pain at present. Patient reports energy level as within her normal. She reports she did not sleep well last night, patient encouraged to inform staff when she needs assistance with temperature in room. Patient does not request any PRN medication at this time. She received PRN Tylenol this morning for tooth pain/mouth pain and it has not been relieved. She originally reported her pain at a 10/10 and it is currently still a 9/10. MD has been notified that patient requested orajel for the tooth pain. ? ?A: Scheduled medications administered to patient per MD order. Support and encouragement provided. Routine safety checks conducted every fifteen minutes. Patient informed to notify staff with problems or concerns. Frequent verbal contact made.  ? ?R: No adverse drug reactions noted. Patient contracts for safety at this time. Patient is compliant with medications and treatment plan. Patient receptive, calm and cooperative. Patient interacts with others appropriately on unit at present. Patient remains safe at present.  ?

## 2021-04-04 NOTE — Progress Notes (Signed)
Patient verbalizes relief after using oragel. She ate lunch and is resting in bed, napping.  ?

## 2021-04-04 NOTE — Progress Notes (Signed)
Patient did not have a good night sleep. Awoke several times during the night. May have been due to temperature in her room as she got up in the middle of the night and opened the door to her room.  Declined the need to change the temperature. No other issues to note. Will continue to monitor with q 15 minute safety checks. Support and encouragement offered. ? ? ?C Butler-Nicholson, LPN ?

## 2021-04-04 NOTE — Group Note (Signed)
[facility] LCSW Group Therapy Note ? ? ?Group Date: 04/04/2021 ?Start Time: 1320 ?End Time: 1420 ? ? ?Type of Therapy and Topic: Group Therapy: Avoiding Self-Sabotaging and Enabling Behaviors ? ?Participation Level: Did Not Attend ? ?Mood: ? ?Description of Group:  ?In this group, patients will learn how to identify obstacles, self-sabotaging and enabling behaviors, as well as: what are they, why do we do them and what needs these behaviors meet. Discuss unhealthy relationships and how to have positive healthy boundaries with those that sabotage and enable. Explore aspects of self-sabotage and enabling in yourself and how to limit these self-destructive behaviors in everyday life. ? ? ?Therapeutic Goals: ?1. Patient will identify one obstacle that relates to self-sabotage and enabling behaviors ?2. Patient will identify one personal self-sabotaging or enabling behavior they did prior to admission ?3. Patient will state a plan to change the above identified behavior ?4. Patient will demonstrate ability to communicate their needs through discussion and/or role play.  ? ? ?Summary of Patient Progress: Patient did not attend group despite encouraged participation. ? ? ? ? ? ?Therapeutic Modalities:  ?Cognitive Behavioral Therapy ?Person-Centered Therapy ?Motivational Interviewing ? ? ? ?Erleen Egner K Derwin Reddy, LCSWA ?

## 2021-04-04 NOTE — Plan of Care (Incomplete)
?  Problem: Education: Goal: Knowledge of [facility] Education information/materials will improve Outcome: Progressing Goal: Emotional status will improve Outcome: Progressing Goal: Mental status will improve Outcome: Progressing   Problem: Coping: Goal: Ability to verbalize frustrations and anger appropriately will improve Outcome: Progressing Goal: Ability to demonstrate self-control will improve Outcome: Progressing   

## 2021-04-04 NOTE — BH IP Treatment Plan (Signed)
Interdisciplinary Treatment and Diagnostic Plan Update ? ?04/04/2021 ?Time of Session: 10:00AM ?Richardson LandrySandra Reinitz ?MRN: [id number] ? ?Principal Diagnosis: Depression ? ?Secondary Diagnoses: Principal Problem: ?  Depression ?Active Problems: ?  Post traumatic stress disorder (PTSD) ?  Cocaine use disorder (HCC) ?  Essential hypertension ?  Asthma ?  Nicotine dependence, cigarettes, uncomplicated ? ? ?Current Medications:  ?Current Facility-Administered Medications  ?Medication Dose Route Frequency Provider Last Rate Last Admin  ? acetaminophen (TYLENOL) tablet 650 mg  650 mg Oral Q6H PRN Clapacs, Jackquline DenmarkJohn T, MD   650 mg at 04/04/21 0751  ? albuterol (PROVENTIL) (2.5 MG/3ML) 0.083% nebulizer solution 2.5 mg  2.5 mg Nebulization Q4H PRN Clapacs, John T, MD      ? albuterol (VENTOLIN HFA) 108 (90 Base) MCG/ACT inhaler 2 puff  2 puff Inhalation Q4H PRN Clapacs, Jackquline DenmarkJohn T, MD      ? alum & mag hydroxide-simeth (MAALOX/MYLANTA) 200-200-20 MG/5ML suspension 30 mL  30 mL Oral Q4H PRN Clapacs, Jackquline DenmarkJohn T, MD      ? amoxicillin-clavulanate (AUGMENTIN) 875-125 MG per tablet 1 tablet  1 tablet Oral Q12H He, Jun, MD      ? benzocaine (ORAJEL) 10 % mucosal gel   Mouth/Throat QID PRN He, Jun, MD      ? FLUoxetine (PROZAC) capsule 40 mg  40 mg Oral q morning Clapacs, Jackquline DenmarkJohn T, MD   40 mg at 04/04/21 0947  ? gabapentin (NEURONTIN) capsule 200 mg  200 mg Oral TID Clapacs, Jackquline DenmarkJohn T, MD   200 mg at 04/04/21 0749  ? hydrOXYzine (ATARAX) tablet 50 mg  50 mg Oral TID PRN Clapacs, Jackquline DenmarkJohn T, MD   50 mg at 03/31/21 [id number]  ? ipratropium-albuterol (DUONEB) 0.5-2.5 (3) MG/3ML nebulizer solution 3 mL  3 mL Nebulization BID Clapacs, John T, MD   3 mL at 04/04/21 0741  ? magnesium hydroxide (MILK OF MAGNESIA) suspension 30 mL  30 mL Oral Daily PRN Clapacs, John T, MD      ? montelukast (SINGULAIR) tablet 10 mg  10 mg Oral QHS Clapacs, John T, MD   10 mg at 04/03/21 2105  ? nicotine (NICODERM CQ - dosed in mg/24 hours) patch 21 mg  21 mg Transdermal Daily Clapacs, Jackquline DenmarkJohn  T, MD   21 mg at 04/03/21 0804  ? prazosin (MINIPRESS) capsule 1 mg  1 mg Oral QHS Clapacs, Jackquline DenmarkJohn T, MD   1 mg at 04/03/21 2105  ? QUEtiapine (SEROQUEL) tablet 100 mg  100 mg Oral QHS Clapacs, Jackquline DenmarkJohn T, MD   100 mg at 04/03/21 2105  ? traZODone (DESYREL) tablet 150 mg  150 mg Oral QHS PRN Clapacs, Jackquline DenmarkJohn T, MD   150 mg at 04/03/21 2105  ? ?PTA Medications: ?Medications Prior to Admission  ?Medication Sig Dispense Refill Last Dose  ? albuterol (PROVENTIL) (2.5 MG/3ML) 0.083% nebulizer solution Take 3 mLs (2.5 mg total) by nebulization every 4 (four) hours as needed for wheezing or shortness of breath. 75 mL 0   ? albuterol (VENTOLIN HFA) 108 (90 Base) MCG/ACT inhaler Inhale 2 puffs into the lungs every 6 (six) hours as needed. (Patient taking differently: Inhale 2 puffs into the lungs every 6 (six) hours as needed for shortness of breath or wheezing.) 8.5 g 0   ? amLODipine (NORVASC) 10 MG tablet Take 1 tablet (10 mg total) by mouth daily. 30 tablet 0   ? doxycycline (VIBRA-TABS) 100 MG tablet Take 1 tablet (100 mg total) by mouth every 12 (twelve) hours. (Patient not taking: Reported  on 03/24/2021) 10 tablet 0   ? FLUoxetine (PROZAC) 40 MG capsule Take 1 capsule (40 mg total) by mouth every morning. 30 capsule 0   ? folic acid (FOLVITE) 1 MG tablet Take 1 tablet (1 mg total) by mouth daily.     ? ibuprofen (ADVIL) 200 MG tablet Take 200 mg by mouth every 6 (six) hours as needed for headache (pain).     ? montelukast (SINGULAIR) 10 MG tablet Take 1 tablet (10 mg total) by mouth at bedtime. 30 tablet 0   ? nicotine (NICODERM CQ - DOSED IN MG/24 HOURS) 14 mg/24hr patch Place 1 patch (14 mg total) onto the skin daily. (Patient not taking: Reported on 02/23/2021) 28 patch 0   ? NON FORMULARY CPAP at bedtime     ? OXYGEN Inhale 2 L into the lungs continuous.     ? prazosin (MINIPRESS) 1 MG capsule Take 3 capsules (3 mg total) by mouth at bedtime. 90 capsule 0   ? predniSONE (STERAPRED UNI-PAK 21 TAB) 10 MG (21) TBPK tablet Use per  pack instruction (Patient not taking: Reported on 03/24/2021) 21 tablet 0   ? QUEtiapine (SEROQUEL) 200 MG tablet Take 1 tablet (200 mg total) by mouth at bedtime. 30 tablet 0   ? ? ?Patient Stressors: Financial difficulties   ?Loss of nephew (2 years ago)   ?Substance abuse   ?Other: Homelessness   ? ?Patient Strengths: Ability for insight  ?Motivation for treatment/growth  ?Supportive family/friends  ? ?Treatment Modalities: Medication Management, Group therapy, Case management,  ?1 to 1 session with clinician, Psychoeducation, Recreational therapy. ? ? ?Physician Treatment Plan for Primary Diagnosis: Depression ?Long Term Goal(s): Improvement in symptoms so as ready for discharge  ? ?Short Term Goals: Ability to maintain clinical measurements within normal limits will improve ?Ability to identify triggers associated with substance abuse/mental health issues will improve ?Ability to disclose and discuss suicidal ideas ?Ability to demonstrate self-control will improve ? ?Medication Management: Evaluate patient's response, side effects, and tolerance of medication regimen. ? ?Therapeutic Interventions: 1 to 1 sessions, Unit Group sessions and Medication administration. ? ?Evaluation of Outcomes: Progressing ? ?Physician Treatment Plan for Secondary Diagnosis: Principal Problem: ?  Depression ?Active Problems: ?  Post traumatic stress disorder (PTSD) ?  Cocaine use disorder (HCC) ?  Essential hypertension ?  Asthma ?  Nicotine dependence, cigarettes, uncomplicated ? ?Long Term Goal(s): Improvement in symptoms so as ready for discharge  ? ?Short Term Goals: Ability to maintain clinical measurements within normal limits will improve ?Ability to identify triggers associated with substance abuse/mental health issues will improve ?Ability to disclose and discuss suicidal ideas ?Ability to demonstrate self-control will improve    ? ?Medication Management: Evaluate patient's response, side effects, and tolerance of medication  regimen. ? ?Therapeutic Interventions: 1 to 1 sessions, Unit Group sessions and Medication administration. ? ?Evaluation of Outcomes: Progressing ? ? ?RN Treatment Plan for Primary Diagnosis: Depression ?Long Term Goal(s): Knowledge of disease and therapeutic regimen to maintain health will improve ? ?Short Term Goals: Ability to remain free from injury will improve, Ability to participate in decision making will improve, Ability to verbalize feelings will improve, Ability to disclose and discuss suicidal ideas, Ability to identify and develop effective coping behaviors will improve, and Compliance with prescribed medications will improve ? ?Medication Management: RN will administer medications as ordered by provider, will assess and evaluate patient's response and provide education to patient for prescribed medication. RN will report any adverse and/or side effects to  prescribing provider. ? ?Therapeutic Interventions: 1 on 1 counseling sessions, Psychoeducation, Medication administration, Evaluate responses to treatment, Monitor vital signs and CBGs as ordered, Perform/monitor CIWA, COWS, AIMS and Fall Risk screenings as ordered, Perform wound care treatments as ordered. ? ?Evaluation of Outcomes: Progressing ? ? ?LCSW Treatment Plan for Primary Diagnosis: Depression ?Long Term Goal(s): Safe transition to appropriate next level of care at discharge, Engage patient in therapeutic group addressing interpersonal concerns. ? ?Short Term Goals: Engage patient in aftercare planning with referrals and resources, Increase social support, Increase ability to appropriately verbalize feelings, Increase emotional regulation, Facilitate acceptance of mental health diagnosis and concerns, Facilitate patient progression through stages of change regarding substance use diagnoses and concerns, Identify triggers associated with mental health/substance abuse issues, and Increase skills for wellness and recovery ? ?Therapeutic  Interventions: Assess for all discharge needs, 1 to 1 time with Social worker, Explore available resources and support systems, Assess for adequacy in community support network, Educate family and signific

## 2021-04-04 NOTE — Plan of Care (Signed)
?  Problem: Education: ?Goal: Knowledge of [facility] Education information/materials will improve ?Outcome: Progressing ?  ?Problem: Education: ?Goal: Emotional status will improve ?Outcome: Progressing ?  ?Problem: Education: ?Goal: Mental status will improve ?Outcome: Progressing ?  ?Problem: Coping: ?Goal: Ability to verbalize frustrations and anger appropriately will improve ?Outcome: Progressing ?  ?Problem: Coping: ?Goal: Ability to demonstrate self-control will improve ?Outcome: Progressing ?  ?

## 2021-04-04 NOTE — Progress Notes (Signed)
[facility] MD Progress Note ? ?04/04/2021 1:20 PM ?Rachel Nolan  ?MRN:  [id number] ?Subjective: pt seen and chart reviewed.  ?She complained 10/10 toothache, and agreed to start on an ABX with oraljel, given that we don't have dental service here over the weekend.  ? ?Otherwise, she reports fairl mood and denied SI. She tolerates meds without complaints.  She is motivated to sustain sobriety.  ? ?Principal Problem: Depression ?Diagnosis: Principal Problem: ?  Depression ?Active Problems: ?  Post traumatic stress disorder (PTSD) ?  Cocaine use disorder (HCC) ?  Essential hypertension ?  Asthma ?  Nicotine dependence, cigarettes, uncomplicated ? ?Total Time spent with patient: 20 minutes ? ?Past Psychiatric History: Past history of recurrent depression and chronic substance abuse ? ?Past Medical History:  ?Past Medical History:  ?Diagnosis Date  ? Anxiety   ? Asthma   ? COPD (chronic obstructive pulmonary disease) (HCC)   ? Depression   ? Hypertension   ? Scoliosis   ?  ?Past Surgical History:  ?Procedure Laterality Date  ? BACK SURGERY    ? ECTOPIC PREGNANCY SURGERY    ? ?Family History:  ?Family History  ?Problem Relation Age of Onset  ? Hypertension Mother   ? Hypertension Father   ? ?Family Psychiatric  History: See previous.  Positive for substance abuse in immediate family ?Social History:  ?Social History  ? ?Substance and Sexual Activity  ?Alcohol Use Yes  ? Comment: one 40 oz per month  ?   ?Social History  ? ?Substance and Sexual Activity  ?Drug Use Yes  ? Types: Marijuana, Cocaine, "Crack" cocaine, Heroin  ?  ?Social History  ? ?Socioeconomic History  ? Marital status: Widowed  ?  Spouse name: Not on file  ? Number of children: Not on file  ? Years of education: Not on file  ? Highest education level: Not on file  ?Occupational History  ? Not on file  ?Tobacco Use  ? Smoking status: Every Day  ?  Packs/day: 1.00  ?  Types: Cigarettes  ? Smokeless tobacco: Never  ?Vaping Use  ? Vaping Use: Never used  ?Substance  and Sexual Activity  ? Alcohol use: Yes  ?  Comment: one 40 oz per month  ? Drug use: Yes  ?  Types: Marijuana, Cocaine, "Crack" cocaine, Heroin  ? Sexual activity: Not Currently  ?Other Topics Concern  ? Not on file  ?Social History Narrative  ? Pt is homeless, no fixed address; not followed by an outpatient psychiatrist  ? ?Social Determinants of Health  ? ?Financial Resource Strain: Not on file  ?Food Insecurity: Not on file  ?Transportation Needs: Not on file  ?Physical Activity: Not on file  ?Stress: Not on file  ?Social Connections: Not on file  ? ?Additional Social History:  ? ? ?Sleep: Fair ? ?Appetite:  Fair ? ?Current Medications: ?Current Facility-Administered Medications  ?Medication Dose Route Frequency Provider Last Rate Last Admin  ? acetaminophen (TYLENOL) tablet 650 mg  650 mg Oral Q6H PRN Clapacs, Jackquline Denmark, MD   650 mg at 04/04/21 0751  ? albuterol (PROVENTIL) (2.5 MG/3ML) 0.083% nebulizer solution 2.5 mg  2.5 mg Nebulization Q4H PRN Clapacs, John T, MD      ? albuterol (VENTOLIN HFA) 108 (90 Base) MCG/ACT inhaler 2 puff  2 puff Inhalation Q4H PRN Clapacs, John T, MD      ? alum & mag hydroxide-simeth (MAALOX/MYLANTA) 200-200-20 MG/5ML suspension 30 mL  30 mL Oral Q4H PRN Clapacs, Jackquline Denmark, MD      ?  amoxicillin-clavulanate (AUGMENTIN) 875-125 MG per tablet 1 tablet  1 tablet Oral Q12H Ralonda Tartt, MD   1 tablet at 04/04/21 1116  ? benzocaine (ORAJEL) 10 % mucosal gel   Mouth/Throat QID PRN Katherin Ramey, MD   1 application. at 04/04/21 1117  ? FLUoxetine (PROZAC) capsule 40 mg  40 mg Oral q morning Clapacs, Jackquline DenmarkJohn T, MD   40 mg at 04/04/21 0947  ? gabapentin (NEURONTIN) capsule 200 mg  200 mg Oral TID Clapacs, John T, MD   200 mg at 04/04/21 1109  ? hydrOXYzine (ATARAX) tablet 50 mg  50 mg Oral TID PRN Clapacs, Jackquline DenmarkJohn T, MD   50 mg at 03/31/21 [id number]  ? ipratropium-albuterol (DUONEB) 0.5-2.5 (3) MG/3ML nebulizer solution 3 mL  3 mL Nebulization BID Clapacs, John T, MD   3 mL at 04/04/21 0741  ? magnesium hydroxide  (MILK OF MAGNESIA) suspension 30 mL  30 mL Oral Daily PRN Clapacs, John T, MD      ? montelukast (SINGULAIR) tablet 10 mg  10 mg Oral QHS Clapacs, John T, MD   10 mg at 04/03/21 2105  ? nicotine (NICODERM CQ - dosed in mg/24 hours) patch 21 mg  21 mg Transdermal Daily Clapacs, Jackquline DenmarkJohn T, MD   21 mg at 04/03/21 0804  ? prazosin (MINIPRESS) capsule 1 mg  1 mg Oral QHS Clapacs, Jackquline DenmarkJohn T, MD   1 mg at 04/03/21 2105  ? QUEtiapine (SEROQUEL) tablet 100 mg  100 mg Oral QHS Clapacs, Jackquline DenmarkJohn T, MD   100 mg at 04/03/21 2105  ? traZODone (DESYREL) tablet 150 mg  150 mg Oral QHS PRN Clapacs, Jackquline DenmarkJohn T, MD   150 mg at 04/03/21 2105  ? ? ?Lab Results: No results found for this or any previous visit (from the past 48 hour(s)). ? ?Blood Alcohol level:  ?Lab Results  ?Component Value Date  ? ETH <10 03/23/2021  ? ETH <10 09/21/2020  ? ? ?Metabolic Disorder Labs: ?Lab Results  ?Component Value Date  ? HGBA1C 5.6 03/28/2021  ? MPG 114 03/28/2021  ? MPG 111 09/21/2020  ? ?No results found for: PROLACTIN ?Lab Results  ?Component Value Date  ? CHOL 149 03/28/2021  ? TRIG 200 (H) 03/28/2021  ? HDL 52 03/28/2021  ? CHOLHDL 2.9 03/28/2021  ? VLDL 40 03/28/2021  ? LDLCALC 57 03/28/2021  ? LDLCALC 56 09/21/2020  ? ? ?Physical Findings: ?AIMS:  , ,  ,  ,    ?CIWA:    ?COWS:    ? ?Musculoskeletal: ?Strength & Muscle Tone: within normal limits ?Gait & Station: normal ?Patient leans: N/A ? ?Psychiatric Specialty Exam: ? ?Presentation  ?General Appearance: Appropriate for Environment; Fairly Groomed ? ?Eye Contact:Fair ? ?Speech:Clear and Coherent ? ?Speech Volume:Normal ? ?Handedness:Right ? ? ?Mood and Affect  ?Mood:Hopeless; Dysphoric ? ?Affect:Congruent; Depressed ? ? ?Thought Process  ?Thought Processes:Coherent ? ?Descriptions of Associations:Intact ? ?Orientation:Full (Time, Place and Person) ? ?Thought Content:Rumination ? ?History of Schizophrenia/Schizoaffective disorder:No ? ?Duration of Psychotic Symptoms:N/A ? ?Hallucinations:No data  recorded ?Ideas of Reference:None ? ?Suicidal Thoughts:No data recorded ?Homicidal Thoughts:No data recorded ? ?Sensorium  ?Memory:Immediate Good; Recent Good; Remote Good ? ?Judgment:Impaired ? ?Insight:Lacking ? ? ?Executive Functions  ?Concentration:Fair ? ?Attention Span:Fair ? ?Recall:Good ? ?Fund of Knowledge:Good ? ?Language:Good ? ? ?Psychomotor Activity  ?Psychomotor Activity:No data recorded ? ?Assets  ?Assets:Communication Skills; Desire for Improvement ? ? ?Sleep  ?Sleep:No data recorded ? ? ?Physical Exam: ?Physical Exam ?Vitals and nursing note reviewed.  ?Constitutional:   ?  Appearance: Normal appearance.  ?HENT:  ?   Head: Normocephalic and atraumatic.  ?   Mouth/Throat:  ?   Pharynx: Oropharynx is clear.  ?Eyes:  ?   Pupils: Pupils are equal, round, and reactive to light.  ?Cardiovascular:  ?   Rate and Rhythm: Normal rate and regular rhythm.  ?Pulmonary:  ?   Effort: Pulmonary effort is normal.  ?   Breath sounds: Normal breath sounds.  ?Abdominal:  ?   General: Abdomen is flat.  ?   Palpations: Abdomen is soft.  ?Musculoskeletal:     ?   General: Normal range of motion.  ?Skin: ?   General: Skin is warm and dry.  ?Neurological:  ?   General: No focal deficit present.  ?   Mental Status: She is alert. Mental status is at baseline.  ?Psychiatric:     ?   Attention and Perception: Attention normal.     ?   Mood and Affect: Mood normal. Affect is blunt.     ?   Speech: Speech normal.     ?   Behavior: Behavior normal.     ?   Thought Content: Thought content normal.     ?   Cognition and Memory: Cognition normal.     ?   Judgment: Judgment normal.  ? ?Review of Systems  ?Constitutional: Negative.   ?HENT: Negative.    ?Eyes: Negative.   ?Respiratory: Negative.    ?Cardiovascular: Negative.   ?Gastrointestinal: Negative.   ?Musculoskeletal: Negative.   ?Skin: Negative.   ?Neurological: Negative.   ?Psychiatric/Behavioral:  Positive for depression. Negative for hallucinations, memory loss, substance  abuse and suicidal ideas. The patient is nervous/anxious. The patient does not have insomnia.   ?Blood pressure 118/72, pulse (!) 115, temperature 98.2 ?F (36.8 ?C), temperature source Oral, resp. rate 18, height 5'

## 2021-04-05 DIAGNOSIS — F332 Major depressive disorder, recurrent severe without psychotic features: Secondary | ICD-10-CM | POA: Diagnosis not present

## 2021-04-05 MED ORDER — IPRATROPIUM-ALBUTEROL 0.5-2.5 (3) MG/3ML IN SOLN
3.0000 mL | Freq: Four times a day (QID) | RESPIRATORY_TRACT | Status: DC | PRN
  Filled 2021-04-05: qty 3

## 2021-04-05 NOTE — Progress Notes (Signed)
Patient has been pleasant and cooperative. Denies SI, HI and AVH 

## 2021-04-05 NOTE — Plan of Care (Signed)
  Problem: Education: Goal: Knowledge of [facility] Education information/materials will improve Outcome: Progressing Goal: Emotional status will improve Outcome: Progressing Goal: Mental status will improve Outcome: Progressing Goal: Verbalization of understanding the information provided will improve Outcome: Progressing   Problem: Activity: Goal: Interest or engagement in activities will improve Outcome: Progressing Goal: Sleeping patterns will improve Outcome: Progressing   Problem: Coping: Goal: Ability to verbalize frustrations and anger appropriately will improve Outcome: Progressing Goal: Ability to demonstrate self-control will improve Outcome: Progressing   Problem: Health Behavior/Discharge Planning: Goal: Identification of resources available to assist in meeting health care needs will improve Outcome: Progressing Goal: Compliance with treatment plan for underlying cause of condition will improve Outcome: Progressing   Problem: Physical Regulation: Goal: Ability to maintain clinical measurements within normal limits will improve Outcome: Progressing   Problem: Safety: Goal: Periods of time without injury will increase Outcome: Progressing   Problem: Education: Goal: Utilization of techniques to improve thought processes will improve Outcome: Progressing Goal: Knowledge of the prescribed therapeutic regimen will improve Outcome: Progressing   Problem: Activity: Goal: Interest or engagement in leisure activities will improve Outcome: Progressing Goal: Imbalance in normal sleep/wake cycle will improve Outcome: Progressing   Problem: Coping: Goal: Coping ability will improve Outcome: Progressing Goal: Will verbalize feelings Outcome: Progressing   Problem: Health Behavior/Discharge Planning: Goal: Ability to make decisions will improve Outcome: Progressing Goal: Compliance with therapeutic regimen will improve Outcome: Progressing    Problem: Role Relationship: Goal: Will demonstrate positive changes in social behaviors and relationships Outcome: Progressing   Problem: Safety: Goal: Ability to disclose and discuss suicidal ideas will improve Outcome: Progressing Goal: Ability to identify and utilize support systems that promote safety will improve Outcome: Progressing   Problem: Self-Concept: Goal: Will verbalize positive feelings about self Outcome: Progressing Goal: Level of anxiety will decrease Outcome: Progressing   

## 2021-04-05 NOTE — Group Note (Signed)
LCSW Group Therapy Note ? ?Group Date: 04/05/2021 ?Start Time: 1315 ?End Time: [id number] ? ? ?Type of Therapy and Topic:  Group Therapy - Healthy vs Unhealthy Coping Skills ? ?Participation Level:  Did Not Attend  ? ?Description of Group ?The focus of this group was to determine what unhealthy coping techniques typically are used by group members and what healthy coping techniques would be helpful in coping with various problems. Patients were guided in becoming aware of the differences between healthy and unhealthy coping techniques. Patients were asked to identify 2-3 healthy coping skills they would like to learn to use more effectively. ? ?Therapeutic Goals ?Patients learned that coping is what human beings do all day long to deal with various situations in their lives ?Patients defined and discussed healthy vs unhealthy coping techniques ?Patients identified their preferred coping techniques and identified whether these were healthy or unhealthy ?Patients determined 2-3 healthy coping skills they would like to become more familiar with and use more often. ?Patients provided support and ideas to each other ? ? ?Summary of Patient Progress:  Due to limited staffing, group was not held on the unit.  ? ?Therapeutic Modalities ?Cognitive Behavioral Therapy ?Motivational Interviewing ? ?Kenna Gilbert Dickey Caamano, LCSWA ?04/05/2021  3:20 PM   ?

## 2021-04-05 NOTE — Plan of Care (Signed)
D: Pt alert and oriented. Pt denies experiencing any anxiety/depression at this time. Pt denies experiencing any pain at this time. Pt denies experiencing any SI/HI, or AVH at this time.  ? ?A: Scheduled medications administered to pt, per MD orders. Support and encouragement provided. Frequent verbal contact made. Routine safety checks conducted q15 minutes.  ? ?R: No adverse drug reactions noted. Pt verbally contracts for safety at this time. Pt compliant with medications. Pt interacts minimally with others on the unit. Pt remains safe at this time. Will continue to monitor.  ? ?Problem: Education: ?Goal: Knowledge of [facility] Education information/materials will improve ?Outcome: Progressing ?  ?Problem: Activity: ?Goal: Interest or engagement in activities will improve ?Outcome: Not Progressing ?  ?

## 2021-04-05 NOTE — Progress Notes (Signed)
[facility] MD Progress Note ? ?04/05/2021 2:22 PM ?Rachel Nolan  ?MRN:  [id number] ?Subjective: pt seen and chart reviewed.  ?She reports feeling better today, toothache is better with antibiotic.  ?She said that she had nightmare last night, about her nephew got killed in Oct. 2022.  She is open to trauma therapy.  ? ?No Si or HI.  ? ?Principal Problem: Depression ?Diagnosis: Principal Problem: ?  Depression ?Active Problems: ?  Post traumatic stress disorder (PTSD) ?  Cocaine use disorder (HCC) ?  Essential hypertension ?  Asthma ?  Nicotine dependence, cigarettes, uncomplicated ? ?Total Time spent with patient: 20 minutes ? ?Past Psychiatric History: Past history of recurrent depression and chronic substance abuse ? ?Past Medical History:  ?Past Medical History:  ?Diagnosis Date  ? Anxiety   ? Asthma   ? COPD (chronic obstructive pulmonary disease) (HCC)   ? Depression   ? Hypertension   ? Scoliosis   ?  ?Past Surgical History:  ?Procedure Laterality Date  ? BACK SURGERY    ? ECTOPIC PREGNANCY SURGERY    ? ?Family History:  ?Family History  ?Problem Relation Age of Onset  ? Hypertension Mother   ? Hypertension Father   ? ?Family Psychiatric  History: See previous.  Positive for substance abuse in immediate family ?Social History:  ?Social History  ? ?Substance and Sexual Activity  ?Alcohol Use Yes  ? Comment: one 40 oz per month  ?   ?Social History  ? ?Substance and Sexual Activity  ?Drug Use Yes  ? Types: Marijuana, Cocaine, "Crack" cocaine, Heroin  ?  ?Social History  ? ?Socioeconomic History  ? Marital status: Widowed  ?  Spouse name: Not on file  ? Number of children: Not on file  ? Years of education: Not on file  ? Highest education level: Not on file  ?Occupational History  ? Not on file  ?Tobacco Use  ? Smoking status: Every Day  ?  Packs/day: 1.00  ?  Types: Cigarettes  ? Smokeless tobacco: Never  ?Vaping Use  ? Vaping Use: Never used  ?Substance and Sexual Activity  ? Alcohol use: Yes  ?  Comment: one 40 oz  per month  ? Drug use: Yes  ?  Types: Marijuana, Cocaine, "Crack" cocaine, Heroin  ? Sexual activity: Not Currently  ?Other Topics Concern  ? Not on file  ?Social History Narrative  ? Pt is homeless, no fixed address; not followed by an outpatient psychiatrist  ? ?Social Determinants of Health  ? ?Financial Resource Strain: Not on file  ?Food Insecurity: Not on file  ?Transportation Needs: Not on file  ?Physical Activity: Not on file  ?Stress: Not on file  ?Social Connections: Not on file  ? ?Additional Social History:  ? ? ?Sleep: Fair ? ?Appetite:  Fair ? ?Current Medications: ?Current Facility-Administered Medications  ?Medication Dose Route Frequency Provider Last Rate Last Admin  ? acetaminophen (TYLENOL) tablet 650 mg  650 mg Oral Q6H PRN Clapacs, John T, MD   650 mg at 04/04/21 2100  ? albuterol (PROVENTIL) (2.5 MG/3ML) 0.083% nebulizer solution 2.5 mg  2.5 mg Nebulization Q4H PRN Clapacs, John T, MD      ? albuterol (VENTOLIN HFA) 108 (90 Base) MCG/ACT inhaler 2 puff  2 puff Inhalation Q4H PRN Clapacs, Jackquline Denmark, MD   2 puff at 04/05/21 1020  ? alum & mag hydroxide-simeth (MAALOX/MYLANTA) 200-200-20 MG/5ML suspension 30 mL  30 mL Oral Q4H PRN Clapacs, Jackquline Denmark, MD      ?  amoxicillin-clavulanate (AUGMENTIN) 875-125 MG per tablet 1 tablet  1 tablet Oral Q12H Kailena Lubas, MD   1 tablet at 04/05/21 0813  ? benzocaine (ORAJEL) 10 % mucosal gel   Mouth/Throat QID PRN Ayleah Hofmeister, MD   Given at 04/05/21 0814  ? FLUoxetine (PROZAC) capsule 40 mg  40 mg Oral q morning Clapacs, Jackquline Denmark, MD   40 mg at 04/05/21 1015  ? gabapentin (NEURONTIN) capsule 200 mg  200 mg Oral TID Clapacs, Jackquline Denmark, MD   200 mg at 04/05/21 1217  ? hydrOXYzine (ATARAX) tablet 50 mg  50 mg Oral TID PRN Clapacs, Jackquline Denmark, MD   50 mg at 03/31/21 2992  ? ipratropium-albuterol (DUONEB) 0.5-2.5 (3) MG/3ML nebulizer solution 3 mL  3 mL Nebulization Q6H PRN Clapacs, John T, MD      ? magnesium hydroxide (MILK OF MAGNESIA) suspension 30 mL  30 mL Oral Daily PRN Clapacs,  John T, MD      ? montelukast (SINGULAIR) tablet 10 mg  10 mg Oral QHS Clapacs, John T, MD   10 mg at 04/04/21 2100  ? nicotine (NICODERM CQ - dosed in mg/24 hours) patch 21 mg  21 mg Transdermal Daily Clapacs, Jackquline Denmark, MD   21 mg at 04/03/21 0804  ? prazosin (MINIPRESS) capsule 1 mg  1 mg Oral QHS Clapacs, Jackquline Denmark, MD   1 mg at 04/04/21 2100  ? QUEtiapine (SEROQUEL) tablet 100 mg  100 mg Oral QHS Clapacs, Jackquline Denmark, MD   100 mg at 04/04/21 2230  ? traZODone (DESYREL) tablet 150 mg  150 mg Oral QHS PRN Clapacs, Jackquline Denmark, MD   150 mg at 04/04/21 2100  ? ? ?Lab Results: No results found for this or any previous visit (from the past 48 hour(s)). ? ?Blood Alcohol level:  ?Lab Results  ?Component Value Date  ? ETH <10 03/23/2021  ? ETH <10 09/21/2020  ? ? ?Metabolic Disorder Labs: ?Lab Results  ?Component Value Date  ? HGBA1C 5.6 03/28/2021  ? MPG 114 03/28/2021  ? MPG 111 09/21/2020  ? ?No results found for: PROLACTIN ?Lab Results  ?Component Value Date  ? CHOL 149 03/28/2021  ? TRIG 200 (H) 03/28/2021  ? HDL 52 03/28/2021  ? CHOLHDL 2.9 03/28/2021  ? VLDL 40 03/28/2021  ? LDLCALC 57 03/28/2021  ? LDLCALC 56 09/21/2020  ? ? ?Physical Findings: ?AIMS:  , ,  ,  ,    ?CIWA:    ?COWS:    ? ?Musculoskeletal: ?Strength & Muscle Tone: within normal limits ?Gait & Station: normal ?Patient leans: N/A ? ?Psychiatric Specialty Exam: ? ?Presentation  ?General Appearance: Appropriate for Environment; Fairly Groomed ? ?Eye Contact:Fair ? ?Speech:Clear and Coherent ? ?Speech Volume:Normal ? ?Handedness:Right ? ? ?Mood and Affect  ?Mood:Hopeless; Dysphoric ? ?Affect:Congruent; Depressed ? ? ?Thought Process  ?Thought Processes:Coherent ? ?Descriptions of Associations:Intact ? ?Orientation:Full (Time, Place and Person) ? ?Thought Content:Rumination ? ?History of Schizophrenia/Schizoaffective disorder:No ? ?Duration of Psychotic Symptoms:N/A ? ?Hallucinations:No data recorded ?Ideas of Reference:None ? ?Suicidal Thoughts:No data  recorded ?Homicidal Thoughts:No data recorded ? ?Sensorium  ?Memory:Immediate Good; Recent Good; Remote Good ? ?Judgment:Impaired ? ?Insight:Lacking ? ? ?Executive Functions  ?Concentration:Fair ? ?Attention Span:Fair ? ?Recall:Good ? ?Fund of Knowledge:Good ? ?Language:Good ? ? ?Psychomotor Activity  ?Psychomotor Activity:No data recorded ? ?Assets  ?Assets:Communication Skills; Desire for Improvement ? ? ?Sleep  ?Sleep:No data recorded ? ? ?Physical Exam: ?Physical Exam ?Vitals and nursing note reviewed.  ?Constitutional:   ?  Appearance: Normal appearance.  ?HENT:  ?   Head: Normocephalic and atraumatic.  ?   Mouth/Throat:  ?   Pharynx: Oropharynx is clear.  ?Eyes:  ?   Pupils: Pupils are equal, round, and reactive to light.  ?Cardiovascular:  ?   Rate and Rhythm: Normal rate and regular rhythm.  ?Pulmonary:  ?   Effort: Pulmonary effort is normal.  ?   Breath sounds: Normal breath sounds.  ?Abdominal:  ?   General: Abdomen is flat.  ?   Palpations: Abdomen is soft.  ?Musculoskeletal:     ?   General: Normal range of motion.  ?Skin: ?   General: Skin is warm and dry.  ?Neurological:  ?   General: No focal deficit present.  ?   Mental Status: She is alert. Mental status is at baseline.  ?Psychiatric:     ?   Attention and Perception: Attention normal.     ?   Mood and Affect: Mood normal. Affect is blunt and flat.     ?   Speech: Speech normal.     ?   Behavior: Behavior normal. Behavior is cooperative.     ?   Thought Content: Thought content normal.     ?   Cognition and Memory: Cognition normal.     ?   Judgment: Judgment normal.  ? ?Review of Systems  ?Constitutional: Negative.   ?HENT: Negative.    ?Eyes: Negative.   ?Respiratory: Negative.    ?Cardiovascular: Negative.   ?Gastrointestinal: Negative.   ?Genitourinary: Negative.   ?Musculoskeletal: Negative.   ?Skin: Negative.   ?Neurological: Negative.   ?Endo/Heme/Allergies: Negative.   ?Psychiatric/Behavioral:  Positive for depression. Negative for  hallucinations, memory loss, substance abuse and suicidal ideas. The patient is nervous/anxious. The patient does not have insomnia.   ?Blood pressure 125/82, pulse (!) 101, temperature 98.2 ?F (36.8 ?C), temperature source

## 2021-04-06 DIAGNOSIS — F332 Major depressive disorder, recurrent severe without psychotic features: Secondary | ICD-10-CM | POA: Diagnosis not present

## 2021-04-06 NOTE — Progress Notes (Signed)
Patient pleasant and cooperative. She denies si hi avh depression, anxiety and pain.  She has been isolative to her room, but gets along well with her peers. She is med compliant and received her meds without incident. Provided patient  CPAP. ? ? ?C Butler-Nicholson, LPN ?

## 2021-04-06 NOTE — Progress Notes (Signed)
Pt visible on the unit , interacting with peers or staff. She dines Si/HI and AVH. She reports being admitted due to drug use. Pt has ongoing  history of  respiratory problems, but no signs of respiratory distress. Cpap in use at night. ?

## 2021-04-06 NOTE — Progress Notes (Signed)
[facility] MD Progress Note ? ?04/06/2021 2:53 PM ?Rachel Nolan  ?MRN:  [id number] ?Subjective: Follow-up 51 year old woman with depression and substance abuse.  Patient says she has made some phone calls and thinks that she can go back to stay with a different sister.  Also that she is following up with a substance abuse program tomorrow.  No active suicidal ideation.  Still spends most of her time in bed.  Complains of back pain which is probably predictable given that she spends all her time in bed. ?Principal Problem: Depression ?Diagnosis: Principal Problem: ?  Depression ?Active Problems: ?  Post traumatic stress disorder (PTSD) ?  Cocaine use disorder (HCC) ?  Essential hypertension ?  Asthma ?  Nicotine dependence, cigarettes, uncomplicated ? ?Total Time spent with patient: 30 minutes ? ?Past Psychiatric History: Past history of recurrent depression and suicidal threats that are all accompanied by homelessness and substance abuse ? ?Past Medical History:  ?Past Medical History:  ?Diagnosis Date  ? Anxiety   ? Asthma   ? COPD (chronic obstructive pulmonary disease) (HCC)   ? Depression   ? Hypertension   ? Scoliosis   ?  ?Past Surgical History:  ?Procedure Laterality Date  ? BACK SURGERY    ? ECTOPIC PREGNANCY SURGERY    ? ?Family History:  ?Family History  ?Problem Relation Age of Onset  ? Hypertension Mother   ? Hypertension Father   ? ?Family Psychiatric  History: See previous ?Social History:  ?Social History  ? ?Substance and Sexual Activity  ?Alcohol Use Yes  ? Comment: one 40 oz per month  ?   ?Social History  ? ?Substance and Sexual Activity  ?Drug Use Yes  ? Types: Marijuana, Cocaine, "Crack" cocaine, Heroin  ?  ?Social History  ? ?Socioeconomic History  ? Marital status: Widowed  ?  Spouse name: Not on file  ? Number of children: Not on file  ? Years of education: Not on file  ? Highest education level: Not on file  ?Occupational History  ? Not on file  ?Tobacco Use  ? Smoking status: Every Day  ?   Packs/day: 1.00  ?  Types: Cigarettes  ? Smokeless tobacco: Never  ?Vaping Use  ? Vaping Use: Never used  ?Substance and Sexual Activity  ? Alcohol use: Yes  ?  Comment: one 40 oz per month  ? Drug use: Yes  ?  Types: Marijuana, Cocaine, "Crack" cocaine, Heroin  ? Sexual activity: Not Currently  ?Other Topics Concern  ? Not on file  ?Social History Narrative  ? Pt is homeless, no fixed address; not followed by an outpatient psychiatrist  ? ?Social Determinants of Health  ? ?Financial Resource Strain: Not on file  ?Food Insecurity: Not on file  ?Transportation Needs: Not on file  ?Physical Activity: Not on file  ?Stress: Not on file  ?Social Connections: Not on file  ? ?Additional Social History:  ?  ?  ?  ?  ?  ?  ?  ?  ?  ?  ?  ? ?Sleep: Fair ? ?Appetite:  Fair ? ?Current Medications: ?Current Facility-Administered Medications  ?Medication Dose Route Frequency Provider Last Rate Last Admin  ? acetaminophen (TYLENOL) tablet 650 mg  650 mg Oral Q6H PRN Latoshia Monrroy T, MD   650 mg at 04/04/21 2100  ? albuterol (PROVENTIL) (2.5 MG/3ML) 0.083% nebulizer solution 2.5 mg  2.5 mg Nebulization Q4H PRN Jabir Dahlem, Jackquline Denmark, MD      ? albuterol (VENTOLIN HFA) 108 (  90 Base) MCG/ACT inhaler 2 puff  2 puff Inhalation Q4H PRN Tangee Marszalek T, MD   2 puff at 04/05/21 1020  ? alum & mag hydroxide-simeth (MAALOX/MYLANTA) 200-200-20 MG/5ML suspension 30 mL  30 mL Oral Q4H PRN Lavonne Kinderman, Jackquline Denmark, MD      ? amoxicillin-clavulanate (AUGMENTIN) 875-125 MG per tablet 1 tablet  1 tablet Oral Q12H He, Jun, MD   1 tablet at 04/06/21 0762  ? benzocaine (ORAJEL) 10 % mucosal gel   Mouth/Throat QID PRN He, Jun, MD   Given at 04/05/21 0814  ? FLUoxetine (PROZAC) capsule 40 mg  40 mg Oral q morning Roshawn Ayala T, MD   40 mg at 04/06/21 1114  ? gabapentin (NEURONTIN) capsule 200 mg  200 mg Oral TID Pyper Olexa T, MD   200 mg at 04/06/21 1114  ? hydrOXYzine (ATARAX) tablet 50 mg  50 mg Oral TID PRN Pinkney Venard, Jackquline Denmark, MD   50 mg at 03/31/21 2633  ?  ipratropium-albuterol (DUONEB) 0.5-2.5 (3) MG/3ML nebulizer solution 3 mL  3 mL Nebulization Q6H PRN Veola Cafaro T, MD      ? magnesium hydroxide (MILK OF MAGNESIA) suspension 30 mL  30 mL Oral Daily PRN Carliyah Cotterman, Jackquline Denmark, MD   30 mL at 04/05/21 2134  ? montelukast (SINGULAIR) tablet 10 mg  10 mg Oral QHS Jiali Linney T, MD   10 mg at 04/05/21 2113  ? nicotine (NICODERM CQ - dosed in mg/24 hours) patch 21 mg  21 mg Transdermal Daily Izic Stfort, Jackquline Denmark, MD   21 mg at 04/03/21 0804  ? prazosin (MINIPRESS) capsule 1 mg  1 mg Oral QHS Clodagh Odenthal, Jackquline Denmark, MD   1 mg at 04/05/21 2112  ? QUEtiapine (SEROQUEL) tablet 100 mg  100 mg Oral QHS Nikala Walsworth, Jackquline Denmark, MD   100 mg at 04/05/21 2113  ? traZODone (DESYREL) tablet 150 mg  150 mg Oral QHS PRN Meloney Feld, Jackquline Denmark, MD   150 mg at 04/04/21 2100  ? ? ?Lab Results: No results found for this or any previous visit (from the past 48 hour(s)). ? ?Blood Alcohol level:  ?Lab Results  ?Component Value Date  ? ETH <10 03/23/2021  ? ETH <10 09/21/2020  ? ? ?Metabolic Disorder Labs: ?Lab Results  ?Component Value Date  ? HGBA1C 5.6 03/28/2021  ? MPG 114 03/28/2021  ? MPG 111 09/21/2020  ? ?No results found for: PROLACTIN ?Lab Results  ?Component Value Date  ? CHOL 149 03/28/2021  ? TRIG 200 (H) 03/28/2021  ? HDL 52 03/28/2021  ? CHOLHDL 2.9 03/28/2021  ? VLDL 40 03/28/2021  ? LDLCALC 57 03/28/2021  ? LDLCALC 56 09/21/2020  ? ? ?Physical Findings: ?AIMS:  , ,  ,  ,    ?CIWA:    ?COWS:    ? ?Musculoskeletal: ?Strength & Muscle Tone: within normal limits ?Gait & Station: normal ?Patient leans: N/A ? ?Psychiatric Specialty Exam: ? ?Presentation  ?General Appearance: Appropriate for Environment; Fairly Groomed ? ?Eye Contact:Fair ? ?Speech:Clear and Coherent ? ?Speech Volume:Normal ? ?Handedness:Right ? ? ?Mood and Affect  ?Mood:Hopeless; Dysphoric ? ?Affect:Congruent; Depressed ? ? ?Thought Process  ?Thought Processes:Coherent ? ?Descriptions of Associations:Intact ? ?Orientation:Full (Time, Place and  Person) ? ?Thought Content:Rumination ? ?History of Schizophrenia/Schizoaffective disorder:No ? ?Duration of Psychotic Symptoms:N/A ? ?Hallucinations:No data recorded ?Ideas of Reference:None ? ?Suicidal Thoughts:No data recorded ?Homicidal Thoughts:No data recorded ? ?Sensorium  ?Memory:Immediate Good; Recent Good; Remote Good ? ?Judgment:Impaired ? ?Insight:Lacking ? ? ?Executive Functions  ?  Concentration:Fair ? ?Attention Span:Fair ? ?Recall:Good ? ?Fund of Knowledge:Good ? ?Language:Good ? ? ?Psychomotor Activity  ?Psychomotor Activity:No data recorded ? ?Assets  ?Assets:Communication Skills; Desire for Improvement ? ? ?Sleep  ?Sleep:No data recorded ? ? ?Physical Exam: ?Physical Exam ?Vitals and nursing note reviewed.  ?Constitutional:   ?   Appearance: Normal appearance.  ?HENT:  ?   Head: Normocephalic and atraumatic.  ?   Mouth/Throat:  ?   Pharynx: Oropharynx is clear.  ?Eyes:  ?   Pupils: Pupils are equal, round, and reactive to light.  ?Cardiovascular:  ?   Rate and Rhythm: Normal rate and regular rhythm.  ?Pulmonary:  ?   Effort: Pulmonary effort is normal.  ?   Breath sounds: Normal breath sounds.  ?Abdominal:  ?   General: Abdomen is flat.  ?   Palpations: Abdomen is soft.  ?Musculoskeletal:     ?   General: Normal range of motion.  ?Skin: ?   General: Skin is warm and dry.  ?Neurological:  ?   General: No focal deficit present.  ?   Mental Status: She is alert. Mental status is at baseline.  ?Psychiatric:     ?   Mood and Affect: Mood normal.     ?   Thought Content: Thought content normal.  ? ?Review of Systems  ?Constitutional: Negative.   ?HENT: Negative.    ?Eyes: Negative.   ?Respiratory: Negative.    ?Cardiovascular: Negative.   ?Gastrointestinal: Negative.   ?Musculoskeletal:  Positive for back pain.  ?Skin: Negative.   ?Neurological: Negative.   ?Psychiatric/Behavioral: Negative.    ?Blood pressure 114/90, pulse 98, temperature 98.9 ?F (37.2 ?C), temperature source Oral, resp. rate 18, height  5\' 6"  (1.676 m), weight 79.4 kg, last menstrual period 06/19/2018, SpO2 96 %. Body mass index is 28.25 kg/m?. ? ? ?Treatment Plan Summary: ?Medication management and Plan doing well.  Stable.  No need for clinic

## 2021-04-06 NOTE — Progress Notes (Signed)
Patient denies pain. Patient denies SI/HI/AVH. Patient denies anxiety and depression. Patient compliant with medication administration. Patient isolative to room with the exception to coming out for meals.  ?Q15 minute safety checks maintained. Patient remains safe on the unit at this time.  ?

## 2021-04-06 NOTE — Progress Notes (Signed)
Recreation Therapy Notes ? ?Date: 04/06/2021 ? ?Time: 10:50 am ? ?Location: Craft room  ? ?Behavioral response: N/A ? ?Intervention Topic: Self-care  ? ?Discussion/Intervention: ?Patient refused to attend group. ? ?Clinical Observations/Feedback: ?Patient refused to attend group. ? ?Oralee Rapaport LRT/CTRS ? ? ? ? ? ? ? ?Zarah Carbon ?04/06/2021 11:57 AM ?

## 2021-04-06 NOTE — Plan of Care (Signed)
?  Problem: Education: ?Goal: Knowledge of [facility] Education information/materials will improve ?Outcome: Progressing ?Goal: Emotional status will improve ?Outcome: Progressing ?Goal: Mental status will improve ?Outcome: Progressing ?Goal: Verbalization of understanding the information provided will improve ?Outcome: Progressing ?  ?Problem: Activity: ?Goal: Interest or engagement in activities will improve ?Outcome: Not Progressing ?Goal: Sleeping patterns will improve ?Outcome: Progressing ?  ?Problem: Coping: ?Goal: Ability to verbalize frustrations and anger appropriately will improve ?Outcome: Progressing ?  ?Problem: Education: ?Goal: Knowledge of the prescribed therapeutic regimen will improve ?Outcome: Progressing ?  ?Problem: Health Behavior/Discharge Planning: ?Goal: Compliance with therapeutic regimen will improve ?Outcome: Progressing ?  ?Problem: Safety: ?Goal: Ability to disclose and discuss suicidal ideas will improve ?Outcome: Progressing ?  ?Problem: Self-Concept: ?Goal: Level of anxiety will decrease ?Outcome: Progressing ?  ?

## 2021-04-06 NOTE — Group Note (Signed)
[facility] LCSW Group Therapy Note ? ? ? ?Group Date: 04/06/2021 ?Start Time: 1300 ?End Time: 1400 ? ?Type of Therapy and Topic:  Group Therapy:  Overcoming Obstacles ? ?Participation Level:  [facility] PARTICIPATION LEVEL: Did Not Attend ? ?Mood: ? ?Description of Group:   ?In this group patients will be encouraged to explore what they see as obstacles to their own wellness and recovery. They will be guided to discuss their thoughts, feelings, and behaviors related to these obstacles. The group will process together ways to cope with barriers, with attention given to specific choices patients can make. Each patient will be challenged to identify changes they are motivated to make in order to overcome their obstacles. This group will be process-oriented, with patients participating in exploration of their own experiences as well as giving and receiving support and challenge from other group members. ? ?Therapeutic Goals: ?1. Patient will identify personal and current obstacles as they relate to admission. ?2. Patient will identify barriers that currently interfere with their wellness or overcoming obstacles.  ?3. Patient will identify feelings, thought process and behaviors related to these barriers. ?4. Patient will identify two changes they are willing to make to overcome these obstacles:  ? ? ?Summary of Patient Progress ? ? ?X ? ? ?Therapeutic Modalities:   ?Cognitive Behavioral Therapy ?Solution Focused Therapy ?Motivational Interviewing ?Relapse Prevention Therapy ? ? ?Jencarlo Bonadonna J Zinnia Tindall, LCSW ?

## 2021-04-07 ENCOUNTER — Other Ambulatory Visit: Payer: Self-pay

## 2021-04-07 DIAGNOSIS — F332 Major depressive disorder, recurrent severe without psychotic features: Secondary | ICD-10-CM | POA: Diagnosis not present

## 2021-04-07 MED ORDER — QUETIAPINE FUMARATE 100 MG PO TABS
100.0000 mg | ORAL_TABLET | Freq: Every day | ORAL | 0 refills | Status: DC
  Filled 2021-04-07: qty 10, 10d supply, fill #0

## 2021-04-07 MED ORDER — TRAZODONE HCL 150 MG PO TABS
150.0000 mg | ORAL_TABLET | Freq: Every evening | ORAL | 0 refills | Status: DC | PRN
  Filled 2021-04-07: qty 10, 10d supply, fill #0

## 2021-04-07 MED ORDER — PRAZOSIN HCL 1 MG PO CAPS
1.0000 mg | ORAL_CAPSULE | Freq: Every day | ORAL | 0 refills | Status: DC
  Filled 2021-04-07: qty 10, 10d supply, fill #0

## 2021-04-07 MED ORDER — NICOTINE 21 MG/24HR TD PT24
21.0000 mg | MEDICATED_PATCH | Freq: Every day | TRANSDERMAL | 0 refills | Status: DC
  Filled 2021-04-07: qty 14, 14d supply, fill #0

## 2021-04-07 MED ORDER — MONTELUKAST SODIUM 10 MG PO TABS
10.0000 mg | ORAL_TABLET | Freq: Every day | ORAL | 0 refills | Status: DC
  Filled 2021-04-07: qty 10, 10d supply, fill #0

## 2021-04-07 MED ORDER — GABAPENTIN 100 MG PO CAPS
200.0000 mg | ORAL_CAPSULE | Freq: Three times a day (TID) | ORAL | 0 refills | Status: DC
  Filled 2021-04-07: qty 60, 10d supply, fill #0

## 2021-04-07 MED ORDER — AMOXICILLIN-POT CLAVULANATE 875-125 MG PO TABS
1.0000 | ORAL_TABLET | Freq: Two times a day (BID) | ORAL | 0 refills | Status: DC
  Filled 2021-04-07: qty 14, 7d supply, fill #0

## 2021-04-07 MED ORDER — ALBUTEROL SULFATE HFA 108 (90 BASE) MCG/ACT IN AERS
2.0000 | INHALATION_SPRAY | RESPIRATORY_TRACT | 0 refills | Status: DC | PRN
  Filled 2021-04-07: qty 8.5, 30d supply, fill #0

## 2021-04-07 MED ORDER — HYDROXYZINE HCL 50 MG PO TABS
50.0000 mg | ORAL_TABLET | Freq: Three times a day (TID) | ORAL | 0 refills | Status: DC | PRN
Start: 2021-04-07 — End: 2021-04-08
  Filled 2021-04-07: qty 20, 7d supply, fill #0

## 2021-04-07 MED ORDER — FLUOXETINE HCL 40 MG PO CAPS
40.0000 mg | ORAL_CAPSULE | Freq: Every morning | ORAL | 0 refills | Status: DC
  Filled 2021-04-07: qty 10, 10d supply, fill #0

## 2021-04-07 NOTE — Group Note (Signed)
LCSW Group Therapy Note ? ? ?Group Date: 04/07/2021 ?Start Time: 1300 ?End Time: 1400 ? ? ?Type of Therapy and Topic:  Group Therapy: Boundaries ? ?Participation Level:  Did Not Attend ? ?Description of Group: ?This group will address the use of boundaries in their personal lives. Patients will explore why boundaries are important, the difference between healthy and unhealthy boundaries, and negative and postive outcomes of different boundaries and will look at how boundaries can be crossed.  Patients will be encouraged to identify current boundaries in their own lives and identify what kind of boundary is being set. Facilitators will guide patients in utilizing problem-solving interventions to address and correct types boundaries being used and to address when no boundary is being used. Understanding and applying boundaries will be explored and addressed for obtaining and maintaining a balanced life. Patients will be encouraged to explore ways to assertively make their boundaries and needs known to significant others in their lives, using other group members and facilitator for role play, support, and feedback. ? ?Therapeutic Goals: ? ?1.  Patient will identify areas in their life where setting clear boundaries could be  used to improve their life.  ?2.  Patient will identify signs/triggers that a boundary is not being respected. ?3.  Patient will identify two ways to set boundaries in order to achieve balance in  their lives: ?4.  Patient will demonstrate ability to communicate their needs and set boundaries  through discussion and/or role plays ? ?Summary of Patient Progress:  Patient did not attend. ? ?Therapeutic Modalities:   ?Cognitive Behavioral Therapy ?Solution-Focused Therapy ? ?Rosezella Florida, LCSWA ?04/07/2021  2:20 PM   ? ?

## 2021-04-07 NOTE — Progress Notes (Signed)
[facility] MD Progress Note ? ?04/07/2021 1:12 PM ?Rachel Nolan  ?MRN:  [id number] ?Subjective: Follow-up patient who is 51 year old woman with a history of depression and substance abuse.  Patient confirms that she has a plan to go stay with her sister tomorrow.  Denies suicidal ideation.  Still spends most of her time in bed but gets up to come out and eat and interacts appropriately.  No sign of psychosis ?Principal Problem: Depression ?Diagnosis: Principal Problem: ?  Depression ?Active Problems: ?  Post traumatic stress disorder (PTSD) ?  Cocaine use disorder (HCC) ?  Essential hypertension ?  Asthma ?  Nicotine dependence, cigarettes, uncomplicated ? ?Total Time spent with patient: 30 minutes ? ?Past Psychiatric History: Past history of substance abuse recurrent depressive symptoms homelessness ? ?Past Medical History:  ?Past Medical History:  ?Diagnosis Date  ? Anxiety   ? Asthma   ? COPD (chronic obstructive pulmonary disease) (HCC)   ? Depression   ? Hypertension   ? Scoliosis   ?  ?Past Surgical History:  ?Procedure Laterality Date  ? BACK SURGERY    ? ECTOPIC PREGNANCY SURGERY    ? ?Family History:  ?Family History  ?Problem Relation Age of Onset  ? Hypertension Mother   ? Hypertension Father   ? ?Family Psychiatric  History: See previous ?Social History:  ?Social History  ? ?Substance and Sexual Activity  ?Alcohol Use Yes  ? Comment: one 40 oz per month  ?   ?Social History  ? ?Substance and Sexual Activity  ?Drug Use Yes  ? Types: Marijuana, Cocaine, "Crack" cocaine, Heroin  ?  ?Social History  ? ?Socioeconomic History  ? Marital status: Widowed  ?  Spouse name: Not on file  ? Number of children: Not on file  ? Years of education: Not on file  ? Highest education level: Not on file  ?Occupational History  ? Not on file  ?Tobacco Use  ? Smoking status: Every Day  ?  Packs/day: 1.00  ?  Types: Cigarettes  ? Smokeless tobacco: Never  ?Vaping Use  ? Vaping Use: Never used  ?Substance and Sexual Activity  ?  Alcohol use: Yes  ?  Comment: one 40 oz per month  ? Drug use: Yes  ?  Types: Marijuana, Cocaine, "Crack" cocaine, Heroin  ? Sexual activity: Not Currently  ?Other Topics Concern  ? Not on file  ?Social History Narrative  ? Pt is homeless, no fixed address; not followed by an outpatient psychiatrist  ? ?Social Determinants of Health  ? ?Financial Resource Strain: Not on file  ?Food Insecurity: Not on file  ?Transportation Needs: Not on file  ?Physical Activity: Not on file  ?Stress: Not on file  ?Social Connections: Not on file  ? ?Additional Social History:  ?  ?  ?  ?  ?  ?  ?  ?  ?  ?  ?  ? ?Sleep: Fair ? ?Appetite:  Poor ? ?Current Medications: ?Current Facility-Administered Medications  ?Medication Dose Route Frequency Provider Last Rate Last Admin  ? acetaminophen (TYLENOL) tablet 650 mg  650 mg Oral Q6H PRN Laneah Luft T, MD   650 mg at 04/04/21 2100  ? albuterol (PROVENTIL) (2.5 MG/3ML) 0.083% nebulizer solution 2.5 mg  2.5 mg Nebulization Q4H PRN Gerene Nedd T, MD      ? albuterol (VENTOLIN HFA) 108 (90 Base) MCG/ACT inhaler 2 puff  2 puff Inhalation Q4H PRN Tiwana Chavis, Jackquline Denmark, MD   2 puff at 04/05/21 1020  ?  alum & mag hydroxide-simeth (MAALOX/MYLANTA) 200-200-20 MG/5ML suspension 30 mL  30 mL Oral Q4H PRN Gaige Sebo, Jackquline Denmark, MD      ? amoxicillin-clavulanate (AUGMENTIN) 875-125 MG per tablet 1 tablet  1 tablet Oral Q12H He, Jun, MD   1 tablet at 04/07/21 9244  ? benzocaine (ORAJEL) 10 % mucosal gel   Mouth/Throat QID PRN He, Jun, MD   Given at 04/05/21 0814  ? FLUoxetine (PROZAC) capsule 40 mg  40 mg Oral q morning Aleaha Fickling, Jackquline Denmark, MD   40 mg at 04/07/21 1146  ? gabapentin (NEURONTIN) capsule 200 mg  200 mg Oral TID Giovonnie Trettel T, MD   200 mg at 04/07/21 1146  ? hydrOXYzine (ATARAX) tablet 50 mg  50 mg Oral TID PRN Cadi Rhinehart, Jackquline [place], MD   50 mg at 03/31/21 6286  ? ipratropium-albuterol (DUONEB) 0.5-2.5 (3) MG/3ML nebulizer solution 3 mL  3 mL Nebulization Q6H PRN Ripley Lovecchio T, MD      ? magnesium  hydroxide (MILK OF MAGNESIA) suspension 30 mL  30 mL Oral Daily PRN Alann Avey, Jackquline Denmark, MD   30 mL at 04/05/21 2134  ? montelukast (SINGULAIR) tablet 10 mg  10 mg Oral QHS Teralyn Mullins T, MD   10 mg at 04/06/21 2114  ? nicotine (NICODERM CQ - dosed in mg/24 hours) patch 21 mg  21 mg Transdermal Daily Veta Dambrosia, Jackquline Denmark, MD   21 mg at 04/03/21 0804  ? prazosin (MINIPRESS) capsule 1 mg  1 mg Oral QHS Mayelin Panos, Jackquline Denmark, MD   1 mg at 04/06/21 2114  ? QUEtiapine (SEROQUEL) tablet 100 mg  100 mg Oral QHS Sunnie Odden T, MD   100 mg at 04/06/21 2114  ? traZODone (DESYREL) tablet 150 mg  150 mg Oral QHS PRN Darcus Edds, Jackquline Denmark, MD   150 mg at 04/04/21 2100  ? ? ?Lab Results: No results found for this or any previous visit (from the past 48 hour(s)). ? ?Blood Alcohol level:  ?Lab Results  ?Component Value Date  ? ETH <10 03/23/2021  ? ETH <10 09/21/2020  ? ? ?Metabolic Disorder Labs: ?Lab Results  ?Component Value Date  ? HGBA1C 5.6 03/28/2021  ? MPG 114 03/28/2021  ? MPG 111 09/21/2020  ? ?No results found for: PROLACTIN ?Lab Results  ?Component Value Date  ? CHOL 149 03/28/2021  ? TRIG 200 (H) 03/28/2021  ? HDL 52 03/28/2021  ? CHOLHDL 2.9 03/28/2021  ? VLDL 40 03/28/2021  ? LDLCALC 57 03/28/2021  ? LDLCALC 56 09/21/2020  ? ? ?Physical Findings: ?AIMS:  , ,  ,  ,    ?CIWA:    ?COWS:    ? ?Musculoskeletal: ?Strength & Muscle Tone: within normal limits ?Gait & Station: unsteady ?Patient leans: N/A ? ?Psychiatric Specialty Exam: ? ?Presentation  ?General Appearance: Appropriate for Environment; Fairly Groomed ? ?Eye Contact:Fair ? ?Speech:Clear and Coherent ? ?Speech Volume:Normal ? ?Handedness:Right ? ? ?Mood and Affect  ?Mood:Hopeless; Dysphoric ? ?Affect:Congruent; Depressed ? ? ?Thought Process  ?Thought Processes:Coherent ? ?Descriptions of Associations:Intact ? ?Orientation:Full (Time, Place and Person) ? ?Thought Content:Rumination ? ?History of Schizophrenia/Schizoaffective disorder:No ? ?Duration of Psychotic  Symptoms:N/A ? ?Hallucinations:No data recorded ?Ideas of Reference:None ? ?Suicidal Thoughts:No data recorded ?Homicidal Thoughts:No data recorded ? ?Sensorium  ?Memory:Immediate Good; Recent Good; Remote Good ? ?Judgment:Impaired ? ?Insight:Lacking ? ? ?Executive Functions  ?Concentration:Fair ? ?Attention Span:Fair ? ?Recall:Good ? ?Fund of Knowledge:Good ? ?Language:Good ? ? ?Psychomotor Activity  ?Psychomotor Activity:No data recorded ? ?Assets  ?Assets:Communication  Skills; Desire for Improvement ? ? ?Sleep  ?Sleep:No data recorded ? ? ?Physical Exam: ?Physical Exam ?Vitals and nursing note reviewed.  ?Constitutional:   ?   Appearance: Normal appearance.  ?HENT:  ?   Head: Normocephalic and atraumatic.  ?   Mouth/Throat:  ?   Pharynx: Oropharynx is clear.  ?Eyes:  ?   Pupils: Pupils are equal, round, and reactive to light.  ?Cardiovascular:  ?   Rate and Rhythm: Normal rate and regular rhythm.  ?Pulmonary:  ?   Effort: Pulmonary effort is normal.  ?   Breath sounds: Normal breath sounds.  ?Abdominal:  ?   General: Abdomen is flat.  ?   Palpations: Abdomen is soft.  ?Musculoskeletal:     ?   General: Normal range of motion.  ?Skin: ?   General: Skin is warm and dry.  ?Neurological:  ?   General: No focal deficit present.  ?   Mental Status: She is alert. Mental status is at baseline.  ?Psychiatric:     ?   Attention and Perception: Attention normal.     ?   Mood and Affect: Mood normal. Affect is blunt.     ?   Speech: Speech is delayed.     ?   Behavior: Behavior is slowed.     ?   Thought Content: Thought content normal.     ?   Cognition and Memory: Cognition is impaired.  ? ?Review of Systems  ?Constitutional:  Positive for malaise/fatigue.  ?HENT: Negative.    ?Eyes: Negative.   ?Respiratory: Negative.    ?Cardiovascular: Negative.   ?Gastrointestinal: Negative.   ?Musculoskeletal: Negative.   ?Skin: Negative.   ?Neurological: Negative.   ?Psychiatric/Behavioral: Negative.    ?Blood pressure (!) 116/93,  pulse 100, temperature 98.6 ?F (37 ?C), temperature source Oral, resp. rate 16, height 5\' 6"  (1.676 m), weight 79.4 kg, last menstrual period 06/19/2018, SpO2 97 %. Body mass index is 28.25 kg/m?. ? ? ?Treatment Plan Summary: ?Medic

## 2021-04-07 NOTE — Plan of Care (Signed)
?  Problem: Education: ?Goal: Knowledge of [facility] Education information/materials will improve ?Outcome: Progressing ?Goal: Emotional status will improve ?Outcome: Progressing ?Goal: Mental status will improve ?Outcome: Progressing ?Goal: Verbalization of understanding the information provided will improve ?Outcome: Progressing ?  ?Problem: Activity: ?Goal: Interest or engagement in activities will improve ?Outcome: Progressing ?Goal: Sleeping patterns will improve ?Outcome: Progressing ?  ?Problem: Health Behavior/Discharge Planning: ?Goal: Identification of resources available to assist in meeting health care needs will improve ?Outcome: Progressing ?Goal: Compliance with treatment plan for underlying cause of condition will improve ?Outcome: Progressing ?  ?Problem: Safety: ?Goal: Periods of time without injury will increase ?Outcome: Progressing ?  ?Problem: Education: ?Goal: Utilization of techniques to improve thought processes will improve ?Outcome: Progressing ?Goal: Knowledge of the prescribed therapeutic regimen will improve ?Outcome: Progressing ?  ?

## 2021-04-07 NOTE — Progress Notes (Signed)
Patient denies pain. Patient denies anxiety and depression. Patient denies SI/HI/AVH. Patient compliant with medication administration. Patient isolative to room during shift with the exception to coming out for meals and making phone calls.  ?Q15 minute safety checks maintained. Patient remains safe on the unit at this time. ?

## 2021-04-07 NOTE — Plan of Care (Signed)
?  Problem: Education: ?Goal: Knowledge of [facility] Education information/materials will improve ?Outcome: Progressing ?Goal: Emotional status will improve ?Outcome: Progressing ?Goal: Mental status will improve ?Outcome: Progressing ?Goal: Verbalization of understanding the information provided will improve ?Outcome: Progressing ?  ?Problem: Activity: ?Goal: Interest or engagement in activities will improve ?Outcome: Not Progressing ?  ?Problem: Health Behavior/Discharge Planning: ?Goal: Compliance with treatment plan for underlying cause of condition will improve ?Outcome: Progressing ?  ?Problem: Safety: ?Goal: Periods of time without injury will increase ?Outcome: Progressing ?  ?Problem: Education: ?Goal: Knowledge of the prescribed therapeutic regimen will improve ?Outcome: Progressing ?  ?Problem: Health Behavior/Discharge Planning: ?Goal: Compliance with therapeutic regimen will improve ?Outcome: Progressing ?  ?Problem: Self-Concept: ?Goal: Level of anxiety will decrease ?Outcome: Progressing ?  ?

## 2021-04-07 NOTE — Progress Notes (Signed)
Recreation Therapy Notes ? ? ?Date: 04/07/2021 ? ?Time: 10:05 am ? ?Location: Craft room  ? ?Behavioral response: N/A ? ?Intervention Topic: Problem Solving   ? ?Discussion/Intervention: ?Patient refused to attend group. ? ?Clinical Observations/Feedback: ?Patient refused to attend group. ? ?Alassane Kalafut LRT/CTRS ? ? ? ? ? ? ? ?Salimah Martinovich ?04/07/2021 11:47 AM ?

## 2021-04-08 DIAGNOSIS — F332 Major depressive disorder, recurrent severe without psychotic features: Secondary | ICD-10-CM | POA: Diagnosis not present

## 2021-04-08 MED ORDER — TRAZODONE HCL 150 MG PO TABS
150.0000 mg | ORAL_TABLET | Freq: Every evening | ORAL | 1 refills | Status: DC | PRN
  Filled 2021-05-07: qty 30, 30d supply, fill #0

## 2021-04-08 MED ORDER — ALBUTEROL SULFATE HFA 108 (90 BASE) MCG/ACT IN AERS
2.0000 | INHALATION_SPRAY | RESPIRATORY_TRACT | 1 refills | Status: DC | PRN
  Filled 2021-05-07: qty 8.5, 16d supply, fill #0

## 2021-04-08 MED ORDER — MONTELUKAST SODIUM 10 MG PO TABS
10.0000 mg | ORAL_TABLET | Freq: Every day | ORAL | 1 refills | Status: DC
  Filled 2021-05-07: qty 30, 30d supply, fill #0

## 2021-04-08 MED ORDER — PRAZOSIN HCL 1 MG PO CAPS
1.0000 mg | ORAL_CAPSULE | Freq: Every day | ORAL | 1 refills | Status: DC
  Filled 2021-05-07: qty 30, 30d supply, fill #0

## 2021-04-08 MED ORDER — GABAPENTIN 100 MG PO CAPS
200.0000 mg | ORAL_CAPSULE | Freq: Three times a day (TID) | ORAL | 1 refills | Status: DC
  Filled 2021-05-07: qty 180, 30d supply, fill #0

## 2021-04-08 MED ORDER — QUETIAPINE FUMARATE 100 MG PO TABS
100.0000 mg | ORAL_TABLET | Freq: Every day | ORAL | 1 refills | Status: DC
  Filled 2021-05-07: qty 30, 30d supply, fill #0

## 2021-04-08 MED ORDER — FLUOXETINE HCL 40 MG PO CAPS
40.0000 mg | ORAL_CAPSULE | Freq: Every morning | ORAL | 1 refills | Status: DC
  Filled 2021-05-07: qty 30, 30d supply, fill #0

## 2021-04-08 MED ORDER — NICOTINE 21 MG/24HR TD PT24
21.0000 mg | MEDICATED_PATCH | Freq: Every day | TRANSDERMAL | 0 refills | Status: DC
  Filled 2021-05-07: qty 28, 28d supply, fill #0

## 2021-04-08 MED ORDER — HYDROXYZINE HCL 50 MG PO TABS
50.0000 mg | ORAL_TABLET | Freq: Three times a day (TID) | ORAL | 1 refills | Status: DC | PRN
  Filled 2021-05-07: qty 60, 20d supply, fill #0

## 2021-04-08 NOTE — Discharge Summary (Signed)
Physician Discharge Summary Note ? ?Patient:  Rachel Nolan is an 51 y.o., female ?MRN:  [id number] ?DOB:  Dec 22, 1970 ?Patient phone:  [phone] (home)  ?Patient address:   ?[address] ?[place] [address],  ?Total Time spent with patient: 30 minutes ? ?Date of Admission:  03/28/2021 ?Date of Discharge: 04/08/2021 ? ?Reason for Admission: Patient was admitted because of depression and anxiety suicidal ideation passively and substance abuse with poor outpatient support ? ?Principal Problem: Depression ?Discharge Diagnoses: Principal Problem: ?  Depression ?Active Problems: ?  Post traumatic stress disorder (PTSD) ?  Cocaine use disorder ([place]) ?  Essential hypertension ?  Asthma ?  Nicotine dependence, cigarettes, uncomplicated ? ? ?Past Psychiatric History: Past history of depression and substance abuse.  Prior hospitalizations. ? ?Past Medical History:  ?Past Medical History:  ?Diagnosis Date  ? Anxiety   ? Asthma   ? COPD (chronic obstructive pulmonary disease) ([place])   ? Depression   ? Hypertension   ? Scoliosis   ?  ?Past Surgical History:  ?Procedure Laterality Date  ? BACK SURGERY    ? ECTOPIC PREGNANCY SURGERY    ? ?Family History:  ?Family History  ?Problem Relation Age of Onset  ? Hypertension Mother   ? Hypertension Father   ? ?Family Psychiatric  History: See previous. ?Social History:  ?Social History  ? ?Substance and Sexual Activity  ?Alcohol Use Yes  ? Comment: one 40 oz per month  ?   ?Social History  ? ?Substance and Sexual Activity  ?Drug Use Yes  ? Types: Marijuana, Cocaine, "Crack" cocaine, Heroin  ?  ?Social History  ? ?Socioeconomic History  ? Marital status: Widowed  ?  Spouse name: Not on file  ? Number of children: Not on file  ? Years of education: Not on file  ? Highest education level: Not on file  ?Occupational History  ? Not on file  ?Tobacco Use  ? Smoking status: Every Day  ?  Packs/day: 1.00  ?  Types: Cigarettes  ? Smokeless tobacco: Never  ?Vaping Use  ? Vaping Use: Never  used  ?Substance and Sexual Activity  ? Alcohol use: Yes  ?  Comment: one 40 oz per month  ? Drug use: Yes  ?  Types: Marijuana, Cocaine, "Crack" cocaine, Heroin  ? Sexual activity: Not Currently  ?Other Topics Concern  ? Not on file  ?Social History Narrative  ? Pt is homeless, no fixed address; not followed by an outpatient psychiatrist  ? ?Social Determinants of Health  ? ?Financial Resource Strain: Not on file  ?Food Insecurity: Not on file  ?Transportation Needs: Not on file  ?Physical Activity: Not on file  ?Stress: Not on file  ?Social Connections: Not on file  ? ? ?Hospital Course: Patient kept on 15-minute checks.  She did not display dangerous aggressive violent or suicidal behavior on the unit.  Patient was treated for depression as well as for her medical problems.  She was continued on CPAP at night.  Patient mostly stayed to herself often feeling short of breath.  Did participate in some therapeutic groups and individual counseling.  At the time of discharge denies any suicidal ideation and feels more upbeat and positive.  She is being referred for outpatient substance abuse follow-up and is planning to stay with her sister.  Prescriptions and a supply of medicine being prescribed. ? ?Physical Findings: ?AIMS:  , ,  ,  ,    ?CIWA:    ?COWS:    ? ?  Musculoskeletal: ?Strength & Muscle Tone: within normal limits ?Gait & Station: normal ?Patient leans: N/A ? ? ?Psychiatric Specialty Exam: ? ?Presentation  ?General Appearance: Appropriate for Environment; Fairly Groomed ? ?Eye Contact:Fair ? ?Speech:Clear and Coherent ? ?Speech Volume:Normal ? ?Handedness:Right ? ? ?Mood and Affect  ?Mood:Hopeless; Dysphoric ? ?Affect:Congruent; Depressed ? ? ?Thought Process  ?Thought Processes:Coherent ? ?Descriptions of Associations:Intact ? ?Orientation:Full (Time, Place and Person) ? ?Thought Content:Rumination ? ?History of Schizophrenia/Schizoaffective disorder:No ? ?Duration of Psychotic  Symptoms:N/A ? ?Hallucinations:No data recorded ?Ideas of Reference:None ? ?Suicidal Thoughts:No data recorded ?Homicidal Thoughts:No data recorded ? ?Sensorium  ?Memory:Immediate Good; Recent Good; Remote Good ? ?Judgment:Impaired ? ?Insight:Lacking ? ? ?Executive Functions  ?Concentration:Fair ? ?Attention Span:Fair ? ?Recall:Good ? ?Fund of [place] ? ?Language:Good ? ? ?Psychomotor Activity  ?Psychomotor Activity:No data recorded ? ?Assets  ?Assets:Communication Skills; Desire for Improvement ? ? ?Sleep  ?Sleep:No data recorded ? ? ?Physical Exam: ?Physical Exam ?Vitals and nursing note reviewed.  ?Constitutional:   ?   Appearance: Normal appearance.  ?HENT:  ?   Head: Normocephalic and atraumatic.  ?   Mouth/Throat:  ?   Pharynx: Oropharynx is clear.  ?Eyes:  ?   Pupils: Pupils are equal, round, and reactive to light.  ?Cardiovascular:  ?   Rate and Rhythm: Normal rate and regular rhythm.  ?Pulmonary:  ?   Effort: Pulmonary effort is normal.  ?   Breath sounds: Normal breath sounds.  ?Abdominal:  ?   General: Abdomen is flat.  ?   Palpations: Abdomen is soft.  ?Musculoskeletal:     ?   General: Normal range of motion.  ?Skin: ?   General: Skin is warm and dry.  ?Neurological:  ?   General: No focal deficit present.  ?   Mental Status: She is alert. Mental status is at baseline.  ?Psychiatric:     ?   Mood and Affect: Mood normal.     ?   Thought Content: Thought content normal.  ? ?Review of Systems  ?Constitutional: Negative.   ?HENT: Negative.    ?Eyes: Negative.   ?Respiratory: Negative.    ?Cardiovascular: Negative.   ?Gastrointestinal: Negative.   ?Musculoskeletal: Negative.   ?Skin: Negative.   ?Neurological: Negative.   ?Psychiatric/Behavioral: Negative.    ?Blood pressure 120/77, pulse 91, temperature 98.8 ?F (37.1 ?C), temperature source Oral, resp. rate 18, height 5\' 6"  (1.676 m), weight 79.4 kg, last menstrual period 06/19/2018, SpO2 100 %. Body mass index is 28.25 kg/m?. ? ? ?Social History   ? ?Tobacco Use  ?Smoking Status Every Day  ? Packs/day: 1.00  ? Types: Cigarettes  ?Smokeless Tobacco Never  ? ?Tobacco Cessation:  A prescription for an FDA-approved tobacco cessation medication provided at discharge ? ? ?Blood Alcohol level:  ?Lab Results  ?Component Value Date  ? ETH <10 03/23/2021  ? ETH <10 09/21/2020  ? ? ?Metabolic Disorder Labs:  ?Lab Results  ?Component Value Date  ? HGBA1C 5.6 03/28/2021  ? MPG 114 03/28/2021  ? MPG 111 09/21/2020  ? ?No results found for: PROLACTIN ?Lab Results  ?Component Value Date  ? CHOL 149 03/28/2021  ? TRIG 200 (H) 03/28/2021  ? HDL 52 03/28/2021  ? CHOLHDL 2.9 03/28/2021  ? VLDL 40 03/28/2021  ? [place] 57 03/28/2021  ? [place] 56 09/21/2020  ? ? ?See Psychiatric Specialty Exam and Suicide Risk Assessment completed by Attending Physician prior to discharge. ? ?Discharge destination:  Home ? ?Is patient on multiple antipsychotic therapies at discharge:  No   ?Has Patient had three or more failed trials of antipsychotic monotherapy by history:  No ? ?Recommended Plan for Multiple Antipsychotic Therapies: ?NA ? ?Discharge Instructions   ? ? Diet - low sodium heart healthy   Complete by: As directed ?  ? Increase activity slowly   Complete by: As directed ?  ? ?  ? ?Allergies as of 04/08/2021   ?No Known Allergies ?  ? ?  ?Medication List  ?  ? ?STOP taking these medications   ? ?amLODipine 10 MG tablet ?Commonly known as: NORVASC ?  ?doxycycline 100 MG tablet ?Commonly known as: VIBRA-TABS ?  ?folic acid 1 MG tablet ?Commonly known as: FOLVITE ?  ?ibuprofen 200 MG tablet ?Commonly known as: ADVIL ?  ?nicotine 14 mg/24hr patch ?Commonly known as: NICODERM CQ - dosed in mg/24 hours ?Replaced by: nicotine 21 mg/24hr patch ?  ?NON FORMULARY ?  ?OXYGEN ?  ?predniSONE 10 MG (21) Tbpk tablet ?Commonly known as: STERAPRED UNI-PAK 21 TAB ?  ? ?  ? ?TAKE these medications   ? ?  Indication  ?albuterol 108 (90 Base) MCG/ACT inhaler ?Commonly known as: VENTOLIN HFA ?Inhale 2  puffs into the lungs every 4 (four) hours as needed for shortness of breath or wheezing. ?What changed:  ?when to take this ?reasons to take this ?Another medication with the same name was removed. Continue taking this medication, and

## 2021-04-08 NOTE — [facility] Suicide Risk Assessment (Signed)
[facility] Discharge Suicide Risk Assessment ? ? ?Principal Problem: Depression ?Discharge Diagnoses: Principal Problem: ?  Depression ?Active Problems: ?  Post traumatic stress disorder (PTSD) ?  Cocaine use disorder (HCC) ?  Essential hypertension ?  Asthma ?  Nicotine dependence, cigarettes, uncomplicated ? ? ?Total Time spent with patient: 30 minutes ? ?Musculoskeletal: ?Strength & Muscle Tone: within normal limits ?Gait & Station: normal ?Patient leans: N/A ? ?Psychiatric Specialty Exam ? ?Presentation  ?General Appearance: Appropriate for Environment; Fairly Groomed ? ?Eye Contact:Fair ? ?Speech:Clear and Coherent ? ?Speech Volume:Normal ? ?Handedness:Right ? ? ?Mood and Affect  ?Mood:Hopeless; Dysphoric ? ?Duration of Depression Symptoms: Greater than two weeks ? ?Affect:Congruent; Depressed ? ? ?Thought Process  ?Thought Processes:Coherent ? ?Descriptions of Associations:Intact ? ?Orientation:Full (Time, Place and Person) ? ?Thought Content:Rumination ? ?History of Schizophrenia/Schizoaffective disorder:No ? ?Duration of Psychotic Symptoms:N/A ? ?Hallucinations:No data recorded ?Ideas of Reference:None ? ?Suicidal Thoughts:No data recorded ?Homicidal Thoughts:No data recorded ? ?Sensorium  ?Memory:Immediate Good; Recent Good; Remote Good ? ?Judgment:Impaired ? ?Insight:Lacking ? ? ?Executive Functions  ?Concentration:Fair ? ?Attention Span:Fair ? ?Recall:Good ? ?Fund of Knowledge:Good ? ?Language:Good ? ? ?Psychomotor Activity  ?Psychomotor Activity:No data recorded ? ?Assets  ?Assets:Communication Skills; Desire for Improvement ? ? ?Sleep  ?Sleep:No data recorded ? ?Physical Exam: ?Physical Exam ?Vitals and nursing note reviewed.  ?Constitutional:   ?   Appearance: Normal appearance.  ?HENT:  ?   Head: Normocephalic and atraumatic.  ?   Mouth/Throat:  ?   Pharynx: Oropharynx is clear.  ?Eyes:  ?   Pupils: Pupils are equal, round, and reactive to light.  ?Cardiovascular:  ?   Rate and Rhythm: Normal rate and  regular rhythm.  ?Pulmonary:  ?   Effort: Pulmonary effort is normal.  ?   Breath sounds: Normal breath sounds.  ?Abdominal:  ?   General: Abdomen is flat.  ?   Palpations: Abdomen is soft.  ?Musculoskeletal:     ?   General: Normal range of motion.  ?Skin: ?   General: Skin is warm and dry.  ?Neurological:  ?   General: No focal deficit present.  ?   Mental Status: She is alert. Mental status is at baseline.  ?Psychiatric:     ?   Attention and Perception: Attention normal.     ?   Mood and Affect: Mood normal.     ?   Speech: Speech normal.     ?   Behavior: Behavior is cooperative.     ?   Thought Content: Thought content normal.     ?   Cognition and Memory: Cognition normal.  ? ?Review of Systems  ?Constitutional: Negative.   ?HENT: Negative.    ?Eyes: Negative.   ?Respiratory: Negative.    ?Cardiovascular: Negative.   ?Gastrointestinal: Negative.   ?Musculoskeletal: Negative.   ?Skin: Negative.   ?Neurological: Negative.   ?Psychiatric/Behavioral: Negative.    ?Blood pressure 120/77, pulse 91, temperature 98.8 ?F (37.1 ?C), temperature source Oral, resp. rate 18, height 5\' 6"  (1.676 m), weight 79.4 kg, last menstrual period 06/19/2018, SpO2 100 %. Body mass index is 28.25 kg/m?. ? ?Mental Status Per Nursing Assessment::   ?On Admission:  Suicidal ideation indicated by patient ? ?Demographic Factors:  ?Low socioeconomic status and Unemployed ? ?Loss Factors: ?Significant chronic medical problems as well as chronic homelessness ? ?Historical Factors: ?Impulsivity and history of recurrent substance abuse ? ?Risk Reduction Factors:   ?Positive social support ? ?Continued Clinical Symptoms:  ?Depression:   Impulsivity ?Alcohol/Substance  Abuse/Dependencies ? ?Cognitive Features That Contribute To Risk:  ?None   ? ?Suicide Risk:  ?Minimal: No identifiable suicidal ideation.  Patients presenting with no risk factors but with morbid ruminations; may be classified as minimal risk based on the severity of the depressive  symptoms ? ? Follow-up Information   ? ? [facility]. Go to.   ?Specialty: Behavioral Health ?Why: Please present for new patient walk in clinic at 0730 AM. Clinic operates on a first come first serve basis and begins seeing patients at 0800AM. ?Contact information: ?[address] ?[place] [address] ?[phone] ? ?  ?  ? ?  ?  ? ?  ? ? ?Plan Of Care/Follow-up recommendations:  ?Patient is currently denying suicidal ideation and has not displayed any dangerous behavior in the hospital.  Affect is upbeat and agreeable to outpatient treatment back in [place] with continued medication therapy and substance abuse treatment ? ?Mordecai Rasmussen, MD ?04/08/2021, 10:23 AM ?

## 2021-04-08 NOTE — Progress Notes (Signed)
Patient is pleasant and cooperative. She is med compliant and received medication without incident.  She has been isolative to her room, but does come out for snack. She denies si  hi avh depression and anxiety at this encounter.  Will continue to monitor with q 15 minute safety checks.   ? ? ? ?C Butler-Nicholson, LPN ?

## 2021-04-08 NOTE — Progress Notes (Signed)
Recreation Therapy Notes ? ?INPATIENT RECREATION TR PLAN ? ?Patient Details ?Name: Rachel Nolan ?MRN: [id number] ?DOB: [birth year] ?Today's Date: 04/08/2021 ? ?Rec Therapy Plan ?Is patient appropriate for Therapeutic Recreation?: Yes ?Treatment times per week: at least 3 ?Estimated Length of Stay: 5-7 days ?TR Treatment/Interventions: Group participation (Comment) ? ?Discharge Criteria ?Pt will be discharged from therapy if:: Discharged ?Treatment plan/goals/alternatives discussed and agreed upon by:: Patient/family ? ?Discharge Summary ?Short term goals set: Patient will engage in groups without prompting or encouragement from LRT x3 group sessions within 5 recreation therapy group sessions ?Short term goals met: Not met ?Reason goals not met: Patient spent her time in the bed ?Therapeutic equipment acquired: N/A ?Reason patient discharged from therapy: Discharge from hospital ?Pt/family agrees with progress & goals achieved: Yes ?Date patient discharged from therapy: 04/08/21 ? ? ?Aryam Zhan ?04/08/2021, 12:39 PM ?

## 2021-04-08 NOTE — Progress Notes (Addendum)
D: Pt alert and oriented. Pt denies experiencing any pain, SI/HI, or AVH at this time. Pt reports she will be able to keep themselves safe when she returns home. ?  ?A: Pt received discharge, hard copy of prescriptions, medication samples and medication education/information. Pt belongings were returned and signed for at this time to include printed prescriptions and valuables.  ?  ?R: Pt verbalized understanding of discharge and medication education/information. ?  ?Pt escorted by staff to the medical mall front lobby where patient's friend picked her up and transported to her to her sister's house.     ?

## 2021-04-08 NOTE — Progress Notes (Signed)
D: Pt denies SI/HI/AVH  anxiety and depression and pain. ?A: Pt provided support and encouragement. Pt given medication per protocol and standing orders. Q29m safety checks implemented and continued.  ?R: Pt safe on the unit. Will continue to monitor.  ?

## 2021-04-08 NOTE — Plan of Care (Signed)
?  Problem: Education: Goal: Knowledge of [facility] Education information/materials will improve Outcome: Adequate for Discharge Goal: Emotional status will improve Outcome: Adequate for Discharge Goal: Mental status will improve Outcome: Adequate for Discharge Goal: Verbalization of understanding the information provided will improve Outcome: Adequate for Discharge   Problem: Activity: Goal: Interest or engagement in activities will improve Outcome: Adequate for Discharge Goal: Sleeping patterns will improve Outcome: Adequate for Discharge   Problem: Coping: Goal: Ability to verbalize frustrations and anger appropriately will improve Outcome: Adequate for Discharge Goal: Ability to demonstrate self-control will improve Outcome: Adequate for Discharge   Problem: Health Behavior/Discharge Planning: Goal: Identification of resources available to assist in meeting health care needs will improve Outcome: Adequate for Discharge Goal: Compliance with treatment plan for underlying cause of condition will improve Outcome: Adequate for Discharge   Problem: Physical Regulation: Goal: Ability to maintain clinical measurements within normal limits will improve Outcome: Adequate for Discharge   Problem: Safety: Goal: Periods of time without injury will increase Outcome: Adequate for Discharge   Problem: Education: Goal: Utilization of techniques to improve thought processes will improve Outcome: Adequate for Discharge Goal: Knowledge of the prescribed therapeutic regimen will improve Outcome: Adequate for Discharge   Problem: Activity: Goal: Interest or engagement in leisure activities will improve Outcome: Adequate for Discharge Goal: Imbalance in normal sleep/wake cycle will improve Outcome: Adequate for Discharge   Problem: Coping: Goal: Coping ability will improve Outcome: Adequate for Discharge Goal: Will verbalize feelings Outcome: Adequate for Discharge    Problem: Health Behavior/Discharge Planning: Goal: Ability to make decisions will improve Outcome: Adequate for Discharge Goal: Compliance with therapeutic regimen will improve Outcome: Adequate for Discharge   Problem: Role Relationship: Goal: Will demonstrate positive changes in social behaviors and relationships Outcome: Adequate for Discharge   Problem: Safety: Goal: Ability to disclose and discuss suicidal ideas will improve Outcome: Adequate for Discharge Goal: Ability to identify and utilize support systems that promote safety will improve Outcome: Adequate for Discharge   Problem: Self-Concept: Goal: Will verbalize positive feelings about self Outcome: Adequate for Discharge Goal: Level of anxiety will decrease Outcome: Adequate for Discharge   

## 2021-04-08 NOTE — Progress Notes (Signed)
Recreation Therapy Notes ? ?Date: 04/08/2021 ? ?Time: 11:05 am  ? ?Location: Craft room  ? ?Behavioral response: N/A ? ?Intervention Topic: Stress Management  ? ?Discussion/Intervention: ?Patient refused to attend group. ? ?Clinical Observations/Feedback: ?Patient refused to attend group. ? ?Jaydee Ingman LRT/CTRS ? ? ? ? ? ? ? ?Journee Bobrowski ?04/08/2021 12:22 PM ?

## 2021-04-08 NOTE — Progress Notes (Signed)
?  [facility] Adult Case Management Discharge Plan : ? ?Will you be returning to the same living situation after discharge:  No. Patient to discharge to her older sisters home at discharge.  ? ?At discharge, do you have transportation home?: Yes,  Friend to assist with transportation from hospital.  ? ?Do you have the ability to pay for your medications: No. Patient to be supplied with 7-day supply of medication.  ? ?Release of information consent forms completed and in the chart;  Patient's signature needed at discharge. ? ?Patient to Follow up at: ? ? Follow-up Information   ? ? [facility]. Go to.   ?Specialty: Behavioral Health ?Why: Please present for new patient walk in clinic at 0730 AM. Clinic operates on a first come first serve basis and begins seeing patients at 0800AM. ?Contact information: ?[address] ?[place] [address] ?[phone] ? ?  ?  ? ?  ?  ? ?  ?  ? ?Next level of care provider has access to [facility] Link:no ? ?Safety Planning and Suicide Prevention discussed: No. SPE completed with patient, Patient declined consent for CSW to reach collateral contact.   ?  ?Has patient been referred to the Quitline?: Yes, faxed on 04/08/21 ? ?Patient has been referred for addiction treatment: Yes ? ?Corky Crafts, LCSWA ?04/08/2021, 9:17 AM ?

## 2021-04-20 ENCOUNTER — Encounter (HOSPITAL_COMMUNITY): Payer: Self-pay | Admitting: Pharmacy Technician

## 2021-04-20 ENCOUNTER — Emergency Department (HOSPITAL_COMMUNITY): Payer: Self-pay

## 2021-04-20 ENCOUNTER — Inpatient Hospital Stay (HOSPITAL_COMMUNITY)
Admission: EM | Admit: 2021-04-20 | Discharge: 2021-04-22 | DRG: 190 | Discharge status: Against Medical Advice | Payer: Self-pay | Attending: Internal Medicine | Admitting: Internal Medicine

## 2021-04-20 ENCOUNTER — Other Ambulatory Visit: Payer: Self-pay

## 2021-04-20 DIAGNOSIS — F17210 Nicotine dependence, cigarettes, uncomplicated: Secondary | ICD-10-CM | POA: Diagnosis present

## 2021-04-20 DIAGNOSIS — Z79899 Other long term (current) drug therapy: Secondary | ICD-10-CM

## 2021-04-20 DIAGNOSIS — J9621 Acute and chronic respiratory failure with hypoxia: Secondary | ICD-10-CM | POA: Diagnosis present

## 2021-04-20 DIAGNOSIS — R0603 Acute respiratory distress: Secondary | ICD-10-CM

## 2021-04-20 DIAGNOSIS — J9611 Chronic respiratory failure with hypoxia: Secondary | ICD-10-CM

## 2021-04-20 DIAGNOSIS — Z8249 Family history of ischemic heart disease and other diseases of the circulatory system: Secondary | ICD-10-CM

## 2021-04-20 DIAGNOSIS — F419 Anxiety disorder, unspecified: Secondary | ICD-10-CM

## 2021-04-20 DIAGNOSIS — F4310 Post-traumatic stress disorder, unspecified: Secondary | ICD-10-CM | POA: Diagnosis present

## 2021-04-20 DIAGNOSIS — I10 Essential (primary) hypertension: Secondary | ICD-10-CM | POA: Diagnosis present

## 2021-04-20 DIAGNOSIS — F32A Depression, unspecified: Secondary | ICD-10-CM | POA: Diagnosis present

## 2021-04-20 DIAGNOSIS — J45901 Unspecified asthma with (acute) exacerbation: Secondary | ICD-10-CM | POA: Diagnosis present

## 2021-04-20 DIAGNOSIS — Z720 Tobacco use: Secondary | ICD-10-CM | POA: Diagnosis present

## 2021-04-20 DIAGNOSIS — Z9981 Dependence on supplemental oxygen: Secondary | ICD-10-CM

## 2021-04-20 DIAGNOSIS — Z20822 Contact with and (suspected) exposure to covid-19: Secondary | ICD-10-CM | POA: Diagnosis present

## 2021-04-20 DIAGNOSIS — F1911 Other psychoactive substance abuse, in remission: Secondary | ICD-10-CM

## 2021-04-20 DIAGNOSIS — J441 Chronic obstructive pulmonary disease with (acute) exacerbation: Principal | ICD-10-CM | POA: Diagnosis present

## 2021-04-20 DIAGNOSIS — F1490 Cocaine use, unspecified, uncomplicated: Secondary | ICD-10-CM | POA: Diagnosis present

## 2021-04-20 DIAGNOSIS — R0602 Shortness of breath: Secondary | ICD-10-CM

## 2021-04-20 LAB — CBC WITH DIFFERENTIAL/PLATELET
Abs Immature Granulocytes: 0.02 10*3/uL (ref 0.00–0.07)
Basophils Absolute: 0 10*3/uL (ref 0.0–0.1)
Basophils Relative: 0 %
Eosinophils Absolute: 0.3 10*3/uL (ref 0.0–0.5)
Eosinophils Relative: 5 %
HCT: 37.8 % (ref 36.0–46.0)
Hemoglobin: 12.5 g/dL (ref 12.0–15.0)
Immature Granulocytes: 0 %
Lymphocytes Relative: 36 %
Lymphs Abs: 2.4 10*3/uL (ref 0.7–4.0)
MCH: 31.3 pg (ref 26.0–34.0)
MCHC: 33.1 g/dL (ref 30.0–36.0)
MCV: 94.7 fL (ref 80.0–100.0)
Monocytes Absolute: 0.8 10*3/uL (ref 0.1–1.0)
Monocytes Relative: 12 %
Neutro Abs: 3 10*3/uL (ref 1.7–7.7)
Neutrophils Relative %: 47 %
Platelets: 286 10*3/uL (ref 150–400)
RBC: 3.99 MIL/uL (ref 3.87–5.11)
RDW: 15.3 % (ref 11.5–15.5)
WBC: 6.6 10*3/uL (ref 4.0–10.5)
nRBC: 0 % (ref 0.0–0.2)

## 2021-04-20 LAB — COMPREHENSIVE METABOLIC PANEL
ALT: 21 U/L (ref 0–44)
AST: 32 U/L (ref 15–41)
Albumin: 4 g/dL (ref 3.5–5.0)
Alkaline Phosphatase: 76 U/L (ref 38–126)
Anion gap: 9 (ref 5–15)
BUN: 9 mg/dL (ref 6–20)
CO2: 25 mmol/L (ref 22–32)
Calcium: 9.1 mg/dL (ref 8.9–10.3)
Chloride: 104 mmol/L (ref 98–111)
Creatinine, Ser: 0.68 mg/dL (ref 0.44–1.00)
GFR, Estimated: >60 mL/min (ref >60)
Glucose, Bld: 82 mg/dL (ref 70–99)
Potassium: 3.8 mmol/L (ref 3.5–5.1)
Sodium: 138 mmol/L (ref 135–145)
Total Bilirubin: 0.4 mg/dL (ref 0.3–1.2)
Total Protein: 7.4 g/dL (ref 6.5–8.1)

## 2021-04-20 LAB — I-STAT VENOUS BLOOD GAS, ED
Acid-Base Excess: 1 mmol/L (ref 0.0–2.0)
Bicarbonate: 27.3 mmol/L (ref 20.0–28.0)
Calcium, Ion: 1.16 mmol/L (ref 1.15–1.40)
HCT: 39 % (ref 36.0–46.0)
Hemoglobin: 13.3 g/dL (ref 12.0–15.0)
O2 Saturation: 80 %
Potassium: 3.9 mmol/L (ref 3.5–5.1)
Sodium: 139 mmol/L (ref 135–145)
TCO2: 29 mmol/L (ref 22–32)
pCO2, Ven: 51.4 mmHg (ref 44–60)
pH, Ven: 7.333 (ref 7.25–7.43)
pO2, Ven: 48 mmHg — ABNORMAL HIGH (ref 32–45)

## 2021-04-20 LAB — BRAIN NATRIURETIC PEPTIDE: B Natriuretic Peptide: 13.8 pg/mL (ref 0.0–100.0)

## 2021-04-20 MED ORDER — NICOTINE 21 MG/24HR TD PT24
21.0000 mg | MEDICATED_PATCH | Freq: Every day | TRANSDERMAL | Status: DC
  Administered 2021-04-21: 21 mg via TRANSDERMAL
  Filled 2021-04-20 (×2): qty 1

## 2021-04-20 MED ORDER — IPRATROPIUM-ALBUTEROL 0.5-2.5 (3) MG/3ML IN SOLN: 3.0000 mL | RESPIRATORY_TRACT | Status: DC

## 2021-04-20 MED ORDER — HYDROXYZINE HCL 25 MG PO TABS: 50.0000 mg | ORAL_TABLET | Freq: Three times a day (TID) | ORAL | Status: DC | PRN

## 2021-04-20 MED ORDER — ALBUTEROL SULFATE (2.5 MG/3ML) 0.083% IN NEBU
7.5000 mg | INHALATION_SOLUTION | RESPIRATORY_TRACT | Status: DC
  Administered 2021-04-20: 7.5 mg via RESPIRATORY_TRACT
  Filled 2021-04-20: qty 9

## 2021-04-20 MED ORDER — GABAPENTIN 100 MG PO CAPS
200.0000 mg | ORAL_CAPSULE | Freq: Three times a day (TID) | ORAL | Status: DC
  Administered 2021-04-21 – 2021-04-22 (×6): 200 mg via ORAL
  Filled 2021-04-20 (×6): qty 2

## 2021-04-20 MED ORDER — ALBUTEROL SULFATE (2.5 MG/3ML) 0.083% IN NEBU: 2.5000 mg | INHALATION_SOLUTION | RESPIRATORY_TRACT | Status: DC | PRN

## 2021-04-20 MED ORDER — ACETAMINOPHEN 325 MG PO TABS
650.0000 mg | ORAL_TABLET | Freq: Four times a day (QID) | ORAL | Status: DC | PRN
Start: 2021-04-20 — End: 2021-04-22

## 2021-04-20 MED ORDER — FLUOXETINE HCL 20 MG PO CAPS
40.0000 mg | ORAL_CAPSULE | Freq: Every morning | ORAL | Status: DC
  Administered 2021-04-21 – 2021-04-22 (×2): 40 mg via ORAL
  Filled 2021-04-20 (×2): qty 2

## 2021-04-20 MED ORDER — ALBUTEROL (5 MG/ML) CONTINUOUS INHALATION SOLN
7.5000 mg/h | INHALATION_SOLUTION | Freq: Once | RESPIRATORY_TRACT | Status: DC
  Filled 2021-04-20: qty 0.5

## 2021-04-20 MED ORDER — IPRATROPIUM-ALBUTEROL 0.5-2.5 (3) MG/3ML IN SOLN
3.0000 mL | Freq: Four times a day (QID) | RESPIRATORY_TRACT | Status: DC
  Administered 2021-04-21 – 2021-04-22 (×7): 3 mL via RESPIRATORY_TRACT
  Filled 2021-04-20 (×7): qty 3

## 2021-04-20 MED ORDER — METHYLPREDNISOLONE SODIUM SUCC 125 MG IJ SOLR
80.0000 mg | Freq: Two times a day (BID) | INTRAMUSCULAR | Status: DC
  Administered 2021-04-21 – 2021-04-22 (×4): 80 mg via INTRAVENOUS
  Filled 2021-04-20 (×4): qty 2

## 2021-04-20 MED ORDER — QUETIAPINE FUMARATE 100 MG PO TABS
100.0000 mg | ORAL_TABLET | Freq: Every day | ORAL | Status: DC
  Administered 2021-04-21 (×2): 100 mg via ORAL
  Filled 2021-04-20 (×3): qty 1

## 2021-04-20 MED ORDER — IPRATROPIUM-ALBUTEROL 0.5-2.5 (3) MG/3ML IN SOLN
3.0000 mL | Freq: Once | RESPIRATORY_TRACT | Status: AC
  Administered 2021-04-20: 3 mL via RESPIRATORY_TRACT
  Filled 2021-04-20: qty 3

## 2021-04-20 MED ORDER — MAGNESIUM SULFATE 2 GM/50ML IV SOLN
2.0000 g | Freq: Once | INTRAVENOUS | Status: AC
  Administered 2021-04-20: 2 g via INTRAVENOUS
  Filled 2021-04-20: qty 50

## 2021-04-20 MED ORDER — BUDESONIDE 0.5 MG/2ML IN SUSP
2.0000 mg | Freq: Two times a day (BID) | RESPIRATORY_TRACT | Status: DC
  Administered 2021-04-21: 2 mg via RESPIRATORY_TRACT
  Administered 2021-04-22: 0.5 mg via RESPIRATORY_TRACT
  Filled 2021-04-20 (×2): qty 8

## 2021-04-20 MED ORDER — ACETAMINOPHEN 650 MG RE SUPP: 650.0000 mg | Freq: Four times a day (QID) | RECTAL | Status: DC | PRN

## 2021-04-20 MED ORDER — ENOXAPARIN SODIUM 40 MG/0.4ML IJ SOSY
40.0000 mg | PREFILLED_SYRINGE | INTRAMUSCULAR | Status: DC
  Administered 2021-04-21 – 2021-04-22 (×2): 40 mg via SUBCUTANEOUS
  Filled 2021-04-20 (×2): qty 0.4

## 2021-04-20 MED ORDER — MONTELUKAST SODIUM 10 MG PO TABS
10.0000 mg | ORAL_TABLET | Freq: Every day | ORAL | Status: DC
  Administered 2021-04-21 (×2): 10 mg via ORAL
  Filled 2021-04-20 (×3): qty 1

## 2021-04-20 MED ORDER — PRAZOSIN HCL 1 MG PO CAPS
1.0000 mg | ORAL_CAPSULE | Freq: Every day | ORAL | Status: DC
  Administered 2021-04-21 (×2): 1 mg via ORAL
  Filled 2021-04-20 (×3): qty 1

## 2021-04-20 NOTE — Assessment & Plan Note (Signed)
Stable. ?-Continue home prazosin ?

## 2021-04-20 NOTE — ED Provider Notes (Signed)
?[facility] ?Provider Note ? ? ?CSN: [id number] ?Arrival date & time: 04/20/21  1835 ? ?  ? ?History ?Chief Complaint  ?Patient presents with  ? Respiratory Distress  ? ? ?Rachel Nolan is a 51 y.o. female with history of COPD on 4 L at baseline presenting to the ED for worsening shortness of breath.  Patient called EMS because she was having trouble breathing.  Patient reports that started a few days ago but got a lot worse today.  On arrival, EMS said patient was anxious, agitated, and speaking 1 word phrases with difficulty.  She seemed sleepy and had very tight lung sounds.  Patient was given 125 mg of Solu-Medrol, 5 mg albuterol, and Atrovent in route with significant improvement.  Patient is on 100% nebulizing treatment.  The lowest patient's oxygen saturation was on arrival was 88%.  Her vital signs have been stable in route.  Her tachypnea is improved and she is now able to speak sentences.  Lung sounds are reported to be diffuse wheezing.  ? ?HPI ? ?  ? ?Home Medications ?Prior to Admission medications   ?Medication Sig Start Date End Date Taking? Authorizing Provider  ?albuterol (VENTOLIN HFA) 108 (90 Base) MCG/ACT inhaler Inhale 2 puffs into the lungs every 4 (four) hours as needed for shortness of breath or wheezing. 04/08/21   Clapacs, Jackquline DenmarkJohn T, MD  ?FLUoxetine (PROZAC) 40 MG capsule Take 1 capsule (40 mg total) by mouth every morning. 04/08/21   Clapacs, Jackquline DenmarkJohn T, MD  ?gabapentin (NEURONTIN) 100 MG capsule Take 2 capsules (200 mg total) by mouth 3 (three) times daily. 04/08/21   Clapacs, Jackquline DenmarkJohn T, MD  ?hydrOXYzine (ATARAX) 50 MG tablet Take 1 tablet (50 mg total) by mouth 3 (three) times daily as needed for anxiety. 04/08/21   Clapacs, Jackquline DenmarkJohn T, MD  ?montelukast (SINGULAIR) 10 MG tablet Take 1 tablet (10 mg total) by mouth at bedtime. 04/08/21   Clapacs, Jackquline DenmarkJohn T, MD  ?nicotine (NICODERM CQ - DOSED IN MG/24 HOURS) 21 mg/24hr patch Place 1 patch (21 mg total) onto the skin daily.  04/08/21   Clapacs, Jackquline DenmarkJohn T, MD  ?prazosin (MINIPRESS) 1 MG capsule Take 1 capsule (1 mg total) by mouth at bedtime. 04/08/21   Clapacs, Jackquline DenmarkJohn T, MD  ?QUEtiapine (SEROQUEL) 100 MG tablet Take 1 tablet (100 mg total) by mouth at bedtime. 04/08/21   Clapacs, Jackquline DenmarkJohn T, MD  ?traZODone (DESYREL) 150 MG tablet Take 1 tablet (150 mg total) by mouth at bedtime as needed for sleep. 04/08/21   Clapacs, Jackquline DenmarkJohn T, MD  ?citalopram (CELEXA) 20 MG tablet Take 1 tablet (20 mg total) by mouth daily. 03/07/19 07/23/19  Aldean BakerSykes, Janet E, NP  ?lisinopril (ZESTRIL) 10 MG tablet Take 1 tablet (10 mg total) by mouth daily. 03/07/19 09/11/19  Aldean BakerSykes, Janet E, NP  ?pantoprazole (PROTONIX) 40 MG tablet Take 1 tablet (40 mg total) by mouth daily. 03/07/19 07/23/19  Aldean BakerSykes, Janet E, NP  ?   ? ?Allergies    ?Patient has no known allergies.   ? ?Review of Systems   ?Review of Systems  ?Constitutional:  Negative for chills and fever.  ?Respiratory:  Positive for shortness of breath and wheezing. Negative for cough.   ?Cardiovascular:  Negative for chest pain, palpitations and leg swelling.  ?Skin:  Negative for rash and wound.  ?Psychiatric/Behavioral:  Positive for agitation. The patient is nervous/anxious.   ? ?Physical Exam ?Updated Vital Signs ?BP (!) 131/93   Pulse 70  Temp (!) 97.3 ?F (36.3 ?C) (Axillary)   Resp 19   LMP 06/19/2018 (Exact Date)   SpO2 100%  ?Physical Exam ?Vitals and nursing note reviewed.  ?Constitutional:   ?   General: She is in acute distress.  ?   Appearance: She is obese.  ?HENT:  ?   Head: Normocephalic and atraumatic.  ?Cardiovascular:  ?   Rate and Rhythm: Normal rate and regular rhythm.  ?Pulmonary:  ?   Breath sounds: Wheezing present.  ?   Comments: Tachypnea ?Diffuse wheezing ?Abdominal:  ?   Tenderness: There is no abdominal tenderness. There is no guarding or rebound.  ?Musculoskeletal:  ?   Right lower leg: No edema.  ?   Left lower leg: No edema.  ?Neurological:  ?   Mental Status: She is alert and oriented to person,  place, and time.  ?Psychiatric:     ?   Mood and Affect: Mood normal.  ? ? ?ED Results / Procedures / Treatments   ?Labs ?(all labs ordered are listed, but only abnormal results are displayed) ?Labs Reviewed  ?I-STAT VENOUS BLOOD GAS, ED - Abnormal; Notable for the following components:  ?    Result Value  ? pO2, Ven 48 (*)   ? All other components within normal limits  ?CBC WITH DIFFERENTIAL/PLATELET  ?BRAIN NATRIURETIC PEPTIDE  ?COMPREHENSIVE METABOLIC PANEL  ? ? ?EKG ?None ? ?Radiology ?DG Chest Port 1 View ? ?Result Date: 04/20/2021 ?CLINICAL DATA:  Shortness of breath.  Respiratory distress. EXAM: PORTABLE CHEST 1 VIEW COMPARISON:  03/23/2021 FINDINGS: Heart size and pulmonary vascularity are normal. Lungs are clear. No pleural effusions. No pneumothorax. Mediastinal contours appear intact. Thoracolumbar scoliosis with fixation rod. IMPRESSION: No active disease. Electronically Signed   By: Burman Nieves M.D.   On: 04/20/2021 19:05   ? ?Procedures ?Procedures  ? ? ?Medications Ordered in ED ?Medications  ?magnesium sulfate IVPB 2 g 50 mL (2 g Intravenous New Bag/Given 04/20/21 1900)  ?albuterol (PROVENTIL) (2.5 MG/3ML) 0.083% nebulizer solution 7.5 mg (7.5 mg Nebulization Not Given 04/20/21 1919)  ?ipratropium-albuterol (DUONEB) 0.5-2.5 (3) MG/3ML nebulizer solution 3 mL (3 mLs Nebulization Given 04/20/21 1850)  ? ? ?ED Course/ Medical Decision Making/ A&P ?Clinical Course as of 04/20/21 1920  ?Mon Apr 20, 2021  ?1906 CXR similar to prior on my review.  [RK]  ?  ?Clinical Course User Index ?[RK] Micheline Maze, MD  ? ?                        ?Medical Decision Making ?Amount and/or Complexity of Data Reviewed ?Labs: ordered. ?Radiology: ordered. ? ?Risk ?Prescription drug management. ?Decision regarding hospitalization. ? ? ?Exam, diffuse wheezing with tachypnea.  Able to speak in phrases with difficulty.  Placed on BiPAP to assist with work of breathing with an inline continuous albuterol treatment.  Patient reports  using CPAP nightly and thinks that would help her at this time.  Magnesium given.  Solu-Medrol and treatments given prior to arrival.  Chest x-ray with no focal findings on my review which is confirmed by radiology.  Blood gas within normal limits.  ? ?On chart review, patient has a history of depression, anxiety, PTSD, hypertension, tobacco use, and substance abuse.  Patient is prescribed fluoxetine, hydroxyzine, prazosin, quetiapine, trazodone.  Substance use includes marijuana, cocaine, heroin.  Patient was admitted for passive SI from 3/11 - 04/08/2021.  ? ?Patient showed significant improvement after BiPAP and continuous albuterol.  CBC, CMP, BMP  within normal limits.  Chest x-ray my review shows no focal findings which is confirmed by radiology.  Patient still has significant amount of wheezing.  Case discussed with hospitalist who agrees with admission for COPD exacerbation at this time. ? ?Patient seen conjunction with my attending Dr. Jodi Mourning. ? ?Final Clinical Impression(s) / ED Diagnoses ?Final diagnoses:  ?COPD exacerbation (HCC)  ?Respiratory distress  ?Shortness of breath  ? ? ?Rx / DC Orders ?ED Discharge Orders   ? ? None  ? ?  ? ? ?  ?Micheline Maze, MD ?04/21/21 0050 ? ?  ?Blane Ohara, MD ?04/21/21 [phone] ? ?

## 2021-04-20 NOTE — Progress Notes (Signed)
RT removed patient from BIPAP and placed on 4L [place] per verbal order at this time. No respiratory distress noted. Expiratory wheezes noted. BIPAP is at bedside on standby if needed. RT will continue to monitor as needed. ?

## 2021-04-20 NOTE — ED Notes (Signed)
Pt placed on BiPAP

## 2021-04-20 NOTE — Assessment & Plan Note (Addendum)
Recently admitted to behavioral health 3/11-3/22 for passive suicidal ideation in the setting of substance abuse and poor outpatient support.  Not endorsing any suicidal thoughts at this time. ?-EKG showing QTc 491, was 467 on previous EKG done 03/23/2021.  Continue home psychotropic medications.  Avoid any other QT prolonging drugs and repeat EKG in the morning.  Monitor potassium and magnesium levels. ?

## 2021-04-20 NOTE — Assessment & Plan Note (Signed)
UDS

## 2021-04-20 NOTE — ED Triage Notes (Signed)
Pt here with reports of respiratory distress via ems from home. Feeling shob for 2 days. Reports compliant with nebs. On arrival on scene, agitated, anxious and unable to speak. Given albuterol with improvement. Given atrovent en route as well. Now moving air in all lung fields. Given 125mg  solumedrol en route. Total 5mg  albuterol and 0.5mg  atrovent en route.  ?88% on 3L [place] baseline ?Placed on Neb at 100%.  ?156/82 ?RR 30 ? ?

## 2021-04-20 NOTE — ED Notes (Signed)
Per provider, pt weaned from BiPAP by RT and is currently placed on 4L [place] and tolerating well. Pt will be continually monitored for signs of distress or decompensation.  ?

## 2021-04-20 NOTE — Assessment & Plan Note (Signed)
Acute exacerbation of asthma/COPD ?SPO2 88% on 3 L home oxygen, wheezing, and very tachypneic on arrival to the ED.  Initially required BiPAP but work of breathing has now improved after bronchodilator treatments, Solu-Medrol, and IV magnesium.  Currently satting well on 4 L supplemental oxygen but still wheezing. ?-Solu-Medrol 80 mg every 12 hours ?-DuoNeb every 6 hours ?-Albuterol neb every 2 hours as needed ?-Pulmicort neb twice daily ?-Continue Singulair ?-Do not think antibiotics are indicated at this time as patient is not endorsing increased sputum volume or increased sputum purulence.  No fever or leukocytosis.  Chest x-ray without signs of infection. ?-Continuous pulse ox ?-Continue supplemental oxygen ?-COVID and influenza PCR pending ?

## 2021-04-20 NOTE — H&P (Signed)
?History and Physical  ? ? ?[name] [id number] DOB: [birth year] DOA: 04/20/2021 ? ?PCP: Pcp, No ? ?Patient coming from: Home ? ?Chief Complaint: Shortness of breath ? ?HPI: Rachel Nolan is a 51 y.o. female with medical history significant of asthma, COPD, chronic hypoxemic respiratory failure on home oxygen, anxiety, depression, PTSD, substance abuse, hypertension presented to the ED via EMS for evaluation of respiratory distress.  Albuterol, Atrovent, and Solu-Medrol 125 mg in route.  SPO2 88% on 3 L home oxygen, wheezing, and very tachypneic on arrival to the ED and was placed on BiPAP.  She was given DuoNeb, continuous albuterol neb treatment, and IV magnesium 2 g.  Work of breathing subsequently improved and transitioned to 4 L [place].  No fever or leukocytosis.  BNP normal.  VBG with pH 7.33, PCO2 51.  COVID and influenza PCR pending.  Chest x-ray showing no active disease.  EKG without acute ischemic changes. ? ?Patient is a poor historian.  Reports 1 day history of shortness of breath, nonproductive cough, and wheezing.  Denies fevers or chest pain.  She uses albuterol at home as needed and is not on any other inhalers.  States her breathing has now improved compared to what it was like when she came into the emergency room.  No other complaints. ? ?Review of Systems:  ?Review of Systems  ?All other systems reviewed and are negative. ? ?Past Medical History:  ?Diagnosis Date  ? Anxiety   ? Asthma   ? COPD (chronic obstructive pulmonary disease) ([place])   ? Depression   ? Hypertension   ? Scoliosis   ? ? ?Past Surgical History:  ?Procedure Laterality Date  ? BACK SURGERY    ? ECTOPIC PREGNANCY SURGERY    ? ? ? reports that she has been smoking cigarettes. She has been smoking an average of 1 pack per day. She has never used smokeless tobacco. She reports current alcohol use. She reports current drug use. Drugs: Marijuana, Cocaine, "Crack" cocaine, and Heroin. ? ?No Known Allergies ? ?Family History   ?Problem Relation Age of Onset  ? Hypertension Mother   ? Hypertension Father   ? ? ?Prior to Admission medications   ?Medication Sig Start Date End Date Taking? Authorizing Provider  ?albuterol (VENTOLIN HFA) 108 (90 Base) MCG/ACT inhaler Inhale 2 puffs into the lungs every 4 (four) hours as needed for shortness of breath or wheezing. 04/08/21  Yes Clapacs, Madie Reno, MD  ?FLUoxetine (PROZAC) 40 MG capsule Take 1 capsule (40 mg total) by mouth every morning. 04/08/21  Yes Clapacs, Madie Reno, MD  ?gabapentin (NEURONTIN) 100 MG capsule Take 2 capsules (200 mg total) by mouth 3 (three) times daily. 04/08/21  Yes Clapacs, Madie Reno, MD  ?hydrOXYzine (ATARAX) 50 MG tablet Take 1 tablet (50 mg total) by mouth 3 (three) times daily as needed for anxiety. 04/08/21  Yes Clapacs, Madie Reno, MD  ?ibuprofen (ADVIL) 200 MG tablet Take 400 mg by mouth every 6 (six) hours as needed for headache or moderate pain.   Yes [provider]  ?montelukast (SINGULAIR) 10 MG tablet Take 1 tablet (10 mg total) by mouth at bedtime. 04/08/21  Yes Clapacs, Madie Reno, MD  ?nicotine (NICODERM CQ - DOSED IN MG/24 HOURS) 21 mg/24hr patch Place 1 patch (21 mg total) onto the skin daily. 04/08/21  Yes Clapacs, Madie Reno, MD  ?NON FORMULARY CPAP at bedtime   Yes [provider]  ?OXYGEN Inhale 2 L into the lungs as needed (shortness of  breath).   Yes [provider]  ?prazosin (MINIPRESS) 1 MG capsule Take 1 capsule (1 mg total) by mouth at bedtime. 04/08/21  Yes Clapacs, Madie Reno, MD  ?QUEtiapine (SEROQUEL) 100 MG tablet Take 1 tablet (100 mg total) by mouth at bedtime. 04/08/21  Yes Clapacs, Madie Reno, MD  ?traZODone (DESYREL) 150 MG tablet Take 1 tablet (150 mg total) by mouth at bedtime as needed for sleep. 04/08/21  Yes Clapacs, Madie Reno, MD  ?citalopram (CELEXA) 20 MG tablet Take 1 tablet (20 mg total) by mouth daily. 03/07/19 07/23/19  Rachel Burkitt, NP  ?lisinopril (ZESTRIL) 10 MG tablet Take 1 tablet (10 mg total) by mouth daily. 03/07/19 09/11/19   Rachel Burkitt, NP  ?pantoprazole (PROTONIX) 40 MG tablet Take 1 tablet (40 mg total) by mouth daily. 03/07/19 07/23/19  Rachel Burkitt, NP  ? ? ?Physical Exam: ?Vitals:  ? 04/20/21 2129 04/20/21 2130 04/20/21 2145 04/20/21 2303  ?BP: 126/89 (!) 122/93 (!) 127/95   ?Pulse: 69 71 92   ?Resp: 18 17 16    ?Temp:      ?TempSrc:      ?SpO2:  96% 98% 97%  ? ? ?Physical Exam ?Vitals reviewed.  ?Constitutional:   ?   General: She is not in acute distress. ?HENT:  ?   Head: Normocephalic and atraumatic.  ?Eyes:  ?   Extraocular Movements: Extraocular movements intact.  ?   Conjunctiva/sclera: Conjunctivae normal.  ?Cardiovascular:  ?   Rate and Rhythm: Normal rate and regular rhythm.  ?   Pulses: Normal pulses.  ?Pulmonary:  ?   Effort: Pulmonary effort is normal. No respiratory distress.  ?   Breath sounds: Wheezing present. No rales.  ?Abdominal:  ?   General: Bowel sounds are normal. There is no distension.  ?   Palpations: Abdomen is soft.  ?   Tenderness: There is no abdominal tenderness.  ?Musculoskeletal:     ?   General: No swelling or tenderness.  ?   Cervical back: Normal range of motion and neck supple.  ?Skin: ?   General: Skin is warm and dry.  ?Neurological:  ?   General: No focal deficit present.  ?   Mental Status: She is alert and oriented to person, place, and time.  ?  ? ?Labs on Admission: I have personally reviewed following labs and imaging studies ? ?CBC: ?Recent Labs  ?Lab 04/20/21 ?1848 04/20/21 ?1854  ?WBC 6.6  --   ?NEUTROABS 3.0  --   ?HGB 12.5 13.3  ?HCT 37.8 39.0  ?MCV 94.7  --   ?PLT 286  --   ? ?Basic Metabolic Panel: ?Recent Labs  ?Lab 04/20/21 ?1848 04/20/21 ?1854  ?NA 138 139  ?K 3.8 3.9  ?CL 104  --   ?CO2 25  --   ?GLUCOSE 82  --   ?BUN 9  --   ?CREATININE 0.68  --   ?CALCIUM 9.1  --   ? ?GFR: ?CrCl cannot be calculated (Unknown ideal weight.). ?Liver Function Tests: ?Recent Labs  ?Lab 04/20/21 ?1848  ?AST 32  ?ALT 21  ?ALKPHOS 76  ?BILITOT 0.4  ?PROT 7.4  ?ALBUMIN 4.0  ? ?No results for  input(s): LIPASE, AMYLASE in the last 168 hours. ?No results for input(s): AMMONIA in the last 168 hours. ?Coagulation Profile: ?No results for input(s): INR, PROTIME in the last 168 hours. ?Cardiac Enzymes: ?No results for input(s): CKTOTAL, CKMB, CKMBINDEX, TROPONINI in the last 168 hours. ?BNP (last 3 results) ?  No results for input(s): PROBNP in the last 8760 hours. ?HbA1C: ?No results for input(s): HGBA1C in the last 72 hours. ?CBG: ?No results for input(s): GLUCAP in the last 168 hours. ?Lipid Profile: ?No results for input(s): CHOL, HDL, LDLCALC, TRIG, CHOLHDL, LDLDIRECT in the last 72 hours. ?Thyroid Function Tests: ?No results for input(s): TSH, T4TOTAL, FREET4, T3FREE, THYROIDAB in the last 72 hours. ?Anemia Panel: ?No results for input(s): VITAMINB12, FOLATE, FERRITIN, TIBC, IRON, RETICCTPCT in the last 72 hours. ?Urine analysis: ?   ?Component Value Date/Time  ? COLORURINE STRAW (A) 07/14/2020 0600  ? APPEARANCEUR CLEAR 07/14/2020 0600  ? LABSPEC 1.015 07/14/2020 0600  ? PHURINE 7.0 07/14/2020 0600  ? GLUCOSEU NEGATIVE 07/14/2020 0600  ? HGBUR NEGATIVE 07/14/2020 0600  ? BILIRUBINUR NEGATIVE 07/14/2020 0600  ? KETONESUR NEGATIVE 07/14/2020 0600  ? PROTEINUR NEGATIVE 07/14/2020 0600  ? NITRITE NEGATIVE 07/14/2020 0600  ? LEUKOCYTESUR NEGATIVE 07/14/2020 0600  ? ? ?Radiological Exams on Admission: I have personally reviewed images ?DG Chest Port 1 View ? ?Result Date: 04/20/2021 ?CLINICAL DATA:  Shortness of breath.  Respiratory distress. EXAM: PORTABLE CHEST 1 VIEW COMPARISON:  03/23/2021 FINDINGS: Heart size and pulmonary vascularity are normal. Lungs are clear. No pleural effusions. No pneumothorax. Mediastinal contours appear intact. Thoracolumbar scoliosis with fixation rod. IMPRESSION: No active disease. Electronically Signed   By: Lucienne Capers M.D.   On: 04/20/2021 19:05   ? ?EKG: Independently reviewed.  Sinus rhythm, QTc 491.  No significant change since prior tracing. ? ?Assessment and Plan: ?*  Acute on chronic respiratory failure with hypoxemia (HCC) ?Acute exacerbation of asthma/COPD ?SPO2 88% on 3 L home oxygen, wheezing, and very tachypneic on arrival to the ED.  Initially required BiPAP but work o

## 2021-04-20 NOTE — Assessment & Plan Note (Signed)
-  Using NicoDerm patch at home, continue ?

## 2021-04-21 ENCOUNTER — Encounter (HOSPITAL_COMMUNITY): Payer: Self-pay | Admitting: Internal Medicine

## 2021-04-21 LAB — RESP PANEL BY RT-PCR (FLU A&B, COVID) ARPGX2
Influenza A by PCR: NEGATIVE
Influenza B by PCR: NEGATIVE
SARS Coronavirus 2 by RT PCR: NEGATIVE

## 2021-04-21 LAB — MAGNESIUM: Magnesium: 2 mg/dL (ref 1.7–2.4)

## 2021-04-21 NOTE — ED Notes (Signed)
Pt desat to 86% on RA while sleeping, RN applied 2LPM per [place], updated Dr Verlon Au ?

## 2021-04-21 NOTE — Progress Notes (Signed)
Patient states she can stay with her sister Laurier Nancy when she is discharged. Patient states her sister lives at [address] in [place]. Patient stated her sisters number is [phone]. CSW asked for the last four digits but patient was unable to tell CSW. Patient could barely stay awake when asked questions.  ?

## 2021-04-21 NOTE — Progress Notes (Signed)
?PROGRESS NOTE ? ? ?Rachel Nolan  [id number] DOB: 11/27/1970 DOA: 04/20/2021 ?PCP: Pcp, No  ?Brief Narrative:  ? ?51 year old black female known history polysubstance abuse ?Recent admission 3/11-3/22 passive suicide intent-psychotropics including fluoxetine hydroxyzine prazosin quetiapine trazodone also substance abuse inclusive of marijuana cocaine heroin ? ?SOB x 48 hours since 4/2 Rx Atrovent Solu-Medrol albuterol 88% on 3 L-NAD decision for BiPAP (apparently uses CPAP at night?)-Weaned to 4 L [place] after WOB improved-pH 7.33 PCO2 51 ? ?COVID and flu negative ?EKG my over read = sinus tach PR interval 0.20 QRS axis 45 degrees diffuse ST-T wave changes?  Incomplete left bundle no damage pattern noted ?She tells me that she has a sister in town but appears from chart review that she is homeless ?-Has had apparently multiple stressful events including death of multiple relatives ? ?Hospital-Problem based course ? ? ?Acute hypoxic respiratory failure secondary to probable COPD exacerbation ??  Cocaine lung prior significant substance abuse with EtOH in addition ?Continue Solu-Medrol At this time ?Satting fairly well on just room air ?No antibiotics at this time COVID/flu are negative ?I had a brief discussion with her about stopping all of her substances including her crack cocaine-I am hopeful that she will quit ?Recent admission for passive SI ?PTSD ?No suicidality based on her discussion with me today ?Significant polypharmacy exists ?Continuing Prozac 40, hydroxyzine 50 3 times daily as needed, Seroquel 100, gabapentin 200 3 times daily, prazosin 1 mg at bedtime ?She will need close monitoring in the outpatient setting with her psychiatrist I will CC Dr. Weber Cooks ??  Homeless-TOC to check in on patient ?HTN ?Continue prazosin as above ? ?DVT prophylaxis: Lovenox ?Code Status: Presumed full ?Family Communication: None present at the bedside ?Disposition:  ?Status is: Inpatient ?Remains inpatient appropriate  because:  ? ?Ongoing symptoms ?  ?Consultants:  ?None ? ?Procedures: No ? ?Antimicrobials: No ? ? ?Subjective: ?Quite sleepy-no distress-when I take her off of the oxygen she does not desat although she is quite wheezy throughout ?We had a chat about her stopping all of her substances-it is hoped that she will register something and decide to quit ?Tells me she has a sister in town but I do not have the number in the chart the only number is a friend" Carmell [place] ? ?Objective: ?Vitals:  ? 04/21/21 0215 04/21/21 0230 04/21/21 0311 04/21/21 0430  ?BP: 107/76 104/75  104/76  ?Pulse: (!) 110 (!) 109  91  ?Resp: 19 (!) 21  (!) 21  ?Temp:      ?TempSrc:      ?SpO2: 94% 94% 94% 96%  ? ?No intake or output data in the 24 hours ending 04/21/21 0731 ?There were no vitals filed for this visit. ? ?Examination: ? ?Chronically ill appearing 51 year old black female ?Poor dentition ?Somewhat sleepy ?Neck soft supple ?Diffuse wheeze posterolaterally no rales no rhonchi ?S1-S2 slightly tachycardic seems to be sinus ?Abdomen is soft ?No lower extremity edema ?Neurologically intact follows commands but is a little sleepy ? ?Data Reviewed: personally reviewed  ? ?CBC ?   ?Component Value Date/Time  ? WBC 6.6 04/20/2021 1848  ? RBC 3.99 04/20/2021 1848  ? HGB 13.3 04/20/2021 1854  ? HCT 39.0 04/20/2021 1854  ? PLT 286 04/20/2021 1848  ? MCV 94.7 04/20/2021 1848  ? MCH 31.3 04/20/2021 1848  ? MCHC 33.1 04/20/2021 1848  ? RDW 15.3 04/20/2021 1848  ? LYMPHSABS 2.4 04/20/2021 1848  ? MONOABS 0.8 04/20/2021 1848  ? EOSABS 0.3 04/20/2021 1848  ?  BASOSABS 0.0 04/20/2021 1848  ? ? ?  Latest Ref Rng & Units 04/20/2021  ?  6:54 PM 04/20/2021  ?  6:48 PM 03/23/2021  ?  6:05 PM  ?CMP  ?Glucose 70 - 99 mg/dL  82     ?BUN 6 - 20 mg/dL  9     ?Creatinine 0.44 - 1.00 mg/dL  0.68     ?Sodium 135 - 145 mmol/L 139   138   141    ?Potassium 3.5 - 5.1 mmol/L 3.9   3.8   3.6    ?Chloride 98 - 111 mmol/L  104     ?CO2 22 - 32 mmol/L  25     ?Calcium 8.9 - 10.3  mg/dL  9.1     ?Total Protein 6.5 - 8.1 g/dL  7.4     ?Total Bilirubin 0.3 - 1.2 mg/dL  0.4     ?Alkaline Phos 38 - 126 U/L  76     ?AST 15 - 41 U/L  32     ?ALT 0 - 44 U/L  21     ? ? ? ?Radiology Studies: ?DG Chest Port 1 View ? ?Result Date: 04/20/2021 ?CLINICAL DATA:  Shortness of breath.  Respiratory distress. EXAM: PORTABLE CHEST 1 VIEW COMPARISON:  03/23/2021 FINDINGS: Heart size and pulmonary vascularity are normal. Lungs are clear. No pleural effusions. No pneumothorax. Mediastinal contours appear intact. Thoracolumbar scoliosis with fixation rod. IMPRESSION: No active disease. Electronically Signed   By: Lucienne Capers M.D.   On: 04/20/2021 19:05   ? ? ?Scheduled Meds: ? budesonide (PULMICORT) nebulizer solution  2 mg Nebulization Q12H  ? enoxaparin (LOVENOX) injection  40 mg Subcutaneous Q24H  ? FLUoxetine  40 mg Oral q morning  ? gabapentin  200 mg Oral TID  ? ipratropium-albuterol  3 mL Nebulization Q6H  ? methylPREDNISolone (SOLU-MEDROL) injection  80 mg Intravenous Q12H  ? montelukast  10 mg Oral QHS  ? nicotine  21 mg Transdermal Daily  ? prazosin  1 mg Oral QHS  ? QUEtiapine  100 mg Oral QHS  ? ?Continuous Infusions: ? ? LOS: 1 day  ? ?Time spent: 9 ? ?Nita Sells, MD ?[facility] ?To contact the attending provider between 7A-7P or the covering provider during after hours 7P-7A, please log into the web site [email or web address] and access using universal [place] password for that web site. If you do not have the password, please call the hospital operator. ? ?04/21/2021, 7:31 AM  ? ? ?

## 2021-04-21 NOTE — Progress Notes (Signed)
Patient arrived to 5W28 in NAD, VS stable and patient free from pain. Patient oriented to room and call bell in reach.  ?

## 2021-04-22 DIAGNOSIS — J441 Chronic obstructive pulmonary disease with (acute) exacerbation: Principal | ICD-10-CM

## 2021-04-22 LAB — COMPREHENSIVE METABOLIC PANEL
ALT: 15 U/L (ref 0–44)
AST: 16 U/L (ref 15–41)
Albumin: 3.3 g/dL — ABNORMAL LOW (ref 3.5–5.0)
Alkaline Phosphatase: 66 U/L (ref 38–126)
Anion gap: 7 (ref 5–15)
BUN: 28 mg/dL — ABNORMAL HIGH (ref 6–20)
CO2: 28 mmol/L (ref 22–32)
Calcium: 9.5 mg/dL (ref 8.9–10.3)
Chloride: 103 mmol/L (ref 98–111)
Creatinine, Ser: 0.88 mg/dL (ref 0.44–1.00)
GFR, Estimated: >60 mL/min (ref >60)
Glucose, Bld: 126 mg/dL — ABNORMAL HIGH (ref 70–99)
Potassium: 4.2 mmol/L (ref 3.5–5.1)
Sodium: 138 mmol/L (ref 135–145)
Total Bilirubin: 0.3 mg/dL (ref 0.3–1.2)
Total Protein: 6.5 g/dL (ref 6.5–8.1)

## 2021-04-22 LAB — CBC WITH DIFFERENTIAL/PLATELET
Abs Immature Granulocytes: 0.07 10*3/uL (ref 0.00–0.07)
Basophils Absolute: 0 10*3/uL (ref 0.0–0.1)
Basophils Relative: 0 %
Eosinophils Absolute: 0 10*3/uL (ref 0.0–0.5)
Eosinophils Relative: 0 %
HCT: 36.1 % (ref 36.0–46.0)
Hemoglobin: 12.2 g/dL (ref 12.0–15.0)
Immature Granulocytes: 1 %
Lymphocytes Relative: 11 %
Lymphs Abs: 1.4 10*3/uL (ref 0.7–4.0)
MCH: 31.4 pg (ref 26.0–34.0)
MCHC: 33.8 g/dL (ref 30.0–36.0)
MCV: 92.8 fL (ref 80.0–100.0)
Monocytes Absolute: 1.1 10*3/uL — ABNORMAL HIGH (ref 0.1–1.0)
Monocytes Relative: 9 %
Neutro Abs: 10.8 10*3/uL — ABNORMAL HIGH (ref 1.7–7.7)
Neutrophils Relative %: 79 %
Platelets: 260 10*3/uL (ref 150–400)
RBC: 3.89 MIL/uL (ref 3.87–5.11)
RDW: 15.7 % — ABNORMAL HIGH (ref 11.5–15.5)
WBC: 13.4 10*3/uL — ABNORMAL HIGH (ref 4.0–10.5)
nRBC: 0 % (ref 0.0–0.2)

## 2021-04-22 MED ORDER — PREDNISONE 10 MG (21) PO TBPK: ORAL_TABLET | ORAL | 0 refills | Status: DC

## 2021-04-22 NOTE — Discharge Summary (Signed)
Physician Discharge Summary  ?Rachel Nolan [id number] DOB: 1970/06/04 DOA: 04/20/2021 ? ? ?                  AMA discharge summary  ? ? ? ? ? ?PCP: Pcp, No ? ?Admit date: 04/20/2021 ?Discharge date: 04/22/2021 ? ?Admitted From:Home ?Disposition:  Patient left AMA on 4/5  ? ? ?Brief/Interim Summary: ? ?Please see progress note dictated earlier today, patient was admitted for acute on chronic hypoxic respiratory failure, at baseline on 3 L nasal cannula at home, work-up significant for COPD/asthma exacerbation diminished air entry and wheezing for which she was started on scheduled DuoNebs, and IV steroids, patient remains on IV steroids, still with wheezing, patient adamant about leaving now, she was counseled, discussed with her, she is aware of risks of worsening respiratory failure, return to the hospital and even death, she is still adamant about going home today, patient reports she has home oxygen already set up at her house. ? ? ?Acute on chronic respiratory failure with hypoxemia (HCC) ?Acute exacerbation of asthma/COPD ?SPO2 88% on 3 L home oxygen, wheezing, and very tachypneic on arrival to the ED.  Initially required BiPAP  ?-Respiratory status is improving, no further BiPAP requirement . ?-She remains at her baseline oxygen requirement at home . ?-She was treated with IV steroids, nebulizer treatment, Singulair, and as needed albuterol . ?during hospital stay.  ?  ?History of substance abuse ([place]) ?-Still endorsing using cocaine, she was counseled ?  ?Tobacco abuse ?-Using NicoDerm patch at home, continue ?  ?Essential hypertension ?Stable. ?-Continue home prazosin ?  ?Anxiety and depression ?-She denies any suicidal thoughts or ideations. ?  ? ?Discharge Diagnoses:  ?Principal Problem: ?  Acute on chronic respiratory failure with hypoxemia (HCC) ?Active Problems: ?  Anxiety and depression ?  Essential hypertension ?  Tobacco abuse ?  History of substance abuse  ([place]) ? ?Consultations: ?none ? ? ?Procedures/Studies: ?DG Chest Port 1 View ? ?Result Date: 04/20/2021 ?CLINICAL DATA:  Shortness of breath.  Respiratory distress. EXAM: PORTABLE CHEST 1 VIEW COMPARISON:  03/23/2021 FINDINGS: Heart size and pulmonary vascularity are normal. Lungs are clear. No pleural effusions. No pneumothorax. Mediastinal contours appear intact. Thoracolumbar scoliosis with fixation rod. IMPRESSION: No active disease. Electronically Signed   By: Lucienne Capers M.D.   On: 04/20/2021 19:05   ? ? ?Discharge Exam: ?Vitals:  ? 04/22/21 1458 04/22/21 1549  ?BP:  129/83  ?Pulse:    ?Resp:    ?Temp:  97.8 ?F (36.6 ?C)  ?SpO2: 100%   ? ?Vitals:  ? 04/22/21 0919 04/22/21 1200 04/22/21 1458 04/22/21 1549  ?BP:    129/83  ?Pulse:      ?Resp:      ?Temp:  98.3 ?F (36.8 ?C)  97.8 ?F (36.6 ?C)  ?TempSrc:  Oral  Axillary  ?SpO2: 98%  100%   ?Weight:      ?Height:      ? ? ? ?The results of significant diagnostics from this hospitalization (including imaging, microbiology, ancillary and laboratory) are listed below for reference.   ? ? ?Microbiology: ?Recent Results (from the past 240 hour(s))  ?Resp Panel by RT-PCR (Flu A&B, Covid) Nasopharyngeal Swab     Status: None  ? Collection Time: 04/21/21  1:18 AM  ? Specimen: Nasopharyngeal Swab; Nasopharyngeal(NP) swabs in vial transport medium  ?Result Value Ref Range Status  ? SARS Coronavirus 2 by RT PCR NEGATIVE NEGATIVE Final  ?  Comment: (NOTE) ?SARS-CoV-2 target nucleic acids are  NOT DETECTED. ? ?The SARS-CoV-2 RNA is generally detectable in upper respiratory ?specimens during the acute phase of infection. The lowest ?concentration of SARS-CoV-2 viral copies this assay can detect is ?138 copies/mL. A negative result does not preclude SARS-Cov-2 ?infection and should not be used as the sole basis for treatment or ?other patient management decisions. A negative result may occur with  ?improper specimen collection/handling, submission of specimen other ?than  nasopharyngeal swab, presence of viral mutation(s) within the ?areas targeted by this assay, and inadequate number of viral ?copies(<138 copies/mL). A negative result must be combined with ?clinical observations, patient history, and epidemiological ?information. The expected result is Negative. ? ?Fact Sheet for Patients:  ?EntrepreneurPulse.com.au ? ?Fact Sheet for Healthcare Providers:  ?IncredibleEmployment.be ? ?This test is no t yet approved or cleared by the [place] FDA and  ?has been authorized for detection and/or diagnosis of SARS-CoV-2 by ?FDA under an Emergency Use Authorization (EUA). This EUA will remain  ?in effect (meaning this test can be used) for the duration of the ?COVID-19 declaration under Section 564(b)(1) of the Act, 21 ?U.S.C.section 360bbb-3(b)(1), unless the authorization is terminated  ?or revoked sooner.  ? ? ?  ? Influenza A by PCR NEGATIVE NEGATIVE Final  ? Influenza B by PCR NEGATIVE NEGATIVE Final  ?  Comment: (NOTE) ?The Xpert Xpress SARS-CoV-2/FLU/RSV plus assay is intended as an aid ?in the diagnosis of influenza from Nasopharyngeal swab specimens and ?should not be used as a sole basis for treatment. Nasal washings and ?aspirates are unacceptable for Xpert Xpress SARS-CoV-2/FLU/RSV ?testing. ? ?Fact Sheet for Patients: ?EntrepreneurPulse.com.au ? ?Fact Sheet for Healthcare Providers: ?IncredibleEmployment.be ? ?This test is not yet approved or cleared by the [place] FDA and ?has been authorized for detection and/or diagnosis of SARS-CoV-2 by ?FDA under an Emergency Use Authorization (EUA). This EUA will remain ?in effect (meaning this test can be used) for the duration of the ?COVID-19 declaration under Section 564(b)(1) of the Act, 21 U.S.C. ?section 360bbb-3(b)(1), unless the authorization is terminated or ?revoked. ? ?Performed at [facility], [place] [address], [place], [place] ?[address] ?   ?  ? ?Labs: ?BNP (last 3 results) ?Recent Labs  ?  02/23/21 ?1155 03/23/21 ?1355 04/20/21 ?1848  ?BNP 14.0 7.4 13.8  ? ?Basic Metabolic Panel: ?Recent Labs  ?Lab 04/20/21 ?1848 04/20/21 ?1854 04/22/21 ?0603  ?NA 138 139 138  ?K 3.8 3.9 4.2  ?CL 104  --  103  ?CO2 25  --  28  ?GLUCOSE 82  --  126*  ?BUN 9  --  28*  ?CREATININE 0.68  --  0.88  ?CALCIUM 9.1  --  9.5  ?MG 2.0  --   --   ? ?Liver Function Tests: ?Recent Labs  ?Lab 04/20/21 ?1848 04/22/21 ?0603  ?AST 32 16  ?ALT 21 15  ?ALKPHOS 76 66  ?BILITOT 0.4 0.3  ?PROT 7.4 6.5  ?ALBUMIN 4.0 3.3*  ? ?No results for input(s): LIPASE, AMYLASE in the last 168 hours. ?No results for input(s): AMMONIA in the last 168 hours. ?CBC: ?Recent Labs  ?Lab 04/20/21 ?1848 04/20/21 ?1854 04/22/21 ?0603  ?WBC 6.6  --  13.4*  ?NEUTROABS 3.0  --  10.8*  ?HGB 12.5 13.3 12.2  ?HCT 37.8 39.0 36.1  ?MCV 94.7  --  92.8  ?PLT 286  --  260  ? ?Cardiac Enzymes: ?No results for input(s): CKTOTAL, CKMB, CKMBINDEX, TROPONINI in the last 168 hours. ?BNP: ?Invalid input(s): POCBNP ?CBG: ?No results for  input(s): GLUCAP in the last 168 hours. ?D-Dimer ?No results for input(s): DDIMER in the last 72 hours. ?Hgb A1c ?No results for input(s): HGBA1C in the last 72 hours. ?Lipid Profile ?No results for input(s): CHOL, HDL, LDLCALC, TRIG, CHOLHDL, LDLDIRECT in the last 72 hours. ?Thyroid function studies ?No results for input(s): TSH, T4TOTAL, T3FREE, THYROIDAB in the last 72 hours. ? ?Invalid input(s): FREET3 ?Anemia work up ?No results for input(s): VITAMINB12, FOLATE, FERRITIN, TIBC, IRON, RETICCTPCT in the last 72 hours. ?Urinalysis ?   ?Component Value Date/Time  ? COLORURINE STRAW (A) 07/14/2020 0600  ? APPEARANCEUR CLEAR 07/14/2020 0600  ? LABSPEC 1.015 07/14/2020 0600  ? PHURINE 7.0 07/14/2020 0600  ? GLUCOSEU NEGATIVE 07/14/2020 0600  ? HGBUR NEGATIVE 07/14/2020 0600  ? BILIRUBINUR NEGATIVE 07/14/2020 0600  ? KETONESUR NEGATIVE 07/14/2020 0600  ? PROTEINUR NEGATIVE 07/14/2020 0600  ? NITRITE  NEGATIVE 07/14/2020 0600  ? LEUKOCYTESUR NEGATIVE 07/14/2020 0600  ? ?Sepsis Labs ?Invalid input(s): PROCALCITONIN,  WBC,  LACTICIDVEN ?Microbiology ?Recent Results (from the past 240 hour(s))  ?Resp Panel by RT-PCR (Flu A&B, Covid) Nasopharyngeal Sw

## 2021-04-22 NOTE — Progress Notes (Signed)
When rounding, patient stated she needed to leave right now due to a family emergency. Explained to patient she did not have discharge orders and she would be leaving against medical advice. Dr. Waldron Labs made aware. Patient alert and oriented and voiced understanding of risks involved with leaving AMA. AMA paper signed and patient picked up by family member.  ?

## 2021-04-22 NOTE — Progress Notes (Addendum)
?PROGRESS NOTE ? ? ? ?Rachel LandrySandra Latulippe  [id number] DOB: November 06, 1970 DOA: 04/20/2021 ?PCP: Pcp, No  ? ? ?Chief Complaint  ?Patient presents with  ? Respiratory Distress  ? ? ?Brief Narrative: ? ?Rachel Nolan is a 51 y.o. female with medical history significant of asthma, COPD, chronic hypoxemic respiratory failure on home oxygen, anxiety, depression, PTSD, substance abuse, hypertension presented to the ED via EMS for evaluation of respiratory distress.  Albuterol, Atrovent, and Solu-Medrol 125 mg in route.  SPO2 88% on 3 L home oxygen, wheezing, and very tachypneic on arrival to the ED and was placed on BiPAP.  She was given DuoNeb, continuous albuterol neb treatment, and IV magnesium 2 g.  Work of breathing subsequently improved and transitioned to 4 L [place].  No fever or leukocytosis.  BNP normal.  VBG with pH 7.33, PCO2 51.  COVID and influenza PCR pending.  Chest x-ray showing no active disease.  EKG without acute ischemic changes. ? ?Assessment & Plan: ?  ?Principal Problem: ?  Acute on chronic respiratory failure with hypoxemia (HCC) ?Active Problems: ?  Anxiety and depression ?  Essential hypertension ?  Tobacco abuse ?  History of substance abuse (HCC) ? ?Acute on chronic respiratory failure with hypoxemia (HCC) ?Acute exacerbation of asthma/COPD ?SPO2 88% on 3 L home oxygen, wheezing, and very tachypneic on arrival to the ED.  Initially required BiPAP  ?-Respiratory status is improving, no further BiPAP requirement . ?-She remains at her baseline oxygen requirement at home . ?-She remains with diminished air entry and wheezing, so continue at current dose Solu-Medrol 80 mg every 12 hours with no tapering . ?-Continue with scheduled DuoNebs and as needed albuterol ?-Pulmicort neb twice daily ?-Continue Singulair ? ?History of substance abuse (HCC) ?-Still endorsing using cocaine, she was counseled ?  ?Tobacco abuse ?-Using NicoDerm patch at home, continue ?  ?Essential hypertension ?Stable. ?-Continue home  prazosin ?  ?Anxiety and depression ?-She denies any suicidal thoughts or ideations. ?  ? ? ? ?DVT prophylaxis:  ?Code Status: Full ?Family Communication: None at bedside ?Disposition:  ? ?Status is: Inpatient ?Remains inpatient appropriate because: IV steroids, remains with diminished air entry and wheezing ?  ?Consultants:  ?None ? ? ?Subjective: ? ?History reports dyspnea, denies any chest pain or fever ? ?Objective: ?Vitals:  ? 04/22/21 0823 04/22/21 0907 04/22/21 0919 04/22/21 1200  ?BP: 116/77     ?Pulse: 99     ?Resp: 18     ?Temp: 98.1 ?F (36.7 ?C)   98.3 ?F (36.8 ?C)  ?TempSrc: Oral   Oral  ?SpO2: 91% 99% 98%   ?Weight:      ?Height:      ? ? ?Intake/Output Summary (Last 24 hours) at 04/22/2021 1443 ?Last data filed at 04/21/2021 1700 ?Gross per 24 hour  ?Intake 240 ml  ?Output --  ?Net 240 ml  ? ?Filed Weights  ? 04/21/21 1633  ?Weight: 81.6 kg  ? ? ?Examination: ? ?Awake Alert, Oriented X 3, No new F.N deficits, Normal affect ?Symmetrical Chest wall movement, diminished air entry and wheezing ?RRR,No Gallops,Rubs or new Murmurs, No Parasternal Heave ?+ve B.Sounds, Abd Soft, No tenderness, No rebound - guarding or rigidity. ?No Cyanosis, Clubbing or edema, No new Rash or bruise   ? ? ? ?Data Reviewed: I have personally reviewed following labs and imaging studies ? ?CBC: ?Recent Labs  ?Lab 04/20/21 ?1848 04/20/21 ?1854 04/22/21 ?0603  ?WBC 6.6  --  13.4*  ?NEUTROABS 3.0  --  10.8*  ?  HGB 12.5 13.3 12.2  ?HCT 37.8 39.0 36.1  ?MCV 94.7  --  92.8  ?PLT 286  --  260  ? ? ?Basic Metabolic Panel: ?Recent Labs  ?Lab 04/20/21 ?1848 04/20/21 ?1854 04/22/21 ?0603  ?NA 138 139 138  ?K 3.8 3.9 4.2  ?CL 104  --  103  ?CO2 25  --  28  ?GLUCOSE 82  --  126*  ?BUN 9  --  28*  ?CREATININE 0.68  --  0.88  ?CALCIUM 9.1  --  9.5  ?MG 2.0  --   --   ? ? ?GFR: ?Estimated Creatinine Clearance: 80.5 mL/min (by C-G formula based on SCr of 0.88 mg/dL). ? ?Liver Function Tests: ?Recent Labs  ?Lab 04/20/21 ?1848 04/22/21 ?0603  ?AST 32 16   ?ALT 21 15  ?ALKPHOS 76 66  ?BILITOT 0.4 0.3  ?PROT 7.4 6.5  ?ALBUMIN 4.0 3.3*  ? ? ?CBG: ?No results for input(s): GLUCAP in the last 168 hours. ? ? ?Recent Results (from the past 240 hour(s))  ?Resp Panel by RT-PCR (Flu A&B, Covid) Nasopharyngeal Swab     Status: None  ? Collection Time: 04/21/21  1:18 AM  ? Specimen: Nasopharyngeal Swab; Nasopharyngeal(NP) swabs in vial transport medium  ?Result Value Ref Range Status  ? SARS Coronavirus 2 by RT PCR NEGATIVE NEGATIVE Final  ?  Comment: (NOTE) ?SARS-CoV-2 target nucleic acids are NOT DETECTED. ? ?The SARS-CoV-2 RNA is generally detectable in upper respiratory ?specimens during the acute phase of infection. The lowest ?concentration of SARS-CoV-2 viral copies this assay can detect is ?138 copies/mL. A negative result does not preclude SARS-Cov-2 ?infection and should not be used as the sole basis for treatment or ?other patient management decisions. A negative result may occur with  ?improper specimen collection/handling, submission of specimen other ?than nasopharyngeal swab, presence of viral mutation(s) within the ?areas targeted by this assay, and inadequate number of viral ?copies(<138 copies/mL). A negative result must be combined with ?clinical observations, patient history, and epidemiological ?information. The expected result is Negative. ? ?Fact Sheet for Patients:  ?BloggerCourse.com ? ?Fact Sheet for Healthcare Providers:  ?SeriousBroker.it ? ?This test is no t yet approved or cleared by the [place] FDA and  ?has been authorized for detection and/or diagnosis of SARS-CoV-2 by ?FDA under an Emergency Use Authorization (EUA). This EUA will remain  ?in effect (meaning this test can be used) for the duration of the ?COVID-19 declaration under Section 564(b)(1) of the Act, 21 ?U.S.C.section 360bbb-3(b)(1), unless the authorization is terminated  ?or revoked sooner.  ? ? ?  ? Influenza A by PCR NEGATIVE  NEGATIVE Final  ? Influenza B by PCR NEGATIVE NEGATIVE Final  ?  Comment: (NOTE) ?The Xpert Xpress SARS-CoV-2/FLU/RSV plus assay is intended as an aid ?in the diagnosis of influenza from Nasopharyngeal swab specimens and ?should not be used as a sole basis for treatment. Nasal washings and ?aspirates are unacceptable for Xpert Xpress SARS-CoV-2/FLU/RSV ?testing. ? ?Fact Sheet for Patients: ?BloggerCourse.com ? ?Fact Sheet for Healthcare Providers: ?SeriousBroker.it ? ?This test is not yet approved or cleared by the [place] FDA and ?has been authorized for detection and/or diagnosis of SARS-CoV-2 by ?FDA under an Emergency Use Authorization (EUA). This EUA will remain ?in effect (meaning this test can be used) for the duration of the ?COVID-19 declaration under Section 564(b)(1) of the Act, 21 U.S.C. ?section 360bbb-3(b)(1), unless the authorization is terminated or ?revoked. ? ?Performed at [facility], [address]  [place], [place] ?[address] ?  ?  ? ? ? ? ? ?Radiology Studies: ?DG Chest Port 1 View ? ?Result Date: 04/20/2021 ?CLINICAL DATA:  Shortness of breath.  Respiratory distress. EXAM: PORTABLE CHEST 1 VIEW COMPARISON:  03/23/2021 FINDINGS: Heart size and pulmonary vascularity are normal. Lungs are clear. No pleural effusions. No pneumothorax. Mediastinal contours appear intact. Thoracolumbar scoliosis with fixation rod. IMPRESSION: No active disease. Electronically Signed   By: Burman Nieves M.D.   On: 04/20/2021 19:05   ? ? ? ? ? ?Scheduled Meds: ? budesonide (PULMICORT) nebulizer solution  2 mg Nebulization Q12H  ? enoxaparin (LOVENOX) injection  40 mg Subcutaneous Q24H  ? FLUoxetine  40 mg Oral q morning  ? gabapentin  200 mg Oral TID  ? ipratropium-albuterol  3 mL Nebulization Q6H  ? methylPREDNISolone (SOLU-MEDROL) injection  80 mg Intravenous Q12H  ? montelukast  10 mg Oral QHS  ? nicotine  21 mg Transdermal Daily  ? prazosin  1 mg Oral  QHS  ? QUEtiapine  100 mg Oral QHS  ? ?Continuous Infusions: ? ? LOS: 2 days  ? ? ? ? ?Huey Bienenstock, MD ?[facility] ? ? ?To contact the attending provider between 7A-7P or the covering provi

## 2021-04-30 ENCOUNTER — Other Ambulatory Visit: Payer: Self-pay

## 2021-05-04 ENCOUNTER — Telehealth: Payer: Self-pay | Admitting: Nurse Practitioner

## 2021-05-04 NOTE — Telephone Encounter (Signed)
Patient has 60 day supply till May based on her medication profile  ?

## 2021-05-04 NOTE — Telephone Encounter (Signed)
Patient called requesting refills on medications prescribed at discharge. No -showed at follow up appointment. Advised to call or go to the [facility] office and request. No walk-in appointments at [facility]. ?

## 2021-05-06 ENCOUNTER — Other Ambulatory Visit: Payer: Self-pay

## 2021-05-07 ENCOUNTER — Other Ambulatory Visit: Payer: Self-pay

## 2021-05-08 ENCOUNTER — Other Ambulatory Visit: Payer: Self-pay

## 2021-05-11 ENCOUNTER — Inpatient Hospital Stay (HOSPITAL_COMMUNITY)
Admission: EM | Admit: 2021-05-11 | Discharge: 2021-05-13 | DRG: 202 | Disposition: A | Discharge status: Home Health | Payer: Self-pay | Attending: Internal Medicine | Admitting: Internal Medicine

## 2021-05-11 ENCOUNTER — Other Ambulatory Visit: Payer: Self-pay

## 2021-05-11 ENCOUNTER — Emergency Department (HOSPITAL_COMMUNITY): Payer: Self-pay

## 2021-05-11 ENCOUNTER — Encounter (HOSPITAL_COMMUNITY): Payer: Self-pay | Admitting: *Deleted

## 2021-05-11 DIAGNOSIS — Z7952 Long term (current) use of systemic steroids: Secondary | ICD-10-CM

## 2021-05-11 DIAGNOSIS — J45901 Unspecified asthma with (acute) exacerbation: Principal | ICD-10-CM | POA: Diagnosis present

## 2021-05-11 DIAGNOSIS — Z8249 Family history of ischemic heart disease and other diseases of the circulatory system: Secondary | ICD-10-CM

## 2021-05-11 DIAGNOSIS — I10 Essential (primary) hypertension: Secondary | ICD-10-CM | POA: Diagnosis present

## 2021-05-11 DIAGNOSIS — F32A Depression, unspecified: Secondary | ICD-10-CM | POA: Diagnosis present

## 2021-05-11 DIAGNOSIS — G4733 Obstructive sleep apnea (adult) (pediatric): Secondary | ICD-10-CM | POA: Diagnosis present

## 2021-05-11 DIAGNOSIS — J9611 Chronic respiratory failure with hypoxia: Secondary | ICD-10-CM | POA: Diagnosis present

## 2021-05-11 DIAGNOSIS — F1410 Cocaine abuse, uncomplicated: Secondary | ICD-10-CM | POA: Diagnosis present

## 2021-05-11 DIAGNOSIS — J4521 Mild intermittent asthma with (acute) exacerbation: Secondary | ICD-10-CM

## 2021-05-11 DIAGNOSIS — J441 Chronic obstructive pulmonary disease with (acute) exacerbation: Secondary | ICD-10-CM

## 2021-05-11 DIAGNOSIS — Z20822 Contact with and (suspected) exposure to covid-19: Secondary | ICD-10-CM | POA: Diagnosis present

## 2021-05-11 DIAGNOSIS — J449 Chronic obstructive pulmonary disease, unspecified: Secondary | ICD-10-CM | POA: Diagnosis present

## 2021-05-11 DIAGNOSIS — Z79899 Other long term (current) drug therapy: Secondary | ICD-10-CM

## 2021-05-11 DIAGNOSIS — F1911 Other psychoactive substance abuse, in remission: Secondary | ICD-10-CM | POA: Diagnosis present

## 2021-05-11 DIAGNOSIS — Z720 Tobacco use: Secondary | ICD-10-CM | POA: Diagnosis present

## 2021-05-11 DIAGNOSIS — R0603 Acute respiratory distress: Secondary | ICD-10-CM

## 2021-05-11 DIAGNOSIS — F4310 Post-traumatic stress disorder, unspecified: Secondary | ICD-10-CM | POA: Diagnosis present

## 2021-05-11 DIAGNOSIS — F419 Anxiety disorder, unspecified: Secondary | ICD-10-CM | POA: Diagnosis present

## 2021-05-11 DIAGNOSIS — F17210 Nicotine dependence, cigarettes, uncomplicated: Secondary | ICD-10-CM | POA: Diagnosis present

## 2021-05-11 LAB — CBC WITH DIFFERENTIAL/PLATELET
Abs Immature Granulocytes: 0.02 10*3/uL (ref 0.00–0.07)
Basophils Absolute: 0 10*3/uL (ref 0.0–0.1)
Basophils Relative: 0 %
Eosinophils Absolute: 0.4 10*3/uL (ref 0.0–0.5)
Eosinophils Relative: 7 %
HCT: 39.5 % (ref 36.0–46.0)
Hemoglobin: 12.9 g/dL (ref 12.0–15.0)
Immature Granulocytes: 0 %
Lymphocytes Relative: 31 %
Lymphs Abs: 1.7 10*3/uL (ref 0.7–4.0)
MCH: 30.8 pg (ref 26.0–34.0)
MCHC: 32.7 g/dL (ref 30.0–36.0)
MCV: 94.3 fL (ref 80.0–100.0)
Monocytes Absolute: 0.5 10*3/uL (ref 0.1–1.0)
Monocytes Relative: 9 %
Neutro Abs: 2.9 10*3/uL (ref 1.7–7.7)
Neutrophils Relative %: 53 %
Platelets: 204 10*3/uL (ref 150–400)
RBC: 4.19 MIL/uL (ref 3.87–5.11)
RDW: 15.3 % (ref 11.5–15.5)
WBC: 5.4 10*3/uL (ref 4.0–10.5)
nRBC: 0 % (ref 0.0–0.2)

## 2021-05-11 LAB — COMPREHENSIVE METABOLIC PANEL
ALT: 10 U/L (ref 0–44)
AST: 14 U/L — ABNORMAL LOW (ref 15–41)
Albumin: 3.8 g/dL (ref 3.5–5.0)
Alkaline Phosphatase: 67 U/L (ref 38–126)
Anion gap: 9 (ref 5–15)
BUN: 6 mg/dL (ref 6–20)
CO2: 26 mmol/L (ref 22–32)
Calcium: 9.3 mg/dL (ref 8.9–10.3)
Chloride: 106 mmol/L (ref 98–111)
Creatinine, Ser: 0.64 mg/dL (ref 0.44–1.00)
GFR, Estimated: >60 mL/min (ref >60)
Glucose, Bld: 122 mg/dL — ABNORMAL HIGH (ref 70–99)
Potassium: 3.6 mmol/L (ref 3.5–5.1)
Sodium: 141 mmol/L (ref 135–145)
Total Bilirubin: 0.4 mg/dL (ref 0.3–1.2)
Total Protein: 7 g/dL (ref 6.5–8.1)

## 2021-05-11 LAB — I-STAT VENOUS BLOOD GAS, ED
Acid-Base Excess: 4 mmol/L — ABNORMAL HIGH (ref 0.0–2.0)
Bicarbonate: 30.6 mmol/L — ABNORMAL HIGH (ref 20.0–28.0)
Calcium, Ion: 1.19 mmol/L (ref 1.15–1.40)
HCT: 40 % (ref 36.0–46.0)
Hemoglobin: 13.6 g/dL (ref 12.0–15.0)
O2 Saturation: 99 %
Potassium: 3.6 mmol/L (ref 3.5–5.1)
Sodium: 141 mmol/L (ref 135–145)
TCO2: 32 mmol/L (ref 22–32)
pCO2, Ven: 52.1 mmHg (ref 44–60)
pH, Ven: 7.377 (ref 7.25–7.43)
pO2, Ven: 137 mmHg — ABNORMAL HIGH (ref 32–45)

## 2021-05-11 LAB — RESP PANEL BY RT-PCR (FLU A&B, COVID) ARPGX2
Influenza A by PCR: NEGATIVE
Influenza B by PCR: NEGATIVE
SARS Coronavirus 2 by RT PCR: NEGATIVE

## 2021-05-11 MED ORDER — ALBUTEROL SULFATE (2.5 MG/3ML) 0.083% IN NEBU
2.5000 mg | INHALATION_SOLUTION | RESPIRATORY_TRACT | Status: DC | PRN
  Administered 2021-05-11 – 2021-05-12 (×2): 2.5 mg via RESPIRATORY_TRACT
  Administered 2021-05-12: 5 mg via RESPIRATORY_TRACT
  Filled 2021-05-11 (×3): qty 3

## 2021-05-11 MED ORDER — NICOTINE 21 MG/24HR TD PT24
21.0000 mg | MEDICATED_PATCH | Freq: Every day | TRANSDERMAL | Status: DC
  Administered 2021-05-11 – 2021-05-13 (×2): 21 mg via TRANSDERMAL
  Filled 2021-05-11 (×3): qty 1

## 2021-05-11 MED ORDER — ONDANSETRON HCL 4 MG/2ML IJ SOLN: 4.0000 mg | Freq: Four times a day (QID) | INTRAMUSCULAR | Status: DC | PRN

## 2021-05-11 MED ORDER — FOLIC ACID 1 MG PO TABS
1.0000 mg | ORAL_TABLET | Freq: Every day | ORAL | Status: DC
  Administered 2021-05-11 – 2021-05-13 (×3): 1 mg via ORAL
  Filled 2021-05-11 (×3): qty 1

## 2021-05-11 MED ORDER — LORAZEPAM 1 MG PO TABS: 1.0000 mg | ORAL_TABLET | ORAL | Status: DC | PRN

## 2021-05-11 MED ORDER — QUETIAPINE FUMARATE 100 MG PO TABS
100.0000 mg | ORAL_TABLET | Freq: Every day | ORAL | Status: DC
  Administered 2021-05-11 – 2021-05-12 (×2): 100 mg via ORAL
  Filled 2021-05-11 (×3): qty 1

## 2021-05-11 MED ORDER — PRAZOSIN HCL 1 MG PO CAPS
1.0000 mg | ORAL_CAPSULE | Freq: Every day | ORAL | Status: DC
  Administered 2021-05-11 – 2021-05-12 (×2): 1 mg via ORAL
  Filled 2021-05-11 (×4): qty 1

## 2021-05-11 MED ORDER — MONTELUKAST SODIUM 10 MG PO TABS
10.0000 mg | ORAL_TABLET | Freq: Every day | ORAL | Status: DC
  Administered 2021-05-11 – 2021-05-12 (×2): 10 mg via ORAL
  Filled 2021-05-11 (×3): qty 1

## 2021-05-11 MED ORDER — LORAZEPAM 2 MG/ML IJ SOLN: 0.0000 mg | Freq: Four times a day (QID) | INTRAMUSCULAR | Status: DC

## 2021-05-11 MED ORDER — SODIUM CHLORIDE 0.9% FLUSH
3.0000 mL | Freq: Two times a day (BID) | INTRAVENOUS | Status: DC
  Administered 2021-05-11 – 2021-05-13 (×4): 3 mL via INTRAVENOUS

## 2021-05-11 MED ORDER — FLUOXETINE HCL 20 MG PO CAPS
40.0000 mg | ORAL_CAPSULE | Freq: Every morning | ORAL | Status: DC
  Administered 2021-05-11 – 2021-05-13 (×3): 40 mg via ORAL
  Filled 2021-05-11 (×3): qty 2

## 2021-05-11 MED ORDER — ALBUTEROL SULFATE (2.5 MG/3ML) 0.083% IN NEBU
10.0000 mg/h | INHALATION_SOLUTION | RESPIRATORY_TRACT | Status: DC
  Administered 2021-05-11: 10 mg/h via RESPIRATORY_TRACT
  Filled 2021-05-11: qty 12

## 2021-05-11 MED ORDER — TRAZODONE HCL 50 MG PO TABS
150.0000 mg | ORAL_TABLET | Freq: Every evening | ORAL | Status: DC | PRN
  Administered 2021-05-11 – 2021-05-12 (×2): 150 mg via ORAL
  Filled 2021-05-11 (×2): qty 3

## 2021-05-11 MED ORDER — THIAMINE HCL 100 MG PO TABS
100.0000 mg | ORAL_TABLET | Freq: Every day | ORAL | Status: DC
  Administered 2021-05-11 – 2021-05-13 (×3): 100 mg via ORAL
  Filled 2021-05-11 (×3): qty 1

## 2021-05-11 MED ORDER — LORAZEPAM 2 MG/ML IJ SOLN: 1.0000 mg | INTRAMUSCULAR | Status: DC | PRN

## 2021-05-11 MED ORDER — PREDNISONE 20 MG PO TABS
40.0000 mg | ORAL_TABLET | Freq: Every day | ORAL | Status: DC
  Administered 2021-05-12: 40 mg via ORAL
  Filled 2021-05-11: qty 2

## 2021-05-11 MED ORDER — IPRATROPIUM BROMIDE 0.02 % IN SOLN
0.5000 mg | Freq: Once | RESPIRATORY_TRACT | Status: AC
Start: 2021-05-11 — End: 2021-05-11
  Administered 2021-05-11: 0.5 mg via RESPIRATORY_TRACT
  Filled 2021-05-11: qty 2.5

## 2021-05-11 MED ORDER — ACETAMINOPHEN 325 MG PO TABS: 650.0000 mg | ORAL_TABLET | Freq: Four times a day (QID) | ORAL | Status: DC | PRN

## 2021-05-11 MED ORDER — IPRATROPIUM-ALBUTEROL 0.5-2.5 (3) MG/3ML IN SOLN
3.0000 mL | Freq: Four times a day (QID) | RESPIRATORY_TRACT | Status: DC
Start: 2021-05-11 — End: 2021-05-12
  Administered 2021-05-11 – 2021-05-12 (×6): 3 mL via RESPIRATORY_TRACT
  Filled 2021-05-11 (×6): qty 3

## 2021-05-11 MED ORDER — ACETAMINOPHEN 650 MG RE SUPP: 650.0000 mg | Freq: Four times a day (QID) | RECTAL | Status: DC | PRN

## 2021-05-11 MED ORDER — HYDROXYZINE HCL 25 MG PO TABS: 50.0000 mg | ORAL_TABLET | Freq: Three times a day (TID) | ORAL | Status: DC | PRN

## 2021-05-11 MED ORDER — GUAIFENESIN ER 600 MG PO TB12
600.0000 mg | ORAL_TABLET | Freq: Two times a day (BID) | ORAL | Status: DC
  Administered 2021-05-11 – 2021-05-13 (×5): 600 mg via ORAL
  Filled 2021-05-11 (×5): qty 1

## 2021-05-11 MED ORDER — ENOXAPARIN SODIUM 40 MG/0.4ML IJ SOSY
40.0000 mg | PREFILLED_SYRINGE | INTRAMUSCULAR | Status: DC
  Administered 2021-05-11 – 2021-05-13 (×3): 40 mg via SUBCUTANEOUS
  Filled 2021-05-11 (×3): qty 0.4

## 2021-05-11 MED ORDER — ADULT MULTIVITAMIN W/MINERALS CH
1.0000 | ORAL_TABLET | Freq: Every day | ORAL | Status: DC
  Administered 2021-05-11 – 2021-05-13 (×3): 1 via ORAL
  Filled 2021-05-11 (×3): qty 1

## 2021-05-11 MED ORDER — THIAMINE HCL 100 MG/ML IJ SOLN: 100.0000 mg | Freq: Every day | INTRAMUSCULAR | Status: DC

## 2021-05-11 MED ORDER — SODIUM CHLORIDE 0.9 % IV SOLN: INTRAVENOUS | Status: DC

## 2021-05-11 MED ORDER — HYDRALAZINE HCL 20 MG/ML IJ SOLN: 10.0000 mg | INTRAMUSCULAR | Status: DC | PRN

## 2021-05-11 MED ORDER — GABAPENTIN 100 MG PO CAPS
200.0000 mg | ORAL_CAPSULE | Freq: Three times a day (TID) | ORAL | Status: DC
  Administered 2021-05-11 – 2021-05-13 (×7): 200 mg via ORAL
  Filled 2021-05-11 (×7): qty 2

## 2021-05-11 MED ORDER — LORAZEPAM 2 MG/ML IJ SOLN: 0.0000 mg | Freq: Two times a day (BID) | INTRAMUSCULAR | Status: DC

## 2021-05-11 MED ORDER — ONDANSETRON HCL 4 MG PO TABS: 4.0000 mg | ORAL_TABLET | Freq: Four times a day (QID) | ORAL | Status: DC | PRN

## 2021-05-11 NOTE — Progress Notes (Signed)
  CAT started.  

## 2021-05-11 NOTE — H&P (Signed)
?History and Physical  ? ? ?Patient: Rachel Nolan [id number] DOB: 1970/07/29 ?DOA: 05/11/2021 ?DOS: the patient was seen and examined on 05/11/2021 ?PCP: Pcp, No  ?Patient coming from: Home via EMS ? ?Chief Complaint:  ?Chief Complaint  ?Patient presents with  ? Shortness of Breath  ? ?HPI: Rachel Nolan is a 51 y.o. female with medical history significant of hypertension, asthma, chronic respiratory failure on 2 L, anxiety, depression, and polysubstance abuse (alcohol, cocaine, marijuana, and tobacco) who presents with complaints of shortness of breath starting last night.  She reports that her sister has been cleaning the kitchen something because there was mold underneath the sink.  Associated symptoms included wheezing, chest tightness, and productive cough with whitish sputum.  Denies having any significant fever, abdominal pain, nausea, vomiting, or diarrhea.  Ms. Fillingham still smokes about a pack cigarettes per day on average and last used cocaine 3 days ago.  She is only drinking 1 beer per week.  She had tried her home nebulizer machine without improvement in symptoms which she called EMS.  In route with EMS patient was given Solu-Medrol 125 mg IV, 2 breathing treatments, and magnesium sulfate 2 g. ? ?On admission into the emergency department patient was noted to be afebrile with respirations 18-26, blood pressures elevated up to 162/118, and O2 saturation currently maintained on 2L.  Labs were unremarkable. Chest x-ray showed no acute disease.  Patient has been given a continuous hour-long breathing treatment.  TRH called to admit.  Influenza and COVID-19 screening pending. ? ?Review of Systems: As mentioned in the history of present illness. All other systems reviewed and are negative. ?Past Medical History:  ?Diagnosis Date  ? Anxiety   ? Asthma   ? COPD (chronic obstructive pulmonary disease) ([place])   ? Depression   ? Hypertension   ? Scoliosis   ? ?Past Surgical History:  ?Procedure Laterality  Date  ? BACK SURGERY    ? ECTOPIC PREGNANCY SURGERY    ? ?Social History:  reports that she has been smoking cigarettes. She has been smoking an average of .5 packs per day. She has never used smokeless tobacco. She reports current alcohol use. She reports current drug use. Drugs: Marijuana, Cocaine, and "Crack" cocaine. ? ?No Known Allergies ? ?Family History  ?Problem Relation Age of Onset  ? Hypertension Mother   ? Hypertension Father   ? ? ?Prior to Admission medications   ?Medication Sig Start Date End Date Taking? Authorizing Provider  ?albuterol (PROVENTIL) (2.5 MG/3ML) 0.083% nebulizer solution Take 2.5 mg by nebulization every 4 (four) hours as needed for wheezing or shortness of breath.   Yes [provider]  ?albuterol (VENTOLIN HFA) 108 (90 Base) MCG/ACT inhaler Inhale 2 puffs into the lungs every 4 (four) hours as needed for shortness of breath or wheezing. 04/08/21  Yes Clapacs, Madie Reno, MD  ?FLUoxetine (PROZAC) 40 MG capsule Take 1 capsule (40 mg total) by mouth every morning. 04/08/21  Yes Clapacs, Madie Reno, MD  ?gabapentin (NEURONTIN) 100 MG capsule Take 2 capsules (200 mg total) by mouth 3 (three) times daily. 04/08/21  Yes Clapacs, Madie Reno, MD  ?hydrOXYzine (ATARAX) 50 MG tablet Take 1 tablet (50 mg total) by mouth 3 (three) times daily as needed for anxiety. 04/08/21  Yes Clapacs, Madie Reno, MD  ?montelukast (SINGULAIR) 10 MG tablet Take 1 tablet (10 mg total) by mouth at bedtime. 04/08/21  Yes Clapacs, Madie Reno, MD  ?nicotine (NICODERM CQ - DOSED IN MG/24 HOURS) 21 mg/24hr  patch Place 1 patch (21 mg total) onto the skin daily. 04/08/21  Yes Clapacs, Jackquline Denmark, MD  ?prazosin (MINIPRESS) 1 MG capsule Take 1 capsule (1 mg total) by mouth at bedtime. 04/08/21  Yes Clapacs, Jackquline Denmark, MD  ?QUEtiapine (SEROQUEL) 100 MG tablet Take 1 tablet (100 mg total) by mouth at bedtime. 04/08/21  Yes Clapacs, Jackquline Denmark, MD  ?traZODone (DESYREL) 150 MG tablet Take 1 tablet (150 mg total) by mouth at bedtime as needed for sleep.  04/08/21  Yes Clapacs, Jackquline Denmark, MD  ?ibuprofen (ADVIL) 200 MG tablet Take 400 mg by mouth every 6 (six) hours as needed for headache or moderate pain.    [provider]  ?NON FORMULARY CPAP at bedtime    [provider]  ?OXYGEN Inhale 2 L into the lungs as needed (shortness of breath).    [provider]  ?predniSONE (STERAPRED UNI-PAK 21 TAB) 10 MG (21) TBPK tablet Use per package instruction ?Patient not taking: Reported on 05/11/2021 04/22/21   Elgergawy, Leana Roe, MD  ?citalopram (CELEXA) 20 MG tablet Take 1 tablet (20 mg total) by mouth daily. 03/07/19 07/23/19  Aldean Baker, NP  ?lisinopril (ZESTRIL) 10 MG tablet Take 1 tablet (10 mg total) by mouth daily. 03/07/19 09/11/19  Aldean Baker, NP  ?pantoprazole (PROTONIX) 40 MG tablet Take 1 tablet (40 mg total) by mouth daily. 03/07/19 07/23/19  Aldean Baker, NP  ? ? ?Physical Exam: ?Vitals:  ? 05/11/21 0745 05/11/21 0815 05/11/21 0830 05/11/21 0859  ?BP: 130/89 (!) 142/100 119/84   ?Pulse: 72 76 86   ?Resp: (!) 21 (!) 21 18   ?Temp:      ?TempSrc:      ?SpO2: 99% 99% 100% 94%  ?Weight:      ?Height:      ? ? ? ?Constitutional: Middle-aged female who appears to be lethargic, but we will awaken and follow commands l ?Eyes: PERRL, lids and conjunctivae normal ?ENMT: Mucous membranes are moist. Posterior pharynx clear of any exudate or lesions.  ?Neck: normal, supple, no masses, no thyromegaly ?Respiratory: Diffuse wheezes heard throughout both lung fields.  Patient able to talk in shortened sentences.  O2 saturation currently maintained on home 2 L of nasal cannula oxygen. ?Cardiovascular: Regular rate and rhythm, no murmurs / rubs / gallops. No extremity edema.  ?Abdomen: no tenderness and bowel sounds present all 4 quadrants. ?Musculoskeletal: no clubbing / cyanosis. No joint deformity upper and lower extremities    ?Skin: no rashes, lesions, ulcers. No induration ?Neurologic: CN 2-12 grossly intact.  . Strength 5/5 in all 4.  ?Psychiatric:  Lethargic, but oriented x3. ? ?Data Reviewed: ? ?EKG significant for sinus rhythm at 94 bpm with QTc 497. ? ?Assessment and Plan: ?Chronic respiratory failure with hypoxia secondary to asthma exacerbation ?Patient presents with complaints of shortness of breath, cough, wheezing that started last night.  O2 saturations currently maintained on home 2 L of oxygen.  Thought provoked secondary to sister cleaning kitchen for mold.  Chest x-ray otherwise noted to be clear.  On physical exam patient with diffuse bilateral wheezes.  Treated with Solu-Medrol 125 mg IV, magnesium sulfate 2 g IV, and multiple breathing treatments.  Suspect possibly provoked from either housecleaning and/or recent cocaine use. ?-Admit to a progressive bed ?-Continuous pulse oximetry with nasal cannula oxygen to maintain O2 saturation greater than 92% ?-Follow-up COVID-19 and influenza screen ?-Peak flow monitoring ?-DuoNebs 4 times daily and albuterol nebs as needed ?-Prednisone  ?-Continue  Singulair ?-Mucinex ? ?Essential hypertension ?Blood pressures initially elevated at 155/118.  Patient does not appear to be on amlodipine which had previously been prescribed. ?-Hydralazine IV as needed elevated blood pressures ?-Consider resuming amlodipine ? ?Anxiety and depression PTSD ?Home medication regimen includes prazosin 1 mg nightly, Prozac 40 mg daily, hydroxyzine 50 mg 3 times daily as needed for anxiety, and Seroquel 100 mg daily ?-Continue home medication regimen ? ?Tobacco abuse cocaine abuse ?Patient admits to last using cocaine 3 days ago, still smokes about a pack of cigarettes per day on average, and only reports drinking 1 beer per week currently. ?-Follow-up UDS ?-CIWA protocols with Ativan IV as needed  ?-Nicotine patch ?-Continue to counsel on need of cessation of tobacco and cocaine ? ?OSA ?-Continue CPAP nightly ? ?Advance Care Planning:   Code Status: Full Code    ? ?Consults: None ? ?Family Communication: None requested when  asked ? ?Severity of Illness: ?The appropriate patient status for this patient is OBSERVATION. Observation status is judged to be reasonable and necessary in order to provide the required intensity of service

## 2021-05-11 NOTE — ED Notes (Signed)
Pt noted with an O2 sat of 86%. Assessment of pt showed she was sleeping and when asked if she wears o2 at home she stated that she is on 2L at all times. 2L [place] O2 placed on pt and sat up to 95% ?

## 2021-05-11 NOTE — ED Provider Notes (Signed)
?[facility] ?Provider Note ? ?CSN: [id number] ?Arrival date & time: 05/11/21 [id number] ? ?Chief Complaint(s) ?Shortness of Breath ? ?HPI ?Rachel LandrySandra Nolan is a 51 y.o. female with a past medical history listed below including asthma/COPD on 2 L nasal cannula with exertion here for several hours of shortness of breath consistent with COPD exacerbation.  She reports that her sister was using some chemicals to clean mold in the kitchen.  She believes that this irritated her COPD/asthma.  She attempted to use breathing treatments at home which were unsuccessful prompting her call to EMS.  In route EMS noted bilateral wheezing with diminished lung sounds.  Patient received 2 nebs with Solu-Medrol and 2 g of magnesium.  Patient reports feeling slightly improved.  Endorsing chest tightness.  Also reports nonproductive cough.  No nausea or vomiting.  No abdominal pain.  No extremity edema.. ? ?The history is provided by the patient.  ? ?Past Medical History ?Past Medical History:  ?Diagnosis Date  ? Anxiety   ? Asthma   ? COPD (chronic obstructive pulmonary disease) (HCC)   ? Depression   ? Hypertension   ? Scoliosis   ? ?Patient Active Problem List  ? Diagnosis Date Noted  ? Acute on chronic respiratory failure with hypoxemia (HCC) 04/20/2021  ? History of substance abuse (HCC) 04/20/2021  ? Acute asthma exacerbation 02/12/2021  ? Nicotine dependence, cigarettes, uncomplicated 02/12/2021  ? Asthma exacerbation 02/12/2021  ? Acute bronchitis 09/24/2020  ? Mild asthma exacerbation 09/24/2020  ? Tobacco abuse 09/24/2020  ? Abnormal weight loss 09/24/2020  ? Tachycardia 09/23/2020  ? Asthma 09/23/2020  ? Alcohol abuse 09/21/2020  ? Homeless 08/22/2020  ? Cocaine abuse (HCC) 07/09/2020  ? Opioid use disorder, moderate, dependence (HCC) 05/16/2020  ? Adjustment disorder with depressed mood 04/24/2020  ? 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  ? Essential hypertension 03/26/2020  ?  Neuropathic pain 03/26/2020  ? Severe recurrent major depression with psychotic features (HCC) 03/25/2020  ? Major depressive disorder with psychotic features (HCC) 01/24/2020  ? Stimulant use disorder 01/24/2020  ? Alcohol use disorder, severe, dependence (HCC) 12/06/2019  ? Alcohol withdrawal (HCC) 11/28/2019  ? Suicidal ideation 11/28/2019  ? Cocaine dependence (HCC) 03/05/2019  ? Major depressive disorder, recurrent episode, severe (HCC) 03/02/2019  ? Post traumatic stress disorder (PTSD) 03/02/2019  ? Cocaine use disorder (HCC) 03/02/2019  ? Anxiety and depression 03/02/2019  ? Substance induced mood disorder (HCC) 03/02/2019  ? MDD (major depressive disorder) 03/01/2019  ? ?Home Medication(s) ?Prior to Admission medications   ?Medication Sig Start Date End Date Taking? Authorizing Provider  ?albuterol (VENTOLIN HFA) 108 (90 Base) MCG/ACT inhaler Inhale 2 puffs into the lungs every 4 (four) hours as needed for shortness of breath or wheezing. 04/08/21   Clapacs, Jackquline DenmarkJohn T, MD  ?FLUoxetine (PROZAC) 40 MG capsule Take 1 capsule (40 mg total) by mouth every morning. 04/08/21   Clapacs, Jackquline DenmarkJohn T, MD  ?gabapentin (NEURONTIN) 100 MG capsule Take 2 capsules (200 mg total) by mouth 3 (three) times daily. 04/08/21   Clapacs, Jackquline DenmarkJohn T, MD  ?hydrOXYzine (ATARAX) 50 MG tablet Take 1 tablet (50 mg total) by mouth 3 (three) times daily as needed for anxiety. 04/08/21   Clapacs, Jackquline DenmarkJohn T, MD  ?ibuprofen (ADVIL) 200 MG tablet Take 400 mg by mouth every 6 (six) hours as needed for headache or moderate pain.    [provider]  ?montelukast (SINGULAIR) 10 MG tablet Take 1 tablet (10 mg total) by mouth at  bedtime. 04/08/21   Clapacs, Jackquline Denmark, MD  ?nicotine (NICODERM CQ - DOSED IN MG/24 HOURS) 21 mg/24hr patch Place 1 patch (21 mg total) onto the skin daily. 04/08/21   Clapacs, Jackquline Denmark, MD  ?NON FORMULARY CPAP at bedtime    [provider]  ?OXYGEN Inhale 2 L into the lungs as needed (shortness of breath).    [provider]  ?prazosin (MINIPRESS) 1 MG capsule Take 1 capsule (1 mg total) by mouth at bedtime. 04/08/21   Clapacs, Jackquline Denmark, MD  ?predniSONE (STERAPRED UNI-PAK 21 TAB) 10 MG (21) TBPK tablet Use per package instruction 04/22/21   Elgergawy, Leana Roe, MD  ?QUEtiapine (SEROQUEL) 100 MG tablet Take 1 tablet (100 mg total) by mouth at bedtime. 04/08/21   Clapacs, Jackquline Denmark, MD  ?traZODone (DESYREL) 150 MG tablet Take 1 tablet (150 mg total) by mouth at bedtime as needed for sleep. 04/08/21   Clapacs, Jackquline Denmark, MD  ?citalopram (CELEXA) 20 MG tablet Take 1 tablet (20 mg total) by mouth daily. 03/07/19 07/23/19  Aldean Baker, NP  ?lisinopril (ZESTRIL) 10 MG tablet Take 1 tablet (10 mg total) by mouth daily. 03/07/19 09/11/19  Aldean Baker, NP  ?pantoprazole (PROTONIX) 40 MG tablet Take 1 tablet (40 mg total) by mouth daily. 03/07/19 07/23/19  Aldean Baker, NP  ?                                                                                                                                  ?Allergies ?Patient has no known allergies. ? ?Review of Systems ?Review of Systems ?As noted in HPI ? ?Physical Exam ?Vital Signs  ?I have reviewed the triage vital signs ?BP (!) 155/118   Pulse 85   Temp (!) 97.1 ?F (36.2 ?C) (Axillary)   Resp (!) 24   Ht 5\' 6"  (1.676 m)   Wt 83 kg   LMP 06/19/2018 (Exact Date)   SpO2 100%   BMI 29.54 kg/m?  ? ?Physical Exam ?Vitals reviewed.  ?Constitutional:   ?   General: She is not in acute distress. ?   Appearance: She is well-developed. She is not diaphoretic.  ?HENT:  ?   Head: Normocephalic and atraumatic.  ?   Nose: Nose normal.  ?Eyes:  ?   General: No scleral icterus.    ?   Right eye: No discharge.     ?   Left eye: No discharge.  ?   Conjunctiva/sclera: Conjunctivae normal.  ?   Pupils: Pupils are equal, round, and reactive to light.  ?Cardiovascular:  ?   Rate and Rhythm: Normal rate and regular rhythm.  ?   Heart sounds: No murmur heard. ?  No friction rub. No gallop.  ?Pulmonary:  ?    Effort: Tachypnea and respiratory distress present.  ?   Breath sounds: No stridor. Wheezing (insp and exp) present. No rales.  ?Abdominal:  ?  General: There is no distension.  ?   Palpations: Abdomen is soft.  ?   Tenderness: There is no abdominal tenderness.  ?Musculoskeletal:     ?   General: No tenderness.  ?   Cervical back: Normal range of motion and neck supple.  ?   Right lower leg: No edema.  ?   Left lower leg: No edema.  ?Skin: ?   General: Skin is warm and dry.  ?   Findings: No erythema or rash.  ?Neurological:  ?   Mental Status: She is alert and oriented to person, place, and time.  ? ? ?ED Results and Treatments ?Labs ?(all labs ordered are listed, but only abnormal results are displayed) ?Labs Reviewed  ?CBC WITH DIFFERENTIAL/PLATELET  ?COMPREHENSIVE METABOLIC PANEL  ?I-STAT VENOUS BLOOD GAS, ED  ?                                                                                                                       ?EKG ? EKG Interpretation ? ?Date/Time:  Monday May 11 2021 19:37:90 EDT ?Ventricular Rate:  94 ?PR Interval:    ?QRS Duration: 96 ?QT Interval:  394 ?QTC Calculation: 493 ?R Axis:   76 ?Text Interpretation: Sinus rhythm Borderline prolonged QT interval Baseline wander Confirmed by Drema Pry [phone]) on 05/11/2021 7:12:42 AM ?  ? ?  ? ?Radiology ?DG Chest Port 1 View ? ?Result Date: 05/11/2021 ?CLINICAL DATA:  Shortness of breath. EXAM: PORTABLE CHEST 1 VIEW COMPARISON:  04/20/2021 FINDINGS: 0626 hours. The lungs are clear without focal pneumonia, edema, pneumothorax or pleural effusion. Cardiopericardial silhouette is at upper limits of normal for size. Thoracolumbar scoliosis noted with associated fusion hardware. Telemetry leads overlie the chest. IMPRESSION: No active disease. Electronically Signed   By: Kennith Center M.D.   On: 05/11/2021 06:40   ? ?Pertinent labs & imaging results that were available during my care of the patient were reviewed by me and considered in my medical  decision making (see MDM for details). ? ?Medications Ordered in ED ?Medications  ?albuterol (PROVENTIL) (2.5 MG/3ML) 0.083% nebulizer solution (10 mg/hr Nebulization New Bag/Given 05/11/21 0642)  ?ipratropium (ATROVENT) nebulize

## 2021-05-11 NOTE — ED Triage Notes (Signed)
Patient presents to ed via [facility] states she was awaken approx. 2 hours ago with sob , upon ems arrival patient had bilateral wheezing and demished  lung sounds, c/o chest feeling tight. Patient was given solu-medrol 125 mg , 2 nebs, and Mag 2gm. , states she used her Neb at home without relief. Upon arrival audible wheezing and coughing. Neb inprogress ?

## 2021-05-11 NOTE — ED Provider Notes (Signed)
Blood pressure (!) 146/97, pulse 99, temperature (!) 97.1 ?F (36.2 ?C), temperature source Axillary, resp. rate (!) 25, height 5\' 6"  (1.676 m), weight 83 kg, last menstrual period 06/19/2018, SpO2 99 %. ? ?Assuming care from Dr. 08/19/2018.  In short, Rachel Nolan is a 51 y.o. female with a chief complaint of Shortness of Breath ?44  Refer to the original H&P for additional details. ? ?The current plan of care is to follow up after nebs and labs resulted. ? ?08:17 AM  ?Patient's labs are reassuring. No AKI or anemia. CO2 of VBG WNL. No acidosis. Patient completing her continuous albuterol. CXR without infiltrate or edema.  ? ?10:14 AM  ?Discussed patient's case with TR[facility], Dr. Marland Kitchen to request admission. Patient and family (if present) updated with plan.  ? ? ?  ?Katrinka Blazing, MD ?05/11/21 1015 ? ?

## 2021-05-12 DIAGNOSIS — F1911 Other psychoactive substance abuse, in remission: Secondary | ICD-10-CM

## 2021-05-12 LAB — CBC
HCT: 34.6 % — ABNORMAL LOW (ref 36.0–46.0)
Hemoglobin: 11.6 g/dL — ABNORMAL LOW (ref 12.0–15.0)
MCH: 31.1 pg (ref 26.0–34.0)
MCHC: 33.5 g/dL (ref 30.0–36.0)
MCV: 92.8 fL (ref 80.0–100.0)
Platelets: 206 10*3/uL (ref 150–400)
RBC: 3.73 MIL/uL — ABNORMAL LOW (ref 3.87–5.11)
RDW: 15.4 % (ref 11.5–15.5)
WBC: 6.5 10*3/uL (ref 4.0–10.5)
nRBC: 0 % (ref 0.0–0.2)

## 2021-05-12 LAB — BASIC METABOLIC PANEL
Anion gap: 6 (ref 5–15)
BUN: 13 mg/dL (ref 6–20)
CO2: 27 mmol/L (ref 22–32)
Calcium: 9.3 mg/dL (ref 8.9–10.3)
Chloride: 107 mmol/L (ref 98–111)
Creatinine, Ser: 0.84 mg/dL (ref 0.44–1.00)
GFR, Estimated: >60 mL/min (ref >60)
Glucose, Bld: 97 mg/dL (ref 70–99)
Potassium: 4.1 mmol/L (ref 3.5–5.1)
Sodium: 140 mmol/L (ref 135–145)

## 2021-05-12 LAB — BLOOD GAS, ARTERIAL
Acid-Base Excess: 4.7 mmol/L — ABNORMAL HIGH (ref 0.0–2.0)
Bicarbonate: 30.9 mmol/L — ABNORMAL HIGH (ref 20.0–28.0)
Drawn by: 336832
O2 Saturation: 99.4 %
Patient temperature: 37
pCO2 arterial: 51 mmHg — ABNORMAL HIGH (ref 32–48)
pH, Arterial: 7.39 (ref 7.35–7.45)
pO2, Arterial: 108 mmHg (ref 83–108)

## 2021-05-12 LAB — MAGNESIUM: Magnesium: 2.2 mg/dL (ref 1.7–2.4)

## 2021-05-12 LAB — PHOSPHORUS: Phosphorus: 3.5 mg/dL (ref 2.5–4.6)

## 2021-05-12 MED ORDER — ALBUTEROL SULFATE (2.5 MG/3ML) 0.083% IN NEBU: 5.0000 mg | INHALATION_SOLUTION | RESPIRATORY_TRACT | Status: DC | PRN

## 2021-05-12 MED ORDER — METHYLPREDNISOLONE SODIUM SUCC 40 MG IJ SOLR
40.0000 mg | Freq: Two times a day (BID) | INTRAMUSCULAR | Status: DC
  Administered 2021-05-12 – 2021-05-13 (×2): 40 mg via INTRAVENOUS
  Filled 2021-05-12 (×2): qty 1

## 2021-05-12 MED ORDER — METHYLPREDNISOLONE SODIUM SUCC 40 MG IJ SOLR
40.0000 mg | Freq: Two times a day (BID) | INTRAMUSCULAR | Status: DC
Start: 2021-05-12 — End: 2021-05-12

## 2021-05-12 MED ORDER — IPRATROPIUM-ALBUTEROL 0.5-2.5 (3) MG/3ML IN SOLN
3.0000 mL | RESPIRATORY_TRACT | Status: DC
  Administered 2021-05-12 – 2021-05-13 (×5): 3 mL via RESPIRATORY_TRACT
  Filled 2021-05-12 (×5): qty 3

## 2021-05-12 MED ORDER — AMLODIPINE BESYLATE 5 MG PO TABS
5.0000 mg | ORAL_TABLET | Freq: Every day | ORAL | Status: DC
Start: 2021-05-12 — End: 2021-05-13
  Administered 2021-05-13: 5 mg via ORAL
  Filled 2021-05-12 (×2): qty 1

## 2021-05-12 NOTE — Progress Notes (Signed)
?  Transition of Care (TOC) Screening Note ? ? ?Patient Details  ?Name: Rachel Nolan ?Date of Birth: 03-26-1970 ? ? ?Transition of Care (TOC) CM/SW Contact:    ?Benard Halsted, LCSW ?Phone Number: ?05/12/2021, 8:32 AM ? ? ? ?Transition of Care Department [facility]) has reviewed patient. Recent admission where patient left AMA. We will continue to monitor patient advancement through interdisciplinary progression rounds. If new patient transition needs arise, please place a TOC consult. ? ? ?

## 2021-05-12 NOTE — Progress Notes (Signed)
Pt refused at this time.  Patient states that she does not wear CPAP at home.RT will continue to monitor. ?

## 2021-05-12 NOTE — Progress Notes (Signed)
?PROGRESS NOTE ? ? ? ?Rachel Nolan  AYT:[id number] DOB: [birth year] DOA: 05/11/2021 ?PCP: Pcp, No  ? ? ?Brief Narrative:  ?Rachel Nolan is a 51 y.o. female with medical history significant of hypertension, asthma, chronic respiratory failure on 2 L, anxiety, depression, and polysubstance abuse (alcohol, cocaine, marijuana, and tobacco) presented hospital with shortness of breath for 1 day with wheezing and chest tightness productive cough.  Patient continues to smoke and had used cocaine 3 days prior to presentation.  She tried nebulizer at home without much improvement.  EMS administered Solu-Medrol 125 mg IV, 2 breathing treatments and magnesium sulfate 2 g.  Patient was then brought into the hospital.  In the ED patient was mildly hypertensive, tachypneic.  On 2 L of nasal cannula at baseline.  Chest x-ray did not show any acute infiltrate.  Influenza and COVID was negative.  Patient was then admitted hospital for further evaluation and treatment.  ?  ?Assessment and Plan: ? ?Principal Problem: ?  Asthma exacerbation ?Active Problems: ?  Chronic respiratory failure with hypoxia (HCC) ?  Anxiety and depression ?  Essential hypertension ?  Cocaine abuse (HCC) ?  Tobacco abuse ?  History of substance abuse (HCC) ?  ?Chronic respiratory failure with hypoxia secondary to asthma exacerbation ?Question for exposure to mold exacerbating her asthma.  Patient however continues to smoke and had used cocaine recently.  Received DuoNebs Solu-Medrol Singulair Mucinex.  Has been transitioned to prednisone.  We will continue to monitor closely. ? ?Essential hypertension ?Was elevated on presentation.  On prazosin.  Was supposed to be on amlodipine but was not taking.  We will initiate amlodipine for elevated blood pressure. ?  ?Anxiety and depression PTSD ?On prazosin 1 mg nightly, Prozac 40 mg daily, hydroxyzine 50 mg 3 times daily as needed for anxiety, and Seroquel 100 mg daily.  Has been resumed at this time. ? ?Tobacco  abuse cocaine abuse alcohol use disorder. ?Still smoking a pack of cigarettes.  Continue nicotine patch, CIWA protocol, counseling done. ?  ?OSA ?-Continue CPAP nightly  ? ? DVT prophylaxis: enoxaparin (LOVENOX) injection 40 mg Start: 05/11/21 1200 ? ? ?Code Status:   ?  Code Status: Full Code ? ?Disposition: Home ?Status is: Observation ? ?The patient will require care spanning > 2 midnights and should be moved to inpatient because: Asthma exacerbation still with wheezing, pending clinical improvement, IV steroids ? ? Family Communication:  ?Spoke with the patient at bedside. ? ?Consultants:  ?None ? ?Procedures:  ?None ? ?Antimicrobials:  ?None ? ?Anti-infectives (From admission, onward)  ? ? None  ? ?  ? ?Subjective: ?Today, patient was seen and examined at bedside.  Still complains of cough, wheezing, dyspnea and shortness of breath. ? ?Objective: ?Vitals:  ? 05/12/21 0400 05/12/21 0753 05/12/21 0825 05/12/21 1040  ?BP: (!) 137/116 (!) 130/103    ?Pulse: 94 100    ?Resp: 20 19    ?Temp: 97.8 ?F (36.6 ?C) 98.2 ?F (36.8 ?C)    ?TempSrc: Axillary Oral    ?SpO2: 96% 95% 96% 95%  ?Weight:      ?Height:      ? ?No intake or output data in the 24 hours ending 05/12/21 1051 ?Filed Weights  ? 05/11/21 0612 05/11/21 2203  ?Weight: 83 kg 84.7 kg  ? ? ?Physical Examination: ? ?General:  Average built, not in obvious distress, on nasal cannula oxygen ?HENT:   No scleral pallor or icterus noted. Oral mucosa is moist.  ?Chest:    Diminished  breath sounds bilaterally.  Diffuse wheezing noted. ?CVS: S1 &S2 heard. No murmur.  Regular rate and rhythm. ?Abdomen: Soft, nontender, nondistended.  Bowel sounds are heard.   ?Extremities: No cyanosis, clubbing or edema.  Peripheral pulses are palpable. ?Psych: Alert, awake and oriented, normal mood ?CNS:  No cranial nerve deficits.  Power equal in all extremities.   ?Skin: Warm and dry.  No rashes noted. ? ?Data Reviewed:  ? ?CBC: ?Recent Labs  ?Lab 05/11/21 ?0655 05/11/21 ?0713  05/12/21 ?0114  ?WBC 5.4  --  6.5  ?NEUTROABS 2.9  --   --   ?HGB 12.9 13.6 11.6*  ?HCT 39.5 40.0 34.6*  ?MCV 94.3  --  92.8  ?PLT 204  --  206  ? ? ?Basic Metabolic Panel: ?Recent Labs  ?Lab 05/11/21 ?0655 05/11/21 ?0713 05/12/21 ?0114  ?NA 141 141 140  ?K 3.6 3.6 4.1  ?CL 106  --  107  ?CO2 26  --  27  ?GLUCOSE 122*  --  97  ?BUN 6  --  13  ?CREATININE 0.64  --  0.84  ?CALCIUM 9.3  --  9.3  ?MG  --   --  2.2  ?PHOS  --   --  3.5  ? ? ?Liver Function Tests: ?Recent Labs  ?Lab 05/11/21 ?0655  ?AST 14*  ?ALT 10  ?ALKPHOS 67  ?BILITOT 0.4  ?PROT 7.0  ?ALBUMIN 3.8  ? ? ? ?Radiology Studies: ?DG Chest Port 1 View ? ?Result Date: 05/11/2021 ?CLINICAL DATA:  Shortness of breath. EXAM: PORTABLE CHEST 1 VIEW COMPARISON:  04/20/2021 FINDINGS: 0626 hours. The lungs are clear without focal pneumonia, edema, pneumothorax or pleural effusion. Cardiopericardial silhouette is at upper limits of normal for size. Thoracolumbar scoliosis noted with associated fusion hardware. Telemetry leads overlie the chest. IMPRESSION: No active disease. Electronically Signed   By: Kennith Center M.D.   On: 05/11/2021 06:40   ? ? ? LOS: 0 days  ? ? ?Joycelyn Das, MD ?[facility] ?Available via Epic secure chat 7am-7pm ?After these hours, please refer to coverage provider listed on [email or web address] ?05/12/2021, 10:51 AM  ? ? ?

## 2021-05-12 NOTE — Hospital Course (Addendum)
Rachel Nolan is a 51 y.o. female with medical history significant of hypertension, asthma, chronic respiratory failure on 2 L, anxiety, depression, and polysubstance abuse (alcohol, cocaine, marijuana, and tobacco) presented hospital with shortness of breath for 1 day with wheezing and chest tightness productive cough.  Patient continues to smoke and had used cocaine 3 days prior to presentation.  She tried nebulizer at home without much improvement.  EMS administered Solu-Medrol 125 mg IV, 2 breathing treatments and magnesium sulfate 2 g.  Patient was then brought into the hospital.  In the ED patient was mildly hypertensive, tachypneic.  On 2 L of nasal cannula at baseline.  Chest x-ray did not show any acute infiltrate.  Influenza and COVID was negative.  Patient was then admitted hospital for further evaluation and treatment.  ?  ?Assessment and Plan: ? ?Active Problems: ?  Chronic respiratory failure with hypoxia (HCC) ?  Anxiety and depression ?  Essential hypertension ?  Cocaine abuse ([place]) ?  Tobacco abuse ?  History of substance abuse ([place]) ?  ?  ?Chronic respiratory failure with hypoxia secondary to asthma exacerbation ?Question for exposure to mold exacerbating her asthma.  Patient however continues to smoke and had used cocaine recently.  Received DuoNebs Solu-Medrol Singulair Mucinex.  Patient has symptomatically felt much better today and is at her baseline.  We will consider p.o. prednisone taper on discharge.  Patient has requested albuterol nebulizer liquid which will be refilled.  She was extensively counseled regarding quitting smoking and illicit substances ? ?Essential hypertension ?Continue prazosin and amlodipine.  ?  ?Anxiety and depression PTSD ?On prazosin 1 mg nightly, Prozac 40 mg daily, hydroxyzine 50 mg 3 times daily as needed for anxiety, and Seroquel 100 mg daily.  Continue on discharge ? ?Tobacco abuse cocaine abuse alcohol use disorder. ?Still smoking a pack of cigarettes.   Continue nicotine patch ?  ?OSA ?-Continue CPAP nightly ?

## 2021-05-12 NOTE — Plan of Care (Signed)
Patient alert and oriented

## 2021-05-12 NOTE — Progress Notes (Signed)
CPAP at bedside, set up and 3L 02 bleed in. Pt stated she wasn't ready for CPAP at this time.  Pt instructed to have RN call RT if pt needed assistance with it when pt was ready.  ?

## 2021-05-13 ENCOUNTER — Other Ambulatory Visit: Payer: Self-pay

## 2021-05-13 DIAGNOSIS — J45901 Unspecified asthma with (acute) exacerbation: Secondary | ICD-10-CM

## 2021-05-13 LAB — BASIC METABOLIC PANEL
Anion gap: 6 (ref 5–15)
BUN: 16 mg/dL (ref 6–20)
CO2: 28 mmol/L (ref 22–32)
Calcium: 9.4 mg/dL (ref 8.9–10.3)
Chloride: 106 mmol/L (ref 98–111)
Creatinine, Ser: 0.79 mg/dL (ref 0.44–1.00)
GFR, Estimated: >60 mL/min (ref >60)
Glucose, Bld: 151 mg/dL — ABNORMAL HIGH (ref 70–99)
Potassium: 4.3 mmol/L (ref 3.5–5.1)
Sodium: 140 mmol/L (ref 135–145)

## 2021-05-13 LAB — CBC
HCT: 35.6 % — ABNORMAL LOW (ref 36.0–46.0)
Hemoglobin: 11.8 g/dL — ABNORMAL LOW (ref 12.0–15.0)
MCH: 31.3 pg (ref 26.0–34.0)
MCHC: 33.1 g/dL (ref 30.0–36.0)
MCV: 94.4 fL (ref 80.0–100.0)
Platelets: 216 10*3/uL (ref 150–400)
RBC: 3.77 MIL/uL — ABNORMAL LOW (ref 3.87–5.11)
RDW: 15.5 % (ref 11.5–15.5)
WBC: 5.1 10*3/uL (ref 4.0–10.5)
nRBC: 0 % (ref 0.0–0.2)

## 2021-05-13 LAB — MAGNESIUM: Magnesium: 2.1 mg/dL (ref 1.7–2.4)

## 2021-05-13 MED ORDER — PREDNISONE 10 MG PO TABS
ORAL_TABLET | ORAL | 0 refills | Status: DC
  Filled 2021-05-13: qty 19, 7d supply, fill #0

## 2021-05-13 MED ORDER — ALBUTEROL SULFATE (2.5 MG/3ML) 0.083% IN NEBU
2.5000 mg | INHALATION_SOLUTION | RESPIRATORY_TRACT | 12 refills | Status: DC | PRN
  Filled 2021-05-13: qty 75, 5d supply, fill #0
  Filled 2021-05-28: qty 75, 5d supply, fill #1

## 2021-05-13 MED ORDER — AMLODIPINE BESYLATE 5 MG PO TABS
5.0000 mg | ORAL_TABLET | Freq: Every day | ORAL | 2 refills | Status: DC
Start: 2021-05-14 — End: 2021-07-08
  Filled 2021-05-13: qty 30, 30d supply, fill #0

## 2021-05-13 NOTE — Progress Notes (Signed)
CSW received referral regarding substance use resources. CSW spoke with patient regarding substance use. Patient states that she does use cocaine but does not feel that it is a problem for her. She reported that she is aware of the dangers of using and accepted resources from CSW. CSW explained she would have to locate one that would accept her on oxygen. She also is followed by [facility] ACT team.  ? ?Cristobal Goldmann ?LCSW, MSW, MHA ? ? ? ? ? ? ?

## 2021-05-13 NOTE — TOC Transition Note (Signed)
Transition of Care (TOC) - CM/SW Discharge Note ? ? ?Patient Details  ?Name: Rachel Nolan ?MRN: [id number] ?Date of Birth: June 28, 1970 ? ?Transition of Care (TOC) CM/SW Contact:  ?Harriet Masson, RN ?Phone Number: ?05/13/2021, 11:33 AM ? ? ?Clinical Narrative:    ?Patient stable for discharge. Spoke to patient regarding transition needs. ?Patient currently has home 02 with [employer]. Patient requesting portable tank for transfer home Patient asking about Portable oxygen concentrator. Instructions placed on AVS. Requested CMA to make new PCP apt at [facility]. Patient gets her medication at [facility]. Patient will call her ride to transport her home ? ?Final next level of care: Home/Self Care ?Barriers to Discharge: Barriers Resolved ? ? ?Patient Goals and CMS Choice ?Patient states their goals for this hospitalization and ongoing recovery are:: go home ?  ?  ? ?Discharge Placement ?  ?           ?  ?  ?  ?  ? ?Discharge Plan and Services ?  ?Discharge Planning Services: CM Consult ?Post Acute Care Choice: Durable Medical Equipment          ?DME Arranged: Oxygen ?DME Agency: [facility] ?Date DME Agency Contacted: 05/13/21 ?Time DME Agency Contacted: 1132 ?Representative spoke with at DME Agency: Leavy Cella ?  ?  ?  ?  ?  ? ?Social Determinants of Health (SDOH) Interventions ?  ? ? ?Readmission Risk Interventions ? ?  05/13/2021  ? 11:33 AM 12/04/2019  ? 11:07 AM  ?Readmission Risk Prevention Plan  ?Transportation Screening Complete Complete  ?PCP or Specialist Appt within 5-7 Days  Complete  ?Home Care Screening  Complete  ?Medication Review (RN CM)  Complete  ?Medication Review Oceanographer) Complete   ?PCP or Specialist appointment within 3-5 days of discharge Not Complete   ?PCP/Specialist Appt Not Complete comments apt wll be made for [facility] new pcp apt   ?HRI or Home Care Consult Complete   ?SW Recovery Care/Counseling Consult Complete   ?Palliative Care Screening Not Applicable   ?Skilled Nursing Facility Not Applicable    ? ? ? ? ? ?

## 2021-05-13 NOTE — Discharge Summary (Signed)
?Physician Discharge Summary ?  ?Patient: Rachel Nolan MRN: [id number] DOB: [birth year]  ?Admit date:     05/11/2021  ?Discharge date: 05/13/21  ?Discharge Physician: Corrie Mckusick [place] Gopal  ? ?PCP: Pcp, No  ? ?Recommendations at discharge:  ? ?Follow-up with your primary care provider in 1 week. ? ?Discharge Diagnoses: ?Active Problems: ?  Chronic respiratory failure with hypoxia (HCC) ?  Anxiety and depression ?  Essential hypertension ?  Cocaine abuse ([place]) ?  Tobacco abuse ?  History of substance abuse ([place]) ? ?Principal Problem (Resolved): ?  Asthma exacerbation ? ?Hospital Course: ?Rachel Nolan is a 51 y.o. female with medical history significant of hypertension, asthma, chronic respiratory failure on 2 L, anxiety, depression, and polysubstance abuse (alcohol, cocaine, marijuana, and tobacco) presented hospital with shortness of breath for 1 day with wheezing and chest tightness productive cough.  Patient continues to smoke and had used cocaine 3 days prior to presentation.  She tried nebulizer at home without much improvement.  EMS administered Solu-Medrol 125 mg IV, 2 breathing treatments and magnesium sulfate 2 g.  Patient was then brought into the hospital.  In the ED patient was mildly hypertensive, tachypneic.  On 2 L of nasal cannula at baseline.  Chest x-ray did not show any acute infiltrate.  Influenza and COVID was negative.  Patient was then admitted hospital for further evaluation and treatment.  ?  ?Assessment and Plan: ? ?Active Problems: ?  Chronic respiratory failure with hypoxia (HCC) ?  Anxiety and depression ?  Essential hypertension ?  Cocaine abuse ([place]) ?  Tobacco abuse ?  History of substance abuse ([place]) ?  ?Chronic respiratory failure with hypoxia secondary to asthma exacerbation ?Question for exposure to mold exacerbating her asthma.  Patient however continues to smoke and had used cocaine recently.  Received DuoNebs Solu-Medrol Singulair Mucinex.  Patient has symptomatically felt much  better today and is at her baseline.  We will consider p.o. prednisone taper on discharge.  Patient has requested albuterol nebulizer liquid which will be refilled.  She was extensively counseled regarding quitting smoking and illicit substances ? ?Essential hypertension ?Continue prazosin and amlodipine.  ?  ?Anxiety and depression PTSD ?On prazosin 1 mg nightly, Prozac 40 mg daily, hydroxyzine 50 mg 3 times daily as needed for anxiety, and Seroquel 100 mg daily.  Continue on discharge ? ?Tobacco abuse cocaine abuse alcohol use disorder. ?Still smoking a pack of cigarettes.  Continue nicotine patch ?  ?OSA ?-Continue CPAP nightly ? ?Consultants: None ? ?Procedures performed: None ? ?Disposition: Home ? ?Diet recommendation:  ?Discharge Diet Orders (From admission, onward)  ? ?  Start     Ordered  ? 05/13/21 0000  Diet - low sodium heart healthy       ? 05/13/21 1103  ? ?  ?  ? ?  ? ?Cardiac diet ?DISCHARGE MEDICATION: ?Allergies as of 05/13/2021   ?No Known Allergies ?  ? ?  ?Medication List  ?  ? ?STOP taking these medications   ? ?predniSONE 10 MG (21) Tbpk tablet ?Commonly known as: STERAPRED UNI-PAK 21 TAB ?Replaced by: predniSONE 10 MG tablet ?  ? ?  ? ?TAKE these medications   ? ?albuterol 108 (90 Base) MCG/ACT inhaler ?Commonly known as: VENTOLIN HFA ?Inhale 2 puffs into the lungs every 4 (four) hours as needed for shortness of breath or wheezing. ?  ?albuterol (2.5 MG/3ML) 0.083% nebulizer solution ?Commonly known as: PROVENTIL ?Take 3 mLs (2.5 mg total) by nebulization every 4 (four) hours as  needed for wheezing or shortness of breath. ?  ?amLODipine 5 MG tablet ?Commonly known as: NORVASC ?Take 1 tablet (5 mg total) by mouth daily. ?Start taking on: May 14, 2021 ?  ?FLUoxetine 40 MG capsule ?Commonly known as: PROZAC ?Take 1 capsule (40 mg total) by mouth every morning. ?  ?gabapentin 100 MG capsule ?Commonly known as: NEURONTIN ?Take 2 capsules (200 mg total) by mouth 3 (three) times daily. ?   ?hydrOXYzine 50 MG tablet ?Commonly known as: ATARAX ?Take 1 tablet (50 mg total) by mouth 3 (three) times daily as needed for anxiety. ?  ?ibuprofen 200 MG tablet ?Commonly known as: ADVIL ?Take 400 mg by mouth every 6 (six) hours as needed for headache or moderate pain. ?  ?montelukast 10 MG tablet ?Commonly known as: SINGULAIR ?Take 1 tablet (10 mg total) by mouth at bedtime. ?  ?nicotine 21 mg/24hr patch ?Commonly known as: NICODERM CQ - dosed in mg/24 hours ?Place 1 patch (21 mg total) onto the skin daily. ?  ?NON FORMULARY ?CPAP at bedtime ?  ?OXYGEN ?Inhale 2 L into the lungs as needed (shortness of breath). ?  ?prazosin 1 MG capsule ?Commonly known as: MINIPRESS ?Take 1 capsule (1 mg total) by mouth at bedtime. ?  ?predniSONE 10 MG tablet ?Commonly known as: DELTASONE ?Take 4 tablets (40 mg) daily for 2 days, then, ?Take 3 tablets (30 mg) daily for 2 days, then, ?Take 2 tablets (20 mg) daily for 2 days, then, ?Take 1 tablets (10 mg) daily for 1 days, then stop ?Replaces: predniSONE 10 MG (21) Tbpk tablet ?  ?QUEtiapine 100 MG tablet ?Commonly known as: SEROQUEL ?Take 1 tablet (100 mg total) by mouth at bedtime. ?  ?traZODone 150 MG tablet ?Commonly known as: DESYREL ?Take 1 tablet (150 mg total) by mouth at bedtime as needed for sleep. ?  ? ?  ? ? Follow-up Information   ? ? [facility]   ?Specialty: Urgent Care ?Why: For substance use treatment. ?Contact information: ?[address] ?[place] ?[phone] ? ?  ?  ? ? [facility] Follow up.   ?Why: Call [facility] to schedule apt to be asessed for Portable oxygen concentrator ?Contact information: ?[address] ?[place] [address] ?[phone] ? ? ?  ?  ? ?  ?  ? ?  ? ?Discharge Exam: ?Filed Weights  ? 05/11/21 0612 05/11/21 2203  ?Weight: 83 kg 84.7 kg  ? ?General:  Average built, not in obvious distress ?HENT:   No scleral pallor or icterus noted. Oral mucosa is moist.  ?Chest:   Diminished breath sounds bilaterally.  No overt wheezing noted ?CVS: S1 &S2 heard. No murmur.  Regular rate and rhythm. ?Abdomen: Soft, nontender, nondistended.  Bowel sounds are heard.   ?Extremities: No cyanosis, clubbing or edema.  Peripheral pulses are palpable. ?Psych: Alert, awake and oriented, normal mood ?CNS:  No cranial nerve deficits.  Power equal in all extremities.   ?Skin: Warm and dry.  No rashes noted. ? ?Condition at discharge: good ? ?The results of significant diagnostics from this hospitalization (including imaging, microbiology, ancillary and laboratory) are listed below for reference.  ? ?Imaging Studies: ?DG Chest Port 1 View ? ?Result Date: 05/11/2021 ?CLINICAL DATA:  Shortness of breath. EXAM: PORTABLE CHEST 1 VIEW COMPARISON:  04/20/2021 FINDINGS: 0626 hours. The lungs are clear without focal pneumonia, edema, pneumothorax or pleural effusion. Cardiopericardial silhouette is at upper limits of normal for size. Thoracolumbar  scoliosis noted with associated fusion hardware. Telemetry leads overlie the chest. IMPRESSION: No active disease. Electronically Signed   By: Misty Stanley M.D.   On: 05/11/2021 06:40  ? ?DG Chest Port 1 View ? ?Result Date: 04/20/2021 ?CLINICAL DATA:  Shortness of breath.  Respiratory distress. EXAM: PORTABLE CHEST 1 VIEW COMPARISON:  03/23/2021 FINDINGS: Heart size and pulmonary vascularity are normal. Lungs are clear. No pleural effusions. No pneumothorax. Mediastinal contours appear intact. Thoracolumbar scoliosis with fixation rod. IMPRESSION: No active disease. Electronically Signed   By: Lucienne Capers M.D.   On: 04/20/2021 19:05   ? ?Microbiology: ?Results for orders placed or performed during the hospital encounter of 05/11/21  ?Resp Panel by RT-PCR (Flu A&B, Covid) Nasopharyngeal Swab     Status: None  ? Collection Time: 05/11/21 10:15 AM  ? Specimen: Nasopharyngeal Swab; Nasopharyngeal(NP) swabs in vial transport medium  ?Result Value Ref Range Status  ? SARS  Coronavirus 2 by RT PCR NEGATIVE NEGATIVE Final  ?  Comment: (NOTE) ?SARS-CoV-2 target nucleic acids are NOT DETECTED. ? ?The SARS-CoV-2 RNA is generally detectable in upper respiratory ?specimens during the acute pha

## 2021-05-16 ENCOUNTER — Emergency Department (HOSPITAL_COMMUNITY)
Admission: EM | Admit: 2021-05-16 | Discharge: 2021-05-17 | Discharge status: Against Medical Advice | Payer: Self-pay | Attending: Emergency Medicine | Admitting: Emergency Medicine

## 2021-05-16 ENCOUNTER — Other Ambulatory Visit: Payer: Self-pay

## 2021-05-16 DIAGNOSIS — R062 Wheezing: Secondary | ICD-10-CM | POA: Insufficient documentation

## 2021-05-16 DIAGNOSIS — F1490 Cocaine use, unspecified, uncomplicated: Secondary | ICD-10-CM | POA: Insufficient documentation

## 2021-05-16 DIAGNOSIS — R0602 Shortness of breath: Secondary | ICD-10-CM | POA: Insufficient documentation

## 2021-05-16 DIAGNOSIS — Z5321 Procedure and treatment not carried out due to patient leaving prior to being seen by health care provider: Secondary | ICD-10-CM | POA: Insufficient documentation

## 2021-05-16 NOTE — ED Triage Notes (Signed)
Pt reported to ED with c/o shortness of breath since for approximately 3 hours. Pt able to speak in full sentences but has audible wheezing. Pt endorses smoking marijuana and crack cocaine today.  ?

## 2021-05-17 ENCOUNTER — Emergency Department (HOSPITAL_COMMUNITY): Payer: Self-pay

## 2021-05-17 ENCOUNTER — Other Ambulatory Visit: Payer: Self-pay

## 2021-05-17 ENCOUNTER — Inpatient Hospital Stay (HOSPITAL_COMMUNITY)
Admission: EM | Admit: 2021-05-17 | Discharge: 2021-05-19 | DRG: 190 | Disposition: A | Discharge status: Home / Self Care | Payer: Self-pay | Attending: Internal Medicine | Admitting: Internal Medicine

## 2021-05-17 ENCOUNTER — Encounter (HOSPITAL_COMMUNITY): Payer: Self-pay | Admitting: Emergency Medicine

## 2021-05-17 DIAGNOSIS — J4541 Moderate persistent asthma with (acute) exacerbation: Secondary | ICD-10-CM

## 2021-05-17 DIAGNOSIS — F1410 Cocaine abuse, uncomplicated: Secondary | ICD-10-CM | POA: Diagnosis present

## 2021-05-17 DIAGNOSIS — F1010 Alcohol abuse, uncomplicated: Secondary | ICD-10-CM | POA: Diagnosis present

## 2021-05-17 DIAGNOSIS — F4310 Post-traumatic stress disorder, unspecified: Secondary | ICD-10-CM | POA: Diagnosis present

## 2021-05-17 DIAGNOSIS — J45901 Unspecified asthma with (acute) exacerbation: Secondary | ICD-10-CM | POA: Diagnosis present

## 2021-05-17 DIAGNOSIS — J9621 Acute and chronic respiratory failure with hypoxia: Secondary | ICD-10-CM | POA: Diagnosis present

## 2021-05-17 DIAGNOSIS — F1290 Cannabis use, unspecified, uncomplicated: Secondary | ICD-10-CM | POA: Diagnosis present

## 2021-05-17 DIAGNOSIS — J9611 Chronic respiratory failure with hypoxia: Secondary | ICD-10-CM | POA: Diagnosis present

## 2021-05-17 DIAGNOSIS — J441 Chronic obstructive pulmonary disease with (acute) exacerbation: Principal | ICD-10-CM

## 2021-05-17 DIAGNOSIS — F419 Anxiety disorder, unspecified: Secondary | ICD-10-CM | POA: Diagnosis present

## 2021-05-17 DIAGNOSIS — Z720 Tobacco use: Secondary | ICD-10-CM | POA: Diagnosis present

## 2021-05-17 DIAGNOSIS — Z8249 Family history of ischemic heart disease and other diseases of the circulatory system: Secondary | ICD-10-CM

## 2021-05-17 DIAGNOSIS — M419 Scoliosis, unspecified: Secondary | ICD-10-CM | POA: Diagnosis present

## 2021-05-17 DIAGNOSIS — G4733 Obstructive sleep apnea (adult) (pediatric): Secondary | ICD-10-CM | POA: Diagnosis present

## 2021-05-17 DIAGNOSIS — F1210 Cannabis abuse, uncomplicated: Secondary | ICD-10-CM | POA: Diagnosis present

## 2021-05-17 DIAGNOSIS — F17210 Nicotine dependence, cigarettes, uncomplicated: Secondary | ICD-10-CM | POA: Diagnosis present

## 2021-05-17 DIAGNOSIS — I10 Essential (primary) hypertension: Secondary | ICD-10-CM | POA: Diagnosis present

## 2021-05-17 DIAGNOSIS — Z79899 Other long term (current) drug therapy: Secondary | ICD-10-CM

## 2021-05-17 DIAGNOSIS — F1910 Other psychoactive substance abuse, uncomplicated: Secondary | ICD-10-CM

## 2021-05-17 DIAGNOSIS — F32A Depression, unspecified: Secondary | ICD-10-CM | POA: Diagnosis present

## 2021-05-17 DIAGNOSIS — Z9981 Dependence on supplemental oxygen: Secondary | ICD-10-CM

## 2021-05-17 LAB — BASIC METABOLIC PANEL
Anion gap: 5 (ref 5–15)
BUN: 9 mg/dL (ref 6–20)
CO2: 29 mmol/L (ref 22–32)
Calcium: 9.4 mg/dL (ref 8.9–10.3)
Chloride: 105 mmol/L (ref 98–111)
Creatinine, Ser: 0.6 mg/dL (ref 0.44–1.00)
GFR, Estimated: >60 mL/min (ref >60)
Glucose, Bld: 86 mg/dL (ref 70–99)
Potassium: 4.1 mmol/L (ref 3.5–5.1)
Sodium: 139 mmol/L (ref 135–145)

## 2021-05-17 LAB — CBC
HCT: 40 % (ref 36.0–46.0)
Hemoglobin: 13.4 g/dL (ref 12.0–15.0)
MCH: 31.5 pg (ref 26.0–34.0)
MCHC: 33.5 g/dL (ref 30.0–36.0)
MCV: 94.1 fL (ref 80.0–100.0)
Platelets: 295 10*3/uL (ref 150–400)
RBC: 4.25 MIL/uL (ref 3.87–5.11)
RDW: 14.8 % (ref 11.5–15.5)
WBC: 6.4 10*3/uL (ref 4.0–10.5)
nRBC: 0 % (ref 0.0–0.2)

## 2021-05-17 LAB — HIV ANTIBODY (ROUTINE TESTING W REFLEX): HIV Screen 4th Generation wRfx: NONREACTIVE

## 2021-05-17 LAB — TROPONIN I (HIGH SENSITIVITY)
Troponin I (High Sensitivity): 8 ng/L (ref <18)
Troponin I (High Sensitivity): 5 ng/L (ref <18)

## 2021-05-17 LAB — I-STAT BETA HCG BLOOD, ED (MC, WL, AP ONLY): I-stat hCG, quantitative: 9.7 m[IU]/mL — ABNORMAL HIGH (ref <5)

## 2021-05-17 MED ORDER — HYDRALAZINE HCL 20 MG/ML IJ SOLN: 10.0000 mg | INTRAMUSCULAR | Status: DC | PRN

## 2021-05-17 MED ORDER — NICOTINE 21 MG/24HR TD PT24
21.0000 mg | MEDICATED_PATCH | Freq: Every day | TRANSDERMAL | Status: DC
  Filled 2021-05-17 (×2): qty 1

## 2021-05-17 MED ORDER — GABAPENTIN 100 MG PO CAPS
200.0000 mg | ORAL_CAPSULE | Freq: Three times a day (TID) | ORAL | Status: DC
  Administered 2021-05-17 – 2021-05-19 (×6): 200 mg via ORAL
  Filled 2021-05-17 (×6): qty 2

## 2021-05-17 MED ORDER — ACETAMINOPHEN 325 MG PO TABS: 650.0000 mg | ORAL_TABLET | Freq: Four times a day (QID) | ORAL | Status: DC | PRN

## 2021-05-17 MED ORDER — ACETAMINOPHEN 650 MG RE SUPP: 650.0000 mg | Freq: Four times a day (QID) | RECTAL | Status: DC | PRN

## 2021-05-17 MED ORDER — ONDANSETRON HCL 4 MG/2ML IJ SOLN
4.0000 mg | Freq: Four times a day (QID) | INTRAMUSCULAR | Status: DC | PRN
  Administered 2021-05-17: 4 mg via INTRAVENOUS
  Filled 2021-05-17: qty 2

## 2021-05-17 MED ORDER — QUETIAPINE FUMARATE 100 MG PO TABS
100.0000 mg | ORAL_TABLET | Freq: Every day | ORAL | Status: DC
  Administered 2021-05-17 – 2021-05-18 (×2): 100 mg via ORAL
  Filled 2021-05-17 (×2): qty 1

## 2021-05-17 MED ORDER — IPRATROPIUM-ALBUTEROL 0.5-2.5 (3) MG/3ML IN SOLN
3.0000 mL | Freq: Four times a day (QID) | RESPIRATORY_TRACT | Status: DC
Start: 2021-05-17 — End: 2021-05-19
  Administered 2021-05-17 – 2021-05-19 (×8): 3 mL via RESPIRATORY_TRACT
  Filled 2021-05-17 (×10): qty 3

## 2021-05-17 MED ORDER — ENOXAPARIN SODIUM 40 MG/0.4ML IJ SOSY
40.0000 mg | PREFILLED_SYRINGE | Freq: Every day | INTRAMUSCULAR | Status: DC
  Administered 2021-05-17 – 2021-05-19 (×3): 40 mg via SUBCUTANEOUS
  Filled 2021-05-17 (×3): qty 0.4

## 2021-05-17 MED ORDER — SODIUM CHLORIDE 0.9 % IV SOLN: INTRAVENOUS | Status: DC

## 2021-05-17 MED ORDER — GUAIFENESIN ER 600 MG PO TB12
600.0000 mg | ORAL_TABLET | Freq: Two times a day (BID) | ORAL | Status: DC
  Administered 2021-05-17 – 2021-05-19 (×5): 600 mg via ORAL
  Filled 2021-05-17 (×5): qty 1

## 2021-05-17 MED ORDER — ONDANSETRON HCL 4 MG PO TABS
4.0000 mg | ORAL_TABLET | Freq: Four times a day (QID) | ORAL | Status: DC | PRN
Start: 2021-05-17 — End: 2021-05-19

## 2021-05-17 MED ORDER — PREDNISONE 20 MG PO TABS: 40.0000 mg | ORAL_TABLET | Freq: Every day | ORAL | Status: DC

## 2021-05-17 MED ORDER — AMLODIPINE BESYLATE 5 MG PO TABS
5.0000 mg | ORAL_TABLET | Freq: Every day | ORAL | Status: DC
  Administered 2021-05-17 – 2021-05-18 (×2): 5 mg via ORAL
  Filled 2021-05-17 (×2): qty 1

## 2021-05-17 MED ORDER — LORAZEPAM 2 MG/ML IJ SOLN: 0.5000 mg | Freq: Four times a day (QID) | INTRAMUSCULAR | Status: DC | PRN

## 2021-05-17 MED ORDER — FLUOXETINE HCL 20 MG PO CAPS
40.0000 mg | ORAL_CAPSULE | Freq: Every morning | ORAL | Status: DC
  Administered 2021-05-17 – 2021-05-19 (×3): 40 mg via ORAL
  Filled 2021-05-17 (×3): qty 2

## 2021-05-17 MED ORDER — TRAZODONE HCL 50 MG PO TABS
150.0000 mg | ORAL_TABLET | Freq: Every evening | ORAL | Status: DC | PRN
  Administered 2021-05-18: 150 mg via ORAL
  Filled 2021-05-17: qty 3

## 2021-05-17 MED ORDER — IPRATROPIUM-ALBUTEROL 0.5-2.5 (3) MG/3ML IN SOLN
3.0000 mL | Freq: Once | RESPIRATORY_TRACT | Status: AC
  Administered 2021-05-17: 3 mL via RESPIRATORY_TRACT
  Filled 2021-05-17: qty 3

## 2021-05-17 MED ORDER — ALBUTEROL SULFATE (2.5 MG/3ML) 0.083% IN NEBU
10.0000 mg/h | INHALATION_SOLUTION | Freq: Once | RESPIRATORY_TRACT | Status: AC
Start: 2021-05-17 — End: 2021-05-17
  Administered 2021-05-17: 10 mg/h via RESPIRATORY_TRACT
  Filled 2021-05-17: qty 12

## 2021-05-17 MED ORDER — SODIUM CHLORIDE 0.9% FLUSH
3.0000 mL | Freq: Two times a day (BID) | INTRAVENOUS | Status: DC
  Administered 2021-05-17 – 2021-05-19 (×4): 3 mL via INTRAVENOUS

## 2021-05-17 MED ORDER — MONTELUKAST SODIUM 10 MG PO TABS
10.0000 mg | ORAL_TABLET | Freq: Every day | ORAL | Status: DC
  Administered 2021-05-17 – 2021-05-18 (×2): 10 mg via ORAL
  Filled 2021-05-17 (×2): qty 1

## 2021-05-17 MED ORDER — ALBUTEROL SULFATE (2.5 MG/3ML) 0.083% IN NEBU
2.5000 mg | INHALATION_SOLUTION | RESPIRATORY_TRACT | Status: DC | PRN
  Administered 2021-05-17: 2.5 mg via RESPIRATORY_TRACT
  Filled 2021-05-17: qty 3

## 2021-05-17 MED ORDER — PRAZOSIN HCL 1 MG PO CAPS
1.0000 mg | ORAL_CAPSULE | Freq: Every day | ORAL | Status: DC
  Administered 2021-05-17 – 2021-05-18 (×2): 1 mg via ORAL
  Filled 2021-05-17 (×3): qty 1

## 2021-05-17 MED ORDER — ORAL CARE MOUTH RINSE
15.0000 mL | Freq: Two times a day (BID) | OROMUCOSAL | Status: DC
  Administered 2021-05-17 – 2021-05-19 (×4): 15 mL via OROMUCOSAL

## 2021-05-17 NOTE — ED Notes (Signed)
RT called about continuous neb treatment.  ?

## 2021-05-17 NOTE — ED Notes (Signed)
Medications due have not been verified. ?

## 2021-05-17 NOTE — ED Provider Notes (Signed)
Pt signed out by Dr. Bernette Mayers at shift change.  Pt has now finished her continuous neb and is still very sob.  She is wheezing and still in mild distress.  ? ?CXR: ? ?  ?IMPRESSION:  ?Hyperexpansion without acute cardiopulmonary findings.  ? ?Films reviewed by me.  I agree with the radiologist's findings. ? ?Pt's labs from earlier today.  They were unremarkable.   ? ?Pt d/w Dr. Katrinka Blazing (triad) for admission. ?  ?Jacalyn Lefevre, MD ?05/17/21 [phone] ? ?

## 2021-05-17 NOTE — ED Triage Notes (Signed)
Patient BIB [facility] w/ c/o shob since this past evening.  Patient seen earlier here for same.  Patient able to speak in complete sentences but has audible wheezing.   ? ?EMS Treatments ?2 g of mag sulfate ?1 duoneb ?125 mg solumedrol ?99% 2 L [place] (chronic) ?

## 2021-05-17 NOTE — ED Notes (Signed)
Patient to xray.

## 2021-05-17 NOTE — ED Notes (Signed)
Called for room x4 

## 2021-05-17 NOTE — ED Notes (Signed)
Patient is resting comfortably. 

## 2021-05-17 NOTE — ED Provider Notes (Signed)
? ?[facility] EMERGENCY DEPARTMENT  ?Provider Note ? ?CSN: [id number] ?Arrival date & time: 05/17/21 0605 ? ?History ?Chief Complaint  ?Patient presents with  ? Shortness of Breath  ? ? ?Rachel Nolan is a 51 y.o. female with history of asthma/COPD, chronic respiratory failure on 2L [place] at home, continues to smoke tobacco, marijuana and cocaine on a daily basis. Was admitted to the hospital for same earlier this week, discharged on 4/26, returned to the ED last night for return of wheezing, SOB and chest tightness but left prior to evaluation. She had labs done before she left which were normal including CBC, BMP and Trop. She did not have a CXR. She was worse this morning and so she called EMS. She was given solumedrol, magnesium and Duoneb x 2 enroute with minimal improvement.  ? ? ?Home Medications ?Prior to Admission medications   ?Medication Sig Start Date End Date Taking? Authorizing Provider  ?albuterol (PROVENTIL) (2.5 MG/3ML) 0.083% nebulizer solution Take 3 mLs (2.5 mg total) by nebulization every 4 (four) hours as needed for wheezing or shortness of breath. 05/13/21 05/13/22  Pokhrel, Rebekah Chesterfield, MD  ?albuterol (VENTOLIN HFA) 108 (90 Base) MCG/ACT inhaler Inhale 2 puffs into the lungs every 4 (four) hours as needed for shortness of breath or wheezing. 04/08/21   Clapacs, Jackquline Denmark, MD  ?amLODipine (NORVASC) 5 MG tablet Take 1 tablet (5 mg total) by mouth daily. 05/14/21 05/14/22  Pokhrel, Rebekah Chesterfield, MD  ?FLUoxetine (PROZAC) 40 MG capsule Take 1 capsule (40 mg total) by mouth every morning. 04/08/21   Clapacs, Jackquline Denmark, MD  ?gabapentin (NEURONTIN) 100 MG capsule Take 2 capsules (200 mg total) by mouth 3 (three) times daily. 04/08/21   Clapacs, Jackquline Denmark, MD  ?hydrOXYzine (ATARAX) 50 MG tablet Take 1 tablet (50 mg total) by mouth 3 (three) times daily as needed for anxiety. 04/08/21   Clapacs, Jackquline Denmark, MD  ?ibuprofen (ADVIL) 200 MG tablet Take 400 mg by mouth every 6 (six) hours as needed for headache or moderate  pain.    [provider]  ?montelukast (SINGULAIR) 10 MG tablet Take 1 tablet (10 mg total) by mouth at bedtime. 04/08/21   Clapacs, Jackquline Denmark, MD  ?nicotine (NICODERM CQ - DOSED IN MG/24 HOURS) 21 mg/24hr patch Place 1 patch (21 mg total) onto the skin daily. 04/08/21   Clapacs, Jackquline Denmark, MD  ?NON FORMULARY CPAP at bedtime    [provider]  ?OXYGEN Inhale 2 L into the lungs as needed (shortness of breath).    [provider]  ?prazosin (MINIPRESS) 1 MG capsule Take 1 capsule (1 mg total) by mouth at bedtime. 04/08/21   Clapacs, Jackquline Denmark, MD  ?predniSONE (DELTASONE) 10 MG tablet Take 4 tablets (40 mg) daily for 2 days, then, ?Take 3 tablets (30 mg) daily for 2 days, then, ?Take 2 tablets (20 mg) daily for 2 days, then, ?Take 1 tablets (10 mg) daily for 1 days, then stop 05/13/21   Joycelyn Das, MD  ?QUEtiapine (SEROQUEL) 100 MG tablet Take 1 tablet (100 mg total) by mouth at bedtime. 04/08/21   Clapacs, Jackquline Denmark, MD  ?traZODone (DESYREL) 150 MG tablet Take 1 tablet (150 mg total) by mouth at bedtime as needed for sleep. 04/08/21   Clapacs, Jackquline Denmark, MD  ?citalopram (CELEXA) 20 MG tablet Take 1 tablet (20 mg total) by mouth daily. 03/07/19 07/23/19  Aldean Baker, NP  ?lisinopril (ZESTRIL) 10 MG tablet Take 1 tablet (10 mg total) by  mouth daily. 03/07/19 09/11/19  Aldean Baker, NP  ?pantoprazole (PROTONIX) 40 MG tablet Take 1 tablet (40 mg total) by mouth daily. 03/07/19 07/23/19  Aldean Baker, NP  ? ? ? ?Allergies    ?Patient has no known allergies. ? ? ?Review of Systems   ?Review of Systems ?Please see HPI for pertinent positives and negatives ? ?Physical Exam ?BP (!) 155/107 (BP Location: Right Arm)   Pulse 87   Temp 98 ?F (36.7 ?C) (Oral)   Resp (!) 21   LMP 06/19/2018 (Exact Date)   SpO2 100%  ? ?Physical Exam ?Vitals and nursing note reviewed.  ?Constitutional:   ?   Appearance: Normal appearance.  ?HENT:  ?   Head: Normocephalic and atraumatic.  ?   Nose: Nose normal.  ?   Mouth/Throat:  ?    Mouth: Mucous membranes are moist.  ?Eyes:  ?   Extraocular Movements: Extraocular movements intact.  ?   Conjunctiva/sclera: Conjunctivae normal.  ?Cardiovascular:  ?   Rate and Rhythm: Normal rate.  ?Pulmonary:  ?   Effort: Tachypnea and accessory muscle usage present.  ?   Breath sounds: Wheezing present.  ?Abdominal:  ?   General: Abdomen is flat.  ?   Palpations: Abdomen is soft.  ?   Tenderness: There is no abdominal tenderness.  ?Musculoskeletal:     ?   General: No swelling. Normal range of motion.  ?   Cervical back: Neck supple.  ?Skin: ?   General: Skin is warm and dry.  ?Neurological:  ?   General: No focal deficit present.  ?   Mental Status: She is alert.  ?Psychiatric:     ?   Mood and Affect: Mood normal.  ? ? ?ED Results / Procedures / Treatments   ?EKG ?None ? ?Procedures ?Procedures ? ?Medications Ordered in the ED ?Medications  ?albuterol (PROVENTIL) (2.5 MG/3ML) 0.083% nebulizer solution (10 mg/hr Nebulization Given 05/17/21 0634)  ? ? ?Initial Impression and Plan ? Patient here with exacerbation of COPD. Not improved with meds given by EMS, will start a continuous neb. Check CXR. Anticipate she will need readmission.  ? ?ED Course  ? ?Clinical Course as of 05/17/21 0712  ?Sun May 17, 2021  ?0710 Care of the patient will be signed out to Dr. Particia Nearing at the change of shift pending CXR and reassessment of respiratory status. Anticipate she will need readmission.  [CS]  ?  ?Clinical Course User Index ?[CS] Pollyann Savoy, MD  ? ? ? ?MDM Rules/Calculators/A&P ?Medical Decision Making ?Problems Addressed: ?COPD exacerbation (HCC): chronic illness or injury with exacerbation, progression, or side effects of treatment ?Polysubstance abuse [employer]): chronic illness or injury ? ?Amount and/or Complexity of Data Reviewed ?Labs:  Decision-making details documented in ED Course. ?Radiology: ordered. ? ?Risk ?Prescription drug management. ? ?Critical Care ?Total time providing critical care: 35  minutes ? ? ?Final Clinical Impression(s) / ED Diagnoses ?Final diagnoses:  ?COPD exacerbation (HCC)  ?Polysubstance abuse (HCC)  ? ? ?Rx / DC Orders ?ED Discharge Orders   ? ? None  ? ?  ? ?  ?Pollyann Savoy, MD ?05/17/21 [phone] ? ?

## 2021-05-17 NOTE — H&P (Signed)
?History and Physical  ? ? ?Patient: Rachel Nolan YSA:[id number] DOB: 10/31/1970 ?DOA: 05/17/2021 ?DOS: the patient was seen and examined on 05/17/2021 ?PCP: Pcp, No  ?Patient coming from: Home via EMS ? ?Chief Complaint:  ?Chief Complaint  ?Patient presents with  ? Shortness of Breath  ? ?HPI: Rachel Nolan is a 51 y.o. female with medical history significant of hypertension, asthma/COPD, chronic respiratory failure on 2 L, anxiety, depression, and polysubstance abuse (including cocaine, marijuana, tobacco, and remote history alcohol abuse) presented hospital with shortness of breath.  Patient had just recently hospitalized 4/24-4/26 asthma exacerbation likely related to continue cocaine, marijuana, and tobacco abuse.  Patient was discharged home with a prednisone taper.  She reported to be taking her medications as prescribed.  Rachel Nolan had come to the emergency department yesterday due to shortness of breath, but left prior to being fully evaluated.  She attributes her symptoms to mold that is in her sister's house where she is staying.  However, patient admits that she still using cocaine and smoking cigarettes on a regular basis.  Notes that she tried using home inhalers and breathing treatments without improvement in symptoms.  Associated symptoms include wheezing, intermittent cough, substernal chest discomfort, chest tightness, and muscle cramping. ? ?In route with EMS patient had been given DuoNeb breathing treatment, magnesium sulfate 2 g IV, Solu-Medrol 125 mg IV, and O2 saturation were maintained on home 2 L of oxygen. ? ?On admission into the emergency department patient was seen to be afebrile with pulse 87-102, respirations 20-31, blood pressures 135/96 to 174/96, and O2 saturation currently maintained on home 2 L of nasal cannula oxygen.  Labs including CBC, BMP, and high-sensitivity troponin within normal limits.  Chest x-ray noted hyperexpansion without acute cardiopulmonary finding.  In  the ED patient had been given a continuous albuterol treatment, but due to work of breathing was thought to need admission. ? ?Review of Systems: As mentioned in the history of present illness. All other systems reviewed and are negative. ?Past Medical History:  ?Diagnosis Date  ? Anxiety   ? Asthma   ? COPD (chronic obstructive pulmonary disease) (HCC)   ? Depression   ? Hypertension   ? Scoliosis   ? ?Past Surgical History:  ?Procedure Laterality Date  ? BACK SURGERY    ? ECTOPIC PREGNANCY SURGERY    ? ?Social History:  reports that she has been smoking cigarettes. She has been smoking an average of .5 packs per day. She has never used smokeless tobacco. She reports current alcohol use. She reports current drug use. Drugs: Marijuana, Cocaine, and "Crack" cocaine. ? ?No Known Allergies ? ?Family History  ?Problem Relation Age of Onset  ? Hypertension Mother   ? Hypertension Father   ? ? ?Prior to Admission medications   ?Medication Sig Start Date End Date Taking? Authorizing Provider  ?albuterol (PROVENTIL) (2.5 MG/3ML) 0.083% nebulizer solution Take 3 mLs (2.5 mg total) by nebulization every 4 (four) hours as needed for wheezing or shortness of breath. 05/13/21 05/13/22  Pokhrel, Rebekah Chesterfield, MD  ?albuterol (VENTOLIN HFA) 108 (90 Base) MCG/ACT inhaler Inhale 2 puffs into the lungs every 4 (four) hours as needed for shortness of breath or wheezing. 04/08/21   Clapacs, Jackquline Denmark, MD  ?amLODipine (NORVASC) 5 MG tablet Take 1 tablet (5 mg total) by mouth daily. 05/14/21 05/14/22  Pokhrel, Rebekah Chesterfield, MD  ?FLUoxetine (PROZAC) 40 MG capsule Take 1 capsule (40 mg total) by mouth every morning. 04/08/21   Clapacs, Jackquline Denmark, MD  ?  gabapentin (NEURONTIN) 100 MG capsule Take 2 capsules (200 mg total) by mouth 3 (three) times daily. 04/08/21   Clapacs, Jackquline Denmark, MD  ?hydrOXYzine (ATARAX) 50 MG tablet Take 1 tablet (50 mg total) by mouth 3 (three) times daily as needed for anxiety. 04/08/21   Clapacs, Jackquline Denmark, MD  ?ibuprofen (ADVIL) 200 MG tablet Take  400 mg by mouth every 6 (six) hours as needed for headache or moderate pain.    [provider]  ?montelukast (SINGULAIR) 10 MG tablet Take 1 tablet (10 mg total) by mouth at bedtime. 04/08/21   Clapacs, Jackquline Denmark, MD  ?nicotine (NICODERM CQ - DOSED IN MG/24 HOURS) 21 mg/24hr patch Place 1 patch (21 mg total) onto the skin daily. 04/08/21   Clapacs, Jackquline Denmark, MD  ?NON FORMULARY CPAP at bedtime    [provider]  ?OXYGEN Inhale 2 L into the lungs as needed (shortness of breath).    [provider]  ?prazosin (MINIPRESS) 1 MG capsule Take 1 capsule (1 mg total) by mouth at bedtime. 04/08/21   Clapacs, Jackquline Denmark, MD  ?predniSONE (DELTASONE) 10 MG tablet Take 4 tablets (40 mg) daily for 2 days, then, ?Take 3 tablets (30 mg) daily for 2 days, then, ?Take 2 tablets (20 mg) daily for 2 days, then, ?Take 1 tablets (10 mg) daily for 1 days, then stop 05/13/21   Joycelyn Das, MD  ?QUEtiapine (SEROQUEL) 100 MG tablet Take 1 tablet (100 mg total) by mouth at bedtime. 04/08/21   Clapacs, Jackquline Denmark, MD  ?traZODone (DESYREL) 150 MG tablet Take 1 tablet (150 mg total) by mouth at bedtime as needed for sleep. 04/08/21   Clapacs, Jackquline Denmark, MD  ?citalopram (CELEXA) 20 MG tablet Take 1 tablet (20 mg total) by mouth daily. 03/07/19 07/23/19  Aldean Baker, NP  ?lisinopril (ZESTRIL) 10 MG tablet Take 1 tablet (10 mg total) by mouth daily. 03/07/19 09/11/19  Aldean Baker, NP  ?pantoprazole (PROTONIX) 40 MG tablet Take 1 tablet (40 mg total) by mouth daily. 03/07/19 07/23/19  Aldean Baker, NP  ? ? ?Physical Exam: ?Vitals:  ? 05/17/21 0607 05/17/21 0635 05/17/21 0715 05/17/21 0730  ?BP: (!) 155/107  (!) 135/96 (!) 141/90  ?Pulse: 87  (!) 102 100  ?Resp: (!) 21  (!) 31 20  ?Temp: 98 ?F (36.7 ?C)     ?TempSrc: Oral     ?SpO2: 100% 100% 100% 100%  ? ? ?Constitutional: Middle-aged female who is in mild respiratory distress ?Eyes: PERRL, lids and conjunctivae normal ?ENMT: Mucous membranes are moist.   ?Neck: normal, supple   ?Respiratory: Diffuse expiratory wheezes with decreased overall aeration appreciated in both lung fields.  O2 saturations currently maintained on room air.  Patient talking in mildly shortened sentences. ?Cardiovascular: Tachycardic. No extremity edema.  ?Abdomen: no tenderness, no masses palpated.   Bowel sounds positive.  ?Musculoskeletal:  No joint deformity upper and lower extremities. Good ROM, no contractures. Normal muscle tone.  ?Skin: no rashes, lesions, ulcers. No induration ?Neurologic: CN 2-12 grossly intact.  Able to move all extremities  ?Psychiatric: Poor judgment and insight. Alert and oriented x 3. Normal mood.  ? ?Data Reviewed: ? ?EKG revealed normal sinus rhythm at 98 bpm. ? ? ?Assessment and Plan: ?Chronic respiratory failure with hypoxia with acute on chronic asthma exacerbation secondary to polysubstance abuse ?Patient presents with complaints of worsening shortness of breath, wheezing, cough, and chest tightness.  Just recently hospitalized 4/24-4/26 for the same.  Reported taking  medications and inhalers at home without improvement.  Patient feels symptoms secondary to mold in her sister's house.  Noted to continue to use cocaine, marijuana, and tobacco.  Patient having given Solu-Medrol 125 mg IV, magnesium sulfate 2 g IV, and breathing treatments with continued wheezing appreciated. ?-Admit to a telemetry bed ?-Continuous pulse oximetry with nasal cannula oxygen to maintain O2 saturation greater than 92% ?-Peak flow monitoring ?-DuoNebs 4 times daily and albuterol nebs as needed ?-Prednisone 40 mg  ?-Continue Singulair ?-Mucinex ? ?Essential hypertension ?Blood pressures initially elevated up to 174/96.  Home medication regimen included amlodipine 5 mg daily at last discharge.  Likely multifactorial in setting of recent cocaine use. ?-Continue amlodipine ?-Hydralazine IV as needed ? ?Anxiety and depression PTSD ?Home medication regimen includes prazosin 1 mg nightly, Prozac 40 mg daily,  hydroxyzine 50 mg 3 times daily as needed for anxiety, and Seroquel 100 mg daily ?-Continue home medication regimen ?  ?Tobacco abuse cocaine abuse marijuana user ?Patient admits to continuing to use cocaine, tobacc

## 2021-05-18 ENCOUNTER — Ambulatory Visit: Payer: Self-pay | Admitting: *Deleted

## 2021-05-18 ENCOUNTER — Observation Stay (HOSPITAL_COMMUNITY): Payer: Self-pay

## 2021-05-18 DIAGNOSIS — F1910 Other psychoactive substance abuse, uncomplicated: Secondary | ICD-10-CM

## 2021-05-18 DIAGNOSIS — J441 Chronic obstructive pulmonary disease with (acute) exacerbation: Secondary | ICD-10-CM

## 2021-05-18 LAB — BASIC METABOLIC PANEL
Anion gap: 7 (ref 5–15)
BUN: 19 mg/dL (ref 6–20)
CO2: 28 mmol/L (ref 22–32)
Calcium: 9.1 mg/dL (ref 8.9–10.3)
Chloride: 105 mmol/L (ref 98–111)
Creatinine, Ser: 0.7 mg/dL (ref 0.44–1.00)
GFR, Estimated: >60 mL/min (ref >60)
Glucose, Bld: 96 mg/dL (ref 70–99)
Potassium: 3.6 mmol/L (ref 3.5–5.1)
Sodium: 140 mmol/L (ref 135–145)

## 2021-05-18 LAB — HCG, QUANTITATIVE, PREGNANCY: hCG, Beta Chain, Quant, S: 4 m[IU]/mL (ref <5)

## 2021-05-18 LAB — BRAIN NATRIURETIC PEPTIDE: B Natriuretic Peptide: 13.7 pg/mL (ref 0.0–100.0)

## 2021-05-18 LAB — C-REACTIVE PROTEIN: CRP: 0.7 mg/dL (ref <1.0)

## 2021-05-18 LAB — CBC
HCT: 34.3 % — ABNORMAL LOW (ref 36.0–46.0)
Hemoglobin: 11.6 g/dL — ABNORMAL LOW (ref 12.0–15.0)
MCH: 31.5 pg (ref 26.0–34.0)
MCHC: 33.8 g/dL (ref 30.0–36.0)
MCV: 93.2 fL (ref 80.0–100.0)
Platelets: 237 10*3/uL (ref 150–400)
RBC: 3.68 MIL/uL — ABNORMAL LOW (ref 3.87–5.11)
RDW: 14.6 % (ref 11.5–15.5)
WBC: 8.2 10*3/uL (ref 4.0–10.5)
nRBC: 0 % (ref 0.0–0.2)

## 2021-05-18 LAB — RAPID URINE DRUG SCREEN, HOSP PERFORMED
Amphetamines: NOT DETECTED
Barbiturates: NOT DETECTED
Benzodiazepines: NOT DETECTED
Cocaine: POSITIVE — AB
Opiates: POSITIVE — AB
Tetrahydrocannabinol: NOT DETECTED

## 2021-05-18 LAB — PROCALCITONIN: Procalcitonin: <0.1 ng/mL

## 2021-05-18 MED ORDER — METHYLPREDNISOLONE SODIUM SUCC 125 MG IJ SOLR
60.0000 mg | Freq: Two times a day (BID) | INTRAMUSCULAR | Status: DC
  Administered 2021-05-18 – 2021-05-19 (×3): 60 mg via INTRAVENOUS
  Filled 2021-05-18 (×3): qty 2

## 2021-05-18 MED ORDER — TRAMADOL HCL 50 MG PO TABS: 50.0000 mg | ORAL_TABLET | Freq: Four times a day (QID) | ORAL | Status: DC | PRN

## 2021-05-18 MED ORDER — GUAIFENESIN-CODEINE 100-10 MG/5ML PO SOLN
10.0000 mL | Freq: Four times a day (QID) | ORAL | Status: DC | PRN
  Administered 2021-05-19: 10 mL via ORAL
  Filled 2021-05-18: qty 10

## 2021-05-18 MED ORDER — HYDROCOD POLI-CHLORPHE POLI ER 10-8 MG/5ML PO SUER
5.0000 mL | Freq: Two times a day (BID) | ORAL | Status: DC
  Administered 2021-05-18 – 2021-05-19 (×3): 5 mL via ORAL
  Filled 2021-05-18 (×3): qty 5

## 2021-05-18 MED ORDER — AZITHROMYCIN 500 MG PO TABS
500.0000 mg | ORAL_TABLET | Freq: Every day | ORAL | Status: DC
Start: 2021-05-18 — End: 2021-05-19
  Administered 2021-05-18 – 2021-05-19 (×2): 500 mg via ORAL
  Filled 2021-05-18 (×2): qty 1

## 2021-05-18 MED ORDER — DILTIAZEM HCL 60 MG PO TABS: 60.0000 mg | ORAL_TABLET | Freq: Two times a day (BID) | ORAL | Status: DC

## 2021-05-18 MED ORDER — DILTIAZEM HCL ER 60 MG PO CP12
60.0000 mg | ORAL_CAPSULE | Freq: Two times a day (BID) | ORAL | Status: DC
  Administered 2021-05-18 (×2): 60 mg via ORAL
  Filled 2021-05-18 (×3): qty 1

## 2021-05-18 NOTE — Telephone Encounter (Signed)
Summary: nurse at hospital calling pt admitted/cancel HFU /N pt tomorrow/request fu to nurse appt imperative  ? Pt had a HFU tomorrow as new pt with Dr Alvis Lemmings, Hospital is calling as pt has been readmitted COPD.  Must cancel will not be released by then. Soonest fu with Rachel Nolan on 5/24 but as realized Rachel Nolan only does established care. Pt need HFU sooner than 5/24. Nurse Rachel Nolan) asked me to leave 24th just as precautionary and not cancel if could get exception. They need a sooner appt and pt as been admitted a couple times within month. Rachel Nolan is asking for a FU with nurse to see if something can be worked out. Rachel Nolan  (her nurse) said to cb her at [phone] as pt needs to be under care.   ?  ?Call to Rachel Nolan- patient will probably be inpatient all this week- Rachel [phone]. Plan is patient to be released WED/THUR- they would like to have hospital follow up this Friday or next Monday.  ?Advised I would contact office to see if this can be scheduled with patient still inpatient. Please contact eitherVerlee Nolan [phone] or Rachel Nolan [phone] ?Reason for Disposition ? Requesting regular office appointment ? ?Answer Assessment - Initial Assessment Questions ?1. REASON FOR CALL or QUESTION: "What is your reason for calling today?" or "How can I best help you?" or "What question do you have that I can help answer?" ?    Hospital is calling to schedule hospital follow up for patient still inpatient. Message sent to office for review of request ? ?Protocols used: Information Only Call - No Triage-A-AH ? ?

## 2021-05-18 NOTE — Evaluation (Addendum)
Occupational Therapy Evaluation Patient Details Name: Rachel Nolan MRN: [id number] DOB: 1970-07-14 Today's Date: 05/18/2021   History of Present Illness 51 y.o. female with medical history significant of hypertension, asthma/COPD, chronic respiratory failure on 2 L, anxiety, depression, and polysubstance abuse (including cocaine, marijuana, tobacco, and remote history alcohol abuse) presented hospital with shortness of breath. acute on chronic asthma exacerbation secondary to polysubstance abuse   Clinical Impression   Pt currently min guard to min assist overall for transfers with use of the RW for support and for selfcare tasks sit to stand.  Oxygen sats maintained at 94% or better on 3Ls nasal cannula with activity and 99% when at rest.  Recommend short term acute care OT to continue progression back to PRN supervision/modified independent level for return home.  She has many family members that can assist post discharge as well.       Recommendations for follow up therapy are one component of a multi-disciplinary discharge planning process, led by the attending physician.  Recommendations may be updated based on patient status, additional functional criteria and insurance authorization.   Follow Up Recommendations  No OT follow up    Assistance Recommended at Discharge PRN  Patient can return home with the following A little help with walking and/or transfers;A little help with bathing/dressing/bathroom;Assistance with cooking/housework;Assist for transportation;Direct supervision/assist for medications management    Functional Status Assessment  Patient has had a recent decline in their functional status and demonstrates the ability to make significant improvements in function in a reasonable and predictable amount of time.  Equipment Recommendations  BSC/3in1       Precautions / Restrictions Precautions Precautions: Fall Restrictions Weight Bearing Restrictions: No       Mobility Bed Mobility Overal bed mobility: Needs Assistance Bed Mobility: Supine to Sit     Supine to sit: Supervision          Transfers Overall transfer level: Needs assistance Equipment used: Rolling walker (2 wheels) Transfers: Sit to/from Stand, Bed to chair/wheelchair/BSC Sit to Stand: Min guard, Min assist (min guard for bed and min assist from regular toilet)     Step pivot transfers: Min guard     General transfer comment: Min instructional cueing for placement with sit to stand from the EOB as she wanted to pull up on the RW.      Balance Overall balance assessment: Needs assistance Sitting-balance support: No upper extremity supported, Feet supported Sitting balance-Leahy Scale: Good     Standing balance support: During functional activity, Reliant on assistive device for balance Standing balance-Leahy Scale: Poor                             ADL either performed or assessed with clinical judgement   ADL Overall ADL's : Needs assistance/impaired Eating/Feeding: Set up;Sitting   Grooming: Wash/dry face;Wash/dry hands;Sitting Grooming Details (indicate cue type and reason): setup simulated Upper Body Bathing: Set up;Sitting Upper Body Bathing Details (indicate cue type and reason): simulated Lower Body Bathing: Sit to/from stand;Min guard   Upper Body Dressing : Supervision/safety;Sitting Upper Body Dressing Details (indicate cue type and reason): simulated Lower Body Dressing: Sit to/from stand;Min guard   Toilet Transfer: Regular Toilet;Minimal assistance   Toileting- Clothing Manipulation and Hygiene: Minimal assistance;Sit to/from stand       Functional mobility during ADLs: Rolling walker (2 wheels) (min guard) General ADL Comments: Pt asleep to start session and would only wake up briefly, then fall back  to sleep.  Once therapist turned on the lights and had her sit up, she maintained her attention. She reports not having a higher  toilet at home and sometimes having to get one of her family to help her up.  Feel she will benefit from a 3:1 for home.     Vision Baseline Vision/History: 4 Cataracts (Pt reports cataract in right eye affecting vision) Ability to See in Adequate Light: 1 Impaired Patient Visual Report: No change from baseline Vision Assessment?: No apparent visual deficits     Perception Perception Perception: Within Functional Limits   Praxis Praxis Praxis: Intact    Pertinent Vitals/Pain Pain Assessment Pain Assessment: No/denies pain     Hand Dominance Right   Extremity/Trunk Assessment Upper Extremity Assessment Upper Extremity Assessment: Overall WFL for tasks assessed   Lower Extremity Assessment Lower Extremity Assessment: Defer to PT evaluation   Cervical / Trunk Assessment Cervical / Trunk Assessment: Normal   Communication Communication Communication: No difficulties   Cognition Arousal/Alertness: Lethargic Behavior During Therapy: WFL for tasks assessed/performed Overall Cognitive Status: Within Functional Limits for tasks assessed                                 General Comments: Pt able to recall 2/3 words after 5 min delay with min questioning cueing.  She was oriented to year, month, and day of the week as well as reason for hospitalization.                Home Living Family/patient expects to be discharged to:: Private residence Living Arrangements: Parent;Other (Comment) (brother, neices, and nephew)   Type of Home: House Home Access: Stairs to enter [employer] of Steps: 2, one up to the porch and one into the house   Home Layout: One level     Bathroom Shower/Tub: Producer, television/film/video: Standard Bathroom Accessibility: Yes   Home Equipment: Agricultural consultant (2 wheels)          Prior Functioning/Environment Prior Level of Function : Independent/Modified Independent             Mobility Comments: used RW  in the house ADLs Comments: sister helps wash her up        OT Problem List: Decreased activity tolerance;Impaired balance (sitting and/or standing);Decreased knowledge of use of DME or AE      OT Treatment/Interventions: Self-care/ADL training;Balance training;Patient/family education;Therapeutic activities;DME and/or AE instruction;Therapeutic exercise;Energy conservation    OT Goals(Current goals can be found in the care plan section) Acute Rehab OT Goals Patient Stated Goal: Pt did not state but agreeable to getting up to the bedside recliner. OT Goal Formulation: With patient Time For Goal Achievement: 06/01/21 Potential to Achieve Goals: Good ADL Goals Pt Will Perform Grooming: with modified independence;standing (two tasks) Pt Will Perform Lower Body Bathing: with supervision;sit to/from stand Pt Will Perform Lower Body Dressing: with supervision;sit to/from stand Pt Will Transfer to Toilet: with modified independence;bedside commode;ambulating Additional ADL Goal #1: Pt will verbalize at least 3 energy conservation strategies for selfcare tasks.  OT Frequency: Min 2X/week       AM-PAC OT "6 Clicks" Daily Activity     Outcome Measure Help from another person eating meals?: None Help from another person taking care of personal grooming?: A Little Help from another person toileting, which includes using toliet, bedpan, or urinal?: A Little Help from another person bathing (including washing, rinsing, drying)?: A  Little Help from another person to put on and taking off regular upper body clothing?: None Help from another person to put on and taking off regular lower body clothing?: A Little 6 Click Score: 20   End of Session Equipment Utilized During Treatment: Gait belt;Rolling walker (2 wheels) Nurse Communication: Mobility status  Activity Tolerance: Patient tolerated treatment well Patient left: in chair;with call bell/phone within reach;with chair alarm set  OT Visit  Diagnosis: Unsteadiness on feet (R26.81);Muscle weakness (generalized) (M62.81)                Time: 5284-1324 OT Time Calculation (min): 31 min Charges:  OT General Charges $OT Visit: 1 Visit OT Evaluation $OT Eval Moderate Complexity: 1 Mod OT Treatments $Self Care/Home Management : 8-22 mins Hero Kulish OTR/L 05/18/2021, 1:35 PM

## 2021-05-18 NOTE — Telephone Encounter (Signed)
Pt has an hospital f/u scheduled for 5/24 with [facility]  ?

## 2021-05-18 NOTE — Progress Notes (Signed)
?                                  PROGRESS NOTE                                             ?                                                                                                                     ?                                         ? ? Patient Demographics:  ? ? Rachel Nolan, is a 51 y.o. female, DOB - 07-Aug-1970, BWL:[id number] ? ?Outpatient Primary MD for the patient is Pcp, No    LOS - 0  Admit date - 05/17/2021   ? ?Chief Complaint  ?Patient presents with  ? Shortness of Breath  ?    ? ?Brief Narrative (HPI from H&P)  51 y.o. female with medical history significant of hypertension, asthma/COPD, chronic respiratory failure on 2 L, anxiety, depression, and polysubstance abuse (including cocaine, marijuana, tobacco, and remote history alcohol abuse) presented hospital with shortness of breath, she had a recent admission few weeks ago for asthma exacerbation, also came to the ER the day before this admission but left AMA, in the ER this time she was diagnosed with chronic asthma exacerbation with polysubstance abuse and admitted to the hospital ? ? Subjective:  ? ? [name] today has, No headache, + chest pain with coughing spells, No abdominal pain - No Nausea, No new weakness tingling or numbness, ++ cough and SOB ? ? Assessment  & Plan :  ? ? ?Acute on chronic hypoxic respiratory failure caused by acute on chronic asthma exacerbation in the setting of ongoing smoking, Marijuana and cocaine abuse .  Patient at baseline uses 2 L of nasal cannula oxygen, currently on 4 L and maintaining saturations in low 90s, still having nonstop intense coughing spells and has diffuse wheezing.  She has been strictly counseled to abstain from smoking, marijuana and cocaine abuse, supplemental oxygen to keep pulse ox over 90%, switch to IV steroids add azithromycin, scheduled and as needed nebulizer treatments.  Supportive treatment for coughing spells and coughing spell  related musculoskeletal chest discomfort.  Will continue Singulair and monitor. ? ?2.  Polysubstance abuse.  Admits to continued smoking, marijuana and cocaine abuse.  Counseled to quit all.  Avoid beta-blocker. ? ?3.  Hypertension.  Cardizem for now. ? ?4.  OSA.  CPAP nightly. ? ?5.  Anxiety and history of depression along with PTSD.  Continue home regimen which includes Prozac, hydroxyzine, Seroquel and prazosin. ? ? ?   ? ?  Condition - Guarded ? ?Family Communication  :  None present ? ?Code Status :  Full ? ?Consults  :  None ? ?PUD Prophylaxis :  ? ? Procedures  :    ? ?  ? ?   ? ?Disposition Plan  :   ? ?Status is: Observation ? ?DVT Prophylaxis  :   ? ?enoxaparin (LOVENOX) injection 40 mg Start: 05/17/21 1045 ? ? ?Lab Results  ?Component Value Date  ? PLT 237 05/18/2021  ? ? ?Diet :  ?Diet Order   ? ?       ?  Diet Heart Room service appropriate? Yes; Fluid consistency: Thin  Diet effective now       ?  ? ?  ?  ? ?  ?  ? ?Inpatient Medications ? ?Scheduled Meds: ? azithromycin  500 mg Oral Daily  ? chlorpheniramine-HYDROcodone  5 mL Oral Q12H  ? diltiazem  60 mg Oral Q12H  ? enoxaparin (LOVENOX) injection  40 mg Subcutaneous Daily  ? FLUoxetine  40 mg Oral q morning  ? gabapentin  200 mg Oral TID  ? guaiFENesin  600 mg Oral BID  ? ipratropium-albuterol  3 mL Nebulization QID  ? mouth rinse  15 mL Mouth Rinse BID  ? methylPREDNISolone (SOLU-MEDROL) injection  60 mg Intravenous Q12H  ? montelukast  10 mg Oral QHS  ? nicotine  21 mg Transdermal Daily  ? prazosin  1 mg Oral QHS  ? QUEtiapine  100 mg Oral QHS  ? sodium chloride flush  3 mL Intravenous Q12H  ? ?Continuous Infusions: ?PRN Meds:.acetaminophen **OR** acetaminophen, albuterol, guaiFENesin-codeine, hydrALAZINE, LORazepam, ondansetron **OR** ondansetron (ZOFRAN) IV, traMADol, traZODone ? ?Antibiotics  :   ? ?Anti-infectives (From admission, onward)  ? ? Start     Dose/Rate Route Frequency Ordered Stop  ? 05/18/21 1000  azithromycin (ZITHROMAX) tablet 500  mg       ? 500 mg Oral Daily 05/18/21 0804    ? ?  ? ? ? Time Spent in minutes  30 ? ? ?Susa Raring M.D on 05/18/2021 at 8:43 AM ? ?To page go to [email or web address]  ? ?[facility] -  Office  [phone] ? ?See all Orders from today for further details ? ? ? Objective:  ? ?Vitals:  ? 05/18/21 0000 05/18/21 0400 05/18/21 0813 05/18/21 0824  ?BP: 108/72 123/80  109/70  ?Pulse: 99 83  95  ?Resp: 14 20  18   ?Temp: 98.4 ?F (36.9 ?C) (!) 97 ?F (36.1 ?C)  98.2 ?F (36.8 ?C)  ?TempSrc: Axillary Axillary  Oral  ?SpO2: 96% 97% 99% 100%  ? ? ?Wt Readings from Last 3 Encounters:  ?05/11/21 84.7 kg  ?04/21/21 81.6 kg  ?03/28/21 79.4 kg  ? ? ? ?Intake/Output Summary (Last 24 hours) at 05/18/2021 0843 ?Last data filed at 05/18/2021 0554 ?Gross per 24 hour  ?Intake 1565.97 ml  ?Output --  ?Net 1565.97 ml  ? ? ? ?Physical Exam ? ?Awake Alert, No new F.N deficits, Normal affect ?[place].AT,PERRAL ?Supple Neck, No JVD,   ?Symmetrical Chest wall movement, Good air movement bilaterally, ++ wheeing ?RRR,No Gallops,Rubs or new Murmurs,  ?+ve B.Sounds, Abd Soft, No tenderness,   ?No Cyanosis, Clubbing or edema  ?  ?  ? ? Data Review:  ? ? ?CBC ?Recent Labs  ?Lab 05/12/21 ?0114 05/13/21 ?0136 05/17/21 ?05/19/21 05/18/21 ?0246  ?WBC 6.5 5.1 6.4 8.2  ?HGB 11.6* 11.8* 13.4 11.6*  ?HCT 34.6* 35.6* 40.0 34.3*  ?PLT 206 216 295  237  ?MCV 92.8 94.4 94.1 93.2  ?MCH 31.1 31.3 31.5 31.5  ?MCHC 33.5 33.1 33.5 33.8  ?RDW 15.4 15.5 14.8 14.6  ? ? ?Electrolytes ?Recent Labs  ?Lab 05/12/21 ?0114 05/13/21 ?0136 05/17/21 ?[id number] 05/18/21 ?[id number] 05/18/21 ?0246 05/18/21 ?[id number]  ?NA 140 140 139  --  140  --   ?K 4.1 4.3 4.1  --  3.6  --   ?CL 107 106 105  --  105  --   ?CO2 27 28 29   --  28  --   ?GLUCOSE 97 151* 86  --  96  --   ?BUN 13 16 9   --  19  --   ?CREATININE 0.84 0.79 0.60  --  0.70  --   ?CALCIUM 9.3 9.4 9.4  --  9.1  --   ?MG 2.2 2.1  --   --   --   --   ?CRP  --   --   --   --   --  0.7  ?PROCALCITON  --   --   --  <0.10  --   --   ?BNP  --   --   --  13.7  --    --   ? ? ? ?Radiology Reports ?DG Chest 2 View ? ?Result Date: 05/17/2021 ?CLINICAL DATA:  Shortness of breath and wheezing. EXAM: CHEST - 2 VIEW COMPARISON:  05/11/2021 FINDINGS: Lungs are hyperexpanded. The lungs are clear without focal pneumonia, edema, pneumothorax or pleural effusion. Interstitial markings are diffusely coarsened with chronic features. The cardiopericardial silhouette is within normal limits for size. The visualized bony structures of the thorax are unremarkable. Spinal fusion rod incompletely visualized. Telemetry leads overlie the chest. IMPRESSION: Hyperexpansion without acute cardiopulmonary findings. Electronically Signed   By: Kennith CenterEric  Mansell M.D.   On: 05/17/2021 07:49  ? ?DG Chest Port 1 View ? ?Result Date: 05/18/2021 ?CLINICAL DATA:  Shortness of breath EXAM: PORTABLE CHEST 1 VIEW COMPARISON:  05/17/2021 FINDINGS: Artifact from EKG leads. Scoliosis and partially covered thoracolumbar fusion hardware. There is no edema, consolidation, effusion, or pneumothorax. Normal heart size and stable mediastinal contours. IMPRESSION: No evidence of active disease. Electronically Signed   By: Tiburcio PeaJonathan  Watts M.D.   On: 05/18/2021 06:32    ? ? ?

## 2021-05-18 NOTE — Progress Notes (Signed)
Patient c/o chest pain and nausea, exacerbated by coughing. Patient verbalized that chest pain comes on when pt coughs. BP 142/94, HR 105, RR 27, O2sat 98% on 2LNC. EKG done and showed sinus tachycardia. On call provider notified, no new orders given. Pt was given Zofran and nebulizer treatment, which relieved symptoms. Will continue to monitor.  ?

## 2021-05-18 NOTE — Evaluation (Signed)
Physical Therapy Evaluation and Discharge ?Patient Details ?Name: Rachel Nolan ?MRN: [id number] ?DOB: 25-Aug-1970 ?Today's Date: 05/18/2021 ? ?History of Present Illness ? 51 y.o. female with medical history significant of hypertension, asthma/COPD, chronic respiratory failure on 2 L, anxiety, depression, and polysubstance abuse (including cocaine, marijuana, tobacco, and remote history alcohol abuse) presented hospital with shortness of breath. acute on chronic asthma exacerbation secondary to polysubstance abuse  ?Clinical Impression ?  ?Patient evaluated by Physical Therapy with no further acute PT needs identified. Patient at her baseline for mobility. See below for equipment needs. PT is signing off. Thank you for this referral. ?   ?   ? ?Recommendations for follow up therapy are one component of a multi-disciplinary discharge planning process, led by the attending physician.  Recommendations may be updated based on patient status, additional functional criteria and insurance authorization. ? ?Follow Up Recommendations No PT follow up ? ?  ?Assistance Recommended at Discharge PRN  ?Patient can return home with the following ? Assistance with cooking/housework ? ?  ?Equipment Recommendations Rolling walker (2 wheels)  ?Recommendations for Other Services ?    ?  ?Functional Status Assessment Patient has not had a recent decline in their functional status  ? ?  ?Precautions / Restrictions Precautions ?Precautions: Fall ?Restrictions ?Weight Bearing Restrictions: No  ? ?  ? ?Mobility ? Bed Mobility ?  ?  ?  ?  ?  ?  ?  ?  ?  ? ?Transfers ?Overall transfer level: Modified independent ?Equipment used: Rolling walker (2 wheels) ?Transfers: Sit to/from Stand ?Sit to Stand: Modified independent (Device/Increase time) ?  ?  ?  ?  ?  ?  ?  ? ?Ambulation/Gait ?Ambulation/Gait assistance: Modified independent (Device/Increase time) ?Gait Distance (Feet): 200 Feet ?Assistive device: Rolling walker (2 wheels) ?Gait  Pattern/deviations: Step-through pattern, Trunk flexed ?  ?Gait velocity interpretation: >2.62 ft/sec, indicative of community ambulatory ?  ?General Gait Details: pt reports she cannot walk upright due to her scoliosis and previous back surgery ? ?Stairs ?  ?  ?  ?  ?  ? ?Wheelchair Mobility ?  ? ?Modified Rankin (Stroke Patients Only) ?  ? ?  ? ?Balance Overall balance assessment: Needs assistance ?Sitting-balance support: No upper extremity supported, Feet supported ?Sitting balance-Leahy Scale: Good ?  ?  ?Standing balance support: During functional activity, Reliant on assistive device for balance ?Standing balance-Leahy Scale: Fair ?  ?  ?  ?  ?  ?  ?  ?  ?  ?  ?  ?  ?   ? ? ? ?Pertinent Vitals/Pain Pain Assessment ?Pain Assessment: No/denies pain  ? ? ?Home Living Family/patient expects to be discharged to:: Private residence ?Living Arrangements: Parent;Other (Comment) (brother, neices, and nephew) ?Available Help at Discharge: Available PRN/intermittently ?Type of Home: House ?Home Access: Stairs to enter ?  ?Entrance Stairs-Number of Steps: 2, one up to the porch and one into the house ?  ?Home Layout: One level ?Home Equipment: Agricultural consultant (2 wheels) ?Additional Comments: Pt was previously living with sister; reports they had a "falling out" and lives in an apartment with her 'friend'. Pt reports sister threw everything of hers away, including AE and brace  ?  ?Prior Function Prior Level of Function : Independent/Modified Independent ?  ?  ?  ?  ?  ?  ?Mobility Comments: used RW in the house (until it was thrown away by her sister) ?ADLs Comments: sister helps wash her up ?  ? ? ?Hand Dominance  ?  Dominant Hand: Right ? ?  ?Extremity/Trunk Assessment  ? Upper Extremity Assessment ?Upper Extremity Assessment: Defer to OT evaluation ?  ? ?Lower Extremity Assessment ?Lower Extremity Assessment: Overall WFL for tasks assessed ?  ? ?Cervical / Trunk Assessment ?Cervical / Trunk Assessment: Other  exceptions ?Cervical / Trunk Exceptions: scoliosis  ?Communication  ? Communication: No difficulties  ?Cognition Arousal/Alertness: Awake/alert ?Behavior During Therapy: [facility] for tasks assessed/performed ?Overall Cognitive Status: Within Functional Limits for tasks assessed ?  ?  ?  ?  ?  ?  ?  ?  ?  ?  ?  ?  ?  ?  ?  ?  ?  ?  ?  ? ?  ?General Comments General comments (skin integrity, edema, etc.): Patient able to reach outside BOS in sitting and standing. ? ?  ?Exercises    ? ?Assessment/Plan  ?  ?PT Assessment Patient does not need any further PT services  ?PT Problem List   ? ?   ?  ?PT Treatment Interventions     ? ?PT Goals (Current goals can be found in the Care Plan section)  ?Acute Rehab PT Goals ?Patient Stated Goal: get another RW ?PT Goal Formulation: All assessment and education complete, DC therapy ? ?  ?Frequency   ?  ? ? ?Co-evaluation   ?  ?  ?  ?  ? ? ?  ?AM-PAC PT "6 Clicks" Mobility  ?Outcome Measure Help needed turning from your back to your side while in a flat bed without using bedrails?: None ?Help needed moving from lying on your back to sitting on the side of a flat bed without using bedrails?: None ?Help needed moving to and from a bed to a chair (including a wheelchair)?: None ?Help needed standing up from a chair using your arms (e.g., wheelchair or bedside chair)?: None ?Help needed to walk in hospital room?: None ?Help needed climbing 3-5 steps with a railing? : None ?6 Click Score: 24 ? ?  ?End of Session Equipment Utilized During Treatment: Oxygen ?Activity Tolerance: Patient tolerated treatment well ?Patient left: in bed;with call bell/phone within reach;with bed alarm set ?Nurse Communication: Mobility status;Other (comment) (no PT needs) ?PT Visit Diagnosis: Difficulty in walking, not elsewhere classified (R26.2) ?  ? ?Time: 1027-2536 ?PT Time Calculation (min) (ACUTE ONLY): 13 min ? ? ?Charges:   PT Evaluation ?$PT Eval Low Complexity: 1 Low ?  ?  ?   ? ? ? ?Jerolyn Center, PT ?Acute  Rehabilitation Services  ?Pager [phone] ?Office [phone] ? ? ?Scherrie November Lanier Millon ?05/18/2021, 2:45 PM ? ?

## 2021-05-18 NOTE — Progress Notes (Addendum)
?  Transition of Care (TOC) Screening Note ? ? ?Patient Details  ?Name: Rachel Nolan ?Date of Birth: 1970/04/10 ? ? ?Transition of Care (TOC) CM/SW Contact:    ?Cyndi Bender, RN ?Phone Number: ?05/18/2021, 8:53 AM ? ? ? ?Transition of Care Department [facility]) has reviewed patient. Patient will need substance abuse counseling. Patient had chronic home 02. With [employer]. Requested CMA make PCP appointment. We will continue to monitor patient advancement through interdisciplinary progression rounds.  ?

## 2021-05-18 NOTE — Progress Notes (Signed)
SATURATION QUALIFICATIONS: (This note is used to comply with regulatory documentation for home oxygen) ? ?Patient Saturations on Room Air at Rest = 86% ? ?Patient Saturations on Room Air while Ambulating = N/A dropped at rest ? ?Patient Saturations on 2 Liters of oxygen while Ambulating = 92% ? ?Please briefly explain why patient needs home oxygen: ? ?To maintain oxygen saturation >87% during functional activities. ? ? ?Jerolyn Center, PT ?Acute Rehabilitation Services  ?Pager [phone] ?Office [phone] ? ?

## 2021-05-19 ENCOUNTER — Other Ambulatory Visit (HOSPITAL_COMMUNITY): Payer: Self-pay

## 2021-05-19 ENCOUNTER — Inpatient Hospital Stay: Payer: Self-pay | Admitting: Family Medicine

## 2021-05-19 ENCOUNTER — Other Ambulatory Visit: Payer: Self-pay

## 2021-05-19 LAB — CBC WITH DIFFERENTIAL/PLATELET
Abs Immature Granulocytes: 0.07 10*3/uL (ref 0.00–0.07)
Basophils Absolute: 0 10*3/uL (ref 0.0–0.1)
Basophils Relative: 0 %
Eosinophils Absolute: 0 10*3/uL (ref 0.0–0.5)
Eosinophils Relative: 0 %
HCT: 36.5 % (ref 36.0–46.0)
Hemoglobin: 11.9 g/dL — ABNORMAL LOW (ref 12.0–15.0)
Immature Granulocytes: 1 %
Lymphocytes Relative: 8 %
Lymphs Abs: 0.8 10*3/uL (ref 0.7–4.0)
MCH: 30.9 pg (ref 26.0–34.0)
MCHC: 32.6 g/dL (ref 30.0–36.0)
MCV: 94.8 fL (ref 80.0–100.0)
Monocytes Absolute: 0.3 10*3/uL (ref 0.1–1.0)
Monocytes Relative: 4 %
Neutro Abs: 7.9 10*3/uL — ABNORMAL HIGH (ref 1.7–7.7)
Neutrophils Relative %: 87 %
Platelets: 252 10*3/uL (ref 150–400)
RBC: 3.85 MIL/uL — ABNORMAL LOW (ref 3.87–5.11)
RDW: 15 % (ref 11.5–15.5)
WBC: 9.1 10*3/uL (ref 4.0–10.5)
nRBC: 0 % (ref 0.0–0.2)

## 2021-05-19 LAB — COMPREHENSIVE METABOLIC PANEL
ALT: 12 U/L (ref 0–44)
AST: 16 U/L (ref 15–41)
Albumin: 3.3 g/dL — ABNORMAL LOW (ref 3.5–5.0)
Alkaline Phosphatase: 54 U/L (ref 38–126)
Anion gap: 7 (ref 5–15)
BUN: 16 mg/dL (ref 6–20)
CO2: 25 mmol/L (ref 22–32)
Calcium: 9.3 mg/dL (ref 8.9–10.3)
Chloride: 108 mmol/L (ref 98–111)
Creatinine, Ser: 0.69 mg/dL (ref 0.44–1.00)
GFR, Estimated: >60 mL/min (ref >60)
Glucose, Bld: 165 mg/dL — ABNORMAL HIGH (ref 70–99)
Potassium: 4.1 mmol/L (ref 3.5–5.1)
Sodium: 140 mmol/L (ref 135–145)
Total Bilirubin: 0.5 mg/dL (ref 0.3–1.2)
Total Protein: 6.5 g/dL (ref 6.5–8.1)

## 2021-05-19 LAB — PROCALCITONIN: Procalcitonin: <0.1 ng/mL

## 2021-05-19 LAB — C-REACTIVE PROTEIN: CRP: 0.5 mg/dL (ref <1.0)

## 2021-05-19 LAB — MAGNESIUM: Magnesium: 2.1 mg/dL (ref 1.7–2.4)

## 2021-05-19 LAB — BRAIN NATRIURETIC PEPTIDE: B Natriuretic Peptide: 20.5 pg/mL (ref 0.0–100.0)

## 2021-05-19 MED ORDER — PREDNISONE 5 MG PO TABS
ORAL_TABLET | ORAL | 0 refills | Status: DC
  Filled 2021-05-19: qty 65, 18d supply, fill #0

## 2021-05-19 MED ORDER — AZITHROMYCIN 500 MG PO TABS
500.0000 mg | ORAL_TABLET | Freq: Every day | ORAL | 0 refills | Status: DC
  Filled 2021-05-19: qty 3, 3d supply, fill #0

## 2021-05-19 MED ORDER — GUAIFENESIN-CODEINE 100-10 MG/5ML PO SOLN
10.0000 mL | Freq: Four times a day (QID) | ORAL | 0 refills | Status: DC | PRN
Start: 2021-05-19 — End: 2021-07-09
  Filled 2021-05-19: qty 120, 3d supply, fill #0

## 2021-05-19 NOTE — Plan of Care (Signed)
?  ?                                    [facility] ?                           [address] [place], [place] [address] ?  ? ? ? ?Rachel Nolan was admitted to the Hospital on 05/17/2021 and Discharged  05/19/2021 and should be excused from work/school  ? ?for 4  days starting from date -  05/17/2021 , may return to work/school without any restrictions. ? ?Call Susa Raring MD, [facility]  [phone] with questions. ? ?Susa Raring M.D on 05/19/2021,at 9:47 AM ? ?[facility]   ?Office  [phone] ? ?

## 2021-05-19 NOTE — Discharge Summary (Signed)
?                                                                                ? ?Danaysha Kirn KGU:[id number] DOB: 02-15-1970 DOA: 05/17/2021 ? ?PCP: Pcp, No ? ?Admit date: 05/17/2021  Discharge date: 05/19/2021 ? ?Admitted From: Home   Disposition:  Home ? ? ?Recommendations for Outpatient Follow-up:  ? ?Follow up with PCP in 1-2 weeks ? ?PCP Please obtain BMP/CBC, 2 view CXR in 1week,  (see Discharge instructions)  ? ?PCP Please follow up on the following pending results:  ? ? ?Home Health: None   ?Equipment/Devices: None  ?Consultations: None  ?Discharge Condition: Stable   ?CODE STATUS: Full  ?Diet Recommendation: Heart Healthy  ?  ? ?Chief Complaint  ?Patient presents with  ? Shortness of Breath  ?  ? ?Brief history of present illness from the day of admission and additional interim summary   ? ?51 y.o. female with medical history significant of hypertension, asthma/COPD, chronic respiratory failure on 2 L, anxiety, depression, and polysubstance abuse (including cocaine, marijuana, tobacco, and remote history alcohol abuse) presented hospital with shortness of breath, she had a recent admission few weeks ago for asthma exacerbation, also came to the ER the day before this admission but left AMA, in the ER this time she was diagnosed with chronic asthma exacerbation with polysubstance abuse and admitted to the hospital ? ?                                                               Hospital Course  ? ?  ?Acute on chronic hypoxic respiratory failure caused by acute on chronic asthma exacerbation in the setting of ongoing smoking, Marijuana and cocaine abuse .  Patient at baseline uses 2 L of nasal cannula oxygen, was initially on 4 L nasal cannula oxygen in the hospital upon admission, she also had nonstop severe coughing spells, she was placed on supportive care for cough suppression, was placed on IV steroids along  with oral azithromycin, much improved today feeling close to baseline down to 2 L nasal cannula oxygen, diffuse wheezing and cough have almost completely resolved, she will be discharged home on 3 more days of oral azithromycin, cough suppressant and prednisone taper, strictly counseled to quit smoking, marijuana and cocaine use, PCP to monitor.  May benefit from outpatient pulmonary follow-up will defer this to PCP. ?  ?2.  Polysubstance abuse.  Admits to continued smoking, marijuana and cocaine abuse.  Counseled to quit all.  Avoid beta-blocker. ?  ?3.  Hypertension.  Continue home dose Norvasc. ?  ?4.  OSA.  CPAP nightly. ?  ?5.  Anxiety and history of depression along with PTSD.  Continue home regimen which includes Prozac, hydroxyzine, Seroquel and prazosin. ? ? ?Discharge diagnosis   ? ? ?Active Problems: ?  Chronic respiratory failure with hypoxia (HCC) ?  Asthma exacerbation ?  Cocaine abuse (HCC) ?  Tobacco abuse ?  Post traumatic stress disorder (  PTSD) ?  Anxiety and depression ?  OSA (obstructive sleep apnea) ?  Marijuana user ? ? ? ?Discharge instructions   ? ?Discharge Instructions   ? ? Diet - low sodium heart healthy   Complete by: As directed ?  ? Discharge instructions   Complete by: As directed ?  ? Follow with Primary MD in 7 days  ? ?Get CBC, CMP, 2 view Chest X ray -  checked next visit within 1 week by Primary MD   ? ?Activity: As tolerated with Full fall precautions use walker/cane & assistance as needed ? ?Disposition Home   ? ?Diet: Heart Healthy   ? ?Special Instructions: If you have smoked or chewed Tobacco  in the last 2 yrs please stop smoking, stop any regular Alcohol  and or any Recreational drug use. ? ?On your next visit with your primary care physician please Get Medicines reviewed and adjusted. ? ?Please request your Prim.MD to go over all Hospital Tests and Procedure/Radiological results at the follow up, please get all Hospital records sent to your Prim MD by signing hospital  release before you go home. ? ?If you experience worsening of your admission symptoms, develop shortness of breath, life threatening emergency, suicidal or homicidal thoughts you must seek medical attention immediately by calling 911 or calling your MD immediately  if symptoms less severe. ? ?You Must read complete instructions/literature along with all the possible adverse reactions/side effects for all the Medicines you take and that have been prescribed to you. Take any new Medicines after you have completely understood and accpet all the possible adverse reactions/side effects.  ? Increase activity slowly   Complete by: As directed ?  ? ?  ? ? ?Discharge Medications  ? ?Allergies as of 05/19/2021   ?No Known Allergies ?  ? ?  ?Medication List  ?  ? ?STOP taking these medications   ? ?ibuprofen 200 MG tablet ?Commonly known as: ADVIL ?  ?predniSONE 10 MG tablet ?Commonly known as: DELTASONE ?Replaced by: predniSONE 5 MG tablet ?  ? ?  ? ?TAKE these medications   ? ?albuterol 108 (90 Base) MCG/ACT inhaler ?Commonly known as: VENTOLIN HFA ?Inhale 2 puffs into the lungs every 4 (four) hours as needed for shortness of breath or wheezing. ?  ?albuterol (2.5 MG/3ML) 0.083% nebulizer solution ?Commonly known as: PROVENTIL ?Take 3 mLs (2.5 mg total) by nebulization every 4 (four) hours as needed for wheezing or shortness of breath. ?  ?amLODipine 5 MG tablet ?Commonly known as: NORVASC ?Take 1 tablet (5 mg total) by mouth daily. ?  ?azithromycin 500 MG tablet ?Commonly known as: ZITHROMAX ?Take 1 tablet (500 mg total) by mouth daily. ?Start taking on: May 20, 2021 ?  ?FLUoxetine 40 MG capsule ?Commonly known as: PROZAC ?Take 1 capsule (40 mg total) by mouth every morning. ?  ?gabapentin 100 MG capsule ?Commonly known as: NEURONTIN ?Take 2 capsules (200 mg total) by mouth 3 (three) times daily. ?  ?guaiFENesin-codeine 100-10 MG/5ML syrup ?Take 10 mLs by mouth every 6 (six) hours as needed for cough. ?  ?hydrOXYzine 50 MG  tablet ?Commonly known as: ATARAX ?Take 1 tablet (50 mg total) by mouth 3 (three) times daily as needed for anxiety. ?  ?montelukast 10 MG tablet ?Commonly known as: SINGULAIR ?Take 1 tablet (10 mg total) by mouth at bedtime. ?  ?nicotine 21 mg/24hr patch ?Commonly known as: NICODERM CQ - dosed in mg/24 hours ?Place 1 patch (21 mg total) onto the skin  daily. ?  ?NON FORMULARY ?CPAP at bedtime ?  ?OXYGEN ?Inhale 2 L into the lungs daily as needed (shortness of breath). ?  ?prazosin 1 MG capsule ?Commonly known as: MINIPRESS ?Take 1 capsule (1 mg total) by mouth at bedtime. ?  ?predniSONE 5 MG tablet ?Commonly known as: DELTASONE ?Label  & dispense according to the schedule below. take 8 Pills PO for 3 days, 6 Pills PO for 3 days, 4 Pills PO for 3 days, 2 Pills PO for 3 days, 1 Pills PO for 3 days, 1/2 Pill  PO for 3 days then STOP. Total 65 pills. ?Replaces: predniSONE 10 MG tablet ?  ?QUEtiapine 100 MG tablet ?Commonly known as: SEROQUEL ?Take 1 tablet (100 mg total) by mouth at bedtime. ?  ?traZODone 150 MG tablet ?Commonly known as: DESYREL ?Take 1 tablet (150 mg total) by mouth at bedtime as needed for sleep. ?  ? ?  ? ? ? Follow-up Information   ? ? Hoy Register, MD Follow up.   ?Specialty: Family Medicine ?Why: TIME : 11:10 AM ?DATE : MAY 24 , 2023 ?Contact information: ?[address] [place] ?[address] ?[place] [address] ?[phone] ? ? ?  ?  ? ? Care, [facility] .   ?Specialty: Home Health Services ?Contact information: ?[address] ?[address] ?[place] [address] ?[phone] ? ? ?  ?  ? ?  ?  ? ?  ? ? ?Major procedures and Radiology Reports - PLEASE review detailed and final reports thoroughly  -    ? ?  ?DG Chest 2 View ? ?Result Date: 05/17/2021 ?CLINICAL DATA:  Shortness of breath and wheezing. EXAM: CHEST - 2 VIEW COMPARISON:  05/11/2021 FINDINGS: Lungs are hyperexpanded. The lungs are clear without focal pneumonia, edema, pneumothorax or pleural effusion. Interstitial markings are  diffusely coarsened with chronic features. The cardiopericardial silhouette is within normal limits for size. The visualized bony structures of the thorax are unremarkable. Spinal fusion rod incompletely visualize

## 2021-05-19 NOTE — Discharge Instructions (Signed)
Follow with Primary MD  in 7 days   Get CBC, CMP, 2 view Chest X ray -  checked next visit within 1 week by Primary MD   Activity: As tolerated with Full fall precautions use walker/cane & assistance as needed  Disposition Home    Diet: Heart Healthy    Special Instructions: If you have smoked or chewed Tobacco  in the last 2 yrs please stop smoking, stop any regular Alcohol  and or any Recreational drug use.  On your next visit with your primary care physician please Get Medicines reviewed and adjusted.  Please request your Prim.MD to go over all Hospital Tests and Procedure/Radiological results at the follow up, please get all Hospital records sent to your Prim MD by signing hospital release before you go home.  If you experience worsening of your admission symptoms, develop shortness of breath, life threatening emergency, suicidal or homicidal thoughts you must seek medical attention immediately by calling 911 or calling your MD immediately  if symptoms less severe.  You Must read complete instructions/literature along with all the possible adverse reactions/side effects for all the Medicines you take and that have been prescribed to you. Take any new Medicines after you have completely understood and accpet all the possible adverse reactions/side effects.      

## 2021-05-19 NOTE — TOC Transition Note (Addendum)
Transition of Care (TOC) - CM/SW Discharge Note ? ? ?Patient Details  ?Name: Rachel Nolan ?MRN: [id number] ?Date of Birth: 1970-02-20 ? ?Transition of Care (TOC) CM/SW Contact:  ?Harriet Masson, RN ?Phone Number: ?05/19/2021, 10:20 AM ? ? ?Clinical Narrative:    ?Patient is stable for discharge. [name] is able to transport home. Patient has chronic home 02 with [employer]. Spoke to [place] with [employer] and ordered portable tank to be delivered to room. New PCP apt made at [facility]. Patient accepted substance abuse counseling resources. Prescriptions sent to [facility] pharmacy. Patient unable to afford medications. Used petty cash to pay for medications. ? ?  ?  ? ? ?Patient Goals and CMS Choice ?  ? Go home ?  ? ?Discharge Placement ?  ?          home ?  ?  ?  ?  ? ?Discharge Plan and Services ?  ?  ?           ?  ?  ? home ?  ?  ?  ?  ?  ?  ?  ? ?Social Determinants of Health (SDOH) Interventions ?  ? ? ?Readmission Risk Interventions ? ?  05/19/2021  ? 10:10 AM 05/13/2021  ? 11:33 AM 12/04/2019  ? 11:07 AM  ?Readmission Risk Prevention Plan  ?Transportation Screening Complete Complete Complete  ?PCP or Specialist Appt within 5-7 Days   Complete  ?Home Care Screening   Complete  ?Medication Review (RN CM)   Complete  ?Medication Review Oceanographer) Complete Complete   ?PCP or Specialist appointment within 3-5 days of discharge  Not Complete   ?PCP/Specialist Appt Not Complete comments first apt is 5/24 apt wll be made for [facility] new pcp apt   ?HRI or Home Care Consult Complete Complete   ?SW Recovery Care/Counseling Consult Complete Complete   ?Palliative Care Screening Not Applicable Not Applicable   ?Skilled Nursing Facility Not Applicable Not Applicable   ? ? ? ? ? ?

## 2021-05-28 ENCOUNTER — Other Ambulatory Visit: Payer: Self-pay

## 2021-06-04 ENCOUNTER — Other Ambulatory Visit: Payer: Self-pay

## 2021-06-05 ENCOUNTER — Other Ambulatory Visit: Payer: Self-pay

## 2021-06-10 ENCOUNTER — Inpatient Hospital Stay: Payer: Self-pay | Admitting: Physician Assistant

## 2021-06-10 NOTE — Progress Notes (Unsigned)
Patient ID: Rachel Nolan, female   DOB: [birth year], 51 y.o.   MRN: [id number]  After hospitalization 4/30-05/19/2021.  From discharge summary:  51 y.o. female with medical history significant of hypertension, asthma/COPD, chronic respiratory failure on 2 L, anxiety, depression, and polysubstance abuse (including cocaine, marijuana, tobacco, and remote history alcohol abuse) presented hospital with shortness of breath, she had a recent admission few weeks ago for asthma exacerbation, also came to the ER the day before this admission but left AMA, in the ER this time she was diagnosed with chronic asthma exacerbation with polysubstance abuse and admitted to the hospital    Acute on chronic hypoxic respiratory failure caused by acute on chronic asthma exacerbation in the setting of ongoing smoking, Marijuana and cocaine abuse .  Patient at baseline uses 2 L of nasal cannula oxygen, was initially on 4 L nasal cannula oxygen in the hospital upon admission, she also had nonstop severe coughing spells, she was placed on supportive care for cough suppression, was placed on IV steroids along with oral azithromycin, much improved today feeling close to baseline down to 2 L nasal cannula oxygen, diffuse wheezing and cough have almost completely resolved, she will be discharged home on 3 more days of oral azithromycin, cough suppressant and prednisone taper, strictly counseled to quit smoking, marijuana and cocaine use, PCP to monitor.  May benefit from outpatient pulmonary follow-up will defer this to PCP.   2.  Polysubstance abuse.  Admits to continued smoking, marijuana and cocaine abuse.  Counseled to quit all.  Avoid beta-blocker.   3.  Hypertension.  Continue home dose Norvasc.   4.  OSA.  CPAP nightly.   5.  Anxiety and history of depression along with PTSD.  Continue home regimen which includes Prozac, hydroxyzine, Seroquel and prazosin. Active Problems:   Chronic respiratory failure with hypoxia  (HCC)   Asthma exacerbation   Cocaine abuse (HCC)   Tobacco abuse   Post traumatic stress disorder (PTSD)   Anxiety and depression   OSA (obstructive sleep apnea)   Marijuana user

## 2021-07-04 ENCOUNTER — Other Ambulatory Visit: Payer: Self-pay

## 2021-07-04 ENCOUNTER — Encounter (HOSPITAL_COMMUNITY): Payer: Self-pay

## 2021-07-04 ENCOUNTER — Emergency Department (HOSPITAL_COMMUNITY): Payer: Self-pay

## 2021-07-04 ENCOUNTER — Inpatient Hospital Stay (HOSPITAL_COMMUNITY)
Admission: EM | Admit: 2021-07-04 | Discharge: 2021-07-09 | DRG: 202 | Disposition: A | Discharge status: Home / Self Care | Payer: Self-pay | Attending: Internal Medicine | Admitting: Internal Medicine

## 2021-07-04 DIAGNOSIS — R9431 Abnormal electrocardiogram [ECG] [EKG]: Secondary | ICD-10-CM

## 2021-07-04 DIAGNOSIS — F1990 Other psychoactive substance use, unspecified, uncomplicated: Secondary | ICD-10-CM

## 2021-07-04 DIAGNOSIS — F1410 Cocaine abuse, uncomplicated: Secondary | ICD-10-CM | POA: Diagnosis present

## 2021-07-04 DIAGNOSIS — F1911 Other psychoactive substance abuse, in remission: Secondary | ICD-10-CM

## 2021-07-04 DIAGNOSIS — Z716 Tobacco abuse counseling: Secondary | ICD-10-CM

## 2021-07-04 DIAGNOSIS — Z8249 Family history of ischemic heart disease and other diseases of the circulatory system: Secondary | ICD-10-CM

## 2021-07-04 DIAGNOSIS — Z7151 Drug abuse counseling and surveillance of drug abuser: Secondary | ICD-10-CM

## 2021-07-04 DIAGNOSIS — G4733 Obstructive sleep apnea (adult) (pediatric): Secondary | ICD-10-CM | POA: Diagnosis present

## 2021-07-04 DIAGNOSIS — J449 Chronic obstructive pulmonary disease, unspecified: Secondary | ICD-10-CM | POA: Diagnosis present

## 2021-07-04 DIAGNOSIS — J9611 Chronic respiratory failure with hypoxia: Secondary | ICD-10-CM

## 2021-07-04 DIAGNOSIS — J4541 Moderate persistent asthma with (acute) exacerbation: Secondary | ICD-10-CM

## 2021-07-04 DIAGNOSIS — F1910 Other psychoactive substance abuse, uncomplicated: Secondary | ICD-10-CM | POA: Insufficient documentation

## 2021-07-04 DIAGNOSIS — Z79899 Other long term (current) drug therapy: Secondary | ICD-10-CM

## 2021-07-04 DIAGNOSIS — F17210 Nicotine dependence, cigarettes, uncomplicated: Secondary | ICD-10-CM | POA: Diagnosis present

## 2021-07-04 DIAGNOSIS — Z20822 Contact with and (suspected) exposure to covid-19: Secondary | ICD-10-CM | POA: Diagnosis present

## 2021-07-04 DIAGNOSIS — J9621 Acute and chronic respiratory failure with hypoxia: Secondary | ICD-10-CM

## 2021-07-04 DIAGNOSIS — F322 Major depressive disorder, single episode, severe without psychotic features: Secondary | ICD-10-CM

## 2021-07-04 DIAGNOSIS — J441 Chronic obstructive pulmonary disease with (acute) exacerbation: Principal | ICD-10-CM

## 2021-07-04 DIAGNOSIS — I10 Essential (primary) hypertension: Secondary | ICD-10-CM | POA: Diagnosis present

## 2021-07-04 DIAGNOSIS — M419 Scoliosis, unspecified: Secondary | ICD-10-CM | POA: Diagnosis present

## 2021-07-04 DIAGNOSIS — F329 Major depressive disorder, single episode, unspecified: Secondary | ICD-10-CM | POA: Diagnosis present

## 2021-07-04 DIAGNOSIS — F4310 Post-traumatic stress disorder, unspecified: Secondary | ICD-10-CM | POA: Diagnosis present

## 2021-07-04 DIAGNOSIS — J45901 Unspecified asthma with (acute) exacerbation: Principal | ICD-10-CM | POA: Diagnosis present

## 2021-07-04 LAB — TROPONIN I (HIGH SENSITIVITY): Troponin I (High Sensitivity): 5 ng/L (ref <18)

## 2021-07-04 LAB — CBC WITH DIFFERENTIAL/PLATELET
Abs Immature Granulocytes: 0.01 10*3/uL (ref 0.00–0.07)
Basophils Absolute: 0 10*3/uL (ref 0.0–0.1)
Basophils Relative: 1 %
Eosinophils Absolute: 0.2 10*3/uL (ref 0.0–0.5)
Eosinophils Relative: 4 %
HCT: 38.4 % (ref 36.0–46.0)
Hemoglobin: 12.6 g/dL (ref 12.0–15.0)
Immature Granulocytes: 0 %
Lymphocytes Relative: 43 %
Lymphs Abs: 1.6 10*3/uL (ref 0.7–4.0)
MCH: 31.3 pg (ref 26.0–34.0)
MCHC: 32.8 g/dL (ref 30.0–36.0)
MCV: 95.3 fL (ref 80.0–100.0)
Monocytes Absolute: 0.4 10*3/uL (ref 0.1–1.0)
Monocytes Relative: 12 %
Neutro Abs: 1.5 10*3/uL — ABNORMAL LOW (ref 1.7–7.7)
Neutrophils Relative %: 40 %
Platelets: 179 10*3/uL (ref 150–400)
RBC: 4.03 MIL/uL (ref 3.87–5.11)
RDW: 14.9 % (ref 11.5–15.5)
WBC: 3.7 10*3/uL — ABNORMAL LOW (ref 4.0–10.5)
nRBC: 0 % (ref 0.0–0.2)

## 2021-07-04 LAB — RESP PANEL BY RT-PCR (FLU A&B, COVID) ARPGX2
Influenza A by PCR: NEGATIVE
Influenza B by PCR: NEGATIVE
SARS Coronavirus 2 by RT PCR: NEGATIVE

## 2021-07-04 LAB — BRAIN NATRIURETIC PEPTIDE: B Natriuretic Peptide: 7.6 pg/mL (ref 0.0–100.0)

## 2021-07-04 LAB — BASIC METABOLIC PANEL
Anion gap: 9 (ref 5–15)
BUN: 9 mg/dL (ref 6–20)
CO2: 25 mmol/L (ref 22–32)
Calcium: 9.3 mg/dL (ref 8.9–10.3)
Chloride: 107 mmol/L (ref 98–111)
Creatinine, Ser: 0.76 mg/dL (ref 0.44–1.00)
GFR, Estimated: >60 mL/min (ref >60)
Glucose, Bld: 90 mg/dL (ref 70–99)
Potassium: 3.9 mmol/L (ref 3.5–5.1)
Sodium: 141 mmol/L (ref 135–145)

## 2021-07-04 MED ORDER — STERILE WATER FOR INJECTION IJ SOLN
INTRAMUSCULAR | Status: AC
  Filled 2021-07-04: qty 10

## 2021-07-04 MED ORDER — MAGNESIUM SULFATE 2 GM/50ML IV SOLN
2.0000 g | Freq: Once | INTRAVENOUS | Status: AC
  Administered 2021-07-04: 2 g via INTRAVENOUS
  Filled 2021-07-04: qty 50

## 2021-07-04 MED ORDER — ACETAMINOPHEN 650 MG RE SUPP: 650.0000 mg | Freq: Four times a day (QID) | RECTAL | Status: DC | PRN

## 2021-07-04 MED ORDER — ALBUTEROL SULFATE (2.5 MG/3ML) 0.083% IN NEBU
2.5000 mg | INHALATION_SOLUTION | RESPIRATORY_TRACT | Status: DC | PRN
  Administered 2021-07-07: 2.5 mg via RESPIRATORY_TRACT
  Filled 2021-07-04: qty 3

## 2021-07-04 MED ORDER — ENOXAPARIN SODIUM 40 MG/0.4ML IJ SOSY
40.0000 mg | PREFILLED_SYRINGE | INTRAMUSCULAR | Status: DC
  Administered 2021-07-04 – 2021-07-08 (×5): 40 mg via SUBCUTANEOUS
  Filled 2021-07-04 (×5): qty 0.4

## 2021-07-04 MED ORDER — METHYLPREDNISOLONE SODIUM SUCC 125 MG IJ SOLR
80.0000 mg | Freq: Two times a day (BID) | INTRAMUSCULAR | Status: DC
  Administered 2021-07-05: 80 mg via INTRAVENOUS
  Filled 2021-07-04: qty 2

## 2021-07-04 MED ORDER — SENNOSIDES-DOCUSATE SODIUM 8.6-50 MG PO TABS: 1.0000 | ORAL_TABLET | Freq: Every evening | ORAL | Status: DC | PRN

## 2021-07-04 MED ORDER — HYDRALAZINE HCL 25 MG PO TABS: 25.0000 mg | ORAL_TABLET | Freq: Four times a day (QID) | ORAL | Status: DC | PRN

## 2021-07-04 MED ORDER — IPRATROPIUM-ALBUTEROL 0.5-2.5 (3) MG/3ML IN SOLN
3.0000 mL | Freq: Once | RESPIRATORY_TRACT | Status: AC
  Administered 2021-07-04: 3 mL via RESPIRATORY_TRACT
  Filled 2021-07-04: qty 3

## 2021-07-04 MED ORDER — METHYLPREDNISOLONE SODIUM SUCC 125 MG IJ SOLR
125.0000 mg | Freq: Once | INTRAMUSCULAR | Status: AC
  Administered 2021-07-04: 125 mg via INTRAVENOUS
  Filled 2021-07-04: qty 2

## 2021-07-04 MED ORDER — NICOTINE 21 MG/24HR TD PT24
21.0000 mg | MEDICATED_PATCH | Freq: Every day | TRANSDERMAL | Status: DC
  Administered 2021-07-04 – 2021-07-09 (×5): 21 mg via TRANSDERMAL
  Filled 2021-07-04 (×6): qty 1

## 2021-07-04 MED ORDER — KETOROLAC TROMETHAMINE 30 MG/ML IJ SOLN
15.0000 mg | Freq: Four times a day (QID) | INTRAMUSCULAR | Status: DC | PRN
  Administered 2021-07-07: 15 mg via INTRAVENOUS
  Filled 2021-07-04: qty 1

## 2021-07-04 MED ORDER — ALBUTEROL (5 MG/ML) CONTINUOUS INHALATION SOLN
7.5000 mg/h | INHALATION_SOLUTION | Freq: Once | RESPIRATORY_TRACT | Status: AC
  Administered 2021-07-04: 7.5 mg/h via RESPIRATORY_TRACT
  Filled 2021-07-04: qty 0.5

## 2021-07-04 MED ORDER — ALBUTEROL SULFATE (2.5 MG/3ML) 0.083% IN NEBU
INHALATION_SOLUTION | RESPIRATORY_TRACT | Status: AC
  Administered 2021-07-04: 2.5 mg
  Filled 2021-07-04: qty 9

## 2021-07-04 MED ORDER — PREDNISONE 20 MG PO TABS: 40.0000 mg | ORAL_TABLET | Freq: Every day | ORAL | Status: DC

## 2021-07-04 MED ORDER — SODIUM CHLORIDE 0.9% FLUSH
3.0000 mL | Freq: Two times a day (BID) | INTRAVENOUS | Status: DC
  Administered 2021-07-05 – 2021-07-09 (×8): 3 mL via INTRAVENOUS

## 2021-07-04 MED ORDER — ACETAMINOPHEN 325 MG PO TABS
650.0000 mg | ORAL_TABLET | Freq: Four times a day (QID) | ORAL | Status: DC | PRN
  Filled 2021-07-04: qty 2

## 2021-07-04 MED ORDER — ALBUTEROL SULFATE HFA 108 (90 BASE) MCG/ACT IN AERS: 2.0000 | INHALATION_SPRAY | RESPIRATORY_TRACT | Status: DC | PRN

## 2021-07-04 NOTE — ED Notes (Signed)
Received verbal report from Megan R at this time °

## 2021-07-04 NOTE — H&P (Signed)
History and Physical    Rachel Nolan EYC:[id number] DOB: 1970-09-20 DOA: 07/04/2021  PCP: Pcp, No   Patient coming from: Home   Chief Complaint: SOB, cough, wheezing   HPI: Rachel Nolan is a pleasant 51 y.o. female with medical history significant for asthma, chronic hypoxic respiratory failure, hypertension, depression, anxiety, PTSD, and polysubstance abuse, now presenting to the emergency department with shortness of breath, cough, and wheezing.  Patient reports that she developed a worsening in her chronic shortness of breath and cough earlier today, was not able to get any relief from her rescue inhalers, and continued to worsen throughout the day.  She denies any fevers, chills, leg swelling, rhinorrhea, or sore throat.  She believes that her symptoms are precipitated by mold in her apartment.  She also notes that she continues to smoke up to 2 packs/day of cigarettes, and last smoked cocaine 2 days ago.  She only drinks approximately 1 beer per week and has not used heroin or any other substances in 5 years now.  She had been able to cut back on her tobacco use with nicotine patches but ran out.  She is interested in quitting cocaine, but has difficulty due to living around chronic users.  ED Course: Upon arrival to the ED, patient is found to be afebrile and saturating 100% on 3 L/min of supplemental oxygen with mild tachypnea.  EKG features a sinus rhythm with QTc interval 505 ms.  Chest x-ray negative for acute cardiopulmonary disease.  Chemistry panel unremarkable, COVID and influenza PCR negative, troponin and BNP normal, and CBC with WBC 3700 (ANC 1480).  She was treated with IV magnesium, IV Solu-Medrol, DuoNeb, and albuterol treatment in the ED but continues to be dyspneic at rest.  Review of Systems:  All other systems reviewed and apart from HPI, are negative.  Past Medical History:  Diagnosis Date   Anxiety    Asthma    COPD (chronic obstructive pulmonary disease) (HCC)     Depression    Hypertension    Scoliosis     Past Surgical History:  Procedure Laterality Date   BACK SURGERY     ECTOPIC PREGNANCY SURGERY      Social History:   reports that she has been smoking cigarettes. She has been smoking an average of .5 packs per day. She has never used smokeless tobacco. She reports current alcohol use. She reports current drug use. Drugs: Marijuana, Cocaine, and "Crack" cocaine.  No Known Allergies  Family History  Problem Relation Age of Onset   Hypertension Mother    Hypertension Father      Prior to Admission medications   Medication Sig Start Date End Date Taking? Authorizing Provider  albuterol (PROVENTIL) (2.5 MG/3ML) 0.083% nebulizer solution Take 3 mLs (2.5 mg total) by nebulization every 4 (four) hours as needed for wheezing or shortness of breath. 05/13/21 05/13/22  Pokhrel, Rebekah Chesterfield, MD  albuterol (VENTOLIN HFA) 108 (90 Base) MCG/ACT inhaler Inhale 2 puffs into the lungs every 4 (four) hours as needed for shortness of breath or wheezing. 04/08/21   Clapacs, Jackquline Denmark, MD  amLODipine (NORVASC) 5 MG tablet Take 1 tablet (5 mg total) by mouth daily. 05/14/21 05/14/22  Pokhrel, Rebekah Chesterfield, MD  azithromycin (ZITHROMAX) 500 MG tablet Take 1 tablet (500 mg total) by mouth daily. 05/20/21   Leroy Sea, MD  FLUoxetine (PROZAC) 40 MG capsule Take 1 capsule (40 mg total) by mouth every morning. 04/08/21   Clapacs, Jackquline Denmark, MD  gabapentin (NEURONTIN) 100  MG capsule Take 2 capsules (200 mg total) by mouth 3 (three) times daily. 04/08/21   Clapacs, Jackquline Denmark, MD  guaiFENesin-codeine 100-10 MG/5ML syrup Take 10 mLs by mouth every 6 (six) hours as needed for cough. 05/19/21   Leroy Sea, MD  hydrOXYzine (ATARAX) 50 MG tablet Take 1 tablet (50 mg total) by mouth 3 (three) times daily as needed for anxiety. 04/08/21   Clapacs, Jackquline Denmark, MD  montelukast (SINGULAIR) 10 MG tablet Take 1 tablet (10 mg total) by mouth at bedtime. 04/08/21   Clapacs, Jackquline Denmark, MD  nicotine  (NICODERM CQ - DOSED IN MG/24 HOURS) 21 mg/24hr patch Place 1 patch (21 mg total) onto the skin daily. 04/08/21   Clapacs, Jackquline Denmark, MD  NON FORMULARY CPAP at bedtime    [provider]  OXYGEN Inhale 2 L into the lungs daily as needed (shortness of breath).    [provider]  prazosin (MINIPRESS) 1 MG capsule Take 1 capsule (1 mg total) by mouth at bedtime. 04/08/21   Clapacs, Jackquline Denmark, MD  predniSONE (DELTASONE) 5 MG tablet Take 8 tablets by mouth daily for 3 days, 6 tablets daily for 3 days, 4 tabs daily for 3 days, 2 tabs daily for 3 days, 1 tablet daily for 3 days, 1/2 tablet daily for 3 days then STOP. 05/19/21   Leroy Sea, MD  QUEtiapine (SEROQUEL) 100 MG tablet Take 1 tablet (100 mg total) by mouth at bedtime. 04/08/21   Clapacs, Jackquline Denmark, MD  traZODone (DESYREL) 150 MG tablet Take 1 tablet (150 mg total) by mouth at bedtime as needed for sleep. 04/08/21   Clapacs, Jackquline Denmark, MD  citalopram (CELEXA) 20 MG tablet Take 1 tablet (20 mg total) by mouth daily. 03/07/19 07/23/19  Aldean Baker, NP  lisinopril (ZESTRIL) 10 MG tablet Take 1 tablet (10 mg total) by mouth daily. 03/07/19 09/11/19  Aldean Baker, NP  pantoprazole (PROTONIX) 40 MG tablet Take 1 tablet (40 mg total) by mouth daily. 03/07/19 07/23/19  Aldean Baker, NP    Physical Exam: Vitals:   07/04/21 1830 07/04/21 1833 07/04/21 1915 07/04/21 1940  BP: (!) 160/83     Pulse: 67  94   Resp: (!) 21  20   Temp:      TempSrc:      SpO2: 100% 100% 98%   Weight:    84.8 kg  Height:    5\' 6"  (1.676 m)    Constitutional: NAD, calm  Eyes: PERTLA, lids and conjunctivae normal, dysconjugate gaze  ENMT: Mucous membranes are moist. Posterior pharynx clear of any exudate or lesions.   Neck: supple, no masses  Respiratory: Diminished bilaterally with prolonged expiratory phase and expiratory wheezes. Speaking full sentences.  Cardiovascular: S1 & S2 heard, regular rate and rhythm. No extremity edema.  Abdomen: No distension, no  tenderness, soft. Bowel sounds active.  Musculoskeletal: no clubbing / cyanosis. No joint deformity upper and lower extremities.   Skin: no significant rashes, lesions, ulcers. Warm, dry, well-perfused. Neurologic: No dysarthria or aphasia. Restless. Moving all extremities. Alert and oriented to person, place, and situation.  Psychiatric: Pleasant. Cooperative.    Labs and Imaging on Admission: I have personally reviewed following labs and imaging studies  CBC: Recent Labs  Lab 07/04/21 1644  WBC 3.7*  NEUTROABS 1.5*  HGB 12.6  HCT 38.4  MCV 95.3  PLT 179   Basic Metabolic Panel: Recent Labs  Lab 07/04/21 1644  NA 141  K  3.9  CL 107  CO2 25  GLUCOSE 90  BUN 9  CREATININE 0.76  CALCIUM 9.3   GFR: Estimated Creatinine Clearance: 90.3 mL/min (by C-G formula based on SCr of 0.76 mg/dL). Liver Function Tests: No results for input(s): "AST", "ALT", "ALKPHOS", "BILITOT", "PROT", "ALBUMIN" in the last 168 hours. No results for input(s): "LIPASE", "AMYLASE" in the last 168 hours. No results for input(s): "AMMONIA" in the last 168 hours. Coagulation Profile: No results for input(s): "INR", "PROTIME" in the last 168 hours. Cardiac Enzymes: No results for input(s): "CKTOTAL", "CKMB", "CKMBINDEX", "TROPONINI" in the last 168 hours. BNP (last 3 results) No results for input(s): "PROBNP" in the last 8760 hours. HbA1C: No results for input(s): "HGBA1C" in the last 72 hours. CBG: No results for input(s): "GLUCAP" in the last 168 hours. Lipid Profile: No results for input(s): "CHOL", "HDL", "LDLCALC", "TRIG", "CHOLHDL", "LDLDIRECT" in the last 72 hours. Thyroid Function Tests: No results for input(s): "TSH", "T4TOTAL", "FREET4", "T3FREE", "THYROIDAB" in the last 72 hours. Anemia Panel: No results for input(s): "VITAMINB12", "FOLATE", "FERRITIN", "TIBC", "IRON", "RETICCTPCT" in the last 72 hours. Urine analysis:    Component Value Date/Time   COLORURINE STRAW (A) 07/14/2020  0600   APPEARANCEUR CLEAR 07/14/2020 0600   LABSPEC 1.015 07/14/2020 0600   PHURINE 7.0 07/14/2020 0600   GLUCOSEU NEGATIVE 07/14/2020 0600   HGBUR NEGATIVE 07/14/2020 0600   BILIRUBINUR NEGATIVE 07/14/2020 0600   KETONESUR NEGATIVE 07/14/2020 0600   PROTEINUR NEGATIVE 07/14/2020 0600   NITRITE NEGATIVE 07/14/2020 0600   LEUKOCYTESUR NEGATIVE 07/14/2020 0600   Sepsis Labs: @LABRCNTIP (procalcitonin:4,lacticidven:4) ) Recent Results (from the past 240 hour(s))  Resp Panel by RT-PCR (Flu A&B, Covid) Anterior Nasal Swab     Status: None   Collection Time: 07/04/21  4:51 PM   Specimen: Anterior Nasal Swab  Result Value Ref Range Status   SARS Coronavirus 2 by RT PCR NEGATIVE NEGATIVE Final    Comment: (NOTE) SARS-CoV-2 target nucleic acids are NOT DETECTED.  The SARS-CoV-2 RNA is generally detectable in upper respiratory specimens during the acute phase of infection. The lowest concentration of SARS-CoV-2 viral copies this assay can detect is 138 copies/mL. A negative result does not preclude SARS-Cov-2 infection and should not be used as the sole basis for treatment or other patient management decisions. A negative result may occur with  improper specimen collection/handling, submission of specimen other than nasopharyngeal swab, presence of viral mutation(s) within the areas targeted by this assay, and inadequate number of viral copies(<138 copies/mL). A negative result must be combined with clinical observations, patient history, and epidemiological information. The expected result is Negative.  Fact Sheet for Patients:  07/06/21  Fact Sheet for Healthcare Providers:  BloggerCourse.com  This test is no t yet approved or cleared by the [place] FDA and  has been authorized for detection and/or diagnosis of SARS-CoV-2 by FDA under an Emergency Use Authorization (EUA). This EUA will remain  in effect (meaning this  test can be used) for the duration of the COVID-19 declaration under Section 564(b)(1) of the Act, 21 U.S.C.section 360bbb-3(b)(1), unless the authorization is terminated  or revoked sooner.       Influenza A by PCR NEGATIVE NEGATIVE Final   Influenza B by PCR NEGATIVE NEGATIVE Final    Comment: (NOTE) The Xpert Xpress SARS-CoV-2/FLU/RSV plus assay is intended as an aid in the diagnosis of influenza from Nasopharyngeal swab specimens and should not be used as a sole basis for treatment. Nasal washings and aspirates are unacceptable  for Xpert Xpress SARS-CoV-2/FLU/RSV testing.  Fact Sheet for Patients: BloggerCourse.com  Fact Sheet for Healthcare Providers: SeriousBroker.it  This test is not yet approved or cleared by the [place] FDA and has been authorized for detection and/or diagnosis of SARS-CoV-2 by FDA under an Emergency Use Authorization (EUA). This EUA will remain in effect (meaning this test can be used) for the duration of the COVID-19 declaration under Section 564(b)(1) of the Act, 21 U.S.C. section 360bbb-3(b)(1), unless the authorization is terminated or revoked.  Performed at [facility], [address], [place], [place] [address]      Radiological Exams on Admission: DG Chest Portable 1 View  Result Date: 07/04/2021 CLINICAL DATA:  Shortness of breath EXAM: PORTABLE CHEST 1 VIEW COMPARISON:  05/18/2021 FINDINGS: Cardiac size is within normal limits. There are no signs of pulmonary edema or focal pulmonary consolidation. There is no pleural effusion or pneumothorax. Harrington rod is noted in the thoracolumbar region. IMPRESSION: No active disease. Electronically Signed   By: Ernie Avena M.D.   On: 07/04/2021 17:04    EKG: Independently reviewed. SR, QTc 505 ms.   Assessment/Plan   1. Acute asthma exacerbation; chronic hypoxic respiratory failure  - Pt with asthma and chronic 2 Lpm  supplemental O2 requirement presents with 1 day worsening cough, SOB, and wheeze  - She was treated with systemic steroid, IV magnesium, SABA-SAMA, and additional SABA in ED  - Continue systemic steroid and SABA treatments, continue supplemental O2   2. Prolonged QT interval  - QTc is 505 ms in ED  - Continue cardiac monitoring, check magnesium level, avoid QT-prolonging medications    3. OSA  - CPAP qHS    4. Hypertension  - Plan to continue home regimen    5. Depression, anxiety, PTSD  - Appears stable, plan to continue home regimen    6. Substance abuse  - Smokes up to 2 ppd, had been able to cut back with nicotine patches but ran out; smoked cocaine 2 days ago; rarely drinks and has gone 5 yrs now without using heroin  - Nicotine patch and cessation counseling provided     DVT prophylaxis: Lovenox  Code Status: Full  Level of Care: Level of care: Telemetry Medical Family Communication: None present  Disposition Plan:  Patient is from: home  Anticipated d/c is to: home  Anticipated d/c date is: 6/18 or 07/06/21 Patient currently: pending improved respiratory status  Consults called: none  Admission status: Observation     Briscoe Deutscher, MD [facility]  07/04/2021, 8:01 PM

## 2021-07-04 NOTE — ED Notes (Signed)
Admit provider at bedside 

## 2021-07-04 NOTE — ED Provider Notes (Signed)
[facility] EMERGENCY DEPARTMENT Provider Note   CSN: [id number] Arrival date & time: 07/04/21  1606     History  Chief Complaint  Patient presents with   Shortness of Breath    Rachel Nolan is a 51 y.o. female history of asthma/COPD, polysubstance use disorder, tobacco use, chronic hypoxic respiratory failure for evaluation of shortness of breath.  Began earlier today.  Noted to have diffuse wheezing in all 4 lung fields, not moving much air.  States she has some chest tightness her denies any overt pain.  She is still using tobacco daily.  She used crack cocaine less than 48 hours ago.  Not using any home medications.  She denies any lower extremity edema, history of PE or DVT.  No fever, sick contacts.  She has cough without hemoptysis.  Last admitted approximately 6 months ago for COPD exacerbation  HPI     Home Medications Prior to Admission medications   Medication Sig Start Date End Date Taking? Authorizing Provider  albuterol (PROVENTIL) (2.5 MG/3ML) 0.083% nebulizer solution Take 3 mLs (2.5 mg total) by nebulization every 4 (four) hours as needed for wheezing or shortness of breath. 05/13/21 05/13/22  Pokhrel, Rebekah Chesterfield, MD  albuterol (VENTOLIN HFA) 108 (90 Base) MCG/ACT inhaler Inhale 2 puffs into the lungs every 4 (four) hours as needed for shortness of breath or wheezing. 04/08/21   Clapacs, Jackquline Denmark, MD  amLODipine (NORVASC) 5 MG tablet Take 1 tablet (5 mg total) by mouth daily. 05/14/21 05/14/22  Pokhrel, Rebekah Chesterfield, MD  azithromycin (ZITHROMAX) 500 MG tablet Take 1 tablet (500 mg total) by mouth daily. 05/20/21   Leroy Sea, MD  FLUoxetine (PROZAC) 40 MG capsule Take 1 capsule (40 mg total) by mouth every morning. 04/08/21   Clapacs, Jackquline Denmark, MD  gabapentin (NEURONTIN) 100 MG capsule Take 2 capsules (200 mg total) by mouth 3 (three) times daily. 04/08/21   Clapacs, Jackquline Denmark, MD  guaiFENesin-codeine 100-10 MG/5ML syrup Take 10 mLs by mouth every 6 (six) hours as  needed for cough. 05/19/21   Leroy Sea, MD  hydrOXYzine (ATARAX) 50 MG tablet Take 1 tablet (50 mg total) by mouth 3 (three) times daily as needed for anxiety. 04/08/21   Clapacs, Jackquline Denmark, MD  montelukast (SINGULAIR) 10 MG tablet Take 1 tablet (10 mg total) by mouth at bedtime. 04/08/21   Clapacs, Jackquline Denmark, MD  nicotine (NICODERM CQ - DOSED IN MG/24 HOURS) 21 mg/24hr patch Place 1 patch (21 mg total) onto the skin daily. 04/08/21   Clapacs, Jackquline Denmark, MD  NON FORMULARY CPAP at bedtime    [provider]  OXYGEN Inhale 2 L into the lungs daily as needed (shortness of breath).    [provider]  prazosin (MINIPRESS) 1 MG capsule Take 1 capsule (1 mg total) by mouth at bedtime. 04/08/21   Clapacs, Jackquline Denmark, MD  predniSONE (DELTASONE) 5 MG tablet Take 8 tablets by mouth daily for 3 days, 6 tablets daily for 3 days, 4 tabs daily for 3 days, 2 tabs daily for 3 days, 1 tablet daily for 3 days, 1/2 tablet daily for 3 days then STOP. 05/19/21   Leroy Sea, MD  QUEtiapine (SEROQUEL) 100 MG tablet Take 1 tablet (100 mg total) by mouth at bedtime. 04/08/21   Clapacs, Jackquline Denmark, MD  traZODone (DESYREL) 150 MG tablet Take 1 tablet (150 mg total) by mouth at bedtime as needed for sleep. 04/08/21   Clapacs, Jackquline Denmark, MD  citalopram (CELEXA) 20 MG tablet Take 1 tablet (20 mg total) by mouth daily. 03/07/19 07/23/19  Aldean Baker, NP  lisinopril (ZESTRIL) 10 MG tablet Take 1 tablet (10 mg total) by mouth daily. 03/07/19 09/11/19  Aldean Baker, NP  pantoprazole (PROTONIX) 40 MG tablet Take 1 tablet (40 mg total) by mouth daily. 03/07/19 07/23/19  Aldean Baker, NP      Allergies    Patient has no known allergies.    Review of Systems   Review of Systems  Constitutional: Negative.   HENT: Negative.    Respiratory:  Positive for cough, chest tightness, shortness of breath and wheezing.   Cardiovascular: Negative.   Gastrointestinal: Negative.   Genitourinary: Negative.   Musculoskeletal: Negative.    Skin: Negative.   Neurological: Negative.   All other systems reviewed and are negative.   Physical Exam Updated Vital Signs BP (!) 160/83   Pulse 67   Temp 98.3 F (36.8 C) (Oral)   Resp (!) 21   LMP 04/19/2018   SpO2 100%  Physical Exam Vitals and nursing note reviewed.  Constitutional:      General: She is in acute distress.     Appearance: She is well-developed. She is not ill-appearing, toxic-appearing or diaphoretic.  HENT:     Head: Atraumatic.     Mouth/Throat:     Mouth: Mucous membranes are moist.  Eyes:     Pupils: Pupils are equal, round, and reactive to light.  Cardiovascular:     Rate and Rhythm: Normal rate.     Pulses: Normal pulses.          Radial pulses are 2+ on the right side and 2+ on the left side.       Dorsalis pedis pulses are 2+ on the right side and 2+ on the left side.     Heart sounds: Normal heart sounds.  Pulmonary:     Effort: Tachypnea, accessory muscle usage and respiratory distress present.     Breath sounds: Decreased breath sounds and wheezing present.     Comments: Can shake head yes and no to answer questions.  Patient in respiratory distress, accessory muscle usage.  Inspiratory, expiratory wheeze, not moving much air.  Tachypneic.  Increased oxygen 4 L via nasal cannula Chest:     Comments: Nontender, no crepitus or step-off Abdominal:     General: Bowel sounds are normal. There is no distension.     Palpations: Abdomen is soft.     Comments: Soft, nontender  Musculoskeletal:        General: Normal range of motion.     Cervical back: Normal range of motion.     Right lower leg: No tenderness. No edema.     Left lower leg: No tenderness. No edema.     Comments: No bony tenderness, full range of motion, compartment soft  Skin:    General: Skin is warm and dry.     Capillary Refill: Capillary refill takes less than 2 seconds.  Neurological:     General: No focal deficit present.     Mental Status: She is alert.   Psychiatric:        Mood and Affect: Mood normal.     ED Results / Procedures / Treatments   Labs (all labs ordered are listed, but only abnormal results are displayed) Labs Reviewed  CBC WITH DIFFERENTIAL/PLATELET - Abnormal; Notable for the following components:      Result Value   WBC 3.7 (*)  Neutro Abs 1.5 (*)    All other components within normal limits  RESP PANEL BY RT-PCR (FLU A&B, COVID) ARPGX2  BASIC METABOLIC PANEL  BRAIN NATRIURETIC PEPTIDE  TROPONIN I (HIGH SENSITIVITY)  TROPONIN I (HIGH SENSITIVITY)    EKG None  Radiology DG Chest Portable 1 View  Result Date: 07/04/2021 CLINICAL DATA:  Shortness of breath EXAM: PORTABLE CHEST 1 VIEW COMPARISON:  05/18/2021 FINDINGS: Cardiac size is within normal limits. There are no signs of pulmonary edema or focal pulmonary consolidation. There is no pleural effusion or pneumothorax. Harrington rod is noted in the thoracolumbar region. IMPRESSION: No active disease. Electronically Signed   By: Ernie Avena M.D.   On: 07/04/2021 17:04    Procedures .Critical Care  Performed by: Linwood Dibbles, PA-C Authorized by: Linwood Dibbles, PA-C   Critical care provider statement:    Critical care time (minutes):  35   Critical care was necessary to treat or prevent imminent or life-threatening deterioration of the following conditions:  Respiratory failure   Critical care was time spent personally by me on the following activities:  Development of treatment plan with patient or surrogate, discussions with consultants, evaluation of patient's response to treatment, examination of patient, ordering and review of laboratory studies, ordering and review of radiographic studies, ordering and performing treatments and interventions, pulse oximetry, re-evaluation of patient's condition and review of old charts     Medications Ordered in ED Medications  albuterol (VENTOLIN HFA) 108 (90 Base) MCG/ACT inhaler 2 puff (has no  administration in time range)  magnesium sulfate IVPB 2 g 50 mL (0 g Intravenous Stopped 07/04/21 1742)  methylPREDNISolone sodium succinate (SOLU-MEDROL) 125 mg/2 mL injection 125 mg (125 mg Intravenous Given 07/04/21 1712)  albuterol (PROVENTIL,VENTOLIN) solution continuous neb (7.5 mg/hr Nebulization Given 07/04/21 1711)  sterile water (preservative free) injection (  Given 07/04/21 1830)  albuterol (PROVENTIL) (2.5 MG/3ML) 0.083% nebulizer solution (2.5 mg  Given 07/04/21 1708)  ipratropium-albuterol (DUONEB) 0.5-2.5 (3) MG/3ML nebulizer solution 3 mL (3 mLs Nebulization Given 07/04/21 1833)    ED Course/ Medical Decision Making/ A&P    51 year old history of polysubstance use, chronic hypoxic respiratory failure due to asthma/COPD here for evaluation of shortness of breath which began earlier today.  Patient on arrival in acute respiratory distress, not moving much air can only shake head to answer questions.  She has accessory muscle usage.  She has increased oxygen supply from her baseline.  No clinical evidence of VTE on exam.  We will plan on labs, imaging, treat her shortness of breath with steroids, magnesium, continuous nebulizer and reassess.  Labs and imaging personally viewed and interpreted:  Xray chest without infiltrates, cardiomegaly, pulm edema, pneumothorax CBC leukopenia WBC 3.7 BMP without significant abnormality BNP 7.6 Trop 5 COVID neg EKG not ischemic changes does have prolonged QT  Patient reassessed. Breathing slightly improved.  Will give additional DuoNeb.  Reassess after multiple treatments here in the ED.  She continues to improve however still significant wheeze, and significant work of breathing with ambulation.  Will admit for further management  CONSULT with Dr. Antionette Char with [facility] who to evaluate patient for admission  At this time I have low suspicion for acute ACS, PE, dissection, pneumothorax, bacterial infectious process  The patient appears reasonably  stabilized for admission considering the current resources, flow, and capabilities available in the ED at this time, and I doubt any other [facility] requiring further screening and/or treatment in the ED prior to admission.  Medical Decision Making Amount and/or Complexity of Data Reviewed External Data Reviewed: labs, radiology, ECG and notes. Labs: ordered. Decision-making details documented in ED Course. Radiology: ordered and independent interpretation performed. Decision-making details documented in ED Course. ECG/medicine tests: ordered and independent interpretation performed. Decision-making details documented in ED Course.  Risk OTC drugs. Prescription drug management. Parenteral controlled substances. Decision regarding hospitalization. Diagnosis or treatment significantly limited by social determinants of health.         Final Clinical Impression(s) / ED Diagnoses Final diagnoses:  COPD exacerbation (HCC)  Polysubstance use disorder  Acute on chronic respiratory failure with hypoxia [facility])    Rx / DC Orders ED Discharge Orders     None         Gaddiel Cullens A, PA-C 07/04/21 2022    Linwood Dibbles, MD 07/05/21 1504

## 2021-07-04 NOTE — ED Triage Notes (Signed)
Pt c/o [facility] x1hr, pt in tripod position, O2 requirement increased from baseline 2L [place] to 3L [place], audible wheezing.  Hx asthma, COPD, compliant w home medications. No relief from rescue inhaler

## 2021-07-04 NOTE — ED Notes (Signed)
Mini lab notified to draw 2nd troponin

## 2021-07-04 NOTE — ED Notes (Signed)
Pt currently c/o 7/10 pain in the chest. Per pt it is coming from her breathing and coughing. Per pt she came in with the c/o SOB and CP. Admit provider made aware of same

## 2021-07-04 NOTE — ED Notes (Signed)
Provided pt with a [place] sandwich and ginger ale at this time

## 2021-07-04 NOTE — ED Notes (Signed)
RT notified of duo neb.

## 2021-07-04 NOTE — ED Notes (Signed)
Pt ambulated to restroom without assistance.

## 2021-07-04 NOTE — ED Notes (Signed)
Pt requesting something to drink. There is currently no order for diet at this time. Admit provider sent message reference same

## 2021-07-04 NOTE — ED Notes (Signed)
Pt ambulated to restroom without assistance at this time ?

## 2021-07-04 NOTE — ED Notes (Signed)
Provided something to drink at this time

## 2021-07-05 DIAGNOSIS — J9621 Acute and chronic respiratory failure with hypoxia: Secondary | ICD-10-CM

## 2021-07-05 DIAGNOSIS — J441 Chronic obstructive pulmonary disease with (acute) exacerbation: Secondary | ICD-10-CM

## 2021-07-05 DIAGNOSIS — J45901 Unspecified asthma with (acute) exacerbation: Principal | ICD-10-CM

## 2021-07-05 DIAGNOSIS — G4733 Obstructive sleep apnea (adult) (pediatric): Secondary | ICD-10-CM

## 2021-07-05 DIAGNOSIS — I10 Essential (primary) hypertension: Secondary | ICD-10-CM

## 2021-07-05 LAB — CBC
HCT: 37.7 % (ref 36.0–46.0)
Hemoglobin: 12.7 g/dL (ref 12.0–15.0)
MCH: 31.6 pg (ref 26.0–34.0)
MCHC: 33.7 g/dL (ref 30.0–36.0)
MCV: 93.8 fL (ref 80.0–100.0)
Platelets: 214 10*3/uL (ref 150–400)
RBC: 4.02 MIL/uL (ref 3.87–5.11)
RDW: 14.9 % (ref 11.5–15.5)
WBC: 4.9 10*3/uL (ref 4.0–10.5)
nRBC: 0 % (ref 0.0–0.2)

## 2021-07-05 LAB — BASIC METABOLIC PANEL
Anion gap: 13 (ref 5–15)
BUN: 11 mg/dL (ref 6–20)
CO2: 24 mmol/L (ref 22–32)
Calcium: 9.3 mg/dL (ref 8.9–10.3)
Chloride: 104 mmol/L (ref 98–111)
Creatinine, Ser: 0.78 mg/dL (ref 0.44–1.00)
GFR, Estimated: >60 mL/min (ref >60)
Glucose, Bld: 217 mg/dL — ABNORMAL HIGH (ref 70–99)
Potassium: 3.9 mmol/L (ref 3.5–5.1)
Sodium: 141 mmol/L (ref 135–145)

## 2021-07-05 LAB — MAGNESIUM: Magnesium: 2.2 mg/dL (ref 1.7–2.4)

## 2021-07-05 MED ORDER — FLUOXETINE HCL 20 MG PO CAPS
40.0000 mg | ORAL_CAPSULE | Freq: Every morning | ORAL | Status: DC
  Administered 2021-07-05 – 2021-07-09 (×5): 40 mg via ORAL
  Filled 2021-07-05 (×5): qty 2

## 2021-07-05 MED ORDER — AMLODIPINE BESYLATE 5 MG PO TABS
5.0000 mg | ORAL_TABLET | Freq: Every day | ORAL | Status: DC
  Administered 2021-07-05 – 2021-07-09 (×5): 5 mg via ORAL
  Filled 2021-07-05 (×5): qty 1

## 2021-07-05 MED ORDER — MONTELUKAST SODIUM 10 MG PO TABS
10.0000 mg | ORAL_TABLET | Freq: Every day | ORAL | Status: DC
  Administered 2021-07-05 – 2021-07-08 (×4): 10 mg via ORAL
  Filled 2021-07-05 (×4): qty 1

## 2021-07-05 MED ORDER — QUETIAPINE FUMARATE 100 MG PO TABS
100.0000 mg | ORAL_TABLET | Freq: Every day | ORAL | Status: DC
  Administered 2021-07-05 – 2021-07-08 (×4): 100 mg via ORAL
  Filled 2021-07-05 (×4): qty 1

## 2021-07-05 MED ORDER — HYDROXYZINE HCL 25 MG PO TABS
50.0000 mg | ORAL_TABLET | Freq: Three times a day (TID) | ORAL | Status: DC | PRN
  Administered 2021-07-08: 50 mg via ORAL
  Filled 2021-07-05: qty 2

## 2021-07-05 MED ORDER — PRAZOSIN HCL 1 MG PO CAPS
1.0000 mg | ORAL_CAPSULE | Freq: Every day | ORAL | Status: DC
  Administered 2021-07-05 – 2021-07-08 (×4): 1 mg via ORAL
  Filled 2021-07-05 (×5): qty 1

## 2021-07-05 MED ORDER — GUAIFENESIN-CODEINE 100-10 MG/5ML PO SOLN: 10.0000 mL | Freq: Four times a day (QID) | ORAL | Status: DC | PRN

## 2021-07-05 MED ORDER — TRAZODONE HCL 50 MG PO TABS
150.0000 mg | ORAL_TABLET | Freq: Every evening | ORAL | Status: DC | PRN
  Administered 2021-07-06: 150 mg via ORAL
  Filled 2021-07-05: qty 3

## 2021-07-05 MED ORDER — IPRATROPIUM-ALBUTEROL 0.5-2.5 (3) MG/3ML IN SOLN
3.0000 mL | Freq: Four times a day (QID) | RESPIRATORY_TRACT | Status: DC | PRN
Start: 2021-07-05 — End: 2021-07-09
  Administered 2021-07-05 – 2021-07-08 (×2): 3 mL via RESPIRATORY_TRACT
  Filled 2021-07-05 (×2): qty 3

## 2021-07-05 MED ORDER — GABAPENTIN 100 MG PO CAPS
200.0000 mg | ORAL_CAPSULE | Freq: Two times a day (BID) | ORAL | Status: DC
  Administered 2021-07-05 – 2021-07-09 (×9): 200 mg via ORAL
  Filled 2021-07-05 (×9): qty 2

## 2021-07-05 MED ORDER — NICOTINE 21 MG/24HR TD PT24
21.0000 mg | MEDICATED_PATCH | Freq: Every day | TRANSDERMAL | Status: DC
Start: 2021-07-05 — End: 2021-07-05

## 2021-07-05 MED ORDER — METHYLPREDNISOLONE SODIUM SUCC 125 MG IJ SOLR
60.0000 mg | Freq: Two times a day (BID) | INTRAMUSCULAR | Status: AC
  Administered 2021-07-05: 60 mg via INTRAVENOUS
  Filled 2021-07-05: qty 2

## 2021-07-05 NOTE — ED Notes (Signed)
Pt ambulated to restroom without assistance.

## 2021-07-05 NOTE — ED Notes (Signed)
Pt placed pt on cpap for the night. Pt called out for the nurse in the room. Pt is advising that it is going to fast she can't breathe with it. Advised pt to take it off at this time. Resp then made aware. Per resp it couldn't

## 2021-07-05 NOTE — Plan of Care (Signed)
  Problem: Education: Goal: Knowledge of General Education information will improve Description: Including pain rating scale, medication(s)/side effects and non-pharmacologic comfort measures Outcome: Completed/Met

## 2021-07-05 NOTE — Plan of Care (Signed)

## 2021-07-05 NOTE — ED Notes (Signed)
Patient was given a [place] Sandwich bag with a cup ice and Ginger Ale.

## 2021-07-05 NOTE — ED Notes (Signed)
Akula MD at bedside 

## 2021-07-05 NOTE — Progress Notes (Signed)
[facility]                                                                               Rachel Nolan, is a 51 y.o. female, DOB - [birth year], [id number] Admit date - 07/04/2021    Outpatient Primary MD for the patient is Pcp, No  LOS - 0  days    Brief summary     Rachel Nolan is a pleasant 51 y.o. female with medical history significant for asthma, chronic hypoxic respiratory failure, hypertension, depression, anxiety, PTSD, and polysubstance abuse, now presenting to the emergency department with shortness of breath, cough, and wheezing.   EKG features a sinus rhythm with QTc interval 505 ms.  Chest x-ray negative for acute cardiopulmonary disease.  Chemistry panel unremarkable, COVID and influenza PCR negative, troponin and BNP normal,  Assessment & Plan    Assessment and Plan:  Acute asthma exacerbation in the setting of chronic hypoxic respiratory failure:  - with persistent wheezing - continue with IV steroids, duo nebs. And oxygen as needed.    Prolonged Qtc - continue to monitor.  - recheck EKG in am.     Hypertension:  Well controlled.    Depression:  Stable.    Tobacco abuse:  On nicotine patch.    OSA on CPAP.    Estimated body mass index is 30.18 kg/m as calculated from the following:   Height as of this encounter: 5\' 6"  (1.676 m).   Weight as of this encounter: 84.8 kg.  Code Status: full code.  DVT Prophylaxis:  enoxaparin (LOVENOX) injection 40 mg Start: 07/04/21 2000   Level of Care: Level of care: Telemetry Medical Family Communication: none at bedside.  Disposition Plan:     Remains inpatient appropriate:  IV steroids.   Procedures:  None.   Consultants:   None.   Antimicrobials:   Anti-infectives (From admission, onward)    None        Medications  Scheduled Meds:  amLODipine  5 mg Oral Daily   enoxaparin (LOVENOX) injection  40 mg Subcutaneous Q24H   FLUoxetine  40 mg Oral q morning    gabapentin  200 mg Oral BID   methylPREDNISolone (SOLU-MEDROL) injection  60 mg Intravenous Q12H   montelukast  10 mg Oral QHS   nicotine  21 mg Transdermal Daily   prazosin  1 mg Oral QHS   QUEtiapine  100 mg Oral QHS   sodium chloride flush  3 mL Intravenous Q12H   Continuous Infusions: PRN Meds:.acetaminophen **OR** acetaminophen, albuterol, guaiFENesin-codeine, hydrALAZINE, hydrOXYzine, ipratropium-albuterol, ketorolac, senna-docusate, traZODone    Subjective:   Rachel Nolan was seen and examined today.  Still wheezing.   Objective:   Vitals:   07/05/21 0530 07/05/21 0600 07/05/21 0700 07/05/21 0800  BP: (!) 152/94 130/67 131/76 138/86  Pulse: (!) 49 (!) 59 (!) 55 (!) 58  Resp: (!) 29 (!) 22 19 (!) 21  Temp:      TempSrc:      SpO2: 97% 100% 100% 98%  Weight:      Height:        Intake/Output Summary (Last 24 hours) at 07/05/2021 1029 Last data filed at 07/04/2021  1742 Gross per 24 hour  Intake 50 ml  Output --  Net 50 ml   Filed Weights   07/04/21 1940  Weight: 84.8 kg     Exam General: Alert and oriented x 3, NAD Cardiovascular: S1 S2 auscultated, no murmurs, RRR Respiratory: Bilateral diffuse expiratory wheezing, air entry fair, on 2 L of nasal cannula oxygen Gastrointestinal: Soft, nontender, nondistended, + bowel sounds Ext: no pedal edema bilaterally Neuro: AAOx3, Cr N's II- XII. Strength 5/5 upper and lower extremities bilaterally Skin: No rashes Psych: Normal affect and demeanor, alert and oriented x3    Data Reviewed:  I have personally reviewed following labs and imaging studies   CBC Lab Results  Component Value Date   WBC 4.9 07/05/2021   RBC 4.02 07/05/2021   HGB 12.7 07/05/2021   HCT 37.7 07/05/2021   MCV 93.8 07/05/2021   MCH 31.6 07/05/2021   PLT 214 07/05/2021   MCHC 33.7 07/05/2021   RDW 14.9 07/05/2021   LYMPHSABS 1.6 07/04/2021   MONOABS 0.4 07/04/2021   EOSABS 0.2 07/04/2021   BASOSABS 0.0 07/04/2021     Last  metabolic panel Lab Results  Component Value Date   NA 141 07/05/2021   K 3.9 07/05/2021   CL 104 07/05/2021   CO2 24 07/05/2021   BUN 11 07/05/2021   CREATININE 0.78 07/05/2021   GLUCOSE 217 (H) 07/05/2021   GFRNONAA >60 07/05/2021   GFRAA >60 08/28/2019   CALCIUM 9.3 07/05/2021   PHOS 3.5 05/12/2021   PROT 6.5 05/19/2021   ALBUMIN 3.3 (L) 05/19/2021   BILITOT 0.5 05/19/2021   ALKPHOS 54 05/19/2021   AST 16 05/19/2021   ALT 12 05/19/2021   ANIONGAP 13 07/05/2021    CBG (last 3)  No results for input(s): "GLUCAP" in the last 72 hours.    Coagulation Profile: No results for input(s): "INR", "PROTIME" in the last 168 hours.   Radiology Studies: DG Chest Portable 1 View  Result Date: 07/04/2021 CLINICAL DATA:  Shortness of breath EXAM: PORTABLE CHEST 1 VIEW COMPARISON:  05/18/2021 FINDINGS: Cardiac size is within normal limits. There are no signs of pulmonary edema or focal pulmonary consolidation. There is no pleural effusion or pneumothorax. Harrington rod is noted in the thoracolumbar region. IMPRESSION: No active disease. Electronically Signed   By: Ernie Avena M.D.   On: 07/04/2021 17:04       Kathlen Mody M.D. [facility] 07/05/2021, 10:29 AM  Available via Epic secure chat 7am-7pm After 7 pm, please refer to night coverage provider listed on amion.

## 2021-07-05 NOTE — Progress Notes (Signed)
Patient has verbalized she does not want anyone to know she is here. She will provide her password to those are able to know about her care.

## 2021-07-05 NOTE — Progress Notes (Signed)
Only visitors with the password should be allowed to see patient per her request.

## 2021-07-06 NOTE — Plan of Care (Signed)
  Problem: Education: Goal: Knowledge of disease or condition will improve Outcome: Progressing Goal: Knowledge of the prescribed therapeutic regimen will improve Outcome: Progressing Goal: Individualized Educational Video(s) Outcome: Progressing   Problem: Activity: Goal: Ability to tolerate increased activity will improve Outcome: Progressing Goal: Will verbalize the importance of balancing activity with adequate rest periods Outcome: Progressing   Problem: Respiratory: Goal: Ability to maintain a clear airway will improve Outcome: Progressing Goal: Levels of oxygenation will improve Outcome: Progressing Goal: Ability to maintain adequate ventilation will improve Outcome: Progressing   Problem: Health Behavior/Discharge Planning: Goal: Ability to manage health-related needs will improve Outcome: Progressing   Problem: Clinical Measurements: Goal: Ability to maintain clinical measurements within normal limits will improve Outcome: Progressing Goal: Will remain free from infection Outcome: Progressing Goal: Diagnostic test results will improve Outcome: Progressing Goal: Respiratory complications will improve Outcome: Progressing Goal: Cardiovascular complication will be avoided Outcome: Progressing   Problem: Activity: Goal: Risk for activity intolerance will decrease Outcome: Progressing   Problem: Nutrition: Goal: Adequate nutrition will be maintained Outcome: Progressing   Problem: Coping: Goal: Level of anxiety will decrease Outcome: Progressing   Problem: Elimination: Goal: Will not experience complications related to bowel motility Outcome: Progressing Goal: Will not experience complications related to urinary retention Outcome: Progressing   Problem: Pain Managment: Goal: General experience of comfort will improve Outcome: Progressing   Problem: Safety: Goal: Ability to remain free from injury will improve Outcome: Progressing   Problem: Skin  Integrity: Goal: Risk for impaired skin integrity will decrease Outcome: Progressing

## 2021-07-06 NOTE — TOC CAGE-AID Note (Signed)
Transition of Care [facility]) - CAGE-AID Screening   Patient Details  Name: Rachel Nolan MRN: [id number] Date of Birth: 1970/12/28  Transition of Care [facility]) CM/SW Contact:    Milinda Antis, [place] Phone Number: 07/06/2021, 1:34 PM   Clinical Narrative: CSW received consult for SA and homelessness and met with the patient at bedside.  The patient reports that she has been using substances since she was 51 years old and is ready to stop.  Since the patient currently does not have insurance, CSW informed the patient of the [employer] in [place].  The patient informed CSW that she had an appointment with [facility] last week that was missed due to the patient not having transportation.    CSW attempted to contact [place], but received a message stating that "the party is busy and unable to accept calls".    CSW will continue to follow.    CAGE-AID Screening:    Have You Ever Felt You Ought to Cut Down on Your Drinking or Drug Use?: Yes Have People Annoyed You By Critizing Your Drinking Or Drug Use?: No Have You Felt Bad Or Guilty About Your Drinking Or Drug Use?: Yes Have You Ever Had a Drink or Used Drugs First Thing In The Morning to Steady Your Nerves or to Get Rid of a Hangover?: Yes CAGE-AID Score: 3  Substance Abuse Education Offered: Yes

## 2021-07-06 NOTE — Progress Notes (Signed)
[facility]                                                                               Rachel Nolan, is a 51 y.o. female, DOB - Aug 12, 1970, IHK:[id number] Admit date - 07/04/2021    Outpatient Primary MD for the patient is Pcp, No  LOS - 1  days    Brief summary     Rachel Nolan is a pleasant 51 y.o. female with medical history significant for asthma, chronic hypoxic respiratory failure, hypertension, depression, anxiety, PTSD, and polysubstance abuse, now presenting to the emergency department with shortness of breath, cough, and wheezing.   EKG features a sinus rhythm with QTc interval 505 ms.  Chest x-ray negative for acute cardiopulmonary disease.  Chemistry panel unremarkable, COVID and influenza PCR negative, troponin and BNP normal,  Assessment & Plan    Assessment and Plan:  Acute asthma exacerbation in the setting of chronic hypoxic respiratory failure:  - improving , air entry fair, improving wheezing. Pt reports she continues to smoke.  - continue with IV steroids for another 24 hours and transition to oral steroids in am.  Meanwhile continue with  duo nebs.  - she is currently on 2 lit of [place] oxygen . Continue the same.    Prolonged Qtc - continue to monitor. Keep k >4 and magnesium greater than 2.  - recheck EKG.     Hypertension:  Optimal BP parameters.  Continue with norvasc 5 mg daily.    Depression:  Continue with seroquel, and prozac 40 mg daily.    Tobacco abuse:  On nicotine patch.    OSA on CPAP.   Polysubstance abuse:  Pt requesting for resources and rehabilitation resources.  Csw request sent.    Estimated body mass index is 31.17 kg/m as calculated from the following:   Height as of this encounter: 5\' 6"  (1.676 m).   Weight as of this encounter: 87.6 kg.  Code Status: full code.  DVT Prophylaxis:  enoxaparin (LOVENOX) injection 40 mg Start: 07/04/21 2000   Level of Care: Level of care: Med-Surg Family  Communication: none at bedside.  Disposition Plan:     Remains inpatient appropriate:  IV steroids.   Procedures:  None.   Consultants:   None.   Antimicrobials:   Anti-infectives (From admission, onward)    None        Medications  Scheduled Meds:  amLODipine  5 mg Oral Daily   enoxaparin (LOVENOX) injection  40 mg Subcutaneous Q24H   FLUoxetine  40 mg Oral q morning   gabapentin  200 mg Oral BID   montelukast  10 mg Oral QHS   nicotine  21 mg Transdermal Daily   prazosin  1 mg Oral QHS   QUEtiapine  100 mg Oral QHS   sodium chloride flush  3 mL Intravenous Q12H   Continuous Infusions: PRN Meds:.acetaminophen **OR** acetaminophen, albuterol, guaiFENesin-codeine, hydrALAZINE, hydrOXYzine, ipratropium-albuterol, ketorolac, senna-docusate, traZODone    Subjective:   Rachel Nolan was seen and examined today.  Wheezing is improving. No chest pain. On 2 lit of  oxygen.  No nausea, vomiting or abdominal pain.  Pt eval ordered, check ambulating oxygen  levels.   Objective:   Vitals:   07/05/21 2117 07/05/21 2200 07/06/21 0622 07/06/21 0914  BP: (!) 137/96  (!) 146/93 (!) 146/96  Pulse: 71  64 91  Resp: 16 18 16 18   Temp: 98 F (36.7 C)  98 F (36.7 C) 98.1 F (36.7 C)  TempSrc:   Oral Oral  SpO2: 98% 98% 97% 100%  Weight:      Height:        Intake/Output Summary (Last 24 hours) at 07/06/2021 1224 Last data filed at 07/06/2021 0900 Gross per 24 hour  Intake 480 ml  Output 0 ml  Net 480 ml    Filed Weights   07/04/21 1940 07/05/21 1339  Weight: 84.8 kg 87.6 kg     Exam General exam: Appears calm and comfortable  Respiratory system: wheezing bilateral, improving, air entry fair bilateral, on 2 lit of [place] oxygen.  Cardiovascular system: S1 & S2 heard, RRR. No JVD, No pedal edema. Gastrointestinal system: Abdomen is nondistended, soft and nontender. Normal bowel sounds heard. Central nervous system: Alert and oriented. No focal neurological  deficits. Extremities: Symmetric 5 x 5 power. Skin: No rashes, lesions or ulcers Psychiatry:  Mood & affect appropriate.     Data Reviewed:  I have personally reviewed following labs and imaging studies   CBC Lab Results  Component Value Date   WBC 4.9 07/05/2021   RBC 4.02 07/05/2021   HGB 12.7 07/05/2021   HCT 37.7 07/05/2021   MCV 93.8 07/05/2021   MCH 31.6 07/05/2021   PLT 214 07/05/2021   MCHC 33.7 07/05/2021   RDW 14.9 07/05/2021   LYMPHSABS 1.6 07/04/2021   MONOABS 0.4 07/04/2021   EOSABS 0.2 07/04/2021   BASOSABS 0.0 07/04/2021     Last metabolic panel Lab Results  Component Value Date   NA 141 07/05/2021   K 3.9 07/05/2021   CL 104 07/05/2021   CO2 24 07/05/2021   BUN 11 07/05/2021   CREATININE 0.78 07/05/2021   GLUCOSE 217 (H) 07/05/2021   GFRNONAA >60 07/05/2021   GFRAA >60 08/28/2019   CALCIUM 9.3 07/05/2021   PHOS 3.5 05/12/2021   PROT 6.5 05/19/2021   ALBUMIN 3.3 (L) 05/19/2021   BILITOT 0.5 05/19/2021   ALKPHOS 54 05/19/2021   AST 16 05/19/2021   ALT 12 05/19/2021   ANIONGAP 13 07/05/2021    CBG (last 3)  No results for input(s): "GLUCAP" in the last 72 hours.    Coagulation Profile: No results for input(s): "INR", "PROTIME" in the last 168 hours.   Radiology Studies: DG Chest Portable 1 View  Result Date: 07/04/2021 CLINICAL DATA:  Shortness of breath EXAM: PORTABLE CHEST 1 VIEW COMPARISON:  05/18/2021 FINDINGS: Cardiac size is within normal limits. There are no signs of pulmonary edema or focal pulmonary consolidation. There is no pleural effusion or pneumothorax. Harrington rod is noted in the thoracolumbar region. IMPRESSION: No active disease. Electronically Signed   By: 07/18/2021 M.D.   On: 07/04/2021 17:04       07/06/2021 M.D. [facility] 07/06/2021, 12:24 PM  Available via Epic secure chat 7am-7pm After 7 pm, please refer to night coverage provider listed on amion.

## 2021-07-07 MED ORDER — PREDNISONE 50 MG PO TABS
60.0000 mg | ORAL_TABLET | Freq: Every day | ORAL | Status: AC
  Administered 2021-07-07 – 2021-07-09 (×3): 60 mg via ORAL
  Filled 2021-07-07 (×3): qty 1

## 2021-07-07 NOTE — Progress Notes (Signed)
PT Cancellation Note  Patient Details Name: Rachel Nolan MRN: [id number] DOB: 05/18/1970   Cancelled Treatment:    Reason Eval/Treat Not Completed: Fatigue/lethargy limiting ability to participate  Patient sleeping soundly. Awakened x 3 and pt immediately falling back asleep. Will try again later today.    Jerolyn Center, PT Acute Rehabilitation Services  Office [phone]   Zena Amos 07/07/2021, 1:21 PM

## 2021-07-07 NOTE — Progress Notes (Signed)
[facility]                                                                               Rachel Nolan, is a 51 y.o. female, DOB - [birth year], NAT:[id number] Admit date - 07/04/2021    Outpatient Primary MD for the patient is Pcp, No  LOS - 2  days    Brief summary     Rachel Nolan is a pleasant 51 y.o. female with medical history significant for asthma, chronic hypoxic respiratory failure, hypertension, depression, anxiety, PTSD, and polysubstance abuse, now presenting to the emergency department with shortness of breath, cough, and wheezing.   EKG features a sinus rhythm with QTc interval 505 ms.  Chest x-ray negative for acute cardiopulmonary disease.  Chemistry panel unremarkable, COVID and influenza PCR negative, troponin and BNP normal,  Assessment & Plan    Assessment and Plan:  Acute asthma exacerbation in the setting of chronic hypoxic respiratory failure:  - improving , air entry fair, improving wheezing. Pt reports she continues to smoke.  - continue with IV steroids for another 24 hours and transition to oral steroids in am.  Meanwhile continue with  duo nebs.  - she is currently on 2 lit of [place] oxygen . Continue the same.  Check ambulating oxygen levels with PT.  Transitioned to oral prednisone today. Continue to monitor.  Will need outpatient pulmonology follow up .    Prolonged Qtc - continue to monitor. Keep k >4 and magnesium greater than 2.  - Qtc IMPROVED.     Hypertension:  Well controlled BP parameters.  Continue with norvasc 5 mg daily.    Depression:  Continue with seroquel, and prozac 40 mg daily.    Tobacco abuse:  On nicotine patch.    OSA on CPAP.   Polysubstance abuse:  Pt requesting for resources and rehabilitation resources.  CSW on board.    Estimated body mass index is 31.17 kg/m as calculated from the following:   Height as of this encounter: 5\' 6"  (1.676 m).   Weight as of this encounter: 87.6  kg.  Code Status: full code.  DVT Prophylaxis:  enoxaparin (LOVENOX) injection 40 mg Start: 07/04/21 2000   Level of Care: Level of care: Med-Surg Family Communication: none at bedside.  Disposition Plan:     Remains inpatient appropriate:  discharge home pending therapy evaluation in the next 24 hours.   Procedures:  None.   Consultants:   None.   Antimicrobials:   Anti-infectives (From admission, onward)    None        Medications  Scheduled Meds:  amLODipine  5 mg Oral Daily   enoxaparin (LOVENOX) injection  40 mg Subcutaneous Q24H   FLUoxetine  40 mg Oral q morning   gabapentin  200 mg Oral BID   montelukast  10 mg Oral QHS   nicotine  21 mg Transdermal Daily   prazosin  1 mg Oral QHS   predniSONE  60 mg Oral Daily   QUEtiapine  100 mg Oral QHS   sodium chloride flush  3 mL Intravenous Q12H   Continuous Infusions: PRN Meds:.acetaminophen **OR** acetaminophen, albuterol, guaiFENesin-codeine, hydrALAZINE, hydrOXYzine, ipratropium-albuterol, ketorolac, senna-docusate,  traZODone    Subjective:   Rachel Nolan was seen and examined today.   Reports feeling tired, no chest pain , some sob on ambulation.    Objective:   Vitals:   07/06/21 1645 07/06/21 2322 07/07/21 0637 07/07/21 0900  BP: (!) 128/91 130/79 118/85 121/76  Pulse: 67 97 80 78  Resp: 18 20 18 18   Temp: 97.9 F (36.6 C) 98.5 F (36.9 C) 98 F (36.7 C) 98.2 F (36.8 C)  TempSrc: Oral Oral Oral Oral  SpO2: 99% 99% 99% 98%  Weight:      Height:        Intake/Output Summary (Last 24 hours) at 07/07/2021 1419 Last data filed at 07/07/2021 0900 Gross per 24 hour  Intake 820 ml  Output 0 ml  Net 820 ml    Filed Weights   07/04/21 1940 07/05/21 1339  Weight: 84.8 kg 87.6 kg     Exam General exam: Appears calm and comfortable  Respiratory system: Bilateral expiratory wheezing has improved, air entry fair. On 2 lit of [place] oxygen.  Cardiovascular system: S1 & S2 heard, RRR. No JVD,   No pedal edema. Gastrointestinal system: Abdomen is nondistended, soft and nontender.  Normal bowel sounds heard. Central nervous system: Alert and oriented. No focal neurological deficits. Extremities: Symmetric 5 x 5 power. Skin: No rashes, lesions or ulcers Psychiatry:  Mood & affect appropriate.      Data Reviewed:  I have personally reviewed following labs and imaging studies   CBC Lab Results  Component Value Date   WBC 4.9 07/05/2021   RBC 4.02 07/05/2021   HGB 12.7 07/05/2021   HCT 37.7 07/05/2021   MCV 93.8 07/05/2021   MCH 31.6 07/05/2021   PLT 214 07/05/2021   MCHC 33.7 07/05/2021   RDW 14.9 07/05/2021   LYMPHSABS 1.6 07/04/2021   MONOABS 0.4 07/04/2021   EOSABS 0.2 07/04/2021   BASOSABS 0.0 07/04/2021     Last metabolic panel Lab Results  Component Value Date   NA 141 07/05/2021   K 3.9 07/05/2021   CL 104 07/05/2021   CO2 24 07/05/2021   BUN 11 07/05/2021   CREATININE 0.78 07/05/2021   GLUCOSE 217 (H) 07/05/2021   GFRNONAA >60 07/05/2021   GFRAA >60 08/28/2019   CALCIUM 9.3 07/05/2021   PHOS 3.5 05/12/2021   PROT 6.5 05/19/2021   ALBUMIN 3.3 (L) 05/19/2021   BILITOT 0.5 05/19/2021   ALKPHOS 54 05/19/2021   AST 16 05/19/2021   ALT 12 05/19/2021   ANIONGAP 13 07/05/2021    CBG (last 3)  No results for input(s): "GLUCAP" in the last 72 hours.    Coagulation Profile: No results for input(s): "INR", "PROTIME" in the last 168 hours.   Radiology Studies: No results found.     07/07/2021 M.D. [facility] 07/07/2021, 2:19 PM  Available via Epic secure chat 7am-7pm After 7 pm, please refer to night coverage provider listed on amion.

## 2021-07-07 NOTE — Evaluation (Signed)
Physical Therapy Evaluation and Discharge Patient Details Name: Rachel Nolan MRN: [id number] DOB: July 02, 1970 Today's Date: 07/07/2021  History of Present Illness  51 y.o. female with medical history significant of hypertension, asthma/COPD, chronic respiratory failure on 2 L, anxiety, depression, and polysubstance abuse (including cocaine, marijuana, tobacco, and remote history alcohol abuse) presented hospital with shortness of breath. acute on chronic asthma exacerbation secondary to polysubstance abuse  Clinical Impression   Patient evaluated by Physical Therapy with no further acute PT needs identified. Patient modified independent walking with RW and portable oxygen. She had dyspnea 3/4, however reports this is baseline for her and sats =96% throughout session. Patient can be well-served by the Mobility Team.  PT is signing off. Thank you for this referral.        Recommendations for follow up therapy are one component of a multi-disciplinary discharge planning process, led by the attending physician.  Recommendations may be updated based on patient status, additional functional criteria and insurance authorization.  Follow Up Recommendations No PT follow up    Assistance Recommended at Discharge None  Patient can return home with the following       Equipment Recommendations Rolling walker (2 wheels) (she does not know where her's is)  Recommendations for Other Services  Other (comment) (Mobility team)    Functional Status Assessment Patient has not had a recent decline in their functional status     Precautions / Restrictions Precautions Precautions: None Restrictions Weight Bearing Restrictions: No      Mobility  Bed Mobility Overal bed mobility: Independent                  Transfers Overall transfer level: Modified independent Equipment used: Rolling walker (2 wheels)                    Ambulation/Gait Ambulation/Gait assistance: Modified  independent (Device/Increase time) Gait Distance (Feet): 180 Feet (30) Assistive device: Rolling walker (2 wheels) Gait Pattern/deviations: Step-through pattern, Trunk flexed   Gait velocity interpretation: >2.62 ft/sec, indicative of community ambulatory   General Gait Details: pt initially walked with RW and PT pushing oxygen tank; to bathroom pt pushed oxygen tank herself  Stairs            Wheelchair Mobility    Modified Rankin (Stroke Patients Only)       Balance Overall balance assessment: Modified Independent                                           Pertinent Vitals/Pain Pain Assessment Pain Assessment: No/denies pain    Home Living Family/patient expects to be discharged to:: Shelter/Homeless                   Additional Comments: Pt was previously living with sister, but now homeless and looking for a shelter    Prior Function Prior Level of Function : Independent/Modified Independent             Mobility Comments: usually uses RW at all times (not sure where her RW is at this time)       Hand Dominance   Dominant Hand: Right    Extremity/Trunk Assessment   Upper Extremity Assessment Upper Extremity Assessment: Overall WFL for tasks assessed    Lower Extremity Assessment Lower Extremity Assessment: Overall WFL for tasks assessed    Cervical / Trunk Assessment Cervical /  Trunk Assessment: Other exceptions Cervical / Trunk Exceptions: h/o scoliosis and surgery  Communication   Communication: No difficulties  Cognition Arousal/Alertness: Awake/alert Behavior During Therapy: WFL for tasks assessed/performed Overall Cognitive Status: Within Functional Limits for tasks assessed                                          General Comments General comments (skin integrity, edema, etc.): sats 96% throughout session  on 2L; +3/4 dyspnea with walking    Exercises     Assessment/Plan    PT  Assessment Patient does not need any further PT services  PT Problem List         PT Treatment Interventions      PT Goals (Current goals can be found in the Care Plan section)  Acute Rehab PT Goals Patient Stated Goal: none stated PT Goal Formulation: All assessment and education complete, DC therapy    Frequency       Co-evaluation               AM-PAC PT "6 Clicks" Mobility  Outcome Measure Help needed turning from your back to your side while in a flat bed without using bedrails?: None Help needed moving from lying on your back to sitting on the side of a flat bed without using bedrails?: None Help needed moving to and from a bed to a chair (including a wheelchair)?: None Help needed standing up from a chair using your arms (e.g., wheelchair or bedside chair)?: None Help needed to walk in hospital room?: None Help needed climbing 3-5 steps with a railing? : None 6 Click Score: 24    End of Session Equipment Utilized During Treatment: Oxygen Activity Tolerance: Treatment limited secondary to medical complications (Comment) (distance limited by dyspnea) Patient left: in bed;with call bell/phone within reach   PT Visit Diagnosis: Difficulty in walking, not elsewhere classified (R26.2)    Time: 4098-1191 PT Time Calculation (min) (ACUTE ONLY): 15 min   Charges:   PT Evaluation $PT Eval Low Complexity: 1 Low           Jerolyn Center, PT Acute Rehabilitation Services  Office [phone]   Zena Amos 07/07/2021, 3:18 PM

## 2021-07-07 NOTE — TOC Progression Note (Signed)
Transition of Care [facility]) - Initial/Assessment Note    Patient Details  Name: Rachel Nolan MRN: [id number] Date of Birth: October 02, 1970  [facility]) CM/SW Contact:    Ralene Bathe, LCSWA Phone Number: 07/07/2021, 11:55 AM  Clinical Narrative:                 CSW contacted [facility].  The facility reports that they cannot hold beds, but that patient or CSW can call back on the day that the patient is medically ready to discharge to inquire about bed availability.    TOC will continue to follow.         Patient Goals and CMS Choice        Expected Discharge Plan and Services                                                Prior Living Arrangements/Services                       Activities of Daily Living Home Assistive Devices/Equipment: Oxygen, Walker (specify type) ADL Screening (condition at time of admission) Patient's cognitive ability adequate to safely complete daily activities?: Yes Is the patient deaf or have difficulty hearing?: Yes Does the patient have difficulty seeing, even when wearing glasses/contacts?: Yes Does the patient have difficulty concentrating, remembering, or making decisions?: No Patient able to express need for assistance with ADLs?: Yes Does the patient have difficulty dressing or bathing?: No Independently performs ADLs?: Yes (appropriate for developmental age) Does the patient have difficulty walking or climbing stairs?: Yes Weakness of Legs: Both Weakness of Arms/Hands: None  Permission Sought/Granted                  Emotional Assessment              Admission diagnosis:  COPD exacerbation (HCC) [J44.1] Acute asthma exacerbation [J45.901] Acute on chronic respiratory failure with hypoxia (HCC) [J96.21] Polysubstance use disorder [F19.90] Patient Active Problem List   Diagnosis Date Noted   Polysubstance use disorder    Asthma exacerbation 05/17/2021   OSA (obstructive sleep  apnea) 05/17/2021   Marijuana user 05/17/2021   Chronic respiratory failure with hypoxia (HCC) 04/20/2021   History of substance abuse (HCC) 04/20/2021   Acute asthma exacerbation 02/12/2021   Nicotine dependence, cigarettes, uncomplicated 02/12/2021   Prolonged Q-T interval on ECG 09/26/2020   Acute bronchitis 09/24/2020   Mild asthma exacerbation 09/24/2020   Tobacco abuse 09/24/2020   Abnormal weight loss 09/24/2020   Tachycardia 09/23/2020   Asthma 09/23/2020   Alcohol abuse 09/21/2020   Homeless 08/22/2020   Cocaine abuse (HCC) 07/09/2020   Opioid use disorder, moderate, dependence (HCC) 05/16/2020   Adjustment disorder with depressed mood 04/24/2020   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   Essential hypertension 03/26/2020   Neuropathic pain 03/26/2020   Severe recurrent major depression with psychotic features (HCC) 03/25/2020   Major depressive disorder with psychotic features (HCC) 01/24/2020   Stimulant use disorder 01/24/2020   Alcohol use disorder, severe, dependence (HCC) 12/06/2019   Alcohol withdrawal (HCC) 11/28/2019   Suicidal ideation 11/28/2019   Cocaine dependence (HCC) 03/05/2019   Major depressive disorder, recurrent episode, severe (HCC) 03/02/2019   Post traumatic stress disorder (PTSD) 03/02/2019   Anxiety and depression 03/02/2019   Substance induced mood disorder (HCC) 03/02/2019  MDD (major depressive disorder) 03/01/2019   PCP:  Pcp, No Pharmacy:   [facility] [address], [address] [place] [address] Phone: [phone] Fax: [phone]     Social Determinants of Health (SDOH) Interventions    Readmission Risk Interventions    05/19/2021   10:10 AM 05/13/2021   11:33 AM 12/04/2019   11:07 AM  Readmission Risk Prevention Plan  Transportation Screening Complete Complete Complete  PCP or Specialist Appt within 5-7 Days   Complete  Home Care Screening   Complete   Medication Review (RN CM)   Complete  Medication Review (RN Care Manager) Complete Complete   PCP or Specialist appointment within 3-5 days of discharge  Not Complete   PCP/Specialist Appt Not Complete comments first apt is 5/24 apt wll be made for CHW new pcp apt   HRI or Home Care Consult Complete Complete   SW Recovery Care/Counseling Consult Complete Complete   Palliative Care Screening Not Applicable Not Applicable   Skilled Nursing Facility Not Applicable Not Applicable

## 2021-07-07 NOTE — Progress Notes (Signed)
CPAP setup at patients bedside within reach.  Patient states she will place on self when ready.  RT available if needed.

## 2021-07-08 ENCOUNTER — Other Ambulatory Visit (HOSPITAL_COMMUNITY): Payer: Self-pay

## 2021-07-08 DIAGNOSIS — J4541 Moderate persistent asthma with (acute) exacerbation: Secondary | ICD-10-CM

## 2021-07-08 MED ORDER — SENNOSIDES-DOCUSATE SODIUM 8.6-50 MG PO TABS
1.0000 | ORAL_TABLET | Freq: Every evening | ORAL | 0 refills | Status: DC | PRN
  Filled 2021-07-08: qty 30, 30d supply, fill #0

## 2021-07-08 MED ORDER — NICOTINE 21 MG/24HR TD PT24
21.0000 mg | MEDICATED_PATCH | Freq: Every day | TRANSDERMAL | 0 refills | Status: DC
  Filled 2021-07-08: qty 28, 28d supply, fill #0

## 2021-07-08 MED ORDER — PREDNISONE 10 MG PO TABS
ORAL_TABLET | ORAL | 0 refills | Status: DC
  Filled 2021-07-08 (×2): qty 36, 13d supply, fill #0

## 2021-07-08 MED ORDER — GABAPENTIN 100 MG PO CAPS: 200.0000 mg | ORAL_CAPSULE | Freq: Three times a day (TID) | ORAL | 1 refills | Status: DC

## 2021-07-08 MED ORDER — HYDROXYZINE HCL 25 MG PO TABS
50.0000 mg | ORAL_TABLET | Freq: Three times a day (TID) | ORAL | 1 refills | Status: DC | PRN
  Filled 2021-07-08: qty 120, 20d supply, fill #0

## 2021-07-08 MED ORDER — FLUOXETINE HCL 40 MG PO CAPS
40.0000 mg | ORAL_CAPSULE | Freq: Every morning | ORAL | 1 refills | Status: DC
  Filled 2021-07-08: qty 30, 30d supply, fill #0

## 2021-07-08 MED ORDER — MONTELUKAST SODIUM 10 MG PO TABS
10.0000 mg | ORAL_TABLET | Freq: Every day | ORAL | 1 refills | Status: DC
  Filled 2021-07-08: qty 30, 30d supply, fill #0

## 2021-07-08 MED ORDER — AMLODIPINE BESYLATE 5 MG PO TABS
5.0000 mg | ORAL_TABLET | Freq: Every day | ORAL | 2 refills | Status: DC
  Filled 2021-07-08: qty 30, 30d supply, fill #0

## 2021-07-08 MED ORDER — IPRATROPIUM-ALBUTEROL 0.5-2.5 (3) MG/3ML IN SOLN
3.0000 mL | Freq: Four times a day (QID) | RESPIRATORY_TRACT | 4 refills | Status: DC | PRN
  Filled 2021-07-08: qty 360, 30d supply, fill #0

## 2021-07-08 MED ORDER — PRAZOSIN HCL 1 MG PO CAPS
1.0000 mg | ORAL_CAPSULE | Freq: Every day | ORAL | 1 refills | Status: DC
  Filled 2021-07-08: qty 30, 30d supply, fill #0

## 2021-07-08 NOTE — Progress Notes (Signed)
[facility]                                                                               Rachel Nolan, is a 51 y.o. female, DOB - [birth year], URK:[id number] Admit date - 07/04/2021    Outpatient Primary MD for the patient is Pcp, No  LOS - 3  days    Brief summary     Rachel Nolan is a pleasant 51 y.o. female with medical history significant for asthma, chronic hypoxic respiratory failure, hypertension, depression, anxiety, PTSD, and polysubstance abuse, now presenting to the emergency department with shortness of breath, cough, and wheezing.   EKG features a sinus rhythm with QTc interval 505 ms.  Chest x-ray negative for acute cardiopulmonary disease.  Chemistry panel unremarkable, COVID and influenza PCR negative, troponin and BNP normal,  Assessment & Plan    Assessment and Plan:  Acute asthma exacerbation in the setting of chronic hypoxic respiratory failure:  - improving , air entry fair, improving wheezing. Pt reports she continues to smoke.  Transitioned to prednisone and she will be on a prednisone taper ondischarge.  Meanwhile continue with  duo nebs.  - she is currently on 2 lit of [place] oxygen . Continue the same.  Check ambulating oxygen levels with PT.  Will need outpatient pulmonology follow up .    Prolonged Qtc - continue to monitor. Keep k >4 and magnesium greater than 2.  - Qtc IMPROVED.     Hypertension:  Well controlled BP parameters.  Continue with norvasc 5 mg daily.    Depression:  Continue with seroquel, and prozac 40 mg daily.    Tobacco abuse:  On nicotine patch.    OSA on CPAP.   Polysubstance abuse:  Pt requesting for resources and rehabilitation resources.  CSW on board.    Estimated body mass index is 31.17 kg/m as calculated from the following:   Height as of this encounter: 5\' 6"  (1.676 m).   Weight as of this encounter: 87.6 kg.  Code Status: full code.  DVT Prophylaxis:  enoxaparin (LOVENOX) injection  40 mg Start: 07/04/21 2000   Level of Care: Level of care: Med-Surg Family Communication: none at bedside.  Disposition Plan:     Remains inpatient appropriate:  pending safe disposition.    Procedures:  None.   Consultants:   None.   Antimicrobials:   Anti-infectives (From admission, onward)    None        Medications  Scheduled Meds:  amLODipine  5 mg Oral Daily   enoxaparin (LOVENOX) injection  40 mg Subcutaneous Q24H   FLUoxetine  40 mg Oral q morning   gabapentin  200 mg Oral BID   montelukast  10 mg Oral QHS   nicotine  21 mg Transdermal Daily   prazosin  1 mg Oral QHS   predniSONE  60 mg Oral Daily   QUEtiapine  100 mg Oral QHS   sodium chloride flush  3 mL Intravenous Q12H   Continuous Infusions: PRN Meds:.acetaminophen **OR** acetaminophen, albuterol, guaiFENesin-codeine, hydrALAZINE, hydrOXYzine, ipratropium-albuterol, ketorolac, senna-docusate, traZODone    Subjective:   Rachel Nolan was seen and examined today.   Reports feeling tired.  Sob has improved.   Objective:   Vitals:   07/07/21 2043 07/08/21 0443 07/08/21 0916 07/08/21 1149  BP: (!) 132/97 129/84 (!) 134/91 (!) 147/99  Pulse: (!) 104 89 (!) 101 88  Resp: 20 18 17 16   Temp: 98.5 F (36.9 C) 98.2 F (36.8 C) 98.5 F (36.9 C) 98.4 F (36.9 C)  TempSrc: Oral  Oral Oral  SpO2:  97% 95% 99%  Weight:      Height:        Intake/Output Summary (Last 24 hours) at 07/08/2021 1552 Last data filed at 07/08/2021 1200 Gross per 24 hour  Intake 840 ml  Output 0 ml  Net 840 ml    Filed Weights   07/04/21 1940 07/05/21 1339  Weight: 84.8 kg 87.6 kg     Exam General exam: Appears calm and comfortable  Respiratory system: air entry fair, scattered wheezing.  Cardiovascular system: S1 & S2 heard, RRR. No JVD,  No pedal edema. Gastrointestinal system: Abdomen is nondistended, soft and nontender. Normal bowel sounds heard. Central nervous system: Alert and oriented. No focal  neurological deficits. Extremities: Symmetric 5 x 5 power. Skin: No rashes, lesions or ulcers Psychiatry: Mood & affect appropriate.      Data Reviewed:  I have personally reviewed following labs and imaging studies   CBC Lab Results  Component Value Date   WBC 4.9 07/05/2021   RBC 4.02 07/05/2021   HGB 12.7 07/05/2021   HCT 37.7 07/05/2021   MCV 93.8 07/05/2021   MCH 31.6 07/05/2021   PLT 214 07/05/2021   MCHC 33.7 07/05/2021   RDW 14.9 07/05/2021   LYMPHSABS 1.6 07/04/2021   MONOABS 0.4 07/04/2021   EOSABS 0.2 07/04/2021   BASOSABS 0.0 07/04/2021     Last metabolic panel Lab Results  Component Value Date   NA 141 07/05/2021   K 3.9 07/05/2021   CL 104 07/05/2021   CO2 24 07/05/2021   BUN 11 07/05/2021   CREATININE 0.78 07/05/2021   GLUCOSE 217 (H) 07/05/2021   GFRNONAA >60 07/05/2021   GFRAA >60 08/28/2019   CALCIUM 9.3 07/05/2021   PHOS 3.5 05/12/2021   PROT 6.5 05/19/2021   ALBUMIN 3.3 (L) 05/19/2021   BILITOT 0.5 05/19/2021   ALKPHOS 54 05/19/2021   AST 16 05/19/2021   ALT 12 05/19/2021   ANIONGAP 13 07/05/2021    CBG (last 3)  No results for input(s): "GLUCAP" in the last 72 hours.    Coagulation Profile: No results for input(s): "INR", "PROTIME" in the last 168 hours.   Radiology Studies: No results found.     07/07/2021 M.D. [facility] 07/08/2021, 3:52 PM  Available via Epic secure chat 7am-7pm After 7 pm, please refer to night coverage provider listed on amion.

## 2021-07-08 NOTE — Progress Notes (Signed)
  Mobility Specialist Criteria Algorithm Info.   07/08/21 1153  Mobility  Activity Contraindicated/medical hold   Per RN, pt having difficulty breathing and awaiting respiratory. Will f/u when pt medically appropriate for participation.  07/08/2021 11:53 AM  Rachel Nolan, CMS, BS EXP Acute Rehabilitation Services  [phone] Office: [phone]

## 2021-07-08 NOTE — Progress Notes (Signed)
CPAP in room, pt stated that she will place herself on CPAP when ready to sleep.

## 2021-07-08 NOTE — TOC Progression Note (Signed)
Transition of Care [facility]) - Progression Note    Patient Details  Name: Rachel Nolan MRN: [id number] Date of Birth: February 14, 1970  [facility] of Care [facility]) CM/SW Contact  Tom-Johnson, Hershal Coria, RN Phone Number: 07/08/2021, 12:15 PM  Clinical Narrative:     MATCH done for prescription assistance. TOC will continue to follow with needs.        Expected Discharge Plan and Services           Expected Discharge Date: 07/08/21                                     Social Determinants of Health (SDOH) Interventions    Readmission Risk Interventions    05/19/2021   10:10 AM 05/13/2021   11:33 AM 12/04/2019   11:07 AM  Readmission Risk Prevention Plan  Transportation Screening Complete Complete Complete  PCP or Specialist Appt within 5-7 Days   Complete  Home Care Screening   Complete  Medication Review (RN CM)   Complete  Medication Review (RN Care Manager) Complete Complete   PCP or Specialist appointment within 3-5 days of discharge  Not Complete   PCP/Specialist Appt Not Complete comments first apt is 5/24 apt wll be made for CHW new pcp apt   HRI or Home Care Consult Complete Complete   SW Recovery Care/Counseling Consult Complete Complete   Palliative Care Screening Not Applicable Not Applicable   Skilled Nursing Facility Not Applicable Not Applicable

## 2021-07-08 NOTE — TOC Progression Note (Signed)
Transition of Care [facility]) - Initial/Assessment Note    Patient Details  Name: Rachel Nolan MRN: [id number] Date of Birth: 10-21-1970  [facility]) CM/SW Contact:    Milinda Antis, LCSWA Phone Number: 07/08/2021, 4:44 PM  Clinical Narrative:                 CSW contacted [facility].  The facility does not have a bed available at this time.  CSW met with the patient.  The patient reported that she lives with a friend.  Upon further questioning, CSW was informed that the patient sleeps in a car outside of the friend's home.  The patient currently does not have any income or health insurance.  Patient reports that at one time she was donating plasma to receive money to pay for a hotel room.     CSW contacted Jenny Reichmann the patients ACT team case manager with [facility] [phone]).  CSW was informed that the agency does not have any place to take the patient.  MD notified of the above information.  CSW will continue to follow.        Patient Goals and CMS Choice        Expected Discharge Plan and Services           Expected Discharge Date: 07/08/21                                    Prior Living Arrangements/Services                       Activities of Daily Living Home Assistive Devices/Equipment: Oxygen, Walker (specify type) ADL Screening (condition at time of admission) Patient's cognitive ability adequate to safely complete daily activities?: Yes Is the patient deaf or have difficulty hearing?: Yes Does the patient have difficulty seeing, even when wearing glasses/contacts?: Yes Does the patient have difficulty concentrating, remembering, or making decisions?: No Patient able to express need for assistance with ADLs?: Yes Does the patient have difficulty dressing or bathing?: No Independently performs ADLs?: Yes (appropriate for developmental age) Does the patient have difficulty walking or climbing stairs?: Yes Weakness of  Legs: Both Weakness of Arms/Hands: None  Permission Sought/Granted                  Emotional Assessment              Admission diagnosis:  COPD exacerbation ([place]) [J44.1] Acute asthma exacerbation [J45.901] Acute on chronic respiratory failure with hypoxia ([place]) [J96.21] Polysubstance use disorder [F19.90] Patient Active Problem List   Diagnosis Date Noted   Polysubstance use disorder    Asthma exacerbation 05/17/2021   OSA (obstructive sleep apnea) 05/17/2021   Marijuana user 05/17/2021   Chronic respiratory failure with hypoxia ([place]) 04/20/2021   History of substance abuse ([place]) 04/20/2021   Acute asthma exacerbation 02/12/2021   Nicotine dependence, cigarettes, uncomplicated 89/37/3428   Prolonged Q-T interval on ECG 09/26/2020   Acute bronchitis 09/24/2020   Mild asthma exacerbation 09/24/2020   Tobacco abuse 09/24/2020   Abnormal weight loss 09/24/2020   Tachycardia 09/23/2020   Asthma 09/23/2020   Alcohol abuse 09/21/2020   Homeless 08/22/2020   Cocaine abuse ([place]) 07/09/2020   Opioid use disorder, moderate, dependence ([place]) 05/16/2020   Adjustment disorder with depressed mood 04/24/2020   Cocaine-induced mood disorder ([place]) 04/22/2020   Essential hypertension 03/26/2020   Neuropathic pain  03/26/2020   Severe recurrent major depression with psychotic features ([place]) 03/25/2020   Major depressive disorder with psychotic features ([place]) 01/24/2020   Stimulant use disorder 01/24/2020   Alcohol use disorder, severe, dependence ([place]) 12/06/2019   Alcohol withdrawal ([place]) 11/28/2019   Suicidal ideation 11/28/2019   Cocaine dependence ([place]) 03/05/2019   Major depressive disorder, recurrent episode, severe ([place]) 03/02/2019   Post traumatic stress disorder (PTSD) 03/02/2019   Anxiety and depression 03/02/2019   Substance induced mood disorder ([place]) 03/02/2019   MDD (major depressive disorder) 03/01/2019   PCP:  Pcp, No Pharmacy:   [facility], [place] [address] Phone: [phone] Fax: [phone]     Social Determinants of Health (SDOH) Interventions    Readmission Risk Interventions    05/19/2021   10:10 AM 05/13/2021   11:33 AM 12/04/2019   11:07 AM  Readmission Risk Prevention Plan  Transportation Screening Complete Complete Complete  PCP or Specialist Appt within 5-7 Days   Complete  Home Care Screening   Complete  Medication Review (RN CM)   Complete  Medication Review (RN Care Manager) Complete Complete   PCP or Specialist appointment within 3-5 days of discharge  Not Complete   PCP/Specialist Appt Not Complete comments first apt is 5/24 apt wll be made for CHW new pcp apt   [place] or [place] Complete Complete   SW Recovery Care/Counseling Consult Complete Complete   Palliative Care Screening Not Applicable Not [place] Not Applicable Not Applicable

## 2021-07-08 NOTE — Progress Notes (Signed)
Patient complained of shortness of breath and wanted a breathing treatment. Her oxygen saturation was 100% according to pulse oximetry.  Respiratory therapy gve the patient a breathing treatment and started her on 2 liters nasal cannula. She refused to walk with PT/ OT. Later in the shift the patient completed her own bath at the sink without her 2 liters of nasal cannula, and was walking around in the room without the nasal cannula without distress.

## 2021-07-09 ENCOUNTER — Other Ambulatory Visit (HOSPITAL_COMMUNITY): Payer: Self-pay

## 2021-07-09 MED ORDER — MOMETASONE FURO-FORMOTEROL FUM 200-5 MCG/ACT IN AERO
2.0000 | INHALATION_SPRAY | Freq: Two times a day (BID) | RESPIRATORY_TRACT | Status: DC
  Administered 2021-07-09: 2 via RESPIRATORY_TRACT
  Filled 2021-07-09: qty 8.8

## 2021-07-09 MED ORDER — MOMETASONE FURO-FORMOTEROL FUM 200-5 MCG/ACT IN AERO
2.0000 | INHALATION_SPRAY | Freq: Two times a day (BID) | RESPIRATORY_TRACT | 2 refills | Status: DC
  Filled 2021-07-09: qty 13, 22d supply, fill #0

## 2021-07-09 MED ORDER — GABAPENTIN 100 MG PO CAPS
200.0000 mg | ORAL_CAPSULE | Freq: Three times a day (TID) | ORAL | 1 refills | Status: DC
  Filled 2021-07-09: qty 180, 30d supply, fill #0

## 2021-07-09 NOTE — Progress Notes (Signed)
AVS given and reviewed with pt. Medications delivered to bedside by [facility] pharmacy and discussed with pt. All questions answered to satisfaction. Pt verbalized understanding of information given. Pt to be escorted off the unit with all belongings via wheelchair by staff member.
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
## 2021-07-09 NOTE — TOC Progression Note (Signed)
Transition of Care [facility]) - Initial/Assessment Note    Patient Details  Name: Rachel Nolan MRN: [id number] Date of Birth: 05/10/1970  Transition of Care [facility]) CM/SW Contact:    Ralene Bathe, LCSWA Phone Number: 07/09/2021, 10:26 AM  Clinical Narrative:                 CSW contacted [facility].  The facility has  a bed available, but it is on the 2nd floor and a top bunk.  The patient reports that she is unable to climb to get to the bed.    CSW spoke with the patient who reports that she heard from a facility in [facility] that ask for her to call them back at 11:30am as they may have a bed available.  The patient has already completed the intake.        Patient Goals and CMS Choice        Expected Discharge Plan and Services           Expected Discharge Date: 07/08/21                                    Prior Living Arrangements/Services                       Activities of Daily Living Home Assistive Devices/Equipment: Oxygen, Walker (specify type) ADL Screening (condition at time of admission) Patient's cognitive ability adequate to safely complete daily activities?: Yes Is the patient deaf or have difficulty hearing?: Yes Does the patient have difficulty seeing, even when wearing glasses/contacts?: Yes Does the patient have difficulty concentrating, remembering, or making decisions?: No Patient able to express need for assistance with ADLs?: Yes Does the patient have difficulty dressing or bathing?: No Independently performs ADLs?: Yes (appropriate for developmental age) Does the patient have difficulty walking or climbing stairs?: Yes Weakness of Legs: Both Weakness of Arms/Hands: None  Permission Sought/Granted                  Emotional Assessment              Admission diagnosis:  COPD exacerbation (HCC) [J44.1] Acute asthma exacerbation [J45.901] Acute on chronic respiratory failure with hypoxia (HCC)  [J96.21] Polysubstance use disorder [F19.90] Patient Active Problem List   Diagnosis Date Noted   Polysubstance use disorder    Asthma exacerbation 05/17/2021   OSA (obstructive sleep apnea) 05/17/2021   Marijuana user 05/17/2021   Chronic respiratory failure with hypoxia (HCC) 04/20/2021   History of substance abuse (HCC) 04/20/2021   Acute asthma exacerbation 02/12/2021   Nicotine dependence, cigarettes, uncomplicated 02/12/2021   Prolonged Q-T interval on ECG 09/26/2020   Acute bronchitis 09/24/2020   Mild asthma exacerbation 09/24/2020   Tobacco abuse 09/24/2020   Abnormal weight loss 09/24/2020   Tachycardia 09/23/2020   Asthma 09/23/2020   Alcohol abuse 09/21/2020   Homeless 08/22/2020   Cocaine abuse (HCC) 07/09/2020   Opioid use disorder, moderate, dependence (HCC) 05/16/2020   Adjustment disorder with depressed mood 04/24/2020   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   Essential hypertension 03/26/2020   Neuropathic pain 03/26/2020   Severe recurrent major depression with psychotic features (HCC) 03/25/2020   Major depressive disorder with psychotic features (HCC) 01/24/2020   Stimulant use disorder 01/24/2020   Alcohol use disorder, severe, dependence (HCC) 12/06/2019   Alcohol withdrawal (HCC) 11/28/2019   Suicidal ideation 11/28/2019  Cocaine dependence (HCC) 03/05/2019   Major depressive disorder, recurrent episode, severe (HCC) 03/02/2019   Post traumatic stress disorder (PTSD) 03/02/2019   Anxiety and depression 03/02/2019   Substance induced mood disorder (HCC) 03/02/2019   MDD (major depressive disorder) 03/01/2019   PCP:  Pcp, No Pharmacy:   [facility] [address], [address] [place] [address] Phone: [phone] Fax: [phone]     Social Determinants of Health (SDOH) Interventions    Readmission Risk Interventions    05/19/2021   10:10 AM 05/13/2021   11:33 AM 12/04/2019    11:07 AM  Readmission Risk Prevention Plan  Transportation Screening Complete Complete Complete  PCP or Specialist Appt within 5-7 Days   Complete  Home Care Screening   Complete  Medication Review (RN CM)   Complete  Medication Review (RN Care Manager) Complete Complete   PCP or Specialist appointment within 3-5 days of discharge  Not Complete   PCP/Specialist Appt Not Complete comments first apt is 5/24 apt wll be made for [facility] new pcp apt   HRI or Home Care Consult Complete Complete   SW Recovery Care/Counseling Consult Complete Complete   Palliative Care Screening Not Applicable Not Applicable   Skilled Nursing Facility Not Applicable Not Applicable

## 2021-07-09 NOTE — Plan of Care (Signed)
  Problem: Clinical Measurements: Goal: Respiratory complications will improve Outcome: Progressing   Problem: Pain Managment: Goal: General experience of comfort will improve Outcome: Progressing   Problem: Safety: Goal: Ability to remain free from injury will improve Outcome: Progressing   Problem: Skin Integrity: Goal: Risk for impaired skin integrity will decrease Outcome: Progressing   

## 2021-08-10 ENCOUNTER — Other Ambulatory Visit: Payer: Self-pay

## 2021-08-10 ENCOUNTER — Other Ambulatory Visit (HOSPITAL_COMMUNITY): Payer: Self-pay

## 2021-08-21 ENCOUNTER — Encounter (HOSPITAL_COMMUNITY): Payer: Self-pay

## 2021-08-21 ENCOUNTER — Other Ambulatory Visit: Payer: Self-pay

## 2021-08-21 ENCOUNTER — Emergency Department (HOSPITAL_COMMUNITY)
Admission: EM | Admit: 2021-08-21 | Discharge: 2021-08-21 | Disposition: A | Discharge status: Home / Self Care | Payer: Self-pay | Attending: Emergency Medicine | Admitting: Emergency Medicine

## 2021-08-21 ENCOUNTER — Ambulatory Visit (HOSPITAL_COMMUNITY)
Admission: EM | Admit: 2021-08-21 | Discharge: 2021-08-21 | Disposition: A | Discharge status: Home / Self Care | Payer: MEDICAID | Attending: Family Medicine | Admitting: Family Medicine

## 2021-08-21 ENCOUNTER — Emergency Department (HOSPITAL_COMMUNITY): Payer: Self-pay

## 2021-08-21 DIAGNOSIS — Z7951 Long term (current) use of inhaled steroids: Secondary | ICD-10-CM | POA: Insufficient documentation

## 2021-08-21 DIAGNOSIS — J441 Chronic obstructive pulmonary disease with (acute) exacerbation: Secondary | ICD-10-CM

## 2021-08-21 DIAGNOSIS — R0602 Shortness of breath: Secondary | ICD-10-CM

## 2021-08-21 DIAGNOSIS — T7491XA Unspecified adult maltreatment, confirmed, initial encounter: Secondary | ICD-10-CM

## 2021-08-21 LAB — CBC WITH DIFFERENTIAL/PLATELET
Abs Immature Granulocytes: 0.09 10*3/uL — ABNORMAL HIGH (ref 0.00–0.07)
Basophils Absolute: 0 10*3/uL (ref 0.0–0.1)
Basophils Relative: 0 %
Eosinophils Absolute: 0.1 10*3/uL (ref 0.0–0.5)
Eosinophils Relative: 2 %
HCT: 39.1 % (ref 36.0–46.0)
Hemoglobin: 13 g/dL (ref 12.0–15.0)
Immature Granulocytes: 1 %
Lymphocytes Relative: 25 %
Lymphs Abs: 2.2 10*3/uL (ref 0.7–4.0)
MCH: 31 pg (ref 26.0–34.0)
MCHC: 33.2 g/dL (ref 30.0–36.0)
MCV: 93.1 fL (ref 80.0–100.0)
Monocytes Absolute: 1 10*3/uL (ref 0.1–1.0)
Monocytes Relative: 11 %
Neutro Abs: 5.5 10*3/uL (ref 1.7–7.7)
Neutrophils Relative %: 61 %
Platelets: 254 10*3/uL (ref 150–400)
RBC: 4.2 MIL/uL (ref 3.87–5.11)
RDW: 15.2 % (ref 11.5–15.5)
WBC: 9 10*3/uL (ref 4.0–10.5)
nRBC: 0 % (ref 0.0–0.2)

## 2021-08-21 LAB — TROPONIN I (HIGH SENSITIVITY)
Troponin I (High Sensitivity): 9 ng/L (ref <18)
Troponin I (High Sensitivity): 7 ng/L (ref <18)

## 2021-08-21 LAB — BASIC METABOLIC PANEL
Anion gap: 10 (ref 5–15)
BUN: 15 mg/dL (ref 6–20)
CO2: 26 mmol/L (ref 22–32)
Calcium: 9.6 mg/dL (ref 8.9–10.3)
Chloride: 102 mmol/L (ref 98–111)
Creatinine, Ser: 0.7 mg/dL (ref 0.44–1.00)
GFR, Estimated: >60 mL/min (ref >60)
Glucose, Bld: 90 mg/dL (ref 70–99)
Potassium: 3.5 mmol/L (ref 3.5–5.1)
Sodium: 138 mmol/L (ref 135–145)

## 2021-08-21 LAB — MAGNESIUM: Magnesium: 2.1 mg/dL (ref 1.7–2.4)

## 2021-08-21 MED ORDER — IPRATROPIUM-ALBUTEROL 0.5-2.5 (3) MG/3ML IN SOLN
3.0000 mL | Freq: Once | RESPIRATORY_TRACT | Status: AC
Start: 2021-08-21 — End: 2021-08-21
  Administered 2021-08-21: 3 mL via RESPIRATORY_TRACT

## 2021-08-21 MED ORDER — PREDNISONE 20 MG PO TABS
60.0000 mg | ORAL_TABLET | Freq: Once | ORAL | Status: AC
  Administered 2021-08-21: 60 mg via ORAL
  Filled 2021-08-21: qty 3

## 2021-08-21 MED ORDER — ALBUTEROL SULFATE HFA 108 (90 BASE) MCG/ACT IN AERS
2.0000 | INHALATION_SPRAY | RESPIRATORY_TRACT | Status: DC | PRN
  Administered 2021-08-21: 2 via RESPIRATORY_TRACT
  Filled 2021-08-21: qty 6.7

## 2021-08-21 MED ORDER — IPRATROPIUM-ALBUTEROL 0.5-2.5 (3) MG/3ML IN SOLN
RESPIRATORY_TRACT | Status: AC
  Filled 2021-08-21: qty 3

## 2021-08-21 MED ORDER — PREDNISONE 20 MG PO TABS: ORAL_TABLET | ORAL | 0 refills | Status: DC

## 2021-08-21 NOTE — ED Provider Notes (Signed)
[facility] EMERGENCY DEPARTMENT Provider Note   CSN: [id number] Arrival date & time: 08/21/21  1049     History {Add pertinent medical, surgical, social history, OB history to HPI:1} Chief Complaint  Patient presents with   Shortness of Breath    Rachel Nolan is a 51 y.o. female.  Patient has a history of COPD.  She presents with some wheezing but no cough.  Patient was sent over from the doctor's office.   Shortness of Breath      Home Medications Prior to Admission medications   Medication Sig Start Date End Date Taking? Authorizing Provider  albuterol (PROVENTIL) (2.5 MG/3ML) 0.083% nebulizer solution Take 3 mLs (2.5 mg total) by nebulization every 4 (four) hours as needed for wheezing or shortness of breath. 05/13/21 05/13/22 Yes Pokhrel, Laxman, MD  albuterol (VENTOLIN HFA) 108 (90 Base) MCG/ACT inhaler Inhale 2 puffs into the lungs every 4 (four) hours as needed for shortness of breath or wheezing. Patient taking differently: Inhale 2 puffs into the lungs every 6 (six) hours as needed for shortness of breath or wheezing. 04/08/21  Yes Clapacs, Jackquline Denmark, MD  amLODipine (NORVASC) 5 MG tablet Take 1 tablet (5 mg total) by mouth daily. Patient taking differently: Take 10 mg by mouth daily. 07/08/21 10/06/21 Yes Kathlen Mody, MD  FLUoxetine (PROZAC) 40 MG capsule Take 1 capsule (40 mg total) by mouth every morning. 07/08/21  Yes Kathlen Mody, MD  gabapentin (NEURONTIN) 400 MG capsule Take 800 mg by mouth 3 (three) times daily.   Yes [provider]  hydrOXYzine (ATARAX) 25 MG tablet Take 2 tablets (50 mg total) by mouth 3 (three) times daily as needed for anxiety. 07/08/21  Yes Kathlen Mody, MD  ipratropium-albuterol (DUONEB) 0.5-2.5 (3) MG/3ML SOLN USe 1 vial by nebulization every 6 (six) hours as needed (Use FIRST). Patient taking differently: Take 3 mLs by nebulization every 6 (six) hours as needed (shortness of breath/wheezing (use FIRST)). 07/08/21   Yes Kathlen Mody, MD  mometasone-formoterol (DULERA) 200-5 MCG/ACT AERO Inhale 2 puffs into the lungs 2 (two) times daily. 07/09/21  Yes Kathlen Mody, MD  montelukast (SINGULAIR) 10 MG tablet Take 1 tablet (10 mg total) by mouth at bedtime. 07/08/21  Yes Kathlen Mody, MD  nicotine (NICODERM CQ - DOSED IN MG/24 HOURS) 21 mg/24hr patch Place 1 patch (21 mg total) onto the skin daily. 07/08/21  Yes Kathlen Mody, MD  OXYGEN Inhale 2 L into the lungs daily as needed (when walking around).   Yes [provider]  prazosin (MINIPRESS) 1 MG capsule Take 1 capsule (1 mg total) by mouth at bedtime. 07/08/21  Yes Kathlen Mody, MD  predniSONE (DELTASONE) 20 MG tablet 2 tabs po daily x 3 days 08/21/21  Yes Bethann Berkshire, MD  PRESCRIPTION MEDICATION Inhale into the lungs at bedtime. CPAP   Yes [provider]  QUEtiapine (SEROQUEL) 100 MG tablet Take 1 tablet (100 mg total) by mouth at bedtime. 04/08/21  Yes Clapacs, Jackquline Denmark, MD  senna-docusate (SENOKOT-S) 8.6-50 MG tablet Take 1 tablet by mouth at bedtime as needed for mild constipation. 07/08/21  Yes Kathlen Mody, MD  traMADol (ULTRAM) 50 MG tablet Take 50 mg by mouth every 6 (six) hours as needed (pain).   Yes [provider]  traZODone (DESYREL) 150 MG tablet Take 1 tablet (150 mg total) by mouth at bedtime as needed for sleep. Patient taking differently: Take 150 mg by mouth at bedtime. 04/08/21  Yes Clapacs, Jackquline Denmark, MD  gabapentin (NEURONTIN) 100 MG capsule Take 2 capsules (200 mg total) by mouth 3 (three) times daily. Patient not taking: Reported on 08/21/2021 07/09/21   Hosie Poisson, MD  citalopram (CELEXA) 20 MG tablet Take 1 tablet (20 mg total) by mouth daily. 03/07/19 07/23/19  Connye Burkitt, NP  lisinopril (ZESTRIL) 10 MG tablet Take 1 tablet (10 mg total) by mouth daily. 03/07/19 09/11/19  Connye Burkitt, NP  pantoprazole (PROTONIX) 40 MG tablet Take 1 tablet (40 mg total) by mouth daily. 03/07/19 07/23/19  Connye Burkitt, NP       Allergies    Patient has no known allergies.    Review of Systems   Review of Systems  Respiratory:  Positive for shortness of breath.     Physical Exam Updated Vital Signs BP 125/84   Pulse 94   Temp 98.5 F (36.9 C) (Oral)   Resp (!) 24   Ht 5\' 6"  (1.676 m)   Wt 87.5 kg   LMP 04/19/2018   SpO2 99%   BMI 31.15 kg/m  Physical Exam  ED Results / Procedures / Treatments   Labs (all labs ordered are listed, but only abnormal results are displayed) Labs Reviewed  CBC WITH DIFFERENTIAL/PLATELET - Abnormal; Notable for the following components:      Result Value   Abs Immature Granulocytes 0.09 (*)    All other components within normal limits  BASIC METABOLIC PANEL  MAGNESIUM  TROPONIN I (HIGH SENSITIVITY)  TROPONIN I (HIGH SENSITIVITY)    EKG EKG Interpretation  Date/Time:  Friday August 21 2021 11:37:45 EDT Ventricular Rate:  106 PR Interval:  163 QRS Duration: 80 QT Interval:  359 QTC Calculation: 477 R Axis:   47 Text Interpretation: Sinus tachycardia Ventricular premature complex LAE, consider biatrial enlargement Anteroseptal infarct, age indeterminate Lateral leads are also involved Artifact in lead(s) I II III aVR aVL aVF V1 V4 Confirmed by Milton Ferguson [phone]) on 08/21/2021 2:23:25 PM  Radiology DG Chest 2 View  Result Date: 08/21/2021 CLINICAL DATA:  Shortness of breath today.  History of COPD. EXAM: CHEST - 2 VIEW COMPARISON:  Radiographs 07/04/2021 and 05/17/2021. FINDINGS: The heart size and mediastinal contours are normal. The lungs are clear. There is no pleural effusion or pneumothorax. No acute osseous findings are identified. Grossly stable thoracolumbar scoliosis post spinal fixation, incompletely visualized. IMPRESSION: Stable chest.  No evidence of active cardiopulmonary process. Electronically Signed   By: Richardean Sale M.D.   On: 08/21/2021 11:42    Procedures Procedures  {Document cardiac monitor, telemetry assessment procedure when  appropriate:1}  Medications Ordered in ED Medications  albuterol (VENTOLIN HFA) 108 (90 Base) MCG/ACT inhaler 2 puff (2 puffs Inhalation Given 08/21/21 1329)  predniSONE (DELTASONE) tablet 60 mg (has no administration in time range)    ED Course/ Medical Decision Making/ A&P                           Medical Decision Making Amount and/or Complexity of Data Reviewed Labs: ordered. Radiology: ordered.  Risk Prescription drug management.   Patient with bronchospasm.  He improved with albuterol and she will follow-up as needed.  Patient also given prednisone {Document critical care time when appropriate:1} {Document review of labs and clinical decision tools ie heart score, Chads2Vasc2 etc:1}  {Document your independent review of radiology images, and any outside records:1} {Document your discussion with family members, caretakers, and with consultants:1} {Document social determinants of health affecting pt's care:1} {  Document your decision making why or why not admission, treatments were needed:1} Final Clinical Impression(s) / ED Diagnoses Final diagnoses:  COPD exacerbation (HCC)    Rx / DC Orders ED Discharge Orders          Ordered    predniSONE (DELTASONE) 20 MG tablet        08/21/21 1435

## 2021-08-21 NOTE — ED Notes (Signed)
Patient is being discharged from the Urgent Care and sent to the Emergency Department via EMS . Per Provider, patient is in need of higher level of care due to wheezing, Sob, and drug use. Patient is aware and verbalizes understanding of plan of care.  Vitals:   08/21/21 0946  BP: 121/76  Pulse: (!) 130  SpO2: 98%
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
## 2021-08-21 NOTE — Discharge Instructions (Addendum)
We gave you 1 treatment with DuoNeb today.  We are sending you to the emergency room
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
## 2021-08-21 NOTE — ED Notes (Signed)
Patient transported to X-ray 

## 2021-08-21 NOTE — Discharge Instructions (Signed)
Follow-up with your family doctor next week if any problems with your breathing.  Return as needed

## 2021-09-20 ENCOUNTER — Encounter (HOSPITAL_COMMUNITY): Payer: Self-pay | Admitting: Emergency Medicine

## 2021-09-20 ENCOUNTER — Emergency Department (HOSPITAL_COMMUNITY): Payer: Self-pay

## 2021-09-20 ENCOUNTER — Inpatient Hospital Stay (HOSPITAL_COMMUNITY)
Admission: EM | Admit: 2021-09-20 | Discharge: 2021-09-23 | DRG: 190 | Discharge status: Against Medical Advice | Payer: Self-pay | Attending: Internal Medicine | Admitting: Internal Medicine

## 2021-09-20 DIAGNOSIS — D72829 Elevated white blood cell count, unspecified: Secondary | ICD-10-CM | POA: Diagnosis present

## 2021-09-20 DIAGNOSIS — F17210 Nicotine dependence, cigarettes, uncomplicated: Secondary | ICD-10-CM | POA: Diagnosis present

## 2021-09-20 DIAGNOSIS — Z8249 Family history of ischemic heart disease and other diseases of the circulatory system: Secondary | ICD-10-CM

## 2021-09-20 DIAGNOSIS — J441 Chronic obstructive pulmonary disease with (acute) exacerbation: Principal | ICD-10-CM | POA: Diagnosis present

## 2021-09-20 DIAGNOSIS — Z716 Tobacco abuse counseling: Secondary | ICD-10-CM

## 2021-09-20 DIAGNOSIS — Z79899 Other long term (current) drug therapy: Secondary | ICD-10-CM

## 2021-09-20 DIAGNOSIS — Z79891 Long term (current) use of opiate analgesic: Secondary | ICD-10-CM

## 2021-09-20 DIAGNOSIS — I10 Essential (primary) hypertension: Secondary | ICD-10-CM | POA: Diagnosis present

## 2021-09-20 DIAGNOSIS — E669 Obesity, unspecified: Secondary | ICD-10-CM | POA: Diagnosis present

## 2021-09-20 DIAGNOSIS — R0603 Acute respiratory distress: Secondary | ICD-10-CM

## 2021-09-20 DIAGNOSIS — F1990 Other psychoactive substance use, unspecified, uncomplicated: Secondary | ICD-10-CM

## 2021-09-20 DIAGNOSIS — Z720 Tobacco use: Secondary | ICD-10-CM | POA: Diagnosis present

## 2021-09-20 DIAGNOSIS — R739 Hyperglycemia, unspecified: Secondary | ICD-10-CM | POA: Diagnosis not present

## 2021-09-20 DIAGNOSIS — Z7151 Drug abuse counseling and surveillance of drug abuser: Secondary | ICD-10-CM

## 2021-09-20 DIAGNOSIS — Z7951 Long term (current) use of inhaled steroids: Secondary | ICD-10-CM

## 2021-09-20 DIAGNOSIS — F1410 Cocaine abuse, uncomplicated: Secondary | ICD-10-CM | POA: Diagnosis present

## 2021-09-20 DIAGNOSIS — Z23 Encounter for immunization: Secondary | ICD-10-CM

## 2021-09-20 DIAGNOSIS — Z6830 Body mass index (BMI) 30.0-30.9, adult: Secondary | ICD-10-CM

## 2021-09-20 DIAGNOSIS — J9621 Acute and chronic respiratory failure with hypoxia: Secondary | ICD-10-CM | POA: Diagnosis present

## 2021-09-20 DIAGNOSIS — J9611 Chronic respiratory failure with hypoxia: Secondary | ICD-10-CM

## 2021-09-20 DIAGNOSIS — F1910 Other psychoactive substance abuse, uncomplicated: Secondary | ICD-10-CM | POA: Diagnosis present

## 2021-09-20 DIAGNOSIS — F419 Anxiety disorder, unspecified: Secondary | ICD-10-CM | POA: Diagnosis present

## 2021-09-20 DIAGNOSIS — Z713 Dietary counseling and surveillance: Secondary | ICD-10-CM

## 2021-09-20 DIAGNOSIS — Z7952 Long term (current) use of systemic steroids: Secondary | ICD-10-CM

## 2021-09-20 DIAGNOSIS — Z9981 Dependence on supplemental oxygen: Secondary | ICD-10-CM

## 2021-09-20 DIAGNOSIS — F1290 Cannabis use, unspecified, uncomplicated: Secondary | ICD-10-CM | POA: Diagnosis present

## 2021-09-20 DIAGNOSIS — F32A Depression, unspecified: Secondary | ICD-10-CM | POA: Diagnosis present

## 2021-09-20 LAB — BASIC METABOLIC PANEL
Anion gap: 10 (ref 5–15)
BUN: 15 mg/dL (ref 6–20)
CO2: 24 mmol/L (ref 22–32)
Calcium: 9.6 mg/dL (ref 8.9–10.3)
Chloride: 104 mmol/L (ref 98–111)
Creatinine, Ser: 0.7 mg/dL (ref 0.44–1.00)
GFR, Estimated: >60 mL/min (ref >60)
Glucose, Bld: 84 mg/dL (ref 70–99)
Potassium: 3.9 mmol/L (ref 3.5–5.1)
Sodium: 138 mmol/L (ref 135–145)

## 2021-09-20 LAB — CBC
HCT: 36.4 % (ref 36.0–46.0)
Hemoglobin: 12.4 g/dL (ref 12.0–15.0)
MCH: 31.2 pg (ref 26.0–34.0)
MCHC: 34.1 g/dL (ref 30.0–36.0)
MCV: 91.5 fL (ref 80.0–100.0)
Platelets: 292 10*3/uL (ref 150–400)
RBC: 3.98 MIL/uL (ref 3.87–5.11)
RDW: 15.3 % (ref 11.5–15.5)
WBC: 7.4 10*3/uL (ref 4.0–10.5)
nRBC: 0 % (ref 0.0–0.2)

## 2021-09-20 LAB — RAPID URINE DRUG SCREEN, HOSP PERFORMED
Amphetamines: NOT DETECTED
Barbiturates: NOT DETECTED
Benzodiazepines: NOT DETECTED
Cocaine: POSITIVE — AB
Opiates: NOT DETECTED
Tetrahydrocannabinol: NOT DETECTED

## 2021-09-20 LAB — I-STAT BETA HCG BLOOD, ED (MC, WL, AP ONLY): I-stat hCG, quantitative: <5 m[IU]/mL (ref <5)

## 2021-09-20 LAB — BRAIN NATRIURETIC PEPTIDE: B Natriuretic Peptide: 14.1 pg/mL (ref 0.0–100.0)

## 2021-09-20 LAB — TROPONIN I (HIGH SENSITIVITY)
Troponin I (High Sensitivity): 8 ng/L (ref <18)
Troponin I (High Sensitivity): 8 ng/L (ref <18)

## 2021-09-20 MED ORDER — IPRATROPIUM-ALBUTEROL 0.5-2.5 (3) MG/3ML IN SOLN
3.0000 mL | Freq: Once | RESPIRATORY_TRACT | Status: AC
  Administered 2021-09-20: 3 mL via RESPIRATORY_TRACT
  Filled 2021-09-20: qty 3

## 2021-09-20 MED ORDER — MAGNESIUM SULFATE 2 GM/50ML IV SOLN
2.0000 g | Freq: Once | INTRAVENOUS | Status: AC
  Administered 2021-09-20: 2 g via INTRAVENOUS
  Filled 2021-09-20: qty 50

## 2021-09-20 MED ORDER — PREDNISONE 20 MG PO TABS
40.0000 mg | ORAL_TABLET | Freq: Every day | ORAL | Status: DC
  Administered 2021-09-21: 40 mg via ORAL
  Filled 2021-09-20: qty 2

## 2021-09-20 MED ORDER — HEPARIN SODIUM (PORCINE) 5000 UNIT/ML IJ SOLN
5000.0000 [IU] | Freq: Three times a day (TID) | INTRAMUSCULAR | Status: DC
  Administered 2021-09-20 – 2021-09-22 (×6): 5000 [IU] via SUBCUTANEOUS
  Filled 2021-09-20 (×7): qty 1

## 2021-09-20 MED ORDER — IPRATROPIUM-ALBUTEROL 0.5-2.5 (3) MG/3ML IN SOLN
3.0000 mL | Freq: Once | RESPIRATORY_TRACT | Status: AC
  Administered 2021-09-20: 3 mL via RESPIRATORY_TRACT

## 2021-09-20 MED ORDER — METHYLPREDNISOLONE SODIUM SUCC 125 MG IJ SOLR
125.0000 mg | Freq: Once | INTRAMUSCULAR | Status: AC
  Administered 2021-09-20: 125 mg via INTRAVENOUS
  Filled 2021-09-20: qty 2

## 2021-09-20 MED ORDER — IPRATROPIUM-ALBUTEROL 0.5-2.5 (3) MG/3ML IN SOLN
3.0000 mL | Freq: Four times a day (QID) | RESPIRATORY_TRACT | Status: DC
  Administered 2021-09-20 – 2021-09-21 (×3): 3 mL via RESPIRATORY_TRACT
  Filled 2021-09-20 (×3): qty 3

## 2021-09-20 MED ORDER — ALBUTEROL SULFATE (2.5 MG/3ML) 0.083% IN NEBU
10.0000 mg/h | INHALATION_SOLUTION | Freq: Once | RESPIRATORY_TRACT | Status: DC
Start: 2021-09-20 — End: 2021-09-23

## 2021-09-20 MED ORDER — ACETAMINOPHEN 650 MG RE SUPP: 650.0000 mg | Freq: Four times a day (QID) | RECTAL | Status: DC | PRN

## 2021-09-20 MED ORDER — PROMETHAZINE HCL 25 MG PO TABS: 12.5000 mg | ORAL_TABLET | Freq: Four times a day (QID) | ORAL | Status: DC | PRN

## 2021-09-20 MED ORDER — ALBUTEROL SULFATE (2.5 MG/3ML) 0.083% IN NEBU
10.0000 mg/h | INHALATION_SOLUTION | RESPIRATORY_TRACT | Status: AC
  Administered 2021-09-20: 10 mg/h via RESPIRATORY_TRACT
  Filled 2021-09-20: qty 12

## 2021-09-20 MED ORDER — ACETAMINOPHEN 325 MG PO TABS
650.0000 mg | ORAL_TABLET | Freq: Four times a day (QID) | ORAL | Status: DC | PRN
  Administered 2021-09-21: 650 mg via ORAL
  Filled 2021-09-20: qty 2

## 2021-09-20 MED ORDER — CLONIDINE HCL 0.1 MG PO TABS: 0.1000 mg | ORAL_TABLET | ORAL | Status: DC | PRN

## 2021-09-20 MED ORDER — ALBUTEROL SULFATE (2.5 MG/3ML) 0.083% IN NEBU
10.0000 mg/h | INHALATION_SOLUTION | Freq: Once | RESPIRATORY_TRACT | Status: AC
  Administered 2021-09-20: 10 mg/h via RESPIRATORY_TRACT
  Filled 2021-09-20: qty 3

## 2021-09-20 MED ORDER — AMLODIPINE BESYLATE 10 MG PO TABS
10.0000 mg | ORAL_TABLET | Freq: Every day | ORAL | Status: DC
  Administered 2021-09-21 – 2021-09-23 (×3): 10 mg via ORAL
  Filled 2021-09-20: qty 1
  Filled 2021-09-20: qty 2
  Filled 2021-09-20: qty 1

## 2021-09-20 MED ORDER — NICOTINE 14 MG/24HR TD PT24
14.0000 mg | MEDICATED_PATCH | Freq: Every day | TRANSDERMAL | Status: DC
  Administered 2021-09-21: 14 mg via TRANSDERMAL
  Filled 2021-09-20: qty 1

## 2021-09-20 NOTE — ED Triage Notes (Signed)
Patient c/o shortness of breath and left sided chest pain onset of 15 mins ago. Patient smokes and has dry cough. Reports sharp stabbing pain to left chest and is non radiating.

## 2021-09-20 NOTE — Assessment & Plan Note (Signed)
Prn nicotine patch.

## 2021-09-20 NOTE — Assessment & Plan Note (Signed)
Continue with supplemental O2 

## 2021-09-20 NOTE — ED Provider Notes (Signed)
[facility] EMERGENCY DEPARTMENT Provider Note   CSN: [id number] Arrival date & time: 09/20/21  1624     History  Chief Complaint  Patient presents with   Chest Pain    Rachel Nolan is a 51 y.o. female.   Chest Pain  Patient is a 51 year old female, she has a history of COPD, she has hypertension on amlodipine and also takes gabapentin, Celexa, Zestril, Protonix and Seroquel.  The patient does endorse using drugs in fact she states that she smokes marijuana frequently last use was last night and today she was smoking cocaine which she states is a regular thing for her as well.  She was at her sister's apartment when she became acutely short of breath with acute joint chest pain and states that she is coughing but it is phlegm free dry cough with no fever, no swelling of the legs.  The patient is fairly dyspneic and has difficulty speaking in full sentences, she states that she does wear oxygen at baseline secondary to her COPD.    Home Medications Prior to Admission medications   Medication Sig Start Date End Date Taking? Authorizing Provider  albuterol (PROVENTIL) (2.5 MG/3ML) 0.083% nebulizer solution Take 3 mLs (2.5 mg total) by nebulization every 4 (four) hours as needed for wheezing or shortness of breath. 05/13/21 05/13/22  Pokhrel, Rebekah Chesterfield, MD  albuterol (VENTOLIN HFA) 108 (90 Base) MCG/ACT inhaler Inhale 2 puffs into the lungs every 4 (four) hours as needed for shortness of breath or wheezing. Patient taking differently: Inhale 2 puffs into the lungs every 6 (six) hours as needed for shortness of breath or wheezing. 04/08/21   Clapacs, Jackquline Denmark, MD  amLODipine (NORVASC) 5 MG tablet Take 1 tablet (5 mg total) by mouth daily. Patient taking differently: Take 10 mg by mouth daily. 07/08/21 10/06/21  Kathlen Mody, MD  FLUoxetine (PROZAC) 40 MG capsule Take 1 capsule (40 mg total) by mouth every morning. 07/08/21   Kathlen Mody, MD  gabapentin (NEURONTIN) 100 MG  capsule Take 2 capsules (200 mg total) by mouth 3 (three) times daily. Patient not taking: Reported on 08/21/2021 07/09/21   Kathlen Mody, MD  gabapentin (NEURONTIN) 400 MG capsule Take 800 mg by mouth 3 (three) times daily.    [provider]  hydrOXYzine (ATARAX) 25 MG tablet Take 2 tablets (50 mg total) by mouth 3 (three) times daily as needed for anxiety. 07/08/21   Kathlen Mody, MD  ipratropium-albuterol (DUONEB) 0.5-2.5 (3) MG/3ML SOLN USe 1 vial by nebulization every 6 (six) hours as needed (Use FIRST). Patient taking differently: Take 3 mLs by nebulization every 6 (six) hours as needed (shortness of breath/wheezing (use FIRST)). 07/08/21   Kathlen Mody, MD  mometasone-formoterol (DULERA) 200-5 MCG/ACT AERO Inhale 2 puffs into the lungs 2 (two) times daily. 07/09/21   Kathlen Mody, MD  montelukast (SINGULAIR) 10 MG tablet Take 1 tablet (10 mg total) by mouth at bedtime. 07/08/21   Kathlen Mody, MD  nicotine (NICODERM CQ - DOSED IN MG/24 HOURS) 21 mg/24hr patch Place 1 patch (21 mg total) onto the skin daily. 07/08/21   Kathlen Mody, MD  OXYGEN Inhale 2 L into the lungs daily as needed (when walking around).    [provider]  prazosin (MINIPRESS) 1 MG capsule Take 1 capsule (1 mg total) by mouth at bedtime. 07/08/21   Kathlen Mody, MD  predniSONE (DELTASONE) 20 MG tablet 2 tabs po daily x 3 days 08/21/21   Bethann Berkshire, MD  PRESCRIPTION  MEDICATION Inhale into the lungs at bedtime. CPAP    [provider]  QUEtiapine (SEROQUEL) 100 MG tablet Take 1 tablet (100 mg total) by mouth at bedtime. 04/08/21   Clapacs, Jackquline Denmark, MD  senna-docusate (SENOKOT-S) 8.6-50 MG tablet Take 1 tablet by mouth at bedtime as needed for mild constipation. 07/08/21   Kathlen Mody, MD  traMADol (ULTRAM) 50 MG tablet Take 50 mg by mouth every 6 (six) hours as needed (pain).    [provider]  traZODone (DESYREL) 150 MG tablet Take 1 tablet (150 mg total) by mouth at bedtime as needed for  sleep. Patient taking differently: Take 150 mg by mouth at bedtime. 04/08/21   Clapacs, Jackquline Denmark, MD  citalopram (CELEXA) 20 MG tablet Take 1 tablet (20 mg total) by mouth daily. 03/07/19 07/23/19  Aldean Baker, NP  lisinopril (ZESTRIL) 10 MG tablet Take 1 tablet (10 mg total) by mouth daily. 03/07/19 09/11/19  Aldean Baker, NP  pantoprazole (PROTONIX) 40 MG tablet Take 1 tablet (40 mg total) by mouth daily. 03/07/19 07/23/19  Aldean Baker, NP      Allergies    Patient has no known allergies.    Review of Systems   Review of Systems  Cardiovascular:  Positive for chest pain.  All other systems reviewed and are negative.   Physical Exam Updated Vital Signs BP 117/75   Pulse 87   Temp 98.3 F (36.8 C) (Oral)   Resp 19   Ht 1.676 m (5\' 6" )   Wt 86.2 kg   LMP 04/19/2018   SpO2 99%   BMI 30.67 kg/m  Physical Exam Vitals and nursing note reviewed.  Constitutional:      General: She is in acute distress.     Appearance: She is well-developed. She is ill-appearing.  HENT:     Head: Normocephalic and atraumatic.     Mouth/Throat:     Pharynx: No oropharyngeal exudate.  Eyes:     General: No scleral icterus.       Right eye: No discharge.        Left eye: No discharge.     Conjunctiva/sclera: Conjunctivae normal.     Pupils: Pupils are equal, round, and reactive to light.  Neck:     Thyroid: No thyromegaly.     Vascular: No JVD.  Cardiovascular:     Rate and Rhythm: Regular rhythm. Tachycardia present.     Heart sounds: Normal heart sounds. No murmur heard.    No friction rub. No gallop.     Comments: Tachycardic to 105 bpm, normal pulses, no JVD, no edema Pulmonary:     Effort: Respiratory distress present.     Breath sounds: Wheezing present. No rales.     Comments: Diffuse expiratory wheezing, speaks in shortened sentences, prolonged expiratory phase, accessory muscle use, decreased breath sounds bilaterally Abdominal:     General: Bowel sounds are normal. There is no  distension.     Palpations: Abdomen is soft. There is no mass.     Tenderness: There is no abdominal tenderness.  Musculoskeletal:        General: No tenderness. Normal range of motion.     Cervical back: Normal range of motion and neck supple.     Right lower leg: No edema.     Left lower leg: No edema.  Lymphadenopathy:     Cervical: No cervical adenopathy.  Skin:    General: Skin is warm and dry.     Findings:  No erythema or rash.  Neurological:     Mental Status: She is alert.     Coordination: Coordination normal.  Psychiatric:        Behavior: Behavior normal.     ED Results / Procedures / Treatments   Labs (all labs ordered are listed, but only abnormal results are displayed) Labs Reviewed  BASIC METABOLIC PANEL  CBC  BRAIN NATRIURETIC PEPTIDE  RAPID URINE DRUG SCREEN, HOSP PERFORMED  I-STAT BETA HCG BLOOD, ED ([facility], [facility], [facility] ONLY)  TROPONIN I (HIGH SENSITIVITY)  TROPONIN I (HIGH SENSITIVITY)    EKG EKG Interpretation  Date/Time:  Sunday September 20 2021 16:32:36 EDT Ventricular Rate:  99 PR Interval:  140 QRS Duration: 74 QT Interval:  368 QTC Calculation: 472 R Axis:   42 Text Interpretation: Normal sinus rhythm Possible Left atrial enlargement Cannot rule out Anterior infarct , age undetermined Abnormal ECG When compared with ECG of 21-Aug-2021 11:37, PREVIOUS ECG IS PRESENT since last tracing no significant change Confirmed by Eber Hong ([id number]) on 09/20/2021 5:00:59 PM  Radiology DG Chest 2 View  Result Date: 09/20/2021 CLINICAL DATA:  Chest pain, shortness of breath EXAM: CHEST - 2 VIEW COMPARISON:  08/21/2021 FINDINGS: Cardiac size is within normal limits. There are no new infiltrates or signs of pulmonary edema. There is no pleural effusion or pneumothorax. Harrington rod is seen in thoracolumbar spine. IMPRESSION: There are no new infiltrates or signs of pulmonary edema. Electronically Signed   By: Ernie Avena M.D.   On: 09/20/2021 17:10     Procedures Procedures    Medications Ordered in ED Medications  albuterol (PROVENTIL) (2.5 MG/3ML) 0.083% nebulizer solution (10 mg/hr Nebulization New Bag/Given 09/20/21 2104)  ipratropium-albuterol (DUONEB) 0.5-2.5 (3) MG/3ML nebulizer solution 3 mL (3 mLs Nebulization Given 09/20/21 1640)  methylPREDNISolone sodium succinate (SOLU-MEDROL) 125 mg/2 mL injection 125 mg (125 mg Intravenous Given 09/20/21 1933)  albuterol (PROVENTIL) (2.5 MG/3ML) 0.083% nebulizer solution (10 mg/hr Nebulization Given 09/20/21 1819)  ipratropium-albuterol (DUONEB) 0.5-2.5 (3) MG/3ML nebulizer solution 3 mL (3 mLs Nebulization Given 09/20/21 1819)  magnesium sulfate IVPB 2 g 50 mL (0 g Intravenous Stopped 09/20/21 2057)    ED Course/ Medical Decision Making/ A&P                           Medical Decision Making Amount and/or Complexity of Data Reviewed Labs: ordered. Radiology: ordered.  Risk Prescription drug management. Decision regarding hospitalization.   This patient presents to the ED for concern of shortness of breath and chest pain, this involves an extensive number of treatment options, and is a complaint that carries with it a high risk of complications and morbidity.  The differential diagnosis includes potential myocardial infarction with the chest pain and the shortness of breath however the patient has severe wheezing and with her smoking cocaine it is likely more related to a pulmonary problem.  This does not seem to be pulmonary embolism, her oxygenation is fine at 93% on her home oxygen, she is slightly tachycardic and will need magnesium treatment, Solu-Medrol, continuous nebulizer therapy and a DuoNeb.  The patient has taken multiple breathing treatments prior to arrival without significant improvement   Co morbidities that complicate the patient evaluation  Chronic drug use, inhalation Chronic COPD on home oxygen   Additional history obtained:  Additional history obtained from  electronic medical record External records from outside source obtained and reviewed including admission for shortness of breath at the beginning  of August, this was at [facility], she had also been seen earlier that month for COPD a couple of times, she was admitted in July for COPD, in June for COPD, April for COPD   Lab Tests:  I Ordered, and personally interpreted labs.  The pertinent results include: Troponin was unremarkable, metabolic panel unremarkable, CBC unremarkable, BNP 14   Imaging Studies ordered:  I ordered imaging studies including chest x-ray I independently visualized and interpreted imaging which showed no new infiltrates or edema I agree with the radiologist interpretation   Cardiac Monitoring: / EKG:  The patient was maintained on a cardiac monitor.  I personally viewed and interpreted the cardiac monitored which showed an underlying rhythm of: Borderline tachycardia   Consultations Obtained:  I requested consultation with the hospitalist,  and discussed lab and imaging findings as well as pertinent plan - they recommend: They will see the patient in consultation for admission   Problem List / ED Course / Critical interventions / Medication management  The patient has acute respiratory distress, this is likely secondary to the inflammatory condition of the lungs after being exposed to multiple inhalation events with smoking cocaine tobacco and marijuana.  The patient has known underlying severe COPD and has had multiple admissions in the past. I ordered medication including Solu-Medrol, multiple continuous nebulizer treatments and a DuoNeb as well as magnesium sulfate for COPD exacerbation, respiratory distress and increased work of breathing Reevaluation of the patient after these medicines showed that the patient slightly improved but not to the point where she is able to go home.  She still has a prolonged expiratory phase with diffuse expiratory  wheezing on exam after multiple treatments. I have reviewed the patients home medicines and have made adjustments as needed   Social Determinants of Health:  Substance abuse COPD   Test / Admission - Considered:  We will need to be admitted to the hospital to high level of care, I discussed this with the hospitalist at around 9:30 PM, they will see the patient for admission evaluation         Final Clinical Impression(s) / ED Diagnoses Final diagnoses:  COPD exacerbation (HCC)  Respiratory distress  Cocaine abuse (HCC)     Eber Hong, MD 09/20/21 2131

## 2021-09-20 NOTE — ED Notes (Signed)
RRT at patient bedside.

## 2021-09-20 NOTE — ED Notes (Signed)
RN unsuccessful in starting IVx2

## 2021-09-20 NOTE — H&P (Signed)
History and Physical    Dia Donate VVO:[id number] DOB: [birth year] DOA: 09/20/2021  DOS: the patient was seen and examined on 09/20/2021  PCP: Pcp, No   Patient coming from: Home  I have personally briefly reviewed patient's old medical records in [facility]  CC: SOB HPI: 51 year old African-American female history of polysubstance abuse, chronic cocaine abuse, chronic opiate abuse, history of COPD, chronic respiratory failure on home oxygen at 2 L a minute, hypertension presents to the ER today with sudden onset of shortness of breath today.  Patient admits to using cocaine on a regular basis.  She was just released from [facility] 2 weeks ago for cocaine and tobacco induced COPD exacerbation.  Patient continues to abuse cocaine on a daily basis.  On arrival temp 98.5 heart rate 103 blood pressure 130/91.  Patient started on a continuous nebulizer treatment.    Laboratory evaluation was within normal limits.  BMP was normal.  Chest x-ray showed no acute cardiopulmonary disease.  Urine drug screen is pending.  Patient still wheezing.  [facility] hospitalist contacted for admission.   ED Course: Labs within normal limits.  Chest x-ray negative for pneumonia.  Review of Systems:  Review of Systems  Constitutional: Negative.   HENT: Negative.    Eyes: Negative.   Respiratory:  Positive for shortness of breath and wheezing.   Cardiovascular:  Positive for chest pain.  Gastrointestinal: Negative.   Genitourinary: Negative.   Musculoskeletal: Negative.   Skin: Negative.   Neurological: Negative.   Endo/Heme/Allergies: Negative.   Psychiatric/Behavioral:  Positive for substance abuse.   All other systems reviewed and are negative.   Past Medical History:  Diagnosis Date   Anxiety    Asthma    COPD (chronic obstructive pulmonary disease) (HCC)    Depression    Hypertension    Scoliosis     Past Surgical History:  Procedure Laterality Date   BACK SURGERY      ECTOPIC PREGNANCY SURGERY       reports that she has been smoking cigarettes. She has been smoking an average of .5 packs per day. She has never used smokeless tobacco. She reports current alcohol use. She reports current drug use. Drugs: Marijuana, Cocaine, and "Crack" cocaine.  No Known Allergies  Family History  Problem Relation Age of Onset   Hypertension Mother    Hypertension Father     Prior to Admission medications   Medication Sig Start Date End Date Taking? Authorizing Provider  albuterol (PROVENTIL) (2.5 MG/3ML) 0.083% nebulizer solution Take 3 mLs (2.5 mg total) by nebulization every 4 (four) hours as needed for wheezing or shortness of breath. 05/13/21 05/13/22  Pokhrel, Rebekah Chesterfield, MD  albuterol (VENTOLIN HFA) 108 (90 Base) MCG/ACT inhaler Inhale 2 puffs into the lungs every 4 (four) hours as needed for shortness of breath or wheezing. Patient taking differently: Inhale 2 puffs into the lungs every 6 (six) hours as needed for shortness of breath or wheezing. 04/08/21   Clapacs, Jackquline Denmark, MD  amLODipine (NORVASC) 5 MG tablet Take 1 tablet (5 mg total) by mouth daily. Patient taking differently: Take 10 mg by mouth daily. 07/08/21 10/06/21  Kathlen Mody, MD  FLUoxetine (PROZAC) 40 MG capsule Take 1 capsule (40 mg total) by mouth every morning. 07/08/21   Kathlen Mody, MD  gabapentin (NEURONTIN) 100 MG capsule Take 2 capsules (200 mg total) by mouth 3 (three) times daily. Patient not taking: Reported on 08/21/2021 07/09/21   Kathlen Mody, MD  gabapentin (NEURONTIN) 400  MG capsule Take 800 mg by mouth 3 (three) times daily.    [provider]  hydrOXYzine (ATARAX) 25 MG tablet Take 2 tablets (50 mg total) by mouth 3 (three) times daily as needed for anxiety. 07/08/21   Kathlen Mody, MD  ipratropium-albuterol (DUONEB) 0.5-2.5 (3) MG/3ML SOLN USe 1 vial by nebulization every 6 (six) hours as needed (Use FIRST). Patient taking differently: Take 3 mLs by nebulization every 6 (six)  hours as needed (shortness of breath/wheezing (use FIRST)). 07/08/21   Kathlen Mody, MD  mometasone-formoterol (DULERA) 200-5 MCG/ACT AERO Inhale 2 puffs into the lungs 2 (two) times daily. 07/09/21   Kathlen Mody, MD  montelukast (SINGULAIR) 10 MG tablet Take 1 tablet (10 mg total) by mouth at bedtime. 07/08/21   Kathlen Mody, MD  nicotine (NICODERM CQ - DOSED IN MG/24 HOURS) 21 mg/24hr patch Place 1 patch (21 mg total) onto the skin daily. 07/08/21   Kathlen Mody, MD  OXYGEN Inhale 2 L into the lungs daily as needed (when walking around).    [provider]  prazosin (MINIPRESS) 1 MG capsule Take 1 capsule (1 mg total) by mouth at bedtime. 07/08/21   Kathlen Mody, MD  predniSONE (DELTASONE) 20 MG tablet 2 tabs po daily x 3 days 08/21/21   Bethann Berkshire, MD  PRESCRIPTION MEDICATION Inhale into the lungs at bedtime. CPAP    [provider]  QUEtiapine (SEROQUEL) 100 MG tablet Take 1 tablet (100 mg total) by mouth at bedtime. 04/08/21   Clapacs, Jackquline Denmark, MD  senna-docusate (SENOKOT-S) 8.6-50 MG tablet Take 1 tablet by mouth at bedtime as needed for mild constipation. 07/08/21   Kathlen Mody, MD  traMADol (ULTRAM) 50 MG tablet Take 50 mg by mouth every 6 (six) hours as needed (pain).    [provider]  traZODone (DESYREL) 150 MG tablet Take 1 tablet (150 mg total) by mouth at bedtime as needed for sleep. Patient taking differently: Take 150 mg by mouth at bedtime. 04/08/21   Clapacs, Jackquline Denmark, MD  citalopram (CELEXA) 20 MG tablet Take 1 tablet (20 mg total) by mouth daily. 03/07/19 07/23/19  Aldean Baker, NP  lisinopril (ZESTRIL) 10 MG tablet Take 1 tablet (10 mg total) by mouth daily. 03/07/19 09/11/19  Aldean Baker, NP  pantoprazole (PROTONIX) 40 MG tablet Take 1 tablet (40 mg total) by mouth daily. 03/07/19 07/23/19  Aldean Baker, NP    Physical Exam: Vitals:   09/20/21 2100 09/20/21 2107 09/20/21 2130 09/20/21 2145  BP: 117/75  124/84 (!) 140/85  Pulse: 87  72 66  Resp:  19  17 17   Temp:      TempSrc:      SpO2: 95% 99% 100% 100%  Weight:      Height:        Physical Exam Vitals and nursing note reviewed.  Constitutional:      General: She is not in acute distress.    Appearance: She is not ill-appearing, toxic-appearing or diaphoretic.  HENT:     Head: Normocephalic and atraumatic.     Nose: Nose normal.  Cardiovascular:     Rate and Rhythm: Regular rhythm. Tachycardia present.  Pulmonary:     Breath sounds: Examination of the right-upper field reveals wheezing. Examination of the left-upper field reveals wheezing. Examination of the right-middle field reveals wheezing. Examination of the left-middle field reveals wheezing. Examination of the right-lower field reveals wheezing. Examination of the left-lower field reveals wheezing. Decreased breath sounds and wheezing  present.  Abdominal:     General: Bowel sounds are normal. There is no distension.     Tenderness: There is no abdominal tenderness. There is no guarding or rebound.  Musculoskeletal:     Right lower leg: No edema.     Left lower leg: No edema.  Skin:    General: Skin is warm and dry.     Capillary Refill: Capillary refill takes less than 2 seconds.  Neurological:     General: No focal deficit present.     Mental Status: She is alert and oriented to person, place, and time.      Labs on Admission: I have personally reviewed following labs and imaging studies  CBC: Recent Labs  Lab 09/20/21 1643  WBC 7.4  HGB 12.4  HCT 36.4  MCV 91.5  PLT 292   Basic Metabolic Panel: Recent Labs  Lab 09/20/21 1643  NA 138  K 3.9  CL 104  CO2 24  GLUCOSE 84  BUN 15  CREATININE 0.70  CALCIUM 9.6   GFR: Estimated Creatinine Clearance: 91 mL/min (by C-G formula based on SCr of 0.7 mg/dL). Liver Function Tests: No results for input(s): "AST", "ALT", "ALKPHOS", "BILITOT", "PROT", "ALBUMIN" in the last 168 hours. No results for input(s): "LIPASE", "AMYLASE" in the last 168  hours. No results for input(s): "AMMONIA" in the last 168 hours. Coagulation Profile: No results for input(s): "INR", "PROTIME" in the last 168 hours. Cardiac Enzymes: Recent Labs  Lab 09/20/21 1643 09/20/21 1804  TROPONINIHS 8 8   BNP (last 3 results) No results for input(s): "PROBNP" in the last 8760 hours. HbA1C: No results for input(s): "HGBA1C" in the last 72 hours. CBG: No results for input(s): "GLUCAP" in the last 168 hours. Lipid Profile: No results for input(s): "CHOL", "HDL", "LDLCALC", "TRIG", "CHOLHDL", "LDLDIRECT" in the last 72 hours. Thyroid Function Tests: No results for input(s): "TSH", "T4TOTAL", "FREET4", "T3FREE", "THYROIDAB" in the last 72 hours. Anemia Panel: No results for input(s): "VITAMINB12", "FOLATE", "FERRITIN", "TIBC", "IRON", "RETICCTPCT" in the last 72 hours. Urine analysis:    Component Value Date/Time   COLORURINE STRAW (A) 07/14/2020 0600   APPEARANCEUR CLEAR 07/14/2020 0600   LABSPEC 1.015 07/14/2020 0600   PHURINE 7.0 07/14/2020 0600   GLUCOSEU NEGATIVE 07/14/2020 0600   HGBUR NEGATIVE 07/14/2020 0600   BILIRUBINUR NEGATIVE 07/14/2020 0600   KETONESUR NEGATIVE 07/14/2020 0600   PROTEINUR NEGATIVE 07/14/2020 0600   NITRITE NEGATIVE 07/14/2020 0600   LEUKOCYTESUR NEGATIVE 07/14/2020 0600    Radiological Exams on Admission: I have personally reviewed images DG Chest 2 View  Result Date: 09/20/2021 CLINICAL DATA:  Chest pain, shortness of breath EXAM: CHEST - 2 VIEW COMPARISON:  08/21/2021 FINDINGS: Cardiac size is within normal limits. There are no new infiltrates or signs of pulmonary edema. There is no pleural effusion or pneumothorax. Harrington rod is seen in thoracolumbar spine. IMPRESSION: There are no new infiltrates or signs of pulmonary edema. Electronically Signed   By: Ernie Avena M.D.   On: 09/20/2021 17:10    EKG: My personal interpretation of EKG shows: NSR    Assessment/Plan Principal Problem:   COPD with acute  exacerbation (HCC) Active Problems:   Cocaine abuse, continuous (HCC)   Essential hypertension   Tobacco abuse   Chronic respiratory failure with hypoxia (HCC) - 2 L/min   Polysubstance use disorder    Assessment and Plan: * COPD with acute exacerbation (HCC) Admit to observation telemetry bed. Continue duonebs. Po prednisone. Pt just released from  [facility] 2 weeks ago for similar presentation of COPD exacerbation due to cocaine and tobacco abuse.  Cocaine abuse, continuous (HCC) Pt abuses cocaine on a regular basis. She does not wish to stop. Do not prescribe IV  Or PO opiates.  Prn clonidine for cocaine withdrawal.  Polysubstance use disorder Pt with known abuse of cocaine and marijuana. UDS pending.  Chronic respiratory failure with hypoxia (HCC) - 2 L/min Continue with supplemental O2.  Tobacco abuse Prn nicotine patch.  Essential hypertension Avoid betablockers due to pt's continuous and chronic cocaine abuse.   DVT prophylaxis: SQ Heparin Code Status: Full Code Family Communication: no family at bedside  Disposition Plan: return home  Consults called: none  Admission status: Observation, Telemetry bed   Carollee Herter, DO [facility] 09/20/2021, 10:28 PM

## 2021-09-20 NOTE — Assessment & Plan Note (Addendum)
Pt abuses cocaine on a regular basis. She does not wish to stop. Do not prescribe IV  Or PO opiates.  Prn clonidine for cocaine withdrawal.

## 2021-09-20 NOTE — Assessment & Plan Note (Signed)
Pt with known abuse of cocaine and marijuana. UDS pending.

## 2021-09-20 NOTE — Subjective & Objective (Signed)
CC: SOB HPI: 51 year old African-American female history of polysubstance abuse, chronic cocaine abuse, chronic opiate abuse, history of COPD, chronic respiratory failure on home oxygen at 2 L a minute, hypertension presents to the ER today with sudden onset of shortness of breath today.  Patient admits to using cocaine on a regular basis.  She was just released from [facility] 2 weeks ago for cocaine and tobacco induced COPD exacerbation.  Patient continues to abuse cocaine on a daily basis.  On arrival temp 98.5 heart rate 103 blood pressure 130/91.  Patient started on a continuous nebulizer treatment.    Laboratory evaluation was within normal limits.  BMP was normal.  Chest x-ray showed no acute cardiopulmonary disease.  Urine drug screen is pending.  Patient still wheezing.  [facility] hospitalist contacted for admission.

## 2021-09-20 NOTE — Assessment & Plan Note (Signed)
Avoid betablockers due to pt's continuous and chronic cocaine abuse.

## 2021-09-20 NOTE — Assessment & Plan Note (Signed)
Admit to observation telemetry bed. Continue duonebs. Po prednisone. Pt just released from [facility] 2 weeks ago for similar presentation of COPD exacerbation due to cocaine and tobacco abuse.

## 2021-09-21 DIAGNOSIS — Z720 Tobacco use: Secondary | ICD-10-CM

## 2021-09-21 DIAGNOSIS — E669 Obesity, unspecified: Secondary | ICD-10-CM

## 2021-09-21 LAB — CBC WITH DIFFERENTIAL/PLATELET
Abs Immature Granulocytes: 0.08 10*3/uL — ABNORMAL HIGH (ref 0.00–0.07)
Basophils Absolute: 0 10*3/uL (ref 0.0–0.1)
Basophils Relative: 0 %
Eosinophils Absolute: 0 10*3/uL (ref 0.0–0.5)
Eosinophils Relative: 0 %
HCT: 38.7 % (ref 36.0–46.0)
Hemoglobin: 13 g/dL (ref 12.0–15.0)
Immature Granulocytes: 1 %
Lymphocytes Relative: 5 %
Lymphs Abs: 0.6 10*3/uL — ABNORMAL LOW (ref 0.7–4.0)
MCH: 31.7 pg (ref 26.0–34.0)
MCHC: 33.6 g/dL (ref 30.0–36.0)
MCV: 94.4 fL (ref 80.0–100.0)
Monocytes Absolute: 1.1 10*3/uL — ABNORMAL HIGH (ref 0.1–1.0)
Monocytes Relative: 10 %
Neutro Abs: 9.6 10*3/uL — ABNORMAL HIGH (ref 1.7–7.7)
Neutrophils Relative %: 84 %
Platelets: 283 10*3/uL (ref 150–400)
RBC: 4.1 MIL/uL (ref 3.87–5.11)
RDW: 15.7 % — ABNORMAL HIGH (ref 11.5–15.5)
WBC: 11.4 10*3/uL — ABNORMAL HIGH (ref 4.0–10.5)
nRBC: 0 % (ref 0.0–0.2)

## 2021-09-21 LAB — RESPIRATORY PANEL BY PCR

## 2021-09-21 LAB — TROPONIN I (HIGH SENSITIVITY)
Troponin I (High Sensitivity): 4 ng/L (ref <18)
Troponin I (High Sensitivity): 5 ng/L (ref <18)

## 2021-09-21 LAB — COMPREHENSIVE METABOLIC PANEL
ALT: 14 U/L (ref 0–44)
AST: 20 U/L (ref 15–41)
Albumin: 3.9 g/dL (ref 3.5–5.0)
Alkaline Phosphatase: 71 U/L (ref 38–126)
Anion gap: 10 (ref 5–15)
BUN: 15 mg/dL (ref 6–20)
CO2: 25 mmol/L (ref 22–32)
Calcium: 9.3 mg/dL (ref 8.9–10.3)
Chloride: 103 mmol/L (ref 98–111)
Creatinine, Ser: 0.72 mg/dL (ref 0.44–1.00)
GFR, Estimated: >60 mL/min (ref >60)
Glucose, Bld: 236 mg/dL — ABNORMAL HIGH (ref 70–99)
Potassium: 4.4 mmol/L (ref 3.5–5.1)
Sodium: 138 mmol/L (ref 135–145)
Total Bilirubin: 0.2 mg/dL — ABNORMAL LOW (ref 0.3–1.2)
Total Protein: 7.3 g/dL (ref 6.5–8.1)

## 2021-09-21 LAB — PHOSPHORUS: Phosphorus: 3.1 mg/dL (ref 2.5–4.6)

## 2021-09-21 LAB — MAGNESIUM: Magnesium: 2.5 mg/dL — ABNORMAL HIGH (ref 1.7–2.4)

## 2021-09-21 MED ORDER — LEVALBUTEROL HCL 0.63 MG/3ML IN NEBU
0.6300 mg | INHALATION_SOLUTION | Freq: Four times a day (QID) | RESPIRATORY_TRACT | Status: DC
  Administered 2021-09-21 – 2021-09-23 (×5): 0.63 mg via RESPIRATORY_TRACT
  Filled 2021-09-21 (×9): qty 3

## 2021-09-21 MED ORDER — FLUOXETINE HCL 20 MG PO CAPS
40.0000 mg | ORAL_CAPSULE | Freq: Every morning | ORAL | Status: DC
  Administered 2021-09-21 – 2021-09-23 (×3): 40 mg via ORAL
  Filled 2021-09-21 (×3): qty 2

## 2021-09-21 MED ORDER — QUETIAPINE FUMARATE 100 MG PO TABS
100.0000 mg | ORAL_TABLET | Freq: Every day | ORAL | Status: DC
  Administered 2021-09-21 – 2021-09-22 (×2): 100 mg via ORAL
  Filled 2021-09-21 (×2): qty 1

## 2021-09-21 MED ORDER — DOXYCYCLINE HYCLATE 100 MG PO TABS
100.0000 mg | ORAL_TABLET | Freq: Two times a day (BID) | ORAL | Status: DC
Start: 2021-09-21 — End: 2021-09-23
  Administered 2021-09-21 – 2021-09-23 (×5): 100 mg via ORAL
  Filled 2021-09-21 (×5): qty 1

## 2021-09-21 MED ORDER — NICOTINE 21 MG/24HR TD PT24
21.0000 mg | MEDICATED_PATCH | Freq: Every day | TRANSDERMAL | Status: DC
  Administered 2021-09-22 – 2021-09-23 (×2): 21 mg via TRANSDERMAL
  Filled 2021-09-21 (×2): qty 1

## 2021-09-21 MED ORDER — ARFORMOTEROL TARTRATE 15 MCG/2ML IN NEBU
15.0000 ug | INHALATION_SOLUTION | Freq: Two times a day (BID) | RESPIRATORY_TRACT | Status: DC
  Administered 2021-09-21 – 2021-09-23 (×5): 15 ug via RESPIRATORY_TRACT
  Filled 2021-09-21 (×5): qty 2

## 2021-09-21 MED ORDER — ALBUTEROL SULFATE (2.5 MG/3ML) 0.083% IN NEBU: 2.5000 mg | INHALATION_SOLUTION | RESPIRATORY_TRACT | Status: DC | PRN

## 2021-09-21 MED ORDER — IPRATROPIUM BROMIDE 0.02 % IN SOLN
0.5000 mg | Freq: Four times a day (QID) | RESPIRATORY_TRACT | Status: DC
  Administered 2021-09-21 – 2021-09-23 (×6): 0.5 mg via RESPIRATORY_TRACT
  Filled 2021-09-21 (×8): qty 2.5

## 2021-09-21 MED ORDER — BUDESONIDE 0.25 MG/2ML IN SUSP
0.2500 mg | Freq: Two times a day (BID) | RESPIRATORY_TRACT | Status: DC
  Administered 2021-09-21 – 2021-09-23 (×5): 0.25 mg via RESPIRATORY_TRACT
  Filled 2021-09-21 (×5): qty 2

## 2021-09-21 MED ORDER — METHYLPREDNISOLONE SODIUM SUCC 125 MG IJ SOLR
120.0000 mg | INTRAMUSCULAR | Status: DC
Start: 2021-09-22 — End: 2021-09-23
  Administered 2021-09-22: 120 mg via INTRAVENOUS
  Filled 2021-09-21: qty 2

## 2021-09-21 MED ORDER — PNEUMOCOCCAL 20-VAL CONJ VACC 0.5 ML IM SUSY
0.5000 mL | PREFILLED_SYRINGE | INTRAMUSCULAR | Status: AC
  Administered 2021-09-22: 0.5 mL via INTRAMUSCULAR
  Filled 2021-09-21: qty 0.5

## 2021-09-21 MED ORDER — METHYLPREDNISOLONE SODIUM SUCC 40 MG IJ SOLR
40.0000 mg | Freq: Two times a day (BID) | INTRAMUSCULAR | Status: DC
  Administered 2021-09-21: 40 mg via INTRAVENOUS
  Filled 2021-09-21: qty 1

## 2021-09-21 MED ORDER — GUAIFENESIN ER 600 MG PO TB12
1200.0000 mg | ORAL_TABLET | Freq: Two times a day (BID) | ORAL | Status: DC
  Administered 2021-09-21 – 2021-09-23 (×5): 1200 mg via ORAL
  Filled 2021-09-21 (×5): qty 2

## 2021-09-21 MED ORDER — HYDROXYZINE HCL 25 MG PO TABS: 50.0000 mg | ORAL_TABLET | Freq: Three times a day (TID) | ORAL | Status: DC | PRN

## 2021-09-21 MED ORDER — PRAZOSIN HCL 1 MG PO CAPS
1.0000 mg | ORAL_CAPSULE | Freq: Every day | ORAL | Status: DC
  Administered 2021-09-21 – 2021-09-22 (×2): 1 mg via ORAL
  Filled 2021-09-21 (×3): qty 1

## 2021-09-21 NOTE — ED Notes (Signed)
ED TO INPATIENT HANDOFF REPORT  ED Nurse Name and Phone #: Redmond Pulling [phone]  S Name/Age/Gender Rachel Nolan 51 y.o. female Room/Bed: 038C/038C  Code Status   Code Status: Full Code  Home/SNF/Other Home Patient oriented to: self, place, time, and situation Is this baseline? Yes   Triage Complete: Triage complete  Chief Complaint COPD with acute exacerbation (HCC) [J44.1]  Triage Note Patient c/o shortness of breath and left sided chest pain onset of 15 mins ago. Patient smokes and has dry cough. Reports sharp stabbing pain to left chest and is non radiating.    Allergies No Known Allergies  Level of Care/Admitting Diagnosis ED Disposition     ED Disposition  Admit   Condition  --   Comment  Hospital Area: [facility] [[id number]]  Level of Care: Telemetry Medical [[id number]]  May admit patient to [name] or [facility] if equivalent level of care is available:: No  Covid Evaluation: Asymptomatic - no recent exposure (last 10 days) testing not required  Diagnosis: COPD with acute exacerbation [facility]) [[id number]]  Admitting Physician: Imogene Burn ERIC [[id number]]  Attending Physician: Marguerita Merles LATIF [phone]  Certification:: I certify this patient will need inpatient services for at least 2 midnights  Estimated Length of Stay: 2          B Medical/Surgery History Past Medical History:  Diagnosis Date   Anxiety    Asthma    COPD (chronic obstructive pulmonary disease) (HCC)    Depression    Hypertension    Scoliosis    Past Surgical History:  Procedure Laterality Date   BACK SURGERY     ECTOPIC PREGNANCY SURGERY       A IV Location/Drains/Wounds Patient Lines/Drains/Airways Status     Active Line/Drains/Airways     Name Placement date Placement time Site Days   Peripheral IV 09/20/21 22 G Anterior;Right Hand 09/20/21  1924  Hand  1            Intake/Output Last 24 hours  Intake/Output Summary (Last 24 hours) at 09/21/2021 1308 Last  data filed at 09/21/2021 0037 Gross per 24 hour  Intake 96.38 ml  Output --  Net 96.38 ml    Labs/Imaging Results for orders placed or performed during the hospital encounter of 09/20/21 (from the past 48 hour(s))  Basic metabolic panel     Status: None   Collection Time: 09/20/21  4:43 PM  Result Value Ref Range   Sodium 138 135 - 145 mmol/L   Potassium 3.9 3.5 - 5.1 mmol/L   Chloride 104 98 - 111 mmol/L   CO2 24 22 - 32 mmol/L   Glucose, Bld 84 70 - 99 mg/dL    Comment: Glucose reference range applies only to samples taken after fasting for at least 8 hours.   BUN 15 6 - 20 mg/dL   Creatinine, Ser 5.36 0.44 - 1.00 mg/dL   Calcium 9.6 8.9 - 64.4 mg/dL   GFR, Estimated >03 >47 mL/min    Comment: (NOTE) Calculated using the CKD-EPI Creatinine Equation (2021)    Anion gap 10 5 - 15    Comment: Performed at [facility], [address], [place], [place] [address]  CBC     Status: None   Collection Time: 09/20/21  4:43 PM  Result Value Ref Range   WBC 7.4 4.0 - 10.5 K/uL   RBC 3.98 3.87 - 5.11 MIL/uL   Hemoglobin 12.4 12.0 - 15.0 g/dL   HCT 63.8 75.6 -  46.0 %   MCV 91.5 80.0 - 100.0 fL   MCH 31.2 26.0 - 34.0 pg   MCHC 34.1 30.0 - 36.0 g/dL   RDW 16.1 09.6 - 04.5 %   Platelets 292 150 - 400 K/uL   nRBC 0.0 0.0 - 0.2 %    Comment: Performed at [facility], [address] [place], [place] [address]  Troponin I (High Sensitivity)     Status: None   Collection Time: 09/20/21  4:43 PM  Result Value Ref Range   Troponin I (High Sensitivity) 8 <18 ng/L    Comment: (NOTE) Elevated high sensitivity troponin I (hsTnI) values and significant  changes across serial measurements may suggest ACS but many other  chronic and acute conditions are known to elevate hsTnI results.  Refer to the "Links" section for chest pain algorithms and additional  guidance. Performed at [facility], [address], [place], [place] [address]   Brain natriuretic peptide     Status:  None   Collection Time: 09/20/21  4:43 PM  Result Value Ref Range   B Natriuretic Peptide 14.1 0.0 - 100.0 pg/mL    Comment: Performed at [facility], [address], [place], [place] [address]  I-Stat beta hCG blood, ED     Status: None   Collection Time: 09/20/21  5:14 PM  Result Value Ref Range   I-stat hCG, quantitative <5.0 <5 mIU/mL   Comment 3            Comment:   GEST. AGE      CONC.  (mIU/mL)   <=1 WEEK        5 - 50     2 WEEKS       50 - 500     3 WEEKS       100 - 10,000     4 WEEKS     1,000 - 30,000        FEMALE AND NON-PREGNANT FEMALE:     LESS THAN 5 mIU/mL   Troponin I (High Sensitivity)     Status: None   Collection Time: 09/20/21  6:04 PM  Result Value Ref Range   Troponin I (High Sensitivity) 8 <18 ng/L    Comment: (NOTE) Elevated high sensitivity troponin I (hsTnI) values and significant  changes across serial measurements may suggest ACS but many other  chronic and acute conditions are known to elevate hsTnI results.  Refer to the "Links" section for chest pain algorithms and additional  guidance. Performed at [facility], [address], [place], [place] [address]   Rapid urine drug screen (hospital performed)     Status: Abnormal   Collection Time: 09/20/21  9:24 PM  Result Value Ref Range   Opiates NONE DETECTED NONE DETECTED   Cocaine POSITIVE (A) NONE DETECTED   Benzodiazepines NONE DETECTED NONE DETECTED   Amphetamines NONE DETECTED NONE DETECTED   Tetrahydrocannabinol NONE DETECTED NONE DETECTED   Barbiturates NONE DETECTED NONE DETECTED    Comment: (NOTE) DRUG SCREEN FOR MEDICAL PURPOSES ONLY.  IF CONFIRMATION IS NEEDED FOR ANY PURPOSE, NOTIFY LAB WITHIN 5 DAYS.  LOWEST DETECTABLE LIMITS FOR URINE DRUG SCREEN Drug Class                     Cutoff (ng/mL) Amphetamine and metabolites    1000 Barbiturate and metabolites    200 Benzodiazepine  200 Tricyclics and metabolites     300 Opiates and metabolites         300 Cocaine and metabolites        300 THC                            50 Performed at [facility], [address] [place], [place] [address]   CBC with Differential/Platelet     Status: Abnormal   Collection Time: 09/21/21 10:22 AM  Result Value Ref Range   WBC 11.4 (H) 4.0 - 10.5 K/uL   RBC 4.10 3.87 - 5.11 MIL/uL   Hemoglobin 13.0 12.0 - 15.0 g/dL   HCT 82.938.7 56.236.0 - 13.046.0 %   MCV 94.4 80.0 - 100.0 fL   MCH 31.7 26.0 - 34.0 pg   MCHC 33.6 30.0 - 36.0 g/dL   RDW 86.515.7 (H) 78.411.5 - 69.615.5 %   Platelets 283 150 - 400 K/uL   nRBC 0.0 0.0 - 0.2 %   Neutrophils Relative % 84 %   Neutro Abs 9.6 (H) 1.7 - 7.7 K/uL   Lymphocytes Relative 5 %   Lymphs Abs 0.6 (L) 0.7 - 4.0 K/uL   Monocytes Relative 10 %   Monocytes Absolute 1.1 (H) 0.1 - 1.0 K/uL   Eosinophils Relative 0 %   Eosinophils Absolute 0.0 0.0 - 0.5 K/uL   Basophils Relative 0 %   Basophils Absolute 0.0 0.0 - 0.1 K/uL   Immature Granulocytes 1 %   Abs Immature Granulocytes 0.08 (H) 0.00 - 0.07 K/uL    Comment: Performed at [facility], [address] [place], [place] [address]  Comprehensive metabolic panel     Status: Abnormal   Collection Time: 09/21/21 10:22 AM  Result Value Ref Range   Sodium 138 135 - 145 mmol/L   Potassium 4.4 3.5 - 5.1 mmol/L   Chloride 103 98 - 111 mmol/L   CO2 25 22 - 32 mmol/L   Glucose, Bld 236 (H) 70 - 99 mg/dL    Comment: Glucose reference range applies only to samples taken after fasting for at least 8 hours.   BUN 15 6 - 20 mg/dL   Creatinine, Ser 4.130.72 0.44 - 1.00 mg/dL   Calcium 9.3 8.9 - 24.410.3 mg/dL   Total Protein 7.3 6.5 - 8.1 g/dL   Albumin 3.9 3.5 - 5.0 g/dL   AST 20 15 - 41 U/L   ALT 14 0 - 44 U/L   Alkaline Phosphatase 71 38 - 126 U/L   Total Bilirubin 0.2 (L) 0.3 - 1.2 mg/dL   GFR, Estimated >01>60 >02>60 mL/min    Comment: (NOTE) Calculated using the CKD-EPI Creatinine Equation (2021)    Anion gap 10 5 - 15    Comment: Performed at [facility], [address] [place], [place] [address]  Magnesium     Status: Abnormal   Collection Time: 09/21/21 10:22 AM  Result Value Ref Range   Magnesium 2.5 (H) 1.7 - 2.4 mg/dL    Comment: Performed at [facility], [address] [place], [place] [address]  Phosphorus     Status: None   Collection Time: 09/21/21 10:22 AM  Result Value Ref Range   Phosphorus 3.1 2.5 - 4.6 mg/dL    Comment: Performed at [facility], [address] [place], [place] [address]  Troponin I (High Sensitivity)     Status: None  Collection Time: 09/21/21 10:22 AM  Result Value Ref Range   Troponin I (High Sensitivity) 4 <18 ng/L    Comment: (NOTE) Elevated high sensitivity troponin I (hsTnI) values and significant  changes across serial measurements may suggest ACS but many other  chronic and acute conditions are known to elevate hsTnI results.  Refer to the "Links" section for chest pain algorithms and additional  guidance. Performed at [facility], [address], [place], [place] [address]    DG Chest 2 View  Result Date: 09/20/2021 CLINICAL DATA:  Chest pain, shortness of breath EXAM: CHEST - 2 VIEW COMPARISON:  08/21/2021 FINDINGS: Cardiac size is within normal limits. There are no new infiltrates or signs of pulmonary edema. There is no pleural effusion or pneumothorax. Harrington rod is seen in thoracolumbar spine. IMPRESSION: There are no new infiltrates or signs of pulmonary edema. Electronically Signed   By: Ernie Avena M.D.   On: 09/20/2021 17:10    Pending Labs Unresulted Labs (From admission, onward)     Start     Ordered   09/22/21 0500  CBC with Differential/Platelet  Tomorrow morning,   R        09/21/21 1053   09/22/21 0500  Comprehensive metabolic panel  Tomorrow morning,   R        09/21/21 1053   09/22/21 0500  Phosphorus  Tomorrow morning,   R        09/21/21 1053   09/22/21 0500  Magnesium  Tomorrow morning,   R        09/21/21 1053   09/21/21 1045  Respiratory (~20  pathogens) panel by PCR  (Respiratory panel by PCR (~20 pathogens, ~24 hr TAT)  w precautions)  Once,   R        09/21/21 1044            Vitals/Pain Today's Vitals   09/21/21 1130 09/21/21 1140 09/21/21 1200 09/21/21 1230  BP: 101/69  117/68 114/82  Pulse: 61  69 65  Resp: 17  (!) 21 19  Temp:      TempSrc:      SpO2: 100%  100% 100%  Weight:      Height:      PainSc:  Asleep      Isolation Precautions Droplet precaution  Medications Medications  albuterol (PROVENTIL) (2.5 MG/3ML) 0.083% nebulizer solution (0 mg/hr Nebulization Stopped 09/20/21 2241)  amLODipine (NORVASC) tablet 10 mg (10 mg Oral Given 09/21/21 0914)  heparin injection 5,000 Units (5,000 Units Subcutaneous Given 09/21/21 0516)  acetaminophen (TYLENOL) tablet 650 mg (650 mg Oral Given 09/21/21 0745)    Or  acetaminophen (TYLENOL) suppository 650 mg ( Rectal See Alternative 09/21/21 0745)  promethazine (PHENERGAN) tablet 12.5 mg (has no administration in time range)  cloNIDine (CATAPRES) tablet 0.1 mg (has no administration in time range)  albuterol (PROVENTIL) (2.5 MG/3ML) 0.083% nebulizer solution (0 mg/hr Nebulization Hold 09/20/21 2359)  FLUoxetine (PROZAC) capsule 40 mg (40 mg Oral Given 09/21/21 1016)  hydrOXYzine (ATARAX) tablet 50 mg (has no administration in time range)  QUEtiapine (SEROQUEL) tablet 100 mg (has no administration in time range)  prazosin (MINIPRESS) capsule 1 mg (has no administration in time range)  nicotine (NICODERM CQ - dosed in mg/24 hours) patch 21 mg (21 mg Transdermal Not Given 09/21/21 1005)  guaiFENesin (MUCINEX) 12 hr tablet 1,200 mg (1,200 mg Oral Given 09/21/21 1016)  albuterol (PROVENTIL) (2.5 MG/3ML) 0.083% nebulizer solution 2.5 mg (has no administration in time  range)  methylPREDNISolone sodium succinate (SOLU-MEDROL) 125 mg/2 mL injection 120 mg (has no administration in time range)  arformoterol (BROVANA) nebulizer solution 15 mcg (15 mcg Nebulization Given 09/21/21 1147)   budesonide (PULMICORT) nebulizer solution 0.25 mg (0.25 mg Nebulization Given 09/21/21 1147)  levalbuterol (XOPENEX) nebulizer solution 0.63 mg (0.63 mg Nebulization Given 09/21/21 1147)  ipratropium (ATROVENT) nebulizer solution 0.5 mg (0.5 mg Nebulization Given 09/21/21 1147)  doxycycline (VIBRA-TABS) tablet 100 mg (100 mg Oral Given 09/21/21 1146)  ipratropium-albuterol (DUONEB) 0.5-2.5 (3) MG/3ML nebulizer solution 3 mL (3 mLs Nebulization Given 09/20/21 1640)  methylPREDNISolone sodium succinate (SOLU-MEDROL) 125 mg/2 mL injection 125 mg (125 mg Intravenous Given 09/20/21 1933)  albuterol (PROVENTIL) (2.5 MG/3ML) 0.083% nebulizer solution (10 mg/hr Nebulization Given 09/20/21 1819)  ipratropium-albuterol (DUONEB) 0.5-2.5 (3) MG/3ML nebulizer solution 3 mL (3 mLs Nebulization Given 09/20/21 1819)  magnesium sulfate IVPB 2 g 50 mL (0 g Intravenous Stopped 09/20/21 2057)  magnesium sulfate IVPB 2 g 50 mL (0 g Intravenous Stopped 09/21/21 0037)    Mobility walks with device Moderate fall risk   Focused Assessments Pulmonary Assessment Handoff:  Lung sounds: Bilateral Breath Sounds: Expiratory wheezes O2 Device: Nasal Cannula O2 Flow Rate (L/min): 3 L/min    R Recommendations: See Admitting Provider Note  Report given to:   Additional Notes:

## 2021-09-21 NOTE — Progress Notes (Signed)
PROGRESS NOTE    Rachel Nolan  [id number] DOB: [birth year] DOA: 09/20/2021 PCP: Pcp, No   Brief Narrative:  The patient is a 51 year old obese African-American female with a past medical history significant for but not limited to polysubstance abuse, chronic cocaine abuse, chronic opiate abuse, history of COPD, history of chronic respiratory failure on 2 L of supplemental oxygen via nasal cannula continuously, hypertension, depression and anxiety as well as other comorbidities presents to the ED with sudden shortness of breath yesterday.  She admitted to using cocaine on a regular basis and was just released from [facility] about 2 weeks ago for cocaine and tobacco induced COPD exacerbation.  She continues to abuse cocaine on a daily basis and upon arrival to the ED she was noted to have a slightly elevated heart rate.  She was given a continuous nebulizer treatment and labs were obtained and within normal limits.  Chest x-ray done and showed no acute cardiopulmonary disease.  UDS was positive for cocaine.  Given the patient continued to wheeze, the hospitalist was called contacted for admission and she is admitted for acute on chronic respiratory failure with hypoxia in the setting of acute exacerbation of COPD.  Assessment and Plan: * COPD with acute exacerbation ([place]) History of asthma -Admit to observation telemetry bed.  -Continue duonebs we will change to Xopenex and Atrovent scheduled every 6 hours.  -Po prednisone will be changed back to IV Solu-Medrol 60 mg acute 12 given her continued wheezing.  -Pt just released from [facility] 2 weeks ago for similar presentation of COPD exacerbation due to cocaine and tobacco abuse. -We will add Brovana and budesonide nebs as well -We will need incentive spirometry, flutter valve and guaifenesin 12 1 mg p.o. twice daily -Smoking cessation counseling as below -She will need a repeat chest x-ray in a.m. and will obtain a respiratory virus  panel and place on droplet precautions; of note she did not have a COVID test when she came in to order this now -DG chest x-ray done and showed "Cardiac size is within normal limits. There are no new infiltrates or signs of pulmonary edema. There is no pleural effusion or pneumothorax. Harrington rod is seen in thoracolumbar spine." -We will add Doxycycline 100 mg p.o. twice daily for 5 days   Cocaine abuse, continuous (HCC) -Pt abuses cocaine on a regular basis.  -She does not wish to stop.  -We will not prescribe IV  Or PO opiates and have further judicious use of analgesics.   -We will provide Prn clonidine for cocaine withdrawal and give every 4 hours as needed for systolic blood pressure greater than [id number] or diastolic blood pressure greater than 100.  Polysubstance use disorder -Pt with known abuse of cocaine and marijuana. -UDS POSITIVE for Cocaine  -Counseling given  Acute on chronic respiratory failure with hypoxia (HCC) - 2 L/min -Continue with supplemental O2.  Normally wears 2 L but is up to 3 today -SpO2: 100 % O2 Flow Rate (L/min): 3 L/min -We will need continuous pulse oximetry and maintain O2 saturations greater than 90% -Continue supplemental oxygen via nasal cannula and wean O2 as tolerated to her home oxygen requirements -Treatment as above  Tobacco Abuse -Smoking cessation counseling given -Continue nicotine patch 20 mg transdermally every 24  Essential Hypertension -Avoid betablockers due to pt's continuous and chronic cocaine abuse. -Continue to monitor blood pressures per protocol -Last blood pressure reading was 111/83  Chest Discomfort, rule out ACS -Likely in the setting of  her COPD exacerbation but also has cocaine use so we will need to rule out ACS -Check troponins x2 again with the last few troponins have been flat negative at 8 -Continue telemetry monitoring -We will get an EKG -If necessary will provide some morphine and some sublingual  nitroglycerin however suspect her chest discomfort is from her COPD exacerbation  Obesity -Complicates overall prognosis and care -Estimated body mass index is 30.67 kg/m as calculated from the following:   Height as of this encounter: 5\' 6"  (1.676 m).   Weight as of this encounter: 86.2 kg.  -Weight Loss and Dietary Counseling given  Depression and Anxiety -Continue with home medications including quetiapine 100 mg p.o. nightly, prazosin 1 mg p.o. nightly, and fluoxetine 40 mg p.o. every morning -We will also resume her hydroxyzine 50 mg p.o. 3 times daily as needed anxiety  DVT prophylaxis: heparin injection 5,000 Units Start: 09/20/21 2245 SCDs Start: 09/20/21 2236    Code Status: Full Code Family Communication: No family currently at bedside  Disposition Plan:  Level of care: Telemetry Medical Status is: Observation The patient will require care spanning > 2 midnights and should be moved to inpatient because: She continues to wheezes and will likely need further treatment for her COPD exacerbation given that we have changed her steroids to IV   Consultants:  None  Procedures:  As above  Antimicrobials:  Anti-infectives (From admission, onward)    None       Subjective: Patient was seen and examined at bedside and she continues to have significant amount of wheezing.  She states that she still feels short of breath and was complaining of some chest discomfort.  States that somebody had Klonopin which made her asthma flare and made her almost want to vomit and made her feel nauseous.  Denies any lightheadedness.  No other concerns or complaints at this time.  Objective: Vitals:   09/21/21 0916 09/21/21 0930 09/21/21 0945 09/21/21 1000  BP:  117/86  111/83  Pulse:  97 73 74  Resp:  (!) 29 16 16   Temp: 97.9 F (36.6 C)     TempSrc: Oral     SpO2:  100% 99% 100%  Weight:      Height:        Intake/Output Summary (Last 24 hours) at 09/21/2021 1032 Last data filed  at 09/21/2021 0037 Gross per 24 hour  Intake 96.38 ml  Output --  Net 96.38 ml   Filed Weights   09/20/21 1635  Weight: 86.2 kg   Examination: Physical Exam:  Constitutional: WN/WD obese African-American female currently mild distress continuously wheezing and appears anxious Respiratory: Diminished to auscultation bilaterally with coarse breath sounds and noted expiratory wheezing worse on the right compared to left.  No appreciable rales, rhonchi or crackles.  Has a slightly increased respiratory effort but has no accessory muscle use.  Has 3 L of supplemental oxygen via nasal cannula Cardiovascular: RRR, no murmurs / rubs / gallops. S1 and S2 auscultated.  Trace extremity edema Abdomen: Soft, non-tender, distended secondary body habitus.  Bowel sounds positive.  GU: Deferred. Musculoskeletal: No clubbing / cyanosis of digits/nails. No joint deformity upper and lower extremities on limited exam.   Skin: No rashes, lesions, ulcers on limited skin evaluation. No induration; Warm and dry.  Neurologic: CN 2-12 grossly intact with no focal deficits. Romberg sign and cerebellar reflexes not assessed.  Psychiatric: Normal judgment and insight. Alert and oriented x 3.  Slightly anxious mood and appropriate affect.  Data Reviewed: I have personally reviewed following labs and imaging studies  CBC: Recent Labs  Lab 09/20/21 1643  WBC 7.4  HGB 12.4  HCT 36.4  MCV 91.5  PLT 292   Basic Metabolic Panel: Recent Labs  Lab 09/20/21 1643  NA 138  K 3.9  CL 104  CO2 24  GLUCOSE 84  BUN 15  CREATININE 0.70  CALCIUM 9.6   GFR: Estimated Creatinine Clearance: 91 mL/min (by C-G formula based on SCr of 0.7 mg/dL). Liver Function Tests: No results for input(s): "AST", "ALT", "ALKPHOS", "BILITOT", "PROT", "ALBUMIN" in the last 168 hours. No results for input(s): "LIPASE", "AMYLASE" in the last 168 hours. No results for input(s): "AMMONIA" in the last 168 hours. Coagulation Profile: No  results for input(s): "INR", "PROTIME" in the last 168 hours. Cardiac Enzymes: No results for input(s): "CKTOTAL", "CKMB", "CKMBINDEX", "TROPONINI" in the last 168 hours. BNP (last 3 results) No results for input(s): "PROBNP" in the last 8760 hours. HbA1C: No results for input(s): "HGBA1C" in the last 72 hours. CBG: No results for input(s): "GLUCAP" in the last 168 hours. Lipid Profile: No results for input(s): "CHOL", "HDL", "LDLCALC", "TRIG", "CHOLHDL", "LDLDIRECT" in the last 72 hours. Thyroid Function Tests: No results for input(s): "TSH", "T4TOTAL", "FREET4", "T3FREE", "THYROIDAB" in the last 72 hours. Anemia Panel: No results for input(s): "VITAMINB12", "FOLATE", "FERRITIN", "TIBC", "IRON", "RETICCTPCT" in the last 72 hours. Sepsis Labs: No results for input(s): "PROCALCITON", "LATICACIDVEN" in the last 168 hours.  No results found for this or any previous visit (from the past 240 hour(s)).   Radiology Studies: DG Chest 2 View  Result Date: 09/20/2021 CLINICAL DATA:  Chest pain, shortness of breath EXAM: CHEST - 2 VIEW COMPARISON:  08/21/2021 FINDINGS: Cardiac size is within normal limits. There are no new infiltrates or signs of pulmonary edema. There is no pleural effusion or pneumothorax. Harrington rod is seen in thoracolumbar spine. IMPRESSION: There are no new infiltrates or signs of pulmonary edema. Electronically Signed   By: Ernie Avena M.D.   On: 09/20/2021 17:10     Scheduled Meds:  albuterol  10 mg/hr Nebulization Once   amLODipine  10 mg Oral Daily   FLUoxetine  40 mg Oral q morning   guaiFENesin  1,200 mg Oral BID   heparin  5,000 Units Subcutaneous Q8H   ipratropium-albuterol  3 mL Nebulization Q6H   methylPREDNISolone (SOLU-MEDROL) injection  40 mg Intravenous Q12H   nicotine  21 mg Transdermal Daily   prazosin  1 mg Oral QHS   QUEtiapine  100 mg Oral QHS   Continuous Infusions:   LOS: 0 days   Marguerita Merles, DO [facility] Available via  Epic secure chat 7am-7pm After these hours, please refer to coverage provider listed on [email or web address] 09/21/2021, 10:32 AM

## 2021-09-21 NOTE — Plan of Care (Signed)
  Problem: Education: Goal: Knowledge of disease or condition will improve Outcome: Progressing   Problem: Education: Goal: Individualized Educational Video(s) Outcome: Progressing   Problem: Activity: Goal: Ability to tolerate increased activity will improve Outcome: Progressing

## 2021-09-22 ENCOUNTER — Inpatient Hospital Stay (HOSPITAL_COMMUNITY): Payer: Self-pay

## 2021-09-22 NOTE — Progress Notes (Signed)
PROGRESS NOTE    Rachel Nolan  TML:[id number] DOB: June 25, 1970 DOA: 09/20/2021 PCP: Pcp, No   Brief Narrative:  The patient is a 51 year old obese African-American female with a past medical history significant for but not limited to polysubstance abuse, chronic cocaine abuse, chronic opiate abuse, history of COPD, history of chronic respiratory failure on 2 L of supplemental oxygen via nasal cannula continuously, hypertension, depression and anxiety as well as other comorbidities presents to the ED with sudden shortness of breath yesterday.  She admitted to using cocaine on a regular basis and was just released from [facility] about 2 weeks ago for cocaine and tobacco induced COPD exacerbation.  She continues to abuse cocaine on a daily basis and upon arrival to the ED she was noted to have a slightly elevated heart rate.  She was given a continuous nebulizer treatment and labs were obtained and within normal limits.  Chest x-ray done and showed no acute cardiopulmonary disease.  UDS was positive for cocaine.  Given the patient continued to wheeze, the hospitalist was called contacted for admission and she is admitted for acute on chronic respiratory failure with hypoxia in the setting of acute exacerbation of COPD.  Assessment and Plan: COPD with acute exacerbation (HCC), Improving slowly  History of asthma -Admit to observation telemetry bed.  -Continue duonebs we will change to Xopenex and Atrovent scheduled every 6 hours.  -Po prednisone will be changed back to IV Solu-Medrol 60 mg q 12  yesterday given her continued wheezing and will start weaning  -Pt just released from [facility] 2 weeks ago for similar presentation of COPD exacerbation due to cocaine and tobacco abuse. -We will add Brovana and budesonide nebs as well -We will need incentive spirometry, flutter valve and guaifenesin 1200 mg p.o. twice daily -Smoking cessation counseling as below -Respiratory virus panel was  negative and repeat chest x-ray today showed "Left base atelectasis or infiltrate. No other acute cardiopulmonary findings." -We will continue Doxycycline 100 mg p.o. twice daily for 5 days give Left Sided ATX vs Infiltrate    Cocaine abuse, continuous (HCC) -Pt abuses cocaine on a regular basis.  -She does not wish to stop.  -We will not prescribe IV Or PO opiates and have further judicious use of analgesics.   -We will provide Prn clonidine for cocaine withdrawal and give every 4 hours as needed for systolic blood pressure greater than 170 or diastolic blood pressure greater than 100.   Polysubstance Use Disorder -Pt with known abuse of cocaine and marijuana.  -UDS POSITIVE for Cocaine  -Counseling given   Acute on chronic respiratory failure with hypoxia (HCC) - 2 L/min -Continue with supplemental O2.  Normally wears 2 L but is up to 3 today -SpO2: 94 % O2 Flow Rate (L/min): 2 L/min; O2 requirement is improving  -We will need continuous pulse oximetry and maintain O2 saturations greater than 90% -Continue supplemental oxygen via nasal cannula and wean O2 as tolerated to her home oxygen requirements -Treatment as above  Leukocytosis -Patient's WBC went from 7.4 -> 11.4 -In the setting of Steroid Demargination -Continue to Monitor and Trend -Repeat CBC in the AM    Tobacco Abuse -Smoking cessation counseling given -Continue nicotine patch 20 mg transdermally every 24   Essential Hypertension -Avoid betablockers due to pt's continuous and chronic cocaine abuse. -Continue to monitor blood pressures per protocol -Last blood pressure reading was on the slower side and is now 96/58   Chest Discomfort, rule out ACS -Likely  in the setting of her COPD exacerbation but also has cocaine use so we will need to rule out ACS -Troponin Flat and Negative and went from 8 -> 4 -> 5 -Continue telemetry monitoring -Repeat EKG for any recurrent CP -If necessary will provide some morphine and  some sublingual nitroglycerin however suspect her chest discomfort is from her COPD exacerbation  Hyperglycemia -In the Setting of Steroid Demargination -Patient's Blood Sugar ranging from 84-236 -Check HbA1c in the AM -Continue to Monitor BS Carefully and may need to place on Sensitive Novolog AC   Obesity -Complicates overall prognosis and care -Estimated body mass index is 30.67 kg/m as calculated from the following:   Height as of this encounter: 5\' 6"  (1.676 m).   Weight as of this encounter: 86.2 kg.  -Weight Loss and Dietary Counseling given   Depression and Anxiety -Continue with home medications including quetiapine 100 mg p.o. nightly, prazosin 1 mg p.o. nightly, and fluoxetine 40 mg p.o. every morning -We will also resume her hydroxyzine 50 mg p.o. 3 times daily as needed anxiety  DVT prophylaxis: heparin injection 5,000 Units Start: 09/20/21 2245 SCDs Start: 09/20/21 2236    Code Status: Full Code Family Communication: No family currently at bedside  Disposition Plan:  Level of care: Telemetry Medical Status is: Inpatient Remains inpatient appropriate because: Continues of COPD exacerbation and is slowly improving and will need at least a day or 2 to further improve   Consultants:  None  Procedures:  As above  Antimicrobials:  Anti-infectives (From admission, onward)    Start     Dose/Rate Route Frequency Ordered Stop   09/21/21 1130  doxycycline (VIBRA-TABS) tablet 100 mg        100 mg Oral Every 12 hours 09/21/21 1049 09/26/21 0959       Subjective: Seen and examined at bedside and she thinks she is doing little bit better and slowly improving.  Thinks she is about 30% better.  Was taken off of oxygen and was asking where it went.  No chest pain.  Denies any abdominal discomfort.  No other concerns or complaints this time.  Objective: Vitals:   09/22/21 0720 09/22/21 0722 09/22/21 0800 09/22/21 1412  BP:      Pulse:   87 93  Resp:   16 18  Temp:       TempSrc:      SpO2: 95% 95% 94% 94%  Weight:      Height:       No intake or output data in the 24 hours ending 09/22/21 1604 Filed Weights   09/20/21 1635  Weight: 86.2 kg   Examination: Physical Exam:  Constitutional: WN/WD obese African-American female currently no acute distress Respiratory: Diminished to auscultation bilaterally with coarse breath sounds and has some expiratory wheezing in the left compared to right today.  No appreciable rales or crackles but does have some rhonchi. Normal respiratory effort and patient is not tachypenic. No accessory muscle use.  Unlabored breathing and was pulled off for supplemental oxygen Cardiovascular: RRR, no murmurs / rubs / gallops. S1 and S2 auscultated. No carotid bruits.  Abdomen: Soft, non-tender, distended secondary body habitus. Bowel sounds positive.  GU: Deferred. Musculoskeletal: No clubbing / cyanosis of digits/nails. No joint deformity upper and lower extremities.  Skin: No rashes, lesions, ulcers on limited skin evaluation. No induration; Warm and dry.  Neurologic: CN 2-12 grossly intact with no focal deficits. Romberg sign and cerebellar reflexes not assessed.  Psychiatric: Normal judgment and  insight. Alert and oriented x 3. Normal mood and appropriate affect.   Data Reviewed: I have personally reviewed following labs and imaging studies  CBC: Recent Labs  Lab 09/20/21 1643 09/21/21 1022  WBC 7.4 11.4*  NEUTROABS  --  9.6*  HGB 12.4 13.0  HCT 36.4 38.7  MCV 91.5 94.4  PLT 292 283   Basic Metabolic Panel: Recent Labs  Lab 09/20/21 1643 09/21/21 1022  NA 138 138  K 3.9 4.4  CL 104 103  CO2 24 25  GLUCOSE 84 236*  BUN 15 15  CREATININE 0.70 0.72  CALCIUM 9.6 9.3  MG  --  2.5*  PHOS  --  3.1   GFR: Estimated Creatinine Clearance: 91 mL/min (by C-G formula based on SCr of 0.72 mg/dL). Liver Function Tests: Recent Labs  Lab 09/21/21 1022  AST 20  ALT 14  ALKPHOS 71  BILITOT 0.2*  PROT 7.3   ALBUMIN 3.9   No results for input(s): "LIPASE", "AMYLASE" in the last 168 hours. No results for input(s): "AMMONIA" in the last 168 hours. Coagulation Profile: No results for input(s): "INR", "PROTIME" in the last 168 hours. Cardiac Enzymes: No results for input(s): "CKTOTAL", "CKMB", "CKMBINDEX", "TROPONINI" in the last 168 hours. BNP (last 3 results) No results for input(s): "PROBNP" in the last 8760 hours. HbA1C: No results for input(s): "HGBA1C" in the last 72 hours. CBG: No results for input(s): "GLUCAP" in the last 168 hours. Lipid Profile: No results for input(s): "CHOL", "HDL", "LDLCALC", "TRIG", "CHOLHDL", "LDLDIRECT" in the last 72 hours. Thyroid Function Tests: No results for input(s): "TSH", "T4TOTAL", "FREET4", "T3FREE", "THYROIDAB" in the last 72 hours. Anemia Panel: No results for input(s): "VITAMINB12", "FOLATE", "FERRITIN", "TIBC", "IRON", "RETICCTPCT" in the last 72 hours. Sepsis Labs: No results for input(s): "PROCALCITON", "LATICACIDVEN" in the last 168 hours.  Recent Results (from the past 240 hour(s))  Respiratory (~20 pathogens) panel by PCR     Status: None   Collection Time: 09/21/21 10:45 AM   Specimen: Nasopharyngeal Swab; Respiratory  Result Value Ref Range Status   Adenovirus NOT DETECTED NOT DETECTED Final   Coronavirus 229E NOT DETECTED NOT DETECTED Final    Comment: (NOTE) The Coronavirus on the Respiratory Panel, DOES NOT test for the novel  Coronavirus (2019 nCoV)    Coronavirus HKU1 NOT DETECTED NOT DETECTED Final   Coronavirus NL63 NOT DETECTED NOT DETECTED Final   Coronavirus OC43 NOT DETECTED NOT DETECTED Final   Metapneumovirus NOT DETECTED NOT DETECTED Final   Rhinovirus / Enterovirus NOT DETECTED NOT DETECTED Final   Influenza A NOT DETECTED NOT DETECTED Final   Influenza B NOT DETECTED NOT DETECTED Final   Parainfluenza Virus 1 NOT DETECTED NOT DETECTED Final   Parainfluenza Virus 2 NOT DETECTED NOT DETECTED Final   Parainfluenza  Virus 3 NOT DETECTED NOT DETECTED Final   Parainfluenza Virus 4 NOT DETECTED NOT DETECTED Final   Respiratory Syncytial Virus NOT DETECTED NOT DETECTED Final   Bordetella pertussis NOT DETECTED NOT DETECTED Final   Bordetella Parapertussis NOT DETECTED NOT DETECTED Final   Chlamydophila pneumoniae NOT DETECTED NOT DETECTED Final   Mycoplasma pneumoniae NOT DETECTED NOT DETECTED Final    Comment: Performed at [facility], [address] [place], [place] [address]    Radiology Studies: DG CHEST PORT 1 VIEW  Result Date: 09/22/2021 CLINICAL DATA:  Shortness of breath. EXAM: PORTABLE CHEST 1 VIEW COMPARISON:  09/20/2021 FINDINGS: Atelectasis or infiltrate noted left base. Right lung clear. Cardiopericardial silhouette is  at upper limits of normal for size. Similar fullness in both hilar regions presumably vascular anatomy. Thoracolumbar fusion rod evident. IMPRESSION: Left base atelectasis or infiltrate. No other acute cardiopulmonary findings. Electronically Signed   By: Kennith Center M.D.   On: 09/22/2021 06:04   DG Chest 2 View  Result Date: 09/20/2021 CLINICAL DATA:  Chest pain, shortness of breath EXAM: CHEST - 2 VIEW COMPARISON:  08/21/2021 FINDINGS: Cardiac size is within normal limits. There are no new infiltrates or signs of pulmonary edema. There is no pleural effusion or pneumothorax. Harrington rod is seen in thoracolumbar spine. IMPRESSION: There are no new infiltrates or signs of pulmonary edema. Electronically Signed   By: Ernie Avena M.D.   On: 09/20/2021 17:10    Scheduled Meds:  albuterol  10 mg/hr Nebulization Once   amLODipine  10 mg Oral Daily   arformoterol  15 mcg Nebulization BID   budesonide (PULMICORT) nebulizer solution  0.25 mg Nebulization BID   doxycycline  100 mg Oral Q12H   FLUoxetine  40 mg Oral q morning   guaiFENesin  1,200 mg Oral BID   heparin  5,000 Units Subcutaneous Q8H   ipratropium  0.5 mg Nebulization Q6H   levalbuterol  0.63 mg  Nebulization Q6H   methylPREDNISolone (SOLU-MEDROL) injection  120 mg Intravenous Q24H   nicotine  21 mg Transdermal Daily   prazosin  1 mg Oral QHS   QUEtiapine  100 mg Oral QHS   Continuous Infusions:   LOS: 1 day   Marguerita Merles, DO [facility] Available via Epic secure chat 7am-7pm After these hours, please refer to coverage provider listed on [email or web address] 09/22/2021, 4:04 PM
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
## 2021-09-22 NOTE — Plan of Care (Signed)
  Problem: Education: Goal: Individualized Educational Video(s) Outcome: Progressing   Problem: Activity: Goal: Ability to tolerate increased activity will improve Outcome: Progressing   Problem: Education: Goal: Knowledge of the prescribed therapeutic regimen will improve Outcome: Progressing   Problem: Education: Goal: Knowledge of disease or condition will improve Outcome: Progressing

## 2021-09-22 NOTE — Progress Notes (Signed)
Patient's IV came out of her hand; refusing replacement IV.  States she is going home in the AM - does not want another IV.

## 2021-09-23 ENCOUNTER — Inpatient Hospital Stay (HOSPITAL_COMMUNITY): Payer: Self-pay

## 2021-09-23 ENCOUNTER — Other Ambulatory Visit (HOSPITAL_COMMUNITY): Payer: Self-pay

## 2021-09-23 LAB — CBC WITH DIFFERENTIAL/PLATELET
Abs Immature Granulocytes: 0.07 10*3/uL (ref 0.00–0.07)
Basophils Absolute: 0 10*3/uL (ref 0.0–0.1)
Basophils Relative: 0 %
Eosinophils Absolute: 0 10*3/uL (ref 0.0–0.5)
Eosinophils Relative: 0 %
HCT: 34.6 % — ABNORMAL LOW (ref 36.0–46.0)
Hemoglobin: 11.4 g/dL — ABNORMAL LOW (ref 12.0–15.0)
Immature Granulocytes: 1 %
Lymphocytes Relative: 21 %
Lymphs Abs: 1.9 10*3/uL (ref 0.7–4.0)
MCH: 31.1 pg (ref 26.0–34.0)
MCHC: 32.9 g/dL (ref 30.0–36.0)
MCV: 94.3 fL (ref 80.0–100.0)
Monocytes Absolute: 1 10*3/uL (ref 0.1–1.0)
Monocytes Relative: 11 %
Neutro Abs: 6.1 10*3/uL (ref 1.7–7.7)
Neutrophils Relative %: 67 %
Platelets: 274 10*3/uL (ref 150–400)
RBC: 3.67 MIL/uL — ABNORMAL LOW (ref 3.87–5.11)
RDW: 15.9 % — ABNORMAL HIGH (ref 11.5–15.5)
WBC: 9 10*3/uL (ref 4.0–10.5)
nRBC: 0 % (ref 0.0–0.2)

## 2021-09-23 LAB — COMPREHENSIVE METABOLIC PANEL
ALT: 10 U/L (ref 0–44)
AST: 13 U/L — ABNORMAL LOW (ref 15–41)
Albumin: 3.2 g/dL — ABNORMAL LOW (ref 3.5–5.0)
Alkaline Phosphatase: 56 U/L (ref 38–126)
Anion gap: 6 (ref 5–15)
BUN: 19 mg/dL (ref 6–20)
CO2: 28 mmol/L (ref 22–32)
Calcium: 9.1 mg/dL (ref 8.9–10.3)
Chloride: 108 mmol/L (ref 98–111)
Creatinine, Ser: 0.6 mg/dL (ref 0.44–1.00)
GFR, Estimated: >60 mL/min (ref >60)
Glucose, Bld: 124 mg/dL — ABNORMAL HIGH (ref 70–99)
Potassium: 4.3 mmol/L (ref 3.5–5.1)
Sodium: 142 mmol/L (ref 135–145)
Total Bilirubin: 0.3 mg/dL (ref 0.3–1.2)
Total Protein: 6 g/dL — ABNORMAL LOW (ref 6.5–8.1)

## 2021-09-23 LAB — PHOSPHORUS: Phosphorus: 2.5 mg/dL (ref 2.5–4.6)

## 2021-09-23 LAB — MAGNESIUM: Magnesium: 2 mg/dL (ref 1.7–2.4)

## 2021-09-23 MED ORDER — PREDNISONE 20 MG PO TABS: ORAL_TABLET | ORAL | 0 refills | Status: DC

## 2021-09-23 MED ORDER — DOXYCYCLINE HYCLATE 100 MG PO TABS
100.0000 mg | ORAL_TABLET | Freq: Two times a day (BID) | ORAL | 0 refills | Status: AC
Start: 2021-09-23 — End: 2021-09-27
  Filled 2021-09-23: qty 8, 4d supply, fill #0

## 2021-09-23 NOTE — Discharge Summary (Signed)
Physician Discharge Summary   Patient: Rachel Nolan MRN: [id number] DOB: 05-Sep-1970  Admit date:     09/20/2021  Discharge date: 09/23/21  Discharge Physician: Marguerita Merles, DO   PCP: Pcp, No   Recommendations at discharge:   Follow up with PCP within 1-2 weeks and repeat CBC, CMP, Mag, Phos Repeat CXR in 3-6 weeks  Discharge Diagnoses: Principal Problem:   COPD with acute exacerbation (HCC) Active Problems:   Cocaine abuse, continuous (HCC)   Essential hypertension   Tobacco abuse   Chronic respiratory failure with hypoxia (HCC) - 2 L/min   Polysubstance use disorder  Resolved Problems:   * No resolved hospital problems. [facility] Course: The patient is a 51 year old obese African-American female with a past medical history significant for but not limited to polysubstance abuse, chronic cocaine abuse, chronic opiate abuse, history of COPD, history of chronic respiratory failure on 2 L of supplemental oxygen via nasal cannula continuously, hypertension, depression and anxiety as well as other comorbidities presents to the ED with sudden shortness of breath yesterday.  She admitted to using cocaine on a regular basis and was just released from [facility] about 2 weeks ago for cocaine and tobacco induced COPD exacerbation.  She continues to abuse cocaine on a daily basis and upon arrival to the ED she was noted to have a slightly elevated heart rate.  She was given a continuous nebulizer treatment and labs were obtained and within normal limits.  Chest x-ray done and showed no acute cardiopulmonary disease.  UDS was positive for cocaine.  Given the patient continued to wheeze, the hospitalist was called contacted for admission and she is admitted for acute on chronic respiratory failure with hypoxia in the setting of acute exacerbation of COPD.  Went to go see the patient to evaluate her but she had signed out AMA prior to my arrival. Patient was concerned about her losing her belongings  at the shelter and Social Worker was trying to get an extension but Patient became upset and signed out being of sound mind. Risk of signing out AMA were discussed with the patient by the bedside nursing including but not limited to further worsening, decompensation, and even possible death and patient accepted these risks.   Assessment and Plan: COPD with acute exacerbation (HCC), Improving slowly  History of asthma -Admit to observation telemetry bed.  -Continue duonebs we will change to Xopenex and Atrovent scheduled every 6 hours.  -Po prednisone will be changed back to IV Solu-Medrol 60 mg q 12  yesterday given her continued wheezing and will start weaning  -Pt just released from [facility] 2 weeks ago for similar presentation of COPD exacerbation due to cocaine and tobacco abuse. -We will add Brovana and budesonide nebs as well -We will need incentive spirometry, flutter valve and guaifenesin 1200 mg p.o. twice daily -Smoking cessation counseling as below -Respiratory virus panel was negative and repeat chest x-ray today showed "Left base atelectasis or infiltrate. No other acute cardiopulmonary findings." -We will continue Doxycycline 100 mg p.o. twice daily for 5 days given Left Sided ATX vs Infiltrate but she singed out AMA; As a courtesy have sent her 4 more days of Abx and 3 Days of Steroids to Pharmacy on file -Patient signed out AMA due to concern for her losing her belongings at the Shelter   Cocaine abuse, continuous (HCC) -Pt abuses cocaine on a regular basis.  -She does not wish to stop.  -We will not prescribe IV Or  PO opiates and have further judicious use of analgesics.   -We will provide Prn clonidine for cocaine withdrawal and give every 4 hours as needed for systolic blood pressure greater than 170 or diastolic blood pressure greater than 100.   Polysubstance Use Disorder -Pt with known abuse of cocaine and marijuana.  -UDS POSITIVE for Cocaine  -Counseling  given   Acute on chronic respiratory failure with hypoxia (HCC) - 2 L/min -Continue with supplemental O2.  Normally wears 2 L but is up to 3 today -SpO2: 97 % O2 Flow Rate (L/min): 2 L/min; O2 requirement is improving  -We will need continuous pulse oximetry and maintain O2 saturations greater than 90% -Continue supplemental oxygen via nasal cannula and wean O2 as tolerated to her home oxygen requirements -Treatment as above   Leukocytosis -Patient's WBC went from 7.4 -> 11.4 -> 9.0 -In the setting of Steroid Demargination -Continue to Monitor and Trend -Repeat CBC within 1 week  Tobacco Abuse -Smoking cessation counseling given -Continue nicotine patch 20 mg transdermally every 24   Essential Hypertension -Avoid betablockers due to pt's continuous and chronic cocaine abuse. -Continue to monitor blood pressures per protocol -Last blood pressure reading was on the slower side and is now 148/103   Chest Discomfort, rule out ACS -Likely in the setting of her COPD exacerbation but also has cocaine use so we will need to rule out ACS -Troponin Flat and Negative and went from 8 -> 4 -> 5 -Continue telemetry monitoring -Repeat EKG for any recurrent CP -If necessary will provide some morphine and some sublingual nitroglycerin however suspect her chest discomfort is from her COPD exacerbation   Hyperglycemia -In the Setting of Steroid Demargination -Patient's Blood Sugar ranging from 84-236; Last check was 124 this AM  -Check HbA1c in the outpatient setting  -Continue to Monitor BS Carefully and may need to place on Sensitive Novolog AC   Obesity -Complicates overall prognosis and care -Estimated body mass index is 30.67 kg/m as calculated from the following:   Height as of this encounter: 5\' 6"  (1.676 m).   Weight as of this encounter: 86.2 kg.  -Weight Loss and Dietary Counseling given   Depression and Anxiety -Continue with home medications including quetiapine 100 mg p.o.  nightly, prazosin 1 mg p.o. nightly, and fluoxetine 40 mg p.o. every morning -We will also resume her hydroxyzine 50 mg p.o. 3 times daily as needed anxiety  Consultants: None Procedures performed: None  Disposition:  Signed out AMA  Diet recommendation:  Cardiac diet DISCHARGE MEDICATION: Allergies as of 09/23/2021   No Known Allergies      Medication List     STOP taking these medications    Senexon-S 8.6-50 MG tablet Generic drug: senna-docusate       TAKE these medications    albuterol 108 (90 Base) MCG/ACT inhaler Commonly known as: VENTOLIN HFA Inhale 2 puffs into the lungs every 4 (four) hours as needed for shortness of breath or wheezing. What changed: when to take this   albuterol (2.5 MG/3ML) 0.083% nebulizer solution Commonly known as: PROVENTIL Take 3 mLs (2.5 mg total) by nebulization every 4 (four) hours as needed for wheezing or shortness of breath. What changed: Another medication with the same name was changed. Make sure you understand how and when to take each.   amLODipine 5 MG tablet Commonly known as: NORVASC Take 1 tablet (5 mg total) by mouth daily. What changed: how much to take   doxycycline 100 MG tablet Commonly  known as: VIBRA-TABS Take 1 tablet (100 mg total) by mouth every 12 (twelve) hours for 4 days.   Dulera 200-5 MCG/ACT Aero Generic drug: mometasone-formoterol Inhale 2 puffs into the lungs 2 (two) times daily.   FLUoxetine 40 MG capsule Commonly known as: PROZAC Take 1 capsule (40 mg total) by mouth every morning.   gabapentin 400 MG capsule Commonly known as: NEURONTIN Take 800 mg by mouth 3 (three) times daily. What changed: Another medication with the same name was removed. Continue taking this medication, and follow the directions you see here.   hydrOXYzine 25 MG tablet Commonly known as: ATARAX Take 2 tablets (50 mg total) by mouth 3 (three) times daily as needed for anxiety.   ipratropium-albuterol 0.5-2.5 (3)  MG/3ML Soln Commonly known as: DUONEB USe 1 vial by nebulization every 6 (six) hours as needed (Use FIRST). What changed: reasons to take this   montelukast 10 MG tablet Commonly known as: SINGULAIR Take 1 tablet (10 mg total) by mouth at bedtime.   nicotine 21 mg/24hr patch Commonly known as: NICODERM CQ - dosed in mg/24 hours Place 1 patch (21 mg total) onto the skin daily.   prazosin 1 MG capsule Commonly known as: MINIPRESS Take 1 capsule (1 mg total) by mouth at bedtime.   predniSONE 20 MG tablet Commonly known as: DELTASONE 2 tabs po daily x 3 days   QUEtiapine 100 MG tablet Commonly known as: SEROQUEL Take 1 tablet (100 mg total) by mouth at bedtime.   traMADol 50 MG tablet Commonly known as: ULTRAM Take 50 mg by mouth every 6 (six) hours as needed (pain).   traZODone 150 MG tablet Commonly known as: DESYREL Take 1 tablet (150 mg total) by mouth at bedtime as needed for sleep. What changed: when to take this        Discharge Exam: Filed Weights   09/20/21 1635  Weight: 86.2 kg   Vitals:   09/23/21 0400 09/23/21 0828  BP: (!) 107/93 (!) 148/103  Pulse: 76 82  Resp: 17 18  Temp: 98.6 F (37 C) 98.1 F (36.7 C)  SpO2: 97%    NO PHYSICAL EXAM AS SHE LEFT PRIOR TO BEING SEEN  Condition at discharge:  Guarded  The results of significant diagnostics from this hospitalization (including imaging, microbiology, ancillary and laboratory) are listed below for reference.   Imaging Studies: DG CHEST PORT 1 VIEW  Result Date: 09/23/2021 CLINICAL DATA:  51 year old female with shortness of breath. EXAM: PORTABLE CHEST - 1 VIEW COMPARISON:  09/22/2021 FINDINGS: The mediastinal contours are within normal limits. Unchanged mild cardiomegaly. Similar appearing left basilar subsegmental atelectasis. The lungs are clear bilaterally without evidence of new focal consolidation, pleural effusion, or pneumothorax. No acute osseous abnormality. Similar appearing sigmoid  curvature of the thoracolumbar spine with partially visualized right-sided posterior fusion hardware in place, unchanged. IMPRESSION: 1. Similar appearing left basilar subsegmental atelectasis. 2. Unchanged mild cardiomegaly. Electronically Signed   By: Marliss Coots M.D.   On: 09/23/2021 08:04   DG CHEST PORT 1 VIEW  Result Date: 09/22/2021 CLINICAL DATA:  Shortness of breath. EXAM: PORTABLE CHEST 1 VIEW COMPARISON:  09/20/2021 FINDINGS: Atelectasis or infiltrate noted left base. Right lung clear. Cardiopericardial silhouette is at upper limits of normal for size. Similar fullness in both hilar regions presumably vascular anatomy. Thoracolumbar fusion rod evident. IMPRESSION: Left base atelectasis or infiltrate. No other acute cardiopulmonary findings. Electronically Signed   By: Kennith Center M.D.   On: 09/22/2021 06:04  DG Chest 2 View  Result Date: 09/20/2021 CLINICAL DATA:  Chest pain, shortness of breath EXAM: CHEST - 2 VIEW COMPARISON:  08/21/2021 FINDINGS: Cardiac size is within normal limits. There are no new infiltrates or signs of pulmonary edema. There is no pleural effusion or pneumothorax. [place] rod is seen in thoracolumbar spine. IMPRESSION: There are no new infiltrates or signs of pulmonary edema. Electronically Signed   By: Ernie Avena M.D.   On: 09/20/2021 17:10    Microbiology: Results for orders placed or performed during the hospital encounter of 09/20/21  Respiratory (~20 pathogens) panel by PCR     Status: None   Collection Time: 09/21/21 10:45 AM   Specimen: Nasopharyngeal Swab; Respiratory  Result Value Ref Range Status   Adenovirus NOT DETECTED NOT DETECTED Final   Coronavirus 229E NOT DETECTED NOT DETECTED Final    Comment: (NOTE) The Coronavirus on the Respiratory Panel, DOES NOT test for the novel  Coronavirus (2019 nCoV)    Coronavirus HKU1 NOT DETECTED NOT DETECTED Final   Coronavirus NL63 NOT DETECTED NOT DETECTED Final   Coronavirus OC43 NOT  DETECTED NOT DETECTED Final   Metapneumovirus NOT DETECTED NOT DETECTED Final   Rhinovirus / Enterovirus NOT DETECTED NOT DETECTED Final   Influenza A NOT DETECTED NOT DETECTED Final   Influenza B NOT DETECTED NOT DETECTED Final   Parainfluenza Virus 1 NOT DETECTED NOT DETECTED Final   Parainfluenza Virus 2 NOT DETECTED NOT DETECTED Final   Parainfluenza Virus 3 NOT DETECTED NOT DETECTED Final   Parainfluenza Virus 4 NOT DETECTED NOT DETECTED Final   Respiratory Syncytial Virus NOT DETECTED NOT DETECTED Final   Bordetella pertussis NOT DETECTED NOT DETECTED Final   Bordetella Parapertussis NOT DETECTED NOT DETECTED Final   Chlamydophila pneumoniae NOT DETECTED NOT DETECTED Final   Mycoplasma pneumoniae NOT DETECTED NOT DETECTED Final    Comment: Performed at [facility], [address] [place], [place] [address]   Labs: CBC: Recent Labs  Lab 09/20/21 1643 09/21/21 1022 09/23/21 0401  WBC 7.4 11.4* 9.0  NEUTROABS  --  9.6* 6.1  HGB 12.4 13.0 11.4*  HCT 36.4 38.7 34.6*  MCV 91.5 94.4 94.3  PLT 292 283 274   Basic Metabolic Panel: Recent Labs  Lab 09/20/21 1643 09/21/21 1022 09/23/21 0401  NA 138 138 142  K 3.9 4.4 4.3  CL 104 103 108  CO2 24 25 28   GLUCOSE 84 236* 124*  BUN 15 15 19   CREATININE 0.70 0.72 0.60  CALCIUM 9.6 9.3 9.1  MG  --  2.5* 2.0  PHOS  --  3.1 2.5   Liver Function Tests: Recent Labs  Lab 09/21/21 1022 09/23/21 0401  AST 20 13*  ALT 14 10  ALKPHOS 71 56  BILITOT 0.2* 0.3  PROT 7.3 6.0*  ALBUMIN 3.9 3.2*   CBG: No results for input(s): "GLUCAP" in the last 168 hours.  Discharge time spent: greater than 30 minutes.  Signed: 11/21/21, DO [facility] 09/23/2021

## 2021-09-23 NOTE — Progress Notes (Signed)
Patient is alert and oriented. When this nurse was informing the patient that the Social Worker is trying to get an extension at the shelter for her, (in order for the patient not to lose her belongings there) the patient stated, "How can she get in touch with them and I can't. Can't nobody hod me here against my will. I'm gonna have to sign myself out." Patient signed AMA form, placed in chart.

## 2021-09-23 NOTE — Evaluation (Signed)
Occupational Therapy Evaluation and Discharge Patient Details Name: Rachel Nolan MRN: [id number] DOB: February 07, 1970 Today's Date: 09/23/2021   History of Present Illness 51 year old woman with hx of polysubstance abuse, COPD, chronic respiratory failure on 2L home 02 admitted with sudden onset of SOB on 09/20/21 from homeless shelter.   Clinical Impression   Pt is functioning modified independently in mobility and ADLs. No OT needs.      Recommendations for follow up therapy are one component of a multi-disciplinary discharge planning process, led by the attending physician.  Recommendations may be updated based on patient status, additional functional criteria and insurance authorization.   Follow Up Recommendations  No OT follow up    Assistance Recommended at Discharge PRN  Patient can return home with the following Assist for transportation    Functional Status Assessment  Patient has had a recent decline in their functional status and demonstrates the ability to make significant improvements in function in a reasonable and predictable amount of time.  Equipment Recommendations  None recommended by OT    Recommendations for Other Services       Precautions / Restrictions Precautions Precautions: Fall      Mobility Bed Mobility               General bed mobility comments: pt up walking upon arrival    Transfers Overall transfer level: Modified independent Equipment used: None, Rollator (4 wheels)               General transfer comment: furniture walks or uses rollator      Balance Overall balance assessment: Modified Independent                                         ADL either performed or assessed with clinical judgement   ADL Overall ADL's : Modified independent                                       General ADL Comments: pt gathering items around her room for ADLs without 02, extended tubing and placed on pt      Vision Ability to See in Adequate Light: 0 Adequate Patient Visual Report: No change from baseline       Perception     Praxis      Pertinent Vitals/Pain Pain Assessment Pain Assessment: No/denies pain     Hand Dominance Right   Extremity/Trunk Assessment Upper Extremity Assessment Upper Extremity Assessment: Overall WFL for tasks assessed   Lower Extremity Assessment Lower Extremity Assessment: Defer to PT evaluation       Communication Communication Communication: No difficulties   Cognition Arousal/Alertness: Awake/alert Behavior During Therapy: Impulsive Overall Cognitive Status: Within Functional Limits for tasks assessed                                       General Comments       Exercises     Shoulder Instructions      Home Living Family/patient expects to be discharged to:: Shelter/Homeless                                 Additional  Comments: plans to go pick her belongings up from the shelter and go home with her family in [facility]      Prior Functioning/Environment Prior Level of Function : Independent/Modified Independent             Mobility Comments: walks with a rollator ADLs Comments: independent at shelter        OT Problem List:        OT Treatment/Interventions:      OT Goals(Current goals can be found in the care plan section)    OT Frequency:      Co-evaluation              AM-PAC OT "6 Clicks" Daily Activity     Outcome Measure Help from another person eating meals?: None Help from another person taking care of personal grooming?: None Help from another person toileting, which includes using toliet, bedpan, or urinal?: None Help from another person bathing (including washing, rinsing, drying)?: None Help from another person to put on and taking off regular upper body clothing?: None Help from another person to put on and taking off regular lower body clothing?: None 6 Click Score:  24   End of Session    Activity Tolerance: Patient tolerated treatment well Patient left: with call bell/phone within reach;in chair  OT Visit Diagnosis: Other abnormalities of gait and mobility (R26.89)                Time: 9390-3009 OT Time Calculation (min): 11 min Charges:  OT General Charges $OT Visit: 1 Visit OT Evaluation $OT Eval Low Complexity: 1 Low  Berna Spare, OTR/L Acute Rehabilitation Services Office: [phone]  Rachel Nolan 09/23/2021, 9:39 AM

## 2021-09-23 NOTE — TOC Progression Note (Signed)
Transition of Care [facility]) - Progression Note    Patient Details  Name: Rachel Nolan MRN: [id number] Date of Birth: 1970/06/01  Transition of Care [facility]) CM/SW Contact  Mearl Latin, LCSW Phone Number: 09/23/2021, 10:25 AM  Clinical Narrative:    CSW contacted the [facility] and asked them to hold patient things for her while she is in the hospital. Receptionist stated all she could do is take my name and number and send the request as normally people have 7 days to pick up their items. She stated patient would have to be able to care for herself and be on a top bunk in order to return. Info provide to RN.   Update: Per RN, patient leaving AMA. CSW notified Jonny Ruiz the patient's ACT team case manager with [employer] [phone]).     Expected Discharge Plan: Homeless Shelter Barriers to Discharge: Inadequate or no insurance, Architect, Homeless with medical needs  Expected Discharge Plan and Services Expected Discharge Plan: Homeless Shelter In-house Referral: Clinical Social Work     Living arrangements for the past 2 months: Homeless                                       Social Determinants of Health (SDOH) Interventions    Readmission Risk Interventions    09/22/2021    5:06 PM 05/19/2021   10:10 AM 05/13/2021   11:33 AM  Readmission Risk Prevention Plan  Transportation Screening Complete Complete Complete  Medication Review (RN Care Manager) Complete Complete Complete  PCP or Specialist appointment within 3-5 days of discharge Complete  Not Complete  PCP/Specialist Appt Not Complete comments  first apt is 5/24 apt wll be made for [facility] new pcp apt  HRI or Home Care Consult Complete Complete Complete  SW Recovery Care/Counseling Consult Complete Complete Complete  Palliative Care Screening Not Applicable Not Applicable Not Applicable  Skilled Nursing Facility Not Applicable Not Applicable Not Applicable

## 2021-11-23 IMAGING — CT CT ANGIO HEAD
1 of 10 series · 6 of 33 positions shown · IV contrast (APPLIED)
Comparison: CT headache 15 7870

CLINICAL DATA: Decreased vision right eye.  Rule out stroke.

EXAM:
CT ANGIOGRAPHY HEAD AND NECK
TECHNIQUE: Multidetector CT imaging of the head and neck was performed using
the standard protocol during bolus administration of intravenous
contrast. Multiplanar CT image reconstructions and MIPs were
obtained to evaluate the vascular anatomy. Carotid stenosis
measurements (when applicable) are obtained utilizing NASCET
criteria, using the distal internal carotid diameter as the
denominator.
CONTRAST:  75mL OMNIPAQUE IOHEXOL 350 MG/ML SOLN

[Series 10: ax thin · axial · 0.39mm/px · z∈[-333,-82]mm · 6 of 360 slices shown]
[im 52/360  soft-tissue]
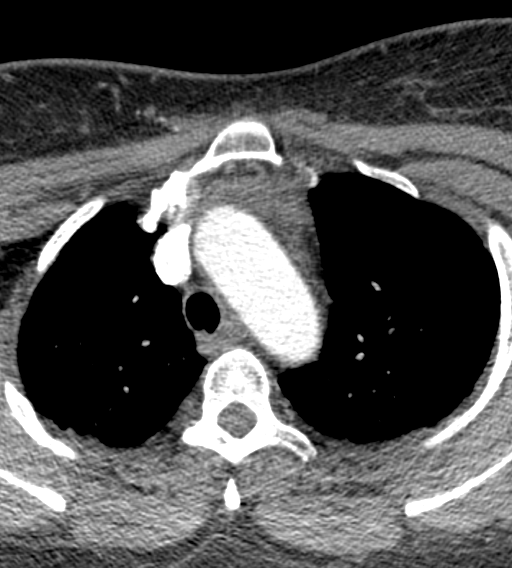
[im 103/360  bone]
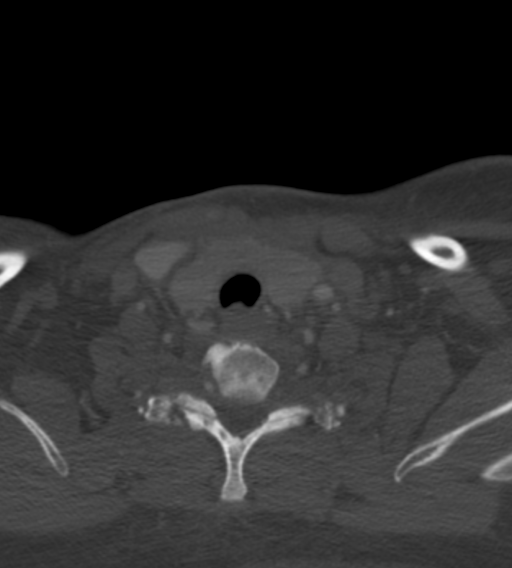
[im 154/360  soft-tissue]
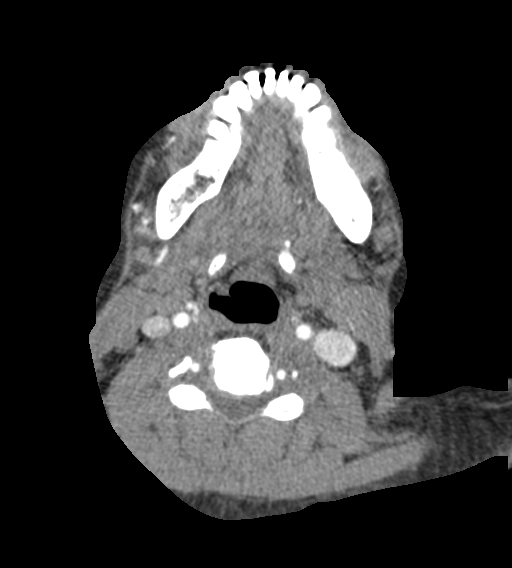
[im 206/360  bone]
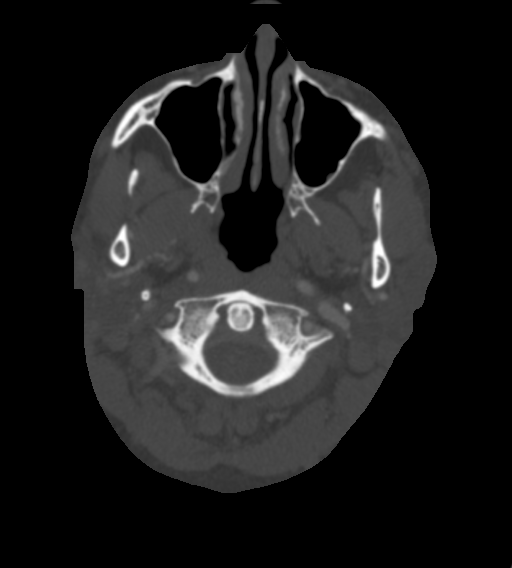
[im 257/360  soft-tissue]
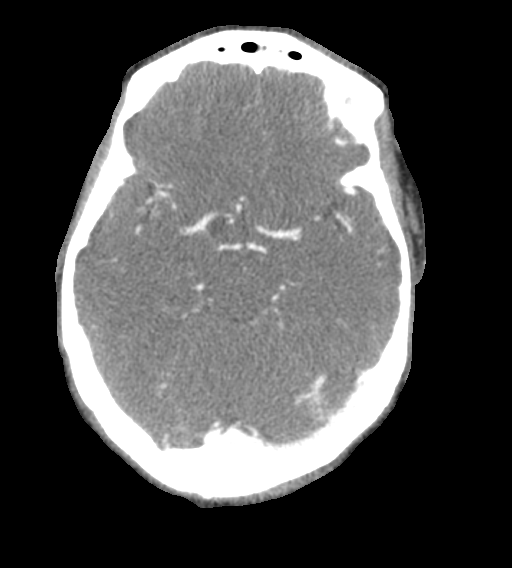
[im 308/360  bone]
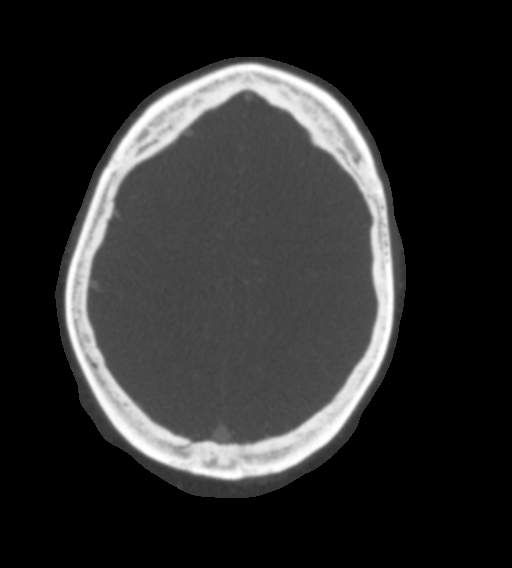

[6 of 33 positions shown; findings below may reference images not displayed]

FINDINGS: CT HEAD FINDINGS

Brain: No evidence of acute infarction, hemorrhage, hydrocephalus,
extra-axial collection or mass lesion/mass effect.

Vascular: Negative for hyperdense vessel

Skull: Negative

Sinuses: Paranasal sinuses clear.

Orbits: Negative

Review of the MIP images confirms the above findings

CTA NECK FINDINGS

Aortic arch: Normal aortic arch. Bovine branching arch. Proximal
great vessels normal.

Right carotid system: Normal right carotid. Negative for
atherosclerotic disease or stenosis

Left carotid system: Normal left carotid. Negative for
atherosclerotic disease or stenosis.

Vertebral arteries: Both vertebral arteries are normal and widely
patent to the basilar.

Skeleton: Cervical spondylosis.  No acute skeletal abnormality.

Other neck: Mild thyroid enlargement diffusely. No focal nodule. No
mass or adenopathy in the neck.

Upper chest: Extensive apical emphysema.  No acute abnormality.

Review of the MIP images confirms the above findings

CTA HEAD FINDINGS

Anterior circulation: Cavernous carotid widely patent bilaterally.
Anterior and middle cerebral arteries widely patent and normal
bilaterally.

Posterior circulation: Both vertebral arteries widely patent to the
basilar. Posterior circulation widely patent without stenosis or
large vessel occlusion.

Venous sinuses: Normal venous enhancement. Hypoplastic right
transverse and sigmoid sinus.

Anatomic variants: None

Review of the MIP images confirms the above findings
IMPRESSION: 1. Negative CT head.
2. Negative CT angio head and neck. No intracranial extracranial
stenosis.
3. These results were called by telephone at the time of
interpretation on 04/07/2020 at [DATE] to provider LORENZ JUMPER ,
who verbally acknowledged these results.

## 2021-12-12 ENCOUNTER — Inpatient Hospital Stay (HOSPITAL_COMMUNITY)
Admission: EM | Admit: 2021-12-12 | Discharge: 2021-12-15 | DRG: 871 | Disposition: A | Discharge status: Home / Self Care | Payer: Commercial Managed Care - HMO | Attending: Internal Medicine | Admitting: Internal Medicine

## 2021-12-12 ENCOUNTER — Emergency Department (HOSPITAL_COMMUNITY): Payer: Commercial Managed Care - HMO

## 2021-12-12 ENCOUNTER — Encounter (HOSPITAL_COMMUNITY): Payer: Self-pay | Admitting: Internal Medicine

## 2021-12-12 ENCOUNTER — Other Ambulatory Visit: Payer: Self-pay

## 2021-12-12 DIAGNOSIS — F1210 Cannabis abuse, uncomplicated: Secondary | ICD-10-CM | POA: Diagnosis present

## 2021-12-12 DIAGNOSIS — F32A Depression, unspecified: Secondary | ICD-10-CM | POA: Diagnosis present

## 2021-12-12 DIAGNOSIS — Z1152 Encounter for screening for COVID-19: Secondary | ICD-10-CM

## 2021-12-12 DIAGNOSIS — Z5901 Sheltered homelessness: Secondary | ICD-10-CM

## 2021-12-12 DIAGNOSIS — J440 Chronic obstructive pulmonary disease with acute lower respiratory infection: Secondary | ICD-10-CM | POA: Diagnosis present

## 2021-12-12 DIAGNOSIS — R9389 Abnormal findings on diagnostic imaging of other specified body structures: Secondary | ICD-10-CM | POA: Diagnosis present

## 2021-12-12 DIAGNOSIS — R0603 Acute respiratory distress: Secondary | ICD-10-CM | POA: Diagnosis not present

## 2021-12-12 DIAGNOSIS — J45901 Unspecified asthma with (acute) exacerbation: Secondary | ICD-10-CM | POA: Diagnosis present

## 2021-12-12 DIAGNOSIS — Z6833 Body mass index (BMI) 33.0-33.9, adult: Secondary | ICD-10-CM | POA: Diagnosis not present

## 2021-12-12 DIAGNOSIS — E1165 Type 2 diabetes mellitus with hyperglycemia: Secondary | ICD-10-CM | POA: Diagnosis present

## 2021-12-12 DIAGNOSIS — M419 Scoliosis, unspecified: Secondary | ICD-10-CM | POA: Diagnosis present

## 2021-12-12 DIAGNOSIS — N939 Abnormal uterine and vaginal bleeding, unspecified: Secondary | ICD-10-CM | POA: Diagnosis present

## 2021-12-12 DIAGNOSIS — Z9981 Dependence on supplemental oxygen: Secondary | ICD-10-CM | POA: Diagnosis not present

## 2021-12-12 DIAGNOSIS — F419 Anxiety disorder, unspecified: Secondary | ICD-10-CM | POA: Diagnosis present

## 2021-12-12 DIAGNOSIS — E876 Hypokalemia: Secondary | ICD-10-CM | POA: Diagnosis present

## 2021-12-12 DIAGNOSIS — F17210 Nicotine dependence, cigarettes, uncomplicated: Secondary | ICD-10-CM | POA: Diagnosis present

## 2021-12-12 DIAGNOSIS — I10 Essential (primary) hypertension: Secondary | ICD-10-CM | POA: Diagnosis present

## 2021-12-12 DIAGNOSIS — R627 Adult failure to thrive: Secondary | ICD-10-CM | POA: Diagnosis present

## 2021-12-12 DIAGNOSIS — Z8249 Family history of ischemic heart disease and other diseases of the circulatory system: Secondary | ICD-10-CM

## 2021-12-12 DIAGNOSIS — J189 Pneumonia, unspecified organism: Secondary | ICD-10-CM | POA: Insufficient documentation

## 2021-12-12 DIAGNOSIS — F1410 Cocaine abuse, uncomplicated: Secondary | ICD-10-CM | POA: Diagnosis present

## 2021-12-12 DIAGNOSIS — J181 Lobar pneumonia, unspecified organism: Secondary | ICD-10-CM | POA: Diagnosis present

## 2021-12-12 DIAGNOSIS — E669 Obesity, unspecified: Secondary | ICD-10-CM | POA: Diagnosis present

## 2021-12-12 DIAGNOSIS — Z7951 Long term (current) use of inhaled steroids: Secondary | ICD-10-CM

## 2021-12-12 DIAGNOSIS — R739 Hyperglycemia, unspecified: Secondary | ICD-10-CM | POA: Insufficient documentation

## 2021-12-12 DIAGNOSIS — J441 Chronic obstructive pulmonary disease with (acute) exacerbation: Secondary | ICD-10-CM | POA: Diagnosis not present

## 2021-12-12 DIAGNOSIS — Z720 Tobacco use: Secondary | ICD-10-CM | POA: Diagnosis present

## 2021-12-12 DIAGNOSIS — J9621 Acute and chronic respiratory failure with hypoxia: Secondary | ICD-10-CM | POA: Insufficient documentation

## 2021-12-12 DIAGNOSIS — A419 Sepsis, unspecified organism: Secondary | ICD-10-CM | POA: Diagnosis not present

## 2021-12-12 DIAGNOSIS — F1911 Other psychoactive substance abuse, in remission: Secondary | ICD-10-CM | POA: Diagnosis present

## 2021-12-12 DIAGNOSIS — T380X5A Adverse effect of glucocorticoids and synthetic analogues, initial encounter: Secondary | ICD-10-CM | POA: Diagnosis present

## 2021-12-12 DIAGNOSIS — Z79899 Other long term (current) drug therapy: Secondary | ICD-10-CM

## 2021-12-12 LAB — CBC
HCT: 36.3 % (ref 36.0–46.0)
Hemoglobin: 11.9 g/dL — ABNORMAL LOW (ref 12.0–15.0)
MCH: 30.8 pg (ref 26.0–34.0)
MCHC: 32.8 g/dL (ref 30.0–36.0)
MCV: 94 fL (ref 80.0–100.0)
Platelets: 207 10*3/uL (ref 150–400)
RBC: 3.86 MIL/uL — ABNORMAL LOW (ref 3.87–5.11)
RDW: 14.6 % (ref 11.5–15.5)
WBC: 11.9 10*3/uL — ABNORMAL HIGH (ref 4.0–10.5)
nRBC: 0 % (ref 0.0–0.2)

## 2021-12-12 LAB — I-STAT CHEM 8, ED
BUN: 13 mg/dL (ref 6–20)
Calcium, Ion: 1.15 mmol/L (ref 1.15–1.40)
Chloride: 100 mmol/L (ref 98–111)
Creatinine, Ser: 0.6 mg/dL (ref 0.44–1.00)
Glucose, Bld: 163 mg/dL — ABNORMAL HIGH (ref 70–99)
HCT: 40 % (ref 36.0–46.0)
Hemoglobin: 13.6 g/dL (ref 12.0–15.0)
Potassium: 3.4 mmol/L — ABNORMAL LOW (ref 3.5–5.1)
Sodium: 138 mmol/L (ref 135–145)
TCO2: 27 mmol/L (ref 22–32)

## 2021-12-12 LAB — I-STAT BETA HCG BLOOD, ED (MC, WL, AP ONLY): I-stat hCG, quantitative: <5 m[IU]/mL (ref <5)

## 2021-12-12 LAB — I-STAT VENOUS BLOOD GAS, ED
Acid-Base Excess: 8 mmol/L — ABNORMAL HIGH (ref 0.0–2.0)
Bicarbonate: 29 mmol/L — ABNORMAL HIGH (ref 20.0–28.0)
Calcium, Ion: 0.75 mmol/L — CL (ref 1.15–1.40)
HCT: 42 % (ref 36.0–46.0)
Hemoglobin: 14.3 g/dL (ref 12.0–15.0)
O2 Saturation: 100 %
Potassium: >8.5 mmol/L (ref 3.5–5.1)
Sodium: 126 mmol/L — ABNORMAL LOW (ref 135–145)
TCO2: 30 mmol/L (ref 22–32)
pCO2, Ven: 28 mmHg — ABNORMAL LOW (ref 44–60)
pH, Ven: 7.623 (ref 7.25–7.43)
pO2, Ven: 153 mmHg — ABNORMAL HIGH (ref 32–45)

## 2021-12-12 LAB — RESPIRATORY PANEL BY PCR

## 2021-12-12 LAB — CBC WITH DIFFERENTIAL/PLATELET
Abs Immature Granulocytes: 0.05 10*3/uL (ref 0.00–0.07)
Basophils Absolute: 0 10*3/uL (ref 0.0–0.1)
Basophils Relative: 0 %
Eosinophils Absolute: 0.1 10*3/uL (ref 0.0–0.5)
Eosinophils Relative: 0 %
HCT: 39.9 % (ref 36.0–46.0)
Hemoglobin: 13.6 g/dL (ref 12.0–15.0)
Immature Granulocytes: 0 %
Lymphocytes Relative: 21 %
Lymphs Abs: 3.2 10*3/uL (ref 0.7–4.0)
MCH: 31.8 pg (ref 26.0–34.0)
MCHC: 34.1 g/dL (ref 30.0–36.0)
MCV: 93.2 fL (ref 80.0–100.0)
Monocytes Absolute: 1.7 10*3/uL — ABNORMAL HIGH (ref 0.1–1.0)
Monocytes Relative: 11 %
Neutro Abs: 10.3 10*3/uL — ABNORMAL HIGH (ref 1.7–7.7)
Neutrophils Relative %: 68 %
Platelets: 225 10*3/uL (ref 150–400)
RBC: 4.28 MIL/uL (ref 3.87–5.11)
RDW: 15.6 % — ABNORMAL HIGH (ref 11.5–15.5)
WBC: 15.3 10*3/uL — ABNORMAL HIGH (ref 4.0–10.5)
nRBC: 0 % (ref 0.0–0.2)

## 2021-12-12 LAB — COMPREHENSIVE METABOLIC PANEL
ALT: 13 U/L (ref 0–44)
AST: 14 U/L — ABNORMAL LOW (ref 15–41)
Albumin: 3.3 g/dL — ABNORMAL LOW (ref 3.5–5.0)
Alkaline Phosphatase: 58 U/L (ref 38–126)
Anion gap: 12 (ref 5–15)
BUN: 13 mg/dL (ref 6–20)
CO2: 27 mmol/L (ref 22–32)
Calcium: 9.3 mg/dL (ref 8.9–10.3)
Chloride: 99 mmol/L (ref 98–111)
Creatinine, Ser: 0.8 mg/dL (ref 0.44–1.00)
GFR, Estimated: >60 mL/min (ref >60)
Glucose, Bld: 179 mg/dL — ABNORMAL HIGH (ref 70–99)
Potassium: 3.4 mmol/L — ABNORMAL LOW (ref 3.5–5.1)
Sodium: 138 mmol/L (ref 135–145)
Total Bilirubin: 0.4 mg/dL (ref 0.3–1.2)
Total Protein: 6.7 g/dL (ref 6.5–8.1)

## 2021-12-12 LAB — CBG MONITORING, ED
Glucose-Capillary: 278 mg/dL — ABNORMAL HIGH (ref 70–99)
Glucose-Capillary: 116 mg/dL — ABNORMAL HIGH (ref 70–99)

## 2021-12-12 LAB — RESP PANEL BY RT-PCR (FLU A&B, COVID) ARPGX2
Influenza A by PCR: NEGATIVE
Influenza B by PCR: NEGATIVE
SARS Coronavirus 2 by RT PCR: NEGATIVE

## 2021-12-12 LAB — MAGNESIUM: Magnesium: 2.2 mg/dL (ref 1.7–2.4)

## 2021-12-12 LAB — LACTIC ACID, PLASMA: Lactic Acid, Venous: 1.2 mmol/L (ref 0.5–1.9)

## 2021-12-12 LAB — GLUCOSE, CAPILLARY: Glucose-Capillary: 123 mg/dL — ABNORMAL HIGH (ref 70–99)

## 2021-12-12 LAB — TROPONIN I (HIGH SENSITIVITY): Troponin I (High Sensitivity): 7 ng/L (ref <18)

## 2021-12-12 MED ORDER — ENOXAPARIN SODIUM 40 MG/0.4ML IJ SOSY
40.0000 mg | PREFILLED_SYRINGE | INTRAMUSCULAR | Status: DC
  Administered 2021-12-12 – 2021-12-15 (×4): 40 mg via SUBCUTANEOUS
  Filled 2021-12-12 (×4): qty 0.4

## 2021-12-12 MED ORDER — GABAPENTIN 400 MG PO CAPS
800.0000 mg | ORAL_CAPSULE | Freq: Three times a day (TID) | ORAL | Status: DC
  Administered 2021-12-12 – 2021-12-15 (×10): 800 mg via ORAL
  Filled 2021-12-12 (×10): qty 2

## 2021-12-12 MED ORDER — HYDROXYZINE HCL 25 MG PO TABS: 50.0000 mg | ORAL_TABLET | Freq: Three times a day (TID) | ORAL | Status: DC | PRN

## 2021-12-12 MED ORDER — SODIUM CHLORIDE 0.9 % IV BOLUS (SEPSIS)
1000.0000 mL | Freq: Once | INTRAVENOUS | Status: AC
  Administered 2021-12-12: 1000 mL via INTRAVENOUS

## 2021-12-12 MED ORDER — IPRATROPIUM BROMIDE 0.02 % IN SOLN
0.5000 mg | Freq: Once | RESPIRATORY_TRACT | Status: AC
  Administered 2021-12-12: 0.5 mg via RESPIRATORY_TRACT
  Filled 2021-12-12: qty 2.5

## 2021-12-12 MED ORDER — ACETAMINOPHEN 325 MG PO TABS: 650.0000 mg | ORAL_TABLET | Freq: Four times a day (QID) | ORAL | Status: DC | PRN

## 2021-12-12 MED ORDER — BUDESONIDE 0.25 MG/2ML IN SUSP
0.2500 mg | Freq: Two times a day (BID) | RESPIRATORY_TRACT | Status: DC
  Administered 2021-12-12 – 2021-12-15 (×6): 0.25 mg via RESPIRATORY_TRACT
  Filled 2021-12-12 (×7): qty 2

## 2021-12-12 MED ORDER — FLUOXETINE HCL 20 MG PO CAPS
40.0000 mg | ORAL_CAPSULE | Freq: Every morning | ORAL | Status: DC
  Administered 2021-12-13 – 2021-12-15 (×3): 40 mg via ORAL
  Filled 2021-12-12 (×3): qty 2

## 2021-12-12 MED ORDER — NICOTINE 14 MG/24HR TD PT24
14.0000 mg | MEDICATED_PATCH | Freq: Every day | TRANSDERMAL | Status: DC
  Administered 2021-12-12 – 2021-12-15 (×3): 14 mg via TRANSDERMAL
  Filled 2021-12-12 (×4): qty 1

## 2021-12-12 MED ORDER — MONTELUKAST SODIUM 10 MG PO TABS
10.0000 mg | ORAL_TABLET | Freq: Every day | ORAL | Status: DC
  Administered 2021-12-12 – 2021-12-14 (×3): 10 mg via ORAL
  Filled 2021-12-12 (×3): qty 1

## 2021-12-12 MED ORDER — POTASSIUM CHLORIDE CRYS ER 20 MEQ PO TBCR
40.0000 meq | EXTENDED_RELEASE_TABLET | Freq: Once | ORAL | Status: AC
  Administered 2021-12-12: 40 meq via ORAL
  Filled 2021-12-12: qty 2

## 2021-12-12 MED ORDER — IPRATROPIUM BROMIDE 0.02 % IN SOLN
0.5000 mg | Freq: Four times a day (QID) | RESPIRATORY_TRACT | Status: DC
  Administered 2021-12-12 – 2021-12-15 (×10): 0.5 mg via RESPIRATORY_TRACT
  Filled 2021-12-12 (×11): qty 2.5

## 2021-12-12 MED ORDER — MAGNESIUM SULFATE 2 GM/50ML IV SOLN
2.0000 g | Freq: Once | INTRAVENOUS | Status: AC
  Administered 2021-12-12: 2 g via INTRAVENOUS
  Filled 2021-12-12: qty 50

## 2021-12-12 MED ORDER — METHYLPREDNISOLONE SODIUM SUCC 125 MG IJ SOLR
60.0000 mg | Freq: Two times a day (BID) | INTRAMUSCULAR | Status: DC
  Administered 2021-12-12 – 2021-12-13 (×3): 60 mg via INTRAVENOUS
  Filled 2021-12-12 (×3): qty 2

## 2021-12-12 MED ORDER — SODIUM CHLORIDE 0.9 % IV SOLN
2.0000 g | INTRAVENOUS | Status: DC
  Administered 2021-12-12 – 2021-12-14 (×3): 2 g via INTRAVENOUS
  Filled 2021-12-12 (×3): qty 20

## 2021-12-12 MED ORDER — SODIUM CHLORIDE 0.9 % IV SOLN
500.0000 mg | INTRAVENOUS | Status: DC
  Administered 2021-12-12 – 2021-12-14 (×3): 500 mg via INTRAVENOUS
  Filled 2021-12-12 (×5): qty 5

## 2021-12-12 MED ORDER — LACTATED RINGERS IV SOLN: INTRAVENOUS | Status: DC

## 2021-12-12 MED ORDER — GUAIFENESIN ER 600 MG PO TB12
600.0000 mg | ORAL_TABLET | Freq: Two times a day (BID) | ORAL | Status: DC
  Administered 2021-12-12 – 2021-12-15 (×7): 600 mg via ORAL
  Filled 2021-12-12 (×7): qty 1

## 2021-12-12 MED ORDER — QUETIAPINE FUMARATE 50 MG PO TABS
100.0000 mg | ORAL_TABLET | Freq: Every day | ORAL | Status: DC
  Administered 2021-12-12 – 2021-12-14 (×3): 100 mg via ORAL
  Filled 2021-12-12 (×3): qty 2

## 2021-12-12 MED ORDER — ALBUTEROL SULFATE (2.5 MG/3ML) 0.083% IN NEBU
10.0000 mg | INHALATION_SOLUTION | RESPIRATORY_TRACT | Status: DC
  Administered 2021-12-12: 10 mg via RESPIRATORY_TRACT
  Filled 2021-12-12: qty 12

## 2021-12-12 MED ORDER — ACETAMINOPHEN 650 MG RE SUPP: 650.0000 mg | Freq: Four times a day (QID) | RECTAL | Status: DC | PRN

## 2021-12-12 MED ORDER — PRAZOSIN HCL 1 MG PO CAPS
1.0000 mg | ORAL_CAPSULE | Freq: Every day | ORAL | Status: DC
  Administered 2021-12-12 – 2021-12-13 (×2): 1 mg via ORAL
  Filled 2021-12-12 (×3): qty 1

## 2021-12-12 MED ORDER — LEVALBUTEROL HCL 0.63 MG/3ML IN NEBU
0.6300 mg | INHALATION_SOLUTION | Freq: Four times a day (QID) | RESPIRATORY_TRACT | Status: DC
  Administered 2021-12-12 – 2021-12-15 (×10): 0.63 mg via RESPIRATORY_TRACT
  Filled 2021-12-12 (×13): qty 3

## 2021-12-12 NOTE — ED Notes (Signed)
Pt ambulated independently to the restroom.

## 2021-12-12 NOTE — ED Notes (Signed)
Pt ambulated to bathroom with this RN. Spo2 maintained 92--98% on 5L [place] (baseline)

## 2021-12-12 NOTE — ED Provider Notes (Signed)
[facility] EMERGENCY DEPARTMENT Provider Note  CSN: [id number] Arrival date & time: 12/12/21 0147  Chief Complaint(s) Respiratory Distress (BIB EMS for resp distress (COPD) that started around 1700 on 12/11/21. Per pt she usually takes 2 albuterol tx a day but had 3 today without relief, 2 duo nebs given with EMS and 135 solumedrol PTA, pt A&Ox4, pt reports cocaine use yesterday)  HPI Rachel Nolan is a 51 y.o. female with a past medical history listed below including asthma and COPD on 4 L nasal cannula at home.  She presents to the emergency department with 2 days of gradually worsening shortness of breath not relieved by her home breathing treatments.  Patient endorsing some mild cou and stating that she is coughing up a little cold.  She denies any known sick contacts.  No fevers or chills.  No chest pain.  No lower extremity swelling.  No other physical complaints.  Due to the severity of the shortness of breath, EMS was called and gave the patient Solu-Medrol and DuoNebs x2 which is currently going.  Patient does not feel any improvement yet.  The history is provided by the patient.    Past Medical History Past Medical History:  Diagnosis Date   Anxiety    Asthma    COPD (chronic obstructive pulmonary disease) (HCC)    Depression    Hypertension    Scoliosis    Patient Active Problem List   Diagnosis Date Noted   Acute on chronic respiratory failure with hypoxemia (HCC) 12/12/2021   Sepsis (HCC) 12/12/2021   CAP (community acquired pneumonia) 12/12/2021   Hypokalemia 12/12/2021   Hyperglycemia 12/12/2021   COPD with acute exacerbation (HCC) 09/20/2021   Polysubstance use disorder    Asthma exacerbation 05/17/2021   OSA (obstructive sleep apnea) 05/17/2021   Marijuana user 05/17/2021   Chronic respiratory failure with hypoxia (HCC) - 2 L/min 04/20/2021   History of substance abuse (HCC) 04/20/2021   Nicotine dependence, cigarettes, uncomplicated  02/12/2021   Prolonged Q-T interval on ECG 09/26/2020   Mild asthma exacerbation 09/24/2020   Tobacco abuse 09/24/2020   Abnormal weight loss 09/24/2020   Asthma 09/23/2020   Homeless 08/22/2020   Cocaine abuse, continuous (HCC) 07/09/2020   Opioid use disorder, moderate, dependence (HCC) 05/16/2020   Adjustment disorder with depressed mood 04/24/2020   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   Essential hypertension 03/26/2020   Neuropathic pain 03/26/2020   Severe recurrent major depression with psychotic features (HCC) 03/25/2020   Major depressive disorder with psychotic features (HCC) 01/24/2020   Stimulant use disorder 01/24/2020   Alcohol use disorder, severe, dependence (HCC) 12/06/2019   Cocaine dependence (HCC) 03/05/2019   Major depressive disorder, recurrent episode, severe (HCC) 03/02/2019   Post traumatic stress disorder (PTSD) 03/02/2019   Anxiety and depression 03/02/2019   Substance induced mood disorder (HCC) 03/02/2019   MDD (major depressive disorder) 03/01/2019   Home Medication(s) Prior to Admission medications   Medication Sig Start Date End Date Taking? Authorizing Provider  albuterol (PROVENTIL) (2.5 MG/3ML) 0.083% nebulizer solution Take 3 mLs (2.5 mg total) by nebulization every 4 (four) hours as needed for wheezing or shortness of breath. Patient not taking: Reported on 12/12/2021 05/13/21 05/13/22  Pokhrel, Rebekah Chesterfield, MD  albuterol (VENTOLIN HFA) 108 (90 Base) MCG/ACT inhaler Inhale 2 puffs into the lungs every 4 (four) hours as needed for shortness of breath or wheezing. Patient not taking: Reported on 12/12/2021 04/08/21   Clapacs, Jackquline Denmark, MD  amLODipine (NORVASC) 5  MG tablet Take 1 tablet (5 mg total) by mouth daily. Patient not taking: Reported on 12/12/2021 07/08/21 10/06/21  Kathlen Mody, MD  FLUoxetine (PROZAC) 40 MG capsule Take 1 capsule (40 mg total) by mouth every morning. Patient not taking: Reported on 12/12/2021 07/08/21   Kathlen Mody, MD   gabapentin (NEURONTIN) 400 MG capsule Take 800 mg by mouth 3 (three) times daily. Patient not taking: Reported on 12/12/2021    [provider]  hydrOXYzine (ATARAX) 25 MG tablet Take 2 tablets (50 mg total) by mouth 3 (three) times daily as needed for anxiety. Patient not taking: Reported on 12/12/2021 07/08/21   Kathlen Mody, MD  ipratropium-albuterol (DUONEB) 0.5-2.5 (3) MG/3ML SOLN USe 1 vial by nebulization every 6 (six) hours as needed (Use FIRST). Patient not taking: Reported on 12/12/2021 07/08/21   Kathlen Mody, MD  mometasone-formoterol [facility]) 200-5 MCG/ACT AERO Inhale 2 puffs into the lungs 2 (two) times daily. Patient not taking: Reported on 12/12/2021 07/09/21   Kathlen Mody, MD  montelukast (SINGULAIR) 10 MG tablet Take 1 tablet (10 mg total) by mouth at bedtime. Patient not taking: Reported on 12/12/2021 07/08/21   Kathlen Mody, MD  nicotine (NICODERM CQ - DOSED IN MG/24 HOURS) 21 mg/24hr patch Place 1 patch (21 mg total) onto the skin daily. Patient not taking: Reported on 12/12/2021 07/08/21   Kathlen Mody, MD  prazosin (MINIPRESS) 1 MG capsule Take 1 capsule (1 mg total) by mouth at bedtime. Patient not taking: Reported on 12/12/2021 07/08/21   Kathlen Mody, MD  QUEtiapine (SEROQUEL) 100 MG tablet Take 1 tablet (100 mg total) by mouth at bedtime. Patient not taking: Reported on 12/12/2021 04/08/21   Clapacs, Jackquline Denmark, MD  traZODone (DESYREL) 150 MG tablet Take 1 tablet (150 mg total) by mouth at bedtime as needed for sleep. Patient not taking: Reported on 12/12/2021 04/08/21   Clapacs, Jackquline Denmark, MD  citalopram (CELEXA) 20 MG tablet Take 1 tablet (20 mg total) by mouth daily. 03/07/19 07/23/19  Aldean Baker, NP  lisinopril (ZESTRIL) 10 MG tablet Take 1 tablet (10 mg total) by mouth daily. 03/07/19 09/11/19  Aldean Baker, NP  pantoprazole (PROTONIX) 40 MG tablet Take 1 tablet (40 mg total) by mouth daily. 03/07/19 07/23/19  Aldean Baker, NP                                                                                                                                     Allergies Patient has no known allergies.  Review of Systems Review of Systems As noted in HPI  Physical Exam Vital Signs  I have reviewed the triage vital signs BP 120/65   Pulse 99   Temp 98.8 F (37.1 C) (Oral)   Resp 20   Ht 5\' 6"  (1.676 m)   Wt 93 kg   LMP 04/19/2018   SpO2 90%   BMI 33.09 kg/m   Physical Exam  Vitals reviewed.  Constitutional:      General: She is not in acute distress.    Appearance: She is well-developed. She is not diaphoretic.  HENT:     Head: Normocephalic and atraumatic.     Nose: Nose normal.  Eyes:     General: No scleral icterus.       Right eye: No discharge.        Left eye: No discharge.     Conjunctiva/sclera: Conjunctivae normal.     Pupils: Pupils are equal, round, and reactive to light.  Cardiovascular:     Rate and Rhythm: Normal rate and regular rhythm.     Heart sounds: No murmur heard.    No friction rub. No gallop.  Pulmonary:     Effort: Tachypnea, prolonged expiration and respiratory distress present.     Breath sounds: No stridor. Wheezing (diffuse expiratory) and rhonchi present. No rales.  Abdominal:     General: There is no distension.     Palpations: Abdomen is soft.     Tenderness: There is no abdominal tenderness.  Musculoskeletal:        General: No tenderness.     Cervical back: Normal range of motion and neck supple.     Right lower leg: No edema.     Left lower leg: No edema.  Skin:    General: Skin is warm and dry.     Findings: No erythema or rash.  Neurological:     Mental Status: She is alert and oriented to person, place, and time.     ED Results and Treatments Labs (all labs ordered are listed, but only abnormal results are displayed) Labs Reviewed  CBC WITH DIFFERENTIAL/PLATELET - Abnormal; Notable for the following components:      Result Value   WBC 15.3 (*)    RDW 15.6 (*)    Neutro Abs  10.3 (*)    Monocytes Absolute 1.7 (*)    All other components within normal limits  COMPREHENSIVE METABOLIC PANEL - Abnormal; Notable for the following components:   Potassium 3.4 (*)    Glucose, Bld 179 (*)    Albumin 3.3 (*)    AST 14 (*)    All other components within normal limits  CBC - Abnormal; Notable for the following components:   WBC 11.9 (*)    RBC 3.86 (*)    Hemoglobin 11.9 (*)    All other components within normal limits  I-STAT VENOUS BLOOD GAS, ED - Abnormal; Notable for the following components:   pH, Ven 7.623 (*)    pCO2, Ven 28.0 (*)    pO2, Ven 153 (*)    Bicarbonate 29.0 (*)    Acid-Base Excess 8.0 (*)    Sodium 126 (*)    Potassium >8.5 (*)    Calcium, Ion 0.75 (*)    All other components within normal limits  I-STAT CHEM 8, ED - Abnormal; Notable for the following components:   Potassium 3.4 (*)    Glucose, Bld 163 (*)    All other components within normal limits  CBG MONITORING, ED - Abnormal; Notable for the following components:   Glucose-Capillary 278 (*)    All other components within normal limits  RESP PANEL BY RT-PCR (FLU A&B, COVID) ARPGX2  CULTURE, BLOOD (ROUTINE X 2)  CULTURE, BLOOD (ROUTINE X 2)  LACTIC ACID, PLASMA  MAGNESIUM  I-STAT BETA HCG BLOOD, ED ([facility], [facility], [facility] ONLY)  TROPONIN I (HIGH SENSITIVITY)  EKG  EKG Interpretation  Date/Time:  Saturday December 12 2021 01:58:32 EST Ventricular Rate:  111 PR Interval:  140 QRS Duration: 86 QT Interval:  331 QTC Calculation: 450 R Axis:   59 Text Interpretation: Sinus tachycardia Right atrial enlargement Left ventricular hypertrophy Artifact in lead(s) I II aVR aVL Confirmed by Drema Pryardama, Alrick Cubbage [phone]([id number]) on 12/12/2021 3:05:16 AM       Radiology DG Chest Port 1 View  Result Date: 12/12/2021 CLINICAL DATA:  Respiratory distress EXAM: PORTABLE CHEST 1 VIEW COMPARISON:   09/23/2021 FINDINGS: Cardiac shadow is stable. Lungs are well aerated bilaterally. Patchy airspace opacity is noted in the right base consistent with acute pneumonia. No bony abnormality is seen. IMPRESSION: Right basilar pneumonia. Electronically Signed   By: Alcide CleverMark  Lukens M.D.   On: 12/12/2021 02:21    Medications Ordered in ED Medications  cefTRIAXone (ROCEPHIN) 2 g in sodium chloride 0.9 % 100 mL IVPB (0 g Intravenous Stopped 12/12/21 0414)  azithromycin (ZITHROMAX) 500 mg in sodium chloride 0.9 % 250 mL IVPB (0 mg Intravenous Stopped 12/12/21 0529)  enoxaparin (LOVENOX) injection 40 mg (has no administration in time range)  acetaminophen (TYLENOL) tablet 650 mg (has no administration in time range)    Or  acetaminophen (TYLENOL) suppository 650 mg (has no administration in time range)  levalbuterol (XOPENEX) nebulizer solution 0.63 mg (has no administration in time range)  ipratropium (ATROVENT) nebulizer solution 0.5 mg (has no administration in time range)  methylPREDNISolone sodium succinate (SOLU-MEDROL) 125 mg/2 mL injection 60 mg (60 mg Intravenous Given 12/12/21 0639)  budesonide (PULMICORT) nebulizer solution 0.25 mg (has no administration in time range)  guaiFENesin (MUCINEX) 12 hr tablet 600 mg (has no administration in time range)  nicotine (NICODERM CQ - dosed in mg/24 hours) patch 14 mg (has no administration in time range)  magnesium sulfate IVPB 2 g 50 mL (0 g Intravenous Stopped 12/12/21 0307)  ipratropium (ATROVENT) nebulizer solution 0.5 mg (0.5 mg Nebulization Given 12/12/21 0202)  sodium chloride 0.9 % bolus 1,000 mL (0 mLs Intravenous Stopped 12/12/21 0434)  potassium chloride SA (KLOR-CON M) CR tablet 40 mEq (40 mEq Oral Given 12/12/21 0636)                                                                                                                                     Procedures .Critical Care  Performed by: Nira Connardama, Palmira Stickle Eduardo, MD Authorized by: Nira Connardama,  Ott Zimmerle Eduardo, MD   Critical care provider statement:    Critical care time (minutes):  45   Critical care time was exclusive of:  Separately billable procedures and treating other patients   Critical care was necessary to treat or prevent imminent or life-threatening deterioration of the following conditions:  Respiratory failure   Critical care was time spent personally by me on the following activities:  Development of treatment plan with patient or surrogate, discussions with consultants, evaluation of patient's response  to treatment, examination of patient, obtaining history from patient or surrogate, review of old charts, re-evaluation of patient's condition, pulse oximetry, ordering and review of radiographic studies, ordering and review of laboratory studies and ordering and performing treatments and interventions   (including critical care time)  Medical Decision Making / ED Course   Medical Decision Making Amount and/or Complexity of Data Reviewed Labs: ordered. Decision-making details documented in ED Course. Radiology: ordered and independent interpretation performed. Decision-making details documented in ED Course. ECG/medicine tests: ordered and independent interpretation performed. Decision-making details documented in ED Course.  Risk Prescription drug management. Decision regarding hospitalization.    Respiratory distress consistent with asthma/COPD exacerbation. We will rule out pneumonia.  Less concern for pneumothorax but will assess this on chest x-ray. We will get screening labs to assess for any electrolyte or metabolic derangements, renal sufficiency. We will also assess for anemia.  Patient is finishing up her second DuoNeb by EMS.  Will place on continuous albuterol and third round of Atrovent.  We will give IV magnesium.  Chest x-ray was notable for right lower lobe pneumonia.  CBC with leukocytosis. Code sepsis initiated and patient started on empiric  antibiotics for community-acquired pneumonia.  Rest of the work-up was reassuring with a CMP without significant electrolyte derangements or renal sufficiency. Lactic acid normal.  On reassessment, patient reports improved shortness of breath but still has diffuse rhonchi.  She is satting well on her baseline 4 L nasal cannula.   Final Clinical Impression(s) / ED Diagnoses Final diagnoses:  Pneumonia of right lower lobe due to infectious organism  COPD exacerbation (HCC)  Respiratory distress           This chart was dictated using voice recognition software.  Despite best efforts to proofread,  errors can occur which can change the documentation meaning.    Nira Conn, MD 12/12/21 [phone]

## 2021-12-12 NOTE — ED Notes (Signed)
Per Orland Penman , RN ok to bring pt up to room

## 2021-12-12 NOTE — ED Notes (Signed)
Got patient some warm blankets patient is resting with call bell in reach  

## 2021-12-12 NOTE — ED Notes (Signed)
Checked patient cbg it was 56 notified RN Josh of blood sugar patient is resting with call bell in reach

## 2021-12-12 NOTE — H&P (Addendum)
History and Physical    Rachel Nolan MBE:[id number] DOB: 12/12/1970 DOA: 12/12/2021  PCP: Patient, No Pcp Per  Chief Complaint: Shortness of breath  HPI: Rachel Nolan is a 51 y.o. female with medical history significant of asthma/COPD, chronic hypoxemic respiratory failure on 2 L O2, depression, anxiety, hypertension, polysubstance abuse (crack cocaine, marijuana, tobacco) presented to the ED with shortness of breath and wheezing.  EMS gave Solu-Medrol and DuoNeb x 2 without much improvement.  Patient was given additional Atrovent and continuous albuterol neb treatment along with IV magnesium 2 g in the ED. Oxygen saturation 92-98% with ambulation on 5 L O2.  Afebrile.  Labs showing WBC 15.3, potassium 3.4, glucose 179, VBG without evidence of acidosis or hypercapnia, COVID and flu negative, blood cultures drawn, lactic acid normal.  Chest x-ray showing right basilar pneumonia. Additional medications administered in the ED include ceftriaxone, azithromycin, and 1 L LR bolus.  TRH called to admit.  Patient states she was previously using 2 L oxygen with exertion but was seen at a hospital in [place] a month ago and advised to start using 5 L O2 with exertion.  She reports worsening shortness of breath, cough, and wheezing for the past 1 day despite using her home inhalers and nebulizer machine.  Also reporting substernal chest tightness since the onset of her symptoms yesterday.  She reports ongoing crack cocaine use and smokes 1/2 pack of cigarettes daily.  Denies fevers.  No other complaints.  Review of Systems:  Review of Systems  All other systems reviewed and are negative.   Past Medical History:  Diagnosis Date   Anxiety    Asthma    COPD (chronic obstructive pulmonary disease) ([facility])    Depression    Hypertension    Scoliosis     Past Surgical History:  Procedure Laterality Date   BACK SURGERY     ECTOPIC PREGNANCY SURGERY       reports that she has been smoking  cigarettes. She has been smoking an average of .5 packs per day. She has never used smokeless tobacco. She reports current alcohol use. She reports current drug use. Drugs: Marijuana, Cocaine, and "Crack" cocaine.  No Known Allergies  Family History  Problem Relation Age of Onset   Hypertension Mother    Hypertension Father     Prior to Admission medications   Medication Sig Start Date End Date Taking? Authorizing Provider  albuterol (PROVENTIL) (2.5 MG/3ML) 0.083% nebulizer solution Take 3 mLs (2.5 mg total) by nebulization every 4 (four) hours as needed for wheezing or shortness of breath. 05/13/21 05/13/22  Pokhrel, Corrie Mckusick, MD  albuterol (VENTOLIN HFA) 108 (90 Base) MCG/ACT inhaler Inhale 2 puffs into the lungs every 4 (four) hours as needed for shortness of breath or wheezing. Patient taking differently: Inhale 2 puffs into the lungs every 6 (six) hours as needed for shortness of breath or wheezing. 04/08/21   Clapacs, Madie Reno, MD  amLODipine (NORVASC) 5 MG tablet Take 1 tablet (5 mg total) by mouth daily. Patient taking differently: Take 10 mg by mouth daily. 07/08/21 10/06/21  Hosie Poisson, MD  FLUoxetine (PROZAC) 40 MG capsule Take 1 capsule (40 mg total) by mouth every morning. 07/08/21   Hosie Poisson, MD  gabapentin (NEURONTIN) 400 MG capsule Take 800 mg by mouth 3 (three) times daily.    [provider]  hydrOXYzine (ATARAX) 25 MG tablet Take 2 tablets (50 mg total) by mouth 3 (three) times daily as needed for anxiety. 07/08/21  Hosie Poisson, MD  ipratropium-albuterol (DUONEB) 0.5-2.5 (3) MG/3ML SOLN USe 1 vial by nebulization every 6 (six) hours as needed (Use FIRST). Patient taking differently: Take 3 mLs by nebulization every 6 (six) hours as needed (shortness of breath/wheezing (use FIRST)). 07/08/21   Hosie Poisson, MD  mometasone-formoterol (DULERA) 200-5 MCG/ACT AERO Inhale 2 puffs into the lungs 2 (two) times daily. 07/09/21   Hosie Poisson, MD  montelukast (SINGULAIR) 10  MG tablet Take 1 tablet (10 mg total) by mouth at bedtime. 07/08/21   Hosie Poisson, MD  nicotine (NICODERM CQ - DOSED IN MG/24 HOURS) 21 mg/24hr patch Place 1 patch (21 mg total) onto the skin daily. 07/08/21   Hosie Poisson, MD  prazosin (MINIPRESS) 1 MG capsule Take 1 capsule (1 mg total) by mouth at bedtime. 07/08/21   Hosie Poisson, MD  predniSONE (DELTASONE) 20 MG tablet 2 tabs po daily x 3 days 09/23/21   Raiford Noble Latif, DO  QUEtiapine (SEROQUEL) 100 MG tablet Take 1 tablet (100 mg total) by mouth at bedtime. 04/08/21   Clapacs, Madie Reno, MD  traMADol (ULTRAM) 50 MG tablet Take 50 mg by mouth every 6 (six) hours as needed (pain).    [provider]  traZODone (DESYREL) 150 MG tablet Take 1 tablet (150 mg total) by mouth at bedtime as needed for sleep. Patient taking differently: Take 150 mg by mouth at bedtime. 04/08/21   Clapacs, Madie Reno, MD  citalopram (CELEXA) 20 MG tablet Take 1 tablet (20 mg total) by mouth daily. 03/07/19 07/23/19  Connye Burkitt, NP  lisinopril (ZESTRIL) 10 MG tablet Take 1 tablet (10 mg total) by mouth daily. 03/07/19 09/11/19  Connye Burkitt, NP  pantoprazole (PROTONIX) 40 MG tablet Take 1 tablet (40 mg total) by mouth daily. 03/07/19 07/23/19  Connye Burkitt, NP    Physical Exam: Vitals:   12/12/21 0300 12/12/21 0330 12/12/21 0413 12/12/21 0430  BP: 120/67 112/72 117/70 120/65  Pulse: (!) 106 (!) 103 94 100  Resp:   18 20  Temp:      TempSrc:      SpO2: 98% 97% 99% 100%  Weight:      Height:        Physical Exam Vitals reviewed.  Constitutional:      General: She is not in acute distress. HENT:     Head: Normocephalic and atraumatic.  Eyes:     Extraocular Movements: Extraocular movements intact.  Cardiovascular:     Rate and Rhythm: Normal rate and regular rhythm.     Pulses: Normal pulses.  Pulmonary:     Effort: No respiratory distress.     Breath sounds: Wheezing present. No rales.  Abdominal:     General: Bowel sounds are normal. There is no  distension.     Palpations: Abdomen is soft.     Tenderness: There is no abdominal tenderness.  Musculoskeletal:     Cervical back: Normal range of motion.     Right lower leg: No edema.     Left lower leg: No edema.  Skin:    General: Skin is warm and dry.  Neurological:     General: No focal deficit present.     Mental Status: She is alert and oriented to person, place, and time.     Labs on Admission: I have personally reviewed following labs and imaging studies  CBC: Recent Labs  Lab 12/12/21 0211 12/12/21 0223 12/12/21 0248  WBC 15.3*  --   --  NEUTROABS 10.3*  --   --   HGB 13.6 14.3 13.6  HCT 39.9 42.0 40.0  MCV 93.2  --   --   PLT 225  --   --    Basic Metabolic Panel: Recent Labs  Lab 12/12/21 0223 12/12/21 0248 12/12/21 0325  NA 126* 138 138  K >8.5* 3.4* 3.4*  CL  --  100 99  CO2  --   --  27  GLUCOSE  --  163* 179*  BUN  --  13 13  CREATININE  --  0.60 0.80  CALCIUM  --   --  9.3   GFR: Estimated Creatinine Clearance: 93.5 mL/min (by C-G formula based on SCr of 0.8 mg/dL). Liver Function Tests: Recent Labs  Lab 12/12/21 0325  AST 14*  ALT 13  ALKPHOS 58  BILITOT 0.4  PROT 6.7  ALBUMIN 3.3*   No results for input(s): "LIPASE", "AMYLASE" in the last 168 hours. No results for input(s): "AMMONIA" in the last 168 hours. Coagulation Profile: No results for input(s): "INR", "PROTIME" in the last 168 hours. Cardiac Enzymes: No results for input(s): "CKTOTAL", "CKMB", "CKMBINDEX", "TROPONINI" in the last 168 hours. BNP (last 3 results) No results for input(s): "PROBNP" in the last 8760 hours. HbA1C: No results for input(s): "HGBA1C" in the last 72 hours. CBG: No results for input(s): "GLUCAP" in the last 168 hours. Lipid Profile: No results for input(s): "CHOL", "HDL", "LDLCALC", "TRIG", "CHOLHDL", "LDLDIRECT" in the last 72 hours. Thyroid Function Tests: No results for input(s): "TSH", "T4TOTAL", "FREET4", "T3FREE", "THYROIDAB" in the last  72 hours. Anemia Panel: No results for input(s): "VITAMINB12", "FOLATE", "FERRITIN", "TIBC", "IRON", "RETICCTPCT" in the last 72 hours. Urine analysis:    Component Value Date/Time   COLORURINE STRAW (A) 07/14/2020 0600   APPEARANCEUR CLEAR 07/14/2020 0600   LABSPEC 1.015 07/14/2020 0600   PHURINE 7.0 07/14/2020 0600   GLUCOSEU NEGATIVE 07/14/2020 0600   HGBUR NEGATIVE 07/14/2020 0600   BILIRUBINUR NEGATIVE 07/14/2020 0600   KETONESUR NEGATIVE 07/14/2020 0600   PROTEINUR NEGATIVE 07/14/2020 0600   NITRITE NEGATIVE 07/14/2020 0600   LEUKOCYTESUR NEGATIVE 07/14/2020 0600    Radiological Exams on Admission: DG Chest Port 1 View  Result Date: 12/12/2021 CLINICAL DATA:  Respiratory distress EXAM: PORTABLE CHEST 1 VIEW COMPARISON:  09/23/2021 FINDINGS: Cardiac shadow is stable. Lungs are well aerated bilaterally. Patchy airspace opacity is noted in the right base consistent with acute pneumonia. No bony abnormality is seen. IMPRESSION: Right basilar pneumonia. Electronically Signed   By: Inez Catalina M.D.   On: 12/12/2021 02:21    EKG: Independently reviewed.  Sinus tachycardia, no acute ischemic changes.  Assessment and Plan  Acute exacerbation of asthma/COPD Acute on chronic hypoxemic respiratory failure She was previously on 2 L of oxygen as needed and currently requiring 5 L at rest to maintain sats in the 90s.  Continues to have wheezing despite Solu-Medrol, bronchodilator treatments, and IV magnesium.  VBG without evidence of acidosis or hypercapnia.  Patient told me that she is using her home inhalers and nebs but told pharmacy technician that she is not taking any of her home medications. -Solu-Medrol 60 mg every 12 hours -Xopenex and Atrovent every 6 hours -Pulmicort neb twice daily -Incentive spirometry, flutter valve -Mucinex -Continue supplemental oxygen, wean as tolerated -Smoking cessation counseling  Sepsis secondary to community-acquired pneumonia Met criteria for  sepsis at the time of presentation with tachycardia and leukocytosis.  Chest x-ray showing right basilar pneumonia.  Tachycardia improved with IV  fluids.  No lactic acidosis or hypotension.  COVID and flu negative. -Continue ceftriaxone and azithromycin -Blood cultures pending -Monitor WBC count  Mild hypokalemia -Replace potassium and check mag level  Hyperglycemia Likely secondary to steroids.  Glucose in the 170s.  A1c 5.6 on 03/28/2021. -Continue CBG checks ACHS and may need sliding scale insulin  Hypertension Currently normotensive. -Avoid beta-blockers given ongoing cocaine abuse  Ongoing polysubstance abuse (crack cocaine, tobacco) -Counseled to quit -Nicotine patch  Addendum 12/12/2020 at 6:27 AM: Chest pain Likely due to asthma/COPD exacerbation and cocaine use.  EKG without acute ischemic changes.  Appears comfortable on exam. -Check troponin  DVT prophylaxis: Lovenox Code Status: Full Code Family Communication: No family available at this time. Level of care: Telemetry bed Admission status: It is my clinical opinion that admission to INPATIENT is reasonable and necessary because of the expectation that this patient will require hospital care that crosses at least 2 midnights to treat this condition based on the medical complexity of the problems presented.  Given the aforementioned information, the predictability of an adverse outcome is felt to be significant.   Shela Leff MD [facility]  If 7PM-7AM, please contact night-coverage [email or web address]  12/12/2021, 5:18 AM

## 2021-12-12 NOTE — Progress Notes (Addendum)
Patient is admitted early this am, detail please see HPI She is seen and examined, continue to have congested cough, cough is productive, diffuse bilateral wheezing on exam, no edema, she is on 2liter oxygen at rest which is her baseline by report. Will add on respiratory viral panel, report symptom started after thankgiving party. Also has h/o chronic cocaine use. Cxr with RLL infiltrate, check procal, urine strep, urine legionella antigen, sputum culture, continue abx/nebs/steroids/mucinex, she also received iv mag this am,  Resume home meds neurontin, prozac, seroqual , minipress, prn hydroxyzine.   Patient reports she just got new cell phone yesterday, #[phone].
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
## 2021-12-12 NOTE — ED Notes (Signed)
RN called to bedside for pt c/o pain in left forearm. IV inflitrated. PIV removed at this time.

## 2021-12-13 DIAGNOSIS — J441 Chronic obstructive pulmonary disease with (acute) exacerbation: Secondary | ICD-10-CM | POA: Diagnosis not present

## 2021-12-13 LAB — STREP PNEUMONIAE URINARY ANTIGEN: Strep Pneumo Urinary Antigen: NEGATIVE

## 2021-12-13 LAB — CBC WITH DIFFERENTIAL/PLATELET
Abs Immature Granulocytes: 0.09 10*3/uL — ABNORMAL HIGH (ref 0.00–0.07)
Basophils Absolute: 0 10*3/uL (ref 0.0–0.1)
Basophils Relative: 0 %
Eosinophils Absolute: 0 10*3/uL (ref 0.0–0.5)
Eosinophils Relative: 0 %
HCT: 32.8 % — ABNORMAL LOW (ref 36.0–46.0)
Hemoglobin: 10.9 g/dL — ABNORMAL LOW (ref 12.0–15.0)
Immature Granulocytes: 1 %
Lymphocytes Relative: 13 %
Lymphs Abs: 1.9 10*3/uL (ref 0.7–4.0)
MCH: 31 pg (ref 26.0–34.0)
MCHC: 33.2 g/dL (ref 30.0–36.0)
MCV: 93.2 fL (ref 80.0–100.0)
Monocytes Absolute: 1 10*3/uL (ref 0.1–1.0)
Monocytes Relative: 7 %
Neutro Abs: 11.4 10*3/uL — ABNORMAL HIGH (ref 1.7–7.7)
Neutrophils Relative %: 79 %
Platelets: 214 10*3/uL (ref 150–400)
RBC: 3.52 MIL/uL — ABNORMAL LOW (ref 3.87–5.11)
RDW: 14.6 % (ref 11.5–15.5)
WBC: 14.4 10*3/uL — ABNORMAL HIGH (ref 4.0–10.5)
nRBC: 0 % (ref 0.0–0.2)

## 2021-12-13 LAB — BASIC METABOLIC PANEL
Anion gap: 8 (ref 5–15)
BUN: 11 mg/dL (ref 6–20)
CO2: 28 mmol/L (ref 22–32)
Calcium: 8.8 mg/dL — ABNORMAL LOW (ref 8.9–10.3)
Chloride: 107 mmol/L (ref 98–111)
Creatinine, Ser: 0.74 mg/dL (ref 0.44–1.00)
GFR, Estimated: >60 mL/min (ref >60)
Glucose, Bld: 121 mg/dL — ABNORMAL HIGH (ref 70–99)
Potassium: 3.8 mmol/L (ref 3.5–5.1)
Sodium: 143 mmol/L (ref 135–145)

## 2021-12-13 LAB — PROCALCITONIN: Procalcitonin: <0.1 ng/mL

## 2021-12-13 LAB — GLUCOSE, CAPILLARY
Glucose-Capillary: 118 mg/dL — ABNORMAL HIGH (ref 70–99)
Glucose-Capillary: 207 mg/dL — ABNORMAL HIGH (ref 70–99)
Glucose-Capillary: 124 mg/dL — ABNORMAL HIGH (ref 70–99)
Glucose-Capillary: 117 mg/dL — ABNORMAL HIGH (ref 70–99)

## 2021-12-13 LAB — PHOSPHORUS: Phosphorus: 2.4 mg/dL — ABNORMAL LOW (ref 2.5–4.6)

## 2021-12-13 MED ORDER — INSULIN ASPART 100 UNIT/ML IJ SOLN: 2.0000 [IU] | Freq: Three times a day (TID) | INTRAMUSCULAR | Status: DC

## 2021-12-13 MED ORDER — INSULIN ASPART 100 UNIT/ML IJ SOLN
0.0000 [IU] | Freq: Three times a day (TID) | INTRAMUSCULAR | Status: DC
  Administered 2021-12-13 – 2021-12-14 (×2): 2 [IU] via SUBCUTANEOUS
  Administered 2021-12-14: 3 [IU] via SUBCUTANEOUS
  Administered 2021-12-15: 2 [IU] via SUBCUTANEOUS

## 2021-12-13 MED ORDER — PREDNISONE 50 MG PO TABS
60.0000 mg | ORAL_TABLET | Freq: Every day | ORAL | Status: DC
  Administered 2021-12-14 – 2021-12-15 (×2): 60 mg via ORAL
  Filled 2021-12-13 (×2): qty 1

## 2021-12-13 MED ORDER — SODIUM PHOSPHATES 45 MMOLE/15ML IV SOLN
15.0000 mmol | Freq: Once | INTRAVENOUS | Status: AC
  Administered 2021-12-13: 15 mmol via INTRAVENOUS
  Filled 2021-12-13: qty 5

## 2021-12-13 NOTE — Progress Notes (Signed)
PROGRESS NOTE    Rachel Nolan  PPJ:[id number] DOB: 1970-03-02 DOA: 12/12/2021 PCP: Patient, No Pcp Per     Brief Narrative:  asthma/COPD, chronic hypoxemic respiratory failure on 2 L O2, depression, anxiety, hypertension, polysubstance abuse (crack cocaine, marijuana, tobacco) presented to the ED with shortness of breath and wheezing.     She reports ongoing crack cocaine use and smokes 1/2 pack of cigarettes daily.   Subjective:  Less wheezing, less cough  Assessment & Plan:  Principal Problem:   COPD with acute exacerbation (HCC) Active Problems:   Essential hypertension   Tobacco abuse   History of substance abuse (HCC)   Acute on chronic respiratory failure with hypoxemia (HCC)   Sepsis (HCC)   CAP (community acquired pneumonia)   Hypokalemia   Hyperglycemia   Acute exacerbation of asthma/COPD Acute on chronic hypoxic respiratory failure ( on 2liter at baseline) Sepsis secondary to community-acquired pneumonia/RLL lobar pneumonia -Negative flu and COVID, + rhinovirus -Blood culture no growth so far, procalcitonin less than 0.1 -Urine Legionella antigen ,urine strep pneumo antigen, sputum culture ordered, pending collection -improving, taper steroids, continue antibiotic, bronchodilator, Mucinex, Singulair  Hypophosphatemia Replace phos, recheck in the morning Hypokalemia, replaced and improved, mag 2.2    Hypertension -Currently normotensive without norvasc (home meds) -Avoid beta-blockers given ongoing cocaine abuse     Anxiety /depression -Continue home medication Prozac, Seroquel, Minipress, as needed hydroxyzine -She is also taking Neurontin 800 mg 3 times a day, continued    Body mass index is 33.09 kg/m.Marland Kitchen  Meet obesity criteria  .FTT, walks with a cane, PT eval  Ongoing polysubstance abuse (crack cocaine, tobacco) -Counseled to quit -Nicotine patch   She reports she is homeless, currently lives at her sister's home, Child psychotherapist  consulted  I have Reviewed nursing notes, Vitals, pain scores, I/o's, Lab results and  imaging results since pt's last encounter, details please see discussion above  I ordered the following labs:  Unresulted Labs (From admission, onward)     Start     Ordered   12/14/21 0500  CBC with Differential/Platelet  Tomorrow morning,   R       Question:  Specimen collection method  Answer:  Lab=Lab collect   12/13/21 0812   12/14/21 0500  Basic metabolic panel  Tomorrow morning,   R       Question:  Specimen collection method  Answer:  Lab=Lab collect   12/13/21 0812   12/14/21 0500  Phosphorus  Tomorrow morning,   R       Question:  Specimen collection method  Answer:  Lab=Lab collect   12/13/21 0812   12/14/21 0500  Magnesium  Tomorrow morning,   R       Question:  Specimen collection method  Answer:  Lab=Lab collect   12/13/21 0822   12/14/21 0500  Hemoglobin A1c  Tomorrow morning,   R       Comments: To assess prior glycemic control   Question:  Specimen collection method  Answer:  Lab=Lab collect   12/13/21 1633   12/13/21 0500  Procalcitonin  Daily at 5am,   R      12/12/21 1757   12/12/21 1751  Strep pneumoniae urinary antigen  Once,   R        12/12/21 1750   12/12/21 1751  Legionella Pneumophila Serogp 1 Ur Ag  Once,   R        12/12/21 1750   12/12/21 1750  Expectorated Sputum Assessment w Gram  Stain, Rflx to Resp Cult  Once,   R        12/12/21 1749             DVT prophylaxis: enoxaparin (LOVENOX) injection 40 mg Start: 12/12/21 1000   Code Status:   Code Status: Full Code  Family Communication: none at bedside  Disposition:   Dispo: The patient is from: sister's house               Anticipated d/c is to: TBD              Anticipated d/c date is: 24-48hrs   Antimicrobials:    Anti-infectives (From admission, onward)    Start     Dose/Rate Route Frequency Ordered Stop   12/12/21 0315  cefTRIAXone (ROCEPHIN) 2 g in sodium chloride 0.9 % 100 mL IVPB        2  g 200 mL/hr over 30 Minutes Intravenous Every 24 hours 12/12/21 0306 12/17/21 0314   12/12/21 0315  azithromycin (ZITHROMAX) 500 mg in sodium chloride 0.9 % 250 mL IVPB        500 mg 250 mL/hr over 60 Minutes Intravenous Every 24 hours 12/12/21 0306 12/17/21 0314          Objective: Vitals:   12/13/21 0954 12/13/21 0957 12/13/21 1522 12/13/21 1623  BP:    137/89  Pulse:    99  Resp:    17  Temp:    98.2 F (36.8 C)  TempSrc:    Oral  SpO2: 96% 96% 98% 95%  Weight:      Height:        Intake/Output Summary (Last 24 hours) at 12/13/2021 1636 Last data filed at 12/13/2021 1628 Gross per 24 hour  Intake 911.22 ml  Output 750 ml  Net 161.22 ml   Filed Weights   12/12/21 0216  Weight: 93 kg    Examination:  General exam: alert, awake,  Respiratory system: Bilateral wheezing has much improved, tachypnea has resolved, respiratory effort normal. Cardiovascular system:  RRR.  Gastrointestinal system: Abdomen is nondistended, soft and nontender.  Normal bowel sounds heard. Central nervous system: Alert and oriented. No focal neurological deficits. Extremities:  no edema Skin: No rashes, lesions or ulcers Psychiatry: Judgement and insight appear normal. Mood & affect appropriate.     Data Reviewed: I have personally reviewed  labs and visualized  imaging studies since the last encounter and formulate the plan        Scheduled Meds:  budesonide (PULMICORT) nebulizer solution  0.25 mg Nebulization BID   enoxaparin (LOVENOX) injection  40 mg Subcutaneous Q24H   FLUoxetine  40 mg Oral q morning   gabapentin  800 mg Oral TID   guaiFENesin  600 mg Oral BID   insulin aspart  0-15 Units Subcutaneous TID WC   ipratropium  0.5 mg Nebulization Q6H   levalbuterol  0.63 mg Nebulization Q6H   montelukast  10 mg Oral QHS   nicotine  14 mg Transdermal Daily   prazosin  1 mg Oral QHS   [START ON 12/14/2021] predniSONE  60 mg Oral Q breakfast   QUEtiapine  100 mg Oral QHS    Continuous Infusions:  azithromycin Stopped (12/13/21 0450)   cefTRIAXone (ROCEPHIN)  IV Stopped (12/13/21 0321)     LOS: 1 day     Albertine Grates, MD PhD FACP [facility]  Available via Epic secure chat 7am-7pm for nonurgent issues Please page for urgent issues To page the attending  provider between 7A-7P or the covering provider during after hours 7P-7A, please log into the web site [email or web address] and access using universal [place] password for that web site. If you do not have the password, please call the hospital operator.    12/13/2021, 4:36 PM

## 2021-12-14 ENCOUNTER — Inpatient Hospital Stay (HOSPITAL_COMMUNITY): Payer: Commercial Managed Care - HMO

## 2021-12-14 ENCOUNTER — Other Ambulatory Visit (HOSPITAL_COMMUNITY): Payer: Commercial Managed Care - HMO

## 2021-12-14 ENCOUNTER — Telehealth (HOSPITAL_COMMUNITY): Payer: Self-pay | Admitting: Internal Medicine

## 2021-12-14 DIAGNOSIS — J441 Chronic obstructive pulmonary disease with (acute) exacerbation: Secondary | ICD-10-CM | POA: Diagnosis not present

## 2021-12-14 LAB — CBC WITH DIFFERENTIAL/PLATELET
Abs Immature Granulocytes: 0.16 10*3/uL — ABNORMAL HIGH (ref 0.00–0.07)
Basophils Absolute: 0 10*3/uL (ref 0.0–0.1)
Basophils Relative: 0 %
Eosinophils Absolute: 0 10*3/uL (ref 0.0–0.5)
Eosinophils Relative: 0 %
HCT: 33 % — ABNORMAL LOW (ref 36.0–46.0)
Hemoglobin: 11.1 g/dL — ABNORMAL LOW (ref 12.0–15.0)
Immature Granulocytes: 2 %
Lymphocytes Relative: 23 %
Lymphs Abs: 2.6 10*3/uL (ref 0.7–4.0)
MCH: 31.2 pg (ref 26.0–34.0)
MCHC: 33.6 g/dL (ref 30.0–36.0)
MCV: 92.7 fL (ref 80.0–100.0)
Monocytes Absolute: 0.8 10*3/uL (ref 0.1–1.0)
Monocytes Relative: 8 %
Neutro Abs: 7.4 10*3/uL (ref 1.7–7.7)
Neutrophils Relative %: 67 %
Platelets: 219 10*3/uL (ref 150–400)
RBC: 3.56 MIL/uL — ABNORMAL LOW (ref 3.87–5.11)
RDW: 14.6 % (ref 11.5–15.5)
WBC: 11 10*3/uL — ABNORMAL HIGH (ref 4.0–10.5)
nRBC: 0 % (ref 0.0–0.2)

## 2021-12-14 LAB — BASIC METABOLIC PANEL
Anion gap: 11 (ref 5–15)
BUN: 17 mg/dL (ref 6–20)
CO2: 27 mmol/L (ref 22–32)
Calcium: 8.6 mg/dL — ABNORMAL LOW (ref 8.9–10.3)
Chloride: 102 mmol/L (ref 98–111)
Creatinine, Ser: 0.69 mg/dL (ref 0.44–1.00)
GFR, Estimated: >60 mL/min (ref >60)
Glucose, Bld: 109 mg/dL — ABNORMAL HIGH (ref 70–99)
Potassium: 3.8 mmol/L (ref 3.5–5.1)
Sodium: 140 mmol/L (ref 135–145)

## 2021-12-14 LAB — GLUCOSE, CAPILLARY
Glucose-Capillary: 112 mg/dL — ABNORMAL HIGH (ref 70–99)
Glucose-Capillary: 129 mg/dL — ABNORMAL HIGH (ref 70–99)
Glucose-Capillary: 162 mg/dL — ABNORMAL HIGH (ref 70–99)

## 2021-12-14 LAB — MAGNESIUM: Magnesium: 2.1 mg/dL (ref 1.7–2.4)

## 2021-12-14 LAB — PROCALCITONIN: Procalcitonin: <0.1 ng/mL

## 2021-12-14 LAB — PHOSPHORUS: Phosphorus: 3 mg/dL (ref 2.5–4.6)

## 2021-12-14 NOTE — Telephone Encounter (Signed)
Upon getting report for Pt. Pt walking in the hall stated she was calling for half an hour during shift change. And would like a cup of ice gave the Pt a cup of ice and she stated she did not want any care for this evening. Respiratory and tech went in to give care Pt refused. Charge Nurse informed

## 2021-12-14 NOTE — Telephone Encounter (Signed)
Pt is on Telemetry leads are off. Pt refused putting leads back on. Telemetry tech informed.

## 2021-12-14 NOTE — Progress Notes (Signed)
Orthopedic Tech Progress Note Patient Details:  Rachel Nolan 04/02/1970 [id number]  Ortho Devices Type of Ortho Device: Lumbar corsett Ortho Device/Splint Interventions: Ordered, Application   Post Interventions Patient Tolerated: Well  Domanic Matusek A [name] 12/14/2021, 11:58 AM
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
## 2021-12-14 NOTE — Progress Notes (Signed)
PROGRESS NOTE  Rachel Nolan  DOB: 07-14-1970  PCP: Patient, No Pcp Per [id number]  DOA: 12/12/2021  LOS: 2 days  Hospital Day: 3  Brief narrative: Rachel Nolan is a 50 y.o. female with PMH significant for COPD on 2 L oxygen, HTN, anxiety/depression, scoliosis, polysubstance abuse (crack cocaine, marijuana, tobacco). 11/25, patient presented to the ED with shortness of breath and wheezing.    EMS gave Solu-Medrol IV, DuoNeb, IV magnesium without much improvement. In the ED, she required 2 5 L of oxygen to maintain saturation on ambulation. Labs with WBC count 15.3, lactate level normal VBG without evidence of acidosis or hypercapnia Respiratory virus panel negative for COVID, flu Chest x-ray showed right basilar pneumonia Patient was started on IV Rocephin, azithromycin, fluid bolus Admitted to [facility]  Subjective: Patient was seen and examined this afternoon.  Middle-aged African-American female.  Lying down in bed.  Feels better than presentation. Chart reviewed No fever, hemodynamically stable, currently on 2 L oxygen via nasal cannula. On ambulation with PT this morning, her saturation dropped to 88% on 3 L.  Heart rate jumped up to to 136 bpm.  Assessment and plan: Sepsis secondary to pneumonia  Presented with shortness of breath, wheezing WBC count elevated to 15.3 Chest x-ray showed right basilar pneumonia. Patient was started on IV Rocephin, azithromycin. WBC count trending down.  Continue antibiotics today. Recent Labs  Lab 12/12/21 0211 12/12/21 0325 12/12/21 0630 12/13/21 0230 12/14/21 0258  WBC 15.3*  --  11.9* 14.4* 11.0*  LATICACIDVEN  --  1.2  --   --   --   PROCALCITON  --   --   --  <0.10 <0.10   Acute exacerbation of COPD  current everyday smoker On steroid taper, bronchodilators, Mucinex, Singulair.  Currently on prednisone 60 mg daily.  Acute on chronic hypoxic respiratory failure  On 2 L oxygen at home at baseline.  Required up to 5 L at  presentation.  On ambulation today was tachypneic and tachycardic on 3 L.  Continue to wean down as tolerated. Obtain echocardiogram to rule out CHF  Essential hypertension Currently normotensive without meds. Awaiting beta-blocker due to ongoing cocaine abuse.  Type 2 diabetes mellitus A1c 5.6 in March 2023 PTA not on meds Currently on sliding scale insulin with Accu-Cheks.  Patient wants regular diet Recent Labs  Lab 12/13/21 1126 12/13/21 1735 12/13/21 2101 12/14/21 0823 12/14/21 1151  GLUCAP 207* 124* 117* 112* 129*   Hypokalemia/hypophosphatemia Improved with replacement. Recent Labs  Lab 12/12/21 0223 12/12/21 0248 12/12/21 0325 12/12/21 0630 12/13/21 0230 12/14/21 0258  K >8.5* 3.4* 3.4*  --  3.8 3.8  MG  --   --   --  2.2  --  2.1  PHOS  --   --   --   --  2.4* 3.0   Vaginal bleeding Patient had menopause 15 years ago apparently.  She states since the Thanksgiving day 4 days ago, she started having vaginal bleeding.  It stopped this morning.   Hemoglobin stable over 11. Obtain transabdominal ultrasound. Recent Labs    12/12/21 0223 12/12/21 0248 12/12/21 0630 12/13/21 0230 12/14/21 0258  HGB 14.3 13.6 11.9* 10.9* 11.1*  MCV  --   --  94.0 93.2 92.7    Anxiety /depression Continue home medication Prozac, Seroquel, Minipress, as needed hydroxyzine She is also taking Neurontin 800 mg 3 times a day, continued  Obesity  -Body mass index is 33.09 kg/m. Patient has been advised to make an attempt to  improve diet and exercise patterns to aid in weight loss.  Generalized weakness Walks with a cane.  PT eval obtained.   Ongoing polysubstance abuse (crack cocaine, tobacco) Counseled to quit Nicotine patch offered  Homelessness  she reports she is homeless, currently lives at her sister's home social worker consulted  Scoliosis  Patient has scoliosis and was using back brace which she has lost for a long time.  PT eval obtained.  Corset brace started  for symptom relief.  Goals of care   Code Status: Full Code    Mobility: Encourage ambulation  Infusions:   azithromycin 500 mg (12/14/21 0322)   cefTRIAXone (ROCEPHIN)  IV 2 g (12/14/21 0248)    Scheduled Meds:  budesonide (PULMICORT) nebulizer solution  0.25 mg Nebulization BID   enoxaparin (LOVENOX) injection  40 mg Subcutaneous Q24H   FLUoxetine  40 mg Oral q morning   gabapentin  800 mg Oral TID   guaiFENesin  600 mg Oral BID   insulin aspart  0-15 Units Subcutaneous TID WC   ipratropium  0.5 mg Nebulization Q6H   levalbuterol  0.63 mg Nebulization Q6H   montelukast  10 mg Oral QHS   nicotine  14 mg Transdermal Daily   predniSONE  60 mg Oral Q breakfast   QUEtiapine  100 mg Oral QHS    PRN meds: acetaminophen **OR** acetaminophen, hydrOXYzine   Skin assessment:     Nutritional status:  Body mass index is 33.09 kg/m.          Diet:  Diet Order             Diet regular Room service appropriate? Yes; Fluid consistency: Thin  Diet effective now                   DVT prophylaxis:  enoxaparin (LOVENOX) injection 40 mg Start: 12/12/21 1000   Antimicrobials: IV Rocephin, azithromycin Fluid: None Consultants: None Family Communication: None at bedside  Status is: Inpatient  Continue in-hospital care because: Has significant hypoxia, tachypnea on ambulation. Level of care: Telemetry Medical   Dispo: The patient is from: Sisters house              Anticipated d/c is to: Back to her sister's house tomorrow              Patient currently is not medically stable to d/c.   Difficult to place patient No       Antimicrobials: Anti-infectives (From admission, onward)    Start     Dose/Rate Route Frequency Ordered Stop   12/12/21 0315  cefTRIAXone (ROCEPHIN) 2 g in sodium chloride 0.9 % 100 mL IVPB        2 g 200 mL/hr over 30 Minutes Intravenous Every 24 hours 12/12/21 0306 12/17/21 0314   12/12/21 0315  azithromycin (ZITHROMAX) 500 mg in  sodium chloride 0.9 % 250 mL IVPB        500 mg 250 mL/hr over 60 Minutes Intravenous Every 24 hours 12/12/21 0306 12/17/21 0314       Objective: Vitals:   12/14/21 0847 12/14/21 1623  BP:  (!) 136/99  Pulse: 76 71  Resp: 18 16  Temp:  97.7 F (36.5 C)  SpO2: 99% 98%    Intake/Output Summary (Last 24 hours) at 12/14/2021 1635 Last data filed at 12/14/2021 1400 Gross per 24 hour  Intake 480 ml  Output --  Net 480 ml   Filed Weights   12/12/21 0216  Weight: 93 kg  Weight change:  Body mass index is 33.09 kg/m.   Physical Exam: General exam: Middle-aged African-American female. Skin: No rashes, lesions or ulcers. HEENT: Atraumatic, normocephalic, no obvious bleeding Lungs: Diminished air entry in both bases.  Otherwise clear to auscultation.  Coughs on deep breathing CVS: Regular rate and rhythm, no murmur GI/Abd soft, nontender, nondistended, bowel sound present CNS: Alert, awake, oriented x 3 Psychiatry: Mood appropriate Extremities: No pedal edema, no calf tenderness  Data Review: I have personally reviewed the laboratory data and studies available.  F/u labs ordered Unresulted Labs (From admission, onward)     Start     Ordered   12/14/21 0500  Hemoglobin A1c  Tomorrow morning,   R       Comments: To assess prior glycemic control   Question:  Specimen collection method  Answer:  Lab=Lab collect   12/13/21 1633   12/12/21 1751  Legionella Pneumophila Serogp 1 Ur Ag  Once,   R        12/12/21 1750            Signed, Lorin Glass, MD [facility] 12/14/2021

## 2021-12-14 NOTE — TOC Initial Note (Addendum)
Transition of Care (TOC) - Initial/Assessment Note   Spoke to patient via phone. Patient currently staying with her sister and can return to sister's home at discharge.   Patient has oxygen already through [employer]. She is requesting a smaller portable tank that she can carry "over my back". NCM called Belenda Cruise with [employer]. She will need an order for portable oxygen tank evaluation / POC evaluation. NCM entered order and secure chatted MD to sign   Patient needs PCP , agreeable to a [facility].  Scheduled follow up appointment with Dr Albertine Grates December 21, 2021 at 0845 am. Information placed on AVS  Patient Details  Name: Rachel Nolan MRN: [id number] Date of Birth: 1970-06-02  Transition [facility]) CM/SW Contact:    Kingsley Plan, RN Phone Number: 12/14/2021, 1:01 PM  Clinical Narrative:                   Expected Discharge Plan: Home/Self Care Barriers to Discharge: Continued Medical Work up   Patient Goals and CMS Choice Patient states their goals for this hospitalization and ongoing recovery are:: to return to her sister's CMS Medicare.gov Compare Post Acute Care list provided to:: Patient Choice offered to / list presented to : Patient  Expected Discharge Plan and Services Expected Discharge Plan: Home/Self Care In-house Referral: Financial Counselor Discharge Planning Services: CM Consult Post Acute Care Choice: Durable Medical Equipment Living arrangements for the past 2 months: Single Family Home                 DME Arranged:  (see note)         HH Arranged: NA          Prior Living Arrangements/Services Living arrangements for the past 2 months: Single Family Home Lives with:: Siblings Patient language and need for interpreter reviewed:: Yes Do you feel safe going back to the place where you live?: Yes      Need for Family Participation in Patient Care: Yes (Comment) Care giver support system in place?: Yes (comment) Current home  services: DME Criminal Activity/Legal Involvement Pertinent to Current Situation/Hospitalization: No - Comment as needed  Activities of Daily Living Home Assistive Devices/Equipment: CPAP, Oxygen, Walker (specify type) ADL Screening (condition at time of admission) Patient's cognitive ability adequate to safely complete daily activities?: Yes Is the patient deaf or have difficulty hearing?: No Does the patient have difficulty seeing, even when wearing glasses/contacts?: No Does the patient have difficulty concentrating, remembering, or making decisions?: No Patient able to express need for assistance with ADLs?: Yes Does the patient have difficulty dressing or bathing?: No Independently performs ADLs?: No Communication: Independent Dressing (OT): Needs assistance Is this a change from baseline?: Pre-admission baseline Grooming: Independent Feeding: Independent Bathing: Needs assistance Is this a change from baseline?: Pre-admission baseline Toileting: Needs assistance Is this a change from baseline?: Pre-admission baseline In/Out Bed: Independent Walks in Home: Independent with device (comment) Does the patient have difficulty walking or climbing stairs?: Yes Weakness of Legs: Both Weakness of Arms/Hands: None  Permission Sought/Granted   Permission granted to share information with : No              Emotional Assessment   Attitude/Demeanor/Rapport: Engaged Affect (typically observed): Accepting Orientation: : Oriented to Self, Oriented to Place, Oriented to  Time, Oriented to Situation Alcohol / Substance Use: Not Applicable Psych Involvement: No (comment)  Admission diagnosis:  Respiratory distress [R06.03] COPD exacerbation (HCC) [J44.1] COPD with acute exacerbation (HCC) [J44.1] Pneumonia  of right lower lobe due to infectious organism [J18.9] Patient Active Problem List   Diagnosis Date Noted   Acute on chronic respiratory failure with hypoxemia (HCC) 12/12/2021    Sepsis (HCC) 12/12/2021   CAP (community acquired pneumonia) 12/12/2021   Hypokalemia 12/12/2021   Hyperglycemia 12/12/2021   COPD with acute exacerbation (HCC) 09/20/2021   Polysubstance use disorder    Asthma exacerbation 05/17/2021   OSA (obstructive sleep apnea) 05/17/2021   Marijuana user 05/17/2021   Chronic respiratory failure with hypoxia (HCC) - 2 L/min 04/20/2021   History of substance abuse (HCC) 04/20/2021   Nicotine dependence, cigarettes, uncomplicated 02/12/2021   Prolonged Q-T interval on ECG 09/26/2020   Mild asthma exacerbation 09/24/2020   Tobacco abuse 09/24/2020   Abnormal weight loss 09/24/2020   Asthma 09/23/2020   Homeless 08/22/2020   Cocaine abuse, continuous (HCC) 07/09/2020   Opioid use disorder, moderate, dependence (HCC) 05/16/2020   Adjustment disorder with depressed mood 04/24/2020   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   Essential hypertension 03/26/2020   Neuropathic pain 03/26/2020   Severe recurrent major depression with psychotic features (HCC) 03/25/2020   Major depressive disorder with psychotic features (HCC) 01/24/2020   Stimulant use disorder 01/24/2020   Alcohol use disorder, severe, dependence (HCC) 12/06/2019   Cocaine dependence (HCC) 03/05/2019   Major depressive disorder, recurrent episode, severe (HCC) 03/02/2019   Post traumatic stress disorder (PTSD) 03/02/2019   Anxiety and depression 03/02/2019   Substance induced mood disorder (HCC) 03/02/2019   MDD (major depressive disorder) 03/01/2019   PCP:  Patient, No Pcp Per Pharmacy:  No Pharmacies Listed    Social Determinants of Health (SDOH) Interventions    Readmission Risk Interventions    09/22/2021    5:06 PM 05/19/2021   10:10 AM 05/13/2021   11:33 AM  Readmission Risk Prevention Plan  Transportation Screening  Complete Complete  Medication Review (RN Care Manager)  Complete Complete  PCP or Specialist appointment within 3-5 days of discharge   Not  Complete  PCP/Specialist Appt Not Complete comments  first apt is 5/24 apt wll be made for [facility] new pcp apt  HRI or Home Care Consult  Complete Complete  SW Recovery Care/Counseling Consult  Complete Complete  Palliative Care Screening  Not Applicable Not Applicable  Skilled Nursing Facility  Not Applicable Not Applicable     Information is confidential and restricted. Go to Review Flowsheets to unlock data.

## 2021-12-15 ENCOUNTER — Other Ambulatory Visit (HOSPITAL_COMMUNITY): Payer: Self-pay

## 2021-12-15 ENCOUNTER — Inpatient Hospital Stay (HOSPITAL_COMMUNITY): Payer: Commercial Managed Care - HMO

## 2021-12-15 DIAGNOSIS — R9389 Abnormal findings on diagnostic imaging of other specified body structures: Secondary | ICD-10-CM | POA: Diagnosis present

## 2021-12-15 DIAGNOSIS — J441 Chronic obstructive pulmonary disease with (acute) exacerbation: Secondary | ICD-10-CM | POA: Diagnosis not present

## 2021-12-15 LAB — LEGIONELLA PNEUMOPHILA SEROGP 1 UR AG: L. pneumophila Serogp 1 Ur Ag: NEGATIVE

## 2021-12-15 LAB — EXPECTORATED SPUTUM ASSESSMENT W GRAM STAIN, RFLX TO RESP C

## 2021-12-15 LAB — HEMOGLOBIN A1C
Hgb A1c MFr Bld: 5.8 % — ABNORMAL HIGH (ref 4.8–5.6)
Mean Plasma Glucose: 120 mg/dL

## 2021-12-15 LAB — GLUCOSE, CAPILLARY
Glucose-Capillary: 104 mg/dL — ABNORMAL HIGH (ref 70–99)
Glucose-Capillary: 146 mg/dL — ABNORMAL HIGH (ref 70–99)

## 2021-12-15 MED ORDER — FLUTICASONE FUROATE-VILANTEROL 200-25 MCG/ACT IN AEPB
2.0000 | INHALATION_SPRAY | Freq: Two times a day (BID) | RESPIRATORY_TRACT | 2 refills | Status: DC
  Filled 2021-12-15: qty 60, 30d supply, fill #0

## 2021-12-15 MED ORDER — MONTELUKAST SODIUM 10 MG PO TABS
10.0000 mg | ORAL_TABLET | Freq: Every day | ORAL | 1 refills | Status: DC
  Filled 2021-12-15: qty 30, 30d supply, fill #0

## 2021-12-15 MED ORDER — IPRATROPIUM BROMIDE 0.02 % IN SOLN
0.5000 mg | Freq: Three times a day (TID) | RESPIRATORY_TRACT | Status: DC
  Administered 2021-12-15: 0.5 mg via RESPIRATORY_TRACT
  Filled 2021-12-15: qty 2.5

## 2021-12-15 MED ORDER — CEFDINIR 300 MG PO CAPS
300.0000 mg | ORAL_CAPSULE | Freq: Two times a day (BID) | ORAL | 0 refills | Status: AC
  Filled 2021-12-15: qty 6, 3d supply, fill #0

## 2021-12-15 MED ORDER — GUAIFENESIN ER 600 MG PO TB12
600.0000 mg | ORAL_TABLET | Freq: Two times a day (BID) | ORAL | 0 refills | Status: AC
  Filled 2021-12-15: qty 40, 20d supply, fill #0

## 2021-12-15 MED ORDER — CEFDINIR 300 MG PO CAPS
300.0000 mg | ORAL_CAPSULE | Freq: Two times a day (BID) | ORAL | Status: DC
  Administered 2021-12-15: 300 mg via ORAL
  Filled 2021-12-15 (×2): qty 1

## 2021-12-15 MED ORDER — PREDNISONE 10 MG PO TABS
ORAL_TABLET | ORAL | 0 refills | Status: DC
  Filled 2021-12-15: qty 19, 7d supply, fill #0

## 2021-12-15 MED ORDER — IPRATROPIUM-ALBUTEROL 0.5-2.5 (3) MG/3ML IN SOLN
3.0000 mL | Freq: Four times a day (QID) | RESPIRATORY_TRACT | 0 refills | Status: DC | PRN
  Filled 2021-12-15: qty 360, 30d supply, fill #0

## 2021-12-15 MED ORDER — LEVALBUTEROL HCL 0.63 MG/3ML IN NEBU
0.6300 mg | INHALATION_SOLUTION | Freq: Three times a day (TID) | RESPIRATORY_TRACT | Status: DC
  Administered 2021-12-15: 0.63 mg via RESPIRATORY_TRACT
  Filled 2021-12-15 (×2): qty 3

## 2021-12-15 MED ORDER — ALBUTEROL SULFATE HFA 108 (90 BASE) MCG/ACT IN AERS
2.0000 | INHALATION_SPRAY | RESPIRATORY_TRACT | 1 refills | Status: DC | PRN
  Filled 2021-12-15: qty 6.7, 16d supply, fill #0

## 2021-12-15 NOTE — Discharge Summary (Addendum)
Physician Discharge Summary  Rachel Nolan RFF:[id number] DOB: Jan 01, 1971 DOA: 12/12/2021  PCP: Patient, No Pcp Per  Admit date: 12/12/2021 Discharge date: 12/15/2021  Admitted From: Home Discharge disposition: Home  Recommendations at discharge:  Complete the course of antibiotics with 3 more days of oral Omnicef with probiotics. Stop smoking.  Stop cocaine. Continue Mucinex, bronchodilators, Singulair Follow-up with GYN as an outpatient for endometrial sampling.  Outpatient referral ordered.   Brief narrative: Rachel Nolan is a 51 y.o. female with PMH significant for COPD on 2 L oxygen, HTN, anxiety/depression, scoliosis, polysubstance abuse (crack cocaine, marijuana, tobacco). 11/25, patient presented to the ED with shortness of breath and wheezing.    EMS gave Solu-Medrol IV, DuoNeb, IV magnesium without much improvement. In the ED, she required 2 5 L of oxygen to maintain saturation on ambulation. Labs with WBC count 15.3, lactate level normal VBG without evidence of acidosis or hypercapnia Respiratory virus panel negative for COVID, flu Chest x-ray showed right basilar pneumonia Patient was started on IV Rocephin, azithromycin, fluid bolus Admitted to [facility]  Subjective: Patient was seen and examined this afternoon.  Sitting up in bed.  Not in distress.  Middle-aged African-American female.  Lying down in bed.  Feels better than presentation. Feels ready for discharge.  Hospital course: Sepsis secondary to pneumonia  Presented with shortness of breath, wheezing WBC count elevated to 15.3 Chest x-ray showed right basilar pneumonia. Patient was started on IV Rocephin, azithromycin.  Switch to oral Omnicef this morning.  Discharge on 3 more days of oral Omnicef. Recent Labs  Lab 12/12/21 0211 12/12/21 0325 12/12/21 0630 12/13/21 0230 12/14/21 0258  WBC 15.3*  --  11.9* 14.4* 11.0*  LATICACIDVEN  --  1.2  --   --   --   PROCALCITON  --   --   --  <0.10 <0.10    Acute exacerbation of COPD  current everyday smoker On steroid, bronchodilators, Mucinex, Singulair.  Currently on prednisone 60 mg daily.  Discharge on a tapering course Continue bronchodilators, Mucinex  Acute on chronic hypoxic respiratory failure  On 2 L oxygen at home at baseline.  Required up to 5 L at presentation.  Gradually improving oxygen requirement.  Discharge on 3 L oxygen by nasal cannula.  At patient's request, home health services and more portable oxygen delivery equipment have been arranged by case manager . Essential hypertension Currently normotensive without meds. Avoiding beta-blocker due to ongoing cocaine abuse.  Type 2 diabetes mellitus A1c 5.6 in March 2023 PTA not on meds.  Blood sugar level currently running slightly elevated because of steroids. Recent Labs  Lab 12/14/21 0823 12/14/21 1151 12/14/21 1726 12/15/21 0853 12/15/21 1226  GLUCAP 112* 129* 162* 104* 146*   Hypokalemia/hypophosphatemia Improved with replacement. Recent Labs  Lab 12/12/21 0223 12/12/21 0248 12/12/21 0325 12/12/21 0630 12/13/21 0230 12/14/21 0258  K >8.5* 3.4* 3.4*  --  3.8 3.8  MG  --   --   --  2.2  --  2.1  PHOS  --   --   --   --  2.4* 3.0   Vaginal bleeding Patient had menopause 15 years ago apparently.  She states since the Thanksgiving day 4 days ago, she started having vaginal bleeding.  It stopped this morning.   Hemoglobin stable over 11. 11/27, pelvic ultrasound showed abnormally thickened endometrium for a postmenopausal female, measuring 9.3 mm.  I have discussed this with the patient.  She needs to follow-up with GYN as an outpatient.  Outpatient referral given. She may need endometrial sampling Recent Labs    12/12/21 0223 12/12/21 0248 12/12/21 0630 12/13/21 0230 12/14/21 0258  HGB 14.3 13.6 11.9* 10.9* 11.1*  MCV  --   --  94.0 93.2 92.7   Anxiety /depression Continue home medication as before.  Obesity  -Body mass index is 33.09 kg/m.  Patient has been advised to make an attempt to improve diet and exercise patterns to aid in weight loss.  Generalized weakness Walks with a cane.     Ongoing polysubstance abuse (crack cocaine, tobacco) Counseled to quit Nicotine patch offered  Homelessness  she reports she is homeless, currently lives at her sister's home  Scoliosis  Patient has scoliosis and was using back brace which she has lost for a long time.  PT eval obtained.  Corset brace started for symptom relief.  Wounds:  -    Discharge Exam:   Vitals:   12/14/21 1623 12/15/21 0835 12/15/21 0902 12/15/21 1422  BP: (!) 136/99  (!) 190/114   Pulse: 71  79   Resp: 16  17   Temp: 97.7 F (36.5 C)  98.5 F (36.9 C)   TempSrc: Oral  Oral   SpO2: 98% 94% 95% 93%  Weight:      Height:        Body mass index is 33.09 kg/m.   General exam: Middle-aged African-American female. Skin: No rashes, lesions or ulcers. HEENT: Atraumatic, normocephalic, no obvious bleeding Lungs: Mild scattered wheezing.  Better than before.  Coughs on deep breathing CVS: Regular rate and rhythm, no murmur GI/Abd soft, nontender, nondistended, bowel sound present CNS: Alert, awake, oriented x 3 Psychiatry: Mood appropriate Extremities: No pedal edema, no calf tenderness  Follow ups:    Follow-up Information     Rachel Crocker, MD Follow up.   Why: Monday December 21, 2021 at 0845 am Contact information: [address] [place] [address] [phone]                 Discharge Instructions:   Discharge Instructions     Ambulatory referral to Obstetrics / Gynecology   Complete by: As directed    Abnormally thickened endometrium.  Sampling may be needed   Call MD for:  difficulty breathing, headache or visual disturbances   Complete by: As directed    Call MD for:  extreme fatigue   Complete by: As directed    Call MD for:  hives   Complete by: As directed    Call MD for:  persistant dizziness or  light-headedness   Complete by: As directed    Call MD for:  persistant nausea and vomiting   Complete by: As directed    Call MD for:  severe uncontrolled pain   Complete by: As directed    Call MD for:  temperature >100.4   Complete by: As directed    Diet general   Complete by: As directed    Discharge instructions   Complete by: As directed    Recommendations at discharge:   Complete the course of antibiotics with 3 more days of oral Omnicef with probiotics.  Stop smoking.  Stop cocaine.  Continue Mucinex, bronchodilators, Singulair  Follow-up with GYN as an outpatient for endometrial sampling.  Outpatient referral ordered.  General discharge instructions: Follow with Primary MD Patient, No Pcp Per in 7 days  Please request your PCP  to go over your hospital tests, procedures, radiology results at the follow up. Please get your  medicines reviewed and adjusted.  Your PCP may decide to repeat certain labs or tests as needed. Do not drive, operate heavy machinery, perform activities at heights, swimming or participation in water activities or provide baby sitting services if your were admitted for syncope or siezures until you have seen by Primary MD or a Neurologist and advised to do so again. [place] Controlled Substance Reporting System database was reviewed. Do not drive, operate heavy machinery, perform activities at heights, swim, participate in water activities or provide baby-sitting services while on medications for pain, sleep and mood until your outpatient physician has reevaluated you and advised to do so again.  You are strongly recommended to comply with the dose, frequency and duration of prescribed medications. Activity: As tolerated with Full fall precautions use walker/cane & assistance as needed Avoid using any recreational substances like cigarette, tobacco, alcohol, or non-prescribed drug. If you experience worsening of your admission symptoms, develop  shortness of breath, life threatening emergency, suicidal or homicidal thoughts you must seek medical attention immediately by calling 911 or calling your MD immediately  if symptoms less severe. You must read complete instructions/literature along with all the possible adverse reactions/side effects for all the medicines you take and that have been prescribed to you. Take any new medicine only after you have completely understood and accepted all the possible adverse reactions/side effects.  Wear Seat belts while driving. You were cared for by a hospitalist during your hospital stay. If you have any questions about your discharge medications or the care you received while you were in the hospital after you are discharged, you can call the unit and ask to speak with the hospitalist or the covering physician. Once you are discharged, your primary care physician will handle any further medical issues. Please note that NO REFILLS for any discharge medications will be authorized once you are discharged, as it is imperative that you return to your primary care physician (or establish a relationship with a primary care physician if you do not have one).   Increase activity slowly   Complete by: As directed        Discharge Medications:   Allergies as of 12/15/2021   No Known Allergies      Medication List     STOP taking these medications    amLODipine 5 MG tablet Commonly known as: NORVASC   Dulera 200-5 MCG/ACT Aero Generic drug: mometasone-formoterol Replaced by: fluticasone furoate-vilanterol 200-25 MCG/ACT Aepb   FLUoxetine 40 MG capsule Commonly known as: PROZAC   gabapentin 400 MG capsule Commonly known as: NEURONTIN   hydrOXYzine 25 MG tablet Commonly known as: ATARAX   nicotine 21 mg/24hr patch Commonly known as: NICODERM CQ - dosed in mg/24 hours   prazosin 1 MG capsule Commonly known as: MINIPRESS   QUEtiapine 100 MG tablet Commonly known as: SEROQUEL   traZODone  150 MG tablet Commonly known as: DESYREL       TAKE these medications    albuterol 108 (90 Base) MCG/ACT inhaler Commonly known as: VENTOLIN HFA Inhale 2 puffs into the lungs every 4 (four) hours as needed for shortness of breath or wheezing. What changed: Another medication with the same name was removed. Continue taking this medication, and follow the directions you see here.   cefdinir 300 MG capsule Commonly known as: OMNICEF Take 1 capsule (300 mg total) by mouth every 12 (twelve) hours for 3 days.   fluticasone furoate-vilanterol 200-25 MCG/ACT Aepb Commonly known as: BREO ELLIPTA Inhale 2  puffs into the lungs 2 (two) times daily. Replaces: Dulera 200-5 MCG/ACT Aero   guaiFENesin 600 MG 12 hr tablet Commonly known as: MUCINEX Take 1 tablet (600 mg total) by mouth 2 (two) times daily.   ipratropium-albuterol 0.5-2.5 (3) MG/3ML Soln Commonly known as: DUONEB Take 3 mLs by nebulization every 6 (six) hours as needed (Use FIRST).   montelukast 10 MG tablet Commonly known as: SINGULAIR Take 1 tablet (10 mg total) by mouth at bedtime.   predniSONE 10 MG tablet Commonly known as: DELTASONE Take 4 tablets daily X 2 days, then, Take 3 tablets daily X 2 days, then, Take 2 tablets daily X 2 days, then, Take 1 tablets daily X 1 day.               Durable Medical Equipment  (From admission, onward)           Start     Ordered   12/14/21 1257  For home use only DME Other see comment  Once       Comments: Portable oxygen tank evaluation   POC evaluation  Question:  Length of Need  Answer:  Lifetime   12/14/21 1257             The results of significant diagnostics from this hospitalization (including imaging, microbiology, ancillary and laboratory) are listed below for reference.    Procedures and Diagnostic Studies:   DG Chest Port 1 View  Result Date: 12/12/2021 CLINICAL DATA:  Respiratory distress EXAM: PORTABLE CHEST 1 VIEW COMPARISON:  09/23/2021  FINDINGS: Cardiac shadow is stable. Lungs are well aerated bilaterally. Patchy airspace opacity is noted in the right base consistent with acute pneumonia. No bony abnormality is seen. IMPRESSION: Right basilar pneumonia. Electronically Signed   By: Alcide CleverMark  Lukens M.D.   On: 12/12/2021 02:21     Labs:   Basic Metabolic Panel: Recent Labs  Lab 12/12/21 0223 12/12/21 0248 12/12/21 0325 12/12/21 0630 12/13/21 0230 12/14/21 0258  NA 126* 138 138  --  143 140  K >8.5* 3.4* 3.4*  --  3.8 3.8  CL  --  100 99  --  107 102  CO2  --   --  27  --  28 27  GLUCOSE  --  163* 179*  --  121* 109*  BUN  --  13 13  --  11 17  CREATININE  --  0.60 0.80  --  0.74 0.69  CALCIUM  --   --  9.3  --  8.8* 8.6*  MG  --   --   --  2.2  --  2.1  PHOS  --   --   --   --  2.4* 3.0   GFR Estimated Creatinine Clearance: 93.5 mL/min (by C-G formula based on SCr of 0.69 mg/dL). Liver Function Tests: Recent Labs  Lab 12/12/21 0325  AST 14*  ALT 13  ALKPHOS 58  BILITOT 0.4  PROT 6.7  ALBUMIN 3.3*   No results for input(s): "LIPASE", "AMYLASE" in the last 168 hours. No results for input(s): "AMMONIA" in the last 168 hours. Coagulation profile No results for input(s): "INR", "PROTIME" in the last 168 hours.  CBC: Recent Labs  Lab 12/12/21 0211 12/12/21 0223 12/12/21 0248 12/12/21 0630 12/13/21 0230 12/14/21 0258  WBC 15.3*  --   --  11.9* 14.4* 11.0*  NEUTROABS 10.3*  --   --   --  11.4* 7.4  HGB 13.6 14.3 13.6 11.9* 10.9* 11.1*  HCT  39.9 42.0 40.0 36.3 32.8* 33.0*  MCV 93.2  --   --  94.0 93.2 92.7  PLT 225  --   --  207 214 219   Cardiac Enzymes: No results for input(s): "CKTOTAL", "CKMB", "CKMBINDEX", "TROPONINI" in the last 168 hours. BNP: Invalid input(s): "POCBNP" CBG: Recent Labs  Lab 12/14/21 0823 12/14/21 1151 12/14/21 1726 12/15/21 0853 12/15/21 1226  GLUCAP 112* 129* 162* 104* 146*   D-Dimer No results for input(s): "DDIMER" in the last 72 hours. Hgb A1c Recent Labs     12/14/21 0258  HGBA1C 5.8*   Lipid Profile No results for input(s): "CHOL", "HDL", "LDLCALC", "TRIG", "CHOLHDL", "LDLDIRECT" in the last 72 hours. Thyroid function studies No results for input(s): "TSH", "T4TOTAL", "T3FREE", "THYROIDAB" in the last 72 hours.  Invalid input(s): "FREET3" Anemia work up No results for input(s): "VITAMINB12", "FOLATE", "FERRITIN", "TIBC", "IRON", "RETICCTPCT" in the last 72 hours. Microbiology Recent Results (from the past 240 hour(s))  Resp Panel by RT-PCR (Flu A&B, Covid) Anterior Nasal Swab     Status: None   Collection Time: 12/12/21  2:24 AM   Specimen: Anterior Nasal Swab  Result Value Ref Range Status   SARS Coronavirus 2 by RT PCR NEGATIVE NEGATIVE Final    Comment: (NOTE) SARS-CoV-2 target nucleic acids are NOT DETECTED.  The SARS-CoV-2 RNA is generally detectable in upper respiratory specimens during the acute phase of infection. The lowest concentration of SARS-CoV-2 viral copies this assay can detect is 138 copies/mL. A negative result does not preclude SARS-Cov-2 infection and should not be used as the sole basis for treatment or other patient management decisions. A negative result may occur with  improper specimen collection/handling, submission of specimen other than nasopharyngeal swab, presence of viral mutation(s) within the areas targeted by this assay, and inadequate number of viral copies(<138 copies/mL). A negative result must be combined with clinical observations, patient history, and epidemiological information. The expected result is Negative.  Fact Sheet for Patients:  BloggerCourse.com  Fact Sheet for Healthcare Providers:  SeriousBroker.it  This test is no t yet approved or cleared by the [place] FDA and  has been authorized for detection and/or diagnosis of SARS-CoV-2 by FDA under an Emergency Use Authorization (EUA). This EUA will remain  in effect (meaning  this test can be used) for the duration of the COVID-19 declaration under Section 564(b)(1) of the Act, 21 U.S.C.section 360bbb-3(b)(1), unless the authorization is terminated  or revoked sooner.       Influenza A by PCR NEGATIVE NEGATIVE Final   Influenza B by PCR NEGATIVE NEGATIVE Final    Comment: (NOTE) The Xpert Xpress SARS-CoV-2/FLU/RSV plus assay is intended as an aid in the diagnosis of influenza from Nasopharyngeal swab specimens and should not be used as a sole basis for treatment. Nasal washings and aspirates are unacceptable for Xpert Xpress SARS-CoV-2/FLU/RSV testing.  Fact Sheet for Patients: BloggerCourse.com  Fact Sheet for Healthcare Providers: SeriousBroker.it  This test is not yet approved or cleared by the [place] FDA and has been authorized for detection and/or diagnosis of SARS-CoV-2 by FDA under an Emergency Use Authorization (EUA). This EUA will remain in effect (meaning this test can be used) for the duration of the COVID-19 declaration under Section 564(b)(1) of the Act, 21 U.S.C. section 360bbb-3(b)(1), unless the authorization is terminated or revoked.  Performed at [facility], [address] [place], [place] [address]   Respiratory (~20 pathogens) panel by PCR     Status: Abnormal  Collection Time: 12/12/21  2:24 AM   Specimen: Nasopharyngeal Swab; Respiratory  Result Value Ref Range Status   Adenovirus NOT DETECTED NOT DETECTED Final   Coronavirus 229E NOT DETECTED NOT DETECTED Final    Comment: (NOTE) The Coronavirus on the Respiratory Panel, DOES NOT test for the novel  Coronavirus (2019 nCoV)    Coronavirus HKU1 NOT DETECTED NOT DETECTED Final   Coronavirus NL63 NOT DETECTED NOT DETECTED Final   Coronavirus OC43 NOT DETECTED NOT DETECTED Final   Metapneumovirus NOT DETECTED NOT DETECTED Final   Rhinovirus / Enterovirus DETECTED (A) NOT DETECTED Final   Influenza A NOT  DETECTED NOT DETECTED Final   Influenza B NOT DETECTED NOT DETECTED Final   Parainfluenza Virus 1 NOT DETECTED NOT DETECTED Final   Parainfluenza Virus 2 NOT DETECTED NOT DETECTED Final   Parainfluenza Virus 3 NOT DETECTED NOT DETECTED Final   Parainfluenza Virus 4 NOT DETECTED NOT DETECTED Final   Respiratory Syncytial Virus NOT DETECTED NOT DETECTED Final   Bordetella pertussis NOT DETECTED NOT DETECTED Final   Bordetella Parapertussis NOT DETECTED NOT DETECTED Final   Chlamydophila pneumoniae NOT DETECTED NOT DETECTED Final   Mycoplasma pneumoniae NOT DETECTED NOT DETECTED Final    Comment: Performed at [facility], [address] [place], [place] [address]  Blood culture (routine x 2)     Status: None (Preliminary result)   Collection Time: 12/12/21  3:25 AM   Specimen: BLOOD LEFT FOREARM  Result Value Ref Range Status   Specimen Description BLOOD LEFT FOREARM  Final   Special Requests   Final    BOTTLES DRAWN AEROBIC AND ANAEROBIC Blood Culture results may not be optimal due to an excessive volume of blood received in culture bottles   Culture   Final    NO GROWTH 3 DAYS Performed at [facility], [address], [place], [place] [address]    Report Status PENDING  Incomplete  Blood culture (routine x 2)     Status: None (Preliminary result)   Collection Time: 12/12/21  9:37 PM   Specimen: BLOOD RIGHT HAND  Result Value Ref Range Status   Specimen Description BLOOD RIGHT HAND  Final   Special Requests   Final    BOTTLES DRAWN AEROBIC AND ANAEROBIC Blood Culture adequate volume   Culture   Final    NO GROWTH 3 DAYS Performed at [facility], [address] [place], [place] [address]    Report Status PENDING  Incomplete  Expectorated Sputum Assessment w Gram Stain, Rflx to Resp Cult     Status: None (Preliminary result)   Collection Time: 12/13/21  9:20 PM   Specimen: Sputum  Result Value Ref Range Status   Specimen Description SPUTUM  Final   Special  Requests NONE  Final   Sputum evaluation   Final    THIS SPECIMEN IS ACCEPTABLE FOR SPUTUM CULTURE Performed at [facility], [address], [place], [place] [address]    Report Status PENDING  Incomplete  Culture, Respiratory w Gram Stain     Status: None (Preliminary result)   Collection Time: 12/13/21  9:20 PM   Specimen: SPU  Result Value Ref Range Status   Specimen Description SPUTUM  Final   Special Requests NONE Reflexed from [id number]  Final   Gram Stain   Final    FEW WBC PRESENT,BOTH PMN AND MONONUCLEAR FEW GRAM POSITIVE COCCI IN PAIRS FEW GRAM NEGATIVE RODS    Culture  Final    CULTURE REINCUBATED FOR BETTER GROWTH Performed at [facility], [address] [place], [place] [address]    Report Status PENDING  Incomplete    Time coordinating discharge: 35 minutes  Signed: Maurissa Ambrose  [facility] 12/15/2021, 3:19 PM

## 2021-12-16 LAB — CULTURE, RESPIRATORY W GRAM STAIN: Culture: NORMAL

## 2021-12-17 LAB — CULTURE, BLOOD (ROUTINE X 2)
Culture: NO GROWTH
Culture: NO GROWTH
Special Requests: ADEQUATE

## 2021-12-21 ENCOUNTER — Encounter: Payer: Self-pay | Admitting: Student

## 2021-12-21 ENCOUNTER — Ambulatory Visit (INDEPENDENT_AMBULATORY_CARE_PROVIDER_SITE_OTHER): Payer: Commercial Managed Care - HMO | Admitting: Student

## 2021-12-21 ENCOUNTER — Other Ambulatory Visit (HOSPITAL_COMMUNITY): Payer: Self-pay

## 2021-12-21 VITALS — BP 152/100 | HR 74 | Temp 98.3°F | Wt 194.8 lb

## 2021-12-21 DIAGNOSIS — Z720 Tobacco use: Secondary | ICD-10-CM

## 2021-12-21 DIAGNOSIS — I10 Essential (primary) hypertension: Secondary | ICD-10-CM | POA: Diagnosis not present

## 2021-12-21 DIAGNOSIS — F17210 Nicotine dependence, cigarettes, uncomplicated: Secondary | ICD-10-CM

## 2021-12-21 DIAGNOSIS — R9389 Abnormal findings on diagnostic imaging of other specified body structures: Secondary | ICD-10-CM | POA: Diagnosis not present

## 2021-12-21 DIAGNOSIS — J441 Chronic obstructive pulmonary disease with (acute) exacerbation: Secondary | ICD-10-CM | POA: Diagnosis not present

## 2021-12-21 DIAGNOSIS — Z139 Encounter for screening, unspecified: Secondary | ICD-10-CM | POA: Insufficient documentation

## 2021-12-21 NOTE — Assessment & Plan Note (Signed)
Patient with a history of vaginal bleeding prior to Thanksgiving.  Pelvic ultrasound during hospitalization showed an abnormally thickened endometrium for the postmenopausal female, measuring 9.3 mm.  CBC prior to discharge 14.3.  Patient has not been bleeding since.  Referral to Gyn as an outpatient was placed; patient is unable to pay for co-pay at this time. -Mention to patient to continue monitoring and return to clinic if bleeding returns -Will re-engage with GYN when patient's financial ability allows it

## 2021-12-21 NOTE — Patient Instructions (Addendum)
Thank you, Ms.[name] for allowing [place] to provide your care today. Today we discussed your breathing problems/COPD  Continue taking mucinex, using the albuterol inhaler as needed, using the Breo Ellipta inhaler twice a day Increase your breathing treatments to 3 times per day Once you finish the Breo Ellipta and the breathing treatments, please start using the Trelegy inhaler we gave you in clinic today.  That will be one puff per day.   You will also need blood pressure medication and some other refills. We will wait for our pharmacy coordinator to see if we can help you with medications. It would also be important you complete your applications for both Medicaid and disability.   My Chart Access: [email or web address]  Please follow-up in in 4 weeks.  Please make sure to arrive 15 minutes prior to your next appointment. If you arrive late, you may be asked to reschedule.    We look forward to seeing you next time. Please call our clinic at [phone] if you have any questions or concerns. The best time to call is Monday-Friday from 9am-4pm, but there is someone available 24/7. If after hours or the weekend, call the main hospital number and ask for the Internal Medicine Resident On-Call. If you need medication refills, please notify your pharmacy one week in advance and they will send [place] a request.   Thank you for letting us take part in your care. Wishing you the best!  Morene Crocker, MD 12/21/2021, 9:57 AM [name] Internal Medicine Resident, PGY-1

## 2021-12-21 NOTE — Assessment & Plan Note (Addendum)
Patient has decreased the number of cigarettes she has been smoking since Thanksgiving. Smoking since she was 51 yo, about 1/2 pack per day -Congratulated and counseled continued cessation -Will need low dose CT chest for lung cancer screening once patient has coverage
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
## 2021-12-21 NOTE — Assessment & Plan Note (Addendum)
History of multiple COPD exacerbations, last one in 11/2021 for which patient was hospitalized for sepsis 2/2 R basilar pneumonia. WBC count elevated to 15.3. Treated with rocephin, azithromycin, and prednisone. Patient was discharged with 3L O2 requirement, prednisone taper 60mg  and completion of omnicef course for 3 days. Patient is back to her 2L O2 requirement. Current meds: Ventolin HFAPRN,  Sigulair 10 mg, Breo-Ellipta (ICS, LABA), Ipratropium-albuterol duonebs twice daily. Coughing is non productive and has improved since discharge. Patient has also decreased the number of cigarettes used daily. Patient has had multiple exacerbations in the last year; she would benefit from ICS/LAMA/LABA therapy as opposed to doing duonebs only. Patient is currently limited as she is not able to pay for medications.  -Patient is to continue current regimen with Singulair, Breo Ellipta, DuoNebs scheduled TID, as needed albuterol -Switch to Trelegy once and DuoNebs run out -Will reach out to [employer] for medication assistance -Instructed patient to apply for Medicaid and disability coverage; once that is available we will also send patient for PFTs

## 2021-12-21 NOTE — Assessment & Plan Note (Signed)
Previously on Lisinopril and amlodipine 5mg . Was normotensive during hospitalization without meds, however patient was hypertensive during clinic.  Suspect coughing and walking from parking lot was contributory.  Patient has not been taking medications for the past few months as she was not able to afford them.  She is currently asymptomatic from the hypertension standpoint. -Will resume lisinopril and amlodipine as soon as patient can get medication assistance -BP log -Precautionary symptoms to be seen and evaluated by a provider were reviewed

## 2021-12-21 NOTE — Assessment & Plan Note (Signed)
Patient is currently living with sister as she is not able to pay for housing.  She was given a Trelegy sample from the clinic.  Will reach out to our pharmacy coordinator who inquired about possible medication assistance and was given Medicaid and disability paperwork for completion.

## 2021-12-23 NOTE — Progress Notes (Signed)
Internal Medicine Clinic Attending  Case discussed with Dr. Gomez-Caraballo  at the time of the visit.  We reviewed the resident's history and exam and pertinent patient test results.  I agree with the assessment, diagnosis, and plan of care documented in the resident's note.  

## 2022-01-05 ENCOUNTER — Other Ambulatory Visit: Payer: Self-pay

## 2022-01-22 ENCOUNTER — Other Ambulatory Visit: Payer: Self-pay

## 2022-01-22 ENCOUNTER — Emergency Department (HOSPITAL_COMMUNITY)
Admission: EM | Admit: 2022-01-22 | Discharge: 2022-01-22 | Disposition: A | Discharge status: Home / Self Care | Payer: Commercial Managed Care - HMO | Attending: Emergency Medicine | Admitting: Emergency Medicine

## 2022-01-22 ENCOUNTER — Encounter (HOSPITAL_COMMUNITY): Payer: Self-pay

## 2022-01-22 ENCOUNTER — Emergency Department (HOSPITAL_COMMUNITY): Payer: Commercial Managed Care - HMO

## 2022-01-22 DIAGNOSIS — Z79899 Other long term (current) drug therapy: Secondary | ICD-10-CM | POA: Insufficient documentation

## 2022-01-22 DIAGNOSIS — J45901 Unspecified asthma with (acute) exacerbation: Secondary | ICD-10-CM | POA: Insufficient documentation

## 2022-01-22 DIAGNOSIS — R0602 Shortness of breath: Secondary | ICD-10-CM | POA: Diagnosis present

## 2022-01-22 DIAGNOSIS — Z20822 Contact with and (suspected) exposure to covid-19: Secondary | ICD-10-CM | POA: Diagnosis not present

## 2022-01-22 DIAGNOSIS — I10 Essential (primary) hypertension: Secondary | ICD-10-CM | POA: Diagnosis not present

## 2022-01-22 DIAGNOSIS — Z7951 Long term (current) use of inhaled steroids: Secondary | ICD-10-CM | POA: Insufficient documentation

## 2022-01-22 DIAGNOSIS — J449 Chronic obstructive pulmonary disease, unspecified: Secondary | ICD-10-CM | POA: Insufficient documentation

## 2022-01-22 LAB — CBC WITH DIFFERENTIAL/PLATELET
Abs Immature Granulocytes: 0.01 10*3/uL (ref 0.00–0.07)
Basophils Absolute: 0 10*3/uL (ref 0.0–0.1)
Basophils Relative: 0 %
Eosinophils Absolute: 0.1 10*3/uL (ref 0.0–0.5)
Eosinophils Relative: 3 %
HCT: 36.1 % (ref 36.0–46.0)
Hemoglobin: 12.4 g/dL (ref 12.0–15.0)
Immature Granulocytes: 0 %
Lymphocytes Relative: 53 %
Lymphs Abs: 2.6 10*3/uL (ref 0.7–4.0)
MCH: 31.6 pg (ref 26.0–34.0)
MCHC: 34.3 g/dL (ref 30.0–36.0)
MCV: 91.9 fL (ref 80.0–100.0)
Monocytes Absolute: 0.7 10*3/uL (ref 0.1–1.0)
Monocytes Relative: 14 %
Neutro Abs: 1.5 10*3/uL — ABNORMAL LOW (ref 1.7–7.7)
Neutrophils Relative %: 30 %
Platelets: 245 10*3/uL (ref 150–400)
RBC: 3.93 MIL/uL (ref 3.87–5.11)
RDW: 14.6 % (ref 11.5–15.5)
WBC: 5 10*3/uL (ref 4.0–10.5)
nRBC: 0 % (ref 0.0–0.2)

## 2022-01-22 LAB — COMPREHENSIVE METABOLIC PANEL
ALT: 11 U/L (ref 0–44)
AST: 19 U/L (ref 15–41)
Albumin: 4 g/dL (ref 3.5–5.0)
Alkaline Phosphatase: 59 U/L (ref 38–126)
Anion gap: 12 (ref 5–15)
BUN: 13 mg/dL (ref 6–20)
CO2: 23 mmol/L (ref 22–32)
Calcium: 9 mg/dL (ref 8.9–10.3)
Chloride: 103 mmol/L (ref 98–111)
Creatinine, Ser: 0.67 mg/dL (ref 0.44–1.00)
GFR, Estimated: >60 mL/min (ref >60)
Glucose, Bld: 111 mg/dL — ABNORMAL HIGH (ref 70–99)
Potassium: 3.8 mmol/L (ref 3.5–5.1)
Sodium: 138 mmol/L (ref 135–145)
Total Bilirubin: 0.3 mg/dL (ref 0.3–1.2)
Total Protein: 7.1 g/dL (ref 6.5–8.1)

## 2022-01-22 LAB — RESP PANEL BY RT-PCR (RSV, FLU A&B, COVID)  RVPGX2
Influenza A by PCR: NEGATIVE
Influenza B by PCR: NEGATIVE
Resp Syncytial Virus by PCR: NEGATIVE
SARS Coronavirus 2 by RT PCR: NEGATIVE

## 2022-01-22 LAB — TROPONIN I (HIGH SENSITIVITY)
Troponin I (High Sensitivity): 4 ng/L (ref <18)
Troponin I (High Sensitivity): 4 ng/L (ref <18)

## 2022-01-22 MED ORDER — ALBUTEROL SULFATE (2.5 MG/3ML) 0.083% IN NEBU
10.0000 mg/h | INHALATION_SOLUTION | Freq: Once | RESPIRATORY_TRACT | Status: AC
  Administered 2022-01-22: 10 mg/h via RESPIRATORY_TRACT
  Filled 2022-01-22: qty 3

## 2022-01-22 MED ORDER — ALBUTEROL SULFATE HFA 108 (90 BASE) MCG/ACT IN AERS: 1.0000 | INHALATION_SPRAY | RESPIRATORY_TRACT | Status: DC | PRN

## 2022-01-22 MED ORDER — PREDNISONE 50 MG PO TABS
50.0000 mg | ORAL_TABLET | Freq: Every day | ORAL | 0 refills | Status: AC
  Filled 2022-01-22: qty 5, 5d supply, fill #0

## 2022-01-22 MED ORDER — MAGNESIUM SULFATE 2 GM/50ML IV SOLN
2.0000 g | Freq: Once | INTRAVENOUS | Status: AC
  Administered 2022-01-22: 2 g via INTRAVENOUS
  Filled 2022-01-22: qty 50

## 2022-01-22 MED ORDER — IPRATROPIUM-ALBUTEROL 0.5-2.5 (3) MG/3ML IN SOLN
3.0000 mL | Freq: Once | RESPIRATORY_TRACT | Status: AC
  Administered 2022-01-22: 3 mL via RESPIRATORY_TRACT
  Filled 2022-01-22: qty 3

## 2022-01-22 MED ORDER — PREDNISONE 20 MG PO TABS
60.0000 mg | ORAL_TABLET | Freq: Once | ORAL | Status: AC
  Administered 2022-01-22: 60 mg via ORAL
  Filled 2022-01-22: qty 3

## 2022-01-22 NOTE — ED Triage Notes (Signed)
Pt came in POV d/t SOB that started 30 min prior to arrival. She does have COPD, does still smoke & does wear 2L O2 at baseline. Rhonchi, chest tightness, & lightheadedness is associated with her nonexertional SOB (per pt). A/Ox4, 9/10 CP when she coughs.

## 2022-01-22 NOTE — ED Provider Triage Note (Signed)
Emergency Medicine Provider Triage Evaluation Note  Rachel Nolan , a 52 y.o. female  was evaluated in triage.  Pt complains of shortness of breath, chest pain.  Patient reports shortness of breath beginning approximately 30 minutes prior to arrival.  Patient with history of COPD on 2 L at baseline.  Patient reports compliance with her at home inhalers.  States that she has had shortness of breath with minimal exertion with noticed wheeze.  Reports chest pain with associated cough.  Denies fever, chills, night sweats.  Review of Systems  Positive: See above Negative:   Physical Exam  BP (!) 179/116 (BP Location: Right Arm)   Pulse 92   Resp (!) 22   LMP 04/19/2018   SpO2 96%  Gen:   Awake, no distress   Resp:  Normal effort  MSK:   Moves extremities without difficulty  Other:  Patient with diffuse wheeze/rhonchi auscultated in lung fields bilaterally.  Medical Decision Making  Medically screening exam initiated at 12:39 PM.  Appropriate orders placed.  Marg Macmaster was informed that the remainder of the evaluation will be completed by another provider, this initial triage assessment does not replace that evaluation, and the importance of remaining in the ED until their evaluation is complete.     Wilnette Kales, [place] 01/22/22 1242

## 2022-01-22 NOTE — ED Notes (Signed)
The patient did not answer for vitals 

## 2022-01-22 NOTE — ED Provider Notes (Signed)
[facility] EMERGENCY DEPARTMENT Provider Note   CSN: [id number] Arrival date & time: 01/22/22  1204     History  Chief Complaint  Patient presents with   SOB    Rachel Nolan is a 52 y.o. female. Past medical history of anxiety and depression, depression with psychotic features, hypertension, COPD, cocaine use disorder, alcohol use disorder, marijuana use disorder, neuropathic pain, asthma, prolonged QT, chronic respiratory failure with hypoxia at 2 L baseline, OSA.  Presents emergency department for shortness of breath.  States shortness of breath started around 30 minutes prior to arrival.  Has a history of COPD, does still smoke tobacco and is on 2 L of oxygen at baseline.  Has daily inhaler that she uses as well as DuoNebs that she can do 3 times daily as needed.  Has been doing them recently, without any difficulty.  Was feeling totally fine until just prior to arrival when she was at a social work evaluation and there were cleaning products in the bathroom that triggered her dyspnea.  She started coughing, is not coughing anything up.  Does have shortness of breath.  Can hear herself wheezing.  Denies any abdominal pain, rashes, tongue/throat/face swelling.  No nausea or vomiting.  Does have a little bit of chest tightness, but no true chest pain.  No fevers, chills, night sweats.  HPI     Home Medications Prior to Admission medications   Medication Sig Start Date End Date Taking? Authorizing Provider  predniSONE (DELTASONE) 50 MG tablet Take 1 tablet (50 mg total) by mouth daily with breakfast for 5 days. 01/22/22 01/27/22 Yes Phyllis Ginger, MD  albuterol (VENTOLIN HFA) 108 (90 Base) MCG/ACT inhaler Inhale 2 puffs into the lungs every 4 (four) hours as needed for shortness of breath or wheezing. 12/15/21   Dahal, Marlowe Aschoff, MD  ipratropium-albuterol (DUONEB) 0.5-2.5 (3) MG/3ML SOLN Take 3 mLs by nebulization every 6 (six) hours as needed (Use FIRST). 12/15/21 01/14/22   Dahal, Marlowe Aschoff, MD  fluticasone furoate-vilanterol (BREO ELLIPTA) 200-25 MCG/ACT AEPB Inhale 2 puffs into the lungs 2 (two) times daily. 12/15/21   Dahal, Marlowe Aschoff, MD  montelukast (SINGULAIR) 10 MG tablet Take 1 tablet (10 mg total) by mouth at bedtime. 12/15/21   Terrilee Croak, MD  citalopram (CELEXA) 20 MG tablet Take 1 tablet (20 mg total) by mouth daily. 03/07/19 07/23/19  Connye Burkitt, NP  lisinopril (ZESTRIL) 10 MG tablet Take 1 tablet (10 mg total) by mouth daily. 03/07/19 09/11/19  Connye Burkitt, NP  mometasone-formoterol (DULERA) 200-5 MCG/ACT AERO Inhale 2 puffs into the lungs 2 (two) times daily. Patient not taking: Reported on 12/12/2021 07/09/21 12/15/21  Hosie Poisson, MD  pantoprazole (PROTONIX) 40 MG tablet Take 1 tablet (40 mg total) by mouth daily. 03/07/19 07/23/19  Connye Burkitt, NP      Allergies    Patient has no known allergies.    Review of Systems   Review of Systems  Physical Exam Updated Vital Signs BP (!) 153/98 (BP Location: Left Arm)   Pulse 67   Temp 98.3 F (36.8 C) (Oral)   Resp 18   LMP 04/19/2018   SpO2 96%  Physical Exam Vitals and nursing note reviewed.  Constitutional:      General: She is not in acute distress.    Appearance: She is well-developed. She is not ill-appearing or diaphoretic.  HENT:     Head: Normocephalic and atraumatic.     Right Ear: External ear normal.  Left Ear: External ear normal.     Nose: Nose normal.     Mouth/Throat:     Mouth: Mucous membranes are moist.     Pharynx: Oropharynx is clear.  Eyes:     Conjunctiva/sclera: Conjunctivae normal.  Cardiovascular:     Rate and Rhythm: Normal rate and regular rhythm.     Heart sounds: No murmur heard. Pulmonary:     Effort: Pulmonary effort is normal. Tachypnea and prolonged expiration present. No accessory muscle usage, respiratory distress or retractions.     Breath sounds: No stridor. Examination of the right-upper field reveals wheezing. Examination of the left-upper  field reveals wheezing. Examination of the right-middle field reveals wheezing. Examination of the left-middle field reveals wheezing. Examination of the right-lower field reveals wheezing. Examination of the left-lower field reveals wheezing. Wheezing present.  Abdominal:     General: Abdomen is flat.     Palpations: Abdomen is soft.     Tenderness: There is no abdominal tenderness. There is no guarding or rebound.  Musculoskeletal:        General: No swelling.     Cervical back: Neck supple.     Right lower leg: No edema.     Left lower leg: No edema.  Skin:    General: Skin is warm and dry.     Capillary Refill: Capillary refill takes less than 2 seconds.     Findings: No rash.  Neurological:     General: No focal deficit present.     Mental Status: She is alert and oriented to person, place, and time.  Psychiatric:        Mood and Affect: Mood normal.        Behavior: Behavior normal.     ED Results / Procedures / Treatments   Labs (all labs ordered are listed, but only abnormal results are displayed) Labs Reviewed  COMPREHENSIVE METABOLIC PANEL - Abnormal; Notable for the following components:      Result Value   Glucose, Bld 111 (*)    All other components within normal limits  CBC WITH DIFFERENTIAL/PLATELET - Abnormal; Notable for the following components:   Neutro Abs 1.5 (*)    All other components within normal limits  RESP PANEL BY RT-PCR (RSV, FLU A&B, COVID)  [id number]  TROPONIN I (HIGH SENSITIVITY)  TROPONIN I (HIGH SENSITIVITY)    EKG None  Radiology DG Chest 2 View  Result Date: 01/22/2022 CLINICAL DATA:  Cough and shortness of breath. EXAM: CHEST - 2 VIEW COMPARISON:  Chest radiograph 12/12/2021 FINDINGS: The cardiomediastinal silhouette is unchanged with normal heart size. Right basilar opacities on the prior study have partially cleared, with mild residual asymmetric interstitial density remaining. The left lung is clear. No pleural effusion or pneumothorax  is identified. Thoracolumbar scoliosis and spinal fusion are noted. IMPRESSION: Largely cleared right basilar infiltrates with mild residual interstitial density remaining. No new finding. Electronically Signed   By: Logan Bores M.D.   On: 01/22/2022 13:08    Procedures Procedures    Medications Ordered in ED Medications  predniSONE (DELTASONE) tablet 60 mg (60 mg Oral Given 01/22/22 1707)  ipratropium-albuterol (DUONEB) 0.5-2.5 (3) MG/3ML nebulizer solution 3 mL (3 mLs Nebulization Given 01/22/22 1708)  magnesium sulfate IVPB 2 g 50 mL (0 g Intravenous Stopped 01/22/22 2053)  albuterol (PROVENTIL) (2.5 MG/3ML) 0.083% nebulizer solution (10 mg/hr Nebulization Given 01/22/22 1949)    ED Course/ Medical Decision Making/ A&P  Medical Decision Making Problems Addressed: Exacerbation of persistent asthma, unspecified asthma severity: acute illness or injury that poses a threat to life or bodily functions SOB (shortness of breath): acute illness or injury that poses a threat to life or bodily functions  Amount and/or Complexity of Data Reviewed Labs: ordered. Decision-making details documented in ED Course. Radiology: ordered and independent interpretation performed. Decision-making details documented in ED Course. ECG/medicine tests: ordered and independent interpretation performed. Decision-making details documented in ED Course.  Risk Prescription drug management.   Rachel Nolan is a 52 y.o. female with PMH of Past medical history of anxiety and depression, depression with psychotic features, hypertension, COPD, cocaine use disorder, alcohol use disorder, marijuana use disorder, neuropathic pain, asthma, prolonged QT, chronic respiratory failure with hypoxia at 2 L baseline, OSA, who presents to the ED with shortness of breath. Exam notable for diffuse wheezing, slight tachypnea, prolonged expiration. Afebrile, hemodynamically stable, SaO2 96% on room air.  Shortness  of breath came on quickly, she was in an area with some cleaning products which triggered a reaction from her.  She has a little bit of associated chest tightness, but no chest pain.  No recent infectious symptoms.  Differential diagnosis includes: pneumonia, COPD exacerbation, asthma exacerbation, CHF exacerbation, ACS, pulmonary embolism, pneumothorax. Unlikely PNA as CXR is clear and shows no signs of pneumonia, she had some right basilar opacities in the past which have now resolved, no leukocytosis, nonproductive cough, without fever.  Most likely COPD/Asthma exacerbation as history of such, with diffuse bilateral wheezing. Unlikely CHF exacerbation as no history of such, no bilateral lower extremity edema. Unlikely ACS as troponin is normal x 2, EKG without any signs of significant electrolyte abnormalities or ST changes concerning for ischemia, hear score of 3. Unlikely PE as atypical presentation, low risk per [facility].  Doubt Aortic Dissection, Pancreatitis, Pneumothorax, Arrhythmia, Endo/Myo/Pericarditis, Esophageal pathology, or other Emergent pathology.  Given 3 back-to-back DuoNeb's, oral prednisone, IV magnesium.   Most likely asthma exacerbation.  After reevaluation, she is still feeling short of breath and having wheezing.  Will put her on continuous albuterol and reassess.  Reassessment, her work of breathing has significantly improved, there is only faint bilateral expiratory wheezing.  At this time, believe she is stable for discharge home for treatment of her asthma exacerbation/pneumonitis.  Provided her resources for homeless shelters and other resources in the area, [facility]  Return precautions discussed including development of infectious symptoms, worsening shortness of breath.  Encouraged her to continue using her nebulizer at home every 4-6 hours as needed as well as her albuterol inhaler and the  prednisone.  The plan for this patient was discussed with Dr. Jeraldine Loots, who voiced agreement and who oversaw evaluation and treatment of this patient.          Final Clinical Impression(s) / ED Diagnoses Final diagnoses:  SOB (shortness of breath)  Exacerbation of persistent asthma, unspecified asthma severity    Rx / DC Orders ED Discharge Orders          Ordered    predniSONE (DELTASONE) 50 MG tablet  Daily with breakfast        01/22/22 2203              Gust Brooms, MD 01/22/22 2355    Gerhard Munch, MD 01/24/22 2249

## 2022-01-25 ENCOUNTER — Emergency Department (HOSPITAL_COMMUNITY)
Admission: EM | Admit: 2022-01-25 | Discharge: 2022-01-26 | Discharge status: Against Medical Advice | Payer: Commercial Managed Care - HMO | Attending: Emergency Medicine | Admitting: Emergency Medicine

## 2022-01-25 ENCOUNTER — Other Ambulatory Visit (HOSPITAL_COMMUNITY): Payer: Self-pay

## 2022-01-25 DIAGNOSIS — Z5321 Procedure and treatment not carried out due to patient leaving prior to being seen by health care provider: Secondary | ICD-10-CM | POA: Insufficient documentation

## 2022-01-25 NOTE — ED Notes (Signed)
Pt called a ride to come and get her

## 2022-01-28 ENCOUNTER — Other Ambulatory Visit: Payer: Self-pay

## 2022-02-03 ENCOUNTER — Other Ambulatory Visit: Payer: Self-pay

## 2022-02-24 ENCOUNTER — Emergency Department (HOSPITAL_COMMUNITY): Payer: Medicaid Other

## 2022-02-24 ENCOUNTER — Other Ambulatory Visit: Payer: Self-pay

## 2022-02-24 ENCOUNTER — Inpatient Hospital Stay (HOSPITAL_COMMUNITY)
Admission: EM | Admit: 2022-02-24 | Discharge: 2022-02-28 | DRG: 917 | Disposition: A | Discharge status: Home / Self Care | Payer: Medicaid Other | Attending: Internal Medicine | Admitting: Internal Medicine

## 2022-02-24 DIAGNOSIS — J690 Pneumonitis due to inhalation of food and vomit: Secondary | ICD-10-CM | POA: Diagnosis present

## 2022-02-24 DIAGNOSIS — Z597 Insufficient social insurance and welfare support: Secondary | ICD-10-CM | POA: Diagnosis not present

## 2022-02-24 DIAGNOSIS — Z809 Family history of malignant neoplasm, unspecified: Secondary | ICD-10-CM

## 2022-02-24 DIAGNOSIS — Z1152 Encounter for screening for COVID-19: Secondary | ICD-10-CM | POA: Diagnosis not present

## 2022-02-24 DIAGNOSIS — J189 Pneumonia, unspecified organism: Secondary | ICD-10-CM

## 2022-02-24 DIAGNOSIS — K0889 Other specified disorders of teeth and supporting structures: Secondary | ICD-10-CM | POA: Diagnosis present

## 2022-02-24 DIAGNOSIS — J45909 Unspecified asthma, uncomplicated: Secondary | ICD-10-CM | POA: Diagnosis present

## 2022-02-24 DIAGNOSIS — F419 Anxiety disorder, unspecified: Secondary | ICD-10-CM | POA: Diagnosis present

## 2022-02-24 DIAGNOSIS — J9621 Acute and chronic respiratory failure with hypoxia: Secondary | ICD-10-CM | POA: Diagnosis present

## 2022-02-24 DIAGNOSIS — I10 Essential (primary) hypertension: Secondary | ICD-10-CM | POA: Diagnosis present

## 2022-02-24 DIAGNOSIS — F1110 Opioid abuse, uncomplicated: Secondary | ICD-10-CM | POA: Diagnosis present

## 2022-02-24 DIAGNOSIS — J45901 Unspecified asthma with (acute) exacerbation: Secondary | ICD-10-CM | POA: Diagnosis present

## 2022-02-24 DIAGNOSIS — R0902 Hypoxemia: Secondary | ICD-10-CM | POA: Diagnosis present

## 2022-02-24 DIAGNOSIS — J9611 Chronic respiratory failure with hypoxia: Secondary | ICD-10-CM | POA: Diagnosis present

## 2022-02-24 DIAGNOSIS — Z66 Do not resuscitate: Secondary | ICD-10-CM | POA: Diagnosis present

## 2022-02-24 DIAGNOSIS — J441 Chronic obstructive pulmonary disease with (acute) exacerbation: Secondary | ICD-10-CM | POA: Diagnosis present

## 2022-02-24 DIAGNOSIS — F1210 Cannabis abuse, uncomplicated: Secondary | ICD-10-CM | POA: Diagnosis present

## 2022-02-24 DIAGNOSIS — F17210 Nicotine dependence, cigarettes, uncomplicated: Secondary | ICD-10-CM | POA: Diagnosis present

## 2022-02-24 DIAGNOSIS — E876 Hypokalemia: Secondary | ICD-10-CM | POA: Diagnosis present

## 2022-02-24 DIAGNOSIS — Z608 Other problems related to social environment: Secondary | ICD-10-CM | POA: Diagnosis present

## 2022-02-24 DIAGNOSIS — F1410 Cocaine abuse, uncomplicated: Secondary | ICD-10-CM | POA: Diagnosis present

## 2022-02-24 DIAGNOSIS — Z5902 Unsheltered homelessness: Secondary | ICD-10-CM | POA: Diagnosis not present

## 2022-02-24 DIAGNOSIS — Z59819 Housing instability, housed unspecified: Secondary | ICD-10-CM | POA: Insufficient documentation

## 2022-02-24 DIAGNOSIS — J984 Other disorders of lung: Secondary | ICD-10-CM | POA: Diagnosis not present

## 2022-02-24 DIAGNOSIS — T405X1A Poisoning by cocaine, accidental (unintentional), initial encounter: Secondary | ICD-10-CM | POA: Diagnosis present

## 2022-02-24 DIAGNOSIS — T5994XA Toxic effect of unspecified gases, fumes and vapors, undetermined, initial encounter: Secondary | ICD-10-CM

## 2022-02-24 LAB — CBC
HCT: 36.4 % (ref 36.0–46.0)
Hemoglobin: 11.6 g/dL — ABNORMAL LOW (ref 12.0–15.0)
MCH: 30.8 pg (ref 26.0–34.0)
MCHC: 31.9 g/dL (ref 30.0–36.0)
MCV: 96.6 fL (ref 80.0–100.0)
Platelets: 190 10*3/uL (ref 150–400)
RBC: 3.77 MIL/uL — ABNORMAL LOW (ref 3.87–5.11)
RDW: 14.9 % (ref 11.5–15.5)
WBC: 5.6 10*3/uL (ref 4.0–10.5)
nRBC: 0 % (ref 0.0–0.2)

## 2022-02-24 LAB — I-STAT VENOUS BLOOD GAS, ED
Acid-Base Excess: 4 mmol/L — ABNORMAL HIGH (ref 0.0–2.0)
Bicarbonate: 29.9 mmol/L — ABNORMAL HIGH (ref 20.0–28.0)
Calcium, Ion: 1.12 mmol/L — ABNORMAL LOW (ref 1.15–1.40)
HCT: 38 % (ref 36.0–46.0)
Hemoglobin: 12.9 g/dL (ref 12.0–15.0)
O2 Saturation: 64 %
Potassium: 3.4 mmol/L — ABNORMAL LOW (ref 3.5–5.1)
Sodium: 141 mmol/L (ref 135–145)
TCO2: 31 mmol/L (ref 22–32)
pCO2, Ven: 50.4 mmHg (ref 44–60)
pH, Ven: 7.381 (ref 7.25–7.43)
pO2, Ven: 35 mmHg (ref 32–45)

## 2022-02-24 LAB — I-STAT CHEM 8, ED
BUN: 15 mg/dL (ref 6–20)
Calcium, Ion: 1.08 mmol/L — ABNORMAL LOW (ref 1.15–1.40)
Chloride: 103 mmol/L (ref 98–111)
Creatinine, Ser: 0.6 mg/dL (ref 0.44–1.00)
Glucose, Bld: 114 mg/dL — ABNORMAL HIGH (ref 70–99)
HCT: 36 % (ref 36.0–46.0)
Hemoglobin: 12.2 g/dL (ref 12.0–15.0)
Potassium: 3.4 mmol/L — ABNORMAL LOW (ref 3.5–5.1)
Sodium: 141 mmol/L (ref 135–145)
TCO2: 28 mmol/L (ref 22–32)

## 2022-02-24 LAB — BASIC METABOLIC PANEL
Anion gap: 10 (ref 5–15)
BUN: 13 mg/dL (ref 6–20)
CO2: 27 mmol/L (ref 22–32)
Calcium: 8.7 mg/dL — ABNORMAL LOW (ref 8.9–10.3)
Chloride: 102 mmol/L (ref 98–111)
Creatinine, Ser: 0.68 mg/dL (ref 0.44–1.00)
GFR, Estimated: >60 mL/min (ref >60)
Glucose, Bld: 120 mg/dL — ABNORMAL HIGH (ref 70–99)
Potassium: 3.4 mmol/L — ABNORMAL LOW (ref 3.5–5.1)
Sodium: 139 mmol/L (ref 135–145)

## 2022-02-24 LAB — RESP PANEL BY RT-PCR (RSV, FLU A&B, COVID)  RVPGX2
Influenza A by PCR: NEGATIVE
Influenza B by PCR: NEGATIVE
Resp Syncytial Virus by PCR: NEGATIVE
SARS Coronavirus 2 by RT PCR: NEGATIVE

## 2022-02-24 LAB — TROPONIN I (HIGH SENSITIVITY)
Troponin I (High Sensitivity): 5 ng/L (ref <18)
Troponin I (High Sensitivity): 3 ng/L (ref <18)

## 2022-02-24 LAB — I-STAT BETA HCG BLOOD, ED (MC, WL, AP ONLY): I-stat hCG, quantitative: <5 m[IU]/mL (ref <5)

## 2022-02-24 MED ORDER — ALBUTEROL SULFATE HFA 108 (90 BASE) MCG/ACT IN AERS: 2.0000 | INHALATION_SPRAY | RESPIRATORY_TRACT | Status: DC | PRN

## 2022-02-24 MED ORDER — POTASSIUM CHLORIDE CRYS ER 20 MEQ PO TBCR
40.0000 meq | EXTENDED_RELEASE_TABLET | Freq: Two times a day (BID) | ORAL | Status: DC
  Administered 2022-02-24 – 2022-02-28 (×8): 40 meq via ORAL
  Filled 2022-02-24 (×8): qty 2

## 2022-02-24 MED ORDER — ENOXAPARIN SODIUM 40 MG/0.4ML IJ SOSY
40.0000 mg | PREFILLED_SYRINGE | INTRAMUSCULAR | Status: DC
  Administered 2022-02-24 – 2022-02-27 (×4): 40 mg via SUBCUTANEOUS
  Filled 2022-02-24 (×4): qty 0.4

## 2022-02-24 MED ORDER — AZITHROMYCIN 500 MG PO TABS
500.0000 mg | ORAL_TABLET | Freq: Every day | ORAL | Status: AC
  Administered 2022-02-25 – 2022-02-26 (×2): 500 mg via ORAL
  Filled 2022-02-24 (×2): qty 1

## 2022-02-24 MED ORDER — IPRATROPIUM-ALBUTEROL 0.5-2.5 (3) MG/3ML IN SOLN
3.0000 mL | Freq: Four times a day (QID) | RESPIRATORY_TRACT | Status: DC
  Administered 2022-02-25 – 2022-02-27 (×7): 3 mL via RESPIRATORY_TRACT
  Filled 2022-02-24 (×7): qty 3

## 2022-02-24 MED ORDER — IPRATROPIUM-ALBUTEROL 0.5-2.5 (3) MG/3ML IN SOLN: 3.0000 mL | Freq: Once | RESPIRATORY_TRACT | Status: DC

## 2022-02-24 MED ORDER — SODIUM CHLORIDE 0.9 % IV SOLN
1.0000 g | Freq: Once | INTRAVENOUS | Status: AC
  Administered 2022-02-24: 1 g via INTRAVENOUS
  Filled 2022-02-24: qty 10

## 2022-02-24 MED ORDER — ALBUTEROL SULFATE (2.5 MG/3ML) 0.083% IN NEBU
15.0000 mg/h | INHALATION_SOLUTION | Freq: Once | RESPIRATORY_TRACT | Status: AC
  Administered 2022-02-24: 15 mg/h via RESPIRATORY_TRACT
  Filled 2022-02-24: qty 3

## 2022-02-24 MED ORDER — FLUTICASONE FUROATE-VILANTEROL 200-25 MCG/ACT IN AEPB
2.0000 | INHALATION_SPRAY | Freq: Two times a day (BID) | RESPIRATORY_TRACT | Status: DC
  Administered 2022-02-25 – 2022-02-28 (×6): 2 via RESPIRATORY_TRACT
  Filled 2022-02-24 (×2): qty 28

## 2022-02-24 MED ORDER — PREDNISONE 20 MG PO TABS
40.0000 mg | ORAL_TABLET | Freq: Every day | ORAL | Status: AC
  Administered 2022-02-25 – 2022-02-28 (×4): 40 mg via ORAL
  Filled 2022-02-24 (×4): qty 2

## 2022-02-24 MED ORDER — GUAIFENESIN 100 MG/5ML PO LIQD
5.0000 mL | ORAL | Status: DC | PRN
  Administered 2022-02-24: 5 mL via ORAL
  Filled 2022-02-24: qty 5

## 2022-02-24 MED ORDER — ALBUTEROL SULFATE (2.5 MG/3ML) 0.083% IN NEBU
INHALATION_SOLUTION | RESPIRATORY_TRACT | Status: AC
  Filled 2022-02-24: qty 18

## 2022-02-24 MED ORDER — MONTELUKAST SODIUM 10 MG PO TABS
10.0000 mg | ORAL_TABLET | Freq: Every day | ORAL | Status: DC
  Administered 2022-02-24 – 2022-02-27 (×4): 10 mg via ORAL
  Filled 2022-02-24 (×4): qty 1

## 2022-02-24 MED ORDER — IPRATROPIUM-ALBUTEROL 0.5-2.5 (3) MG/3ML IN SOLN
3.0000 mL | Freq: Once | RESPIRATORY_TRACT | Status: AC
  Administered 2022-02-24: 3 mL via RESPIRATORY_TRACT
  Filled 2022-02-24: qty 3

## 2022-02-24 MED ORDER — SODIUM CHLORIDE 0.9 % IV SOLN
500.0000 mg | Freq: Once | INTRAVENOUS | Status: AC
  Administered 2022-02-24: 500 mg via INTRAVENOUS
  Filled 2022-02-24: qty 5

## 2022-02-24 NOTE — ED Triage Notes (Signed)
PT BIBGEMS for SOB with audible wheezing. Pt unable to speak in full sentences upon ems arrival. EMS gave an albuterol tx, two duo nebs, 125mg  solumedrol, and 2g mg. Pt oxygen in the 80s for ems on arrival but 100% during breathing treatment. Patient is alert and oriented, using accessory muscles. Pt informed staff she did use her home medications but had no improvement.   HX COPD  80 HR  100% during tx 190/98

## 2022-02-24 NOTE — Progress Notes (Signed)
Pt removed from bipap at this time.  Pt still has some albuterol running in the CAT and it was placed on pt at this time at 8lpm on oxygen.  Pt resting well and VS at [facility].  RT will continue to monitor.

## 2022-02-24 NOTE — H&P (Cosign Needed)
Date: 02/24/2022               Patient Name:  Rachel Nolan MRN: [id number]  DOB: [birth year] Age / Sex: 52 y.o., female   PCP: Romana Juniper, MD         Medical Service: Internal Medicine Teaching Service         Attending Physician: Dr. Velna Ochs, MD      First Contact: Dr. Drucie Opitz, MD Pager [phone]    Second Contact: Dr. Farrel Gordon, DO Pager [phone]         After Hours (After 5p/  First Contact Pager: [phone]  weekends / holidays): Second Contact Pager: [phone]   SUBJECTIVE   Chief Complaint: Shortness of Breath   History of Present Illness:   Ms. Rachel Nolan is a 52 year old female with history of anxiety, asthma, chronic hypoxic respiratory failure on 2L, COPD, and chronic cocaine use who presents with shortness of breath and wheezing. Last night patient smoked crack cocaine and has had trouble breathing. Immediately after smoking, she started coughing and could not catch her breath. She could not lie flat, and sitting up would relieve the symptoms. The cough is non-productive, and associated with centralized chest tightness. Cough is also associated with dry heaving, but no actual vomiting episodes. She attempted to use her breathing treatment, inhaler, and even increased her home oxygen, however did not improve her symptoms. She has had intermittent chills and constipation over the last three days.   She has been using cocaine intermittently since 1989. She has been trying to quit, and last time that she used before yesterday was one month ago.  Of note, she has chronic hypoxic respiratory failure on 2L of O2 at home that she uses anytime that she walks, but does not require oxygen when she sleeps or is resting. She has also had multiple emergency department visits for acute shortness of breath,  as well as multiple hospitalizations within the last year for pneumonia and COPD exacerbations   ED Course: Pt presented to the emergency department on  4L HFNC via EMS. She was given two duonebs, and 125 of solumedrol with no resolution by EMS.  BMP showed hypokalemia 3.4, CBC within normal limits, Troponins negative, COVID and Flu panel negative, VBG within normal limits. Chest X-Ray revealed acute right lower lung pneumonia. Pt was subsequently placed on BiPap. She was started on broad spectrum antibiotics with Rocephin, and Azithromycin. IMTS Consulted for Admission.  Meds:   -Singulair - Breo Ellipta  - Duonebs TID as needed  - Was supposed to switch to Trelegy after the above inhalers ran out   Past Medical History Anxiety  Asthma  Chronic Hypoxic Respiratory Failure on 2L  Chronic Cocaine Use    Past Surgical History:  Procedure Laterality Date   BACK SURGERY     ECTOPIC PREGNANCY SURGERY      Social:  Lives With: Homeless, sometimes stays with her sister Occupation: No Occupation Support: Sister in the area Level of Function: Able to perform all ADLs/IADLs indepndently  PCP: Dr. Romana Juniper  Substances: Has been smoking 3-4 cigarrettes a day since son was born 72 years ago. Previoulsy, she would smoke a pack a day since she was 52 years old.  No alcohol currently. Smokes cocaine (since 1989), has been trying to stop but used to do every other day. Smokes it. Used to use heroin but not currently.  Family History:   Uncle with cancer  Allergies: Allergies as of  02/24/2022   (No Known Allergies)    Review of Systems: A complete ROS was negative except as per HPI.   OBJECTIVE:   Physical Exam: Blood pressure 118/83, pulse 96, temperature 98.5 F (36.9 C), resp. rate 19, height 5\' 6"  (1.676 m), weight 87.1 kg, last menstrual period 04/19/2018, SpO2 100 %.  Constitutional: Ill-appearing woman, on BiPap, anxious HENT: normocephalic atraumatic, mucous membranes dry Eyes: conjunctiva non-erythematous Neck: supple Cardiovascular: regular rate and rhythm, no m/r/g Pulmonary/Chest: Diffuse wheezes heard  bilaterally, crackles heard on right base Abdominal: soft, non-tender, non-distended MSK: normal bulk and tone Neurological: alert & oriented x 3, 5/5 strength in bilateral upper and lower extremities, normal gait Skin: warm and dry Psych: Anxious mood and affect  Labs: CBC    Component Value Date/Time   WBC 5.6 02/24/2022 1233   RBC 3.77 (L) 02/24/2022 1233   HGB 12.2 02/24/2022 1241   HGB 12.9 02/24/2022 1241   HCT 36.0 02/24/2022 1241   HCT 38.0 02/24/2022 1241   PLT 190 02/24/2022 1233   MCV 96.6 02/24/2022 1233   MCH 30.8 02/24/2022 1233   MCHC 31.9 02/24/2022 1233   RDW 14.9 02/24/2022 1233   LYMPHSABS 2.6 01/22/2022 1242   MONOABS 0.7 01/22/2022 1242   EOSABS 0.1 01/22/2022 1242   BASOSABS 0.0 01/22/2022 1242     CMP     Component Value Date/Time   NA 141 02/24/2022 1241   NA 141 02/24/2022 1241   K 3.4 (L) 02/24/2022 1241   K 3.4 (L) 02/24/2022 1241   CL 103 02/24/2022 1241   CO2 27 02/24/2022 1233   GLUCOSE 114 (H) 02/24/2022 1241   BUN 15 02/24/2022 1241   CREATININE 0.60 02/24/2022 1241   CALCIUM 8.7 (L) 02/24/2022 1233   PROT 7.1 01/22/2022 1242   ALBUMIN 4.0 01/22/2022 1242   AST 19 01/22/2022 1242   ALT 11 01/22/2022 1242   ALKPHOS 59 01/22/2022 1242   BILITOT 0.3 01/22/2022 1242   GFRNONAA >60 02/24/2022 1233   GFRAA >60 08/28/2019 2026    Imaging:  DG Chest Portable 1 View  Result Date: 02/24/2022 CLINICAL DATA:  Shortness of breath and chest pain. EXAM: PORTABLE CHEST 1 VIEW COMPARISON:  01/22/2022 FINDINGS: Stable heart size and mediastinal contours. New airspace disease in the right lower lung is consistent with pneumonia. No associated pleural fluid, pneumothorax or pulmonary edema identified. Stable partially visualized spinal fusion rod. IMPRESSION: Acute right lower lung pneumonia. Electronically Signed   By: Aletta Edouard M.D.   On: 02/24/2022 13:18     EKG: personally reviewed my interpretation is normal rate, normal rhythm, no axis  deviation, normal intervals, similar to previous EKGs  ASSESSMENT & PLAN:   Assessment & Plan by Problem: Principal Problem:   COPD exacerbation (HCC)   Rachel Nolan is a 52 y.o. person living with a history of anxiety, asthma, COPD, and cocaine use who presented with shortness of breath and admitted for COPD Exacerbation on hospital day 0  #Acute on Chronic Hypoxic Respiratory Failure #RLL Pneumonia #COPD Exacerbation Pt presents with history of COPD on chronic 2L of oxygen at home after smoking crack cocaine last night. Diffuse wheezes were heard bilaterally, as well as crackles. Radiographic evidence of RLL pneumonia present. Pt is currently homeless, and denies any prodromal symptoms besides a cough. On examination, pt is on Bipap and saturating in the low 90s. Pt has been hospitalized 7 times in the last year for similar symptoms . No evidence of acidosis or  alkalosis on VBG. Pt currently homeless, which leaves her susceptible to community acquired pneumonia, and may be the reason she is susceptible She has also never had formal PFTs, due to lack of insurance. Will treat pneumonia with antibiotics, as well as using breathing treatments for COPD exacerbation.   Less concern for ACS given normal troponins and chest pain reproducible on palpation, as well as pulmonary embolism given no significant risk factors such as previous PE, malignancy, or prolonged immobilization.   Plan:  - Azithromycin and Ceftriaxone (Day 1), will transition to oral once patient respiratory status is improved  - Wean oxygen as tolerated to baseline of 2L - Prednisone 40mg  starting tomorrow for four more days, as she received solumedrol already today.   - Singulair 10mg   - Duoneb Q6hr - Breo Ellipta 2 puffs BID    #Polysubstance Abuse  Pt has very long history of using multiple substances such as marijuana, crack cocaine, and heroin. She states she has been trying to quit, however given her current social  situation and living on the streets, when people offer she is susceptible and relapses. This has been discussed with her at her previous hospitalizations as well. Seems this could also have played a part in her acute symptoms, as she developed symptoms almost immediately after smoking crack last night.   Plan:  - Counseling on quitting  - Offer resources for support groups   #Essential Hypertension  Pt was supposed to be amlodipine and lisinopril per last clinic visit, however given financial restraints she has not been able to obtain these medications. Blood pressure has been normotensive. Can consider addition of these if pt becomes hypertensive.   Plan:  - Monitor Blood Pressure    #Hypokalemia  Pt potassium currently at 3.4, and she has been supplemented in the emergency department. Likely in the setting of decreased PO intake or rescue inhaler use.   Plan:  - Repeat BMP in the AM  - Replete potassium as necessary  Diet: Normal VTE: Enoxaparin IVF: None,None Code: DNR  Prior to Admission Living Arrangement: Homeless Anticipated Discharge Location: SNF Barriers to Discharge: Medical Management  Dispo: Admit patient to Inpatient with expected length of stay greater than 2 midnights.  Signed: Drucie Opitz, MD Internal Medicine Resident PGY-1  02/24/2022, 4:04 PM   Dr. Drucie Opitz, MD Pager [phone]

## 2022-02-24 NOTE — ED Notes (Signed)
ED TO INPATIENT HANDOFF REPORT  ED Nurse Name and Phone #: Eartha Inch [phone]  S Name/Age/Gender Rachel Nolan 52 y.o. female Room/Bed: 028C/028C  Code Status   Code Status: DNR  Home/SNF/Other Home Patient oriented to: self, place, time, and situation Is this baseline? Yes   Triage Complete: Triage complete  Chief Complaint COPD exacerbation ([place]) [J44.1]  Triage Note PT BIBGEMS for SOB with audible wheezing. Pt unable to speak in full sentences upon ems arrival. EMS gave an albuterol tx, two duo nebs, 125mg  solumedrol, and 2g mg. Pt oxygen in the 80s for ems on arrival but 100% during breathing treatment. Patient is alert and oriented, using accessory muscles. Pt informed staff she did use her home medications but had no improvement.   HX COPD  25 HR  100% during tx 190/98   Allergies No Known Allergies  Level of Care/Admitting Diagnosis ED Disposition     ED Disposition  Admit   Condition  --   Comment  Hospital Area: [place] [[id number]]  Level of Care: Progressive [102]  Admit to Progressive based on following criteria: RESPIRATORY PROBLEMS hypoxemic/hypercapnic respiratory failure that is responsive to NIPPV (BiPAP) or High Flow Nasal Cannula (6-80 lpm). Frequent assessment/intervention, no > Q2 hrs < Q4 hrs, to maintain oxygenation and pulmonary hygiene.  May admit patient to [facility] or [facility] if equivalent level of care is available:: No  Covid Evaluation: Confirmed COVID Negative  Diagnosis: COPD exacerbation [facility]) [[id number]]  Admitting Physician: Velna Ochs [[id number]]  Attending Physician: Velna Ochs [[id number]]  Certification:: I certify this patient will need inpatient services for at least 2 midnights  Estimated Length of Stay: 2          B Medical/Surgery History Past Medical History:  Diagnosis Date   Anxiety    Asthma    COPD (chronic obstructive pulmonary disease) ([place])    Depression    Hypertension     Scoliosis    Past Surgical History:  Procedure Laterality Date   BACK SURGERY     ECTOPIC PREGNANCY SURGERY       A IV Location/Drains/Wounds Patient Lines/Drains/Airways Status     Active Line/Drains/Airways     Name Placement date Placement time Site Days   Peripheral IV 02/24/22 20 G Left;Posterior Hand 02/24/22  --  Hand  less than 1   Peripheral IV 02/24/22 18 G Right Antecubital 02/24/22  1252  Antecubital  less than 1            Intake/Output Last 24 hours No intake or output data in the 24 hours ending 02/24/22 1613  Labs/Imaging Results for orders placed or performed during the hospital encounter of 02/24/22 (from the past 48 hour(s))  Basic metabolic panel     Status: Abnormal   Collection Time: 02/24/22 12:33 PM  Result Value Ref Range   Sodium 139 135 - 145 mmol/L   Potassium 3.4 (L) 3.5 - 5.1 mmol/L   Chloride 102 98 - 111 mmol/L   CO2 27 22 - 32 mmol/L   Glucose, Bld 120 (H) 70 - 99 mg/dL    Comment: Glucose reference range applies only to samples taken after fasting for at least 8 hours.   BUN 13 6 - 20 mg/dL   Creatinine, Ser 0.68 0.44 - 1.00 mg/dL   Calcium 8.7 (L) 8.9 - 10.3 mg/dL   GFR, Estimated >60 >60 mL/min    Comment: (NOTE) Calculated using the CKD-EPI Creatinine Equation (2021)    Anion gap  10 5 - 15    Comment: Performed at [facility], [place] [address], [place], [place] [address]  CBC     Status: Abnormal   Collection Time: 02/24/22 12:33 PM  Result Value Ref Range   WBC 5.6 4.0 - 10.5 K/uL   RBC 3.77 (L) 3.87 - 5.11 MIL/uL   Hemoglobin 11.6 (L) 12.0 - 15.0 g/dL   HCT 36.4 36.0 - 46.0 %   MCV 96.6 80.0 - 100.0 fL   MCH 30.8 26.0 - 34.0 pg   MCHC 31.9 30.0 - 36.0 g/dL   RDW 14.9 11.5 - 15.5 %   Platelets 190 150 - 400 K/uL   nRBC 0.0 0.0 - 0.2 %    Comment: Performed at [facility], [place] [address] [place], [place] [address]  Troponin I (High Sensitivity)     Status: None   Collection Time: 02/24/22 12:33 PM   Result Value Ref Range   Troponin I (High Sensitivity) 5 <18 ng/L    Comment: (NOTE) Elevated high sensitivity troponin I (hsTnI) values and significant  changes across serial measurements may suggest ACS but many other  chronic and acute conditions are known to elevate hsTnI results.  Refer to the "Links" section for chest pain algorithms and additional  guidance. Performed at [facility], [place] [address], [place], [place] [address]   I-Stat beta hCG blood, ED     Status: None   Collection Time: 02/24/22 12:38 PM  Result Value Ref Range   I-stat hCG, quantitative <5.0 <5 mIU/mL   Comment 3            Comment:   GEST. AGE      CONC.  (mIU/mL)   <=1 WEEK        5 - 50     2 WEEKS       50 - 500     3 WEEKS       100 - 10,000     4 WEEKS     1,000 - 30,000        FEMALE AND NON-PREGNANT FEMALE:     LESS THAN 5 mIU/mL   Resp panel by RT-PCR (RSV, Flu A&B, Covid) Anterior Nasal Swab     Status: None   Collection Time: 02/24/22 12:39 PM   Specimen: Anterior Nasal Swab  Result Value Ref Range   SARS Coronavirus 2 by RT PCR NEGATIVE NEGATIVE   Influenza A by PCR NEGATIVE NEGATIVE   Influenza B by PCR NEGATIVE NEGATIVE    Comment: (NOTE) The Xpert Xpress SARS-CoV-2/FLU/RSV plus assay is intended as an aid in the diagnosis of influenza from Nasopharyngeal swab specimens and should not be used as a sole basis for treatment. Nasal washings and aspirates are unacceptable for Xpert Xpress SARS-CoV-2/FLU/RSV testing.  Fact Sheet for Patients: EntrepreneurPulse.com.au  Fact Sheet for Healthcare Providers: IncredibleEmployment.be  This test is not yet approved or cleared by the [place] FDA and has been authorized for detection and/or diagnosis of SARS-CoV-2 by FDA under an Emergency Use Authorization (EUA). This EUA will remain in effect (meaning this test can be used) for the duration of the COVID-19 declaration under Section 564(b)(1)  of the Act, 21 U.S.C. section 360bbb-3(b)(1), unless the authorization is terminated or revoked.     Resp Syncytial Virus by PCR NEGATIVE NEGATIVE    Comment: (NOTE) Fact Sheet for Patients: EntrepreneurPulse.com.au  Fact Sheet for Healthcare Providers: IncredibleEmployment.be  This test is not yet approved  or cleared by the [place] and has been authorized for detection and/or diagnosis of SARS-CoV-2 by FDA under an Emergency Use Authorization (EUA). This EUA will remain in effect (meaning this test can be used) for the duration of the COVID-19 declaration under Section 564(b)(1) of the Act, 21 U.S.C. section 360bbb-3(b)(1), unless the authorization is terminated or revoked.  Performed at [facility], [place] [address] [place],  [address]   I-stat chem 8, ED (not at [facility], [facility] or [facility])     Status: Abnormal   Collection Time: 02/24/22 12:41 PM  Result Value Ref Range   Sodium 141 135 - 145 mmol/L   Potassium 3.4 (L) 3.5 - 5.1 mmol/L   Chloride 103 98 - 111 mmol/L   BUN 15 6 - 20 mg/dL   Creatinine, Ser 0.60 0.44 - 1.00 mg/dL   Glucose, Bld 114 (H) 70 - 99 mg/dL    Comment: Glucose reference range applies only to samples taken after fasting for at least 8 hours.   Calcium, Ion 1.08 (L) 1.15 - 1.40 mmol/L   TCO2 28 22 - 32 mmol/L   Hemoglobin 12.2 12.0 - 15.0 g/dL   HCT 36.0 36.0 - 46.0 %  I-Stat venous blood gas, ([facility] ED, [facility], [facility])     Status: Abnormal   Collection Time: 02/24/22 12:41 PM  Result Value Ref Range   pH, Ven 7.381 7.25 - 7.43   pCO2, Ven 50.4 44 - 60 mmHg   pO2, Ven 35 32 - 45 mmHg   Bicarbonate 29.9 (H) 20.0 - 28.0 mmol/L   TCO2 31 22 - 32 mmol/L   O2 Saturation 64 %   Acid-Base Excess 4.0 (H) 0.0 - 2.0 mmol/L   Sodium 141 135 - 145 mmol/L   Potassium 3.4 (L) 3.5 - 5.1 mmol/L   Calcium, Ion 1.12 (L) 1.15 - 1.40 mmol/L   HCT 38.0 36.0 - 46.0 %   Hemoglobin 12.9 12.0 - 15.0 g/dL   Sample type VENOUS     Comment NOTIFIED PHYSICIAN    DG Chest Portable 1 View  Result Date: 02/24/2022 CLINICAL DATA:  Shortness of breath and chest pain. EXAM: PORTABLE CHEST 1 VIEW COMPARISON:  01/22/2022 FINDINGS: Stable heart size and mediastinal contours. New airspace disease in the right lower lung is consistent with pneumonia. No associated pleural fluid, pneumothorax or pulmonary edema identified. Stable partially visualized spinal fusion rod. IMPRESSION: Acute right lower lung pneumonia. Electronically Signed   By: Aletta Edouard M.D.   On: 02/24/2022 13:18    Pending Labs Unresulted Labs (From admission, onward)     Start     Ordered   02/25/22 0500  Comprehensive metabolic panel  Tomorrow morning,   R        02/24/22 1518   02/25/22 0500  CBC  Tomorrow morning,   R        02/24/22 1518            Vitals/Pain Today's Vitals   02/24/22 1415 02/24/22 1430 02/24/22 1445 02/24/22 1545  BP: 118/80 122/81 (!) 141/87 132/82  Pulse:   81   Resp: 19 19 16  (!) 26  Temp:      TempSrc:      SpO2:   100% 100%  Weight:      Height:      PainSc:        Isolation Precautions No active isolations  Medications Medications  cefTRIAXone (ROCEPHIN) 1 g in sodium chloride 0.9 % 100  mL IVPB (has no administration in time range)  azithromycin (ZITHROMAX) 500 mg in sodium chloride 0.9 % 250 mL IVPB (has no administration in time range)  enoxaparin (LOVENOX) injection 40 mg (has no administration in time range)  fluticasone furoate-vilanterol (BREO ELLIPTA) 200-25 MCG/ACT 2 puff (has no administration in time range)  montelukast (SINGULAIR) tablet 10 mg (has no administration in time range)  ipratropium-albuterol (DUONEB) 0.5-2.5 (3) MG/3ML nebulizer solution 3 mL (has no administration in time range)  predniSONE (DELTASONE) tablet 40 mg (has no administration in time range)  azithromycin (ZITHROMAX) tablet 500 mg (has no administration in time range)  albuterol (PROVENTIL) (2.5 MG/3ML) 0.083% nebulizer  solution (0 mg/hr Nebulization Hold 02/24/22 1305)    Mobility walks     Focused Assessments Pulmonary Assessment Handoff:  Lung sounds: Bilateral Breath Sounds: Expiratory wheezes L Breath Sounds: Expiratory wheezes, Diminished R Breath Sounds: Expiratory wheezes, Diminished O2 Device: Room Air      R Recommendations: See Admitting Provider Note  Report given to:   Additional Notes:

## 2022-02-24 NOTE — Hospital Course (Addendum)
Smoked crack cocaine yesterday and since then has had trouble breathing Immediately started having symptoms with a cough. Had to sit up to breathe.  Has chest pain when she coughs Hurts sometimes when she breathes  Said she is trying to stop using cocaine. Says she is threatened into using it? Before that it had been about 1 month She did her breathing treatment, increaased home o2 and nothing helped  When she laid down it felt like her airway was closing up  No fever, sometimes gets cold (last time was while she was here) but nothing before yesterday Gags when coughs bad sometimes but otherwise no nausea or vomiting Constipation but no diarrhea. Constipation for 2 days.  No LE edema  Uses 2L O2 at home. Needs anytime she's walking but not at sleep.  Pmh Copd Asthma Htn  Psh   Meds Albuterol Duoneb Breo ellipta Singulair *no changes since clinic visit in december  Allergies   Sochx Homeless. Mentions living with sister. Gets breathing treatments at her sisters apartment but can't stay there because sister does heroin? Sometimes wears diapers due to urinary incontinence. Smokes cigarettes 3-4/d since age 49y. Has previiously smoked at least 1 ppd but hasn't smoked that much in 37y. No alcohol currently. Uses cocaine (since 1989), has been trying to stop but used to do every other day. Smokes it. Used to use heroin but not currently.  Famhx M HTN F HTN  2/8 Only 2 hours of sleep last night due to cough and wheezing. Cough syrup not working or breathing treatment. Pain from coughing now. Perseverates on wheezing. Ate a little bit. No N/V. Trying to cough up phlegm. Says she got resources for substance use. Is going to call them. -very fidgety and restless  2/9 Evaluated at bedside. Reports adequate sleep and appetite. Endorses shortness of breath on evaluation, diffuse wheezing appreciated on exam. Earlier this morning denies difficulty ambulating to bathroom.  Ordered  amb pulse ox today. Satting at 84-86% on 3 L, increased to 5 L. And improved to 90-93%.    #Acute on Chronic Hypoxic Respiratory Failure  #RLL Pneumonitis  #COPD Exacerbation  Pt presents with history of COPD on chronic 2L of oxygen at home after smoking crack cocaine. Chest X-Ray positive for RLL pneumonia/pneumonitis. She was given multiple breathing treatments, and eventually was able to be weaned down to her home dose of 2L. Was also started on azithromycin and prendsione, for which she will continue taking for __ amount of days.   #Polysubstance Abuse Pt has very long history of using multiple substances such as marijuana, crack cocaine, and heroin. She states she has been trying to quit, however given her current social situation and living on the streets, when people offer she is susceptible and relapses. This has been discussed with her at her previous hospitalizations as well. Seems this could also have played a part in her acute symptoms, as she developed symptoms almost immediately after smoking crack last night. She has receivied multiple information packets on resources, and is determined to use them it seems.

## 2022-02-24 NOTE — Progress Notes (Signed)
Pt placed on BIPAP 10/5 on 40% and is tolerating well. RT will monitor. 

## 2022-02-24 NOTE — ED Provider Notes (Signed)
[facility] Provider Note   CSN: [id number] Arrival date & time: 02/24/22  1215     History  Chief Complaint  Patient presents with   Shortness of Breath    Rachel Nolan is a 52 y.o. female with history of anxiety, asthma, COPD, chronic cocaine use.  Patient presents to ED for evaluation of shortness of breath and wheezing.  Patient reports that she began having shortness of breath, wheezing last night after smoking crack.  Patient reports that she has daily crack cocaine usage.  Patient reports that she was recently discharged in the hospital secondary to her Cocaine usage.  Patient reports that after smoking crack last night she became short of breath, began wheezing.  Patient reports that she chronically wears 2 L of oxygen at baseline secondary to COPD.  Patient reports that due to her shortness of breath she increased her oxygen to 4 L/min however shortness of breath persisted.  Patient states that this morning she called EMS due to her shortness of breath.  Patient denies fevers, nausea, vomiting.  Patient does endorse chest pain.   Shortness of Breath Associated symptoms: chest pain and wheezing   Associated symptoms: no fever and no vomiting        Home Medications Prior to Admission medications   Medication Sig Start Date End Date Taking? Authorizing Provider  albuterol (VENTOLIN HFA) 108 (90 Base) MCG/ACT inhaler Inhale 2 puffs into the lungs every 4 (four) hours as needed for shortness of breath or wheezing. 12/15/21   Dahal, Marlowe Aschoff, MD  ipratropium-albuterol (DUONEB) 0.5-2.5 (3) MG/3ML SOLN Take 3 mLs by nebulization every 6 (six) hours as needed (Use FIRST). 12/15/21 01/14/22  Dahal, Marlowe Aschoff, MD  fluticasone furoate-vilanterol (BREO ELLIPTA) 200-25 MCG/ACT AEPB Inhale 2 puffs into the lungs 2 (two) times daily. 12/15/21   Dahal, Marlowe Aschoff, MD  montelukast (SINGULAIR) 10 MG tablet Take 1 tablet (10 mg total) by mouth at bedtime.  12/15/21   Terrilee Croak, MD  citalopram (CELEXA) 20 MG tablet Take 1 tablet (20 mg total) by mouth daily. 03/07/19 07/23/19  Connye Burkitt, NP  lisinopril (ZESTRIL) 10 MG tablet Take 1 tablet (10 mg total) by mouth daily. 03/07/19 09/11/19  Connye Burkitt, NP  mometasone-formoterol (DULERA) 200-5 MCG/ACT AERO Inhale 2 puffs into the lungs 2 (two) times daily. Patient not taking: Reported on 12/12/2021 07/09/21 12/15/21  Hosie Poisson, MD  pantoprazole (PROTONIX) 40 MG tablet Take 1 tablet (40 mg total) by mouth daily. 03/07/19 07/23/19  Connye Burkitt, NP      Allergies    Patient has no known allergies.    Review of Systems   Review of Systems  Constitutional:  Negative for fever.  Respiratory:  Positive for shortness of breath and wheezing.   Cardiovascular:  Positive for chest pain.  Gastrointestinal:  Negative for nausea and vomiting.  All other systems reviewed and are negative.   Physical Exam Updated Vital Signs BP (!) 141/87   Pulse 81   Temp 98.5 F (36.9 C)   Resp 16   Ht 5' 6"$  (1.676 m)   Wt 87.1 kg   LMP 04/19/2018   SpO2 100%   BMI 30.99 kg/m  Physical Exam Vitals and nursing note reviewed.  Constitutional:      General: She is in acute distress.     Appearance: She is ill-appearing.  HENT:     Head: Normocephalic and atraumatic.     Mouth/Throat:  Pharynx: Oropharynx is clear.  Eyes:     Extraocular Movements: Extraocular movements intact.     Conjunctiva/sclera: Conjunctivae normal.     Pupils: Pupils are equal, round, and reactive to light.  Cardiovascular:     Rate and Rhythm: Regular rhythm. Tachycardia present.  Pulmonary:     Effort: Respiratory distress present.     Breath sounds: Wheezing present.  Abdominal:     General: Abdomen is flat. Bowel sounds are normal.     Palpations: Abdomen is soft.     Tenderness: There is no abdominal tenderness.  Musculoskeletal:     Cervical back: Normal range of motion and neck supple. No tenderness.   Skin:    General: Skin is warm and dry.     Capillary Refill: Capillary refill takes less than 2 seconds.  Neurological:     Mental Status: She is alert and oriented to person, place, and time.     ED Results / Procedures / Treatments   Labs (all labs ordered are listed, but only abnormal results are displayed) Labs Reviewed  BASIC METABOLIC PANEL - Abnormal; Notable for the following components:      Result Value   Potassium 3.4 (*)    Glucose, Bld 120 (*)    Calcium 8.7 (*)    All other components within normal limits  CBC - Abnormal; Notable for the following components:   RBC 3.77 (*)    Hemoglobin 11.6 (*)    All other components within normal limits  I-STAT CHEM 8, ED - Abnormal; Notable for the following components:   Potassium 3.4 (*)    Glucose, Bld 114 (*)    Calcium, Ion 1.08 (*)    All other components within normal limits  I-STAT VENOUS BLOOD GAS, ED - Abnormal; Notable for the following components:   Bicarbonate 29.9 (*)    Acid-Base Excess 4.0 (*)    Potassium 3.4 (*)    Calcium, Ion 1.12 (*)    All other components within normal limits  RESP PANEL BY RT-PCR (RSV, FLU A&B, COVID)  RVPGX2  I-STAT BETA HCG BLOOD, ED ([facility], [facility], [facility] ONLY)  TROPONIN I (HIGH SENSITIVITY)  TROPONIN I (HIGH SENSITIVITY)    EKG EKG Interpretation  Date/Time:  Wednesday February 24 2022 12:21:35 EST Ventricular Rate:  85 PR Interval:  169 QRS Duration: 91 QT Interval:  393 QTC Calculation: 468 R Axis:   65 Text Interpretation: Sinus rhythm Confirmed by Fredia Sorrow [id number]) on 02/24/2022 1:56:47 PM  Radiology DG Chest Portable 1 View  Result Date: 02/24/2022 CLINICAL DATA:  Shortness of breath and chest pain. EXAM: PORTABLE CHEST 1 VIEW COMPARISON:  01/22/2022 FINDINGS: Stable heart size and mediastinal contours. New airspace disease in the right lower lung is consistent with pneumonia. No associated pleural fluid, pneumothorax or pulmonary edema identified. Stable partially  visualized spinal fusion rod. IMPRESSION: Acute right lower lung pneumonia. Electronically Signed   By: Aletta Edouard M.D.   On: 02/24/2022 13:18    Procedures Procedures   Medications Ordered in ED Medications  cefTRIAXone (ROCEPHIN) 1 g in sodium chloride 0.9 % 100 mL IVPB (has no administration in time range)  azithromycin (ZITHROMAX) 500 mg in sodium chloride 0.9 % 250 mL IVPB (has no administration in time range)  enoxaparin (LOVENOX) injection 40 mg (has no administration in time range)  albuterol (PROVENTIL) (2.5 MG/3ML) 0.083% nebulizer solution (0 mg/hr Nebulization Hold 02/24/22 1305)    ED Course/ Medical Decision Making/ A&P  Medical Decision Making Amount  and/or Complexity of Data Reviewed Labs: ordered. Radiology: ordered.  Risk Prescription drug management.   52 year old female presents to ED for evaluation.  Please see HPI for further details.  On examination patient wheezing diffusely, in respiratory distress.  Patient on nonrebreather receiving albuterol treatment.  Patient tachycardic on the monitor in normal sinus rhythm, afebrile.  Patient abdomen soft and compressible throughout.  Patient in obvious respiratory distress on arrival so we will proceed with chest x-ray, EKG, CBC, BMP, i-STAT VBG, i-STAT Chem-8, respiratory panel, troponin x 2.  Patient with active wheezing, states she is "tiring out" so we will transition patient over to BiPAP and initiate continuous DuoNeb at 62m/hr albuterol.  Patient CBC with stable hemoglobin of 11.6, no leukocytosis.  BMP with slightly decreased potassium of 3.4.  I-STAT Chem-8 shows again slightly decreased potassium to 3.4.  VBG shows increased bicarb.  Viral panel negative for all.  Troponin initially 5, delta pending.  Chest x-ray shows acute right lower lung pneumonia.  Patient started on broad-spectrum antibiotics to include Rocephin, azithromycin at this time.  Due to respiratory distress, hypoxia, Bipap status will  admit patient to internal medicine teaching service.  Update: Dr. DMarlou Sahas returned my call and agreed to admit the patient for management.  Patient stable at time of admission.  Final Clinical Impression(s) / ED Diagnoses Final diagnoses:  Exacerbation of asthma, unspecified asthma severity, unspecified whether persistent  Hypoxia  Community acquired pneumonia of right lower lobe of lung    Rx / DC Orders ED Discharge Orders     None         GLawana Chambers02/07/24 1522    ZFredia Sorrow MD 02/28/22 1524

## 2022-02-25 DIAGNOSIS — F17210 Nicotine dependence, cigarettes, uncomplicated: Secondary | ICD-10-CM

## 2022-02-25 DIAGNOSIS — J441 Chronic obstructive pulmonary disease with (acute) exacerbation: Secondary | ICD-10-CM | POA: Diagnosis not present

## 2022-02-25 DIAGNOSIS — J984 Other disorders of lung: Secondary | ICD-10-CM

## 2022-02-25 DIAGNOSIS — T5994XA Toxic effect of unspecified gases, fumes and vapors, undetermined, initial encounter: Secondary | ICD-10-CM

## 2022-02-25 DIAGNOSIS — J9621 Acute and chronic respiratory failure with hypoxia: Secondary | ICD-10-CM

## 2022-02-25 DIAGNOSIS — Z59819 Housing instability, housed unspecified: Secondary | ICD-10-CM | POA: Insufficient documentation

## 2022-02-25 LAB — COMPREHENSIVE METABOLIC PANEL
ALT: 12 U/L (ref 0–44)
AST: 20 U/L (ref 15–41)
Albumin: 3.5 g/dL (ref 3.5–5.0)
Alkaline Phosphatase: 53 U/L (ref 38–126)
Anion gap: 10 (ref 5–15)
BUN: 18 mg/dL (ref 6–20)
CO2: 27 mmol/L (ref 22–32)
Calcium: 9.1 mg/dL (ref 8.9–10.3)
Chloride: 103 mmol/L (ref 98–111)
Creatinine, Ser: 0.94 mg/dL (ref 0.44–1.00)
GFR, Estimated: >60 mL/min (ref >60)
Glucose, Bld: 100 mg/dL — ABNORMAL HIGH (ref 70–99)
Potassium: 3.6 mmol/L (ref 3.5–5.1)
Sodium: 140 mmol/L (ref 135–145)
Total Bilirubin: 0.5 mg/dL (ref 0.3–1.2)
Total Protein: 6.6 g/dL (ref 6.5–8.1)

## 2022-02-25 LAB — CBC
HCT: 34.8 % — ABNORMAL LOW (ref 36.0–46.0)
Hemoglobin: 11.6 g/dL — ABNORMAL LOW (ref 12.0–15.0)
MCH: 31.1 pg (ref 26.0–34.0)
MCHC: 33.3 g/dL (ref 30.0–36.0)
MCV: 93.3 fL (ref 80.0–100.0)
Platelets: 220 10*3/uL (ref 150–400)
RBC: 3.73 MIL/uL — ABNORMAL LOW (ref 3.87–5.11)
RDW: 14.7 % (ref 11.5–15.5)
WBC: 6.8 10*3/uL (ref 4.0–10.5)
nRBC: 0 % (ref 0.0–0.2)

## 2022-02-25 MED ORDER — GABAPENTIN 400 MG PO CAPS
400.0000 mg | ORAL_CAPSULE | Freq: Two times a day (BID) | ORAL | Status: DC | PRN
  Administered 2022-02-25 – 2022-02-28 (×4): 400 mg via ORAL
  Filled 2022-02-25 (×6): qty 1

## 2022-02-25 MED ORDER — QUETIAPINE FUMARATE 100 MG PO TABS
100.0000 mg | ORAL_TABLET | Freq: Every day | ORAL | Status: DC
  Administered 2022-02-25 – 2022-02-27 (×3): 100 mg via ORAL
  Filled 2022-02-25 (×3): qty 1

## 2022-02-25 NOTE — Progress Notes (Signed)
Bipap order was from when she was ED. Patient is not in any distress. Will continue to monitor.

## 2022-02-25 NOTE — Care Management (Signed)
  Transition of Care [facility]) Screening Note   Patient Details  Name: Sylwia Cuervo Date of Birth: 1971-01-07   Transition of Care [facility]) CM/SW Contact:    Levonne Lapping, RN Phone Number: 02/25/2022, 2:06 PM    Transition of Care Department [facility]) has reviewed patient .We will continue to monitor patient advancement through interdisciplinary progression rounds. If new patient transition needs arise, please place a TOC consult.

## 2022-02-25 NOTE — Progress Notes (Signed)
Subjective:   Summary: Rachel Nolan is a 52 y.o. year old female currently admitted on the IMTS HD#1 for aspiration pneumonitis.  Overnight Events: Pt reportedly having worsening shortness of breath, on evaluation she had expiratory wheezing and cough. Pt was given robitussin and extra duo neb  Pt was seen at bedside this AM. She only got around 2 hours of sleep last night due to cough and wheezing. She states her breathing has been around the same, and cough syrup has not been helping. She was able to eat a portion of her breakfast, No nausea or vomiting. She says she was given resources for substance use, and is going to call them.  Objective:  Vital signs in last 24 hours: Vitals:   02/25/22 0400 02/25/22 0600 02/25/22 0800 02/25/22 0819  BP: 128/86  (!) 147/116   Pulse: 91 65 89   Resp: 20     Temp: 98 F (36.7 C)     TempSrc: Oral     SpO2: 98% 99% 96% 99%  Weight:      Height:       Supplemental O2: Nasal Cannula 2L, saturating at 98%   Physical Exam:  Constitutional: Restless, anxious woman, in no acute distress Cardiovascular: RRR, no murmurs, rubs or gallops Pulmonary/Chest: Bilateral wheezes heard throughout lung field, normal work of breathing Abdominal: soft, non-tender, non-distended Skin: warm and dry Extremities: upper/lower extremity pulses 2+, no lower extremity edema present  Filed Weights   02/24/22 1219  Weight: 87.1 kg    No intake or output data in the 24 hours ending 02/25/22 1055 Net IO Since Admission: No IO data has been entered for this period [02/25/22 1055]  Pertinent Labs:    Latest Ref Rng & Units 02/25/2022    2:38 AM 02/24/2022   12:41 PM 02/24/2022   12:33 PM  CBC  WBC 4.0 - 10.5 K/uL 6.8   5.6   Hemoglobin 12.0 - 15.0 g/dL 11.6  12.2    12.9  11.6   Hematocrit 36.0 - 46.0 % 34.8  36.0    38.0  36.4   Platelets 150 - 400 K/uL 220   190        Latest Ref Rng & Units 02/25/2022    2:38 AM 02/24/2022   12:41 PM  02/24/2022   12:33 PM  CMP  Glucose 70 - 99 mg/dL 100  114  120   BUN 6 - 20 mg/dL 18  15  13    Creatinine 0.44 - 1.00 mg/dL 0.94  0.60  0.68   Sodium 135 - 145 mmol/L 140  141    141  139   Potassium 3.5 - 5.1 mmol/L 3.6  3.4    3.4  3.4   Chloride 98 - 111 mmol/L 103  103  102   CO2 22 - 32 mmol/L 27   27   Calcium 8.9 - 10.3 mg/dL 9.1   8.7   Total Protein 6.5 - 8.1 g/dL 6.6     Total Bilirubin 0.3 - 1.2 mg/dL 0.5     Alkaline Phos 38 - 126 U/L 53     AST 15 - 41 U/L 20     ALT 0 - 44 U/L 12       Imaging: DG Chest Portable 1 View  Result Date: 02/24/2022 CLINICAL DATA:  Shortness of breath and chest pain. EXAM: PORTABLE CHEST 1 VIEW COMPARISON:  01/22/2022 FINDINGS: Stable heart size and mediastinal contours. New airspace disease in the right lower lung is consistent with pneumonia. No associated pleural fluid, pneumothorax or pulmonary edema identified. Stable partially visualized spinal fusion rod. IMPRESSION: Acute right lower lung pneumonia. Electronically Signed   By: Aletta Edouard M.D.   On: 02/24/2022 13:18     Assessment/Plan:   Principal Problem:   COPD exacerbation ([place])   Patient Summary: Rachel Nolan is a 52 y.o. with a pertinent PMH of anxiety, asthma, chronic hypoxic respiratory failure on 2L, COPD, and chronic cocaine use , who presented with shortness of breath and admitted for RLL Pneumonitis.   #Acute on Chronic Hypoxic Respiratory Failure (resolving) #RLL Pneumonitis  #COPD Exacerbation  Pt states her breathing is around the same, however she is saturating well at her baseline of 2L of oxygen supplementation. On auscultation, she still has bilateral expiratory wheezes throughout the lung field, and looks very uncomfortable. Fortunately, she is on her home oxygen and saturating well. Will continue antibiotics, and narrow to just azithromycin as this seems to be more pneumonitis given onset of symptoms directly after her inhalation of crack cocaine,  likely causing a COPD exacerbation as well.   Plan:  - Continue Azithromycin (Day 2/5)  - Stopped ceftriaxone  - Continue Prednisone 40mg  (Day 2/5) - Continue scheduled Duonebs Q6H  - Breo Ellipta inhaler  - Robitussin for cough   #Anxiety  Pt has history of anxiety and has been on seroquel, will continue at half dose while keeping an eye on Qtc interval.   Plan:  - Seroquel 100mg  QHS  #Polysubstance Abuse Pt has been given multiple handouts and resources for cessation of cocaine use. Will continue to encourage cessation  #Essential Hypertension  Blood pressure has been normotensive.     Diet: Normal IVF: None,None VTE: Enoxaparin Code: DNR    Dispo: Anticipated discharge to Home in 2 days pending medical management.   Drucie Opitz, MD PGY-1 Internal Medicine Resident Pager Number [phone] Please contact the on call pager after 5 pm and on weekends at [phone].

## 2022-02-25 NOTE — Progress Notes (Signed)
Visited patient and discussed need for housing and contact with a CWS. Listened to patient's story. Prayed with patient for and about her direct needs.

## 2022-02-26 DIAGNOSIS — J9621 Acute and chronic respiratory failure with hypoxia: Secondary | ICD-10-CM | POA: Diagnosis not present

## 2022-02-26 DIAGNOSIS — F17210 Nicotine dependence, cigarettes, uncomplicated: Secondary | ICD-10-CM | POA: Diagnosis not present

## 2022-02-26 DIAGNOSIS — J984 Other disorders of lung: Secondary | ICD-10-CM | POA: Diagnosis not present

## 2022-02-26 DIAGNOSIS — J441 Chronic obstructive pulmonary disease with (acute) exacerbation: Secondary | ICD-10-CM | POA: Diagnosis not present

## 2022-02-26 LAB — BASIC METABOLIC PANEL
Anion gap: 8 (ref 5–15)
BUN: 22 mg/dL — ABNORMAL HIGH (ref 6–20)
CO2: 29 mmol/L (ref 22–32)
Calcium: 9.1 mg/dL (ref 8.9–10.3)
Chloride: 101 mmol/L (ref 98–111)
Creatinine, Ser: 0.68 mg/dL (ref 0.44–1.00)
GFR, Estimated: >60 mL/min (ref >60)
Glucose, Bld: 122 mg/dL — ABNORMAL HIGH (ref 70–99)
Potassium: 3.8 mmol/L (ref 3.5–5.1)
Sodium: 138 mmol/L (ref 135–145)

## 2022-02-26 LAB — CBC
HCT: 36 % (ref 36.0–46.0)
Hemoglobin: 12.4 g/dL (ref 12.0–15.0)
MCH: 31.7 pg (ref 26.0–34.0)
MCHC: 34.4 g/dL (ref 30.0–36.0)
MCV: 92.1 fL (ref 80.0–100.0)
Platelets: 216 10*3/uL (ref 150–400)
RBC: 3.91 MIL/uL (ref 3.87–5.11)
RDW: 15.1 % (ref 11.5–15.5)
WBC: 7.8 10*3/uL (ref 4.0–10.5)
nRBC: 0 % (ref 0.0–0.2)

## 2022-02-26 MED ORDER — SODIUM CHLORIDE 3 % IN NEBU
4.0000 mL | INHALATION_SOLUTION | Freq: Two times a day (BID) | RESPIRATORY_TRACT | Status: DC
  Filled 2022-02-26: qty 4

## 2022-02-26 MED ORDER — ACETAMINOPHEN 325 MG PO TABS
650.0000 mg | ORAL_TABLET | Freq: Four times a day (QID) | ORAL | Status: DC | PRN
  Administered 2022-02-26 – 2022-02-28 (×3): 650 mg via ORAL
  Filled 2022-02-26 (×3): qty 2

## 2022-02-26 MED ORDER — GUAIFENESIN 100 MG/5ML PO LIQD
10.0000 mL | ORAL | Status: DC
  Administered 2022-02-26 – 2022-02-28 (×8): 10 mL via ORAL
  Filled 2022-02-26 (×8): qty 10

## 2022-02-26 NOTE — Progress Notes (Signed)
Subjective:   Summary: Rachel Nolan is a 52 y.o. year old female currently admitted on the IMTS HD#2 for aspiration pneumonitis.  Overnight Events:   Pt was evaluated at bedside this AM. She states she is sleeping well and appetite. Earlier this morning denies difficulty ambulating to bathroom.  Satting at 84-86% on 3 L, increased to 5 L. And improved to 90-93%. She endorses shortness of breath on evaluation, diffuse wheezing appreciated on exam.  Objective:  Vital signs in last 24 hours: Vitals:   02/26/22 0402 02/26/22 0600 02/26/22 0752 02/26/22 0800  BP: 132/85   (!) 156/101  Pulse: 71 74  78  Resp: 17 (!) 23  17  Temp: 98.5 F (36.9 C)     TempSrc: Oral     SpO2: 100% 100% 99% 91%  Weight:      Height:       Supplemental O2: Nasal Cannula 2L, saturating at 98%   Physical Exam:  Constitutional: Restless, anxious woman, in no acute distress Cardiovascular: RRR, no murmurs, rubs or gallops Pulmonary/Chest: Bilateral wheezes heard throughout lung field, normal work of breathing Abdominal: soft, non-tender, non-distended Skin: warm and dry Extremities: upper/lower extremity pulses 2+, no lower extremity edema present  Filed Weights   02/24/22 1219  Weight: 87.1 kg    No intake or output data in the 24 hours ending 02/26/22 0951 Net IO Since Admission: No IO data has been entered for this period [02/26/22 0951]  Pertinent Labs:    Latest Ref Rng & Units 02/26/2022    5:08 AM 02/25/2022    2:38 AM 02/24/2022   12:41 PM  CBC  WBC 4.0 - 10.5 K/uL 7.8  6.8    Hemoglobin 12.0 - 15.0 g/dL 12.4  11.6  12.2    12.9   Hematocrit 36.0 - 46.0 % 36.0  34.8  36.0    38.0   Platelets 150 - 400 K/uL 216  220         Latest Ref Rng & Units 02/26/2022    5:08 AM 02/25/2022    2:38 AM 02/24/2022   12:41 PM  CMP  Glucose 70 - 99 mg/dL 122  100  114   BUN 6 - 20 mg/dL 22  18  15   $ Creatinine 0.44 - 1.00 mg/dL 0.68  0.94  0.60   Sodium 135 - 145 mmol/L 138  140   141    141   Potassium 3.5 - 5.1 mmol/L 3.8  3.6  3.4    3.4   Chloride 98 - 111 mmol/L 101  103  103   CO2 22 - 32 mmol/L 29  27    Calcium 8.9 - 10.3 mg/dL 9.1  9.1    Total Protein 6.5 - 8.1 g/dL  6.6    Total Bilirubin 0.3 - 1.2 mg/dL  0.5    Alkaline Phos 38 - 126 U/L  53    AST 15 - 41 U/L  20    ALT 0 - 44 U/L  12      Imaging: No results found.   Assessment/Plan:   Principal Problem:   COPD exacerbation ([facility]) Active Problems:   Cocaine use disorder (H[facility])   Asthma   Chronic respiratory failure with hypoxia ([facility]) - 2 L/min   Toxic inhalation injury   Housing situation unstable   Patient Summary: Rachel Nolan is a 53 y.o. with a pertinent  PMH of anxiety, asthma, chronic hypoxic respiratory failure on 2L, COPD, and chronic cocaine use , who presented with shortness of breath and admitted for RLL Pneumonitis.   #Acute on Chronic Hypoxic Respiratory Failure  #RLL Pneumonitis  #COPD Exacerbation  When patient was evaluated at bedside today, she became more dyspneic throughout the exam, and saturation started dropping to the mid 80s. She was on 2L of oxygen, but had to be increased to 5L. With increased oxygen, she was able to maintain saturations in the mid 90s. She continues to have diffuse wheezing on auscultation as well as persistent cough. Wil schedule robitussin and continue breathing treatments as well. Will also obtain ambulatory saturation.   Plan:  - Continue Azithromycin (Day 3/5)  - Continue Prednisone 92m (Day 3/5) - Continue scheduled Duonebs Q6H  - Breo Ellipta inhaler  - Robitussin for cough, scheduled  - Ambulatory saturation  #Anxiety  Pt has history of anxiety and has been on seroquel, will continue at half dose while keeping an eye on Qtc interval.   Plan:  - Seroquel 1012mQHS  #Polysubstance Abuse Pt has been given multiple handouts and resources for cessation of cocaine use. Will continue to encourage cessation  #Essential  Hypertension  Blood pressure has been normotensive.     Diet: Normal IVF: None,None VTE: Enoxaparin Code: DNR    Dispo: Anticipated discharge to Home in 2 days pending medical management.   SaDrucie OpitzMD PGY-1 Internal Medicine Resident Pager Number [phone] contact the on call pager after 5 pm and on weekends at [phone]

## 2022-02-26 NOTE — Discharge Instructions (Signed)
It was a pleasure taking care of you in the hospital. You were brought in for what may have been a lung injury secondary to smoking. Below are some resources you can use to help cut down and provide support. I am sending you a 30 day supply of the seroquel and gabapentin, please make an appointment with the clinic to follow up; I will have them give you a call as well. Take Care!      [employer] assistance programs Crisis assistance programs   -Part[facility] Ending Homelessness Heritage manager. If you are experiencing homelessness in [place], [place], your first point of contact should be Chiropractor. You can reach Coordinated Entry by calling [phone] or by emailing [email or web address].  Community access points: [employer] ([place]. [address], [place]) every Tuesday from [place] ([address]. [place]) every Wednesday from 8am-9am.   -The [employer] (573)834-3613) offers several services to local families, as funding allows. The Emergency Assistance Program (EAP), which they administer, provides household goods, free food, clothing, and financial aid to people in need in the [facility] area. The EAP program does have some qualification, and counselors will interview clients for financial assistance by written referral only. Referrals need to be made by the Department of Social Services or by other EAP approved human services agencies or charities in the area.  -Open Door Ministries of [employer], which can be reached at 850-512-2363, offers emergency assistance programs for those in need of help, such as food, rent assistance, a soup kitchen, shelter, and clothing. They are based in [facility] but provide a number of services to those that qualify for assistance.   -[place] may be able to offer temporary financial assistance  and cash grants for paying rent and utilities, Help may be provided for local county residents who may be experiencing personal crisis when other resources, including government programs, are not available. Call [phone]  -[facility] [place] is a [place], The organization can offer emergency assistance for paying rent, [employer], utilities, food, household products and furniture. They offer extensive emergency and transitional housing for families, children and single women, and also run a 41 and [employer]. Thrift Shops, Financial controller, and other aid offered too. [address] [place], [place], 6505771119  -[place] -- This is offered for [facility] families. The federal government created [employer] H[employer]away Program provides a one-time cash grant payment to help eligible low-income families pay their electric and heating bills. [address] [place], [place] [id number], (386) [phone]  -[place] Emergency Assistance -- A program offers emergency utility and rent funds for greater [employer] area residents. The program can also provide counseling and referrals to charities and government programs. Also provides food and a free meal program that serves lunch Mondays - Saturdays and dinner seven days per week to individuals in the community. [address] [place], [place], (825) [phone]  -[place] affordable apartment and housing communities across      [place] and [place]. The low income and seniors can access public housing, rental assistance to qualified applicants, and apply for the section 8 rent subsidy program. Other programs include Visual merchandiser and Barrister's clerk. [address] [place], [place], dial [phone].  -  The [facility]  provides transitional housing to veterans and the disabled. Clients will also access other services too, including assistance in applying for Disability, life skills classes, case management, and assistance in finding permanent housing. [address] [place], [place], call ([phone]  -[place] through [employer] is for people who were just evicted or that are formerly homeless. The non-profit will also help then gain self-sufficiency, find a home or apartment to live in, and also provides information on rent assistance when needed. Phone (857)169-0933  -The [place] Triad [employer] helps low income, elderly, or disabled residents in seven counties in the [place] ([place], [place], [place], [place], [place], [place], [place], and [place]) save energy and reduce their utility bills by improving energy efficiency. Phone [phone].  -[employer] is located in the [place] in the [employer], [address] [place] [address] [place], [place], [place] [address]. Parking is in the rear of the building. Phone: [phone]   General Email: info@[email or web address] .org  Mar-Mac provides free housing counseling assistance in locating affordable rental housing or housing with support services for families and individuals in crisis and the chronically homeless. We provide potential resources for other housing needs like utilities. Our trained counselors also work with clients on budgeting and financial literacy in effort to empower them to take control of their financial situations. [employer] collaborates with homeless service providers and other stakeholders as part of the [place] ([place]). The ([place]) is a regional/local planning body that coordinates housing and services funding for homeless families and individuals. The role of [place] in  the [place] is through housing counseling to work with people we serve on diversion strategies for those that are at imminent risk of becoming homeless. We also work with the Coordinated Assessment/Entry Specialist who attempts to find temporary solutions and/or connects the people to Housing First, [place] or transitional housing programs. [place] Counselors meet with clients on business days (Monday-Fridays, except scheduled holidays) from 8:30 am to 4:30 pm.  Legal assistance for evictions, foreclosure, and more -If you need free legal advice on civil issues, such as foreclosures, evictions, Mudlogger, government programs, domestic issues and more, Scientist, research (physical sciences) Aid of [facility]) is a Teacher, adult education firm that provides free legal services and counsel to lower income people, seniors, disabled, and others, The goal is to ensure everyone has access to justice and fair representation. Call them at [phone].  [facility] for Housing and Community Studies can provide info about obtaining legal assistance with evictions. Phone (281)805-9140.  Training [place]. offers job and Database administrator. Resources are focused on helping students obtain the skills and experiences that are necessary to compete in today's challenging and tight job market. The non-profit faith-based community action agency offers internship trainings as well as classroom instruction. Classes are tailored to meet the needs of people in the [facility] region. [place], [place] [address], [phone]  Foreclosure prevention/Debt Services Family Services of the [place] and foreclosure prevention programs for local families. This includes money management, financial advice, budget review and development of a written action plan with a Higher education careers adviser to help  solve specific individual financial problems. In addition, housing and mortgage counselors can also provide pre- and post-purchase homeownership counseling, default resolution counseling (to prevent foreclosure) and reverse mortgage counseling. A Debt  Management Program allows people and families with a high level of credit card or medical debt to consolidate and repay consumer debt and loans to creditors and rebuild positive credit ratings and scores. Contact (336) U8783921.  Community clinics in [place] [facility]: [address], [place], [place]. [phone].  -Health Department [place] Clinic: [address], [facility], [address]. [phone].  -[place] offers medical care through a group of doctors, pharmacies and other healthcare related agencies that offer services for low income, uninsured adults in [place]. Also offers adult Dental care and assistance with applying for an [employer]. Call [phone].   -[place]. This center provides low-cost health care to those without health insurance. Services offered include an onsite pharmacy. Phone [phone]. [address], [place], [place].  -Medication Assistance Program serves as a link between pharmaceutical companies and patients to provide low cost or free prescription medications. This service is available for residents who meet certain income restrictions and have no insurance coverage. PLEASE CALL [phone] Lady Gary) OR ([phone] ([place])  -One Step Further: Merchant navy officer, The [employer] Support & Nutrition Program, The [employer]. Call 8571708470/ ([phone]) [phone].  Food pantry and assistance -Urban Ministry-Food Bank: [address], [place] [address]. Phone ([phone]  -Blessed Table Food Pantry: [address], [place], [place] [address]. (660)224-8095.  -[place]: https://findfood.BidStrong.co.za  FLEEING VIOLENCE:  -Family Services of the [place]- 24/7 Crisis line 424-826-0440) -[place]: (336) 641-SAFE (352)[phone])   [place] 2-1-1 is another useful way to locate resources in the community. Visit PricingGame.co.uk to find service information online. If you need additional assistance, 2-1-1 Referral Specialists are available 24 hours a day, every day by dialing 2-1-1 or 516-713-4051 from any phone. The call is free, confidential, and available in any language.  Affordable Housing Search  http://www.nchousingsearch.[facility])   M-F 8a-3p [address], [place] [address] [phone] Services include: laundry, barbering, support groups, case management, phone & computer access, showers, AA/NA mtgs, mental health/substance abuse nurse, job skills class, disability information, VA assistance, spiritual classes, etc. Winter Shelter available when temperatures are less than 32 degrees.   HOMELESS [place] at [facility]- Call [phone] ext. 347 or ext. 336. Located at [address], [place], [place] [address]  Open [place]- Call [phone]. Located at [address] [place].  [place]. Call ([phone]. Office located at [address] [place].  Pathways Family Housing through [place] ([phone].  [place]- Call [phone]. Located at [place], [place], [place] [address].  Room at the Inn-For Pregnant mothers. Call [phone]. Located at [address] [place], [place].  [place] Shelter of Hope-For men in [place]. Call [phone].  Home of Levi Strauss for [employer] [phone]. Office located at [place]. [address] [place],  [place].  Du Pont be agreeable to help with chores. Call [phone] ext. 5000.  Men's: [place]., [place], [place] [address]. Women's: [facility]  [address] [place], [place] [address]  Crisis Services [facility]rapeutic Alternatives Mobile Crisis Management- [phone]  [facility] [address] [place],  [address]. Phone: ([phone]    In a  time of Crisis: Therapeutic Alternatives, inc.  Mobile Crisis Management provides immediate crisis response, 24/7.  Call [phone]  [facility] for MH/DD/SA [facility] is available 24 hours a day, 7 days a week. Customer Service Specialists will assist you to find a crisis provider that is well-matched with your needs. Your local number is: [phone]  [facility]/Behavioral Health Urgent Care ([place]) IOP, individual counseling, medication management [place], [place] [address] [phone] Call for intake hours; Medicaid and Uninsured    Outpatient Providers  Alcohol and Drug Services (ADS) Group and individual counseling. [address]  [place], [place] [address] [phone] [place]: [phone]  [place]: [phone] Medicaid and uninsured.   The [facility] IOP groups multiple times per week. [place], [place], [place] [address] [phone] Takes Medicaid and other insurances.   [place] Outpatient  Chemical Dependency Intensive Outpatient Program (IOP) [address] [place], [place] [address] [phone] Takes Pharmacist, community and [place].   [place]  IOP and Partial Hospitalization Program  [place].  [place], [place] [address] [phone] Private Insurance, [place] only for partial hospitalization  ACDM Assessment and Counseling of [place]., [place], [place], [place] [address] [phone] Monday-Friday. Short  and Long term options. [place] [facility]/Behavioral Health Urgent Care ([place]) IOP, individual counseling, medication management [place], [place] [address] [phone] Medicaid and [facility]  [place] [address]  [place], [place] [address] [phone] Private Insurance and [place] Outpatient [address]  [place], [place] [address] [phone] Private Insurance, [place], and Self Pay   [facility]: Methadone Clinic  [place], [place] [address] [facility]  [address]  [place], [place] [address] [phone]  Caring Services  [address] [place], [place] [address] [phone]      Residential Treatment Programs  [facility] ([place].) [place], [place] [address] [phone] or [phone] Detox and Residential Rehab 14 days (Medicare, Medicaid, private insurance, and self pay). No methadone. Call for pre-screen.   [facility]) [place], [place] [address] [phone] Detox (self Pay and Medicaid Limited availability) Rehab Only Female (Medicare, Medicaid, and Self Pay)-No methadone.  Fellowship [address] [place], [place] [address] [phone] or [phone] Private Insurance only  [place] PHONE: [phone] FAX: [phone] Residential program for women 21 and over for up to a year through a Christian 12-step recovery model. Self-pay.       Path of [place], [place] [address] Phone:  [phone] Must be detoxed 72 hours prior to admission; 28 day program.  Self-pay.  [facility] [place], [place] [phone] [employer], Medicare, [place] (not straight [place]). They offer assistance with transportation.   [facility] [address] [place],   [place],  [address] [phone] Christian Based Program. Men only. No insurance  [facility] is a substance use disorder treatment program for women, including those who are pregnant, parenting, and/or whose lives have been touched by abuse and violence. [phone]) [phone]         [facility] [address] [place],  [address] Women's: [phone] Men's: [phone] No Medicaid.   Addiction  Centers of [place]. (mainly [place]) willing to help with transportation.  [phone] [employer]. [place].  [place], [place] [address] [phone] Treatment Only, must make assessment appointment, and must be sober for assessment appointment. Self pay, Medicare A and B, [facility], must be [facility] resident. No methadone.   [place], [place] [address] [phone] No pending legal charges, Long-term work program. No methadone. Call for assessment.  [facility]  [address], [place], [place] [address] [phone] or [phone] [place] [phone] Private Insurance (no [place]). Males/Females, call to make referrals, multiple facilities   [facility] [address]  [place], [place] [address]  [phone] Men Only Upfront Fee [place] Women's Program: [facility] [place], [place] [address] [phone]  [facility]-no methadone [facility] [address] [place], [place] [address] [phone] (f)  [place], [place] [address] [phone] [facility] [phone] [place], [place] For women, houses 8 residents for sober living. No Medicaid.          Syringe Services Program Due to COVID-19, syringe services programs are likely operating under different hours with limited or no fixed site hours. Some programs may not be operating at all. Please contact the program directly using the phone numbers provided below to see if they are still operating under Marathon Solution to the Opioid Problem (GCSTOP) Fixed; mobile; peer-based Midge Aver [phone] [email or web address] Fixed site exchange at [facility] [place], [place]. [place], [place] [address] on Wednesdays (2:00 - 5:00 pm) and Thursdays (4:00 - 8:00 pm). Pop-up mobile exchange locations: [employer] and [employer] Lot, [address] [place], [place] [address] on Tuesdays (11:00 am - 1:00 pm) and Fridays (11:00 am - 1:00 pm) -[address], [place], [place] [address] on Tuesdays (2:00 - 4:00 pm) and Fridays (2:00 - 4:00 pm) -[place] [facility] - also serves [place] and [employer] [place] [employer] Fixed; mobile; peer-based Rosemary Holms [phone] [email or web address] [address], [facility], [place] [address] Delivery and outreach available in [place] and [place], please call for more information. Monday, Tuesday: 1:00 -7:00 pm Thursday: 4:00 pm - 8:00 pm Friday: 1:00 pm - 8:00 pm)       Medication-Assisted Treatment (MAT) -[place]- services [place] and surrounding areas including [place], [place], [place], [place], [place], [place], [place], [place], [place], and [place], [place]. Options include Methadone, buprenorphine or Suboxone. [address] [place], [place] [address] Phone: [phone] Mon - Fri: 5:30am - 2:00pm Sat: 5:30am -7:30am Sun: Closed Holidays: 6:00am - 8:00am  -[facility] [place]- We use FDA-approved medications, like methadone/suboxone/sublocade, and vivitrol. These medications are then combined with customized  care plans that include individual or group counseling, toxicology, and medical care directed by on-site physicians. Accepts most insurance plans, Medicaid, and private pay.  [place],  [address] Phone: [phone] Monday-Friday 5:00 AM - 10:00 AM Saturday 6:00 AM - 8:30 AM Sunday 6:00 AM - 7:00 AM  -Alcohol & Drug Services- ADS is a treatment & recovery focused program. In addition to receiving methadone medication, our clients participate in individual and group counseling as well as random drug testing. If accepted into the  ADS Opioid Program, you will be provided several intake appointments and a physical exam [place], [place] [address] Office: [phone]  Fax: [phone]  -[facility]- We put our community members at the center of everything we do, for remote treatment services as well as in-person, from alcohol withdrawal to opioid use and more.  [place], [place], [place], [place] [address] [phone] Monday-Wednesday: 9:00am - 5:00pm Thursday: 9:00am - 6:00pm Friday: 9:00am - 5:00pm Saturday: 9:00am - 1:00pm Sunday: Closed   -[facility] [employer] [place]) [address] [place], [place] [address] [phone]  [place] [phone] [address] [place],  [address]  M-W    5:00am-12:00pm Thu     5:00am-10:00am Fri       5:00am-12:00pm Sat      5:00am-8:00am Sun     Closed  $12/daily for Methadone Treatment.

## 2022-02-26 NOTE — Progress Notes (Signed)
Patient is not in distress. Bipap order was from when patient came to ED. Patient states does not wear a cpap or bipap at home but wears oxygen. Will continue to monitor.

## 2022-02-27 LAB — BASIC METABOLIC PANEL
Anion gap: 11 (ref 5–15)
BUN: 20 mg/dL (ref 6–20)
CO2: 23 mmol/L (ref 22–32)
Calcium: 9 mg/dL (ref 8.9–10.3)
Chloride: 104 mmol/L (ref 98–111)
Creatinine, Ser: 0.66 mg/dL (ref 0.44–1.00)
GFR, Estimated: >60 mL/min (ref >60)
Glucose, Bld: 120 mg/dL — ABNORMAL HIGH (ref 70–99)
Potassium: 3.7 mmol/L (ref 3.5–5.1)
Sodium: 138 mmol/L (ref 135–145)

## 2022-02-27 LAB — CBC
HCT: 35 % — ABNORMAL LOW (ref 36.0–46.0)
Hemoglobin: 12.1 g/dL (ref 12.0–15.0)
MCH: 31.9 pg (ref 26.0–34.0)
MCHC: 34.6 g/dL (ref 30.0–36.0)
MCV: 92.3 fL (ref 80.0–100.0)
Platelets: 205 10*3/uL (ref 150–400)
RBC: 3.79 MIL/uL — ABNORMAL LOW (ref 3.87–5.11)
RDW: 15.5 % (ref 11.5–15.5)
WBC: 8.2 10*3/uL (ref 4.0–10.5)
nRBC: 0 % (ref 0.0–0.2)

## 2022-02-27 MED ORDER — IPRATROPIUM-ALBUTEROL 0.5-2.5 (3) MG/3ML IN SOLN: 3.0000 mL | Freq: Four times a day (QID) | RESPIRATORY_TRACT | Status: DC | PRN

## 2022-02-27 MED ORDER — MAGIC MOUTHWASH W/LIDOCAINE: 2.0000 mL | Freq: Three times a day (TID) | ORAL | Status: DC | PRN

## 2022-02-27 NOTE — Progress Notes (Cosign Needed Addendum)
Subjective:   Summary: Saila Havlik is a 52 y.o. year old female currently admitted on the [facility] HD#3 for aspiration pneumonitis.  Patient evaluated at bedside this morning.  Reports shortness of breath has improved compared to yesterday, wheezing improved.  Complains of significant dental pain related to eating, request however that her diet not be changed to softer foods. Weaned from 6L to 4L, satting well. Reports she had BM this morning. SOB improved today.   Objective:  Vital signs in last 24 hours: Vitals:   02/27/22 0000 02/27/22 0405 02/27/22 0800 02/27/22 0801  BP: (!) 134/94 (!) 134/94 (!) 159/97   Pulse: 93 81    Resp: (!) 25 16    Temp:  (!) 97.2 F (36.2 C)    TempSrc:  Oral    SpO2: 93% 96%  98%  Weight:      Height:       Supplemental O2: Nasal Cannula 4L, saturating at 98%   Physical Exam:  Constitutional: Restless, anxious woman, in no acute distress HEENT: Poor, carious dentition,  no lesions or acute traumas to the mouth. Moist oral mucosa.  Cardiovascular: RRR, no murmurs, rubs or gallops Pulmonary/Chest: Subtle bilateral wheezes heard at lung bases, improved from prior.  Normal work of breathing Abdominal: soft, non-tender, non-distended Skin: warm and dry Extremities: upper/lower extremity pulses 2+, no lower extremity edema present  Filed Weights   02/24/22 1219  Weight: 87.1 kg    No intake or output data in the 24 hours ending 02/27/22 1208 Net IO Since Admission: No IO data has been entered for this period [02/27/22 1208]  Pertinent Labs:    Latest Ref Rng & Units 02/27/2022    3:22 AM 02/26/2022    5:08 AM 02/25/2022    2:38 AM  CBC  WBC 4.0 - 10.5 K/uL 8.2  7.8  6.8   Hemoglobin 12.0 - 15.0 g/dL 12.1  12.4  11.6   Hematocrit 36.0 - 46.0 % 35.0  36.0  34.8   Platelets 150 - 400 K/uL 205  216  220        Latest Ref Rng & Units 02/27/2022    3:22 AM 02/26/2022    5:08 AM 02/25/2022    2:38 AM  CMP  Glucose 70 - 99 mg/dL  120  122  100   BUN 6 - 20 mg/dL 20  22  18   $ Creatinine 0.44 - 1.00 mg/dL 0.66  0.68  0.94   Sodium 135 - 145 mmol/L 138  138  140   Potassium 3.5 - 5.1 mmol/L 3.7  3.8  3.6   Chloride 98 - 111 mmol/L 104  101  103   CO2 22 - 32 mmol/L 23  29  27   $ Calcium 8.9 - 10.3 mg/dL 9.0  9.1  9.1   Total Protein 6.5 - 8.1 g/dL   6.6   Total Bilirubin 0.3 - 1.2 mg/dL   0.5   Alkaline Phos 38 - 126 U/L   53   AST 15 - 41 U/L   20   ALT 0 - 44 U/L   12     Imaging: No results found.   Assessment/Plan:   Principal Problem:   COPD exacerbation (HCC) Active Problems:   Cocaine use disorder (HCC)   Asthma   Chronic respiratory failure with hypoxia (HCC) - 2 L/min   Toxic inhalation injury  Housing situation unstable   Patient Summary: Ranim Hertling is a 52 y.o. with a pertinent PMH of anxiety, asthma, chronic hypoxic respiratory failure on 2L, COPD, and chronic cocaine use , who presented with shortness of breath and admitted for RLL Pneumonitis.   #Acute on Chronic Hypoxic Respiratory Failure (resolving) #RLL Pneumonitis  #COPD Exacerbation  Patient on 6 L of oxygen when seen this morning, weaned down to 4 L during evaluation and continued satting well.  Wheezing has improved, subtle and only appreciated at lung bases today.  Patient reports she requires an oxygen tank outside hospital. - Continue to wean off oxygen.  - Continue Azithromycin (Day 2/5)  - Continue Prednisone 33m (Day 2/5) - Continue scheduled Duonebs Q6H  - Continue Breo Ellipta inhaler  - Robitussin for cough   #Anxiety  Reports improvement with Seroquel.   -Continue Seroquel [id number]  #Polysubstance Abuse Pt has been given multiple handouts and resources for cessation of cocaine use. Will continue to encourage cessation  #Essential Hypertension  Remains normotensive  #Poor dentition Reports significant discomfort when eating breakfast.  Declines to change diet to softer foods. - Start mouthwash with  lidocaine 3 times daily as needed  Diet: Normal IVF: None,None VTE: Enoxaparin Code: DNR  Dispo: TBD, complicated by housing instability, poor social support, and reliable access to transportation/ follow up appointments. TOC consulted for assistance in formulating safe discharge.   MaChristene SlatesPGY-1 Internal Medicine Residency Pager: [phone]

## 2022-02-27 NOTE — Progress Notes (Signed)
Pt not in distress, no bipap needed at time

## 2022-02-28 LAB — BASIC METABOLIC PANEL
Anion gap: 11 (ref 5–15)
BUN: 24 mg/dL — ABNORMAL HIGH (ref 6–20)
CO2: 24 mmol/L (ref 22–32)
Calcium: 8.8 mg/dL — ABNORMAL LOW (ref 8.9–10.3)
Chloride: 104 mmol/L (ref 98–111)
Creatinine, Ser: 0.76 mg/dL (ref 0.44–1.00)
GFR, Estimated: >60 mL/min (ref >60)
Glucose, Bld: 123 mg/dL — ABNORMAL HIGH (ref 70–99)
Potassium: 3.8 mmol/L (ref 3.5–5.1)
Sodium: 139 mmol/L (ref 135–145)

## 2022-02-28 LAB — CBC
HCT: 34.5 % — ABNORMAL LOW (ref 36.0–46.0)
Hemoglobin: 11.3 g/dL — ABNORMAL LOW (ref 12.0–15.0)
MCH: 31.2 pg (ref 26.0–34.0)
MCHC: 32.8 g/dL (ref 30.0–36.0)
MCV: 95.3 fL (ref 80.0–100.0)
Platelets: 205 10*3/uL (ref 150–400)
RBC: 3.62 MIL/uL — ABNORMAL LOW (ref 3.87–5.11)
RDW: 15.8 % — ABNORMAL HIGH (ref 11.5–15.5)
WBC: 9.6 10*3/uL (ref 4.0–10.5)
nRBC: 0 % (ref 0.0–0.2)

## 2022-02-28 MED ORDER — GABAPENTIN 400 MG PO CAPS
400.0000 mg | ORAL_CAPSULE | Freq: Two times a day (BID) | ORAL | 0 refills | Status: DC | PRN
  Filled 2022-02-28: qty 60, 30d supply, fill #0

## 2022-02-28 MED ORDER — QUETIAPINE FUMARATE 100 MG PO TABS
100.0000 mg | ORAL_TABLET | Freq: Every day | ORAL | 0 refills | Status: DC
  Filled 2022-02-28: qty 30, 30d supply, fill #0

## 2022-02-28 MED ORDER — FLUTICASONE FUROATE-VILANTEROL 200-25 MCG/ACT IN AEPB
1.0000 | INHALATION_SPRAY | Freq: Every day | RESPIRATORY_TRACT | 0 refills | Status: DC
  Filled 2022-02-28: qty 28, 28d supply, fill #0
  Filled 2022-03-01: qty 60, 30d supply, fill #0

## 2022-02-28 NOTE — Progress Notes (Signed)
Discharge instructions given. Patient verbalized understanding and all questions were answered.  

## 2022-02-28 NOTE — Discharge Summary (Signed)
Name: Rachel Nolan MRN: [id number] DOB: Dec 06, 1970 52 y.o. PCP: Rachel Juniper, MD  Date of Admission: 02/24/2022 12:15 PM Date of Discharge: 02/28/2022 Attending Physician: Angelica Pou, MD  Discharge Diagnosis: 1. Principal Problem:   COPD exacerbation ([place]) Active Problems:   Cocaine use disorder ([place])   Asthma   Chronic respiratory failure with hypoxia (HCC) - 2 L/min   Toxic inhalation injury   Housing situation unstable    Discharge Medications: Allergies as of 02/28/2022   No Known Allergies      Medication List     TAKE these medications    albuterol 108 (90 Base) MCG/ACT inhaler Commonly known as: VENTOLIN HFA Inhale 2 puffs into the lungs every 4 (four) hours as needed for shortness of breath or wheezing.   fluticasone furoate-vilanterol 200-25 MCG/ACT Aepb Commonly known as: Breo Ellipta Inhale 1 puff into the lungs daily. What changed:  how much to take when to take this   gabapentin 400 MG capsule Commonly known as: NEURONTIN Take 1 capsule (400 mg total) by mouth 2 (two) times daily as needed (For pain).   ipratropium-albuterol 0.5-2.5 (3) MG/3ML Soln Commonly known as: DUONEB Take 3 mLs by nebulization every 6 (six) hours as needed (Use FIRST).   montelukast 10 MG tablet Commonly known as: SINGULAIR Take 1 tablet (10 mg total) by mouth at bedtime.   QUEtiapine 100 MG tablet Commonly known as: SEROQUEL Take 1 tablet (100 mg total) by mouth at bedtime. What changed:  medication strength how much to take        Disposition and follow-up:   [name] was discharged from [facility] in Good condition.  At the hospital follow up visit please address:  1.    Hypoxic Respiratory Failure - Please ensure patient has access to oxygen and enough supply of inhalers   Polysubstance Abuse - She has been given multiple resources for cessation, please evaluate if she requires more   Essential  Hypertension - PT was normotensive, please evaluate if anti-hypertensive medication is necessary  2.  Labs / imaging needed at time of follow-up: CBC and BMP  3.  Pending labs/ test needing follow-up: NA  Follow-up Appointments:   Hospital Course by problem list:   #Acute on Chronic Hypoxic Respiratory Failure  #RLL Pneumonitis  #COPD Exacerbation  Pt presents with history of COPD on chronic 2L of oxygen at home after smoking crack cocaine. Chest X-Ray positive for RLL pneumonia/pneumonitis. She was given multiple breathing treatments, and eventually was able to be weaned down to her home dose of 2L. She did not desaturate below 88 on her baseline of 2L of oxygen while ambulating.  Was also started on azithromycin and prendsione, for which she has finished her course.  #Polysubstance Abuse Pt has very long history of using multiple substances such as marijuana, crack cocaine, and heroin. She states she has been trying to quit, however given her current social situation and living on the streets, when people offer she is susceptible and relapses. This has been discussed with her at her previous hospitalizations as well. Seems this could also have played a part in her acute symptoms, as she developed symptoms almost immediately after smoking crack last night. She has receivied multiple information packets on resources, and is determined to use them it seems.   #Anxiety  Pt has history of anxiety and has been on seroquel, continued at half dose from records. Will need outpatient follow up for further management.    #  Essential Hypertension  Blood pressure has been normotensive, and patient was not taking blood pressure medications at home. Will need outpatient management.    Discharge Subjective:  Pt seen at bedside this AM. Says she is doing well, and breathing has improved significantly. She has been ambulating well.    Discharge Exam:   BP (!) 147/91 (BP Location: Left Arm)   Pulse 94    Temp 98.4 F (36.9 C) (Axillary)   Resp (!) 22   Ht 5' 6"$  (1.676 m)   Wt 87.1 kg   LMP 04/19/2018   SpO2 97%   BMI 30.99 kg/m  Discharge exam:  Constitutional: Pleasant woman, in no acute distress HEENT: Poor, carious dentition,  no lesions or acute traumas to the mouth. Moist oral mucosa.  Cardiovascular: RRR, no murmurs, rubs or gallops Pulmonary/Chest: Subtle bilateral wheezes heard at lung bases, improved from prior.  Normal work of breathing Abdominal: soft, non-tender, non-distended Skin: warm and dry Extremities: upper/lower extremity pulses 2+, no lower extremity edema present   Pertinent Labs, Studies, and Procedures:     Latest Ref Rng & Units 02/28/2022    6:19 AM 02/27/2022    3:22 AM 02/26/2022    5:08 AM  CBC  WBC 4.0 - 10.5 K/uL 9.6  8.2  7.8   Hemoglobin 12.0 - 15.0 g/dL 11.3  12.1  12.4   Hematocrit 36.0 - 46.0 % 34.5  35.0  36.0   Platelets 150 - 400 K/uL 205  205  216        Latest Ref Rng & Units 02/28/2022    6:19 AM 02/27/2022    3:22 AM 02/26/2022    5:08 AM  BMP  Glucose 70 - 99 mg/dL 123  120  122   BUN 6 - 20 mg/dL 24  20  22   $ Creatinine 0.44 - 1.00 mg/dL 0.76  0.66  0.68   Sodium 135 - 145 mmol/L 139  138  138   Potassium 3.5 - 5.1 mmol/L 3.8  3.7  3.8   Chloride 98 - 111 mmol/L 104  104  101   CO2 22 - 32 mmol/L 24  23  29   $ Calcium 8.9 - 10.3 mg/dL 8.8  9.0  9.1      Discharge Instructions: Discharge Instructions     Call MD for:  persistant dizziness or light-headedness   Complete by: As directed    Call MD for:  persistant nausea and vomiting   Complete by: As directed    Call MD for:  redness, tenderness, or signs of infection (pain, swelling, redness, odor or green/yellow discharge around incision site)   Complete by: As directed    Call MD for:  severe uncontrolled pain   Complete by: As directed    Diet - low sodium heart healthy   Complete by: As directed    Increase activity slowly   Complete by: As directed         Signed: Drucie Opitz, MD 02/28/2022, 1:55 PM   Pager: [phone]

## 2022-03-01 ENCOUNTER — Telehealth: Payer: Self-pay

## 2022-03-01 ENCOUNTER — Other Ambulatory Visit: Payer: Self-pay

## 2022-03-01 NOTE — Transitions of Care (Post Inpatient/ED Visit) (Unsigned)
   03/01/2022  Name: Analyse Angst MRN: [id number] DOB: 1970-03-24  Today's TOC FU Call Status: Today's TOC FU Call Status:: Unsuccessul Call (1st Attempt) Unsuccessful Call (1st Attempt) Date: 03/01/22  Attempted to reach the patient regarding the most recent Inpatient/ED visit.  Follow Up Plan: {[id number]:29072}  Signature ***Juanda Crumble, [place] Direct Dial [phone]

## 2022-03-02 ENCOUNTER — Other Ambulatory Visit: Payer: Self-pay

## 2022-03-02 NOTE — Transitions of Care (Post Inpatient/ED Visit) (Unsigned)
   03/02/2022  Name: Rachel Nolan MRN: [id number] DOB: 1970/02/01  Today's TOC FU Call Status: Today's TOC FU Call Status:: Unsuccessful Call (2nd Attempt) Unsuccessful Call (1st Attempt) Date: 03/01/22 Unsuccessful Call (2nd Attempt) Date: 03/02/22  Attempted to reach the patient regarding the most recent Inpatient/ED visit.  Follow Up Plan: Additional outreach attempts will be made to reach the patient to complete the Transitions of Care (Post Inpatient/ED visit) call.   Signature  Juanda Crumble, [place] Direct Dial [phone]

## 2022-03-03 ENCOUNTER — Other Ambulatory Visit: Payer: Self-pay

## 2022-03-04 NOTE — Transitions of Care (Post Inpatient/ED Visit) (Signed)
   03/04/2022  Name: Rachel Nolan MRN: [id number] DOB: Jun 21, 1970  Today's TOC FU Call Status: Today's TOC FU Call Status:: Unsuccessful Call (3rd Attempt) Unsuccessful Call (1st Attempt) Date: 03/01/22 Unsuccessful Call (2nd Attempt) Date: 03/02/22 Unsuccessful Call (3rd Attempt) Date: 03/04/22  Attempted to reach the patient regarding the most recent Inpatient/ED visit.  Follow Up Plan: No further outreach attempts will be made at this time. We have been unable to contact the patient.  Signature Juanda Crumble, [place] Direct Dial [phone]

## 2022-03-05 ENCOUNTER — Other Ambulatory Visit: Payer: Self-pay

## 2022-03-05 ENCOUNTER — Ambulatory Visit (HOSPITAL_COMMUNITY)
Admission: EM | Admit: 2022-03-05 | Discharge: 2022-03-05 | Disposition: A | Discharge status: Home / Self Care | Payer: Medicaid Other | Attending: Internal Medicine | Admitting: Internal Medicine

## 2022-03-05 ENCOUNTER — Encounter (HOSPITAL_COMMUNITY): Payer: Self-pay

## 2022-03-05 DIAGNOSIS — Z978 Presence of other specified devices: Secondary | ICD-10-CM

## 2022-03-05 DIAGNOSIS — R238 Other skin changes: Secondary | ICD-10-CM | POA: Diagnosis not present

## 2022-03-05 MED ORDER — HYDROCORTISONE 1 % EX CREA
TOPICAL_CREAM | CUTANEOUS | 0 refills | Status: DC
  Filled 2022-03-05: qty 28, 14d supply, fill #0

## 2022-03-05 NOTE — Discharge Instructions (Signed)
I removed her IV today in the clinic and clean the area around it. I do not see any signs of infection to your arm. I would like for you to use hydrocortisone cream to the elbow twice daily for the next 5 to 7 days.  Do not use this longer than 7 days as this is a steroid medicine that can cause skin thinning if used for long periods of time.  Return to urgent care if you begin to see redness, swelling, warmth, or red lines going down or up your arm.   Follow-up with your primary care provider for ongoing management of symptoms.  If you develop any new or worsening symptoms or do not improve in the next 2 to 3 days, please return.  If your symptoms are severe, please go to the emergency room.  Follow-up with your primary care provider for further evaluation and management of your symptoms as well as ongoing wellness visits.  I hope you feel better!

## 2022-03-05 NOTE — ED Triage Notes (Signed)
Patient reports that she was discharged from [facility] 4 days ago. Patient has an IV in the [facility] and states she wants it removed.  Patient stated that she did not want the IV removed until seen by a doctor.Marland Kitchen

## 2022-03-05 NOTE — ED Provider Notes (Signed)
[place]    CSN: [id number] Arrival date & time: 03/05/22  0920      History   Chief Complaint No chief complaint on file.   HPI Rachel Nolan is a 52 y.o. female.   Patient presents urgent care for evaluation and removal of peripheral IV to the right antecubital space that was left inside of the patient's arm after she was discharged from the hospital 4 days ago at [facility].  Patient states that she was allowed to be discharged from the hospital with a IV in place because she did not want the nurse to take it out.  She states the nurse was being "rough and not nice" when taking off the EKG stickers and attempting to remove the tape from the IV so they let her go with IV still in place.  Patient states she is experiencing lots of itching surrounding the area of the IV.  She called the emergency room at [facility] after being discharged from the inpatient hospital and told them about the situation.  She was advised by emergency room staff to remove the IV herself at home.  She did not feel comfortable with this and now presents to urgent care for evaluation.  Denies previous allergies to plastic tape.  She states she is feeling much better respiratory wise and continues to use her oxygen as directed.  She would like the IV to be removed today.  She states she has not put anything through the IV and has not noticed any redness or swelling to the upper arm or the forearm distal to the IV.  Full sensation to the right upper extremity and right hand.  She also mentions that it has been about 1 or 2 days since she has smoked crack cocaine.     Past Medical History:  Diagnosis Date   Anxiety    Asthma    COPD (chronic obstructive pulmonary disease) ([place])    Depression    Hypertension    Scoliosis     Patient Active Problem List   Diagnosis Date Noted   Toxic inhalation injury 02/25/2022   Housing situation unstable 02/25/2022   COPD exacerbation ([place]) 02/24/2022    Encounter for screening involving social determinants of health (SDoH) 12/21/2021   Endometrial thickening on ultrasound 12/15/2021   Acute on chronic respiratory failure with hypoxemia ([place]) 12/12/2021   Sepsis ([place]) 12/12/2021   CAP (community acquired pneumonia) 12/12/2021   Hypokalemia 12/12/2021   Hyperglycemia 12/12/2021   COPD with acute exacerbation ([place]) 09/20/2021   Polysubstance use disorder    Asthma exacerbation 05/17/2021   OSA (obstructive sleep apnea) 05/17/2021   Marijuana user 05/17/2021   Chronic respiratory failure with hypoxia ([place]) - 2 L/min 04/20/2021   History of substance abuse ([place]) 04/20/2021   Nicotine dependence, cigarettes, uncomplicated [id number]   Prolonged Q-T interval on ECG 09/26/2020   Mild asthma exacerbation 09/24/2020   Tobacco abuse 09/24/2020   Abnormal weight loss 09/24/2020   Asthma 09/23/2020   Homeless 08/22/2020   Cocaine abuse, continuous ([place]) 07/09/2020   Opioid use disorder, moderate, dependence ([place]) 05/16/2020   Adjustment disorder with depressed mood 04/24/2020   Cocaine-induced mood disorder ([place]) 04/22/2020   Essential hypertension 03/26/2020   Neuropathic pain 03/26/2020   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/25/2020   Major depressive disorder with psychotic features ([place]) 01/24/2020   Stimulant use disorder 01/24/2020   Alcohol use disorder, severe, dependence ([place]) 12/06/2019   Cocaine use  disorder ([place]) 03/05/2019   Major depressive disorder, recurrent episode, severe ([place]) 03/02/2019   Post traumatic stress disorder (PTSD) 03/02/2019   Anxiety and depression 03/02/2019   Substance induced mood disorder ([place]) 03/02/2019   MDD (major depressive disorder) 03/01/2019    Past Surgical History:  Procedure Laterality Date   BACK SURGERY     ECTOPIC PREGNANCY SURGERY      OB History   No obstetric history on file.      Home Medications    Prior to Admission medications    Medication Sig Start Date End Date Taking? Authorizing Provider  hydrocortisone cream 1 % Apply to affected area 2 times daily 03/05/22  Yes Ravon Mortellaro, Stasia Cavalier, FNP  albuterol (VENTOLIN HFA) 108 (90 Base) MCG/ACT inhaler Inhale 2 puffs into the lungs every 4 (four) hours as needed for shortness of breath or wheezing. 12/15/21   Dahal, Marlowe Aschoff, MD  fluticasone furoate-vilanterol (BREO ELLIPTA) 200-25 MCG/ACT AEPB Inhale 1 puff into the lungs daily. 02/28/22   Nooruddin, Marlene Lard, MD  gabapentin (NEURONTIN) 400 MG capsule Take 1 capsule (400 mg total) by mouth 2 (two) times daily as needed (For pain). 02/28/22   Nooruddin, Marlene Lard, MD  ipratropium-albuterol (DUONEB) 0.5-2.5 (3) MG/3ML SOLN Take 3 mLs by nebulization every 6 (six) hours as needed (Use FIRST). 12/15/21 01/14/22  Dahal, Marlowe Aschoff, MD  montelukast (SINGULAIR) 10 MG tablet Take 1 tablet (10 mg total) by mouth at bedtime. 12/15/21   Terrilee Croak, MD  QUEtiapine (SEROQUEL) 100 MG tablet Take 1 tablet (100 mg total) by mouth at bedtime. 02/28/22   Nooruddin, Marlene Lard, MD  citalopram (CELEXA) 20 MG tablet Take 1 tablet (20 mg total) by mouth daily. 03/07/19 07/23/19  Connye Burkitt, NP  lisinopril (ZESTRIL) 10 MG tablet Take 1 tablet (10 mg total) by mouth daily. 03/07/19 09/11/19  Connye Burkitt, NP  mometasone-formoterol (DULERA) 200-5 MCG/ACT AERO Inhale 2 puffs into the lungs 2 (two) times daily. Patient not taking: Reported on 12/12/2021 07/09/21 12/15/21  Hosie Poisson, MD  pantoprazole (PROTONIX) 40 MG tablet Take 1 tablet (40 mg total) by mouth daily. 03/07/19 07/23/19  Connye Burkitt, NP    Family History Family History  Problem Relation Age of Onset   Hypertension Mother    Hypertension Father     Social History Social History   Tobacco Use   Smoking status: Every Day    Packs/day: 0.50    Types: Cigarettes   Smokeless tobacco: Never  Vaping Use   Vaping Use: Never used  Substance Use Topics   Alcohol use: Yes   Drug use: Yes    Types:  Marijuana, Cocaine, "Crack" cocaine     Allergies   Patient has no known allergies.   Review of Systems Review of Systems Per HPI  Physical Exam Triage Vital Signs ED Triage Vitals  Enc Vitals Group     BP 03/05/22 0956 (!) 147/101     Pulse Rate 03/05/22 0956 85     Resp 03/05/22 0956 18     Temp 03/05/22 0956 98 F (36.7 C)     Temp Source 03/05/22 0956 Oral     SpO2 03/05/22 0956 97 %     Weight --      Height --      Head Circumference --      Peak Flow --      Pain Score 03/05/22 0958 7     Pain Loc --      Pain Edu? --  Excl. in GC? --    No data found.  Updated Vital Signs BP (!) 147/101 (BP Location: Left Arm)   Pulse 85   Temp 98 F (36.7 C) (Oral)   Resp 18   LMP 04/19/2018   SpO2 97%   Visual Acuity Right Eye Distance:   Left Eye Distance:   Bilateral Distance:    Right Eye Near:   Left Eye Near:    Bilateral Near:     Physical Exam Vitals and nursing note reviewed.  Constitutional:      Appearance: She is not ill-appearing or toxic-appearing.  HENT:     Head: Normocephalic and atraumatic.     Right Ear: Hearing and external ear normal.     Left Ear: Hearing and external ear normal.     Nose: Nose normal.     Mouth/Throat:     Lips: Pink.  Eyes:     General: Lids are normal. Vision grossly intact. Gaze aligned appropriately.     Extraocular Movements: Extraocular movements intact.     Conjunctiva/sclera: Conjunctivae normal.  Cardiovascular:     Rate and Rhythm: Normal rate and regular rhythm.     Heart sounds: Normal heart sounds, S1 normal and S2 normal.  Pulmonary:     Effort: Pulmonary effort is normal. No respiratory distress.     Breath sounds: Normal breath sounds and air entry.  Musculoskeletal:     Cervical back: Neck supple.  Skin:    General: Skin is warm and dry.     Capillary Refill: Capillary refill takes less than 2 seconds.     Findings: No rash.     Comments: IV in place to the right antecubital area as  seen in image below.  Dry skin and signs of excoriation due to itching to the surrounding area of the IV.  No redness, swelling, purulent drainage, or decreased range of motion to the right elbow.  Neurological:     General: No focal deficit present.     Mental Status: She is alert and oriented to person, place, and time. Mental status is at baseline.     Cranial Nerves: No dysarthria or facial asymmetry.  Psychiatric:        Mood and Affect: Mood normal.        Speech: Speech normal.        Behavior: Behavior normal.        Thought Content: Thought content normal.        Judgment: Judgment normal.      UC Treatments / Results  Labs (all labs ordered are listed, but only abnormal results are displayed) Labs Reviewed - No data to display  EKG   Radiology No results found.  Procedures Procedures (including critical care time)  Medications Ordered in UC Medications - No data to display  Initial Impression / Assessment and Plan / UC Course  I have reviewed the triage vital signs and the nursing notes.  Pertinent labs & imaging results that were available during my care of the patient were reviewed by me and considered in my medical decision making (see chart for details).   1.  Skin irritation, IV catheter in place After I assessed the site, patient allowed me to remove the IV.  No evidence of phlebitis or lymphangitis.  Site of the IV has been cleansed and dressed with gauze and tape in the clinic.  Advised to keep the area clean and dry.  Surrounding area of skin to the IV site  is dry and itchy likely due to prolonged use of tape to the skin.  May use hydrocortisone cream twice daily to the area for the next 5 to 7 days to reduce inflammation to the area related to recent tape to the skin and IV.  Advised to avoid using this medication for greater than 5 to 7 days as it is a steroid.  PCP follow-up recommended should she experience any new or worsening signs of infection.  She is  agreeable to this plan. Discussed cessation of drug use, she is not interested in stopping use of illicit substances at this time, but has been advised to seek help with cessation of these substances when she is ready.  Discussed physical exam and available lab work findings in clinic with patient.  Counseled patient regarding appropriate use of medications and potential side effects for all medications recommended or prescribed today. Discussed red flag signs and symptoms of worsening condition,when to call the PCP office, return to urgent care, and when to seek higher level of care in the emergency department. Patient verbalizes understanding and agreement with plan. All questions answered. Patient discharged in stable condition.    Final Clinical Impressions(s) / UC Diagnoses   Final diagnoses:  Skin irritation  Intravenous catheter in place     Discharge Instructions      I removed her IV today in the clinic and clean the area around it. I do not see any signs of infection to your arm. I would like for you to use hydrocortisone cream to the elbow twice daily for the next 5 to 7 days.  Do not use this longer than 7 days as this is a steroid medicine that can cause skin thinning if used for long periods of time.  Return to urgent care if you begin to see redness, swelling, warmth, or red lines going down or up your arm.   Follow-up with your primary care provider for ongoing management of symptoms.  If you develop any new or worsening symptoms or do not improve in the next 2 to 3 days, please return.  If your symptoms are severe, please go to the emergency room.  Follow-up with your primary care provider for further evaluation and management of your symptoms as well as ongoing wellness visits.  I hope you feel better!     ED Prescriptions     Medication Sig Dispense Auth. Provider   hydrocortisone cream 1 % Apply to affected area 2 times daily 15 g Talbot Grumbling, FNP       PDMP not reviewed this encounter.   Talbot Grumbling, [place] 03/07/22 2048

## 2022-03-08 NOTE — Progress Notes (Signed)
- 

## 2022-03-09 ENCOUNTER — Other Ambulatory Visit: Payer: Self-pay

## 2022-03-09 NOTE — Progress Notes (Deleted)
2/07 discharge hospital: crack cocaine use with RLL pneumonia  HTN Previously on Lisinopril and amlodipine 43m. Was normotensive during hospitalization without meds, however patient was hypertensive during clinic.  Suspect coughing and walking from parking lot was contributory.  Patient has not been taking medications for the past few months as she was not able to afford them.  She is currently asymptomatic from the hypertension standpoint. -Will resume lisinopril and amlodipine as soon as patient can get medication assistance -BP log -Precautionary symptoms to be seen and evaluated by a provider were reviewed -Not on meds at hospital discharge  COPD History of multiple COPD exacerbations, last one in 11/2021 for which patient was hospitalized for sepsis 2/2 R basilar pneumonia. WBC count elevated to 15.3. Treated with rocephin, azithromycin, and prednisone. Patient was discharged with 3L O2 requirement, prednisone taper 671mand completion of omnicef course for 3 days. Patient is back to her 2L O2 requirement. Current meds: Ventolin HFAPRN,  Sigulair 10 mg, Breo-Ellipta (ICS, LABA), Ipratropium-albuterol duonebs twice daily. Coughing is non productive and has improved since discharge. Patient has also decreased the number of cigarettes used daily. Patient has had multiple exacerbations in the last year; she would benefit from ICS/LAMA/LABA therapy as opposed to doing duonebs only. Patient is currently limited as she is not able to pay for medications.  -Patient is to continue current regimen with Singulair, Breo Ellipta, DuoNebs scheduled TID, as needed albuterol -Switch to Trelegy once BrKelloggnd DuoNebs run out -Will reach out to our pharmacy coordinator for medication assistance -Instructed patient to apply for Medicaid and disability coverage; once that is available we will also send patient for PFTs  Asthma Montelukast 10 daily Duoneb q6 prn Fluticasone vilanterol(breo ellipta) 1 puff  daily-> triple therapy?? Albuterol 2 puffs q4 prn  Substance induced mood disorderMDD? Anxiety Hx passive SI -quetiepine 100 daily  Housing instability  HCM Pap,colon,mammo

## 2022-03-11 ENCOUNTER — Other Ambulatory Visit: Payer: Self-pay

## 2022-03-12 ENCOUNTER — Encounter: Payer: Medicaid Other | Admitting: Student

## 2022-03-29 ENCOUNTER — Telehealth: Payer: Self-pay

## 2022-03-29 NOTE — Progress Notes (Signed)
Patient attempted to be outreached by Darrall Dears, PharmD Candidate on 03/29/2022 to discuss hypertension. Left voicemail for patient to return our call at their convenience at [phone].   Darrall Dears, PharmD Candidate   Joseph Art, Pharm.D. PGY-2 Ambulatory Care Pharmacy Resident

## 2022-05-23 ENCOUNTER — Emergency Department (HOSPITAL_COMMUNITY): Payer: Medicaid Other

## 2022-05-23 ENCOUNTER — Inpatient Hospital Stay (HOSPITAL_COMMUNITY)
Admission: EM | Admit: 2022-05-23 | Discharge: 2022-05-26 | DRG: 189 | Disposition: A | Discharge status: Home / Self Care | Payer: Medicaid Other | Attending: Internal Medicine | Admitting: Internal Medicine

## 2022-05-23 ENCOUNTER — Observation Stay (HOSPITAL_COMMUNITY): Payer: Medicaid Other

## 2022-05-23 ENCOUNTER — Encounter (HOSPITAL_COMMUNITY): Payer: Self-pay

## 2022-05-23 DIAGNOSIS — M419 Scoliosis, unspecified: Secondary | ICD-10-CM | POA: Diagnosis present

## 2022-05-23 DIAGNOSIS — Z5900 Homelessness unspecified: Secondary | ICD-10-CM

## 2022-05-23 DIAGNOSIS — Z7951 Long term (current) use of inhaled steroids: Secondary | ICD-10-CM

## 2022-05-23 DIAGNOSIS — A419 Sepsis, unspecified organism: Secondary | ICD-10-CM

## 2022-05-23 DIAGNOSIS — Z79899 Other long term (current) drug therapy: Secondary | ICD-10-CM

## 2022-05-23 DIAGNOSIS — G4733 Obstructive sleep apnea (adult) (pediatric): Secondary | ICD-10-CM | POA: Diagnosis present

## 2022-05-23 DIAGNOSIS — F17210 Nicotine dependence, cigarettes, uncomplicated: Secondary | ICD-10-CM | POA: Diagnosis present

## 2022-05-23 DIAGNOSIS — Z8249 Family history of ischemic heart disease and other diseases of the circulatory system: Secondary | ICD-10-CM

## 2022-05-23 DIAGNOSIS — Z66 Do not resuscitate: Secondary | ICD-10-CM | POA: Diagnosis present

## 2022-05-23 DIAGNOSIS — J9601 Acute respiratory failure with hypoxia: Principal | ICD-10-CM | POA: Diagnosis present

## 2022-05-23 DIAGNOSIS — E876 Hypokalemia: Secondary | ICD-10-CM | POA: Diagnosis present

## 2022-05-23 DIAGNOSIS — J440 Chronic obstructive pulmonary disease with acute lower respiratory infection: Secondary | ICD-10-CM | POA: Diagnosis present

## 2022-05-23 DIAGNOSIS — F1410 Cocaine abuse, uncomplicated: Secondary | ICD-10-CM | POA: Diagnosis present

## 2022-05-23 DIAGNOSIS — Z9981 Dependence on supplemental oxygen: Secondary | ICD-10-CM

## 2022-05-23 DIAGNOSIS — J189 Pneumonia, unspecified organism: Secondary | ICD-10-CM | POA: Diagnosis present

## 2022-05-23 DIAGNOSIS — Z1152 Encounter for screening for COVID-19: Secondary | ICD-10-CM

## 2022-05-23 DIAGNOSIS — F329 Major depressive disorder, single episode, unspecified: Secondary | ICD-10-CM | POA: Diagnosis present

## 2022-05-23 DIAGNOSIS — J9621 Acute and chronic respiratory failure with hypoxia: Secondary | ICD-10-CM | POA: Diagnosis present

## 2022-05-23 DIAGNOSIS — J441 Chronic obstructive pulmonary disease with (acute) exacerbation: Principal | ICD-10-CM | POA: Diagnosis present

## 2022-05-23 DIAGNOSIS — I10 Essential (primary) hypertension: Secondary | ICD-10-CM | POA: Diagnosis present

## 2022-05-23 LAB — LACTIC ACID, PLASMA
Lactic Acid, Venous: 1 mmol/L (ref 0.5–1.9)
Lactic Acid, Venous: 0.9 mmol/L (ref 0.5–1.9)

## 2022-05-23 LAB — CBC WITH DIFFERENTIAL/PLATELET
Abs Immature Granulocytes: 0.05 10*3/uL (ref 0.00–0.07)
Basophils Absolute: 0 10*3/uL (ref 0.0–0.1)
Basophils Relative: 0 %
Eosinophils Absolute: 0.1 10*3/uL (ref 0.0–0.5)
Eosinophils Relative: 1 %
HCT: 34.5 % — ABNORMAL LOW (ref 36.0–46.0)
Hemoglobin: 11.6 g/dL — ABNORMAL LOW (ref 12.0–15.0)
Immature Granulocytes: 0 %
Lymphocytes Relative: 25 %
Lymphs Abs: 3.2 10*3/uL (ref 0.7–4.0)
MCH: 31.4 pg (ref 26.0–34.0)
MCHC: 33.6 g/dL (ref 30.0–36.0)
MCV: 93.5 fL (ref 80.0–100.0)
Monocytes Absolute: 1.6 10*3/uL — ABNORMAL HIGH (ref 0.1–1.0)
Monocytes Relative: 12 %
Neutro Abs: 7.9 10*3/uL — ABNORMAL HIGH (ref 1.7–7.7)
Neutrophils Relative %: 62 %
Platelets: 254 10*3/uL (ref 150–400)
RBC: 3.69 MIL/uL — ABNORMAL LOW (ref 3.87–5.11)
RDW: 14.9 % (ref 11.5–15.5)
WBC: 12.8 10*3/uL — ABNORMAL HIGH (ref 4.0–10.5)
nRBC: 0 % (ref 0.0–0.2)

## 2022-05-23 LAB — COMPREHENSIVE METABOLIC PANEL
ALT: 12 U/L (ref 0–44)
AST: 17 U/L (ref 15–41)
Albumin: 3.1 g/dL — ABNORMAL LOW (ref 3.5–5.0)
Alkaline Phosphatase: 74 U/L (ref 38–126)
Anion gap: 9 (ref 5–15)
BUN: 12 mg/dL (ref 6–20)
CO2: 27 mmol/L (ref 22–32)
Calcium: 9 mg/dL (ref 8.9–10.3)
Chloride: 104 mmol/L (ref 98–111)
Creatinine, Ser: 0.77 mg/dL (ref 0.44–1.00)
GFR, Estimated: >60 mL/min (ref >60)
Glucose, Bld: 122 mg/dL — ABNORMAL HIGH (ref 70–99)
Potassium: 3.6 mmol/L (ref 3.5–5.1)
Sodium: 140 mmol/L (ref 135–145)
Total Bilirubin: 0.5 mg/dL (ref 0.3–1.2)
Total Protein: 6.6 g/dL (ref 6.5–8.1)

## 2022-05-23 LAB — I-STAT VENOUS BLOOD GAS, ED
Acid-Base Excess: 6 mmol/L — ABNORMAL HIGH (ref 0.0–2.0)
Bicarbonate: 31 mmol/L — ABNORMAL HIGH (ref 20.0–28.0)
Calcium, Ion: 1.17 mmol/L (ref 1.15–1.40)
HCT: 36 % (ref 36.0–46.0)
Hemoglobin: 12.2 g/dL (ref 12.0–15.0)
O2 Saturation: 95 %
Potassium: 3.6 mmol/L (ref 3.5–5.1)
Sodium: 141 mmol/L (ref 135–145)
TCO2: 32 mmol/L (ref 22–32)
pCO2, Ven: 45.8 mmHg (ref 44–60)
pH, Ven: 7.439 — ABNORMAL HIGH (ref 7.25–7.43)
pO2, Ven: 71 mmHg — ABNORMAL HIGH (ref 32–45)

## 2022-05-23 LAB — TROPONIN I (HIGH SENSITIVITY)
Troponin I (High Sensitivity): 7 ng/L (ref <18)
Troponin I (High Sensitivity): 6 ng/L (ref <18)

## 2022-05-23 LAB — PROTIME-INR
INR: 1.1 (ref 0.8–1.2)
Prothrombin Time: 14.1 seconds (ref 11.4–15.2)

## 2022-05-23 LAB — APTT: aPTT: 30 seconds (ref 24–36)

## 2022-05-23 LAB — SARS CORONAVIRUS 2 BY RT PCR: SARS Coronavirus 2 by RT PCR: NEGATIVE

## 2022-05-23 MED ORDER — SODIUM CHLORIDE 0.9 % IV SOLN
500.0000 mg | INTRAVENOUS | Status: DC
  Administered 2022-05-23: 500 mg via INTRAVENOUS
  Filled 2022-05-23: qty 5

## 2022-05-23 MED ORDER — IPRATROPIUM BROMIDE 0.02 % IN SOLN
RESPIRATORY_TRACT | Status: AC
  Filled 2022-05-23: qty 2.5

## 2022-05-23 MED ORDER — MONTELUKAST SODIUM 10 MG PO TABS
10.0000 mg | ORAL_TABLET | Freq: Every day | ORAL | Status: DC
  Administered 2022-05-23 – 2022-05-25 (×3): 10 mg via ORAL
  Filled 2022-05-23 (×3): qty 1

## 2022-05-23 MED ORDER — ARFORMOTEROL TARTRATE 15 MCG/2ML IN NEBU
15.0000 ug | INHALATION_SOLUTION | Freq: Two times a day (BID) | RESPIRATORY_TRACT | Status: DC
  Administered 2022-05-23 – 2022-05-26 (×6): 15 ug via RESPIRATORY_TRACT
  Filled 2022-05-23 (×6): qty 2

## 2022-05-23 MED ORDER — IPRATROPIUM-ALBUTEROL 0.5-2.5 (3) MG/3ML IN SOLN
3.0000 mL | RESPIRATORY_TRACT | Status: DC
  Administered 2022-05-23 – 2022-05-26 (×16): 3 mL via RESPIRATORY_TRACT
  Filled 2022-05-23 (×17): qty 3

## 2022-05-23 MED ORDER — REVEFENACIN 175 MCG/3ML IN SOLN
175.0000 ug | Freq: Every day | RESPIRATORY_TRACT | Status: DC
  Administered 2022-05-24 – 2022-05-26 (×3): 175 ug via RESPIRATORY_TRACT
  Filled 2022-05-23 (×4): qty 3

## 2022-05-23 MED ORDER — PREDNISONE 20 MG PO TABS
40.0000 mg | ORAL_TABLET | Freq: Every day | ORAL | Status: DC
  Administered 2022-05-24 – 2022-05-26 (×3): 40 mg via ORAL
  Filled 2022-05-23 (×3): qty 2

## 2022-05-23 MED ORDER — LACTATED RINGERS IV SOLN: INTRAVENOUS | Status: DC

## 2022-05-23 MED ORDER — AZITHROMYCIN 500 MG PO TABS: 500.0000 mg | ORAL_TABLET | Freq: Every day | ORAL | Status: DC

## 2022-05-23 MED ORDER — BUDESONIDE 0.25 MG/2ML IN SUSP
0.2500 mg | Freq: Two times a day (BID) | RESPIRATORY_TRACT | Status: DC
  Administered 2022-05-23 – 2022-05-26 (×6): 0.25 mg via RESPIRATORY_TRACT
  Filled 2022-05-23 (×6): qty 2

## 2022-05-23 MED ORDER — SODIUM CHLORIDE 0.9 % IV SOLN
2.0000 g | INTRAVENOUS | Status: DC
  Administered 2022-05-23 – 2022-05-26 (×4): 2 g via INTRAVENOUS
  Filled 2022-05-23 (×4): qty 20

## 2022-05-23 MED ORDER — ALBUTEROL SULFATE (2.5 MG/3ML) 0.083% IN NEBU
INHALATION_SOLUTION | RESPIRATORY_TRACT | Status: AC
  Filled 2022-05-23: qty 12

## 2022-05-23 MED ORDER — GABAPENTIN 400 MG PO CAPS
400.0000 mg | ORAL_CAPSULE | Freq: Two times a day (BID) | ORAL | Status: DC | PRN
  Administered 2022-05-24 – 2022-05-25 (×4): 400 mg via ORAL
  Filled 2022-05-23 (×4): qty 1

## 2022-05-23 MED ORDER — QUETIAPINE FUMARATE 100 MG PO TABS
100.0000 mg | ORAL_TABLET | Freq: Every day | ORAL | Status: DC
  Administered 2022-05-23 – 2022-05-25 (×3): 100 mg via ORAL
  Filled 2022-05-23 (×3): qty 1

## 2022-05-23 MED ORDER — ENOXAPARIN SODIUM 40 MG/0.4ML IJ SOSY
40.0000 mg | PREFILLED_SYRINGE | INTRAMUSCULAR | Status: DC
  Administered 2022-05-23 – 2022-05-24 (×2): 40 mg via SUBCUTANEOUS
  Filled 2022-05-23 (×3): qty 0.4

## 2022-05-23 NOTE — ED Triage Notes (Signed)
Pt bib [facility] from home.  Hx of COPD, exacerbation approx 1 hr pta, lungs diminished, wheezing, O2 sat, 2 duonebs, cpap, 2g mag sulfate, 125 solumedrol, 3mg  of epi, 140/90, pulse 110, 22 in right hand.

## 2022-05-23 NOTE — ED Notes (Signed)
ED TO INPATIENT HANDOFF REPORT  ED Nurse Name and Phone #: [place] RN [phone]  Pt initially on bipap, now on [place], can use bedside commode but will desat/become short of breath with ambulation. Alert and oriented.  From provider note:  52 year old female presents emergency department with complaints of shortness of breath.  Patient states the symptoms worsened abruptly today.  Patient called EMS due to difficulty breathing who gave her 2 DuoNeb's, CPAP, 2 g of magnesium sulfate, 125 of Solu-Medrol as well as 3 mg of epinephrine prior to coming to the emergency department.  Patient on BiPAP initially.  Complaining of shortness of breath as well as cough.  Denies fever but does report some chest tightness.  Denies abdominal pain, nausea, vomiting, urinary symptoms, change in bowel habits.  Patient does report compliance with her home medications.  She states that she regularly smokes cocaine with most recent usage last night.   S Name/Age/Gender Rachel Nolan 52 y.o. female Room/Bed: 030C/030C  Code Status   Code Status: DNR  Home/SNF/Other Home Patient oriented to: self, place, time, and situation Is this baseline? Yes   Triage Complete: Triage complete  Chief Complaint Acute hypoxic respiratory failure (HCC) [J96.01]  Triage Note Pt bib [facility] from home.  Hx of COPD, exacerbation approx 1 hr pta, lungs diminished, wheezing, O2 sat, 2 duonebs, cpap, 2g mag sulfate, 125 solumedrol, 3mg  of epi, 140/90, pulse 110, 22 in right hand.   Allergies No Known Allergies  Level of Care/Admitting Diagnosis ED Disposition     ED Disposition  Admit   Condition  --   Comment  Hospital Area: [facility] [[id number]]  Level of Care: Progressive [102]  Admit to Progressive based on following criteria: RESPIRATORY PROBLEMS hypoxemic/hypercapnic respiratory failure that is responsive to NIPPV (BiPAP) or High Flow Nasal Cannula (6-80 lpm). Frequent assessment/intervention, no > Q2 hrs  < Q4 hrs, to maintain oxygenation and pulmonary hygiene.  May place patient in observation at [facility] or [facility] if equivalent level of care is available:: No  Covid Evaluation: Asymptomatic - no recent exposure (last 10 days) testing not required  Diagnosis: Acute hypoxic respiratory failure [facility]) [[id number]]  Admitting Physician: Silvio Pate  Attending Physician: Gust Rung [[id number]]          B Medical/Surgery History Past Medical History:  Diagnosis Date   Anxiety    Asthma    COPD (chronic obstructive pulmonary disease) (HCC)    Depression    Hypertension    Scoliosis    Past Surgical History:  Procedure Laterality Date   BACK SURGERY     ECTOPIC PREGNANCY SURGERY       A IV Location/Drains/Wounds Patient Lines/Drains/Airways Status     Active Line/Drains/Airways     Name Placement date Placement time Site Days   Peripheral IV 05/23/22 20 G 1" Posterior;Right Hand 05/23/22  0943  Hand  less than 1   Peripheral IV 22 G 1" Left;Posterior Hand --  --  Hand  --   Peripheral IV 05/23/22 22 G 1" Anterior;Left Forearm 05/23/22  1059  Forearm  less than 1            Intake/Output Last 24 hours  Intake/Output Summary (Last 24 hours) at 05/23/2022 1524 Last data filed at 05/23/2022 1359 Gross per 24 hour  Intake 350 ml  Output --  Net 350 ml    Labs/Imaging Results for orders placed or performed during the hospital encounter of 05/23/22 (from the  past 48 hour(s))  Comprehensive metabolic panel     Status: Abnormal   Collection Time: 05/23/22  9:41 AM  Result Value Ref Range   Sodium 140 135 - 145 mmol/L   Potassium 3.6 3.5 - 5.1 mmol/L   Chloride 104 98 - 111 mmol/L   CO2 27 22 - 32 mmol/L   Glucose, Bld 122 (H) 70 - 99 mg/dL    Comment: Glucose reference range applies only to samples taken after fasting for at least 8 hours.   BUN 12 6 - 20 mg/dL   Creatinine, Ser 1.61 0.44 - 1.00 mg/dL   Calcium 9.0 8.9 - 09.6 mg/dL   Total Protein 6.6  6.5 - 8.1 g/dL   Albumin 3.1 (L) 3.5 - 5.0 g/dL   AST 17 15 - 41 U/L   ALT 12 0 - 44 U/L   Alkaline Phosphatase 74 38 - 126 U/L   Total Bilirubin 0.5 0.3 - 1.2 mg/dL   GFR, Estimated >04 >54 mL/min    Comment: (NOTE) Calculated using the CKD-EPI Creatinine Equation (2021)    Anion gap 9 5 - 15    Comment: Performed at [facility] Lab, [address], [place], [place] [address]  CBC with Differential     Status: Abnormal   Collection Time: 05/23/22  9:41 AM  Result Value Ref Range   WBC 12.8 (H) 4.0 - 10.5 K/uL   RBC 3.69 (L) 3.87 - 5.11 MIL/uL   Hemoglobin 11.6 (L) 12.0 - 15.0 g/dL   HCT 91.4 (L) 78.2 - 95.6 %   MCV 93.5 80.0 - 100.0 fL   MCH 31.4 26.0 - 34.0 pg   MCHC 33.6 30.0 - 36.0 g/dL   RDW 21.3 08.6 - 57.8 %   Platelets 254 150 - 400 K/uL   nRBC 0.0 0.0 - 0.2 %   Neutrophils Relative % 62 %   Neutro Abs 7.9 (H) 1.7 - 7.7 K/uL   Lymphocytes Relative 25 %   Lymphs Abs 3.2 0.7 - 4.0 K/uL   Monocytes Relative 12 %   Monocytes Absolute 1.6 (H) 0.1 - 1.0 K/uL   Eosinophils Relative 1 %   Eosinophils Absolute 0.1 0.0 - 0.5 K/uL   Basophils Relative 0 %   Basophils Absolute 0.0 0.0 - 0.1 K/uL   Immature Granulocytes 0 %   Abs Immature Granulocytes 0.05 0.00 - 0.07 K/uL    Comment: Performed at [facility] Lab, [address], [place], [place] [address]  Troponin I (High Sensitivity)     Status: None   Collection Time: 05/23/22  9:41 AM  Result Value Ref Range   Troponin I (High Sensitivity) 7 <18 ng/L    Comment: (NOTE) Elevated high sensitivity troponin I (hsTnI) values and significant  changes across serial measurements may suggest ACS but many other  chronic and acute conditions are known to elevate hsTnI results.  Refer to the "Links" section for chest pain algorithms and additional  guidance. Performed at [facility], [address], [place], [place] [address]   Protime-INR     Status: None   Collection Time: 05/23/22  9:41 AM  Result Value Ref  Range   Prothrombin Time 14.1 11.4 - 15.2 seconds   INR 1.1 0.8 - 1.2    Comment: (NOTE) INR goal varies based on device and disease states. Performed at [facility], [address], [place], [place] [address]   APTT     Status: None  Collection Time: 05/23/22  9:41 AM  Result Value Ref Range   aPTT 30 24 - 36 seconds    Comment: Performed at [facility], [address], [place], [place] [address]  I-Stat venous blood gas, [facility] ED, [facility], [facility])     Status: Abnormal   Collection Time: 05/23/22  9:52 AM  Result Value Ref Range   pH, Ven 7.439 (H) 7.25 - 7.43   pCO2, Ven 45.8 44 - 60 mmHg   pO2, Ven 71 (H) 32 - 45 mmHg   Bicarbonate 31.0 (H) 20.0 - 28.0 mmol/L   TCO2 32 22 - 32 mmol/L   O2 Saturation 95 %   Acid-Base Excess 6.0 (H) 0.0 - 2.0 mmol/L   Sodium 141 135 - 145 mmol/L   Potassium 3.6 3.5 - 5.1 mmol/L   Calcium, Ion 1.17 1.15 - 1.40 mmol/L   HCT 36.0 36.0 - 46.0 %   Hemoglobin 12.2 12.0 - 15.0 g/dL   Sample type VENOUS   SARS Coronavirus 2 by RT PCR (hospital order, performed in [facility] hospital lab) *cepheid single result test*     Status: None   Collection Time: 05/23/22 10:57 AM   Specimen: Nasal Swab  Result Value Ref Range   SARS Coronavirus 2 by RT PCR NEGATIVE NEGATIVE    Comment: Performed at [facility] Lab, [address], [place], [place] [address]  Lactic acid, plasma     Status: None   Collection Time: 05/23/22 11:04 AM  Result Value Ref Range   Lactic Acid, Venous 1.0 0.5 - 1.9 mmol/L    Comment: Performed at [facility] Lab, [address] [place], [place] [address]  Lactic acid, plasma     Status: None   Collection Time: 05/23/22 12:19 PM  Result Value Ref Range   Lactic Acid, Venous 0.9 0.5 - 1.9 mmol/L    Comment: Performed at [facility] Lab, [address], [place], [place] [address]  Troponin I (High Sensitivity)     Status: None   Collection Time: 05/23/22 12:19 PM  Result Value Ref Range   Troponin I (High  Sensitivity) 6 <18 ng/L    Comment: (NOTE) Elevated high sensitivity troponin I (hsTnI) values and significant  changes across serial measurements may suggest ACS but many other  chronic and acute conditions are known to elevate hsTnI results.  Refer to the "Links" section for chest pain algorithms and additional  guidance. Performed at [facility] Lab, [address], [place], [place] [address]    DG Chest Port 1 View  Result Date: 05/23/2022 CLINICAL DATA:  Shortness of breath EXAM: PORTABLE CHEST 1 VIEW COMPARISON:  02/24/2022 FINDINGS: Heart size is normal. Chronic scoliosis with Harrington rod. Left lung is clear. Suspicion of right lower lobe infiltrate. Consider two-view examination for confirmation. No visible effusion. IMPRESSION: Suspicion of right lower lobe infiltrate. Consider two-view examination for confirmation. Electronically Signed   By: Paulina Fusi M.D.   On: 05/23/2022 10:02    Pending Labs Unresulted Labs (From admission, onward)     Start     Ordered   05/24/22 0500  HIV Antibody (routine testing w rflx)  (HIV Antibody (Routine testing w reflex) panel)  Tomorrow morning,   R        05/23/22 1350   05/24/22 0500  Basic metabolic panel  Tomorrow morning,   R        05/23/22 1350   05/24/22 0500  CBC  Tomorrow  morning,   R        05/23/22 1350   05/23/22 1022  Blood Culture (routine x 2)  (Septic presentation on arrival (screening labs, nursing and treatment orders for obvious sepsis))  BLOOD CULTURE X 2,   STAT      05/23/22 1022            Vitals/Pain Today's Vitals   05/23/22 1409 05/23/22 1411 05/23/22 1415 05/23/22 1430  BP:      Pulse: 99  (!) 104 94  Resp: (!) 28  (!) 25 (!) 27  Temp:  97.7 F (36.5 C)    TempSrc:  Tympanic    SpO2: 91%  90% 95%  Weight:      Height:        Isolation Precautions No active isolations  Medications Medications  lactated ringers infusion ( Intravenous New Bag/Given 05/23/22 1059)  cefTRIAXone (ROCEPHIN) 2 g  in sodium chloride 0.9 % 100 mL IVPB (0 g Intravenous Stopped 05/23/22 1232)  enoxaparin (LOVENOX) injection 40 mg (has no administration in time range)  ipratropium-albuterol (DUONEB) 0.5-2.5 (3) MG/3ML nebulizer solution 3 mL (has no administration in time range)  revefenacin (YUPELRI) nebulizer solution 175 mcg (has no administration in time range)  budesonide (PULMICORT) nebulizer solution 0.25 mg (has no administration in time range)  arformoterol (BROVANA) nebulizer solution 15 mcg (has no administration in time range)  QUEtiapine (SEROQUEL) tablet 100 mg (has no administration in time range)  montelukast (SINGULAIR) tablet 10 mg (has no administration in time range)  azithromycin (ZITHROMAX) tablet 500 mg (has no administration in time range)  predniSONE (DELTASONE) tablet 40 mg (has no administration in time range)  ipratropium (ATROVENT) 0.02 % nebulizer solution (  Given 05/23/22 0944)  albuterol (PROVENTIL) (2.5 MG/3ML) 0.083% nebulizer solution (  Given 05/23/22 0944)    Mobility walks with device     Focused Assessments Pulmonary Assessment Handoff:  Lung sounds: Bilateral Breath Sounds: Expiratory wheezes L Breath Sounds: Expiratory wheezes R Breath Sounds: Expiratory wheezes O2 Device: Nasal Cannula O2 Flow Rate (L/min): 2 L/min    R Recommendations: See Admitting Provider Note  Report given to:   Additional Notes: See top of SBAR

## 2022-05-23 NOTE — ED Provider Notes (Signed)
[facility] DEPARTMENT AT [facility] Provider Note   CSN: [id number] Arrival date & time: 05/23/22  0930     History  Chief Complaint  Patient presents with   Shortness of Breath    Rachel Nolan is a 52 y.o. female.   Shortness of Breath   52 year old female presents emergency department with complaints of shortness of breath.  Patient states the symptoms worsened abruptly today.  Patient called EMS due to difficulty breathing who gave her 2 DuoNeb's, CPAP, 2 g of magnesium sulfate, 125 of Solu-Medrol as well as 3 mg of epinephrine prior to coming to the emergency department.  Patient on BiPAP initially.  Complaining of shortness of breath as well as cough.  Denies fever but does report some chest tightness.  Denies abdominal pain, nausea, vomiting, urinary symptoms, change in bowel habits.  Patient does report compliance with her home medications.  She states that she regularly smokes cocaine with most recent usage last night.  PMH significant for COPD, htn, polysubstance abuse,   Home Medications Prior to Admission medications   Medication Sig Start Date End Date Taking? Authorizing Provider  albuterol (VENTOLIN HFA) 108 (90 Base) MCG/ACT inhaler Inhale 2 puffs into the lungs every 4 (four) hours as needed for shortness of breath or wheezing. 12/15/21   Lorin Glass, MD  amLODipine (NORVASC) 5 MG tablet Take 10 mg by mouth daily. 04/15/22 07/14/22  [provider]  celecoxib (CELEBREX) 200 MG capsule Take 200 mg by mouth 2 (two) times daily. 04/19/22   [provider]  fluticasone furoate-vilanterol (BREO ELLIPTA) 200-25 MCG/ACT AEPB Inhale 1 puff into the lungs daily. 02/28/22   Nooruddin, Jason Fila, MD  gabapentin (NEURONTIN) 400 MG capsule Take 1 capsule (400 mg total) by mouth 2 (two) times daily as needed (For pain). 02/28/22   Nooruddin, Jason Fila, MD  hydrocortisone cream 1 % Apply to affected area 2 times daily 03/05/22   Carlisle Beers, FNP   ipratropium-albuterol (DUONEB) 0.5-2.5 (3) MG/3ML SOLN Take 3 mLs by nebulization every 6 (six) hours as needed (Use FIRST). 12/15/21 01/14/22  Dahal, Melina Schools, MD  montelukast (SINGULAIR) 10 MG tablet Take 1 tablet (10 mg total) by mouth at bedtime. 12/15/21   Lorin Glass, MD  QUEtiapine (SEROQUEL) 100 MG tablet Take 1 tablet (100 mg total) by mouth at bedtime. 02/28/22   Nooruddin, Jason Fila, MD  citalopram (CELEXA) 20 MG tablet Take 1 tablet (20 mg total) by mouth daily. 03/07/19 07/23/19  Aldean Baker, NP  lisinopril (ZESTRIL) 10 MG tablet Take 1 tablet (10 mg total) by mouth daily. 03/07/19 09/11/19  Aldean Baker, NP  mometasone-formoterol (DULERA) 200-5 MCG/ACT AERO Inhale 2 puffs into the lungs 2 (two) times daily. Patient not taking: Reported on 12/12/2021 07/09/21 12/15/21  Kathlen Mody, MD  pantoprazole (PROTONIX) 40 MG tablet Take 1 tablet (40 mg total) by mouth daily. 03/07/19 07/23/19  Aldean Baker, NP      Allergies    Patient has no known allergies.    Review of Systems   Review of Systems  Respiratory:  Positive for shortness of breath.     Physical Exam Updated Vital Signs BP (!) 122/92   Pulse 94   Temp 97.7 F (36.5 C) (Tympanic)   Resp (!) 27   Ht 5\' 6"  (1.676 m)   Wt 86.2 kg   LMP 04/19/2018   SpO2 95%   BMI 30.67 kg/m  Physical Exam Vitals and nursing note reviewed.  Constitutional:  General: She is in acute distress.     Appearance: She is well-developed. She is ill-appearing.  HENT:     Head: Normocephalic and atraumatic.  Eyes:     Conjunctiva/sclera: Conjunctivae normal.  Cardiovascular:     Rate and Rhythm: Normal rate and regular rhythm.     Heart sounds: No murmur heard. Pulmonary:     Effort: Tachypnea, accessory muscle usage and respiratory distress present.     Breath sounds: Wheezing and rhonchi present.  Abdominal:     Palpations: Abdomen is soft.     Tenderness: There is no abdominal tenderness.  Musculoskeletal:        General: No  swelling.     Cervical back: Neck supple.     Right lower leg: No edema.     Left lower leg: No edema.  Skin:    General: Skin is warm and dry.     Capillary Refill: Capillary refill takes less than 2 seconds.  Neurological:     Mental Status: She is alert.  Psychiatric:        Mood and Affect: Mood normal.     ED Results / Procedures / Treatments   Labs (all labs ordered are listed, but only abnormal results are displayed) Labs Reviewed  COMPREHENSIVE METABOLIC PANEL - Abnormal; Notable for the following components:      Result Value   Glucose, Bld 122 (*)    Albumin 3.1 (*)    All other components within normal limits  CBC WITH DIFFERENTIAL/PLATELET - Abnormal; Notable for the following components:   WBC 12.8 (*)    RBC 3.69 (*)    Hemoglobin 11.6 (*)    HCT 34.5 (*)    Neutro Abs 7.9 (*)    Monocytes Absolute 1.6 (*)    All other components within normal limits  I-STAT VENOUS BLOOD GAS, ED - Abnormal; Notable for the following components:   pH, Ven 7.439 (*)    pO2, Ven 71 (*)    Bicarbonate 31.0 (*)    Acid-Base Excess 6.0 (*)    All other components within normal limits  SARS CORONAVIRUS 2 BY RT PCR  CULTURE, BLOOD (ROUTINE X 2)  CULTURE, BLOOD (ROUTINE X 2)  LACTIC ACID, PLASMA  LACTIC ACID, PLASMA  PROTIME-INR  APTT  TROPONIN I (HIGH SENSITIVITY)  TROPONIN I (HIGH SENSITIVITY)    EKG None  Radiology DG Chest Port 1 View  Result Date: 05/23/2022 CLINICAL DATA:  Shortness of breath EXAM: PORTABLE CHEST 1 VIEW COMPARISON:  02/24/2022 FINDINGS: Heart size is normal. Chronic scoliosis with Harrington rod. Left lung is clear. Suspicion of right lower lobe infiltrate. Consider two-view examination for confirmation. No visible effusion. IMPRESSION: Suspicion of right lower lobe infiltrate. Consider two-view examination for confirmation. Electronically Signed   By: Paulina Fusi M.D.   On: 05/23/2022 10:02    Procedures .Critical Care  Performed by: Peter Garter, PA Authorized by: Peter Garter, PA   Critical care provider statement:    Critical care time (minutes):  65   Critical care was necessary to treat or prevent imminent or life-threatening deterioration of the following conditions:  Sepsis and respiratory failure   Critical care was time spent personally by me on the following activities:  Development of treatment plan with patient or surrogate, discussions with consultants, evaluation of patient's response to treatment, examination of patient, ordering and review of laboratory studies, ordering and review of radiographic studies, ordering and performing treatments and interventions, pulse oximetry, re-evaluation  of patient's condition and review of old charts   I assumed direction of critical care for this patient from another provider in my specialty: no     Care discussed with: admitting provider       Medications Ordered in ED Medications  lactated ringers infusion ( Intravenous New Bag/Given 05/23/22 1059)  cefTRIAXone (ROCEPHIN) 2 g in sodium chloride 0.9 % 100 mL IVPB (0 g Intravenous Stopped 05/23/22 1232)  enoxaparin (LOVENOX) injection 40 mg (has no administration in time range)  ipratropium-albuterol (DUONEB) 0.5-2.5 (3) MG/3ML nebulizer solution 3 mL (has no administration in time range)  revefenacin (YUPELRI) nebulizer solution 175 mcg (has no administration in time range)  budesonide (PULMICORT) nebulizer solution 0.25 mg (has no administration in time range)  arformoterol (BROVANA) nebulizer solution 15 mcg (has no administration in time range)  QUEtiapine (SEROQUEL) tablet 100 mg (has no administration in time range)  montelukast (SINGULAIR) tablet 10 mg (has no administration in time range)  azithromycin (ZITHROMAX) tablet 500 mg (has no administration in time range)  predniSONE (DELTASONE) tablet 40 mg (has no administration in time range)  ipratropium (ATROVENT) 0.02 % nebulizer solution (  Given 05/23/22 0944)   albuterol (PROVENTIL) (2.5 MG/3ML) 0.083% nebulizer solution (  Given 05/23/22 0944)    ED Course/ Medical Decision Making/ A&P                             Medical Decision Making Amount and/or Complexity of Data Reviewed Labs: ordered. Radiology: ordered.  Risk Prescription drug management. Decision regarding hospitalization.   This patient presents to the ED for concern of shortness of breath, this involves an extensive number of treatment options, and is a complaint that carries with it a high risk of complications and morbidity.  The differential diagnosis includes The causes for shortness of breath include but are not limited to Cardiac (AHF, pericardial effusion and tamponade, arrhythmias, ischemia, etc) Respiratory (COPD, asthma, pneumonia, pneumothorax, primary pulmonary hypertension, PE/VQ mismatch) Hematological (anemia)  Co morbidities that complicate the patient evaluation  See HPI   Additional history obtained:  Additional history obtained from EMR External records from outside source obtained and reviewed including hospital records   Lab Tests:  I Ordered, and personally interpreted labs.  The pertinent results include: Leukocytosis of 12.8 with left shift.  Evidence of anemia with a hemoglobin of 11.6 of which is near patient's baseline from prior laboratory studies performed.  Platelets within normal range.  No electrolyte abnormalities.  No transaminitis.  No renal dysfunction.  COVID-negative.  Lactic acid within normal limits.  PT/INR and APTT within normal limits.  VBG significant for slight alkalosis of 7.49 but with normal bicarb.   Imaging Studies ordered:  I ordered imaging studies including chest x-ray I independently visualized and interpreted imaging which showed suspicious right lower lobe infiltrate I agree with the radiologist interpretation  Cardiac Monitoring: / EKG:  The patient was maintained on a cardiac monitor.  I personally viewed  and interpreted the cardiac monitored which showed an underlying rhythm of: Sinus rhythm with T wave inversion in V1 and V2 with evidence of left atrial enlargement   Consultations Obtained:  I requested consultation with attending physician Dr. Posey Rea independently evaluated the patient was agreement with treatment plan going forward  Problem List / ED Course / Critical interventions / Medication management  Shortness of breath, COPD exacerbation, pneumonia I ordered medication including albuterol, Atrovent, lactated Ringer's, Rocephin, azithromycin  Reevaluation of the patient after these medicines showed that the patient improved I have reviewed the patients home medicines and have made adjustments as needed   Social Determinants of Health:  Homelessness.  Chronic cocaine use.  Chronic cigarette use.   Test / Admission - Considered:  Shortness of breath, COPD exacerbation, pneumonia Vitals signs within normal range and stable throughout visit. Laboratory/imaging studies significant for: See above 52 year old female presents emergency department with complaints of shortness of breath as well as cough.  Patient with evidence of possible right-sided pneumonia as well as concurrent COPD exacerbation.  Given patient's initial tachypnea, tachycardia, leukocytosis with evidence of possible right-sided pneumonia, sepsis protocol was followed.  Patient was begun on broad-spectrum antibiotics in the form of Rocephin as well as azithromycin.  Patient's work of breathing significantly improved with BiPAP with continuous nebulized therapy.  Given patient's requirement of BiPAP for labored breathing as well as evidence of SIRS criteria with evidence of pneumonia, admission to the hospital deemed most necessary.  Consultation was made to hospitalist Dr. Mikey Bussing who agreed with admission and assume further treatment/care.  Treatment plan were discussed at length with patient and they knowledge  understanding was agreeable to said plan.  Appropriate consultations were made as described in the ED course.  Patient was stable upon admission to the hospital.         Final Clinical Impression(s) / ED Diagnoses Final diagnoses:  COPD exacerbation (HCC)  Community acquired pneumonia of right lower lobe of lung  Sepsis, due to unspecified organism, unspecified whether acute organ dysfunction present [facility])    Rx / DC Orders ED Discharge Orders     None         Peter Garter, [place] 05/23/22 1447    Glendora Score, MD 05/23/22 1821

## 2022-05-23 NOTE — ED Notes (Addendum)
Per [place] MD, RT has been called to take pt off bipap

## 2022-05-23 NOTE — H&P (Cosign Needed Addendum)
Date: 05/23/2022               Patient Name:  Rachel Nolan MRN: [id number]  DOB: February 13, 1970 Age / Sex: 52 y.o., female   PCP: Morene Crocker, MD         Medical Service: Internal Medicine Teaching Service         Attending Physician: Dr. Gust Rung, DO    First Contact: Lajuana Ripple, MD      Pager: [phone]      Second Contact: Evlyn Kanner, MD      Pager: Turner Daniels [phone]           After Hours (After 5p/  First Contact Pager: [phone]  weekends / holidays): Second Contact Pager: [phone]   SUBJECTIVE   Chief Complaint: breath shortness   History of Present Illness:  Rachel Nolan is a 52 year old female with a past medical history of obesity, hypertension, polysubstance use, asthma, COPD, and severe MDD who presented with breath shortness.  Patient developed breath shortness about two days ago. Additional symptoms have included productive cough, chest tightness, and subjective fever. These symptoms have worsened over the last day, prompting her to seek medical attention. She was given a corticosteroid, SAMA, SABA, and placed on BPAP en route to the ED. Denies rhinorrhea, nausea, vomiting, diarrhea, dysuria, known sick contacts, abdominal discomfort, and any bowel or bladder changes. History notable for cocaine use, patient reports using within last 24 hours. Of note, patient is currently homeless. Frequently stays with her sister whose home contains mold and noticeably worsens the patient's coughing and breathing status. States that symptoms started prior to but worsened over the last day after staying the night with her sister.    ED Course: Upon arrival to the ED, vitals notable for HR 106, RR 24, BP 140/121, and SpO2 100 on BPAP. Laboratory testing demonstrated Gluc 122, Alb 3.1, WBC 12.8, Hgb 11.6, pH 7.44, and Bicarb 31. Chest radiograph revealed opacities in right lower lobe concerning for infiltrate. Ipratropium and albuterol were administered in  the ED. Ceftriaxone and azithromycin were administered for suspected pneumonia. Intravenous bolus 1L LR given.   Meds:   Albuterol 108ug q4 PRN Fluticasone-vilanterol 200-25ug q24 Ipratropium-albuterol 0.5-2.5mg  q6 PRN Gabapentin 4000mg  q12 PRN Quetiapine 100mg  q24    Past Medical History  Past Surgical History:  Procedure Laterality Date   BACK SURGERY     ECTOPIC PREGNANCY SURGERY       Social:  Lives With: homeless, spends some nights with sister Occupation: none Support: sister Level of Function: independent in ADLs PCP: Morene Crocker MD Substances: drinks alcohol several times per week, smokes cigarettes about 0.5ppd, uses cocaine several times per week    Family History:   Family History  Problem Relation Age of Onset   Hypertension Mother    Hypertension Father       Allergies:  Allergies as of 05/23/2022   (No Known Allergies)      Review of Systems: A complete ROS was negative except as per HPI.    OBJECTIVE:   Physical Exam: Blood pressure 123/87, pulse (!) 104, temperature 97.7 F (36.5 C), temperature source Axillary, resp. rate (!) 37, height 5\' 6"  (1.676 m), weight 86.2 kg, last menstrual period 04/19/2018, SpO2 100 %.   General:      awake and alert, lying uncomfortably in bed, cooperative, not in acute distress Skin:       warm and dry, intact without any obvious lesions or scars, no rashes Head:  normocephalic and atraumatic, oral mucosa moist  Eyes:      extraocular movements intact, pupils round, no periorbital swelling or scleral icterus Lungs:      increased respiratory effort, breathing labored, symmetrical chest rise, diffuse crackles and prominent expiratory wheezing, diminished breath sounds in RLL Cardiac:      tachycardic with regular rhythm, normal S1 and S2, capillary refill ~2 seconds, no pitting edema Abdomen:      soft and non-distended, normoactive bowel sounds, no tenderness to palpation or  guarding Neurologic:      oriented to person-place-time, moving all extremities, no gross focal deficits Psychiatric:      euthymic mood with congruent affect, intelligible speech   Labs: CBC    Component Value Date/Time   WBC 12.8 (H) 05/23/2022 0941   RBC 3.69 (L) 05/23/2022 0941   HGB 12.2 05/23/2022 0952   HCT 36.0 05/23/2022 0952   PLT 254 05/23/2022 0941   MCV 93.5 05/23/2022 0941   MCH 31.4 05/23/2022 0941   MCHC 33.6 05/23/2022 0941   RDW 14.9 05/23/2022 0941   LYMPHSABS 3.2 05/23/2022 0941   MONOABS 1.6 (H) 05/23/2022 0941   EOSABS 0.1 05/23/2022 0941   BASOSABS 0.0 05/23/2022 0941     CMP     Component Value Date/Time   NA 141 05/23/2022 0952   K 3.6 05/23/2022 0952   CL 104 05/23/2022 0941   CO2 27 05/23/2022 0941   GLUCOSE 122 (H) 05/23/2022 0941   BUN 12 05/23/2022 0941   CREATININE 0.77 05/23/2022 0941   CALCIUM 9.0 05/23/2022 0941   PROT 6.6 05/23/2022 0941   ALBUMIN 3.1 (L) 05/23/2022 0941   AST 17 05/23/2022 0941   ALT 12 05/23/2022 0941   ALKPHOS 74 05/23/2022 0941   BILITOT 0.5 05/23/2022 0941   GFRNONAA >60 05/23/2022 0941   GFRAA >60 08/28/2019 2026     Imaging:  DG Chest 1 View Result Date: 05/23/2022 IMPRESSION: Right lower lobe pneumonia.   DG Chest Port 1 View Result Date: 05/23/2022 IMPRESSION: Suspicion of right lower lobe infiltrate. Consider two-view examination for confirmation.     ECG: My personal interpretation is sinus tachycardia and left atrial enlargement, which is new compared to prior ECG on 02-24-2022    ASSESSMENT & PLAN:   Assessment & Plan by Problem: Principal Problem:   Acute hypoxic respiratory failure [facility])   Rachel Nolan is a 52 year old female with a past medical history of obesity, hypertension, polysubstance use, asthma, COPD, and severe MDD who presented with breath shortness, now admitted for acute hypoxic respiratory failure presumed secondary to pneumonia.    ---Acute hypoxic respiratory failure  secondary to pneumonia ---Acute on chronic obstructive pulmonary disease Patient has history of COPD managed at home with fluticasone-vilanterol daily and ipratropium-albuterol as needed, to which she reports good adherence. Presented on 5-5 with worsening breath shortness and productive cough. Upon arrival, vitals notable for hypoxia and patient placed on BPAP. Laboratory testing demonstrated mild leukocytosis. Chest radiograph revealed right lower lobe opacities concerning for infiltrate. Exam notable for diffuse pulmonary crackles with diminished breath sounds in right lower lobe. Presentation consistent with acute COPD exacerbation precipitated by pneumonia. Antibiotic regimen of ceftriaxone and azithromycin was started on admission for pneumonia, community-acquired subtype. Ipratropium-albuterol, arformoterol, revefenacin, budesonide, and prednisone given for COPD exacerbation.   > Ipratropium-albuterol 0.5-2.5mg  q4  > Arformoterol 15ug q12  > Revefenacin 175ug q24  > Budesonide 0.25mg  q12  > Prednisone 40mg  q24, last dose 5-8  > Ceftriaxone 2g q24, last  dose 5-9  > Azithromycin 500mg  q24, last dose 5-8  > Trend CBC q24  > Trend BMP q24  > Supplemental oxygen or BPAP to maintain SpO2 88-92%  > Follow blood cultures, no growth d0   ---Thoracolumbar scoliosis Patient has history of thoracolumbar scoliosis previously treated with right posterior stabilizing rod. She reports longstanding pain associated with her back managed at home with gabapentin. This medication was continued on admission.   > Gabapentin 400mg  q12 PRN   ---Severe major depressive disorder Patient has history of recurrent severe MDD managed at home with quetiapine, to which she reports daily adherence. Quetiapine at home dose was continued on admission.  > Quetiapine 100mg  q24   ---Polysubstance use Patient has longstanding history of using multiple substances including alcohol, tobacco, and cocaine. Regularly drinks  alcohol and smokes cigarettes. Most recent cocaine use was one day prior to arrival. Counseling on importance of cessation was provided during initial encounter, patient may further benefit from pharmacological assistance.  > Encourage cessation      Diet: Normal VTE: Enoxaparin IVF: None,None Code: DNR  Prior to Admission Living Arrangement: Homeless Anticipated Discharge Location:  [place] Barriers to Discharge: oxygen requirement, medical management  Dispo: Admit patient to Observation with expected length of stay less than 2 midnights.  Signed: Crissie Sickles, MD Internal Medicine Resident PGY-1  05/23/2022, 6:13 PM

## 2022-05-24 ENCOUNTER — Encounter (HOSPITAL_COMMUNITY): Payer: Self-pay

## 2022-05-24 DIAGNOSIS — F329 Major depressive disorder, single episode, unspecified: Secondary | ICD-10-CM

## 2022-05-24 DIAGNOSIS — Z9981 Dependence on supplemental oxygen: Secondary | ICD-10-CM | POA: Diagnosis not present

## 2022-05-24 DIAGNOSIS — J9601 Acute respiratory failure with hypoxia: Principal | ICD-10-CM

## 2022-05-24 DIAGNOSIS — Z66 Do not resuscitate: Secondary | ICD-10-CM | POA: Diagnosis present

## 2022-05-24 DIAGNOSIS — Z5900 Homelessness unspecified: Secondary | ICD-10-CM | POA: Diagnosis not present

## 2022-05-24 DIAGNOSIS — J440 Chronic obstructive pulmonary disease with acute lower respiratory infection: Secondary | ICD-10-CM

## 2022-05-24 DIAGNOSIS — Z79899 Other long term (current) drug therapy: Secondary | ICD-10-CM | POA: Diagnosis not present

## 2022-05-24 DIAGNOSIS — Z8249 Family history of ischemic heart disease and other diseases of the circulatory system: Secondary | ICD-10-CM | POA: Diagnosis not present

## 2022-05-24 DIAGNOSIS — Z7951 Long term (current) use of inhaled steroids: Secondary | ICD-10-CM | POA: Diagnosis not present

## 2022-05-24 DIAGNOSIS — E876 Hypokalemia: Secondary | ICD-10-CM

## 2022-05-24 DIAGNOSIS — G4733 Obstructive sleep apnea (adult) (pediatric): Secondary | ICD-10-CM | POA: Diagnosis present

## 2022-05-24 DIAGNOSIS — J441 Chronic obstructive pulmonary disease with (acute) exacerbation: Secondary | ICD-10-CM

## 2022-05-24 DIAGNOSIS — R0602 Shortness of breath: Secondary | ICD-10-CM | POA: Diagnosis not present

## 2022-05-24 DIAGNOSIS — I10 Essential (primary) hypertension: Secondary | ICD-10-CM | POA: Diagnosis present

## 2022-05-24 DIAGNOSIS — F17210 Nicotine dependence, cigarettes, uncomplicated: Secondary | ICD-10-CM | POA: Diagnosis present

## 2022-05-24 DIAGNOSIS — M419 Scoliosis, unspecified: Secondary | ICD-10-CM | POA: Diagnosis present

## 2022-05-24 DIAGNOSIS — J189 Pneumonia, unspecified organism: Secondary | ICD-10-CM | POA: Diagnosis present

## 2022-05-24 DIAGNOSIS — M4135 Thoracogenic scoliosis, thoracolumbar region: Secondary | ICD-10-CM

## 2022-05-24 DIAGNOSIS — F1410 Cocaine abuse, uncomplicated: Secondary | ICD-10-CM | POA: Diagnosis present

## 2022-05-24 DIAGNOSIS — F1910 Other psychoactive substance abuse, uncomplicated: Secondary | ICD-10-CM

## 2022-05-24 DIAGNOSIS — Z1152 Encounter for screening for COVID-19: Secondary | ICD-10-CM | POA: Diagnosis not present

## 2022-05-24 LAB — CBC
HCT: 31.3 % — ABNORMAL LOW (ref 36.0–46.0)
Hemoglobin: 10.6 g/dL — ABNORMAL LOW (ref 12.0–15.0)
MCH: 31.4 pg (ref 26.0–34.0)
MCHC: 33.9 g/dL (ref 30.0–36.0)
MCV: 92.6 fL (ref 80.0–100.0)
Platelets: 230 10*3/uL (ref 150–400)
RBC: 3.38 MIL/uL — ABNORMAL LOW (ref 3.87–5.11)
RDW: 14.7 % (ref 11.5–15.5)
WBC: 17.7 10*3/uL — ABNORMAL HIGH (ref 4.0–10.5)
nRBC: 0 % (ref 0.0–0.2)

## 2022-05-24 LAB — BASIC METABOLIC PANEL
Anion gap: 7 (ref 5–15)
BUN: 13 mg/dL (ref 6–20)
CO2: 28 mmol/L (ref 22–32)
Calcium: 8.7 mg/dL — ABNORMAL LOW (ref 8.9–10.3)
Chloride: 105 mmol/L (ref 98–111)
Creatinine, Ser: 0.64 mg/dL (ref 0.44–1.00)
GFR, Estimated: >60 mL/min (ref >60)
Glucose, Bld: 143 mg/dL — ABNORMAL HIGH (ref 70–99)
Potassium: 3.4 mmol/L — ABNORMAL LOW (ref 3.5–5.1)
Sodium: 140 mmol/L (ref 135–145)

## 2022-05-24 LAB — MAGNESIUM: Magnesium: 2.1 mg/dL (ref 1.7–2.4)

## 2022-05-24 LAB — HIV ANTIBODY (ROUTINE TESTING W REFLEX): HIV Screen 4th Generation wRfx: NONREACTIVE

## 2022-05-24 LAB — CULTURE, BLOOD (ROUTINE X 2): Culture: NO GROWTH

## 2022-05-24 MED ORDER — HYDROCOD POLI-CHLORPHE POLI ER 10-8 MG/5ML PO SUER
5.0000 mL | Freq: Two times a day (BID) | ORAL | Status: DC
  Administered 2022-05-24 – 2022-05-26 (×5): 5 mL via ORAL
  Filled 2022-05-24 (×5): qty 5

## 2022-05-24 MED ORDER — POTASSIUM CHLORIDE 20 MEQ PO PACK
40.0000 meq | PACK | Freq: Once | ORAL | Status: AC
  Administered 2022-05-24: 40 meq via ORAL
  Filled 2022-05-24: qty 2

## 2022-05-24 MED ORDER — GUAIFENESIN ER 600 MG PO TB12
1200.0000 mg | ORAL_TABLET | Freq: Two times a day (BID) | ORAL | Status: DC
  Administered 2022-05-24 – 2022-05-26 (×5): 1200 mg via ORAL
  Filled 2022-05-24 (×5): qty 2

## 2022-05-24 MED ORDER — AZITHROMYCIN 500 MG PO TABS
500.0000 mg | ORAL_TABLET | Freq: Every day | ORAL | Status: AC
  Administered 2022-05-24 – 2022-05-25 (×2): 500 mg via ORAL
  Filled 2022-05-24 (×2): qty 1

## 2022-05-24 MED ORDER — BENZONATATE 100 MG PO CAPS
100.0000 mg | ORAL_CAPSULE | Freq: Two times a day (BID) | ORAL | Status: DC | PRN
  Administered 2022-05-24 – 2022-05-25 (×3): 100 mg via ORAL
  Filled 2022-05-24 (×3): qty 1

## 2022-05-24 NOTE — Progress Notes (Addendum)
NAME:  Rachel Nolan, MRN:  [id number], DOB:  [birth year], LOS: 0 ADMISSION DATE:  05/23/2022  Subjective  NAEON/Overnight events: Continues on 4L [place]   Patient evaluated at bedside this AM. Noted to be coughing up yellow sputum and continued harsh coughing during exam today. Reports feeling SOB. Reports that she uses O2 at home, 5L daily. Reports sister's house has mold that she feels precipitated this episode. O2 supplies are at home with sister.   Objective   Blood pressure 129/85, pulse (!) 106, temperature 98.3 F (36.8 C), temperature source Oral, resp. rate (!) 23, height 5\' 6"  (1.676 m), weight 86.2 kg, last menstrual period 04/19/2018, SpO2 97 %.     Intake/Output Summary (Last 24 hours) at 05/24/2022 1119 Last data filed at 05/24/2022 0835 Gross per 24 hour  Intake 1450 ml  Output --  Net 1450 ml   Filed Weights   05/23/22 1207  Weight: 86.2 kg    Physical Exam: General: alert, appears in moderate acute distress with continued coughing up sputum CV: RRR. Normal S1, S2.  Pulm: Breathing labored. Expiratory wheezing noted throughout. Increased respiratory effort on 4L [place].  Abdomen: soft, non-tender, non-distended  Neuro: oriented to situation, moving all extremities, no gross focal neurologic deficits.  Psych: anxious mood and affect.   Labs       Latest Ref Rng & Units 05/24/2022   12:25 AM 05/23/2022    9:52 AM 05/23/2022    9:41 AM  CBC  WBC 4.0 - 10.5 K/uL 17.7   12.8   Hemoglobin 12.0 - 15.0 g/dL 11.9  14.7  82.9   Hematocrit 36.0 - 46.0 % 31.3  36.0  34.5   Platelets 150 - 400 K/uL 230   254       Latest Ref Rng & Units 05/24/2022   12:25 AM 05/23/2022    9:52 AM 05/23/2022    9:41 AM  BMP  Glucose 70 - 99 mg/dL 562   130   BUN 6 - 20 mg/dL 13   12   Creatinine 8.65 - 1.00 mg/dL 7.84   6.96   Sodium 295 - 145 mmol/L 140  141  140   Potassium 3.5 - 5.1 mmol/L 3.4  3.6  3.6   Chloride 98 - 111 mmol/L 105   104   CO2 22 - 32 mmol/L 28   27   Calcium 8.9 - 10.3  mg/dL 8.7   9.0     Imaging:  DG Chest 1 View Result Date: 05/23/2022 CLINICAL DATA:  Shortness of breath.  COPD exacerbation EXAM: CHEST  1 VIEW COMPARISON:  Earlier same day.  Prior lateral from 01/22/2022 FINDINGS: Lateral view confirms hazy/patchy infiltrate in the right lower lobe. IMPRESSION: Right lower lobe pneumonia. Electronically Signed   By: Paulina Fusi M.D.   On: 05/23/2022 15:31   Summary  [name] is a 52 year old female with a past medical history of obesity, hypertension, polysubstance use, asthma, COPD, and severe MDD who presented with breath shortness, now admitted for acute hypoxic respiratory failure presumed secondary to pneumonia.   Assessment & Plan:  Principal Problem:   Acute hypoxic respiratory failure (HCC)   ---Acute hypoxic respiratory failure secondary to pneumonia ---Acute on chronic obstructive pulmonary disease Patient has history of COPD managed at home with fluticasone-vilanterol daily and ipratropium-albuterol as needed, to which she reports good adherence. Presented on 5-5 with worsening breath shortness and productive cough, consistent with acute COPD exacerbation precipitated by PNA. Chest radiograph revealed right  lower lobe opacities concerning for infiltrate. Antibiotic regimen of ceftriaxone and azithromycin was started on admission for pneumonia, community-acquired subtype. Ipratropium-albuterol, arformoterol, revefenacin, budesonide, and prednisone given for COPD exacerbation. CBC AM notable for increased leukocytosis (17.7>12.8). BMP notable for hypokalemia. Started anti-tussives for continued coughing. Continue on IV ceftriaxone, 3 day course of azithromycin PO. Bcx NGTD.   > Ipratropium-albuterol 0.5-2.5mg  q4  > Arformoterol 15ug q12  > Revefenacin 175ug q24  > Budesonide 0.25mg  q12  > Prednisone 40mg  q24, (5/6-5/9)  > Ceftriaxone 2g q24 (5/5-5/9)  > Azithromycin 500mg  q24 (5/6-5/9)  > Start mucinex 1200mg  BID   > Start tussionex 5mL Q12H    > Trend CBC q24  > Trend BMP q24  > Supplemental oxygen or BPAP to maintain SpO2 88-92%  > Follow blood cultures  -- Hypokalemia  > Replete Kclor   ---Thoracolumbar scoliosis Patient has history of thoracolumbar scoliosis previously treated with right posterior stabilizing rod. She reports longstanding pain associated with her back managed at home with gabapentin. This medication was continued on admission.   > Gabapentin 400mg  q12 PRN   ---Severe major depressive disorder Patient has history of recurrent severe MDD managed at home with quetiapine, to which she reports daily adherence. Quetiapine at home dose was continued on admission.  > Quetiapine 100mg  q24   ---Polysubstance use Patient has longstanding history of using multiple substances including alcohol, tobacco, and cocaine. Regularly drinks alcohol and smokes cigarettes. Most recent cocaine use was one day prior to arrival. Counseling on importance of cessation was provided during initial encounter, patient may further benefit from pharmacological assistance.  > Encourage cessation   Best practice:  DIET: regular  IVF: none DVT PPX: lovenox  BOWEL: none CODE: DNR   Prior to Admission Living Arrangement: Homeless Anticipated Discharge Location: [place] Barriers to Discharge: medical management  Karie Fetch, MD Internal Medicine Resident PGY-1 PAGER: [phone] 05/24/2022 11:19 AM  If after hours (below), please contact on-call pager: [phone] 5PM-7AM Monday-Friday 1PM-7AM Saturday-Sunday
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
## 2022-05-24 NOTE — Progress Notes (Signed)
  Transition of Care [facility]) Screening Note   Patient Details  Name: Rachel Nolan Date of Birth: [birth year]   Transition of Care [facility]) CM/SW Contact:    Harriet Masson, RN Phone Number: 05/24/2022, 9:28 AM    Transition of Care Department [facility]) has reviewed patient and no TOC needs have been identified at this time. We will continue to monitor patient advancement through interdisciplinary progression rounds. If new patient transition needs arise, please place a TOC consult.
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
## 2022-05-25 LAB — BASIC METABOLIC PANEL
Anion gap: 11 (ref 5–15)
BUN: 11 mg/dL (ref 6–20)
CO2: 26 mmol/L (ref 22–32)
Calcium: 8.6 mg/dL — ABNORMAL LOW (ref 8.9–10.3)
Chloride: 102 mmol/L (ref 98–111)
Creatinine, Ser: 0.7 mg/dL (ref 0.44–1.00)
GFR, Estimated: >60 mL/min (ref >60)
Glucose, Bld: 157 mg/dL — ABNORMAL HIGH (ref 70–99)
Potassium: 3.1 mmol/L — ABNORMAL LOW (ref 3.5–5.1)
Sodium: 139 mmol/L (ref 135–145)

## 2022-05-25 LAB — CBC
HCT: 30.5 % — ABNORMAL LOW (ref 36.0–46.0)
Hemoglobin: 10 g/dL — ABNORMAL LOW (ref 12.0–15.0)
MCH: 30.9 pg (ref 26.0–34.0)
MCHC: 32.8 g/dL (ref 30.0–36.0)
MCV: 94.1 fL (ref 80.0–100.0)
Platelets: 252 10*3/uL (ref 150–400)
RBC: 3.24 MIL/uL — ABNORMAL LOW (ref 3.87–5.11)
RDW: 14.9 % (ref 11.5–15.5)
WBC: 14.3 10*3/uL — ABNORMAL HIGH (ref 4.0–10.5)
nRBC: 0 % (ref 0.0–0.2)

## 2022-05-25 LAB — CULTURE, BLOOD (ROUTINE X 2)
Culture: NO GROWTH
Special Requests: ADEQUATE

## 2022-05-25 MED ORDER — POTASSIUM CHLORIDE 20 MEQ PO PACK
40.0000 meq | PACK | Freq: Two times a day (BID) | ORAL | Status: DC
  Administered 2022-05-25: 40 meq via ORAL
  Filled 2022-05-25: qty 2

## 2022-05-25 MED ORDER — POTASSIUM CHLORIDE CRYS ER 20 MEQ PO TBCR
40.0000 meq | EXTENDED_RELEASE_TABLET | Freq: Once | ORAL | Status: AC
  Administered 2022-05-25: 40 meq via ORAL
  Filled 2022-05-25: qty 2

## 2022-05-25 NOTE — Progress Notes (Signed)
Ms. Czarnik has Acute on Chronic hypoxic/hypercapnic respiratory failure secondary to COPD. Pt requires frequent durations of respiratory support and deteriorates quickly in the absence of non-invasive mechanical ventilator. BIPAP,BIPAP ST, AVAPS has been considered but has been ruled-out and insufficient. NIV therapy is needed Pt's PC02 was >49.6. during this hospital stay on 04/08/2022. Interruption or failure to provide NIV would quickly lead to exacerbation of the patient's condition, lead to hospitalization and likely harm the patient or possibly death. Continued use of the NIV is preferred. Patient is able to maintain airway and clear secretions. The patient is a chronic CO2 retainer who continues to decline. Because of chronic respiratory failure secondary to COPD and rapidly changing needs, the patient needs volume targeted support. Without non-invasive ventilations at home, the patient will continue to be readmitted and may be subject to premature death.

## 2022-05-25 NOTE — Progress Notes (Signed)
Patient refuses NIV for the night. Patient is coughing a lot throughout the night. Schedule nebs given. VSS
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
## 2022-05-26 ENCOUNTER — Other Ambulatory Visit (HOSPITAL_COMMUNITY): Payer: Self-pay

## 2022-05-26 ENCOUNTER — Other Ambulatory Visit: Payer: Self-pay

## 2022-05-26 DIAGNOSIS — J189 Pneumonia, unspecified organism: Secondary | ICD-10-CM | POA: Diagnosis not present

## 2022-05-26 DIAGNOSIS — J440 Chronic obstructive pulmonary disease with acute lower respiratory infection: Secondary | ICD-10-CM | POA: Diagnosis not present

## 2022-05-26 DIAGNOSIS — J441 Chronic obstructive pulmonary disease with (acute) exacerbation: Secondary | ICD-10-CM | POA: Diagnosis not present

## 2022-05-26 DIAGNOSIS — J9601 Acute respiratory failure with hypoxia: Secondary | ICD-10-CM | POA: Diagnosis not present

## 2022-05-26 LAB — CBC
HCT: 33.8 % — ABNORMAL LOW (ref 36.0–46.0)
Hemoglobin: 11.2 g/dL — ABNORMAL LOW (ref 12.0–15.0)
MCH: 31.2 pg (ref 26.0–34.0)
MCHC: 33.1 g/dL (ref 30.0–36.0)
MCV: 94.2 fL (ref 80.0–100.0)
Platelets: 263 10*3/uL (ref 150–400)
RBC: 3.59 MIL/uL — ABNORMAL LOW (ref 3.87–5.11)
RDW: 14.9 % (ref 11.5–15.5)
WBC: 13.9 10*3/uL — ABNORMAL HIGH (ref 4.0–10.5)
nRBC: 0 % (ref 0.0–0.2)

## 2022-05-26 LAB — CULTURE, BLOOD (ROUTINE X 2): Special Requests: ADEQUATE

## 2022-05-26 LAB — BASIC METABOLIC PANEL
Anion gap: 12 (ref 5–15)
BUN: 14 mg/dL (ref 6–20)
CO2: 25 mmol/L (ref 22–32)
Calcium: 8.9 mg/dL (ref 8.9–10.3)
Chloride: 101 mmol/L (ref 98–111)
Creatinine, Ser: 0.66 mg/dL (ref 0.44–1.00)
GFR, Estimated: >60 mL/min (ref >60)
Glucose, Bld: 117 mg/dL — ABNORMAL HIGH (ref 70–99)
Potassium: 4.1 mmol/L (ref 3.5–5.1)
Sodium: 138 mmol/L (ref 135–145)

## 2022-05-26 MED ORDER — HYDROCORTISONE 1 % EX CREA
TOPICAL_CREAM | CUTANEOUS | 0 refills | Status: DC
  Filled 2022-05-26: qty 28, 30d supply, fill #0

## 2022-05-26 MED ORDER — GUAIFENESIN ER 600 MG PO TB12: 1200.0000 mg | ORAL_TABLET | Freq: Two times a day (BID) | ORAL | 0 refills | Status: AC | PRN

## 2022-05-26 MED ORDER — QUETIAPINE FUMARATE 100 MG PO TABS
100.0000 mg | ORAL_TABLET | Freq: Every day | ORAL | 0 refills | Status: DC
  Filled 2022-05-26: qty 30, 30d supply, fill #0

## 2022-05-26 MED ORDER — GABAPENTIN 400 MG PO CAPS
400.0000 mg | ORAL_CAPSULE | Freq: Two times a day (BID) | ORAL | 0 refills | Status: DC | PRN
  Filled 2022-05-26: qty 60, 30d supply, fill #0

## 2022-05-26 MED ORDER — BENZONATATE 100 MG PO CAPS
100.0000 mg | ORAL_CAPSULE | Freq: Two times a day (BID) | ORAL | 0 refills | Status: DC | PRN
  Filled 2022-05-26: qty 20, 10d supply, fill #0

## 2022-05-26 MED ORDER — CEFDINIR 300 MG PO CAPS
300.0000 mg | ORAL_CAPSULE | Freq: Two times a day (BID) | ORAL | 0 refills | Status: AC
  Filled 2022-05-26: qty 2, 1d supply, fill #0

## 2022-05-26 MED ORDER — PREDNISONE 20 MG PO TABS
40.0000 mg | ORAL_TABLET | Freq: Every day | ORAL | 0 refills | Status: AC
  Filled 2022-05-26: qty 2, 1d supply, fill #0

## 2022-05-26 MED ORDER — AMLODIPINE BESYLATE 5 MG PO TABS
10.0000 mg | ORAL_TABLET | Freq: Every day | ORAL | 0 refills | Status: DC
  Filled 2022-05-26: qty 60, 30d supply, fill #0

## 2022-05-26 MED ORDER — BUDESONIDE-FORMOTEROL FUMARATE 160-4.5 MCG/ACT IN AERO
2.0000 | INHALATION_SPRAY | Freq: Two times a day (BID) | RESPIRATORY_TRACT | 0 refills | Status: DC
  Filled 2022-05-26: qty 10.2, 30d supply, fill #0

## 2022-05-26 MED ORDER — IPRATROPIUM-ALBUTEROL 0.5-2.5 (3) MG/3ML IN SOLN
3.0000 mL | Freq: Four times a day (QID) | RESPIRATORY_TRACT | 0 refills | Status: DC | PRN
  Filled 2022-05-26: qty 360, 30d supply, fill #0

## 2022-05-26 MED ORDER — MONTELUKAST SODIUM 10 MG PO TABS
10.0000 mg | ORAL_TABLET | Freq: Every day | ORAL | 0 refills | Status: DC
  Filled 2022-05-26: qty 30, 30d supply, fill #0

## 2022-05-26 NOTE — Plan of Care (Signed)

## 2022-05-26 NOTE — Progress Notes (Signed)
Patient refused NIV.
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
## 2022-05-26 NOTE — Progress Notes (Signed)
SATURATION QUALIFICATIONS: (This note is used to comply with regulatory documentation for home oxygen)  Patient Saturations on 2L at Rest = 95%  Patient Saturations on 2 Liters of oxygen while Ambulating = 89%  Please briefly explain why patient needs home oxygen: Pt currently uses 2L of oxygen at baseline at home. Was able to ambulate without dropping blow 89% while of 2L.

## 2022-05-27 ENCOUNTER — Telehealth: Payer: Self-pay

## 2022-05-27 LAB — CULTURE, BLOOD (ROUTINE X 2)

## 2022-05-27 NOTE — Plan of Care (Addendum)
Of note, at time of discharge, it was found that patient's insurance cost for Triple [employer] was $591 for Trelegy and $380 for [facility]. For additional [place] coverage, Spiriva was $460 and $318 for Incruse Ellipta. These costs were due to patient's high deductible. Symbicort had a $30 copay.  Thus, she was discharged with symbicort 160/4.5 mcg .

## 2022-05-27 NOTE — Transitions of Care (Post Inpatient/ED Visit) (Signed)
   05/27/2022  Name: Rachel Nolan MRN: [id number] DOB: [birth year]  Today's TOC FU Call Status: Today's TOC FU Call Status:: Unsuccessul Call (1st Attempt) Unsuccessful Call (1st Attempt) Date: 05/27/22  Attempted to reach the patient regarding the most recent Inpatient/ED visit.  Follow Up Plan: Additional outreach attempts will be made to reach the patient to complete the Transitions of Care (Post Inpatient/ED visit) call.   Signature Karena Addison, LPN [facility] Nurse Health Advisor Direct Dial [phone]

## 2022-05-28 LAB — CULTURE, BLOOD (ROUTINE X 2)

## 2022-05-28 NOTE — Transitions of Care (Post Inpatient/ED Visit) (Unsigned)
   05/28/2022  Name: Rachel Nolan MRN: [id number] DOB: [birth year]  Today's TOC FU Call Status: Today's TOC FU Call Status:: Unsuccessful Call (2nd Attempt) Unsuccessful Call (1st Attempt) Date: 05/27/22 Unsuccessful Call (2nd Attempt) Date: 05/28/22  Attempted to reach the patient regarding the most recent Inpatient/ED visit.  Follow Up Plan: Additional outreach attempts will be made to reach the patient to complete the Transitions of Care (Post Inpatient/ED visit) call.   Signature Karena Addison, LPN [facility] Nurse Health Advisor Direct Dial [phone]

## 2022-05-31 NOTE — Transitions of Care (Post Inpatient/ED Visit) (Signed)
   05/31/2022  Name: Rachel Nolan MRN: [id number] DOB: 1970-07-20  Today's TOC FU Call Status: Today's TOC FU Call Status:: Unsuccessful Call (3rd Attempt) Unsuccessful Call (1st Attempt) Date: 05/27/22 Unsuccessful Call (2nd Attempt) Date: 05/28/22 Unsuccessful Call (3rd Attempt) Date: 05/31/22  Attempted to reach the patient regarding the most recent Inpatient/ED visit.  Follow Up Plan: No further outreach attempts will be made at this time. We have been unable to contact the patient.  Signature Karena Addison, LPN [facility] Nurse Health Advisor Direct Dial [phone]

## 2022-06-08 ENCOUNTER — Encounter: Payer: Medicaid Other | Admitting: Student

## 2022-06-22 ENCOUNTER — Other Ambulatory Visit (HOSPITAL_COMMUNITY): Payer: Self-pay

## 2022-06-24 ENCOUNTER — Other Ambulatory Visit (HOSPITAL_COMMUNITY): Payer: Self-pay

## 2022-06-29 ENCOUNTER — Emergency Department (HOSPITAL_COMMUNITY)
Admission: EM | Admit: 2022-06-29 | Discharge: 2022-06-29 | Disposition: A | Discharge status: Home / Self Care | Payer: Medicaid Other | Attending: Emergency Medicine | Admitting: Emergency Medicine

## 2022-06-29 ENCOUNTER — Other Ambulatory Visit: Payer: Self-pay

## 2022-06-29 ENCOUNTER — Emergency Department (HOSPITAL_COMMUNITY): Payer: Medicaid Other

## 2022-06-29 ENCOUNTER — Encounter (HOSPITAL_COMMUNITY): Payer: Self-pay

## 2022-06-29 DIAGNOSIS — J441 Chronic obstructive pulmonary disease with (acute) exacerbation: Secondary | ICD-10-CM | POA: Diagnosis not present

## 2022-06-29 DIAGNOSIS — Z1152 Encounter for screening for COVID-19: Secondary | ICD-10-CM | POA: Diagnosis not present

## 2022-06-29 DIAGNOSIS — I10 Essential (primary) hypertension: Secondary | ICD-10-CM | POA: Insufficient documentation

## 2022-06-29 DIAGNOSIS — R0602 Shortness of breath: Secondary | ICD-10-CM | POA: Diagnosis present

## 2022-06-29 DIAGNOSIS — Z79899 Other long term (current) drug therapy: Secondary | ICD-10-CM | POA: Diagnosis not present

## 2022-06-29 LAB — RESP PANEL BY RT-PCR (RSV, FLU A&B, COVID)  RVPGX2
Influenza A by PCR: NEGATIVE
Influenza B by PCR: NEGATIVE
Resp Syncytial Virus by PCR: NEGATIVE
SARS Coronavirus 2 by RT PCR: NEGATIVE

## 2022-06-29 LAB — COMPREHENSIVE METABOLIC PANEL
ALT: 8 U/L (ref 0–44)
AST: 14 U/L — ABNORMAL LOW (ref 15–41)
Albumin: 3.6 g/dL (ref 3.5–5.0)
Alkaline Phosphatase: 54 U/L (ref 38–126)
Anion gap: 13 (ref 5–15)
BUN: 13 mg/dL (ref 6–20)
CO2: 24 mmol/L (ref 22–32)
Calcium: 9.3 mg/dL (ref 8.9–10.3)
Chloride: 103 mmol/L (ref 98–111)
Creatinine, Ser: 0.72 mg/dL (ref 0.44–1.00)
GFR, Estimated: >60 mL/min (ref >60)
Glucose, Bld: 99 mg/dL (ref 70–99)
Potassium: 3.7 mmol/L (ref 3.5–5.1)
Sodium: 140 mmol/L (ref 135–145)
Total Bilirubin: 0.3 mg/dL (ref 0.3–1.2)
Total Protein: 6.7 g/dL (ref 6.5–8.1)

## 2022-06-29 LAB — CBC
HCT: 40.3 % (ref 36.0–46.0)
Hemoglobin: 13 g/dL (ref 12.0–15.0)
MCH: 30.2 pg (ref 26.0–34.0)
MCHC: 32.3 g/dL (ref 30.0–36.0)
MCV: 93.7 fL (ref 80.0–100.0)
Platelets: 266 10*3/uL (ref 150–400)
RBC: 4.3 MIL/uL (ref 3.87–5.11)
RDW: 15 % (ref 11.5–15.5)
WBC: 4.6 10*3/uL (ref 4.0–10.5)
nRBC: 0 % (ref 0.0–0.2)

## 2022-06-29 LAB — TROPONIN I (HIGH SENSITIVITY)
Troponin I (High Sensitivity): 6 ng/L (ref <18)
Troponin I (High Sensitivity): 4 ng/L (ref <18)

## 2022-06-29 LAB — I-STAT VENOUS BLOOD GAS, ED
Acid-Base Excess: 3 mmol/L — ABNORMAL HIGH (ref 0.0–2.0)
Bicarbonate: 30.3 mmol/L — ABNORMAL HIGH (ref 20.0–28.0)
Calcium, Ion: 1.12 mmol/L — ABNORMAL LOW (ref 1.15–1.40)
HCT: 43 % (ref 36.0–46.0)
Hemoglobin: 14.6 g/dL (ref 12.0–15.0)
O2 Saturation: 81 %
Potassium: 5.6 mmol/L — ABNORMAL HIGH (ref 3.5–5.1)
Sodium: 139 mmol/L (ref 135–145)
TCO2: 32 mmol/L (ref 22–32)
pCO2, Ven: 53.7 mmHg (ref 44–60)
pH, Ven: 7.36 (ref 7.25–7.43)
pO2, Ven: 48 mmHg — ABNORMAL HIGH (ref 32–45)

## 2022-06-29 LAB — MAGNESIUM: Magnesium: 2.2 mg/dL (ref 1.7–2.4)

## 2022-06-29 LAB — BRAIN NATRIURETIC PEPTIDE: B Natriuretic Peptide: 6 pg/mL (ref 0.0–100.0)

## 2022-06-29 MED ORDER — MAGNESIUM SULFATE 2 GM/50ML IV SOLN
2.0000 g | Freq: Once | INTRAVENOUS | Status: AC
  Administered 2022-06-29: 2 g via INTRAVENOUS
  Filled 2022-06-29: qty 50

## 2022-06-29 MED ORDER — BUDESONIDE-FORMOTEROL FUMARATE 80-4.5 MCG/ACT IN AERO: 2.0000 | INHALATION_SPRAY | Freq: Two times a day (BID) | RESPIRATORY_TRACT | 1 refills | Status: DC

## 2022-06-29 MED ORDER — ALBUTEROL SULFATE (2.5 MG/3ML) 0.083% IN NEBU
10.0000 mg | INHALATION_SOLUTION | Freq: Once | RESPIRATORY_TRACT | Status: AC
  Administered 2022-06-29: 10 mg via RESPIRATORY_TRACT
  Filled 2022-06-29: qty 12

## 2022-06-29 MED ORDER — DOXYCYCLINE HYCLATE 100 MG PO CAPS: 100.0000 mg | ORAL_CAPSULE | Freq: Two times a day (BID) | ORAL | 0 refills | Status: DC

## 2022-06-29 MED ORDER — IPRATROPIUM-ALBUTEROL 0.5-2.5 (3) MG/3ML IN SOLN
RESPIRATORY_TRACT | Status: AC
  Filled 2022-06-29: qty 3

## 2022-06-29 MED ORDER — IPRATROPIUM-ALBUTEROL 0.5-2.5 (3) MG/3ML IN SOLN
3.0000 mL | Freq: Once | RESPIRATORY_TRACT | Status: AC
  Administered 2022-06-29: 3 mL via RESPIRATORY_TRACT

## 2022-06-29 MED ORDER — METHYLPREDNISOLONE SODIUM SUCC 125 MG IJ SOLR
125.0000 mg | Freq: Once | INTRAMUSCULAR | Status: AC
  Administered 2022-06-29: 125 mg via INTRAVENOUS
  Filled 2022-06-29: qty 2

## 2022-06-29 MED ORDER — METHYLPREDNISOLONE 4 MG PO TBPK: ORAL_TABLET | ORAL | 0 refills | Status: DC

## 2022-06-29 MED ORDER — ALBUTEROL SULFATE (2.5 MG/3ML) 0.083% IN NEBU
2.5000 mg | INHALATION_SOLUTION | Freq: Once | RESPIRATORY_TRACT | Status: AC
  Administered 2022-06-29: 2.5 mg via RESPIRATORY_TRACT
  Filled 2022-06-29: qty 3

## 2022-06-29 NOTE — ED Notes (Signed)
Ambulated pt while on cont SPO2. Pt stayed between 98-100% on 3L [place] that she wears baseline. Pt's work of breathing increased while ambulating. PA notified.

## 2022-06-29 NOTE — ED Notes (Signed)
Resp called bi pap ordered

## 2022-06-29 NOTE — Discharge Instructions (Addendum)
Get help right away if: You have shortness of breath while you are resting. You have shortness of breath that prevents you from: Being able to talk. Performing your usual physical activities. You have chest pain lasting longer than 5 minutes. Your skin color is more blue (cyanotic) than usual. You measure low oxygen saturations for longer than 5 minutes with a pulse oximeter. You have a fever. You feel too tired to breathe normally. These symptoms may represent a serious problem that is an emergency. Do not wait to see if the symptoms will go away. Get medical help right away. Call your local emergency services (911 in the [place] Do not drive yourself to the hospital.

## 2022-06-29 NOTE — ED Provider Notes (Signed)
[place] EMERGENCY DEPARTMENT AT [facility] Provider Note   CSN: [id number] Arrival date & time: 06/29/22  1814     History  Chief Complaint  Patient presents with   Shortness of Breath   Chest Pain   Cough    Rachel Nolan is a 52 y.o. female.  With a past medical history of hypertension, hyperlipidemia, COPD, cocaine use disorder, chronic respiratory failure on 2 L, unstable housing situation who presents emergency department with chief complaint of shortness of breath. Patient arrives in extremis and is unable to provide significant history but does states she has been hospitalized for her shortness of breath and that has been going on for about an hour and a half.  She was brought in by private vehicle.  She had onset of chest pain wheezing.  She did have a productive cough starting yesterday.  Patient normally on 2 L currently requiring 3.  Shortness of Breath Associated symptoms: chest pain and cough   Chest Pain Associated symptoms: cough and shortness of breath   Cough Associated symptoms: chest pain and shortness of breath        Home Medications Prior to Admission medications   Medication Sig Start Date End Date Taking? Authorizing Provider  amLODipine (NORVASC) 5 MG tablet Take 2 tablets (10 mg total) by mouth daily. 05/26/22 06/29/22 Yes Karie Fetch, MD  benzonatate (TESSALON) 100 MG capsule Take 1 capsule (100 mg total) by mouth 2 (two) times daily as needed for cough. 05/26/22  Yes Karie Fetch, MD  budesonide-formoterol [facility]) 80-4.5 MCG/ACT inhaler Inhale 2 puffs into the lungs 2 (two) times daily. 06/29/22  Yes Selda Jalbert, PA-C  doxycycline (VIBRAMYCIN) 100 MG capsule Take 1 capsule (100 mg total) by mouth 2 (two) times daily. One po bid x 7 days 06/29/22  Yes Dayton Kenley, PA-C  gabapentin (NEURONTIN) 600 MG tablet Take 600 mg by mouth 3 (three) times daily.   Yes [provider]  hydrocortisone cream 1 % Apply to affected  area 2 times daily Patient taking differently: Apply 1 Application topically daily as needed for itching. 05/26/22  Yes Karie Fetch, MD  ipratropium-albuterol (DUONEB) 0.5-2.5 (3) MG/3ML SOLN Take 3 mLs by nebulization every 6 (six) hours as needed (Use FIRST). 05/26/22 06/29/22 Yes Karie Fetch, MD  methylPREDNISolone (MEDROL DOSEPAK) 4 MG TBPK tablet Use as directed 06/29/22  Yes Reaghan Kawa, PA-C  montelukast (SINGULAIR) 10 MG tablet Take 1 tablet (10 mg total) by mouth at bedtime. 05/26/22  Yes Karie Fetch, MD  QUEtiapine (SEROQUEL) 100 MG tablet Take 1 tablet (100 mg total) by mouth at bedtime. 05/26/22 06/29/22 Yes Karie Fetch, MD  gabapentin (NEURONTIN) 400 MG capsule Take 1 capsule (400 mg total) by mouth 2 (two) times daily as needed (For pain). Patient not taking: Reported on 06/29/2022 05/26/22 06/25/22  Karie Fetch, MD  citalopram (CELEXA) 20 MG tablet Take 1 tablet (20 mg total) by mouth daily. 03/07/19 07/23/19  Aldean Baker, NP  lisinopril (ZESTRIL) 10 MG tablet Take 1 tablet (10 mg total) by mouth daily. 03/07/19 09/11/19  Aldean Baker, NP  mometasone-formoterol (DULERA) 200-5 MCG/ACT AERO Inhale 2 puffs into the lungs 2 (two) times daily. Patient not taking: Reported on 12/12/2021 07/09/21 12/15/21  Kathlen Mody, MD  pantoprazole (PROTONIX) 40 MG tablet Take 1 tablet (40 mg total) by mouth daily. 03/07/19 07/23/19  Aldean Baker, NP      Allergies    Patient has no known allergies.    Review  of Systems   Review of Systems  Respiratory:  Positive for cough and shortness of breath.   Cardiovascular:  Positive for chest pain.    Physical Exam Updated Vital Signs BP (!) 133/98   Pulse 90   Temp 97.7 F (36.5 C) (Oral)   Resp 18   Ht 5\' 6"  (1.676 m)   Wt 86.2 kg   LMP 04/19/2018   SpO2 100%   BMI 30.67 kg/m  Physical Exam Vitals and nursing note reviewed.  Constitutional:      General: She is in acute distress.     Appearance: She is well-developed. She  is not diaphoretic.  HENT:     Head: Normocephalic and atraumatic.     Right Ear: External ear normal.     Left Ear: External ear normal.     Nose: Nose normal.     Mouth/Throat:     Mouth: Mucous membranes are moist.  Eyes:     General: No scleral icterus.    Extraocular Movements: Extraocular movements intact.     Conjunctiva/sclera: Conjunctivae normal.     Pupils: Pupils are equal, round, and reactive to light.  Cardiovascular:     Rate and Rhythm: Normal rate and regular rhythm.     Heart sounds: Normal heart sounds. No murmur heard.    No friction rub. No gallop.  Pulmonary:     Effort: Tachypnea, prolonged expiration and respiratory distress present.     Breath sounds: No decreased air movement. Examination of the right-upper field reveals wheezing. Examination of the left-upper field reveals wheezing. Examination of the right-middle field reveals wheezing. Examination of the left-middle field reveals wheezing. Examination of the right-lower field reveals wheezing. Examination of the left-lower field reveals wheezing. Wheezing present.     Comments: Sitting in tripod position Abdominal:     General: Bowel sounds are normal. There is no distension.     Palpations: Abdomen is soft. There is no mass.     Tenderness: There is no abdominal tenderness. There is no guarding.  Musculoskeletal:     Cervical back: Normal range of motion.     Right lower leg: No edema.     Left lower leg: No edema.  Skin:    General: Skin is warm and dry.  Neurological:     Mental Status: She is alert and oriented to person, place, and time.  Psychiatric:        Behavior: Behavior normal.     ED Results / Procedures / Treatments   Labs (all labs ordered are listed, but only abnormal results are displayed) Labs Reviewed  COMPREHENSIVE METABOLIC PANEL - Abnormal; Notable for the following components:      Result Value   AST 14 (*)    All other components within normal limits  I-STAT VENOUS  BLOOD GAS, ED - Abnormal; Notable for the following components:   pO2, Ven 48 (*)    Bicarbonate 30.3 (*)    Acid-Base Excess 3.0 (*)    Potassium 5.6 (*)    Calcium, Ion 1.12 (*)    All other components within normal limits  RESP PANEL BY RT-PCR (RSV, FLU A&B, COVID)  RVPGX2  CBC  BRAIN NATRIURETIC PEPTIDE  MAGNESIUM  RAPID URINE DRUG SCREEN, HOSP PERFORMED  TROPONIN I (HIGH SENSITIVITY)  TROPONIN I (HIGH SENSITIVITY)    EKG None  Radiology DG Chest Portable 1 View  Result Date: 06/29/2022 CLINICAL DATA:  Acute onset shortness of breath and chest pain EXAM: PORTABLE CHEST 1  VIEW COMPARISON:  Chest radiograph dated 05/23/2022 FINDINGS: Normal lung volumes. No focal consolidations. No pleural effusion or pneumothorax. The heart size and mediastinal contours are within normal limits. No acute osseous abnormality. Partially imaged Harrington rod appears intact. IMPRESSION: No active disease. Electronically Signed   By: Agustin Cree M.D.   On: 06/29/2022 19:52    Procedures .Critical Care  Performed by: Arthor Captain, PA-C Authorized by: Arthor Captain, PA-C   Critical care provider statement:    Critical care time (minutes):  65   Critical care time was exclusive of:  Separately billable procedures and treating other patients   Critical care was necessary to treat or prevent imminent or life-threatening deterioration of the following conditions:  Respiratory failure   Critical care was time spent personally by me on the following activities:  Development of treatment plan with patient or surrogate, discussions with consultants, evaluation of patient's response to treatment, examination of patient, ordering and review of laboratory studies, ordering and review of radiographic studies, ordering and performing treatments and interventions, pulse oximetry, re-evaluation of patient's condition and review of old charts     Medications Ordered in ED Medications  ipratropium-albuterol  (DUONEB) 0.5-2.5 (3) MG/3ML nebulizer solution (0 mLs  Hold 06/29/22 1855)  ipratropium-albuterol (DUONEB) 0.5-2.5 (3) MG/3ML nebulizer solution 3 mL (3 mLs Nebulization Given 06/29/22 1835)  albuterol (PROVENTIL) (2.5 MG/3ML) 0.083% nebulizer solution 10 mg (10 mg Nebulization Given 06/29/22 1855)  magnesium sulfate IVPB 2 g 50 mL (0 g Intravenous Stopped 06/29/22 2014)  methylPREDNISolone sodium succinate (SOLU-MEDROL) 125 mg/2 mL injection 125 mg (125 mg Intravenous Given 06/29/22 1853)  albuterol (PROVENTIL) (2.5 MG/3ML) 0.083% nebulizer solution 2.5 mg (2.5 mg Nebulization Given 06/29/22 2105)    ED Course/ Medical Decision Making/ A&P Clinical Course as of 06/29/22 2337  Tue Jun 29, 2022  1922 DG Chest Portable 1 View I visualized and interpreted portable 1 view chest x-ray.  Patient appears to have hyperexpanded lungs without significant cardiomegaly or edema no evidence of pulmonary infiltrate on my interpretation.  Will defer to radiology [AH]    Clinical Course User Index [AH] Arthor Captain, PA-C                             Medical Decision Making Amount and/or Complexity of Data Reviewed Labs: ordered. Radiology: ordered. Decision-making details documented in ED Course. ECG/medicine tests: ordered.  Risk Prescription drug management.   This patient presents to the ED with chief complaint(s) of sob with pertinent past medical history of COPD, cocaine abuse which further complicates the presenting complaint. The complaint involves an extensive differential diagnosis and treatment options and also carries with it a high risk of complications and morbidity.    The differential diagnosis includes The emergent differential diagnosis for shortness of breath includes, but is not limited to, Pulmonary edema, bronchoconstriction, Pneumonia, Pulmonary embolism, Pneumotherax/ Hemothorax, Dysrythmia, ACS.     The initial plan is to order fluids, hour long neb, mag, Solu-Medrol, BiPAP,  labs, imaging respiratory distress  Additional Tests and treatment considered: I have low suspicion for pulmonary embolus as the underlying cause and do not feel that CT angiogram is necessary at this time.  Reassessment and review (also see workup area): Lab Tests: No elevated white blood cell count, negative respiratory panel, mag within normal limits, CMP shows no acute abnormalities, VBG shows compensated chronic respiratory acidosis I Ordered, and personally interpreted labs.  The pertinent results include: On initial  reassessment patient  Imaging Studies: I ordered and independently visualized and interpreted the following imaging X-ray chest   which showed no acute findings The interpretation of the imaging was limited to assessing for emergent pathology, for which purpose it was ordered.  Consultations Obtained: N/a  Medicines ordered and prescription drug management: I ordered the following medications multiple rounds of neb treatment Solu-Medrol, magnesium for COPD exacerbation I considered this additional medications: Antibiotics for pneumonia however she does not have evidence of such Reevaluation of the patient after these medicines showed that the patient    improved and was able to ambulate in the emergency department on her baseline liters of oxygen without hypoxia  Social Determinants of Health: SDOH Screenings   Food Insecurity: Food Insecurity Present (12/12/2021)  Housing: High Risk (12/12/2021)  Transportation Needs: Unmet Transportation Needs (12/12/2021)  Utilities: At Risk (12/12/2021)  Alcohol Screen: Low Risk  (03/28/2021)  Depression (PHQ2-9): Low Risk  (12/21/2021)  Tobacco Use: High Risk (06/29/2022)     Cardiac Monitoring: The patient was maintained on a cardiac monitor.  I personally viewed and interpreted the cardiac monitor which showed an underlying rhythm of:  sinus rhythm  Complexity of problems addressed: Patient's presentation is most consistent  with  acute presentation with potential threat to life or bodily function and exacerbation of chronic illness During patient's assessment  Disposition: After consideration of the diagnostic results and the patient's response to treatment,  I feel that the patent would benefit from discharge   .          Final Clinical Impression(s) / ED Diagnoses Final diagnoses:  COPD exacerbation (HCC)    Rx / DC Orders ED Discharge Orders          Ordered    methylPREDNISolone (MEDROL DOSEPAK) 4 MG TBPK tablet        06/29/22 2335    doxycycline (VIBRAMYCIN) 100 MG capsule  2 times daily        06/29/22 2335    budesonide-formoterol (SYMBICORT) 80-4.5 MCG/ACT inhaler  2 times daily        06/29/22 2335              Arthor Captain, PA-C 06/29/22 2341    Lorre Nick, MD 06/30/22 1013

## 2022-06-29 NOTE — ED Triage Notes (Addendum)
Pt arrives via POV. Pt reports chest pain and worsening sob that started about 30 mins ago. Pt reports productive cough since yesterday. Pt is AxOx4. Pt currently on her 2LNC, in a tripoding position, wheezing noted.

## 2022-06-30 ENCOUNTER — Other Ambulatory Visit (HOSPITAL_COMMUNITY): Payer: Self-pay

## 2022-07-15 ENCOUNTER — Other Ambulatory Visit (HOSPITAL_COMMUNITY): Payer: Self-pay

## 2022-07-21 ENCOUNTER — Other Ambulatory Visit (HOSPITAL_COMMUNITY): Payer: Self-pay

## 2022-07-30 ENCOUNTER — Other Ambulatory Visit (HOSPITAL_COMMUNITY): Payer: Self-pay

## 2022-08-03 ENCOUNTER — Other Ambulatory Visit (HOSPITAL_COMMUNITY): Payer: Self-pay

## 2022-08-04 ENCOUNTER — Observation Stay (HOSPITAL_COMMUNITY)
Admission: EM | Admit: 2022-08-04 | Discharge: 2022-08-07 | Disposition: A | Discharge status: Home / Self Care | Payer: MEDICAID | Attending: Internal Medicine | Admitting: Internal Medicine

## 2022-08-04 ENCOUNTER — Emergency Department (HOSPITAL_COMMUNITY): Payer: MEDICAID

## 2022-08-04 ENCOUNTER — Other Ambulatory Visit: Payer: Self-pay

## 2022-08-04 ENCOUNTER — Encounter (HOSPITAL_COMMUNITY): Payer: Self-pay | Admitting: *Deleted

## 2022-08-04 DIAGNOSIS — J441 Chronic obstructive pulmonary disease with (acute) exacerbation: Principal | ICD-10-CM | POA: Insufficient documentation

## 2022-08-04 DIAGNOSIS — F17210 Nicotine dependence, cigarettes, uncomplicated: Secondary | ICD-10-CM | POA: Diagnosis not present

## 2022-08-04 DIAGNOSIS — I10 Essential (primary) hypertension: Secondary | ICD-10-CM | POA: Diagnosis not present

## 2022-08-04 DIAGNOSIS — J9601 Acute respiratory failure with hypoxia: Secondary | ICD-10-CM | POA: Diagnosis not present

## 2022-08-04 DIAGNOSIS — M9689 Other intraoperative and postprocedural complications and disorders of the musculoskeletal system: Secondary | ICD-10-CM | POA: Insufficient documentation

## 2022-08-04 DIAGNOSIS — R0602 Shortness of breath: Secondary | ICD-10-CM | POA: Diagnosis present

## 2022-08-04 DIAGNOSIS — I159 Secondary hypertension, unspecified: Secondary | ICD-10-CM

## 2022-08-04 DIAGNOSIS — J45909 Unspecified asthma, uncomplicated: Secondary | ICD-10-CM | POA: Insufficient documentation

## 2022-08-04 DIAGNOSIS — Z79899 Other long term (current) drug therapy: Secondary | ICD-10-CM | POA: Diagnosis not present

## 2022-08-04 LAB — BASIC METABOLIC PANEL
Anion gap: 9 (ref 5–15)
BUN: 14 mg/dL (ref 6–20)
CO2: 26 mmol/L (ref 22–32)
Calcium: 9.3 mg/dL (ref 8.9–10.3)
Chloride: 105 mmol/L (ref 98–111)
Creatinine, Ser: 0.7 mg/dL (ref 0.44–1.00)
GFR, Estimated: >60 mL/min (ref >60)
Glucose, Bld: 114 mg/dL — ABNORMAL HIGH (ref 70–99)
Potassium: 3.9 mmol/L (ref 3.5–5.1)
Sodium: 140 mmol/L (ref 135–145)

## 2022-08-04 LAB — CBC WITH DIFFERENTIAL/PLATELET
Abs Immature Granulocytes: 0.01 10*3/uL (ref 0.00–0.07)
Basophils Absolute: 0 10*3/uL (ref 0.0–0.1)
Basophils Relative: 1 %
Eosinophils Absolute: 0.2 10*3/uL (ref 0.0–0.5)
Eosinophils Relative: 6 %
HCT: 39.3 % (ref 36.0–46.0)
Hemoglobin: 12.8 g/dL (ref 12.0–15.0)
Immature Granulocytes: 0 %
Lymphocytes Relative: 43 %
Lymphs Abs: 1.7 10*3/uL (ref 0.7–4.0)
MCH: 30.3 pg (ref 26.0–34.0)
MCHC: 32.6 g/dL (ref 30.0–36.0)
MCV: 93.1 fL (ref 80.0–100.0)
Monocytes Absolute: 0.5 10*3/uL (ref 0.1–1.0)
Monocytes Relative: 13 %
Neutro Abs: 1.5 10*3/uL — ABNORMAL LOW (ref 1.7–7.7)
Neutrophils Relative %: 37 %
Platelets: 201 10*3/uL (ref 150–400)
RBC: 4.22 MIL/uL (ref 3.87–5.11)
RDW: 14.8 % (ref 11.5–15.5)
WBC: 4 10*3/uL (ref 4.0–10.5)
nRBC: 0 % (ref 0.0–0.2)

## 2022-08-04 LAB — I-STAT VENOUS BLOOD GAS, ED
Acid-Base Excess: 3 mmol/L — ABNORMAL HIGH (ref 0.0–2.0)
Bicarbonate: 27.2 mmol/L (ref 20.0–28.0)
Calcium, Ion: 1.12 mmol/L — ABNORMAL LOW (ref 1.15–1.40)
HCT: 40 % (ref 36.0–46.0)
Hemoglobin: 13.6 g/dL (ref 12.0–15.0)
O2 Saturation: 90 %
Potassium: 4.1 mmol/L (ref 3.5–5.1)
Sodium: 141 mmol/L (ref 135–145)
TCO2: 28 mmol/L (ref 22–32)
pCO2, Ven: 39.9 mmHg — ABNORMAL LOW (ref 44–60)
pH, Ven: 7.442 — ABNORMAL HIGH (ref 7.25–7.43)
pO2, Ven: 56 mmHg — ABNORMAL HIGH (ref 32–45)

## 2022-08-04 MED ORDER — IPRATROPIUM BROMIDE 0.02 % IN SOLN
0.5000 mg | Freq: Once | RESPIRATORY_TRACT | Status: AC
  Administered 2022-08-04: 0.5 mg via RESPIRATORY_TRACT
  Filled 2022-08-04: qty 2.5

## 2022-08-04 MED ORDER — ALBUTEROL SULFATE (2.5 MG/3ML) 0.083% IN NEBU
15.0000 mg/h | INHALATION_SOLUTION | Freq: Once | RESPIRATORY_TRACT | Status: AC
  Administered 2022-08-04: 15 mg/h via RESPIRATORY_TRACT
  Filled 2022-08-04: qty 15
  Filled 2022-08-04: qty 18

## 2022-08-04 MED ORDER — MAGNESIUM SULFATE 2 GM/50ML IV SOLN
2.0000 g | Freq: Once | INTRAVENOUS | Status: AC
  Administered 2022-08-04: 2 g via INTRAVENOUS
  Filled 2022-08-04: qty 50

## 2022-08-04 MED ORDER — METHYLPREDNISOLONE SODIUM SUCC 125 MG IJ SOLR
125.0000 mg | Freq: Once | INTRAMUSCULAR | Status: AC
  Administered 2022-08-04: 125 mg via INTRAVENOUS
  Filled 2022-08-04: qty 2

## 2022-08-04 NOTE — ED Triage Notes (Signed)
The pt was given 0.3 epine I'm and  2 deo nebs the pt had made 3 attempts if by ems unxuccessful

## 2022-08-04 NOTE — ED Notes (Signed)
The pt was placed on bi-pap on arrival to the ed  tolerating it well thus far

## 2022-08-04 NOTE — Hospital Course (Addendum)
Summary: Rachel Nolan is a 52 y.o. with a history of COPD, anxiety, asthma, and hypertension. Patient was admitted for a COPD exacerbation.    #COPD Exacerbation  Multiple hospitalizations for similar COPD exacerbations. Per chart review, she was prescribed Anoro-ellipta upon her discharge in May, but she is currently on Symbicort. Inhaled Corticosteroids were not prescribed at her discharge because of barriers with her medical insurance. She has applied for medicaid multiple times and has not qualified.    Received solumedrol and BIPAP in ED.  Patient started on azithromycin 500 mg for 3 doses (7/18-7/20), prednisone 40 mg for 4 doses (7/18-7/21), Duoneb 0.5 mg-2.5 mg 3mL, and mometasone-formoterol 100-5 mcg 2 puffs BID. Patient was started on umeclidinium (LAMA) 1 puff daily on 7/18. Also added robitussin PRN for her intermittent cough, which was causing some chest pain. Her pulse ox ambulatory assessment showed patient removed from 2L O2 for 5 minutes and held 94% O2 saturation at rest. She was able to walk from her room (18) to room 23 and back and reported fatigue but her O2 stayed at 91 and above. Her heart rate increased to 124 and her breathing became heavier although sats were WNL after returning to her room. She is on 5L O2 at home and does not report any difficulties obtaining it. Does report living in a house with mold, which can be a trigger for her breathing. Added O2 parameters 88-92% O2 sat given COPD history.   #Asthma  Patient with diffuse wheezing both lungs. Continued montelukast 10 mg daily at bedtime, and added ALBUTEROL inhaler on 7/19.    #Scoliosis #Hx of Back Surgery  Patient continued on home dose of gabapentin 400 mg BID.    #Hypertension  Patient's blood pressures intermittently hypertensive and tachypneic on supplemental O2. Continued patient's home dose of amlodipine 10 mg daily. May need to consider logging blood pressures at home and adding another antihypertensive  agent in the outpatient setting.    #Anxiety/Depression  Patient was continued on her home dose of seroquel 100mg  daily at bedtime.   ------- Patient Instructions:   - You were seen for shortness of breath and found to have a Chronic Obstructive Pulmonary Disease (COPD) exacerbation. Your breathing has improved with the addition of an antibiotic, and oral and inhaled medications.   - Please continue to use the medications we have prescribed during your stay, including your oral steroid until it's finished, and your new inhalers ALBUTEROL, DULERA, and INCRUSE ELLIPTA. You can finish out the [facility] and go back to the [facility] inhaler you already have.    - Please continue to take your regular home medications as prescribed.   - Please go to your follow-up appointment at the Internal Medicine Clinic at [facility] (in the basement) on 08/20/22 @ 9:45 AM with Dr. Daiva Eves.   - Please call the Internal Medicine Clinic at [facility] during office hours (8:00 AM - 5 PM) at [phone] if you have any questions regarding your care. Please give [place] a call if your shortness of breath worsens despite inhaler use, you have a new fever, cough or chills. Please call 911 if you are having a medical emergency.   It was a pleasure serving you during your stay with [place]! - Dr. Justin Mend and team

## 2022-08-04 NOTE — ED Triage Notes (Signed)
The pt is c/o  sob  she lives on nasal 02 at 5 liters

## 2022-08-04 NOTE — ED Provider Notes (Signed)
[place] [facility]ERGENC[facility] DEPARTMENT AT [facility] Provider Note   CSN: [id number] Arrival date & time: 08/04/22  2121     History  Chief Complaint  Patient presents with   Respiratory Distress    Tunisha Ruland is a 52 y.o. female.  Patient is a 52 year old female with a history of COPD, hypertension, ongoing tobacco, marijuana and cocaine use who is presenting today with EMS for respiratory distress.  Over the last few days patient has had worsening shortness of breath but in the last few hours it has become severe.  She has had a cough but denies any sputum production.  No fevers.  She is having tightness in her chest.  She denies any abdominal pain nausea or vomiting.  No leg swelling.  She has been trying her inhalers at home but they have not been working.  When EMS arrived patient does wear 5 L of oxygen chronically but was distress.  They started her on 5 of albuterol and 0.5 of Atrovent but were unable to get an IV.  They do report some mild increase in breath sounds but persistent wheezing.  The history is provided by the patient, the EMS personnel and medical records.       Home Medications Prior to Admission medications   Medication Sig Start Date End Date Taking? Authorizing Provider  amLODipine (NORVASC) 5 MG tablet Take 2 tablets (10 mg total) by mouth daily. 05/26/22 06/29/22  Karie Fetch, MD  benzonatate (TESSALON) 100 MG capsule Take 1 capsule (100 mg total) by mouth 2 (two) times daily as needed for cough. 05/26/22   Karie Fetch, MD  budesonide-formoterol [facility]) 80-4.5 MCG/ACT inhaler Inhale 2 puffs into the lungs 2 (two) times daily. 06/29/22   Arthor Captain, PA-C  doxycycline (VIBRAMYCIN) 100 MG capsule Take 1 capsule (100 mg total) by mouth 2 (two) times daily. One po bid x 7 days 06/29/22   Arthor Captain, PA-C  gabapentin (NEURONTIN) 400 MG capsule Take 1 capsule (400 mg total) by mouth 2 (two) times daily as needed (For pain). Patient not  taking: Reported on 06/29/2022 05/26/22 06/25/22  Karie Fetch, MD  gabapentin (NEURONTIN) 600 MG tablet Take 600 mg by mouth 3 (three) times daily.    [provider]  hydrocortisone cream 1 % Apply to affected area 2 times daily Patient taking differently: Apply 1 Application topically daily as needed for itching. 05/26/22   Karie Fetch, MD  ipratropium-albuterol (DUONEB) 0.5-2.5 (3) MG/3ML SOLN Take 3 mLs by nebulization every 6 (six) hours as needed (Use FIRST). 05/26/22 06/29/22  Karie Fetch, MD  methylPREDNISolone (MEDROL DOSEPAK) 4 MG TBPK tablet Use as directed 06/29/22   Arthor Captain, PA-C  montelukast (SINGULAIR) 10 MG tablet Take 1 tablet (10 mg total) by mouth at bedtime. 05/26/22   Karie Fetch, MD  QUEtiapine (SEROQUEL) 100 MG tablet Take 1 tablet (100 mg total) by mouth at bedtime. 05/26/22 06/29/22  Karie Fetch, MD  citalopram (CELEXA) 20 MG tablet Take 1 tablet (20 mg total) by mouth daily. 03/07/19 07/23/19  Aldean Baker, NP  lisinopril (ZESTRIL) 10 MG tablet Take 1 tablet (10 mg total) by mouth daily. 03/07/19 09/11/19  Aldean Baker, NP  mometasone-formoterol (DULERA) 200-5 MCG/ACT AERO Inhale 2 puffs into the lungs 2 (two) times daily. Patient not taking: Reported on 12/12/2021 07/09/21 12/15/21  Kathlen Mody, MD  pantoprazole (PROTONIX) 40 MG tablet Take 1 tablet (40 mg total) by mouth daily. 03/07/19 07/23/19  Aldean Baker, NP  Allergies    Patient has no known allergies.    Review of Systems   Review of Systems  Physical Exam Updated Vital Signs BP (!) 161/103   Pulse 62   Resp 17   Ht 5\' 6"  (1.676 m)   Wt 86.2 kg   LMP 04/19/2018   SpO2 100%   BMI 30.67 kg/m  Physical Exam Vitals and nursing note reviewed.  Constitutional:      General: She is in acute distress.     Appearance: She is well-developed.  HENT:     Head: Normocephalic and atraumatic.  Eyes:     Pupils: Pupils are equal, round, and reactive to light.  Cardiovascular:      Rate and Rhythm: Normal rate and regular rhythm.     Pulses: Normal pulses.     Heart sounds: Normal heart sounds. No murmur heard.    No friction rub.  Pulmonary:     Effort: Tachypnea and respiratory distress present.     Breath sounds: Wheezing present. No rales.  Abdominal:     General: Bowel sounds are normal. There is no distension.     Palpations: Abdomen is soft.     Tenderness: There is no abdominal tenderness. There is no guarding or rebound.  Musculoskeletal:        General: No tenderness. Normal range of motion.     Right lower leg: No edema.     Left lower leg: No edema.     Comments: No edema  Skin:    General: Skin is warm and dry.     Findings: No rash.  Neurological:     Mental Status: She is alert and oriented to person, place, and time. Mental status is at baseline.     Cranial Nerves: No cranial nerve deficit.  Psychiatric:        Mood and Affect: Mood normal.        Behavior: Behavior normal.     ED Results / Procedures / Treatments   Labs (all labs ordered are listed, but only abnormal results are displayed) Labs Reviewed  CBC WITH DIFFERENTIAL/PLATELET - Abnormal; Notable for the following components:      Result Value   Neutro Abs 1.5 (*)    All other components within normal limits  BASIC METABOLIC PANEL - Abnormal; Notable for the following components:   Glucose, Bld 114 (*)    All other components within normal limits  I-STAT VENOUS BLOOD GAS, ED - Abnormal; Notable for the following components:   pH, Ven 7.442 (*)    pCO2, Ven 39.9 (*)    pO2, Ven 56 (*)    Acid-Base Excess 3.0 (*)    Calcium, Ion 1.12 (*)    All other components within normal limits    EKG EKG Interpretation Date/Time:  Wednesday August 04 2022 21:49:00 EDT Ventricular Rate:  71 PR Interval:  163 QRS Duration:  95 QT Interval:  418 QTC Calculation: 455 R Axis:   70  Text Interpretation: Sinus arrhythmia Probable left ventricular hypertrophy No significant change  since last tracing Confirmed by Gwyneth Sprout ([id number]) on 08/04/2022 10:34:49 PM  Radiology DG Chest Port 1 View  Result Date: 08/04/2022 CLINICAL DATA:  Dyspnea EXAM: PORTABLE CHEST 1 VIEW COMPARISON:  None Available. FINDINGS: Lungs are clear. No pneumothorax or pleural effusion. Cardiac size within normal limits. Pulmonary vascularity is normal. Stable thoracic sigmoid scoliosis with partially visualized right paraspinal Harrington rod again identified. No acute bone abnormality. IMPRESSION: 1. No active  disease. Electronically Signed   By: Helyn Numbers M.D.   On: 08/04/2022 22:06    Procedures Procedures    Medications Ordered in ED Medications  albuterol (PROVENTIL) (2.5 MG/3ML) 0.083% nebulizer solution (15 mg/hr Nebulization Given 08/04/22 2150)  ipratropium (ATROVENT) nebulizer solution 0.5 mg (0.5 mg Nebulization Given 08/04/22 2151)  methylPREDNISolone sodium succinate (SOLU-MEDROL) 125 mg/2 mL injection 125 mg (125 mg Intravenous Given 08/04/22 2157)  magnesium sulfate IVPB 2 g 50 mL (2 g Intravenous New Bag/Given 08/04/22 2205)    ED Course/ Medical Decision Making/ A&P                             Medical Decision Making Amount and/or Complexity of Data Reviewed External Data Reviewed: notes. Labs: ordered. Decision-making details documented in ED Course. Radiology: ordered and independent interpretation performed. Decision-making details documented in ED Course. ECG/medicine tests: ordered and independent interpretation performed. Decision-making details documented in ED Course.  Risk Prescription drug management. Decision regarding hospitalization.   Pt with multiple medical problems and comorbidities and presenting today with a complaint that caries a high risk for morbidity and mortality.  Here today with respiratory distress with generalized wheezing.  Suspect COPD exacerbation.  Lower suspicion for pneumonia, PE, acute cardiac pathology.  I independently  interpreted patient's EKG and labs.  EKG without acute findings, CBC, BMP, VBG all stable. I have independently visualized and interpreted pt's images today.  CXR wnl.  Upon arrival here because of patient's respiratory distress she was started on BiPAP given an hour-long continuous neb and Atrovent.  She was also given Solu-Medrol, magnesium.  On reevaluation patient is resting more comfortably.  She is still having diffuse wheezing but at this time is in no respiratory distress.  Will try to wean BiPAP.  Will place back on her home 5 L however with ongoing wheezing feel that she is still needs admission for COPD exacerbation.  Findings discussed with the patient.  She is comfortable with this plan will consult hospitalist for admission.  CRITICAL CARE Performed by: Karsten Howry Total critical care time: 30 minutes Critical care time was exclusive of separately billable procedures and treating other patients. Critical care was necessary to treat or prevent imminent or life-threatening deterioration. Critical care was time spent personally by me on the following activities: development of treatment plan with patient and/or surrogate as well as nursing, discussions with consultants, evaluation of patient's response to treatment, examination of patient, obtaining history from patient or surrogate, ordering and performing treatments and interventions, ordering and review of laboratory studies, ordering and review of radiographic studies, pulse oximetry and re-evaluation of patient's condition.           Final Clinical Impression(s) / ED Diagnoses Final diagnoses:  COPD exacerbation (HCC)  Acute respiratory failure with hypoxia [facility])    Rx / DC Orders ED Discharge Orders     None         Gwyneth Sprout, MD 08/04/22 2333

## 2022-08-04 NOTE — ED Notes (Signed)
Pure wick  offered the pt does not want it

## 2022-08-05 ENCOUNTER — Other Ambulatory Visit (HOSPITAL_COMMUNITY): Payer: Self-pay

## 2022-08-05 DIAGNOSIS — F419 Anxiety disorder, unspecified: Secondary | ICD-10-CM | POA: Diagnosis not present

## 2022-08-05 DIAGNOSIS — F17210 Nicotine dependence, cigarettes, uncomplicated: Secondary | ICD-10-CM

## 2022-08-05 DIAGNOSIS — I10 Essential (primary) hypertension: Secondary | ICD-10-CM

## 2022-08-05 DIAGNOSIS — F32A Depression, unspecified: Secondary | ICD-10-CM

## 2022-08-05 DIAGNOSIS — M419 Scoliosis, unspecified: Secondary | ICD-10-CM | POA: Diagnosis not present

## 2022-08-05 DIAGNOSIS — J441 Chronic obstructive pulmonary disease with (acute) exacerbation: Secondary | ICD-10-CM | POA: Diagnosis not present

## 2022-08-05 LAB — CBC
HCT: 40.8 % (ref 36.0–46.0)
Hemoglobin: 13.3 g/dL (ref 12.0–15.0)
MCH: 30.4 pg (ref 26.0–34.0)
MCHC: 32.6 g/dL (ref 30.0–36.0)
MCV: 93.2 fL (ref 80.0–100.0)
Platelets: 189 10*3/uL (ref 150–400)
RBC: 4.38 MIL/uL (ref 3.87–5.11)
RDW: 14.8 % (ref 11.5–15.5)
WBC: 4.2 10*3/uL (ref 4.0–10.5)
nRBC: 0 % (ref 0.0–0.2)

## 2022-08-05 LAB — BASIC METABOLIC PANEL
Anion gap: 9 (ref 5–15)
BUN: 13 mg/dL (ref 6–20)
CO2: 24 mmol/L (ref 22–32)
Calcium: 8.9 mg/dL (ref 8.9–10.3)
Chloride: 102 mmol/L (ref 98–111)
Creatinine, Ser: 0.72 mg/dL (ref 0.44–1.00)
GFR, Estimated: >60 mL/min (ref >60)
Glucose, Bld: 170 mg/dL — ABNORMAL HIGH (ref 70–99)
Potassium: 3.5 mmol/L (ref 3.5–5.1)
Sodium: 135 mmol/L (ref 135–145)

## 2022-08-05 LAB — HEMOGLOBIN A1C
Hgb A1c MFr Bld: 5.5 % (ref 4.8–5.6)
Mean Plasma Glucose: 111.15 mg/dL

## 2022-08-05 LAB — GLUCOSE, CAPILLARY
Glucose-Capillary: 122 mg/dL — ABNORMAL HIGH (ref 70–99)
Glucose-Capillary: 145 mg/dL — ABNORMAL HIGH (ref 70–99)

## 2022-08-05 LAB — MAGNESIUM: Magnesium: 2.4 mg/dL (ref 1.7–2.4)

## 2022-08-05 MED ORDER — AMLODIPINE BESYLATE 10 MG PO TABS
10.0000 mg | ORAL_TABLET | Freq: Every day | ORAL | Status: DC
  Administered 2022-08-05 – 2022-08-07 (×3): 10 mg via ORAL
  Filled 2022-08-05 (×2): qty 1
  Filled 2022-08-05: qty 2

## 2022-08-05 MED ORDER — MOMETASONE FURO-FORMOTEROL FUM 100-5 MCG/ACT IN AERO
2.0000 | INHALATION_SPRAY | Freq: Two times a day (BID) | RESPIRATORY_TRACT | Status: DC
  Administered 2022-08-05 – 2022-08-06 (×4): 2 via RESPIRATORY_TRACT
  Filled 2022-08-05 (×4): qty 8.8

## 2022-08-05 MED ORDER — AZITHROMYCIN 250 MG PO TABS
500.0000 mg | ORAL_TABLET | Freq: Every day | ORAL | Status: AC
  Administered 2022-08-05 – 2022-08-07 (×3): 500 mg via ORAL
  Filled 2022-08-05 (×3): qty 2

## 2022-08-05 MED ORDER — IPRATROPIUM-ALBUTEROL 0.5-2.5 (3) MG/3ML IN SOLN
3.0000 mL | RESPIRATORY_TRACT | Status: DC
  Administered 2022-08-05 (×3): 3 mL via RESPIRATORY_TRACT
  Filled 2022-08-05 (×4): qty 3

## 2022-08-05 MED ORDER — INSULIN ASPART 100 UNIT/ML IJ SOLN: 0.0000 [IU] | Freq: Every day | INTRAMUSCULAR | Status: DC

## 2022-08-05 MED ORDER — MONTELUKAST SODIUM 10 MG PO TABS
10.0000 mg | ORAL_TABLET | Freq: Every day | ORAL | Status: DC
  Administered 2022-08-05 – 2022-08-06 (×2): 10 mg via ORAL
  Filled 2022-08-05 (×2): qty 1

## 2022-08-05 MED ORDER — QUETIAPINE FUMARATE 100 MG PO TABS
100.0000 mg | ORAL_TABLET | Freq: Every day | ORAL | Status: DC
  Administered 2022-08-05 – 2022-08-06 (×3): 100 mg via ORAL
  Filled 2022-08-05: qty 4
  Filled 2022-08-05 (×2): qty 1

## 2022-08-05 MED ORDER — PREDNISONE 20 MG PO TABS
40.0000 mg | ORAL_TABLET | Freq: Every day | ORAL | Status: DC
  Administered 2022-08-05 – 2022-08-07 (×3): 40 mg via ORAL
  Filled 2022-08-05 (×4): qty 2

## 2022-08-05 MED ORDER — GABAPENTIN 400 MG PO CAPS
400.0000 mg | ORAL_CAPSULE | Freq: Two times a day (BID) | ORAL | Status: DC | PRN
  Administered 2022-08-05: 400 mg via ORAL
  Filled 2022-08-05: qty 1

## 2022-08-05 MED ORDER — INSULIN ASPART 100 UNIT/ML IJ SOLN
0.0000 [IU] | Freq: Three times a day (TID) | INTRAMUSCULAR | Status: DC
  Administered 2022-08-05 – 2022-08-06 (×3): 2 [IU] via SUBCUTANEOUS
  Administered 2022-08-07: 5 [IU] via SUBCUTANEOUS

## 2022-08-05 MED ORDER — UMECLIDINIUM BROMIDE 62.5 MCG/ACT IN AEPB
1.0000 | INHALATION_SPRAY | Freq: Every day | RESPIRATORY_TRACT | Status: DC
  Administered 2022-08-06: 1 via RESPIRATORY_TRACT
  Filled 2022-08-05 (×2): qty 7

## 2022-08-05 MED ORDER — ENOXAPARIN SODIUM 40 MG/0.4ML IJ SOSY
40.0000 mg | PREFILLED_SYRINGE | INTRAMUSCULAR | Status: DC
  Administered 2022-08-05 – 2022-08-07 (×3): 40 mg via SUBCUTANEOUS
  Filled 2022-08-05 (×3): qty 0.4

## 2022-08-05 MED ORDER — IPRATROPIUM-ALBUTEROL 0.5-2.5 (3) MG/3ML IN SOLN
3.0000 mL | Freq: Two times a day (BID) | RESPIRATORY_TRACT | Status: DC
  Administered 2022-08-06 (×2): 3 mL via RESPIRATORY_TRACT
  Filled 2022-08-05 (×2): qty 3

## 2022-08-05 NOTE — ED Notes (Signed)
OT / PT at bedside. 

## 2022-08-05 NOTE — Progress Notes (Signed)
PT Cancellation Note  Patient Details Name: Rachel Nolan MRN: [id number] DOB: March 20, 1970   Cancelled Treatment:    Reason Eval/Treat Not Completed: PT screened, no needs identified, will sign off (Per OT, pt is at baseline.  No PT needs, will sign off.)   Bevelyn Buckles 08/05/2022, 10:55 AM .Shyrl Numbers

## 2022-08-05 NOTE — ED Notes (Signed)
ED TO INPATIENT HANDOFF REPORT  ED Nurse Name and Phone #: Sebastiano Luecke [phone]  S Name/Age/Gender Rachel Nolan 52 y.o. female Room/Bed: 046C/046C  Code Status   Code Status: DNR  Home/SNF/Other Home Patient oriented to: self, place, time, and situation Is this baseline? Yes   Triage Complete: Triage complete  Chief Complaint COPD exacerbation (HCC) [J44.1]  Triage Note The pt is c/o  sob  she lives on nasal 02 at 5 liters  The pt was given 0.3 epine I'm and  2 deo nebs the pt had made 3 attempts if by ems unxuccessful   Allergies No Known Allergies  Level of Care/Admitting Diagnosis ED Disposition     ED Disposition  Admit   Condition  --   Comment  Hospital Area: [facility] [[id number]]  Level of Care: Med-Surg [16]  May place patient in observation at [facility] or [facility] if equivalent level of care is available:: No  Covid Evaluation: Asymptomatic - no recent exposure (last 10 days) testing not required  Diagnosis: COPD exacerbation [facility]) [[id number]]  Admitting Physician: Reymundo Poll [[id number]]  Attending Physician: Arnetha Courser          B Medical/Surgery History Past Medical History:  Diagnosis Date   Anxiety    Asthma    COPD (chronic obstructive pulmonary disease) (HCC)    Depression    Hypertension    Scoliosis    Past Surgical History:  Procedure Laterality Date   BACK SURGERY     ECTOPIC PREGNANCY SURGERY       A IV Location/Drains/Wounds Patient Lines/Drains/Airways Status     Active Line/Drains/Airways     Name Placement date Placement time Site Days   Peripheral IV 08/04/22 22 G 1" Right Other (Comment) 08/04/22  2154  Other (Comment)  1            Intake/Output Last 24 hours No intake or output data in the 24 hours ending 08/05/22 1315  Labs/Imaging Results for orders placed or performed during the hospital encounter of 08/04/22 (from the past 48 hour(s))  I-Stat venous blood gas,  ED     Status: Abnormal   Collection Time: 08/04/22  9:46 PM  Result Value Ref Range   pH, Ven 7.442 (H) 7.25 - 7.43   pCO2, Ven 39.9 (L) 44 - 60 mmHg   pO2, Ven 56 (H) 32 - 45 mmHg   Bicarbonate 27.2 20.0 - 28.0 mmol/L   TCO2 28 22 - 32 mmol/L   O2 Saturation 90 %   Acid-Base Excess 3.0 (H) 0.0 - 2.0 mmol/L   Sodium 141 135 - 145 mmol/L   Potassium 4.1 3.5 - 5.1 mmol/L   Calcium, Ion 1.12 (L) 1.15 - 1.40 mmol/L   HCT 40.0 36.0 - 46.0 %   Hemoglobin 13.6 12.0 - 15.0 g/dL   Sample type VENOUS   CBC with Differential/Platelet     Status: Abnormal   Collection Time: 08/04/22  9:56 PM  Result Value Ref Range   WBC 4.0 4.0 - 10.5 K/uL   RBC 4.22 3.87 - 5.11 MIL/uL   Hemoglobin 12.8 12.0 - 15.0 g/dL   HCT 52.8 41.3 - 24.4 %   MCV 93.1 80.0 - 100.0 fL   MCH 30.3 26.0 - 34.0 pg   MCHC 32.6 30.0 - 36.0 g/dL   RDW 01.0 27.2 - 53.6 %   Platelets 201 150 - 400 K/uL   nRBC 0.0 0.0 - 0.2 %   Neutrophils Relative %  37 %   Neutro Abs 1.5 (L) 1.7 - 7.7 K/uL   Lymphocytes Relative 43 %   Lymphs Abs 1.7 0.7 - 4.0 K/uL   Monocytes Relative 13 %   Monocytes Absolute 0.5 0.1 - 1.0 K/uL   Eosinophils Relative 6 %   Eosinophils Absolute 0.2 0.0 - 0.5 K/uL   Basophils Relative 1 %   Basophils Absolute 0.0 0.0 - 0.1 K/uL   Immature Granulocytes 0 %   Abs Immature Granulocytes 0.01 0.00 - 0.07 K/uL    Comment: Performed at [facility], [address] [place], [place] [address]  Basic metabolic panel     Status: Abnormal   Collection Time: 08/04/22 10:00 PM  Result Value Ref Range   Sodium 140 135 - 145 mmol/L   Potassium 3.9 3.5 - 5.1 mmol/L   Chloride 105 98 - 111 mmol/L   CO2 26 22 - 32 mmol/L   Glucose, Bld 114 (H) 70 - 99 mg/dL    Comment: Glucose reference range applies only to samples taken after fasting for at least 8 hours.   BUN 14 6 - 20 mg/dL   Creatinine, Ser 7.12 0.44 - 1.00 mg/dL   Calcium 9.3 8.9 - 45.8 mg/dL   GFR, Estimated >09 >98 mL/min    Comment:  (NOTE) Calculated using the CKD-EPI Creatinine Equation (2021)    Anion gap 9 5 - 15    Comment: Performed at [facility], [address] [place], [place] [address]  CBC     Status: None   Collection Time: 08/05/22  3:08 AM  Result Value Ref Range   WBC 4.2 4.0 - 10.5 K/uL   RBC 4.38 3.87 - 5.11 MIL/uL   Hemoglobin 13.3 12.0 - 15.0 g/dL   HCT 05.3 97.6 - 73.4 %   MCV 93.2 80.0 - 100.0 fL   MCH 30.4 26.0 - 34.0 pg   MCHC 32.6 30.0 - 36.0 g/dL   RDW 19.3 79.0 - 24.0 %   Platelets 189 150 - 400 K/uL   nRBC 0.0 0.0 - 0.2 %    Comment: Performed at [facility], [address] [place], [place] [address]  Basic metabolic panel     Status: Abnormal   Collection Time: 08/05/22  3:08 AM  Result Value Ref Range   Sodium 135 135 - 145 mmol/L   Potassium 3.5 3.5 - 5.1 mmol/L   Chloride 102 98 - 111 mmol/L   CO2 24 22 - 32 mmol/L   Glucose, Bld 170 (H) 70 - 99 mg/dL    Comment: Glucose reference range applies only to samples taken after fasting for at least 8 hours.   BUN 13 6 - 20 mg/dL   Creatinine, Ser 2.99 0.44 - 1.00 mg/dL   Calcium 8.9 8.9 - 24.2 mg/dL   GFR, Estimated >68 >34 mL/min    Comment: (NOTE) Calculated using the CKD-EPI Creatinine Equation (2021)    Anion gap 9 5 - 15    Comment: Performed at [facility], [address], [place], [place] [address]  Magnesium     Status: None   Collection Time: 08/05/22  3:08 AM  Result Value Ref Range   Magnesium 2.4 1.7 - 2.4 mg/dL    Comment: Performed at [facility], [address] [place], [place] [address]   DG Chest Port 1 View  Result Date: 08/04/2022 CLINICAL DATA:  Dyspnea EXAM: PORTABLE CHEST 1 VIEW COMPARISON:  None  Available. FINDINGS: Lungs are clear. No pneumothorax or pleural effusion. Cardiac size within normal limits. Pulmonary vascularity is normal. Stable thoracic sigmoid scoliosis with partially visualized right paraspinal Harrington rod again identified. No acute bone abnormality.  IMPRESSION: 1. No active disease. Electronically Signed   By: Helyn Numbers M.D.   On: 08/04/2022 22:06    Pending Labs Unresulted Labs (From admission, onward)    None       Vitals/Pain Today's Vitals   08/05/22 0900 08/05/22 0934 08/05/22 1100 08/05/22 1303  BP: 122/79  111/70   Pulse: 65  78   Resp: 20 (!) 24 18   Temp: 98 F (36.7 C)   98.2 F (36.8 C)  TempSrc: Oral   Oral  SpO2: 100%  100%   Weight:      Height:      PainSc: 0-No pain       Isolation Precautions No active isolations  Medications Medications  enoxaparin (LOVENOX) injection 40 mg (40 mg Subcutaneous Given 08/05/22 0913)  azithromycin (ZITHROMAX) tablet 500 mg (500 mg Oral Given 08/05/22 0241)  amLODipine (NORVASC) tablet 10 mg (10 mg Oral Given 08/05/22 0910)  mometasone-formoterol (DULERA) 100-5 MCG/ACT inhaler 2 puff (2 puffs Inhalation Given 08/05/22 0940)  gabapentin (NEURONTIN) capsule 400 mg (has no administration in time range)  montelukast (SINGULAIR) tablet 10 mg (has no administration in time range)  QUEtiapine (SEROQUEL) tablet 100 mg (100 mg Oral Given 08/05/22 0241)  ipratropium-albuterol (DUONEB) 0.5-2.5 (3) MG/3ML nebulizer solution 3 mL (0 mLs Nebulization Hold 08/05/22 1115)  predniSONE (DELTASONE) tablet 40 mg (40 mg Oral Given 08/05/22 0909)  albuterol (PROVENTIL) (2.5 MG/3ML) 0.083% nebulizer solution (15 mg/hr Nebulization Given 08/04/22 2150)  ipratropium (ATROVENT) nebulizer solution 0.5 mg (0.5 mg Nebulization Given 08/04/22 2151)  methylPREDNISolone sodium succinate (SOLU-MEDROL) 125 mg/2 mL injection 125 mg (125 mg Intravenous Given 08/04/22 2157)  magnesium sulfate IVPB 2 g 50 mL (0 g Intravenous Stopped 08/04/22 2305)    Mobility walks     Focused Assessments    R Recommendations: See Admitting Provider Note  Report given to:   Additional Notes:

## 2022-08-05 NOTE — Progress Notes (Signed)
Summary: Rachel Nolan is a 52 y.o. with a history of COPD, anxiety, asthma, and hypertension. She presented with dyspnea, tachypnea, and reported fever of 102. Triggers include heat/temperature. EKG did not show signs of acute cardiac pathology. Her Wells criteria score on admission was zero. She improved in the ED after being given albuterol, ipratropium, and solumedrol. Patient was admitted for a COPD exacerbation.   Subjective:  No overnight events. Patient appears warm with increased respiratory effort with O2 via [place] in place. Denies any CP, headaches, N/V, dysuria, or recent BM. Last BM last Sunday. Still feeling shortness of breath, on 4L, satting 99%, RR 22. Ordered O2 parameters in room.   Patient reported having limited access to mediations due to insurance and cost. Patient does have insurance but very high co-pays and deductible. Said she usually gets medications following hospitalizations. Reported last use of cocaine as of 2 days ago.   Objective:  Vital signs in last 24 hours: Vitals:   08/05/22 0900 08/05/22 0934 08/05/22 1100 08/05/22 1303  BP: 122/79  111/70   Pulse: 65  78   Resp: 20 (!) 24 18   Temp: 98 F (36.7 C)   98.2 F (36.8 C)  TempSrc: Oral   Oral  SpO2: 100%  100%   Weight:      Height:          Latest Ref Rng & Units 08/05/2022    3:08 AM 08/04/2022    9:56 PM 08/04/2022    9:46 PM  CBC  WBC 4.0 - 10.5 K/uL 4.2  4.0    Hemoglobin 12.0 - 15.0 g/dL 21.3  08.6  57.8   Hematocrit 36.0 - 46.0 % 40.8  39.3  40.0   Platelets 150 - 400 K/uL 189  201         Latest Ref Rng & Units 08/05/2022    3:08 AM 08/04/2022   10:00 PM 08/04/2022    9:46 PM  CMP  Glucose 70 - 99 mg/dL 469  629    BUN 6 - 20 mg/dL 13  14    Creatinine 5.28 - 1.00 mg/dL 4.13  2.44    Sodium 010 - 145 mmol/L 135  140  141   Potassium 3.5 - 5.1 mmol/L 3.5  3.9  4.1   Chloride 98 - 111 mmol/L 102  105    CO2 22 - 32 mmol/L 24  26    Calcium 8.9 - 10.3 mg/dL 8.9  9.3      No  intake or output data in the 24 hours ending 08/05/22 1620   Physical Exam Constitutional: Patient is in bed with increased respiratory effort on 4L O2 via [place].  CV: Regular rate and rhythm without murmurs on auscultation. No LE edema.  Pulmonary/Respiratory: Diffuse wheezing throughout, clear lungs bilaterally.  Abdominal: Soft, non-distended, non-tender, positive bowel sounds.  Skin: Warm and dry. Psych: Normal mood and affect.   Assessment/Plan:  Principal Problem:   COPD exacerbation (HCC)  Rachel Nolan is a 52 y.o. with a history of COPD, anxiety, asthma, and hypertension. Patient was admitted for a COPD exacerbation.   Multiple hospitalizations for similar COPD exacerbations. Per chart review, she was prescribed Anuro-ellipta upon her discharge in May, but she is currently on Symbicort. ICS were not prescribed at her discharge because of barriers with insurance. She has applied for medicaid multiple times and has not qualified. She is on 5L O2 at home. Added O2 parameters 88-92% O2 sat given COPD history.  Received solumedrol and BIPAP in ED.  Patient started on azithromycin 500 mg, prednisone 40 mg, duoneb, and mometasone-formoterol.    - Azithromycin 500 mg (7/18-7/20, 3 doses) - Prednisone 40 mg (7/18-7/21),4 doses - Duoneb 0.5 mg-2.5mg  3mL - mometasone-formoterol 100-5 mcg 2 puffs BID - START umeclindinium bromide (LAMA) 1 puff daily  - f/u pulse ox ambulatory assessment   #Asthma  -Continue montelukast 10mg  Daily at bedtime    #Scoliosis #Hx of Back Surgery  - Continue home dose of gabapentin 400mg  BID     #Hypertension  BP intermittently hypertensive and tachypneic on 4-5 L O2.  - Continue with home dose of amlodipine 10mg  daily - Monitor BP    #Anxiety/Depression  - Continue home dose of seroquel 100mg  daily at bedtime    Diet: Normal VTE: Enoxaparin IVF: None,None Code: DNR  Prior to Admission Living Arrangement: Living with sister Anticipated  Discharge Location: Home with sister Barriers to Discharge: Medical management Dispo: Anticipated discharge in approximately 1 day(s).   Philomena Doheny, MD, PGY-1 08/05/2022, 1:36 PM Pager: @MYPAGER @ After 5pm on weekdays and 1pm on weekends: On Call pager [phone]

## 2022-08-05 NOTE — ED Notes (Signed)
ED TO INPATIENT HANDOFF REPORT  ED Nurse Name and Phone #: chris [phone]   S Name/Age/Gender Rachel Nolan 52 y.o. female Room/Bed: TRAAC/TRAAC  Code Status   Code Status: DNR  Home/SNF/Other Home Patient oriented to: self, place, time, and situation Is this baseline? Yes   Triage Complete: Triage complete  Chief Complaint COPD exacerbation (HCC) [J44.1]  Triage Note The pt is c/o  sob  she lives on nasal 02 at 5 liters  The pt was given 0.3 epine I'm and  2 deo nebs the pt had made 3 attempts if by ems unxuccessful   Allergies No Known Allergies  Level of Care/Admitting Diagnosis ED Disposition     ED Disposition  Admit   Condition  --   Comment  Hospital Area: [facility] [[id number]]  Level of Care: Med-Surg [16]  May place patient in observation at [facility] or [facility] if equivalent level of care is available:: No  Covid Evaluation: Asymptomatic - no recent exposure (last 10 days) testing not required  Diagnosis: COPD exacerbation [facility]) [[id number]]  Admitting Physician: Reymundo Poll [[id number]]  Attending Physician: Arnetha Courser          B Medical/Surgery History Past Medical History:  Diagnosis Date   Anxiety    Asthma    COPD (chronic obstructive pulmonary disease) (HCC)    Depression    Hypertension    Scoliosis    Past Surgical History:  Procedure Laterality Date   BACK SURGERY     ECTOPIC PREGNANCY SURGERY       A IV Location/Drains/Wounds Patient Lines/Drains/Airways Status     Active Line/Drains/Airways     Name Placement date Placement time Site Days   Peripheral IV 08/04/22 22 G 1" Right Other (Comment) 08/04/22  2154  Other (Comment)  1            Intake/Output Last 24 hours No intake or output data in the 24 hours ending 08/05/22 0221  Labs/Imaging Results for orders placed or performed during the hospital encounter of 08/04/22 (from the past 48 hour(s))  I-Stat venous blood gas,  ED     Status: Abnormal   Collection Time: 08/04/22  9:46 PM  Result Value Ref Range   pH, Ven 7.442 (H) 7.25 - 7.43   pCO2, Ven 39.9 (L) 44 - 60 mmHg   pO2, Ven 56 (H) 32 - 45 mmHg   Bicarbonate 27.2 20.0 - 28.0 mmol/L   TCO2 28 22 - 32 mmol/L   O2 Saturation 90 %   Acid-Base Excess 3.0 (H) 0.0 - 2.0 mmol/L   Sodium 141 135 - 145 mmol/L   Potassium 4.1 3.5 - 5.1 mmol/L   Calcium, Ion 1.12 (L) 1.15 - 1.40 mmol/L   HCT 40.0 36.0 - 46.0 %   Hemoglobin 13.6 12.0 - 15.0 g/dL   Sample type VENOUS   CBC with Differential/Platelet     Status: Abnormal   Collection Time: 08/04/22  9:56 PM  Result Value Ref Range   WBC 4.0 4.0 - 10.5 K/uL   RBC 4.22 3.87 - 5.11 MIL/uL   Hemoglobin 12.8 12.0 - 15.0 g/dL   HCT 47.8 29.5 - 62.1 %   MCV 93.1 80.0 - 100.0 fL   MCH 30.3 26.0 - 34.0 pg   MCHC 32.6 30.0 - 36.0 g/dL   RDW 30.8 65.7 - 84.6 %   Platelets 201 150 - 400 K/uL   nRBC 0.0 0.0 - 0.2 %   Neutrophils Relative %  37 %   Neutro Abs 1.5 (L) 1.7 - 7.7 K/uL   Lymphocytes Relative 43 %   Lymphs Abs 1.7 0.7 - 4.0 K/uL   Monocytes Relative 13 %   Monocytes Absolute 0.5 0.1 - 1.0 K/uL   Eosinophils Relative 6 %   Eosinophils Absolute 0.2 0.0 - 0.5 K/uL   Basophils Relative 1 %   Basophils Absolute 0.0 0.0 - 0.1 K/uL   Immature Granulocytes 0 %   Abs Immature Granulocytes 0.01 0.00 - 0.07 K/uL    Comment: Performed at [facility], [address], [place], [place] [address]  Basic metabolic panel     Status: Abnormal   Collection Time: 08/04/22 10:00 PM  Result Value Ref Range   Sodium 140 135 - 145 mmol/L   Potassium 3.9 3.5 - 5.1 mmol/L   Chloride 105 98 - 111 mmol/L   CO2 26 22 - 32 mmol/L   Glucose, Bld 114 (H) 70 - 99 mg/dL    Comment: Glucose reference range applies only to samples taken after fasting for at least 8 hours.   BUN 14 6 - 20 mg/dL   Creatinine, Ser 6.04 0.44 - 1.00 mg/dL   Calcium 9.3 8.9 - 54.0 mg/dL   GFR, Estimated >98 >11 mL/min    Comment:  (NOTE) Calculated using the CKD-EPI Creatinine Equation (2021)    Anion gap 9 5 - 15    Comment: Performed at [facility], [address] [place], [place] [address]   DG Chest Port 1 View  Result Date: 08/04/2022 CLINICAL DATA:  Dyspnea EXAM: PORTABLE CHEST 1 VIEW COMPARISON:  None Available. FINDINGS: Lungs are clear. No pneumothorax or pleural effusion. Cardiac size within normal limits. Pulmonary vascularity is normal. Stable thoracic sigmoid scoliosis with partially visualized right paraspinal Harrington rod again identified. No acute bone abnormality. IMPRESSION: 1. No active disease. Electronically Signed   By: Helyn Numbers M.D.   On: 08/04/2022 22:06    Pending Labs Unresulted Labs (From admission, onward)     Start     Ordered   08/05/22 0500  CBC  Tomorrow morning,   R        08/05/22 0139   08/05/22 0500  Basic metabolic panel  Tomorrow morning,   R        08/05/22 0139   08/05/22 0500  Magnesium  Tomorrow morning,   R        08/05/22 0139            Vitals/Pain Today's Vitals   08/04/22 2206 08/04/22 2230 08/05/22 0042 08/05/22 0200  BP:  (!) 161/103    Pulse: 60 62    Resp: (!) 21 17  17   SpO2: 100% 100%    Weight:      Height:      PainSc:   0-No pain     Isolation Precautions No active isolations  Medications Medications  enoxaparin (LOVENOX) injection 40 mg (has no administration in time range)  azithromycin (ZITHROMAX) tablet 500 mg (has no administration in time range)  amLODipine (NORVASC) tablet 10 mg (has no administration in time range)  mometasone-formoterol (DULERA) 100-5 MCG/ACT inhaler 2 puff (has no administration in time range)  gabapentin (NEURONTIN) capsule 400 mg (has no administration in time range)  montelukast (SINGULAIR) tablet 10 mg (has no administration in time range)  QUEtiapine (SEROQUEL) tablet 100 mg (has no administration in time range)  ipratropium-albuterol (DUONEB) 0.5-2.5 (3) MG/3ML nebulizer solution 3 mL (has  no administration in time range)  predniSONE (DELTASONE) tablet 40 mg (has no administration in time range)  albuterol (PROVENTIL) (2.5 MG/3ML) 0.083% nebulizer solution (15 mg/hr Nebulization Given 08/04/22 2150)  ipratropium (ATROVENT) nebulizer solution 0.5 mg (0.5 mg Nebulization Given 08/04/22 2151)  methylPREDNISolone sodium succinate (SOLU-MEDROL) 125 mg/2 mL injection 125 mg (125 mg Intravenous Given 08/04/22 2157)  magnesium sulfate IVPB 2 g 50 mL (0 g Intravenous Stopped 08/04/22 2305)    Mobility walks     Focused Assessments Cardiac Assessment Handoff:    No results found for: "CKTOTAL", "CKMB", "CKMBINDEX", "TROPONINI" Lab Results  Component Value Date   DDIMER 0.67 (H) 04/07/2020   Does the Patient currently have chest pain? No    R Recommendations: See Admitting Provider Note  Report given to:   Additional Notes:

## 2022-08-05 NOTE — H&P (Signed)
Date: 08/05/2022               Patient Name:  Rachel Nolan MRN: [id number]  DOB: 11/25/1970 Age / Sex: 52 y.o., female   PCP: Morene Crocker, MD         Medical Service: Internal Medicine Teaching Service         Attending Physician: Dr. Reymundo Poll, MD      First Contact: Dr. Philomena Doheny, MD Pager [phone]    Second Contact: Dr. Morene Crocker, MD Pager [phone]         After Hours (After 5p/  First Contact Pager: [phone]  weekends / holidays): Second Contact Pager: [phone]   SUBJECTIVE   Chief Complaint: Worsening SOB  History of Present Illness: Rachel Nolan is a 52 y.o. with a PMH of COPD, Anxiety, Asthma, and Hypertension. She presented with SOB that had started yesterday triggered by warm temperatures (her usual trigger for her COPD). However, none of her home medications have been helpful. She turned the air on, sat by the air, used breathing treatments, tried CPAP. Is currently at 5L O2 at home. Usually uses duoneb TID but used it 4-5 times yesterday. She denies any sick contacts, but endorses a fever (102F) at 2pm yesterday which tylenol did help. Presently has a cough (started yesterday) and feels like there is sputum in her throat but does not want to come out. Has some nausea triggered by coughing, no vomiting, no swelling, no palpitations, no cp.  She has been unable to eat all day yesterday due to her being ill. Reports smoking tobacco and cocaine Tuesday night (the day prior to her presenting with symptoms). Of note, she was prescribed Anuro-ellipta upon her discharge in May, but she is currently on Symbicort. She has tried applying for medicaid multiple times and has not qualified.   ED Course: At the ED differential diagnosis was COPD exacerbation vs pneumonia, PE, vs acute cardiac pathology.    Venous blood gas was remarkable for PH 7.44. pCO2 39.9  WBC WNL, no electrolyte abnormalities.   CXR showed no acute changes, CXR was  not concerning for any acute cardiopulmonary disease.   She was given the following meds:  albuterol (PROVENTIL) (2.5 MG/3ML) 0.083% nebulizer solution (15 mg/hr Nebulization Given 08/04/22 2150)  ipratropium (ATROVENT) nebulizer solution 0.5 mg (0.5 mg Nebulization Given 08/04/22 2151)  methylPREDNISolone sodium succinate (SOLU-MEDROL) 125 mg/2 mL injection 125 mg (125 mg Intravenous Given 08/04/22 2157)  magnesium sulfate IVPB 2 g 50 mL (2 g Intravenous New Bag/Given 08/04/22 2205)    Past Medical History  Past Medical History:  Diagnosis Date   Anxiety    Asthma    COPD (chronic obstructive pulmonary disease) (HCC)    Depression    Hypertension    Scoliosis    Past Surgical History:  Procedure Laterality Date   BACK SURGERY     ECTOPIC PREGNANCY SURGERY      Allergies: Allergies as of 08/04/2022   (No Known Allergies)   Meds:   Amlodipine 10 mg daily Benzonatate 100 mg BID Budesonide-formoterol 80-4.5 mcg 2 puffs BID Hydrocortisone cream 1% applied daily PRN itch Seroquel 100 mg daily at bedtime Duoneb 3 mL nebulizer q6h PRN Gabapentin 400 mg BID Montelukast 10 mg daily  Family History  Problem Relation Age of Onset   Hypertension Mother    Hypertension Father    Social History: Lives with her sister in reportedly very moldy home, unemployed.  Has been smoking since age 48,  last use Tuesday night, most recently smoked 3-5 cigarettes a day this week (has been trying to cut down). Last heroin use 7 years ago Last crack cocaine use Tuesday night Last marijuana use tuesday night  Lives in an apartment with her sister Drinks beer occasionally, no liquor Pcp is Dr Daiva Eves Independent in ADLs but struggles with IADLs and sometimes gets help with baths from her sister  Review of Systems: A complete ROS was negative except as per HPI.   OBJECTIVE:   Physical Exam: Blood pressure (!) 161/103, pulse 62, resp. rate 17, height 5\' 6"  (1.676 m), weight 86.2 kg,  last menstrual period 04/19/2018, SpO2 100%.  Constitutional: Resting in bed, in acute distress HENT: normocephalic atraumatic, mucous membranes dry Eyes: conjunctiva non-erythematous, mildly injected. Neck: supple Cardiovascular: tachycardic but no mrg Pulmonary/Chest: Generalized wheezing. No rales or ronchi. Accessory muscle use. Rib retractions noted. Abdominal: soft, non-tender, non-distended MSK: normal bulk and tone Neurological: alert & oriented x 3 Skin: warm and dry, acyanotic. Psych: Anxious  Labs: CBC    Component Value Date/Time   WBC 4.0 08/04/2022 2156   RBC 4.22 08/04/2022 2156   HGB 12.8 08/04/2022 2156   HCT 39.3 08/04/2022 2156   PLT 201 08/04/2022 2156   MCV 93.1 08/04/2022 2156   MCH 30.3 08/04/2022 2156   MCHC 32.6 08/04/2022 2156   RDW 14.8 08/04/2022 2156   LYMPHSABS 1.7 08/04/2022 2156   MONOABS 0.5 08/04/2022 2156   EOSABS 0.2 08/04/2022 2156   BASOSABS 0.0 08/04/2022 2156    CMP     Component Value Date/Time   NA 140 08/04/2022 2200   K 3.9 08/04/2022 2200   CL 105 08/04/2022 2200   CO2 26 08/04/2022 2200   GLUCOSE 114 (H) 08/04/2022 2200   BUN 14 08/04/2022 2200   CREATININE 0.70 08/04/2022 2200   CALCIUM 9.3 08/04/2022 2200   PROT 6.7 06/29/2022 1832   ALBUMIN 3.6 06/29/2022 1832   AST 14 (L) 06/29/2022 1832   ALT 8 06/29/2022 1832   ALKPHOS 54 06/29/2022 1832   BILITOT 0.3 06/29/2022 1832   GFRNONAA >60 08/04/2022 2200   GFRAA >60 08/28/2019 2026    Imaging:   DG Chest Port 1 View  Result Date: 08/04/2022 CLINICAL DATA:  Dyspnea EXAM: PORTABLE CHEST 1 VIEW COMPARISON:  None Available. FINDINGS: Lungs are clear. No pneumothorax or pleural effusion. Cardiac size within normal limits. Pulmonary vascularity is normal. Stable thoracic sigmoid scoliosis with partially visualized right paraspinal Harrington rod again identified. No acute bone abnormality. IMPRESSION: 1. No active disease. Electronically Signed   By: Helyn Numbers M.D.    On: 08/04/2022 22:06     EKG: no changes from prior EKG.  ASSESSMENT & PLAN:   Assessment & Plan by Problem: Principal Problem:   COPD exacerbation (HCC)   #COPD Exacerbation  Rachel Nolan is a 52 y.o. person living with a history of COPD and several risk factors. Presents after known trigger with dyspnea, tachypnea, and reported fever of 102. On exam, she has wheezing throughout. No rales, no edema, no chest pain or palpitations. EKG did not show signs of acute cardiac pathology. Her Wells criteria score is zero. She improved in the ED after being given albuterol, ipratropium, and solumedrol. She most likely has a COPD exacerbation.   She has had more than one exacerbation leading to a hospitalization and would need a LABA, LAMA and ICS. Of note, she was prescribed Anuro-ellipta upon her discharge in May, but she is currently  on Symbicort. We would need to clarify with the patient what medication should would be able to consistently afford. Would consider switching to Anuro-ellipta if possible. ICS were not prescribed at her discharge because of barriers with insurance, but she should be getting these given her disease severity. She has tried applying for medicaid multiple times and has not qualified. She is on 5L O2 at home, and usually uses duoneb TID although she required it 4-5 times yesterday.  -Start Azithromycin 500mg  for 3 days given reported fever -Prednisone 40mg  daily for 4 doses (Solumedrol was given at ED)  -Duoneb 0.5mg -2.5mg  3mL -mometasone-formoterol 100-5 mcg 2puffs BID -Bipap   #Asthma  Cw montelukast 10mg  Daily at bedtime   #Scoliosis #Hx of Back Surgery  Cw home dose of gabapentin 400mg  BID   #Hypertension  Cw with home dose of amlodipine 10mg  daily  #Anxiety/Depression  Cw home dose of seroquel 100mg  daily at bedtime   Diet: Normal VTE: Enoxaparin IVF: None,None Code: DNR  Prior to Admission Living Arrangement: Home Anticipated Discharge Location:  Home Barriers to Discharge: Medical workup ongoing  Dispo: Admit patient to Observation with expected length of stay less than 2 midnights.  Signed: [facility]  Internal Medicine Resident, PGY-1 [name]  Pager: [phone]  08/05/2022, 2:29 AM

## 2022-08-05 NOTE — Evaluation (Signed)
Occupational Therapy Evaluation Patient Details Name: Rachel Nolan MRN: [id number] DOB: [birth year] Today's Date: 08/05/2022   History of Present Illness Rachel Nolan is a 52 y.o. female who presents with SOB. Workup for COPD exacerbation. PMH: polysubstance abuse (cocaine, marijuana, tobacco), COPD, chronic respiratory failure on 2L home O2, hypertension, anxiety, depression.   Clinical Impression   Pt evaluated s/p above admission list. Pt reports modified independence with functional mobility using rollator and receives assist from sister for LB ADLs and showering at baseline. Pt presents this session with c/o SOB with VSS on 3L [place] and decreased activity tolerance. Pt currently requires setup A for seated UB ADLs and supervision A for LB ADLs, pt reports she is at her functional baseline. Pt completed STS transfer from EOB and short distance room level mobility using RW with mod I, generalized supervision A given throughout for safety during eval. Pt does not require further OT services at this time as she is at her functional baseline.     Recommendations for follow up therapy are one component of a multi-disciplinary discharge planning process, led by the attending physician.  Recommendations may be updated based on patient status, additional functional criteria and insurance authorization.   Assistance Recommended at Discharge Intermittent Supervision/Assistance  Patient can return home with the following A little help with bathing/dressing/bathroom;Assist for transportation;Help with stairs or ramp for entrance    Functional Status Assessment  Patient has had a recent decline in their functional status and demonstrates the ability to make significant improvements in function in a reasonable and predictable amount of time.  Equipment Recommendations  Tub/shower seat    Recommendations for Other Services       Precautions / Restrictions Precautions Precautions:  Fall Restrictions Weight Bearing Restrictions: No      Mobility Bed Mobility               General bed mobility comments: Not assessed, pt sitting EOB upon arrival    Transfers Overall transfer level: Modified independent Equipment used: Rolling walker (2 wheels)               General transfer comment: STS transfer and short distance room level mobility using RW with generalized supervision A for safety during eval      Balance Overall balance assessment: Mild deficits observed, not formally tested                                         ADL either performed or assessed with clinical judgement   ADL Overall ADL's : At baseline                                       General ADL Comments: Pt reports being at functional baseline. Pt provided with generalized supervision A for safety during eval     Vision Baseline Vision/History: 3 Glaucoma;4 Cataracts (right eye) Ability to See in Adequate Light: 0 Adequate Vision Assessment?: No apparent visual deficits     Perception Perception Perception Tested?: No   Praxis Praxis Praxis tested?: Not tested    Pertinent Vitals/Pain Pain Assessment Pain Assessment: Faces Faces Pain Scale: No hurt Pain Intervention(s): Monitored during session     Hand Dominance Right   Extremity/Trunk Assessment Upper Extremity Assessment Upper Extremity Assessment: Overall WFL for tasks assessed   Lower  Extremity Assessment Lower Extremity Assessment: Defer to PT evaluation   Cervical / Trunk Assessment Cervical / Trunk Assessment: Normal   Communication Communication Communication: No difficulties   Cognition Arousal/Alertness: Awake/alert Behavior During Therapy: WFL for tasks assessed/performed Overall Cognitive Status: Within Functional Limits for tasks assessed                                 General Comments: A+O x4, follows commands appropriately     General  Comments  SpO2 >92% on 3L [place] throughout    Exercises     Shoulder Instructions      Home Living Family/patient expects to be discharged to:: Private residence Living Arrangements: Other relatives (sister) Available Help at Discharge: Family;Available PRN/intermittently Type of Home: Apartment Home Access: Level entry     Home Layout: One level     Bathroom Shower/Tub: Chief Strategy Officer: Standard     Home Equipment: Rollator (4 wheels);Transport chair;Other (comment) (O2)   Additional Comments: Reports sister's apartment has mold and does not want to stay there long      Prior Functioning/Environment Prior Level of Function : Needs assist             Mobility Comments: mod indep with rollator ADLs Comments: Sister occasionally helps with LB ADLs, assists with bathing, friends provide transportation.        OT Problem List: Decreased activity tolerance;Impaired balance (sitting and/or standing);Cardiopulmonary status limiting activity      OT Treatment/Interventions:      OT Goals(Current goals can be found in the care plan section) Acute Rehab OT Goals Patient Stated Goal: to feel better OT Goal Formulation: With patient Time For Goal Achievement: 08/05/22 Potential to Achieve Goals: Good  OT Frequency:      Co-evaluation              AM-PAC OT "6 Clicks" Daily Activity     Outcome Measure Help from another person eating meals?: None Help from another person taking care of personal grooming?: A Little Help from another person toileting, which includes using toliet, bedpan, or urinal?: A Little Help from another person bathing (including washing, rinsing, drying)?: A Little Help from another person to put on and taking off regular upper body clothing?: A Little Help from another person to put on and taking off regular lower body clothing?: A Little 6 Click Score: 19   End of Session Equipment Utilized During Treatment: Rolling  walker (2 wheels);Oxygen Nurse Communication: Mobility status  Activity Tolerance: Patient tolerated treatment well Patient left: in bed;with call bell/phone within reach  OT Visit Diagnosis: Unsteadiness on feet (R26.81)                Time: 2952-8413 OT Time Calculation (min): 21 min Charges:  OT General Charges $OT Visit: 1 Visit OT Evaluation $OT Eval Low Complexity: 1 Low  Sherley Bounds, OTS Acute Rehabilitation Services Office [phone] Secure Chat Communication Preferred   Sherley Bounds 08/05/2022, 11:10 AM

## 2022-08-05 NOTE — TOC Benefit Eligibility Note (Signed)
Pharmacy Patient Advocate Encounter  Insurance verification completed.    The patient is insured through [employer]  Ran test claim for [employer]  and the current 30 day co-pay is $427.07 due to a $8500 deductible.  Ran test claim for [employer]  and the current 30 day co-pay is $308.14 due to a $8500 deductible.  Ran test claim for [employer] and the current 30 day co-pay is $460.52 due to a $8500 deductible.  This test claim was processed through [facility]- copay amounts may vary at other pharmacies due to pharmacy/plan contracts, or as the patient moves through the different stages of their insurance plan.    Rachel Nolan, CPHT Pharmacy Patient Advocate Specialist [facility] Patient Advocate Team Direct Number: [phone]  Fax: [phone]

## 2022-08-06 DIAGNOSIS — I159 Secondary hypertension, unspecified: Secondary | ICD-10-CM

## 2022-08-06 DIAGNOSIS — F17210 Nicotine dependence, cigarettes, uncomplicated: Secondary | ICD-10-CM

## 2022-08-06 DIAGNOSIS — J441 Chronic obstructive pulmonary disease with (acute) exacerbation: Secondary | ICD-10-CM | POA: Diagnosis not present

## 2022-08-06 LAB — CBC
HCT: 35.6 % — ABNORMAL LOW (ref 36.0–46.0)
Hemoglobin: 11.6 g/dL — ABNORMAL LOW (ref 12.0–15.0)
MCH: 31 pg (ref 26.0–34.0)
MCHC: 32.6 g/dL (ref 30.0–36.0)
MCV: 95.2 fL (ref 80.0–100.0)
Platelets: 177 10*3/uL (ref 150–400)
RBC: 3.74 MIL/uL — ABNORMAL LOW (ref 3.87–5.11)
RDW: 15 % (ref 11.5–15.5)
WBC: 10.5 10*3/uL (ref 4.0–10.5)
nRBC: 0 % (ref 0.0–0.2)

## 2022-08-06 LAB — BASIC METABOLIC PANEL
Anion gap: 8 (ref 5–15)
BUN: 19 mg/dL (ref 6–20)
CO2: 25 mmol/L (ref 22–32)
Calcium: 8.8 mg/dL — ABNORMAL LOW (ref 8.9–10.3)
Chloride: 107 mmol/L (ref 98–111)
Creatinine, Ser: 0.56 mg/dL (ref 0.44–1.00)
GFR, Estimated: >60 mL/min (ref >60)
Glucose, Bld: 120 mg/dL — ABNORMAL HIGH (ref 70–99)
Potassium: 3.2 mmol/L — ABNORMAL LOW (ref 3.5–5.1)
Sodium: 140 mmol/L (ref 135–145)

## 2022-08-06 LAB — GLUCOSE, CAPILLARY
Glucose-Capillary: 119 mg/dL — ABNORMAL HIGH (ref 70–99)
Glucose-Capillary: 140 mg/dL — ABNORMAL HIGH (ref 70–99)
Glucose-Capillary: 149 mg/dL — ABNORMAL HIGH (ref 70–99)
Glucose-Capillary: 111 mg/dL — ABNORMAL HIGH (ref 70–99)

## 2022-08-06 MED ORDER — IPRATROPIUM-ALBUTEROL 0.5-2.5 (3) MG/3ML IN SOLN
3.0000 mL | RESPIRATORY_TRACT | Status: DC | PRN
  Administered 2022-08-07: 3 mL via RESPIRATORY_TRACT
  Filled 2022-08-06: qty 3

## 2022-08-06 MED ORDER — GUAIFENESIN-DM 100-10 MG/5ML PO SYRP
ORAL_SOLUTION | ORAL | Status: AC
  Filled 2022-08-06: qty 10

## 2022-08-06 MED ORDER — ALBUTEROL SULFATE HFA 108 (90 BASE) MCG/ACT IN AERS: 2.0000 | INHALATION_SPRAY | RESPIRATORY_TRACT | Status: DC | PRN

## 2022-08-06 MED ORDER — POTASSIUM CHLORIDE CRYS ER 20 MEQ PO TBCR
EXTENDED_RELEASE_TABLET | ORAL | Status: AC
  Filled 2022-08-06: qty 2

## 2022-08-06 MED ORDER — GUAIFENESIN-DM 100-10 MG/5ML PO SYRP: 15.0000 mL | ORAL_SOLUTION | ORAL | Status: DC | PRN

## 2022-08-06 MED ORDER — GUAIFENESIN-DM 100-10 MG/5ML PO SYRP
15.0000 mL | ORAL_SOLUTION | Freq: Once | ORAL | Status: AC
  Administered 2022-08-06: 15 mL via ORAL

## 2022-08-06 MED ORDER — POTASSIUM CHLORIDE CRYS ER 20 MEQ PO TBCR
40.0000 meq | EXTENDED_RELEASE_TABLET | ORAL | Status: AC
  Administered 2022-08-06 (×2): 40 meq via ORAL
  Filled 2022-08-06: qty 2

## 2022-08-06 NOTE — Progress Notes (Signed)
   08/06/22 2000  BiPAP/CPAP/SIPAP  BiPAP/CPAP/SIPAP Pt Type Adult  Reason BIPAP/CPAP not in use  (Wears @home  but refusing our unit)  BiPAP/CPAP /SiPAP Vitals  SpO2 99 %  Bilateral Breath Sounds Clear;Diminished

## 2022-08-06 NOTE — Progress Notes (Signed)
Mobility Specialist Progress Note:    08/06/22 1540  Mobility  Activity Ambulated with assistance in hallway  Level of Assistance Contact guard assist, steadying assist  Assistive Device Front wheel walker  Distance Ambulated (ft) 88 ft  Activity Response Tolerated well  Mobility Referral Yes  $Mobility charge 1 Mobility  Mobility Specialist Start Time (ACUTE ONLY) 1452  Mobility Specialist Stop Time (ACUTE ONLY) 1503  Mobility Specialist Time Calculation (min) (ACUTE ONLY) 11 min   Pt received in bed, agreeable to ambulate. Requested BR before ambulating in halls, void successful. Needed no physical assistance throughout session. Session limited d/t fatigue and SOB. Returned to bed w/ call bell and personal belongings in reach.   Pellegrini Grayer Mobility Specialist  Please contact vis Secure Chat or  Rehab Office [phone]

## 2022-08-06 NOTE — Progress Notes (Signed)
Summary: Cora Brierley is a 52 y.o. with a history of COPD, anxiety, asthma, and hypertension. She presented with dyspnea, tachypnea, and reported fever of 102. Triggers include heat/temperature. EKG did not show signs of acute cardiac pathology. Her Wells criteria score on admission was zero. She improved in the ED after being given albuterol, ipratropium, and solumedrol. Patient was admitted for a COPD exacerbation.   Subjective:  Patient lost IV access last night, not needed anymore so it was discontinued. No other overnight events. Patient still feeling wheezy and experienced central chest pain that started yesterday and is intermittent. Does not have CP currently. Has been coughing, reports moldy living environment that triggers her asthma. Denies N/V, dysuria, diarrhea, or constipation.   Objective:  Vital signs in last 24 hours: Vitals:   08/06/22 0700 08/06/22 0828 08/06/22 0829 08/06/22 0830  BP: 120/81     Pulse: 74     Resp: 15     Temp: 98.1 F (36.7 C)     TempSrc: Oral     SpO2: 95% 98% 97% 95%  Weight:      Height:          Latest Ref Rng & Units 08/06/2022    4:56 AM 08/05/2022    3:08 AM 08/04/2022    9:56 PM  CBC  WBC 4.0 - 10.5 K/uL 10.5  4.2  4.0   Hemoglobin 12.0 - 15.0 g/dL 28.4  13.2  44.0   Hematocrit 36.0 - 46.0 % 35.6  40.8  39.3   Platelets 150 - 400 K/uL 177  189  201        Latest Ref Rng & Units 08/06/2022    4:56 AM 08/05/2022    3:08 AM 08/04/2022   10:00 PM  CMP  Glucose 70 - 99 mg/dL 102  725  366   BUN 6 - 20 mg/dL 19  13  14    Creatinine 0.44 - 1.00 mg/dL 4.40  3.47  4.25   Sodium 135 - 145 mmol/L 140  135  140   Potassium 3.5 - 5.1 mmol/L 3.2  3.5  3.9   Chloride 98 - 111 mmol/L 107  102  105   CO2 22 - 32 mmol/L 25  24  26    Calcium 8.9 - 10.3 mg/dL 8.8  8.9  9.3      Intake/Output Summary (Last 24 hours) at 08/06/2022 1308 Last data filed at 08/06/2022 0900 Gross per 24 hour  Intake 240 ml  Output --  Net 240 ml     Physical  Exam Constitutional: Patient in bed with increased wheezing on 2L O2 via [place].  CV: Regular rate and rhythm without rubs, murmurs, or gallops. No LE edema.  Pulmonary/Respiratory: Diffuse wheezing, improved breath sounds bilaterally.  Abdominal: Soft, non-tender, non-distended; positive bowel sounds.  Skin: Warm and dry.  Psych: Normal mood and affect.   Assessment/Plan:  Principal Problem:   COPD exacerbation (HCC)  Mirabelle Cyphers is a 52 y.o. with a history of COPD, anxiety, asthma, and hypertension. Patient was admitted for a COPD exacerbation. Received solumedrol and BIPAP in ED.  Patient started on azithromycin 500 mg, prednisone 40 mg, duoneb, and mometasone-formoterol.   Multiple hospitalizations for similar COPD exacerbations. Per chart review, she was prescribed Anuro-ellipta upon her discharge in May, but she is currently on Symbicort. ICS were not prescribed at her discharge because of barriers with insurance. She has applied for medicaid multiple times and has not qualified. She is on 5L O2  at home. Added O2 parameters 88-92% O2 sat given COPD history.   Patient's wheezing worse on exam today. Patient on 2L O2 via [place]. Will add SABA and robitussin for cough.    - Azithromycin 500 mg (7/18-7/20, 3 doses) - Prednisone 40 mg (7/18-7/21, 4 doses) - Duoneb 0.5 mg-2.5mg  3mL - mometasone-formoterol 100-5 mcg 2 puffs BID - umeclindinium bromide (LAMA) 1 puff daily  - START albuterol PRN  - START robitussin PRN  - f/u pulse ox ambulatory assessment   #Asthma  -Continue montelukast 10mg  Daily at bedtime    #Scoliosis #Hx of Back Surgery  - Continue home dose of gabapentin 400mg  BID     #Hypertension  BP intermittently hypertensive and tachypneic on 2-5 L O2.  - Continue with home dose of amlodipine 10mg  daily - Monitor BP    #Anxiety/Depression  - Continue home dose of seroquel 100mg  daily at bedtime   #T2DM  A1C 5.5 7/18, 5.8 7 months ago.  - SSI    Diet: Normal VTE:  Enoxaparin IVF: None, None Code: DNR  Prior to Admission Living Arrangement: Living with sister Anticipated Discharge Location: Home with sister Barriers to Discharge: Medical management Dispo: Anticipated discharge in approximately 1 day(s).   Philomena Doheny, MD, PGY-1 08/06/2022, 1:08 PM Pager: @MYPAGER @ After 5pm on weekdays and 1pm on weekends: On Call pager [phone]

## 2022-08-06 NOTE — Progress Notes (Signed)
SATURATION QUALIFICATIONS: (This note is used to comply with regulatory documentation for home oxygen)  Patient Saturations on Room Air at Rest = 94%  Patient Saturations on Room Air while Ambulating = 91%  Patient Saturations on 2 Liters of oxygen while Ambulating = 96%  Please briefly explain why patient needs home oxygen:

## 2022-08-06 NOTE — Plan of Care (Signed)

## 2022-08-06 NOTE — Progress Notes (Signed)
IV access lost during wash up, IV team questions need for IV since no IV meds ordered. Writer contacted provider, Champ Mungo, DO- okay for pt to not have IV for now. Also, per provider, will start to wean O2 per VO: as able let's decrease her supplemental O2 to SpO2 goal of closer to 90-92%. if she's symptomatic with lower supplementation no problem we can keep it higher to address symptoms. Will decrease and monitor.

## 2022-08-07 ENCOUNTER — Other Ambulatory Visit (HOSPITAL_COMMUNITY): Payer: Self-pay

## 2022-08-07 ENCOUNTER — Other Ambulatory Visit: Payer: Self-pay | Admitting: Student

## 2022-08-07 DIAGNOSIS — J441 Chronic obstructive pulmonary disease with (acute) exacerbation: Principal | ICD-10-CM

## 2022-08-07 LAB — CBC
HCT: 38 % (ref 36.0–46.0)
Hemoglobin: 12.3 g/dL (ref 12.0–15.0)
MCH: 30.2 pg (ref 26.0–34.0)
MCHC: 32.4 g/dL (ref 30.0–36.0)
MCV: 93.4 fL (ref 80.0–100.0)
Platelets: 189 10*3/uL (ref 150–400)
RBC: 4.07 MIL/uL (ref 3.87–5.11)
RDW: 15.2 % (ref 11.5–15.5)
WBC: 10 10*3/uL (ref 4.0–10.5)
nRBC: 0 % (ref 0.0–0.2)

## 2022-08-07 LAB — COMPREHENSIVE METABOLIC PANEL
ALT: 14 U/L (ref 0–44)
AST: 17 U/L (ref 15–41)
Albumin: 3.2 g/dL — ABNORMAL LOW (ref 3.5–5.0)
Alkaline Phosphatase: 43 U/L (ref 38–126)
Anion gap: 7 (ref 5–15)
BUN: 18 mg/dL (ref 6–20)
CO2: 25 mmol/L (ref 22–32)
Calcium: 9 mg/dL (ref 8.9–10.3)
Chloride: 106 mmol/L (ref 98–111)
Creatinine, Ser: 0.87 mg/dL (ref 0.44–1.00)
GFR, Estimated: >60 mL/min (ref >60)
Glucose, Bld: 115 mg/dL — ABNORMAL HIGH (ref 70–99)
Potassium: 4.6 mmol/L (ref 3.5–5.1)
Sodium: 138 mmol/L (ref 135–145)
Total Bilirubin: 0.6 mg/dL (ref 0.3–1.2)
Total Protein: 6.1 g/dL — ABNORMAL LOW (ref 6.5–8.1)

## 2022-08-07 LAB — GLUCOSE, CAPILLARY
Glucose-Capillary: 116 mg/dL — ABNORMAL HIGH (ref 70–99)
Glucose-Capillary: 202 mg/dL — ABNORMAL HIGH (ref 70–99)

## 2022-08-07 MED ORDER — AMLODIPINE BESYLATE 10 MG PO TABS
10.0000 mg | ORAL_TABLET | Freq: Every day | ORAL | 0 refills | Status: DC
  Filled 2022-08-07: qty 30, 30d supply, fill #0

## 2022-08-07 MED ORDER — PREDNISONE 20 MG PO TABS
40.0000 mg | ORAL_TABLET | Freq: Every day | ORAL | 0 refills | Status: AC
  Filled 2022-08-07: qty 2, 1d supply, fill #0

## 2022-08-07 MED ORDER — GABAPENTIN 300 MG PO CAPS
300.0000 mg | ORAL_CAPSULE | Freq: Three times a day (TID) | ORAL | 0 refills | Status: DC
  Filled 2022-08-07: qty 90, 30d supply, fill #0

## 2022-08-07 MED ORDER — ALBUTEROL SULFATE HFA 108 (90 BASE) MCG/ACT IN AERS: 1.0000 | INHALATION_SPRAY | RESPIRATORY_TRACT | Status: DC

## 2022-08-07 MED ORDER — ALBUTEROL SULFATE (2.5 MG/3ML) 0.083% IN NEBU: 2.5000 mg | INHALATION_SOLUTION | RESPIRATORY_TRACT | Status: DC

## 2022-08-07 MED ORDER — LIDOCAINE 5 % EX PTCH
2.0000 | MEDICATED_PATCH | CUTANEOUS | Status: DC
  Filled 2022-08-07: qty 2

## 2022-08-07 MED ORDER — BUDESONIDE-FORMOTEROL FUMARATE 80-4.5 MCG/ACT IN AERO: 2.0000 | INHALATION_SPRAY | Freq: Two times a day (BID) | RESPIRATORY_TRACT | Status: DC

## 2022-08-07 MED ORDER — UMECLIDINIUM BROMIDE 62.5 MCG/ACT IN AEPB: 1.0000 | INHALATION_SPRAY | Freq: Every day | RESPIRATORY_TRACT | Status: AC

## 2022-08-07 MED ORDER — QUETIAPINE FUMARATE 100 MG PO TABS
100.0000 mg | ORAL_TABLET | Freq: Every day | ORAL | 0 refills | Status: AC
  Filled 2022-08-07: qty 30, 30d supply, fill #0

## 2022-08-07 MED ORDER — GABAPENTIN 400 MG PO CAPS: 400.0000 mg | ORAL_CAPSULE | Freq: Two times a day (BID) | ORAL | 0 refills | Status: DC | PRN

## 2022-08-07 MED ORDER — MOMETASONE FURO-FORMOTEROL FUM 100-5 MCG/ACT IN AERO: 2.0000 | INHALATION_SPRAY | Freq: Two times a day (BID) | RESPIRATORY_TRACT | Status: DC

## 2022-08-07 MED ORDER — QUETIAPINE FUMARATE 100 MG PO TABS: 100.0000 mg | ORAL_TABLET | Freq: Every day | ORAL | 0 refills | Status: DC

## 2022-08-07 NOTE — Progress Notes (Signed)
Discharged home, AVS reviewed, no IV, TOC meds. Patient left floor via wheelchair with staff.

## 2022-08-07 NOTE — Progress Notes (Signed)
TOC Pharmacy filled scripts and medications were delivered to the pt's room and handed to the pt at this time. Pt states she is still trying to get her ride to come pick her up, states "he" is still in the [employer] conference at church".   Encouraged pt to keep trying to reach him.  Will inform pt's primary nurse of the above.   Azyah Flett,RN SWOT

## 2022-08-07 NOTE — Discharge Summary (Signed)
Name: Rachel Nolan MRN: [id number] DOB: 1970-08-06 52 y.o. PCP: Rachel Crocker, MD  Date of Admission: 08/04/2022  9:21 PM Date of Discharge: 08/07/2022 5:15 PM Attending Physician: Dr. Criselda Nolan  Discharge Diagnosis: Principal Problem:   COPD exacerbation [facility]) Active Problems:   Secondary hypertension    Discharge Medications: Allergies as of 08/07/2022   No Known Allergies      Medication List     STOP taking these medications    benzonatate 100 MG capsule Commonly known as: TESSALON   ipratropium-albuterol 0.5-2.5 (3) MG/3ML Soln Commonly known as: DUONEB       TAKE these medications    albuterol (2.5 MG/3ML) 0.083% nebulizer solution Commonly known as: PROVENTIL Inhale 3 mLs (2.5 mg total) into the lungs every 4 (four) hours.   amLODipine 10 MG tablet Commonly known as: NORVASC Take 1 tablet (10 mg total) by mouth daily. What changed: medication strength   budesonide-formoterol 80-4.5 MCG/ACT inhaler Commonly known as: SYMBICORT Inhale 2 puffs into the lungs 2 (two) times daily. Start taking on: September 06, 2022 What changed: These instructions start on September 06, 2022. If you are unsure what to do until then, ask your doctor or other care provider.   gabapentin 300 MG capsule Commonly known as: Neurontin Take 1 capsule (300 mg total) by mouth 3 (three) times daily. What changed:  medication strength how much to take when to take this reasons to take this   mometasone-formoterol 100-5 MCG/ACT Aero Commonly known as: DULERA Inhale 2 puffs into the lungs 2 (two) times daily.   montelukast 10 MG tablet Commonly known as: SINGULAIR Take 1 tablet (10 mg total) by mouth at bedtime.   predniSONE 20 MG tablet Commonly known as: DELTASONE Take 2 tablets (40 mg total) by mouth daily with breakfast for 1 day. Start taking on: August 08, 2022   QUEtiapine 100 MG tablet Commonly known as: SEROquel Take 1 tablet (100 mg total) by mouth at  bedtime.   umeclidinium bromide 62.5 MCG/ACT Aepb Commonly known as: INCRUSE ELLIPTA Inhale 1 puff into the lungs daily for 1 dose.        Disposition and follow-up:   [name] was discharged from [facility] in Stable condition.  At the hospital follow up visit please address:  1.  Follow-up:  COPD exacerbation management  - Patient should complete prednisone 40 mg daily on 7/21. Started on DULERA and INCRUSE ELLIPTA. Got inhalers on discharge, should have them. Once patient finishes her DULERA inhaler (about a month supply), she can use what's left in her SYMBICORT inhaler. Patient with intermittent cough causing chest pain, improved with Robitussin DM which patient was told she can get OTC (with precautions due to increased drowsiness).   Asthma  - Started on ALBUTEROL PRN given increased wheezing. Given refill for home montelukast at time of discharge. Ensure patient has continued access to medications. Follow up on living situation with mold as a trigger for exacerbations (patient requested face masks).   High blood pressures - Patient intermittently hypertensive throughout hospitalization. No changes were made to her home dose of amlodipine. Follow up on blood pressures and make medication adjustments as needed.   Back pain/scoliosis/history of surgery  - Patient expressed interest in additional pain medication for back pain management. Lidocaine patch with minimal effect. Mentioned tramadol to nurse on day of discharge. On gabapentin 300 mg BID. Told she can take tylenol 1000 mg every 8 hours as needed and use topical medications like diclofenac  gel or Bengay in discharge instructions.    Disability paperwork - Patient is insured but cannot afford medications. Has applied for disability in the past and rejected but would like to try again. Stated she would bring in her paperwork at follow-up visit for assistance with application.    Home oxygen  -  Patient on 5 L at home. Normal ambulatory assessment with saturation at 92% and perceived shortness of breath and fatigue. Follow up on oxygen needs.    2.  Labs / imaging needed at time of follow-up: n/a  3.  Pending labs/ test needing follow-up: n/a  4.  Medication Changes  STOPPED  - Tessalon Perles   ADDED  - DULERA  - INCRUSE ELLIPTA  - ALBUTEROL   - Robitussin PM (over the counter)    MODIFIED  - SYMBICORT- will resume once patient finishes DULERA inhaler   Follow-up Appointments: None   Hospital Course by problem list: Summary:  Rachel Nolan is a 52 y.o. with a history of COPD, anxiety, asthma, and hypertension. Patient was admitted for a COPD exacerbation.   #COPD Exacerbation Cough with sputum Multiple hospitalizations for similar COPD exacerbations. Per chart review, patient was prescribed Anoro-ellipta upon discharge in May, but she is currently on Symbicort. Inhaled corticosteroids were not prescribed at the time due to insurance barriers. She has applied for medicaid multiple times and has not qualified.   Received solumedrol and BIPAP in ED. Patient started on azithromycin 500 mg for 3 doses (7/18-7/20), prednisone 40 mg for 4 doses (7/18-7/21), Duoneb 0.5 mg-2.5 mg 3mL, and mometasone-formoterol 100-5 mcg 2 puffs BID. Patient was started on umeclidinium (LAMA) 1 puff daily on 7/18. Also added robitussin PRN for her intermittent cough, which was causing some chest pain. Her pulse ox ambulatory assessment showed patient removed from 2L O2 for 5 minutes and held 94% O2 saturation at rest. She was able to walk from her room (18) to room 23 and back and reported fatigue but her O2 stayed at 91 and above. Her heart rate increased to 124 and her breathing became heavier although sats were WNL after returning to her room. She is on 5L O2 at home and does not report any difficulties obtaining it. Does report living in a house with mold, which can be a trigger for her  breathing. Added O2 parameters 88-92% O2 sat given COPD history. Patient discharging on ALBUTEROL, DULERA, and INCRUSE ELLIPTA. Provided with inpatient inhalers to take home.   #Asthma  Patient with diffuse wheezing both lungs on admission, wheezing improved throughout hospitalization. Continued montelukast 10 mg daily at bedtime, and added ALBUTEROL inhaler on 7/19.    #Scoliosis #Hx of Back Surgery  Patient continued on home dose of gabapentin 300 mg TID. Complained of additional pain but was only given lidocaine patch for lower back. Also asked about increasing dose of gabapentin to help with sleep but explained to patient that there are other options to help with sleep (already on seroquel) and that she can address those concerns at her outpatient follow-up appointment.    #Hypertension  Patient's blood pressures intermittently hypertensive and tachypneic on supplemental O2. Continued patient's home dose of amlodipine 10 mg daily. May need to consider logging blood pressures at home and adding another antihypertensive agent in the outpatient setting.    #Anxiety/Depression  Patient was continued on her home dose of seroquel 100mg  daily at bedtime.   Discharge Subjective: Doing better today, less wheezing. Patient reports coughing has improved, although still having  some sputum. Denies any CP, fever, chills, N/V, problems with urination, constipation, or diarrhea. Was satting 96% on 2L via [place]. Expressed concerns about returning to home environment where there is mold. Requested to have some face masks at discharge. Asked a question about disability paperwork. Informed patient about upcoming follow-up appointment at [facility] and that she can bring the paperwork with her. Patient feels ok to discharge and did not request any assistance arranging a ride home.   Discharge Exam:   Blood pressure 113/77, pulse 77, temperature 98 F (36.7 C), resp. rate 14, height 5\' 6"  (1.676 m), weight 86.2 kg, last  menstrual period 04/19/2018, SpO2 98%.  Constitutional: Patient resting in bed in no acute distress, minimal wheezing, 2L O2 via [place]. HENT: normocephalic atraumatic, mucous membranes moist. Cardiovascular: regular rate and rhythm, no m/r/g; no pitting edema. Pulmonary/Chest: normal work of breathing on room air, lungs clear to auscultation bilaterally; wheezing improved posterior lungs compared to previous exams.  Abdominal: soft, non-tender, non-distended.  Neurological: alert & oriented to self, place, and situation.  MSK: no gross abnormalities.  Skin: warm and dry. Psych: Normal mood and affect.  Pertinent Labs, Studies, and Procedures:     Latest Ref Rng & Units 08/07/2022    2:06 AM 08/06/2022    4:56 AM 08/05/2022    3:08 AM  CBC  WBC 4.0 - 10.5 K/uL 10.0  10.5  4.2   Hemoglobin 12.0 - 15.0 g/dL 53.6  64.4  03.4   Hematocrit 36.0 - 46.0 % 38.0  35.6  40.8   Platelets 150 - 400 K/uL 189  177  189        Latest Ref Rng & Units 08/07/2022    2:06 AM 08/06/2022    4:56 AM 08/05/2022    3:08 AM  CMP  Glucose 70 - 99 mg/dL 742  595  638   BUN 6 - 20 mg/dL 18  19  13    Creatinine 0.44 - 1.00 mg/dL 7.56  4.33  2.95   Sodium 135 - 145 mmol/L 138  140  135   Potassium 3.5 - 5.1 mmol/L 4.6  3.2  3.5   Chloride 98 - 111 mmol/L 106  107  102   CO2 22 - 32 mmol/L 25  25  24    Calcium 8.9 - 10.3 mg/dL 9.0  8.8  8.9   Total Protein 6.5 - 8.1 g/dL 6.1     Total Bilirubin 0.3 - 1.2 mg/dL 0.6     Alkaline Phos 38 - 126 U/L 43     AST 15 - 41 U/L 17     ALT 0 - 44 U/L 14       DG Chest Port 1 View  Result Date: 08/04/2022 CLINICAL DATA:  Dyspnea EXAM: PORTABLE CHEST 1 VIEW COMPARISON:  None Available. FINDINGS: Lungs are clear. No pneumothorax or pleural effusion. Cardiac size within normal limits. Pulmonary vascularity is normal. Stable thoracic sigmoid scoliosis with partially visualized right paraspinal Harrington rod again identified. No acute bone abnormality. IMPRESSION: 1. No active  disease. Electronically Signed   By: Helyn Numbers M.D.   On: 08/04/2022 22:06     Discharge Instructions: Discharge Instructions     Call MD for:  difficulty breathing, headache or visual disturbances   Complete by: As directed    Call MD for:  extreme fatigue   Complete by: As directed    Call MD for:  hives   Complete by: As directed    Call MD  for:  persistant dizziness or light-headedness   Complete by: As directed    Call MD for:  persistant nausea and vomiting   Complete by: As directed    Call MD for:  redness, tenderness, or signs of infection (pain, swelling, redness, odor or green/yellow discharge around incision site)   Complete by: As directed    Call MD for:  severe uncontrolled pain   Complete by: As directed    Call MD for:  temperature >100.4   Complete by: As directed    Diet general   Complete by: As directed    Discharge instructions   Complete by: As directed    Patient Instructions:   - You were seen for shortness of breath and found to have a Chronic Obstructive Pulmonary Disease (COPD) exacerbation. Your breathing has improved with the addition of an antibiotic, and oral and inhaled medications.   - Please continue to use the medications we have prescribed during your stay, including your oral steroid until it's finished, and your new inhalers ALBUTEROL, DULERA, and INCRUSE ELLIPTA. You can finish out the [facility] and go back to the [facility] inhaler you already have.    - Please continue to take your regular home medications as prescribed.   - You can take Robitussin DM (over the counter) for cough. This medication can make you tired, please follow medication directions and do not drive or operate heavy machinery when taking it.   - Please go to your follow-up appointment at the Internal Medicine Clinic at [facility] (in the basement) on 08/20/22 @ 9:45 AM with Dr. Daiva Eves.   - Please call the Internal Medicine Clinic at [facility] during office  hours (8:00 AM - 5 PM) at [phone] if you have any questions regarding your care. Please give [place] a call if your shortness of breath worsens despite inhaler use, you have a new fever, cough or chills. Please call 911 if you are having a medical emergency.   - For your back pain, please take tylenol 1000 mg every 8 hours as needed and use topical medications like diclofenac gel or Bengay. Please follow up with your primary care doctor for your chronic pain needs.  It was a pleasure serving you during your stay with [place]! - Dr. Justin Mend and team   Increase activity slowly   Complete by: As directed        Signed: Tod Abrahamsen Colbert Coyer, MD [name] - PGY1 Pager: [phone] 08/07/2022, 5:15 PM    Please contact the on call pager after 5 pm and on weekends at [phone].

## 2022-08-09 ENCOUNTER — Telehealth: Payer: Self-pay

## 2022-08-09 NOTE — Transitions of Care (Post Inpatient/ED Visit) (Unsigned)
   08/09/2022  Name: Rachel Nolan MRN: [id number] DOB: 04/05/1970  Today's TOC FU Call Status: Today's TOC FU Call Status:: Unsuccessul Call (1st Attempt) Unsuccessful Call (1st Attempt) Date: 08/09/22  Attempted to reach the patient regarding the most recent Inpatient/ED visit.  Follow Up Plan: Additional outreach attempts will be made to reach the patient to complete the Transitions of Care (Post Inpatient/ED visit) call.   Signature Karena Addison, LPN [facility] Nurse Health Advisor Direct Dial [phone]

## 2022-08-10 ENCOUNTER — Other Ambulatory Visit (HOSPITAL_COMMUNITY): Payer: Self-pay

## 2022-08-10 NOTE — Transitions of Care (Post Inpatient/ED Visit) (Unsigned)
   08/10/2022  Name: Rachel Nolan MRN: [id number] DOB: 1970-04-17  Today's TOC FU Call Status: Today's TOC FU Call Status:: Unsuccessful Call (2nd Attempt) Unsuccessful Call (1st Attempt) Date: 08/09/22 Unsuccessful Call (2nd Attempt) Date: 08/10/22  Attempted to reach the patient regarding the most recent Inpatient/ED visit.  Follow Up Plan: Additional outreach attempts will be made to reach the patient to complete the Transitions of Care (Post Inpatient/ED visit) call.   Signature Karena Addison, LPN [facility] Nurse Health Advisor Direct Dial [phone]

## 2022-08-11 NOTE — Transitions of Care (Post Inpatient/ED Visit) (Signed)
   08/11/2022  Name: Rachel Nolan MRN: [id number] DOB: 15-Aug-1970  Today's TOC FU Call Status: Today's TOC FU Call Status:: Unsuccessful Call (3rd Attempt) Unsuccessful Call (1st Attempt) Date: 08/09/22 Unsuccessful Call (2nd Attempt) Date: 08/10/22 Unsuccessful Call (3rd Attempt) Date: 08/11/22  Attempted to reach the patient regarding the most recent Inpatient/ED visit.  Follow Up Plan: No further outreach attempts will be made at this time. We have been unable to contact the patient.  Signature Karena Addison, LPN [facility] Nurse Health Advisor Direct Dial [phone]

## 2022-08-20 ENCOUNTER — Other Ambulatory Visit (HOSPITAL_COMMUNITY): Payer: Self-pay

## 2022-08-20 ENCOUNTER — Encounter: Payer: MEDICAID | Admitting: Student

## 2022-08-23 ENCOUNTER — Encounter (HOSPITAL_COMMUNITY): Payer: Self-pay

## 2022-08-23 ENCOUNTER — Ambulatory Visit (INDEPENDENT_AMBULATORY_CARE_PROVIDER_SITE_OTHER)
Admission: EM | Admit: 2022-08-23 | Discharge: 2022-08-25 | Disposition: A | Discharge status: Other Acute Inpatient Hospital | Payer: No Typology Code available for payment source | Source: Home / Self Care

## 2022-08-23 ENCOUNTER — Emergency Department (HOSPITAL_COMMUNITY)
Admission: EM | Admit: 2022-08-23 | Discharge: 2022-08-23 | Disposition: A | Discharge status: Home / Self Care | Payer: No Typology Code available for payment source | Attending: Emergency Medicine | Admitting: Emergency Medicine

## 2022-08-23 ENCOUNTER — Emergency Department (HOSPITAL_COMMUNITY): Payer: No Typology Code available for payment source

## 2022-08-23 ENCOUNTER — Other Ambulatory Visit: Payer: Self-pay

## 2022-08-23 DIAGNOSIS — I10 Essential (primary) hypertension: Secondary | ICD-10-CM | POA: Insufficient documentation

## 2022-08-23 DIAGNOSIS — Z5900 Homelessness unspecified: Secondary | ICD-10-CM

## 2022-08-23 DIAGNOSIS — F4310 Post-traumatic stress disorder, unspecified: Secondary | ICD-10-CM | POA: Diagnosis not present

## 2022-08-23 DIAGNOSIS — J441 Chronic obstructive pulmonary disease with (acute) exacerbation: Secondary | ICD-10-CM | POA: Diagnosis not present

## 2022-08-23 DIAGNOSIS — R45851 Suicidal ideations: Secondary | ICD-10-CM

## 2022-08-23 DIAGNOSIS — F1210 Cannabis abuse, uncomplicated: Secondary | ICD-10-CM | POA: Insufficient documentation

## 2022-08-23 DIAGNOSIS — Z79899 Other long term (current) drug therapy: Secondary | ICD-10-CM | POA: Diagnosis not present

## 2022-08-23 DIAGNOSIS — Z1152 Encounter for screening for COVID-19: Secondary | ICD-10-CM | POA: Diagnosis not present

## 2022-08-23 DIAGNOSIS — F1523 Other stimulant dependence with withdrawal: Secondary | ICD-10-CM | POA: Insufficient documentation

## 2022-08-23 DIAGNOSIS — F1410 Cocaine abuse, uncomplicated: Secondary | ICD-10-CM | POA: Insufficient documentation

## 2022-08-23 DIAGNOSIS — F32A Depression, unspecified: Secondary | ICD-10-CM | POA: Insufficient documentation

## 2022-08-23 DIAGNOSIS — F419 Anxiety disorder, unspecified: Secondary | ICD-10-CM | POA: Insufficient documentation

## 2022-08-23 DIAGNOSIS — F1994 Other psychoactive substance use, unspecified with psychoactive substance-induced mood disorder: Secondary | ICD-10-CM | POA: Diagnosis not present

## 2022-08-23 DIAGNOSIS — Z765 Malingerer [conscious simulation]: Secondary | ICD-10-CM | POA: Insufficient documentation

## 2022-08-23 DIAGNOSIS — F17210 Nicotine dependence, cigarettes, uncomplicated: Secondary | ICD-10-CM | POA: Diagnosis not present

## 2022-08-23 DIAGNOSIS — F1910 Other psychoactive substance abuse, uncomplicated: Secondary | ICD-10-CM | POA: Diagnosis present

## 2022-08-23 DIAGNOSIS — R0602 Shortness of breath: Secondary | ICD-10-CM | POA: Diagnosis present

## 2022-08-23 LAB — CBC
HCT: 40.5 % (ref 36.0–46.0)
Hemoglobin: 13.3 g/dL (ref 12.0–15.0)
MCH: 30.4 pg (ref 26.0–34.0)
MCHC: 32.8 g/dL (ref 30.0–36.0)
MCV: 92.5 fL (ref 80.0–100.0)
Platelets: 236 10*3/uL (ref 150–400)
RBC: 4.38 MIL/uL (ref 3.87–5.11)
RDW: 14.7 % (ref 11.5–15.5)
WBC: 4.5 10*3/uL (ref 4.0–10.5)
nRBC: 0 % (ref 0.0–0.2)

## 2022-08-23 LAB — RESP PANEL BY RT-PCR (RSV, FLU A&B, COVID)  RVPGX2
Influenza A by PCR: NEGATIVE
Influenza B by PCR: NEGATIVE
Resp Syncytial Virus by PCR: NEGATIVE
SARS Coronavirus 2 by RT PCR: NEGATIVE

## 2022-08-23 LAB — I-STAT VENOUS BLOOD GAS, ED
Acid-Base Excess: 4 mmol/L — ABNORMAL HIGH (ref 0.0–2.0)
Bicarbonate: 28.3 mmol/L — ABNORMAL HIGH (ref 20.0–28.0)
Calcium, Ion: 1.14 mmol/L — ABNORMAL LOW (ref 1.15–1.40)
HCT: 38 % (ref 36.0–46.0)
Hemoglobin: 12.9 g/dL (ref 12.0–15.0)
O2 Saturation: 94 %
Potassium: 3.8 mmol/L (ref 3.5–5.1)
Sodium: 139 mmol/L (ref 135–145)
TCO2: 30 mmol/L (ref 22–32)
pCO2, Ven: 41.1 mmHg — ABNORMAL LOW (ref 44–60)
pH, Ven: 7.445 — ABNORMAL HIGH (ref 7.25–7.43)
pO2, Ven: 68 mmHg — ABNORMAL HIGH (ref 32–45)

## 2022-08-23 LAB — TROPONIN I (HIGH SENSITIVITY)
Troponin I (High Sensitivity): 5 ng/L (ref <18)
Troponin I (High Sensitivity): 4 ng/L (ref <18)

## 2022-08-23 LAB — COMPREHENSIVE METABOLIC PANEL
ALT: 9 U/L (ref 0–44)
AST: 15 U/L (ref 15–41)
Albumin: 3.5 g/dL (ref 3.5–5.0)
Alkaline Phosphatase: 68 U/L (ref 38–126)
Anion gap: 9 (ref 5–15)
BUN: 10 mg/dL (ref 6–20)
CO2: 24 mmol/L (ref 22–32)
Calcium: 9 mg/dL (ref 8.9–10.3)
Chloride: 104 mmol/L (ref 98–111)
Creatinine, Ser: 0.66 mg/dL (ref 0.44–1.00)
GFR, Estimated: >60 mL/min (ref >60)
Glucose, Bld: 98 mg/dL (ref 70–99)
Potassium: 3.8 mmol/L (ref 3.5–5.1)
Sodium: 137 mmol/L (ref 135–145)
Total Bilirubin: 0.6 mg/dL (ref 0.3–1.2)
Total Protein: 6.6 g/dL (ref 6.5–8.1)

## 2022-08-23 LAB — BRAIN NATRIURETIC PEPTIDE: B Natriuretic Peptide: 11.8 pg/mL (ref 0.0–100.0)

## 2022-08-23 MED ORDER — THIAMINE MONONITRATE 100 MG PO TABS
100.0000 mg | ORAL_TABLET | Freq: Every day | ORAL | Status: DC
  Administered 2022-08-24 – 2022-08-25 (×2): 100 mg via ORAL
  Filled 2022-08-23 (×2): qty 1

## 2022-08-23 MED ORDER — QUETIAPINE FUMARATE 100 MG PO TABS
100.0000 mg | ORAL_TABLET | Freq: Every day | ORAL | Status: DC
  Administered 2022-08-24 (×2): 100 mg via ORAL
  Filled 2022-08-23 (×2): qty 1

## 2022-08-23 MED ORDER — ALUM & MAG HYDROXIDE-SIMETH 200-200-20 MG/5ML PO SUSP: 30.0000 mL | ORAL | Status: DC | PRN

## 2022-08-23 MED ORDER — OLANZAPINE 10 MG PO TBDP: 10.0000 mg | ORAL_TABLET | Freq: Three times a day (TID) | ORAL | Status: DC | PRN

## 2022-08-23 MED ORDER — PREDNISONE 20 MG PO TABS
40.0000 mg | ORAL_TABLET | Freq: Every day | ORAL | Status: DC
  Administered 2022-08-24 – 2022-08-25 (×2): 40 mg via ORAL
  Filled 2022-08-23 (×2): qty 2

## 2022-08-23 MED ORDER — IPRATROPIUM-ALBUTEROL 0.5-2.5 (3) MG/3ML IN SOLN
3.0000 mL | Freq: Once | RESPIRATORY_TRACT | Status: AC
  Administered 2022-08-23: 3 mL via RESPIRATORY_TRACT
  Filled 2022-08-23: qty 3

## 2022-08-23 MED ORDER — HYDROXYZINE HCL 25 MG PO TABS: 25.0000 mg | ORAL_TABLET | Freq: Four times a day (QID) | ORAL | Status: DC | PRN

## 2022-08-23 MED ORDER — MOMETASONE FURO-FORMOTEROL FUM 100-5 MCG/ACT IN AERO
2.0000 | INHALATION_SPRAY | Freq: Two times a day (BID) | RESPIRATORY_TRACT | Status: DC
  Administered 2022-08-24 – 2022-08-25 (×4): 2 via RESPIRATORY_TRACT
  Filled 2022-08-23 (×2): qty 8.8

## 2022-08-23 MED ORDER — LORAZEPAM 1 MG PO TABS: 1.0000 mg | ORAL_TABLET | Freq: Two times a day (BID) | ORAL | Status: DC

## 2022-08-23 MED ORDER — AMLODIPINE BESYLATE 10 MG PO TABS
10.0000 mg | ORAL_TABLET | Freq: Every day | ORAL | Status: DC
  Administered 2022-08-24 – 2022-08-25 (×2): 10 mg via ORAL
  Filled 2022-08-23 (×2): qty 1

## 2022-08-23 MED ORDER — METHYLPREDNISOLONE SODIUM SUCC 125 MG IJ SOLR
125.0000 mg | Freq: Once | INTRAMUSCULAR | Status: AC
  Administered 2022-08-23: 125 mg via INTRAVENOUS
  Filled 2022-08-23: qty 2

## 2022-08-23 MED ORDER — MOMETASONE FURO-FORMOTEROL FUM 100-5 MCG/ACT IN AERO: 2.0000 | INHALATION_SPRAY | Freq: Two times a day (BID) | RESPIRATORY_TRACT | Status: DC

## 2022-08-23 MED ORDER — LORAZEPAM 1 MG PO TABS
1.0000 mg | ORAL_TABLET | Freq: Four times a day (QID) | ORAL | Status: DC
  Administered 2022-08-24 (×3): 1 mg via ORAL
  Filled 2022-08-23 (×4): qty 1

## 2022-08-23 MED ORDER — AZITHROMYCIN 250 MG PO TABS: 250.0000 mg | ORAL_TABLET | Freq: Every day | ORAL | 0 refills | Status: DC

## 2022-08-23 MED ORDER — LORAZEPAM 1 MG PO TABS: 1.0000 mg | ORAL_TABLET | Freq: Four times a day (QID) | ORAL | Status: DC | PRN

## 2022-08-23 MED ORDER — ACETAMINOPHEN 325 MG PO TABS: 650.0000 mg | ORAL_TABLET | Freq: Four times a day (QID) | ORAL | Status: DC | PRN

## 2022-08-23 MED ORDER — ADULT MULTIVITAMIN W/MINERALS CH
1.0000 | ORAL_TABLET | Freq: Every day | ORAL | Status: DC
  Administered 2022-08-24 – 2022-08-25 (×2): 1 via ORAL
  Filled 2022-08-23 (×2): qty 1

## 2022-08-23 MED ORDER — MAGNESIUM HYDROXIDE 400 MG/5ML PO SUSP: 30.0000 mL | Freq: Every day | ORAL | Status: DC | PRN

## 2022-08-23 MED ORDER — THIAMINE HCL 100 MG/ML IJ SOLN
100.0000 mg | Freq: Once | INTRAMUSCULAR | Status: AC
  Administered 2022-08-24: 100 mg via INTRAMUSCULAR
  Filled 2022-08-23: qty 2

## 2022-08-23 MED ORDER — GABAPENTIN 300 MG PO CAPS
300.0000 mg | ORAL_CAPSULE | Freq: Three times a day (TID) | ORAL | Status: DC
  Administered 2022-08-24 – 2022-08-25 (×5): 300 mg via ORAL
  Filled 2022-08-23 (×5): qty 1

## 2022-08-23 MED ORDER — LOPERAMIDE HCL 2 MG PO CAPS: 2.0000 mg | ORAL_CAPSULE | ORAL | Status: DC | PRN

## 2022-08-23 MED ORDER — LORAZEPAM 1 MG PO TABS: 1.0000 mg | ORAL_TABLET | Freq: Every day | ORAL | Status: DC

## 2022-08-23 MED ORDER — IOHEXOL 350 MG/ML SOLN
75.0000 mL | Freq: Once | INTRAVENOUS | Status: AC | PRN
  Administered 2022-08-23: 75 mL via INTRAVENOUS

## 2022-08-23 MED ORDER — LORAZEPAM 1 MG PO TABS: 1.0000 mg | ORAL_TABLET | ORAL | Status: DC | PRN

## 2022-08-23 MED ORDER — LORAZEPAM 1 MG PO TABS: 1.0000 mg | ORAL_TABLET | Freq: Three times a day (TID) | ORAL | Status: DC

## 2022-08-23 MED ORDER — AZITHROMYCIN 250 MG PO TABS
250.0000 mg | ORAL_TABLET | Freq: Every day | ORAL | Status: DC
  Administered 2022-08-24 – 2022-08-25 (×2): 250 mg via ORAL
  Filled 2022-08-23 (×2): qty 1

## 2022-08-23 MED ORDER — ONDANSETRON 4 MG PO TBDP: 4.0000 mg | ORAL_TABLET | Freq: Four times a day (QID) | ORAL | Status: DC | PRN

## 2022-08-23 MED ORDER — MONTELUKAST SODIUM 10 MG PO TABS
10.0000 mg | ORAL_TABLET | Freq: Every day | ORAL | Status: DC
  Administered 2022-08-24 (×2): 10 mg via ORAL
  Filled 2022-08-23 (×2): qty 1

## 2022-08-23 MED ORDER — ALBUTEROL SULFATE (2.5 MG/3ML) 0.083% IN NEBU
2.5000 mg | INHALATION_SOLUTION | RESPIRATORY_TRACT | Status: DC
  Administered 2022-08-24 – 2022-08-25 (×2): 2.5 mg via RESPIRATORY_TRACT
  Filled 2022-08-23 (×3): qty 3

## 2022-08-23 MED ORDER — ZIPRASIDONE MESYLATE 20 MG IM SOLR: 20.0000 mg | INTRAMUSCULAR | Status: DC | PRN

## 2022-08-23 MED ORDER — PREDNISONE 10 MG PO TABS
40.0000 mg | ORAL_TABLET | Freq: Every day | ORAL | 0 refills | Status: DC
Start: 2022-08-23 — End: 2022-08-31
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
## 2022-08-23 NOTE — Progress Notes (Signed)
   08/23/22 2057  [facility] Triage Screening (Walk-ins at [facility] only)  How Did You Hear About [place]? Self  What Is the Reason for Your Visit/Call Today? Pt presents to [facility] voluntarily, unaccompanied at this time due to worsening depression and requesting substance abuse treatment. Pt reports using crack yesterday (unknown amount) and has been using daily for an extended period of time. Pt reports her depression triggers her to use drugs and alcohol. Pt reports her living arrangements are enabling her as she lives with someone who is also using. Pt was just seen at ED for shortness of breath and appears to be wheezing a bit. Pt has hx of COPD, SI, and substance induced mood disorder. Pt currently denies SI,HI,AVH.  How Long Has This Been Causing You Problems? 1-6 months  Have You Recently Had Any Thoughts About Hurting Yourself? No  Are You Planning to Commit Suicide/Harm Yourself At This time? No  Have you Recently Had Thoughts About Hurting Someone [name]? No  Are You Planning To Harm Someone At This Time? No  Are you currently experiencing any auditory, visual or other hallucinations? No  Have You Used Any Alcohol or Drugs in the Past 24 Hours? Yes  How long ago did you use Drugs or Alcohol? yesterday  What Did You Use and How Much? crack  Do you have any current medical co-morbidities that require immediate attention? Yes  Please describe current medical co-morbidities that require immediate attention: COPD  Clinician description of patient physical appearance/behavior: depressed,tearful  What Do You Feel Would Help You the Most Today? Treatment for Depression or other mood problem;Alcohol or Drug Use Treatment  If access to [facility] was not available, would you have sought care in the Emergency Department? Yes  Determination of Need Urgent (48 hours)  Options For Referral Other: Comment;Facility-Based Crisis;Chemical Dependency Intensive Outpatient Therapy (CDIOP)

## 2022-08-23 NOTE — ED Provider Notes (Signed)
52 yo female Hx poorly controlled COPD Here with dib/cough No hypoxia on her home 5LNC Improved with NMT and steroids Ambulatory w/o desat Workup stable Feeling better Stable for discharge with close o/p f/u Steroids/azithro/bronchodilator for home  The patient improved significantly and was discharged in stable condition. Detailed discussions were had with the patient regarding current findings, and need for close f/u with PCP or on call doctor. The patient has been instructed to return immediately if the symptoms worsen in any way for re-evaluation. Patient verbalized understanding and is in agreement with current care plan. All questions answered prior to discharge.    Sloan Leiter, DO 08/23/22 2358

## 2022-08-23 NOTE — ED Notes (Signed)
Pt ambulated with a walker down the hall approx. 50 feet on 3 L of O2. Sats maintained between 80-85 while ambulating with notable wheezing but quickly returned to 100 once seated in bed.

## 2022-08-23 NOTE — Discharge Instructions (Signed)
Take your prednisone and azithromycin as directed. Follow up with your primary care physician as soon as possible. Monitor for any signs of worsening including increased shortness of breath, wheezing, fevers, or changes in mental status.  Return to the ED for any worsening symptoms or further concerns.

## 2022-08-23 NOTE — ED Provider Notes (Signed)
[facility] Continuous Assessment Admission H&P  Date: 08/24/22 Patient Name: Rachel Nolan MRN: [id number] Chief Complaint: suicidal thought with depression   Diagnoses:  Final diagnoses:  Homelessness  Malingering  Suicidal ideation  Cocaine abuse (HCC)  Marijuana abuse    HPI: Rachel Nolan, 52 y/o female,  With a history of anxiety, chronic cocaine use,  and alcohol abuse.  Presented to [facility] voluntarily.  Patient was just discharged from [facility] ED after having for respiratory problem, pt is currently on 3 L oxygen in addition to breathing treatment.  According to the patient the ED send her over here.  Per the patient she has suicidal thoughts and increased depression according to her she was staying with her sister and her sister continued to do heroin.  According to the patient she needed mental help because she does not want to go back to that environment.  Per the patient she is currently homeless and need help tonight.  Patient denies access to guns.   Face-to-face observation of patient, patient is alert and oriented x 4, speech is clear, maintaining eye contact.  Patient can become tearful at times crying and sobbing.  Patient endorsed suicidal ideation but did not given any definite plan.  Denies SI, HI, AVH or paranoia.  According to patient she drinks corn liquor otherwise called moonshine.  Patient reports she smoked crack cocaine yesterday.  According to patient she smoke marijuana and cigarettes daily.  Per the patient she does not want to go back to her sister house because they continue to use heroin.   Pt denies access too guns at this time. Pt does not seem to be influence by external or internal stimuli at this time.     Recommend inpatient observation   Total Time spent with patient: 20 minutes  Musculoskeletal  Strength & Muscle Tone: within normal limits Gait & Station: normal Patient leans: N/A  Psychiatric Specialty Exam  Presentation General  Appearance:  Casual  Eye Contact: Good  Speech: Clear and Coherent  Speech Volume: Normal  Handedness: Right   Mood and Affect  Mood: Anxious; Angry  Affect:No data recorded  Thought Process  Thought Processes: Linear  Descriptions of Associations:Loose  Orientation:Full (Time, Place and Person)  Thought Content:WDL  Diagnosis of Schizophrenia or Schizoaffective disorder in past: No data recorded  Hallucinations:Hallucinations: None  Ideas of Reference:None  Suicidal Thoughts:Suicidal Thoughts: No  Homicidal Thoughts:Homicidal Thoughts: No   Sensorium  Memory: Immediate Fair  Judgment: Fair  Insight: Lacking   Executive Functions  Concentration: Fair  Attention Span: Fair  Recall: Good  Fund of Knowledge: Good  Language: Good   Psychomotor Activity  Psychomotor Activity: Psychomotor Activity: Normal   Assets  Assets: Desire for Improvement; Housing; Resilience   Sleep  Sleep: Sleep: Fair Number of Hours of Sleep: 6   Nutritional Assessment (For OBS and FBC admissions only) Has the patient had a weight loss or gain of 10 pounds or more in the last 3 months?: No Has the patient had a decrease in food intake/or appetite?: No Does the patient have dental problems?: No Does the patient have eating habits or behaviors that may be indicators of an eating disorder including binging or inducing vomiting?: No Has the patient recently lost weight without trying?: 0 Has the patient been eating poorly because of a decreased appetite?: 0 Malnutrition Screening Tool Score: 0    Physical Exam HENT:     Head: Normocephalic.     Nose: Nose normal.  Eyes:  Pupils: Pupils are equal, round, and reactive to light.  Cardiovascular:     Rate and Rhythm: Normal rate.  Pulmonary:     Effort: Pulmonary effort is normal.  Musculoskeletal:        General: Normal range of motion.     Cervical back: Normal range of motion.   Neurological:     General: No focal deficit present.     Mental Status: She is alert.  Psychiatric:        Mood and Affect: Mood normal.        Behavior: Behavior normal.        Thought Content: Thought content normal.        Judgment: Judgment normal.    Review of Systems  Constitutional: Negative.   HENT: Negative.    Eyes: Negative.   Respiratory: Negative.    Cardiovascular: Negative.   Gastrointestinal: Negative.   Genitourinary: Negative.   Musculoskeletal: Negative.   Skin: Negative.   Neurological: Negative.   Psychiatric/Behavioral:  Positive for substance abuse and suicidal ideas. The patient is nervous/anxious.     Blood pressure (!) 123/94, pulse (!) 107, temperature 98.3 F (36.8 C), temperature source Oral, resp. rate 20, last menstrual period 04/19/2018, SpO2 98%. There is no height or weight on file to calculate BMI.  Past Psychiatric History: anxiety, depression,  SI,  alcohol abuse,  cocaine abuse   Is the patient at risk to self? Yes  Has the patient been a risk to self in the past 6 months? No .    Has the patient been a risk to self within the distant past? Yes   Is the patient a risk to others? No   Has the patient been a risk to others in the past 6 months? No   Has the patient been a risk to others within the distant past? No   Past Medical History: see chart   Family History: unknown   Social History: ETOH, substance abuse  Last Labs:  Admission on 08/23/2022  Component Date Value Ref Range Status   Alcohol, Ethyl (B) 08/23/2022 <10  <10 mg/dL Final   Comment: (NOTE) Lowest detectable limit for serum alcohol is 10 mg/dL.  For medical purposes only. Performed at [facility], [address] [place], [place] [address]   Admission on 08/23/2022, Discharged on 08/23/2022  Component Date Value Ref Range Status   SARS Coronavirus 2 by RT PCR 08/23/2022 NEGATIVE  NEGATIVE Final   Influenza A by PCR 08/23/2022 NEGATIVE  NEGATIVE Final    Influenza B by PCR 08/23/2022 NEGATIVE  NEGATIVE Final   Comment: (NOTE) The Xpert Xpress SARS-CoV-2/FLU/RSV plus assay is intended as an aid in the diagnosis of influenza from Nasopharyngeal swab specimens and should not be used as a sole basis for treatment. Nasal washings and aspirates are unacceptable for Xpert Xpress SARS-CoV-2/FLU/RSV testing.  Fact Sheet for Patients: BloggerCourse.com  Fact Sheet for Healthcare Providers: SeriousBroker.it  This test is not yet approved or cleared by the [place] FDA and has been authorized for detection and/or diagnosis of SARS-CoV-2 by FDA under an Emergency Use Authorization (EUA). This EUA will remain in effect (meaning this test can be used) for the duration of the COVID-19 declaration under Section 564(b)(1) of the Act, 21 U.S.C. section 360bbb-3(b)(1), unless the authorization is terminated or revoked.     Resp Syncytial Virus by PCR 08/23/2022 NEGATIVE  NEGATIVE Final   Comment: (NOTE) Fact Sheet for Patients: BloggerCourse.com  Fact Sheet for  Healthcare Providers: SeriousBroker.it  This test is not yet approved or cleared by the [place] and has been authorized for detection and/or diagnosis of SARS-CoV-2 by FDA under an Emergency Use Authorization (EUA). This EUA will remain in effect (meaning this test can be used) for the duration of the COVID-19 declaration under Section 564(b)(1) of the Act, 21 U.S.C. section 360bbb-3(b)(1), unless the authorization is terminated or revoked.  Performed at [facility], [address] [place], [place] [address]    Sodium 08/23/2022 137  135 - 145 mmol/L Final   Potassium 08/23/2022 3.8  3.5 - 5.1 mmol/L Final   Chloride 08/23/2022 104  98 - 111 mmol/L Final   CO2 08/23/2022 24  22 - 32 mmol/L Final   Glucose, Bld 08/23/2022 98  70 - 99 mg/dL Final   Glucose  reference range applies only to samples taken after fasting for at least 8 hours.   BUN 08/23/2022 10  6 - 20 mg/dL Final   Creatinine, Ser 08/23/2022 0.66  0.44 - 1.00 mg/dL Final   Calcium 21/30/8657 9.0  8.9 - 10.3 mg/dL Final   Total Protein 84/69/6295 6.6  6.5 - 8.1 g/dL Final   Albumin 28/41/3244 3.5  3.5 - 5.0 g/dL Final   AST 01/20/7251 15  15 - 41 U/L Final   ALT 08/23/2022 9  0 - 44 U/L Final   Alkaline Phosphatase 08/23/2022 68  38 - 126 U/L Final   Total Bilirubin 08/23/2022 0.6  0.3 - 1.2 mg/dL Final   GFR, Estimated 08/23/2022 >60  >60 mL/min Final   Comment: (NOTE) Calculated using the CKD-EPI Creatinine Equation (2021)    Anion gap 08/23/2022 9  5 - 15 Final   Performed at [facility], [address], [place], [place] [address]   WBC 08/23/2022 4.5  4.0 - 10.5 K/uL Final   RBC 08/23/2022 4.38  3.87 - 5.11 MIL/uL Final   Hemoglobin 08/23/2022 13.3  12.0 - 15.0 g/dL Final   HCT 34/74/2595 40.5  36.0 - 46.0 % Final   MCV 08/23/2022 92.5  80.0 - 100.0 fL Final   MCH 08/23/2022 30.4  26.0 - 34.0 pg Final   MCHC 08/23/2022 32.8  30.0 - 36.0 g/dL Final   RDW 63/87/5643 14.7  11.5 - 15.5 % Final   Platelets 08/23/2022 236  150 - 400 K/uL Final   nRBC 08/23/2022 0.0  0.0 - 0.2 % Final   Performed at [facility], [address] [place], [place] [address]   Troponin I (High Sensitivity) 08/23/2022 5  <18 ng/L Final   Comment: (NOTE) Elevated high sensitivity troponin I (hsTnI) values and significant  changes across serial measurements may suggest ACS but many other  chronic and acute conditions are known to elevate hsTnI results.  Refer to the "Links" section for chest pain algorithms and additional  guidance. Performed at [facility], [address] [place], [place] [address]    B Natriuretic Peptide 08/23/2022 11.8  0.0 - 100.0 pg/mL Final   Performed at [facility], [address], [place], [place] [address]   pH, Ven 08/23/2022 7.445 (H)   7.25 - 7.43 Final   pCO2, Ven 08/23/2022 41.1 (L)  44 - 60 mmHg Final   pO2, Ven 08/23/2022 68 (H)  32 - 45 mmHg Final   Bicarbonate 08/23/2022 28.3 (H)  20.0 - 28.0 mmol/L Final   TCO2 08/23/2022 30  22 - 32 mmol/L  Final   O2 Saturation 08/23/2022 94  % Final   Acid-Base Excess 08/23/2022 4.0 (H)  0.0 - 2.0 mmol/L Final   Sodium 08/23/2022 139  135 - 145 mmol/L Final   Potassium 08/23/2022 3.8  3.5 - 5.1 mmol/L Final   Calcium, Ion 08/23/2022 1.14 (L)  1.15 - 1.40 mmol/L Final   HCT 08/23/2022 38.0  36.0 - 46.0 % Final   Hemoglobin 08/23/2022 12.9  12.0 - 15.0 g/dL Final   Sample type 40/98/1191 VENOUS   Final   Troponin I (High Sensitivity) 08/23/2022 4  <18 ng/L Final   Comment: (NOTE) Elevated high sensitivity troponin I (hsTnI) values and significant  changes across serial measurements may suggest ACS but many other  chronic and acute conditions are known to elevate hsTnI results.  Refer to the "Links" section for chest pain algorithms and additional  guidance. Performed at [facility], [address], [place], [place] [address]   Admission on 08/04/2022, Discharged on 08/07/2022  Component Date Value Ref Range Status   WBC 08/04/2022 4.0  4.0 - 10.5 K/uL Final   RBC 08/04/2022 4.22  3.87 - 5.11 MIL/uL Final   Hemoglobin 08/04/2022 12.8  12.0 - 15.0 g/dL Final   HCT 56/21/3086 39.3  36.0 - 46.0 % Final   MCV 08/04/2022 93.1  80.0 - 100.0 fL Final   MCH 08/04/2022 30.3  26.0 - 34.0 pg Final   MCHC 08/04/2022 32.6  30.0 - 36.0 g/dL Final   RDW 57/84/6962 14.8  11.5 - 15.5 % Final   Platelets 08/04/2022 201  150 - 400 K/uL Final   nRBC 08/04/2022 0.0  0.0 - 0.2 % Final   Neutrophils Relative % 08/04/2022 37  % Final   Neutro Abs 08/04/2022 1.5 (L)  1.7 - 7.7 K/uL Final   Lymphocytes Relative 08/04/2022 43  % Final   Lymphs Abs 08/04/2022 1.7  0.7 - 4.0 K/uL Final   Monocytes Relative 08/04/2022 13  % Final   Monocytes Absolute 08/04/2022 0.5  0.1 - 1.0 K/uL Final    Eosinophils Relative 08/04/2022 6  % Final   Eosinophils Absolute 08/04/2022 0.2  0.0 - 0.5 K/uL Final   Basophils Relative 08/04/2022 1  % Final   Basophils Absolute 08/04/2022 0.0  0.0 - 0.1 K/uL Final   Immature Granulocytes 08/04/2022 0  % Final   Abs Immature Granulocytes 08/04/2022 0.01  0.00 - 0.07 K/uL Final   Performed at [facility], [address], [place], [place] [address]   pH, Ven 08/04/2022 7.442 (H)  7.25 - 7.43 Final   pCO2, Ven 08/04/2022 39.9 (L)  44 - 60 mmHg Final   pO2, Ven 08/04/2022 56 (H)  32 - 45 mmHg Final   Bicarbonate 08/04/2022 27.2  20.0 - 28.0 mmol/L Final   TCO2 08/04/2022 28  22 - 32 mmol/L Final   O2 Saturation 08/04/2022 90  % Final   Acid-Base Excess 08/04/2022 3.0 (H)  0.0 - 2.0 mmol/L Final   Sodium 08/04/2022 141  135 - 145 mmol/L Final   Potassium 08/04/2022 4.1  3.5 - 5.1 mmol/L Final   Calcium, Ion 08/04/2022 1.12 (L)  1.15 - 1.40 mmol/L Final   HCT 08/04/2022 40.0  36.0 - 46.0 % Final   Hemoglobin 08/04/2022 13.6  12.0 - 15.0 g/dL Final   Sample type 13/24/4010 VENOUS   Final   Sodium 08/04/2022 140  135 - 145 mmol/L Final   Potassium 08/04/2022 3.9  3.5 -  5.1 mmol/L Final   Chloride 08/04/2022 105  98 - 111 mmol/L Final   CO2 08/04/2022 26  22 - 32 mmol/L Final   Glucose, Bld 08/04/2022 114 (H)  70 - 99 mg/dL Final   Glucose reference range applies only to samples taken after fasting for at least 8 hours.   BUN 08/04/2022 14  6 - 20 mg/dL Final   Creatinine, Ser 08/04/2022 0.70  0.44 - 1.00 mg/dL Final   Calcium 16/10/9602 9.3  8.9 - 10.3 mg/dL Final   GFR, Estimated 08/04/2022 >60  >60 mL/min Final   Comment: (NOTE) Calculated using the CKD-EPI Creatinine Equation (2021)    Anion gap 08/04/2022 9  5 - 15 Final   Performed at [facility], [address] [place], [place] [address]   WBC 08/05/2022 4.2  4.0 - 10.5 K/uL Final   RBC 08/05/2022 4.38  3.87 - 5.11 MIL/uL Final   Hemoglobin 08/05/2022 13.3  12.0 - 15.0 g/dL  Final   HCT 11/91/4782 40.8  36.0 - 46.0 % Final   MCV 08/05/2022 93.2  80.0 - 100.0 fL Final   MCH 08/05/2022 30.4  26.0 - 34.0 pg Final   MCHC 08/05/2022 32.6  30.0 - 36.0 g/dL Final   RDW 95/62/1308 14.8  11.5 - 15.5 % Final   Platelets 08/05/2022 189  150 - 400 K/uL Final   nRBC 08/05/2022 0.0  0.0 - 0.2 % Final   Performed at [facility], [address] [place], [place] [address]   Sodium 08/05/2022 135  135 - 145 mmol/L Final   Potassium 08/05/2022 3.5  3.5 - 5.1 mmol/L Final   Chloride 08/05/2022 102  98 - 111 mmol/L Final   CO2 08/05/2022 24  22 - 32 mmol/L Final   Glucose, Bld 08/05/2022 170 (H)  70 - 99 mg/dL Final   Glucose reference range applies only to samples taken after fasting for at least 8 hours.   BUN 08/05/2022 13  6 - 20 mg/dL Final   Creatinine, Ser 08/05/2022 0.72  0.44 - 1.00 mg/dL Final   Calcium 69/62/9528 8.9  8.9 - 10.3 mg/dL Final   GFR, Estimated 08/05/2022 >60  >60 mL/min Final   Comment: (NOTE) Calculated using the CKD-EPI Creatinine Equation (2021)    Anion gap 08/05/2022 9  5 - 15 Final   Performed at [facility], [address] [place], [place] [address]   Magnesium 08/05/2022 2.4  1.7 - 2.4 mg/dL Final   Performed at [facility], [address], [place], [place] [address]   Hgb A1c MFr Bld 08/05/2022 5.5  4.8 - 5.6 % Final   Comment: (NOTE) Pre diabetes:          5.7%-6.4%  Diabetes:              >6.4%  Glycemic control for   <7.0% adults with diabetes    Mean Plasma Glucose 08/05/2022 111.15  mg/dL Final   Performed at [facility], [address], [place], [place] [address]   Glucose-Capillary 08/05/2022 122 (H)  70 - 99 mg/dL Final   Glucose reference range applies only to samples taken after fasting for at least 8 hours.   Sodium 08/06/2022 140  135 - 145 mmol/L Final   Potassium 08/06/2022 3.2 (L)  3.5 - 5.1 mmol/L Final   Chloride 08/06/2022 107  98 - 111 mmol/L Final   CO2 08/06/2022 25  22 - 32 mmol/L  Final   Glucose, Bld 08/06/2022 120 (H)  70 - 99 mg/dL Final   Glucose reference range applies only to samples taken after fasting for at least 8 hours.   BUN 08/06/2022 19  6 - 20 mg/dL Final   Creatinine, Ser 08/06/2022 0.56  0.44 - 1.00 mg/dL Final   Calcium 36/64/4034 8.8 (L)  8.9 - 10.3 mg/dL Final   GFR, Estimated 08/06/2022 >60  >60 mL/min Final   Comment: (NOTE) Calculated using the CKD-EPI Creatinine Equation (2021)    Anion gap 08/06/2022 8  5 - 15 Final   Performed at [facility], [address], [place], [place] [address]   WBC 08/06/2022 10.5  4.0 - 10.5 K/uL Final   RBC 08/06/2022 3.74 (L)  3.87 - 5.11 MIL/uL Final   Hemoglobin 08/06/2022 11.6 (L)  12.0 - 15.0 g/dL Final   HCT 56/38/7564 35.6 (L)  36.0 - 46.0 % Final   MCV 08/06/2022 95.2  80.0 - 100.0 fL Final   MCH 08/06/2022 31.0  26.0 - 34.0 pg Final   MCHC 08/06/2022 32.6  30.0 - 36.0 g/dL Final   RDW 33/29/5188 15.0  11.5 - 15.5 % Final   Platelets 08/06/2022 177  150 - 400 K/uL Final   nRBC 08/06/2022 0.0  0.0 - 0.2 % Final   Performed at [facility], [address] [place], [place] [address]   Glucose-Capillary 08/05/2022 145 (H)  70 - 99 mg/dL Final   Glucose reference range applies only to samples taken after fasting for at least 8 hours.   Glucose-Capillary 08/06/2022 119 (H)  70 - 99 mg/dL Final   Glucose reference range applies only to samples taken after fasting for at least 8 hours.   Glucose-Capillary 08/06/2022 140 (H)  70 - 99 mg/dL Final   Glucose reference range applies only to samples taken after fasting for at least 8 hours.   Glucose-Capillary 08/06/2022 149 (H)  70 - 99 mg/dL Final   Glucose reference range applies only to samples taken after fasting for at least 8 hours.   WBC 08/07/2022 10.0  4.0 - 10.5 K/uL Final   RBC 08/07/2022 4.07  3.87 - 5.11 MIL/uL Final   Hemoglobin 08/07/2022 12.3  12.0 - 15.0 g/dL Final   HCT 63/01/6008 38.0  36.0 - 46.0 % Final   MCV 08/07/2022 93.4   80.0 - 100.0 fL Final   MCH 08/07/2022 30.2  26.0 - 34.0 pg Final   MCHC 08/07/2022 32.4  30.0 - 36.0 g/dL Final   RDW 93/23/5573 15.2  11.5 - 15.5 % Final   Platelets 08/07/2022 189  150 - 400 K/uL Final   nRBC 08/07/2022 0.0  0.0 - 0.2 % Final   Performed at [facility], [address] [place], [place] [address]   Sodium 08/07/2022 138  135 - 145 mmol/L Final   Potassium 08/07/2022 4.6  3.5 - 5.1 mmol/L Final   HEMOLYSIS AT THIS LEVEL MAY AFFECT RESULT   Chloride 08/07/2022 106  98 - 111 mmol/L Final   CO2 08/07/2022 25  22 - 32 mmol/L Final   Glucose, Bld 08/07/2022 115 (H)  70 - 99 mg/dL Final   Glucose reference range applies only to samples taken after fasting for at least 8 hours.   BUN 08/07/2022 18  6 - 20 mg/dL Final   Creatinine, Ser 08/07/2022 0.87  0.44 - 1.00 mg/dL Final   Calcium 42/70/6237 9.0  8.9 - 10.3 mg/dL  Final   Total Protein 08/07/2022 6.1 (L)  6.5 - 8.1 g/dL Final   Albumin 16/10/9602 3.2 (L)  3.5 - 5.0 g/dL Final   AST 54/09/8117 17  15 - 41 U/L Final   HEMOLYSIS AT THIS LEVEL MAY AFFECT RESULT   ALT 08/07/2022 14  0 - 44 U/L Final   HEMOLYSIS AT THIS LEVEL MAY AFFECT RESULT   Alkaline Phosphatase 08/07/2022 43  38 - 126 U/L Final   Total Bilirubin 08/07/2022 0.6  0.3 - 1.2 mg/dL Final   HEMOLYSIS AT THIS LEVEL MAY AFFECT RESULT   GFR, Estimated 08/07/2022 >60  >60 mL/min Final   Comment: (NOTE) Calculated using the CKD-EPI Creatinine Equation (2021)    Anion gap 08/07/2022 7  5 - 15 Final   Performed at [facility], [address] [place], [place] [address]   Glucose-Capillary 08/06/2022 111 (H)  70 - 99 mg/dL Final   Glucose reference range applies only to samples taken after fasting for at least 8 hours.   Glucose-Capillary 08/07/2022 116 (H)  70 - 99 mg/dL Final   Glucose reference range applies only to samples taken after fasting for at least 8 hours.   Glucose-Capillary 08/07/2022 202 (H)  70 - 99 mg/dL Final   Glucose reference  range applies only to samples taken after fasting for at least 8 hours.  Admission on 06/29/2022, Discharged on 06/29/2022  Component Date Value Ref Range Status   WBC 06/29/2022 4.6  4.0 - 10.5 K/uL Final   RBC 06/29/2022 4.30  3.87 - 5.11 MIL/uL Final   Hemoglobin 06/29/2022 13.0  12.0 - 15.0 g/dL Final   HCT 95/62/1308 40.3  36.0 - 46.0 % Final   MCV 06/29/2022 93.7  80.0 - 100.0 fL Final   MCH 06/29/2022 30.2  26.0 - 34.0 pg Final   MCHC 06/29/2022 32.3  30.0 - 36.0 g/dL Final   RDW 65/78/4696 15.0  11.5 - 15.5 % Final   Platelets 06/29/2022 266  150 - 400 K/uL Final   nRBC 06/29/2022 0.0  0.0 - 0.2 % Final   Performed at [facility], [address] [place], [place] [address]   Troponin I (High Sensitivity) 06/29/2022 6  <18 ng/L Final   Comment: (NOTE) Elevated high sensitivity troponin I (hsTnI) values and significant  changes across serial measurements may suggest ACS but many other  chronic and acute conditions are known to elevate hsTnI results.  Refer to the "Links" section for chest pain algorithms and additional  guidance. Performed at [name], [address], [place], [place] [address]    Sodium 06/29/2022 140  135 - 145 mmol/L Final   Potassium 06/29/2022 3.7  3.5 - 5.1 mmol/L Final   Chloride 06/29/2022 103  98 - 111 mmol/L Final   CO2 06/29/2022 24  22 - 32 mmol/L Final   Glucose, Bld 06/29/2022 99  70 - 99 mg/dL Final   Glucose reference range applies only to samples taken after fasting for at least 8 hours.   BUN 06/29/2022 13  6 - 20 mg/dL Final   Creatinine, Ser 06/29/2022 0.72  0.44 - 1.00 mg/dL Final   Calcium 40/10/2723 9.3  8.9 - 10.3 mg/dL Final   Total Protein 36/64/4034 6.7  6.5 - 8.1 g/dL Final   Albumin 74/25/9563 3.6  3.5 - 5.0 g/dL Final   AST 87/56/4332 14 (L)  15 - 41 U/L Final   ALT 06/29/2022 8  0 - 44  U/L Final   Alkaline Phosphatase 06/29/2022 54  38 - 126 U/L Final   Total Bilirubin 06/29/2022 0.3  0.3 - 1.2 mg/dL Final    GFR, Estimated 06/29/2022 >60  >60 mL/min Final   Comment: (NOTE) Calculated using the CKD-EPI Creatinine Equation (2021)    Anion gap 06/29/2022 13  5 - 15 Final   Performed at [facility], [address] [place], [place] [address]   B Natriuretic Peptide 06/29/2022 6.0  0.0 - 100.0 pg/mL Final   Performed at [facility], [address], [place], [place] [address]   Magnesium 06/29/2022 2.2  1.7 - 2.4 mg/dL Final   Performed at [facility], [address] [place], [place] [address]   SARS Coronavirus 2 by RT PCR 06/29/2022 NEGATIVE  NEGATIVE Final   Influenza A by PCR 06/29/2022 NEGATIVE  NEGATIVE Final   Influenza B by PCR 06/29/2022 NEGATIVE  NEGATIVE Final   Comment: (NOTE) The Xpert Xpress SARS-CoV-2/FLU/RSV plus assay is intended as an aid in the diagnosis of influenza from Nasopharyngeal swab specimens and should not be used as a sole basis for treatment. Nasal washings and aspirates are unacceptable for Xpert Xpress SARS-CoV-2/FLU/RSV testing.  Fact Sheet for Patients: BloggerCourse.com  Fact Sheet for Healthcare Providers: SeriousBroker.it  This test is not yet approved or cleared by the [place] FDA and has been authorized for detection and/or diagnosis of SARS-CoV-2 by FDA under an Emergency Use Authorization (EUA). This EUA will remain in effect (meaning this test can be used) for the duration of the COVID-19 declaration under Section 564(b)(1) of the Act, 21 U.S.C. section 360bbb-3(b)(1), unless the authorization is terminated or revoked.     Resp Syncytial Virus by PCR 06/29/2022 NEGATIVE  NEGATIVE Final   Comment: (NOTE) Fact Sheet for Patients: BloggerCourse.com  Fact Sheet for Healthcare Providers: SeriousBroker.it  This test is not yet approved or cleared by the [place] FDA and has been authorized for detection and/or  diagnosis of SARS-CoV-2 by FDA under an Emergency Use Authorization (EUA). This EUA will remain in effect (meaning this test can be used) for the duration of the COVID-19 declaration under Section 564(b)(1) of the Act, 21 U.S.C. section 360bbb-3(b)(1), unless the authorization is terminated or revoked.  Performed at [facility] Lab, [address] [place], [place] [address]    pH, Ven 06/29/2022 7.360  7.25 - 7.43 Final   pCO2, Ven 06/29/2022 53.7  44 - 60 mmHg Final   pO2, Ven 06/29/2022 48 (H)  32 - 45 mmHg Final   Bicarbonate 06/29/2022 30.3 (H)  20.0 - 28.0 mmol/L Final   TCO2 06/29/2022 32  22 - 32 mmol/L Final   O2 Saturation 06/29/2022 81  % Final   Acid-Base Excess 06/29/2022 3.0 (H)  0.0 - 2.0 mmol/L Final   Sodium 06/29/2022 139  135 - 145 mmol/L Final   Potassium 06/29/2022 5.6 (H)  3.5 - 5.1 mmol/L Final   Calcium, Ion 06/29/2022 1.12 (L)  1.15 - 1.40 mmol/L Final   HCT 06/29/2022 43.0  36.0 - 46.0 % Final   Hemoglobin 06/29/2022 14.6  12.0 - 15.0 g/dL Final   Sample type 52/84/1324 VENOUS   Final   Troponin I (High Sensitivity) 06/29/2022 4  <18 ng/L Final   Comment: (NOTE) Elevated high sensitivity troponin I (hsTnI) values and significant  changes across serial measurements may suggest ACS but many other  chronic and acute conditions are known to elevate hsTnI results.  Refer to the "Links" section for chest pain algorithms and additional  guidance. Performed at [facility], [address], [place], [place] [address]   Admission on 05/23/2022, Discharged on 05/26/2022  Component Date Value Ref Range Status   Sodium 05/23/2022 140  135 - 145 mmol/L Final   Potassium 05/23/2022 3.6  3.5 - 5.1 mmol/L Final   Chloride 05/23/2022 104  98 - 111 mmol/L Final   CO2 05/23/2022 27  22 - 32 mmol/L Final   Glucose, Bld 05/23/2022 122 (H)  70 - 99 mg/dL Final   Glucose reference range applies only to samples taken after fasting for at least 8 hours.   BUN  05/23/2022 12  6 - 20 mg/dL Final   Creatinine, Ser 05/23/2022 0.77  0.44 - 1.00 mg/dL Final   Calcium 19/14/7829 9.0  8.9 - 10.3 mg/dL Final   Total Protein 56/21/3086 6.6  6.5 - 8.1 g/dL Final   Albumin 57/84/6962 3.1 (L)  3.5 - 5.0 g/dL Final   AST 95/28/4132 17  15 - 41 U/L Final   ALT 05/23/2022 12  0 - 44 U/L Final   Alkaline Phosphatase 05/23/2022 74  38 - 126 U/L Final   Total Bilirubin 05/23/2022 0.5  0.3 - 1.2 mg/dL Final   GFR, Estimated 05/23/2022 >60  >60 mL/min Final   Comment: (NOTE) Calculated using the CKD-EPI Creatinine Equation (2021)    Anion gap 05/23/2022 9  5 - 15 Final   Performed at [facility], [address], [place], [place] [address]   WBC 05/23/2022 12.8 (H)  4.0 - 10.5 K/uL Final   RBC 05/23/2022 3.69 (L)  3.87 - 5.11 MIL/uL Final   Hemoglobin 05/23/2022 11.6 (L)  12.0 - 15.0 g/dL Final   HCT 27/25/3664 34.5 (L)  36.0 - 46.0 % Final   MCV 05/23/2022 93.5  80.0 - 100.0 fL Final   MCH 05/23/2022 31.4  26.0 - 34.0 pg Final   MCHC 05/23/2022 33.6  30.0 - 36.0 g/dL Final   RDW 40/34/7425 14.9  11.5 - 15.5 % Final   Platelets 05/23/2022 254  150 - 400 K/uL Final   nRBC 05/23/2022 0.0  0.0 - 0.2 % Final   Neutrophils Relative % 05/23/2022 62  % Final   Neutro Abs 05/23/2022 7.9 (H)  1.7 - 7.7 K/uL Final   Lymphocytes Relative 05/23/2022 25  % Final   Lymphs Abs 05/23/2022 3.2  0.7 - 4.0 K/uL Final   Monocytes Relative 05/23/2022 12  % Final   Monocytes Absolute 05/23/2022 1.6 (H)  0.1 - 1.0 K/uL Final   Eosinophils Relative 05/23/2022 1  % Final   Eosinophils Absolute 05/23/2022 0.1  0.0 - 0.5 K/uL Final   Basophils Relative 05/23/2022 0  % Final   Basophils Absolute 05/23/2022 0.0  0.0 - 0.1 K/uL Final   Immature Granulocytes 05/23/2022 0  % Final   Abs Immature Granulocytes 05/23/2022 0.05  0.00 - 0.07 K/uL Final   Performed at [facility], [address], [place], [place] [address]   Troponin I (High Sensitivity) 05/23/2022 7  <18 ng/L  Final   Comment: (NOTE) Elevated high sensitivity troponin I (hsTnI) values and significant  changes across serial measurements may suggest ACS but many other  chronic and acute conditions are known to elevate hsTnI results.  Refer to the "Links" section for chest pain algorithms and additional  guidance. Performed at [facility], [address] [place],  [place] [address]    SARS Coronavirus 2 by RT PCR 05/23/2022 NEGATIVE  NEGATIVE Final   Performed at [facility], [address], [place], [place] [address]   pH, Ven 05/23/2022 7.439 (H)  7.25 - 7.43 Final   pCO2, Ven 05/23/2022 45.8  44 - 60 mmHg Final   pO2, Ven 05/23/2022 71 (H)  32 - 45 mmHg Final   Bicarbonate 05/23/2022 31.0 (H)  20.0 - 28.0 mmol/L Final   TCO2 05/23/2022 32  22 - 32 mmol/L Final   O2 Saturation 05/23/2022 95  % Final   Acid-Base Excess 05/23/2022 6.0 (H)  0.0 - 2.0 mmol/L Final   Sodium 05/23/2022 141  135 - 145 mmol/L Final   Potassium 05/23/2022 3.6  3.5 - 5.1 mmol/L Final   Calcium, Ion 05/23/2022 1.17  1.15 - 1.40 mmol/L Final   HCT 05/23/2022 36.0  36.0 - 46.0 % Final   Hemoglobin 05/23/2022 12.2  12.0 - 15.0 g/dL Final   Sample type 30/16/0109 VENOUS   Final   Lactic Acid, Venous 05/23/2022 1.0  0.5 - 1.9 mmol/L Final   Performed at [facility], [address], [place], [place] [address]   Lactic Acid, Venous 05/23/2022 0.9  0.5 - 1.9 mmol/L Final   Performed at [facility], [address], [place], [place] [address]   Prothrombin Time 05/23/2022 14.1  11.4 - 15.2 seconds Final   INR 05/23/2022 1.1  0.8 - 1.2 Final   Comment: (NOTE) INR goal varies based on device and disease states. Performed at [facility], [address] [place], [place] [address]    aPTT 05/23/2022 30  24 - 36 seconds Final   Performed at [facility], [address], [place], [place] [address]   Specimen Description 05/23/2022 BLOOD SITE NOT SPECIFIED   Final   Special Requests  05/23/2022 BOTTLES DRAWN AEROBIC AND ANAEROBIC Blood Culture adequate volume   Final   Culture 05/23/2022    Final                   Value:NO GROWTH 5 DAYS Performed at [facility], [address] [place], [place] [address]    Report Status 05/23/2022 05/28/2022 FINAL   Final   Specimen Description 05/23/2022 BLOOD LEFT FOREARM   Final   Special Requests 05/23/2022 BOTTLES DRAWN AEROBIC AND ANAEROBIC Blood Culture adequate volume   Final   Culture 05/23/2022    Final                   Value:NO GROWTH 5 DAYS Performed at [facility], [address] [place], [place] [address]    Report Status 05/23/2022 05/28/2022 FINAL   Final   Troponin I (High Sensitivity) 05/23/2022 6  <18 ng/L Final   Comment: (NOTE) Elevated high sensitivity troponin I (hsTnI) values and significant  changes across serial measurements may suggest ACS but many other  chronic and acute conditions are known to elevate hsTnI results.  Refer to the "Links" section for chest pain algorithms and additional  guidance. Performed at [facility], [address] [place], [place] [address]    HIV Screen 4th Generation wRfx 05/24/2022 Non Reactive  Non Reactive Final   Performed at [facility], [address], [place], [place] [address]   Sodium 05/24/2022 140  135 - 145 mmol/L Final   Potassium 05/24/2022 3.4 (L)  3.5 - 5.1  mmol/L Final   Chloride 05/24/2022 105  98 - 111 mmol/L Final   CO2 05/24/2022 28  22 - 32 mmol/L Final   Glucose, Bld 05/24/2022 143 (H)  70 - 99 mg/dL Final   Glucose reference range applies only to samples taken after fasting for at least 8 hours.   BUN 05/24/2022 13  6 - 20 mg/dL Final   Creatinine, Ser 05/24/2022 0.64  0.44 - 1.00 mg/dL Final   Calcium 16/10/9602 8.7 (L)  8.9 - 10.3 mg/dL Final   GFR, Estimated 05/24/2022 >60  >60 mL/min Final   Comment: (NOTE) Calculated using the CKD-EPI Creatinine Equation (2021)    Anion gap 05/24/2022 7  5 - 15 Final    Performed at [facility], [address], [place], [place] [address]   WBC 05/24/2022 17.7 (H)  4.0 - 10.5 K/uL Final   RBC 05/24/2022 3.38 (L)  3.87 - 5.11 MIL/uL Final   Hemoglobin 05/24/2022 10.6 (L)  12.0 - 15.0 g/dL Final   HCT 11/91/4782 31.3 (L)  36.0 - 46.0 % Final   MCV 05/24/2022 92.6  80.0 - 100.0 fL Final   MCH 05/24/2022 31.4  26.0 - 34.0 pg Final   MCHC 05/24/2022 33.9  30.0 - 36.0 g/dL Final   RDW 95/62/1308 14.7  11.5 - 15.5 % Final   Platelets 05/24/2022 230  150 - 400 K/uL Final   nRBC 05/24/2022 0.0  0.0 - 0.2 % Final   Performed at [facility], [address] [place], [place] [address]   Magnesium 05/24/2022 2.1  1.7 - 2.4 mg/dL Final   Performed at [facility], [address], [place], [place] [address]   WBC 05/25/2022 14.3 (H)  4.0 - 10.5 K/uL Final   RBC 05/25/2022 3.24 (L)  3.87 - 5.11 MIL/uL Final   Hemoglobin 05/25/2022 10.0 (L)  12.0 - 15.0 g/dL Final   HCT 52/84/1324 30.5 (L)  36.0 - 46.0 % Final   MCV 05/25/2022 94.1  80.0 - 100.0 fL Final   MCH 05/25/2022 30.9  26.0 - 34.0 pg Final   MCHC 05/25/2022 32.8  30.0 - 36.0 g/dL Final   RDW 40/10/2723 14.9  11.5 - 15.5 % Final   Platelets 05/25/2022 252  150 - 400 K/uL Final   nRBC 05/25/2022 0.0  0.0 - 0.2 % Final   Performed at [facility], [address] [place], [place] [address]   Sodium 05/25/2022 139  135 - 145 mmol/L Final   Potassium 05/25/2022 3.1 (L)  3.5 - 5.1 mmol/L Final   Chloride 05/25/2022 102  98 - 111 mmol/L Final   CO2 05/25/2022 26  22 - 32 mmol/L Final   Glucose, Bld 05/25/2022 157 (H)  70 - 99 mg/dL Final   Glucose reference range applies only to samples taken after fasting for at least 8 hours.   BUN 05/25/2022 11  6 - 20 mg/dL Final   Creatinine, Ser 05/25/2022 0.70  0.44 - 1.00 mg/dL Final   Calcium 03/47/4259 8.6 (L)  8.9 - 10.3 mg/dL Final   GFR, Estimated 05/25/2022 >60  >60 mL/min Final   Comment: (NOTE) Calculated using the CKD-EPI Creatinine  Equation (2021)    Anion gap 05/25/2022 11  5 - 15 Final   Performed at [facility], [address] [place], [place] [address]   WBC 05/26/2022 13.9 (H)  4.0 - 10.5 K/uL Final   RBC 05/26/2022 3.59 (L)  3.87 - 5.11 MIL/uL Final   Hemoglobin 05/26/2022 11.2 (L)  12.0 - 15.0 g/dL Final   HCT 16/10/9602 33.8 (L)  36.0 - 46.0 % Final   MCV 05/26/2022 94.2  80.0 - 100.0 fL Final   MCH 05/26/2022 31.2  26.0 - 34.0 pg Final   MCHC 05/26/2022 33.1  30.0 - 36.0 g/dL Final   RDW 54/09/8117 14.9  11.5 - 15.5 % Final   Platelets 05/26/2022 263  150 - 400 K/uL Final   nRBC 05/26/2022 0.0  0.0 - 0.2 % Final   Performed at [facility], [address] [place], [place] [address]   Sodium 05/26/2022 138  135 - 145 mmol/L Final   Potassium 05/26/2022 4.1  3.5 - 5.1 mmol/L Final   Chloride 05/26/2022 101  98 - 111 mmol/L Final   CO2 05/26/2022 25  22 - 32 mmol/L Final   Glucose, Bld 05/26/2022 117 (H)  70 - 99 mg/dL Final   Glucose reference range applies only to samples taken after fasting for at least 8 hours.   BUN 05/26/2022 14  6 - 20 mg/dL Final   Creatinine, Ser 05/26/2022 0.66  0.44 - 1.00 mg/dL Final   Calcium 95/62/1308 8.9  8.9 - 10.3 mg/dL Final   GFR, Estimated 05/26/2022 >60  >60 mL/min Final   Comment: (NOTE) Calculated using the CKD-EPI Creatinine Equation (2021)    Anion gap 05/26/2022 12  5 - 15 Final   Performed at [facility], [address] [place], [place] [address]  Admission on 02/24/2022, Discharged on 02/28/2022  Component Date Value Ref Range Status   Sodium 02/24/2022 139  135 - 145 mmol/L Final   Potassium 02/24/2022 3.4 (L)  3.5 - 5.1 mmol/L Final   Chloride 02/24/2022 102  98 - 111 mmol/L Final   CO2 02/24/2022 27  22 - 32 mmol/L Final   Glucose, Bld 02/24/2022 120 (H)  70 - 99 mg/dL Final   Glucose reference range applies only to samples taken after fasting for at least 8 hours.   BUN 02/24/2022 13  6 - 20 mg/dL Final   Creatinine, Ser  02/24/2022 0.68  0.44 - 1.00 mg/dL Final   Calcium 69/62/9528 8.7 (L)  8.9 - 10.3 mg/dL Final   GFR, Estimated 02/24/2022 >60  >60 mL/min Final   Comment: (NOTE) Calculated using the CKD-EPI Creatinine Equation (2021)    Anion gap 02/24/2022 10  5 - 15 Final   Performed at [facility], [address], [place], [place] [address]   WBC 02/24/2022 5.6  4.0 - 10.5 K/uL Final   RBC 02/24/2022 3.77 (L)  3.87 - 5.11 MIL/uL Final   Hemoglobin 02/24/2022 11.6 (L)  12.0 - 15.0 g/dL Final   HCT 40/10/2723 36.4  36.0 - 46.0 % Final   MCV 02/24/2022 96.6  80.0 - 100.0 fL Final   MCH 02/24/2022 30.8  26.0 - 34.0 pg Final   MCHC 02/24/2022 31.9  30.0 - 36.0 g/dL Final   RDW 36/64/4034 14.9  11.5 - 15.5 % Final   Platelets 02/24/2022 190  150 - 400 K/uL Final   nRBC 02/24/2022 0.0  0.0 - 0.2 % Final   Performed at [facility], [address], [place], [place] [address]   I-stat hCG, quantitative 02/24/2022 <5.0  <5 mIU/mL Final   Comment 3 02/24/2022          Final   Comment:   GEST. AGE  CONC.  (mIU/mL)   <=1 WEEK        5 - 50     2 WEEKS       50 - 500     3 WEEKS       100 - 10,000     4 WEEKS     1,000 - 30,000        FEMALE AND NON-PREGNANT FEMALE:     LESS THAN 5 mIU/mL    Troponin I (High Sensitivity) 02/24/2022 5  <18 ng/L Final   Comment: (NOTE) Elevated high sensitivity troponin I (hsTnI) values and significant  changes across serial measurements may suggest ACS but many other  chronic and acute conditions are known to elevate hsTnI results.  Refer to the "Links" section for chest pain algorithms and additional  guidance. Performed at [facility], [address] [place], [place] [address]    Sodium 02/24/2022 141  135 - 145 mmol/L Final   Potassium 02/24/2022 3.4 (L)  3.5 - 5.1 mmol/L Final   Chloride 02/24/2022 103  98 - 111 mmol/L Final   BUN 02/24/2022 15  6 - 20 mg/dL Final   Creatinine, Ser 02/24/2022 0.60  0.44 - 1.00 mg/dL Final   Glucose, Bld  08/65/7846 114 (H)  70 - 99 mg/dL Final   Glucose reference range applies only to samples taken after fasting for at least 8 hours.   Calcium, Ion 02/24/2022 1.08 (L)  1.15 - 1.40 mmol/L Final   TCO2 02/24/2022 28  22 - 32 mmol/L Final   Hemoglobin 02/24/2022 12.2  12.0 - 15.0 g/dL Final   HCT 96/29/5284 36.0  36.0 - 46.0 % Final   pH, Ven 02/24/2022 7.381  7.25 - 7.43 Final   pCO2, Ven 02/24/2022 50.4  44 - 60 mmHg Final   pO2, Ven 02/24/2022 35  32 - 45 mmHg Final   Bicarbonate 02/24/2022 29.9 (H)  20.0 - 28.0 mmol/L Final   TCO2 02/24/2022 31  22 - 32 mmol/L Final   O2 Saturation 02/24/2022 64  % Final   Acid-Base Excess 02/24/2022 4.0 (H)  0.0 - 2.0 mmol/L Final   Sodium 02/24/2022 141  135 - 145 mmol/L Final   Potassium 02/24/2022 3.4 (L)  3.5 - 5.1 mmol/L Final   Calcium, Ion 02/24/2022 1.12 (L)  1.15 - 1.40 mmol/L Final   HCT 02/24/2022 38.0  36.0 - 46.0 % Final   Hemoglobin 02/24/2022 12.9  12.0 - 15.0 g/dL Final   Sample type 13/24/4010 VENOUS   Final   Comment 02/24/2022 NOTIFIED PHYSICIAN   Final   SARS Coronavirus 2 by RT PCR 02/24/2022 NEGATIVE  NEGATIVE Final   Influenza A by PCR 02/24/2022 NEGATIVE  NEGATIVE Final   Influenza B by PCR 02/24/2022 NEGATIVE  NEGATIVE Final   Comment: (NOTE) The Xpert Xpress SARS-CoV-2/FLU/RSV plus assay is intended as an aid in the diagnosis of influenza from Nasopharyngeal swab specimens and should not be used as a sole basis for treatment. Nasal washings and aspirates are unacceptable for Xpert Xpress SARS-CoV-2/FLU/RSV testing.  Fact Sheet for Patients: BloggerCourse.com  Fact Sheet for Healthcare Providers: SeriousBroker.it  This test is not yet approved or cleared by the [place] FDA and has been authorized for detection and/or diagnosis of SARS-CoV-2 by FDA under an Emergency Use Authorization (EUA). This EUA will remain in effect (meaning this test can be used) for the  duration of the COVID-19 declaration under Section 564(b)(1) of the Act, 21 U.S.C. section  360bbb-3(b)(1), unless the authorization is terminated or revoked.     Resp Syncytial Virus by PCR 02/24/2022 NEGATIVE  NEGATIVE Final   Comment: (NOTE) Fact Sheet for Patients: BloggerCourse.com  Fact Sheet for Healthcare Providers: SeriousBroker.it  This test is not yet approved or cleared by the [place] FDA and has been authorized for detection and/or diagnosis of SARS-CoV-2 by FDA under an Emergency Use Authorization (EUA). This EUA will remain in effect (meaning this test can be used) for the duration of the COVID-19 declaration under Section 564(b)(1) of the Act, 21 U.S.C. section 360bbb-3(b)(1), unless the authorization is terminated or revoked.  Performed at [facility], [address], [place], [place] [address]    Troponin I (High Sensitivity) 02/24/2022 3  <18 ng/L Final   Comment: (NOTE) Elevated high sensitivity troponin I (hsTnI) values and significant  changes across serial measurements may suggest ACS but many other  chronic and acute conditions are known to elevate hsTnI results.  Refer to the "Links" section for chest pain algorithms and additional  guidance. Performed at [facility], [address] [place], [place] [address]    Sodium 02/25/2022 140  135 - 145 mmol/L Final   Potassium 02/25/2022 3.6  3.5 - 5.1 mmol/L Final   Chloride 02/25/2022 103  98 - 111 mmol/L Final   CO2 02/25/2022 27  22 - 32 mmol/L Final   Glucose, Bld 02/25/2022 100 (H)  70 - 99 mg/dL Final   Glucose reference range applies only to samples taken after fasting for at least 8 hours.   BUN 02/25/2022 18  6 - 20 mg/dL Final   Creatinine, Ser 02/25/2022 0.94  0.44 - 1.00 mg/dL Final   Calcium 82/95/6213 9.1  8.9 - 10.3 mg/dL Final   Total Protein 08/65/7846 6.6  6.5 - 8.1 g/dL Final   Albumin 96/29/5284 3.5  3.5 - 5.0 g/dL  Final   AST 13/24/4010 20  15 - 41 U/L Final   ALT 02/25/2022 12  0 - 44 U/L Final   Alkaline Phosphatase 02/25/2022 53  38 - 126 U/L Final   Total Bilirubin 02/25/2022 0.5  0.3 - 1.2 mg/dL Final   GFR, Estimated 02/25/2022 >60  >60 mL/min Final   Comment: (NOTE) Calculated using the CKD-EPI Creatinine Equation (2021)    Anion gap 02/25/2022 10  5 - 15 Final   Performed at [facility], [address] [place], [place] [address]   WBC 02/25/2022 6.8  4.0 - 10.5 K/uL Final   RBC 02/25/2022 3.73 (L)  3.87 - 5.11 MIL/uL Final   Hemoglobin 02/25/2022 11.6 (L)  12.0 - 15.0 g/dL Final   HCT 66/44/0347 34.8 (L)  36.0 - 46.0 % Final   MCV 02/25/2022 93.3  80.0 - 100.0 fL Final   MCH 02/25/2022 31.1  26.0 - 34.0 pg Final   MCHC 02/25/2022 33.3  30.0 - 36.0 g/dL Final   RDW 42/59/5638 14.7  11.5 - 15.5 % Final   Platelets 02/25/2022 220  150 - 400 K/uL Final   nRBC 02/25/2022 0.0  0.0 - 0.2 % Final   Performed at [facility], [address], [place], [place] [address]   Sodium 02/26/2022 138  135 - 145 mmol/L Final   Potassium 02/26/2022 3.8  3.5 - 5.1 mmol/L Final   Chloride 02/26/2022 101  98 - 111 mmol/L Final   CO2 02/26/2022 29  22 - 32 mmol/L Final   Glucose, Bld 02/26/2022 122 (  H)  70 - 99 mg/dL Final   Glucose reference range applies only to samples taken after fasting for at least 8 hours.   BUN 02/26/2022 22 (H)  6 - 20 mg/dL Final   Creatinine, Ser 02/26/2022 0.68  0.44 - 1.00 mg/dL Final   Calcium 78/46/9629 9.1  8.9 - 10.3 mg/dL Final   GFR, Estimated 02/26/2022 >60  >60 mL/min Final   Comment: (NOTE) Calculated using the CKD-EPI Creatinine Equation (2021)    Anion gap 02/26/2022 8  5 - 15 Final   Performed at [facility], [address] [place], [place] [address]   WBC 02/26/2022 7.8  4.0 - 10.5 K/uL Final   RBC 02/26/2022 3.91  3.87 - 5.11 MIL/uL Final   Hemoglobin 02/26/2022 12.4  12.0 - 15.0 g/dL Final   HCT 32/44/0102 36.0  36.0 - 46.0 % Final    MCV 02/26/2022 92.1  80.0 - 100.0 fL Final   MCH 02/26/2022 31.7  26.0 - 34.0 pg Final   MCHC 02/26/2022 34.4  30.0 - 36.0 g/dL Final   RDW 72/53/6644 15.1  11.5 - 15.5 % Final   Platelets 02/26/2022 216  150 - 400 K/uL Final   nRBC 02/26/2022 0.0  0.0 - 0.2 % Final   Performed at [facility], [address] [place], [place] [address]   Sodium 02/27/2022 138  135 - 145 mmol/L Final   Potassium 02/27/2022 3.7  3.5 - 5.1 mmol/L Final   Chloride 02/27/2022 104  98 - 111 mmol/L Final   CO2 02/27/2022 23  22 - 32 mmol/L Final   Glucose, Bld 02/27/2022 120 (H)  70 - 99 mg/dL Final   Glucose reference range applies only to samples taken after fasting for at least 8 hours.   BUN 02/27/2022 20  6 - 20 mg/dL Final   Creatinine, Ser 02/27/2022 0.66  0.44 - 1.00 mg/dL Final   Calcium 25/95/6387 9.0  8.9 - 10.3 mg/dL Final   GFR, Estimated 02/27/2022 >60  >60 mL/min Final   Comment: (NOTE) Calculated using the CKD-EPI Creatinine Equation (2021)    Anion gap 02/27/2022 11  5 - 15 Final   Performed at [facility], [address] [place], [place] [address]   WBC 02/27/2022 8.2  4.0 - 10.5 K/uL Final   RBC 02/27/2022 3.79 (L)  3.87 - 5.11 MIL/uL Final   Hemoglobin 02/27/2022 12.1  12.0 - 15.0 g/dL Final   HCT 29/51/8841 35.0 (L)  36.0 - 46.0 % Final   MCV 02/27/2022 92.3  80.0 - 100.0 fL Final   MCH 02/27/2022 31.9  26.0 - 34.0 pg Final   MCHC 02/27/2022 34.6  30.0 - 36.0 g/dL Final   RDW 66/06/3014 15.5  11.5 - 15.5 % Final   Platelets 02/27/2022 205  150 - 400 K/uL Final   nRBC 02/27/2022 0.0  0.0 - 0.2 % Final   Performed at [facility], [address] [place], [place] [address]   Sodium 02/28/2022 139  135 - 145 mmol/L Final   Potassium 02/28/2022 3.8  3.5 - 5.1 mmol/L Final   Chloride 02/28/2022 104  98 - 111 mmol/L Final   CO2 02/28/2022 24  22 - 32 mmol/L Final   Glucose, Bld 02/28/2022 123 (H)  70 - 99 mg/dL Final   Glucose reference range applies only to samples  taken after fasting for at least 8 hours.   BUN 02/28/2022 24 (H)  6 -  20 mg/dL Final   Creatinine, Ser 02/28/2022 0.76  0.44 - 1.00 mg/dL Final   Calcium 25/42/7062 8.8 (L)  8.9 - 10.3 mg/dL Final   GFR, Estimated 02/28/2022 >60  >60 mL/min Final   Comment: (NOTE) Calculated using the CKD-EPI Creatinine Equation (2021)    Anion gap 02/28/2022 11  5 - 15 Final   Performed at [facility], [address], [place], [place] [address]   WBC 02/28/2022 9.6  4.0 - 10.5 K/uL Final   RBC 02/28/2022 3.62 (L)  3.87 - 5.11 MIL/uL Final   Hemoglobin 02/28/2022 11.3 (L)  12.0 - 15.0 g/dL Final   HCT 31/51/7616 34.5 (L)  36.0 - 46.0 % Final   MCV 02/28/2022 95.3  80.0 - 100.0 fL Final   MCH 02/28/2022 31.2  26.0 - 34.0 pg Final   MCHC 02/28/2022 32.8  30.0 - 36.0 g/dL Final   RDW 07/37/1062 15.8 (H)  11.5 - 15.5 % Final   Platelets 02/28/2022 205  150 - 400 K/uL Final   nRBC 02/28/2022 0.0  0.0 - 0.2 % Final   Performed at [facility], [address], [place], [place] [address]    Allergies: Patient has no known allergies.  Medications:  Facility Ordered Medications  Medication   [COMPLETED] ipratropium-albuterol (DUONEB) 0.5-2.5 (3) MG/3ML nebulizer solution 3 mL   [COMPLETED] methylPREDNISolone sodium succinate (SOLU-MEDROL) 125 mg/2 mL injection 125 mg   [COMPLETED] iohexol (OMNIPAQUE) 350 MG/ML injection 75 mL   acetaminophen (TYLENOL) tablet 650 mg   alum & mag hydroxide-simeth (MAALOX/MYLANTA) 200-200-20 MG/5ML suspension 30 mL   magnesium hydroxide (MILK OF MAGNESIA) suspension 30 mL   [COMPLETED] thiamine (VITAMIN B1) injection 100 mg   thiamine (VITAMIN B1) tablet 100 mg   multivitamin with minerals tablet 1 tablet   LORazepam (ATIVAN) tablet 1 mg   hydrOXYzine (ATARAX) tablet 25 mg   loperamide (IMODIUM) capsule 2-4 mg   ondansetron (ZOFRAN-ODT) disintegrating tablet 4 mg   LORazepam (ATIVAN) tablet 1 mg   Followed by   LORazepam (ATIVAN) tablet 1 mg   Followed by    Melene Muller ON 08/25/2022] LORazepam (ATIVAN) tablet 1 mg   Followed by   Melene Muller ON 08/27/2022] LORazepam (ATIVAN) tablet 1 mg   OLANZapine zydis (ZYPREXA) disintegrating tablet 10 mg   And   LORazepam (ATIVAN) tablet 1 mg   And   ziprasidone (GEODON) injection 20 mg   QUEtiapine (SEROQUEL) tablet 100 mg   predniSONE (DELTASONE) tablet 40 mg   montelukast (SINGULAIR) tablet 10 mg   mometasone-formoterol (DULERA) 100-5 MCG/ACT inhaler 2 puff   gabapentin (NEURONTIN) capsule 300 mg   [START ON 09/06/2022] mometasone-formoterol (DULERA) 100-5 MCG/ACT inhaler 2 puff   azithromycin (ZITHROMAX) tablet 250 mg   amLODipine (NORVASC) tablet 10 mg   albuterol (PROVENTIL) (2.5 MG/3ML) 0.083% nebulizer solution 2.5 mg   PTA Medications  Medication Sig   montelukast (SINGULAIR) 10 MG tablet Take 1 tablet (10 mg total) by mouth at bedtime.   mometasone-formoterol (DULERA) 100-5 MCG/ACT AERO Inhale 2 puffs into the lungs 2 (two) times daily.   [START ON 09/06/2022] budesonide-formoterol (SYMBICORT) 80-4.5 MCG/ACT inhaler Inhale 2 puffs into the lungs 2 (two) times daily.   amLODipine (NORVASC) 10 MG tablet Take 1 tablet (10 mg total) by mouth daily.   albuterol (PROVENTIL) (2.5 MG/3ML) 0.083% nebulizer solution Inhale 3 mLs (2.5 mg total) into the lungs every 4 (four) hours.   gabapentin (NEURONTIN) 300 MG capsule Take 1 capsule (300 mg  total) by mouth 3 (three) times daily.   QUEtiapine (SEROQUEL) 100 MG tablet Take 1 tablet (100 mg total) by mouth at bedtime.    Screenings    Flowsheet Row Most Recent Value  CIWA-Ar Total 7       Medical Decision Making  Inpatient obsevation  Lab Orders         Ethanol         POC urine preg, ED         POCT Urine Drug Screen - (I-Screen)       Meds ordered this encounter  Medications   acetaminophen (TYLENOL) tablet 650 mg   alum & mag hydroxide-simeth (MAALOX/MYLANTA) 200-200-20 MG/5ML suspension 30 mL   magnesium hydroxide (MILK OF MAGNESIA) suspension  30 mL   thiamine (VITAMIN B1) injection 100 mg   thiamine (VITAMIN B1) tablet 100 mg   multivitamin with minerals tablet 1 tablet   LORazepam (ATIVAN) tablet 1 mg   hydrOXYzine (ATARAX) tablet 25 mg   loperamide (IMODIUM) capsule 2-4 mg   ondansetron (ZOFRAN-ODT) disintegrating tablet 4 mg   FOLLOWED BY Linked Order Group    LORazepam (ATIVAN) tablet 1 mg    LORazepam (ATIVAN) tablet 1 mg    LORazepam (ATIVAN) tablet 1 mg    LORazepam (ATIVAN) tablet 1 mg   AND Linked Order Group    OLANZapine zydis (ZYPREXA) disintegrating tablet 10 mg    LORazepam (ATIVAN) tablet 1 mg    ziprasidone (GEODON) injection 20 mg   QUEtiapine (SEROQUEL) tablet 100 mg   predniSONE (DELTASONE) tablet 40 mg   montelukast (SINGULAIR) tablet 10 mg   mometasone-formoterol (DULERA) 100-5 MCG/ACT inhaler 2 puff   gabapentin (NEURONTIN) capsule 300 mg   mometasone-formoterol (DULERA) 100-5 MCG/ACT inhaler 2 puff   azithromycin (ZITHROMAX) tablet 250 mg   amLODipine (NORVASC) tablet 10 mg   albuterol (PROVENTIL) (2.5 MG/3ML) 0.083% nebulizer solution 2.5 mg     Recommendations  Based on my evaluation the patient does not appear to have an emergency medical condition.  Sindy Guadeloupe, NP 08/24/22  5:38 AM

## 2022-08-23 NOTE — ED Notes (Signed)
Pt ambulated on 5liters and maintained sats at 100 but complained of [facility]

## 2022-08-23 NOTE — ED Triage Notes (Signed)
Pt c/o dry cough and SOB. Pt wears 5L 02 per [place] at baseline. Pt has audible wheezes. Pt is  tachypneic

## 2022-08-23 NOTE — ED Provider Notes (Signed)
[place] EMERGENCY DEPARTMENT AT [facility] Provider Note  MDM   HPI/ROS:  Rachel Nolan is a 52 y.o. female with history of COPD, asthma, HTN presenting with chief complaint of shortness of breath.  Patient began to feel and dyspneic several hours ago and symptoms came on suddenly.  She is on 5 L of oxygen at home and has not deviated from this requirement.  She endorses an ongoing dry cough that is typical for her.  Denies increased production of cough, fevers, nasal congestion.  Physical exam is notable for: - Well-appearing, no acute distress - Cardiopulmonary exam significant for bilateral expiratory wheezes with markedly prolonged expiratory phase - No lower extremity edema  On my initial evaluation, patient is:  -Vital signs stable. Patient afebrile, hemodynamically stable, and non-toxic appearing. -Additional history obtained from chart review  Given the patient's history and physical exam, differential diagnosis includes but is not limited to COPD exacerbation, flash pulmonary edema, pneumonia, pulmonary embolism, etc.  Interpretations, interventions, and the patient's course of care are documented below.    Low concern for pneumonia given that patient has not had any recent productive cough, fevers, etc.  Given history and physical exam as well as medical history per chart review, most likely represents COPD exacerbation.  Administered Solu-Medrol and 3 back-to-back DuoNeb's here in the emergency department.  Additional workup to include EKG, VBG, CBC, CMP, BNP, COVID flu, chest x-ray.  Initial EKG without ischemic changes, there was disturbances, conduction blocks.  Overall reassuring.  Initial gas with pH of 7.4 and pCO2 of 41, reassuring.  Troponin and BNP both within normal limits.  CBC without anemia or leukocytosis.  Metabolic panel with normal renal function and electrolytes.  COVID flu negative.  Given relatively sudden onset of dyspnea, remains concern  for pulmonary embolism.  CT PE scan ordered.  Care of this patient was assumed by oncoming team providers at time of signout at approximately 1600.  Plan at time of signout to follow-up CT PE scan, determine disposition.    Disposition:  I discussed the plan for discharge with the patient and/or their surrogate at bedside prior to discharge and they were in agreement with the plan and verbalized understanding of the return precautions provided. All questions answered to the best of my ability. Ultimately, the patient was discharged in stable condition with stable vital signs. I am reassured that they are capable of close follow up and good social support at home.   Clinical Impression:  1. COPD exacerbation (HCC)     Rx / DC Orders ED Discharge Orders          Ordered    predniSONE (DELTASONE) 10 MG tablet  Daily        08/23/22 1905    azithromycin (ZITHROMAX) 250 MG tablet  Daily        08/23/22 1905            The plan for this patient was discussed with Dr. Dalene Seltzer, who voiced agreement and who oversaw evaluation and treatment of this patient.   Clinical Complexity A medically appropriate history, review of systems, and physical exam was performed.  My independent interpretations of EKG, labs, and radiology are documented in the ED course above.   Click here for ABCD2, HEART and other calculatorsREFRESH Note before signing   Patient's presentation is most consistent with acute complicated illness / injury requiring diagnostic workup.  Medical Decision Making Amount and/or Complexity of Data Reviewed Labs: ordered. Radiology: ordered.  Risk  Prescription drug management.    HPI/ROS      See MDM section for pertinent HPI and ROS. A complete ROS was performed with pertinent positives/negatives noted above.   Past Medical History:  Diagnosis Date   Anxiety    Asthma    COPD (chronic obstructive pulmonary disease) (HCC)    Depression    Hypertension     Scoliosis     Past Surgical History:  Procedure Laterality Date   BACK SURGERY     ECTOPIC PREGNANCY SURGERY        Physical Exam   Vitals:   08/23/22 1715 08/23/22 1800 08/23/22 1830 08/23/22 1900  BP: 112/70 112/68 119/78 (!) 124/96  Pulse: (!) 58 (!) 56 64 84  Resp: 17 18 16 19   Temp:      TempSrc:      SpO2: 100% 100% 100% 100%  Weight:      Height:        Physical Exam Vitals and nursing note reviewed.  Constitutional:      General: She is not in acute distress.    Appearance: She is well-developed.  HENT:     Head: Normocephalic and atraumatic.  Eyes:     Conjunctiva/sclera: Conjunctivae normal.  Cardiovascular:     Rate and Rhythm: Normal rate and regular rhythm.     Heart sounds: No murmur heard. Pulmonary:     Effort: Pulmonary effort is normal. No respiratory distress.     Breath sounds: Examination of the right-upper field reveals wheezing. Examination of the left-upper field reveals wheezing. Examination of the right-middle field reveals wheezing. Examination of the left-middle field reveals wheezing. Examination of the right-lower field reveals wheezing. Examination of the left-lower field reveals wheezing. Wheezing present.  Abdominal:     Palpations: Abdomen is soft.     Tenderness: There is no abdominal tenderness.  Musculoskeletal:        General: No swelling.     Cervical back: Neck supple.  Skin:    General: Skin is warm and dry.     Capillary Refill: Capillary refill takes less than 2 seconds.  Neurological:     Mental Status: She is alert.  Psychiatric:        Mood and Affect: Mood normal.    Starleen Arms, MD Department of Emergency Medicine   Please note that this documentation was produced with the assistance of voice-to-text technology and may contain errors.    Dyanne Iha, MD 08/24/22 1317    Alvira Monday, MD 09/01/22 [phone]

## 2022-08-23 NOTE — ED Provider Notes (Signed)
  Physical Exam  BP (!) 152/106   Pulse (!) 56   Temp 97.9 F (36.6 C) (Oral)   Resp 16   Ht 5\' 6"  (1.676 m)   Wt 86.2 kg   LMP 04/19/2018   SpO2 99%   BMI 30.67 kg/m   Physical Exam  Procedures  Procedures  ED Course / MDM   Clinical Course as of 08/23/22 1539  Mon Aug 23, 2022  1533 Stable. 51yF w/ severe COPD w/ exacerbation. Also getting PE Scan. Nebs and solumedrol given.  - f/u CT PE - Reassess for dispo [CD]    Clinical Course User Index [CD] Rhys Martini, DO   Medical Decision Making Amount and/or Complexity of Data Reviewed Labs: ordered. Radiology: ordered.  Risk Prescription drug management.   ***

## 2022-08-23 NOTE — ED Notes (Signed)
Able to add on BNP and troponin.

## 2022-08-24 ENCOUNTER — Other Ambulatory Visit: Payer: Self-pay

## 2022-08-24 ENCOUNTER — Encounter (HOSPITAL_COMMUNITY): Payer: Self-pay | Admitting: Registered Nurse

## 2022-08-24 DIAGNOSIS — R45851 Suicidal ideations: Secondary | ICD-10-CM

## 2022-08-24 LAB — POCT PREGNANCY, URINE: Preg Test, Ur: NEGATIVE

## 2022-08-24 MED ORDER — CHLORDIAZEPOXIDE HCL 25 MG PO CAPS
25.0000 mg | ORAL_CAPSULE | Freq: Four times a day (QID) | ORAL | Status: DC | PRN
  Administered 2022-08-24: 25 mg via ORAL
  Filled 2022-08-24: qty 1

## 2022-08-24 NOTE — ED Provider Notes (Addendum)
FBC/OBS ASAP Discharge Summary  Date and Time: 08/24/2022 2:03 PM  Name: Rachel Nolan  MRN:  [id number]   Discharge Diagnoses:  Final diagnoses:  Homelessness  Malingering  Suicidal ideation  Cocaine abuse (HCC)  Marijuana abuse    HPI:  Rachel Nolan, 52 y/o female with a psychiatric history of anxiety, chronic cocaine use,  and alcohol abuse was admitted to  [facility]  continuous assessment unit after presenting voluntarily after being discharged for [facility] ED with complaints of increased depression and suicidal ideation.    [name] reassessed face to face by this provider, chart reviewed, and  consulted with Dr. Gretta Cool; and chart reviewed on 08/24/22.  On evaluation Rachel Nolan reports she is no longer having suicidal thoughts.  States she does have depression and needs to make changes.  States she is living with her sister that is doing heroin "and that is a temptation.  I do crack cocaine but I've done heroin before.  I been off heroin for 8 years and I don't want to do it again.  I need to get help."  Patient states she wants to go to a substance abuse rehab program.  Patient denies suicidal/self-harm/homicidal ideation, psychosis, and paranoia.  Patient states she slept well last night and is tolerating her medications without adverse reaction.   During evaluation Rachel Nolan is lying in bed with no noted distress.  She is alert/oriented x 4, calm, cooperative, attentive, and responses were relevant and appropriate to assessment questions.  She spoke in a clear tone at moderate volume, and normal pace, with good eye contact.   She denies suicidal/self-harm/homicidal ideation, psychosis, and paranoia.  Objectively there is no evidence of psychosis/mania or delusional thinking.  She conversed coherently, with goal directed thoughts, no distractibility, or pre-occupation.  Recommended facility base crisis unit and patient in agreement.     Total Time spent with patient: 30  minutes  Past Psychiatric History: Polysubstance abuse (cocaine, alcohol), substance induced mood disorder, depression, suicidal ideation  Past Medical History:  Past Medical History:  Diagnosis Date   Anxiety    Asthma    COPD (chronic obstructive pulmonary disease) (HCC)    Depression    Hypertension    Scoliosis     Family History:  Family History  Problem Relation Age of Onset   Hypertension Mother    Hypertension Father     Family Psychiatric History: Unaware Social History:  Social History   Tobacco Use   Smoking status: Every Day    Current packs/day: 0.50    Types: Cigarettes   Smokeless tobacco: Never  Vaping Use   Vaping status: Never Used  Substance Use Topics   Alcohol use: Yes    Comment: occ   Drug use: Yes    Types: Marijuana, Cocaine, "Crack" cocaine    Tobacco Cessation:  A prescription for an FDA-approved tobacco cessation medication was offered at discharge and the patient refused  Current Medications:  Current Facility-Administered Medications  Medication Dose Route Frequency Provider Last Rate Last Admin   acetaminophen (TYLENOL) tablet 650 mg  650 mg Oral Q6H PRN Sindy Guadeloupe, NP       albuterol (PROVENTIL) (2.5 MG/3ML) 0.083% nebulizer solution 2.5 mg  2.5 mg Inhalation Q4H Sindy Guadeloupe, NP       alum & mag hydroxide-simeth (MAALOX/MYLANTA) 200-200-20 MG/5ML suspension 30 mL  30 mL Oral Q4H PRN Sindy Guadeloupe, NP       amLODipine (NORVASC) tablet 10 mg  10 mg Oral Daily  Sindy Guadeloupe, NP   10 mg at 08/24/22 1035   azithromycin (ZITHROMAX) tablet 250 mg  250 mg Oral Daily Sindy Guadeloupe, NP   250 mg at 08/24/22 1024   gabapentin (NEURONTIN) capsule 300 mg  300 mg Oral TID Sindy Guadeloupe, NP   300 mg at 08/24/22 1025   hydrOXYzine (ATARAX) tablet 25 mg  25 mg Oral Q6H PRN Sindy Guadeloupe, NP       loperamide (IMODIUM) capsule 2-4 mg  2-4 mg Oral PRN Sindy Guadeloupe, NP       LORazepam (ATIVAN) tablet 1 mg  1 mg Oral Q6H PRN Sindy Guadeloupe, NP        LORazepam (ATIVAN) tablet 1 mg  1 mg Oral QID Sindy Guadeloupe, NP   1 mg at 08/24/22 1021   Followed by   LORazepam (ATIVAN) tablet 1 mg  1 mg Oral TID Sindy Guadeloupe, NP       Followed by   Melene Muller ON 08/25/2022] LORazepam (ATIVAN) tablet 1 mg  1 mg Oral BID Sindy Guadeloupe, NP       Followed by   Melene Muller ON 08/27/2022] LORazepam (ATIVAN) tablet 1 mg  1 mg Oral Daily Sindy Guadeloupe, NP       OLANZapine zydis (ZYPREXA) disintegrating tablet 10 mg  10 mg Oral Q8H PRN Sindy Guadeloupe, NP       And   LORazepam (ATIVAN) tablet 1 mg  1 mg Oral PRN Sindy Guadeloupe, NP       And   ziprasidone (GEODON) injection 20 mg  20 mg Intramuscular PRN Sindy Guadeloupe, NP       magnesium hydroxide (MILK OF MAGNESIA) suspension 30 mL  30 mL Oral Daily PRN Sindy Guadeloupe, NP       mometasone-formoterol (DULERA) 100-5 MCG/ACT inhaler 2 puff  2 puff Inhalation BID Sindy Guadeloupe, NP   2 puff at 08/24/22 1025   [START ON 09/06/2022] mometasone-formoterol (DULERA) 100-5 MCG/ACT inhaler 2 puff  2 puff Inhalation BID Sindy Guadeloupe, NP       montelukast (SINGULAIR) tablet 10 mg  10 mg Oral QHS Sindy Guadeloupe, NP   10 mg at 08/24/22 0109   multivitamin with minerals tablet 1 tablet  1 tablet Oral Daily Sindy Guadeloupe, NP   1 tablet at 08/24/22 1024   ondansetron (ZOFRAN-ODT) disintegrating tablet 4 mg  4 mg Oral Q6H PRN Sindy Guadeloupe, NP       predniSONE (DELTASONE) tablet 40 mg  40 mg Oral Daily Sindy Guadeloupe, NP   40 mg at 08/24/22 1024   QUEtiapine (SEROQUEL) tablet 100 mg  100 mg Oral QHS Sindy Guadeloupe, NP   100 mg at 08/24/22 0109   thiamine (VITAMIN B1) tablet 100 mg  100 mg Oral Daily Sindy Guadeloupe, NP   100 mg at 08/24/22 1025   Current Outpatient Medications  Medication Sig Dispense Refill   albuterol (PROVENTIL) (2.5 MG/3ML) 0.083% nebulizer solution Inhale 3 mLs (2.5 mg total) into the lungs every 4 (four) hours.     azithromycin (ZITHROMAX) 250 MG tablet Take 1 tablet (250 mg total) by mouth daily. Take first 2 tablets together,  then 1 every day until finished. 6 tablet 0   [START ON 09/06/2022] budesonide-formoterol (SYMBICORT) 80-4.5 MCG/ACT inhaler Inhale 2 puffs into the lungs 2 (two) times daily.     gabapentin (NEURONTIN) 300 MG capsule Take 1 capsule (300 mg total) by mouth 3 (three) times daily. 90 capsule 0   mometasone-formoterol (DULERA) 100-5 MCG/ACT AERO Inhale 2 puffs  into the lungs 2 (two) times daily.     montelukast (SINGULAIR) 10 MG tablet Take 1 tablet (10 mg total) by mouth at bedtime. 30 tablet 0   predniSONE (DELTASONE) 10 MG tablet Take 4 tablets (40 mg total) by mouth daily for 4 days. 16 tablet 0   QUEtiapine (SEROQUEL) 100 MG tablet Take 1 tablet (100 mg total) by mouth at bedtime. 30 tablet 0    PTA Medications:  Facility Ordered Medications  Medication   [COMPLETED] ipratropium-albuterol (DUONEB) 0.5-2.5 (3) MG/3ML nebulizer solution 3 mL   [COMPLETED] methylPREDNISolone sodium succinate (SOLU-MEDROL) 125 mg/2 mL injection 125 mg   [COMPLETED] iohexol (OMNIPAQUE) 350 MG/ML injection 75 mL   acetaminophen (TYLENOL) tablet 650 mg   alum & mag hydroxide-simeth (MAALOX/MYLANTA) 200-200-20 MG/5ML suspension 30 mL   magnesium hydroxide (MILK OF MAGNESIA) suspension 30 mL   [COMPLETED] thiamine (VITAMIN B1) injection 100 mg   thiamine (VITAMIN B1) tablet 100 mg   multivitamin with minerals tablet 1 tablet   LORazepam (ATIVAN) tablet 1 mg   hydrOXYzine (ATARAX) tablet 25 mg   loperamide (IMODIUM) capsule 2-4 mg   ondansetron (ZOFRAN-ODT) disintegrating tablet 4 mg   LORazepam (ATIVAN) tablet 1 mg   Followed by   LORazepam (ATIVAN) tablet 1 mg   Followed by   Melene Muller ON 08/25/2022] LORazepam (ATIVAN) tablet 1 mg   Followed by   Melene Muller ON 08/27/2022] LORazepam (ATIVAN) tablet 1 mg   OLANZapine zydis (ZYPREXA) disintegrating tablet 10 mg   And   LORazepam (ATIVAN) tablet 1 mg   And   ziprasidone (GEODON) injection 20 mg   QUEtiapine (SEROQUEL) tablet 100 mg   predniSONE (DELTASONE) tablet 40  mg   montelukast (SINGULAIR) tablet 10 mg   mometasone-formoterol (DULERA) 100-5 MCG/ACT inhaler 2 puff   gabapentin (NEURONTIN) capsule 300 mg   [START ON 09/06/2022] mometasone-formoterol (DULERA) 100-5 MCG/ACT inhaler 2 puff   azithromycin (ZITHROMAX) tablet 250 mg   amLODipine (NORVASC) tablet 10 mg   albuterol (PROVENTIL) (2.5 MG/3ML) 0.083% nebulizer solution 2.5 mg   PTA Medications  Medication Sig   montelukast (SINGULAIR) 10 MG tablet Take 1 tablet (10 mg total) by mouth at bedtime.   mometasone-formoterol (DULERA) 100-5 MCG/ACT AERO Inhale 2 puffs into the lungs 2 (two) times daily.   [START ON 09/06/2022] budesonide-formoterol (SYMBICORT) 80-4.5 MCG/ACT inhaler Inhale 2 puffs into the lungs 2 (two) times daily.   albuterol (PROVENTIL) (2.5 MG/3ML) 0.083% nebulizer solution Inhale 3 mLs (2.5 mg total) into the lungs every 4 (four) hours.   gabapentin (NEURONTIN) 300 MG capsule Take 1 capsule (300 mg total) by mouth 3 (three) times daily.   QUEtiapine (SEROQUEL) 100 MG tablet Take 1 tablet (100 mg total) by mouth at bedtime.       12/21/2021    4:35 PM 02/28/2020    1:54 PM 09/11/2019    9:10 AM  Depression screen PHQ 2/9  Decreased Interest 0 2 0  Down, Depressed, Hopeless 1 2 2   PHQ - 2 Score 1 4 2   Altered sleeping 0 2 2  Tired, decreased energy 1 2 2   Change in appetite 0 1 2  Feeling bad or failure about yourself  2 1 1   Trouble concentrating 0 1 0  Moving slowly or fidgety/restless 0 1 0  Suicidal thoughts 0 1 2  PHQ-9 Score 4 13 11   Difficult doing work/chores  Very difficult     Flowsheet Row ED from 08/23/2022 in [facility] Most recent  reading at 08/24/2022  1:41 AM ED from 08/23/2022 in [facility] Emergency Department at [facility] Most recent reading at 08/23/2022 12:04 PM ED to Hosp-Admission (Discharged) from 08/04/2022 in [facility] Most recent reading at 08/04/2022  9:40 PM  C-SSRS RISK  CATEGORY No Risk No Risk No Risk       Musculoskeletal  Strength & Muscle Tone: within normal limits Gait & Station: normal Patient leans: N/A  Psychiatric Specialty Exam  Presentation  General Appearance:  Appropriate for Environment  Eye Contact: Good  Speech: Clear and Coherent; Normal Rate  Speech Volume: Normal  Handedness: Right   Mood and Affect  Mood: Anxious; Dysphoric  Affect: Congruent; Depressed   Thought Process  Thought Processes: Coherent; Goal Directed  Descriptions of Associations:Intact  Orientation:Full (Time, Place and Person)  Thought Content:Logical  Diagnosis of Schizophrenia or Schizoaffective disorder in past: No data recorded   Hallucinations:Hallucinations: None  Ideas of Reference:None  Suicidal Thoughts:Suicidal Thoughts: No  Homicidal Thoughts:Homicidal Thoughts: No   Sensorium  Memory: Immediate Fair; Recent Fair  Judgment: Intact  Insight: Present   Executive Functions  Concentration: Good  Attention Span: Good  Recall: Good  Fund of Knowledge: Good  Language: Good   Psychomotor Activity  Psychomotor Activity: Psychomotor Activity: Normal   Assets  Assets: Communication Skills; Desire for Improvement; Housing; Resilience   Sleep  Sleep: Sleep: Fair Number of Hours of Sleep: 6   Nutritional Assessment (For OBS and FBC admissions only) Has the patient had a weight loss or gain of 10 pounds or more in the last 3 months?: No Has the patient had a decrease in food intake/or appetite?: No Does the patient have dental problems?: No Does the patient have eating habits or behaviors that may be indicators of an eating disorder including binging or inducing vomiting?: No Has the patient recently lost weight without trying?: 0 Has the patient been eating poorly because of a decreased appetite?: 0 Malnutrition Screening Tool Score: 0    Physical Exam  Physical Exam Vitals and nursing  note reviewed.  Constitutional:      General: She is not in acute distress.    Appearance: Normal appearance. She is not ill-appearing.  HENT:     Head: Normocephalic.  Eyes:     Conjunctiva/sclera: Conjunctivae normal.  Cardiovascular:     Rate and Rhythm: Normal rate.  Pulmonary:     Effort: Pulmonary effort is normal. No respiratory distress.  Musculoskeletal:        General: Normal range of motion.     Cervical back: Normal range of motion.  Skin:    General: Skin is warm and dry.  Neurological:     Mental Status: She is alert and oriented to person, place, and time.  Psychiatric:        Attention and Perception: Attention and perception normal. She does not perceive auditory or visual hallucinations.        Mood and Affect: Affect normal. Mood is anxious and depressed.        Speech: Speech normal.        Behavior: Behavior normal. Behavior is cooperative.        Thought Content: Thought content normal. Thought content is not paranoid or delusional. Thought content does not include homicidal or suicidal ideation.        Judgment: Judgment is impulsive.    Review of Systems  Constitutional:        No complaints voiced  Respiratory:  Negative for cough, shortness of breath and wheezing.   Cardiovascular:  Negative for chest pain.  Psychiatric/Behavioral:  Positive for substance abuse (Cocaine.  History of heroin use but none in 8 yrs). Depression: Stable. Hallucinations: Denies. Suicidal ideas: Denies.The patient is nervous/anxious.   All other systems reviewed and are negative.  Blood pressure (!) 143/98, pulse 91, temperature 98 F (36.7 C), temperature source Oral, resp. rate 18, last menstrual period 04/19/2018, SpO2 95%. There is no height or weight on file to calculate BMI.  Disposition: Transfer to facility base crisis unit   , NP 08/24/2022, 2:03 PM

## 2022-08-24 NOTE — ED Notes (Signed)
Pt A&O x 4, presents with depression, hopelessness and Paranoia for past 2 weeks.  Pt states I think someone is after me.  Denies SI, HI or AVH.  Pt calm & cooperative, pleasant.  Pt uses oxygen during the day.  Resting at present, monitoring for safety.

## 2022-08-24 NOTE — ED Notes (Signed)
Pt is currently sleeping, no distress noted, environmental check complete, will continue to monitor patient for safety.  

## 2022-08-24 NOTE — ED Notes (Signed)
Pt sleeping at present, no distress noted.  Monitoring for safety. 

## 2022-08-24 NOTE — Care Management (Addendum)
Writer faxed referral to [facility], [facility], [facility], and [place].

## 2022-08-24 NOTE — ED Notes (Signed)
Patient is currently in her recliner with eyes closed.  Respirations are even and unlabored.

## 2022-08-25 ENCOUNTER — Other Ambulatory Visit (HOSPITAL_COMMUNITY)
Admission: EM | Admit: 2022-08-25 | Discharge: 2022-08-31 | Disposition: A | Discharge status: Other Acute Inpatient Hospital | Payer: MEDICAID | Attending: Psychiatry | Admitting: Psychiatry

## 2022-08-25 DIAGNOSIS — F17210 Nicotine dependence, cigarettes, uncomplicated: Secondary | ICD-10-CM | POA: Insufficient documentation

## 2022-08-25 DIAGNOSIS — F4310 Post-traumatic stress disorder, unspecified: Secondary | ICD-10-CM | POA: Insufficient documentation

## 2022-08-25 DIAGNOSIS — F1523 Other stimulant dependence with withdrawal: Secondary | ICD-10-CM | POA: Insufficient documentation

## 2022-08-25 DIAGNOSIS — Z79899 Other long term (current) drug therapy: Secondary | ICD-10-CM | POA: Insufficient documentation

## 2022-08-25 DIAGNOSIS — F1994 Other psychoactive substance use, unspecified with psychoactive substance-induced mood disorder: Secondary | ICD-10-CM | POA: Diagnosis present

## 2022-08-25 DIAGNOSIS — Z5900 Homelessness unspecified: Secondary | ICD-10-CM | POA: Insufficient documentation

## 2022-08-25 DIAGNOSIS — F32A Depression, unspecified: Secondary | ICD-10-CM | POA: Insufficient documentation

## 2022-08-25 DIAGNOSIS — F1593 Other stimulant use, unspecified with withdrawal: Secondary | ICD-10-CM

## 2022-08-25 DIAGNOSIS — F1490 Cocaine use, unspecified, uncomplicated: Secondary | ICD-10-CM | POA: Insufficient documentation

## 2022-08-25 DIAGNOSIS — I10 Essential (primary) hypertension: Secondary | ICD-10-CM | POA: Diagnosis not present

## 2022-08-25 DIAGNOSIS — J449 Chronic obstructive pulmonary disease, unspecified: Secondary | ICD-10-CM | POA: Insufficient documentation

## 2022-08-25 MED ORDER — OLANZAPINE 10 MG PO TBDP: 10.0000 mg | ORAL_TABLET | Freq: Three times a day (TID) | ORAL | Status: DC | PRN

## 2022-08-25 MED ORDER — AZITHROMYCIN 250 MG PO TABS
250.0000 mg | ORAL_TABLET | Freq: Every day | ORAL | Status: DC
  Administered 2022-08-26 – 2022-08-30 (×5): 250 mg via ORAL
  Filled 2022-08-25 (×5): qty 1

## 2022-08-25 MED ORDER — LOPERAMIDE HCL 2 MG PO CAPS: 2.0000 mg | ORAL_CAPSULE | ORAL | Status: DC | PRN

## 2022-08-25 MED ORDER — PREDNISONE 20 MG PO TABS
40.0000 mg | ORAL_TABLET | Freq: Every day | ORAL | Status: DC
  Administered 2022-08-26 – 2022-08-30 (×5): 40 mg via ORAL
  Filled 2022-08-25 (×5): qty 2

## 2022-08-25 MED ORDER — MOMETASONE FURO-FORMOTEROL FUM 100-5 MCG/ACT IN AERO
2.0000 | INHALATION_SPRAY | Freq: Two times a day (BID) | RESPIRATORY_TRACT | Status: DC
Start: 2022-09-06 — End: 2022-08-25

## 2022-08-25 MED ORDER — MOMETASONE FURO-FORMOTEROL FUM 100-5 MCG/ACT IN AERO
2.0000 | INHALATION_SPRAY | Freq: Two times a day (BID) | RESPIRATORY_TRACT | Status: DC
  Administered 2022-08-25 – 2022-08-30 (×10): 2 via RESPIRATORY_TRACT

## 2022-08-25 MED ORDER — MAGNESIUM HYDROXIDE 400 MG/5ML PO SUSP: 30.0000 mL | Freq: Every day | ORAL | Status: DC | PRN

## 2022-08-25 MED ORDER — QUETIAPINE FUMARATE 100 MG PO TABS
100.0000 mg | ORAL_TABLET | Freq: Every day | ORAL | Status: DC
  Administered 2022-08-25 – 2022-08-30 (×6): 100 mg via ORAL
  Filled 2022-08-25 (×6): qty 1

## 2022-08-25 MED ORDER — AMLODIPINE BESYLATE 10 MG PO TABS
10.0000 mg | ORAL_TABLET | Freq: Every day | ORAL | Status: DC
  Administered 2022-08-26 – 2022-08-30 (×5): 10 mg via ORAL
  Filled 2022-08-25 (×5): qty 1

## 2022-08-25 MED ORDER — HYDROXYZINE HCL 25 MG PO TABS: 25.0000 mg | ORAL_TABLET | Freq: Four times a day (QID) | ORAL | Status: DC | PRN

## 2022-08-25 MED ORDER — ADULT MULTIVITAMIN W/MINERALS CH
1.0000 | ORAL_TABLET | Freq: Every day | ORAL | Status: DC
  Administered 2022-08-26 – 2022-08-30 (×5): 1 via ORAL
  Filled 2022-08-25 (×5): qty 1

## 2022-08-25 MED ORDER — ZIPRASIDONE MESYLATE 20 MG IM SOLR: 20.0000 mg | INTRAMUSCULAR | Status: DC | PRN

## 2022-08-25 MED ORDER — CHLORDIAZEPOXIDE HCL 25 MG PO CAPS: 25.0000 mg | ORAL_CAPSULE | Freq: Four times a day (QID) | ORAL | Status: DC | PRN

## 2022-08-25 MED ORDER — ACETAMINOPHEN 325 MG PO TABS: 650.0000 mg | ORAL_TABLET | Freq: Four times a day (QID) | ORAL | Status: DC | PRN

## 2022-08-25 MED ORDER — LORAZEPAM 1 MG PO TABS: 1.0000 mg | ORAL_TABLET | ORAL | Status: DC | PRN

## 2022-08-25 MED ORDER — THIAMINE MONONITRATE 100 MG PO TABS
100.0000 mg | ORAL_TABLET | Freq: Every day | ORAL | Status: DC
  Administered 2022-08-26 – 2022-08-30 (×5): 100 mg via ORAL
  Filled 2022-08-25 (×5): qty 1

## 2022-08-25 MED ORDER — ALUM & MAG HYDROXIDE-SIMETH 200-200-20 MG/5ML PO SUSP: 30.0000 mL | ORAL | Status: DC | PRN

## 2022-08-25 MED ORDER — ONDANSETRON 4 MG PO TBDP: 4.0000 mg | ORAL_TABLET | Freq: Four times a day (QID) | ORAL | Status: DC | PRN

## 2022-08-25 MED ORDER — MONTELUKAST SODIUM 10 MG PO TABS
10.0000 mg | ORAL_TABLET | Freq: Every day | ORAL | Status: DC
  Administered 2022-08-25 – 2022-08-30 (×5): 10 mg via ORAL
  Filled 2022-08-25 (×5): qty 1

## 2022-08-25 MED ORDER — GABAPENTIN 300 MG PO CAPS
300.0000 mg | ORAL_CAPSULE | Freq: Three times a day (TID) | ORAL | Status: DC
  Administered 2022-08-25 – 2022-08-28 (×8): 300 mg via ORAL
  Filled 2022-08-25 (×8): qty 1

## 2022-08-25 MED ORDER — ALBUTEROL SULFATE (2.5 MG/3ML) 0.083% IN NEBU
2.5000 mg | INHALATION_SOLUTION | RESPIRATORY_TRACT | Status: DC
  Administered 2022-08-25 – 2022-08-31 (×19): 2.5 mg via RESPIRATORY_TRACT
  Filled 2022-08-25 (×17): qty 3

## 2022-08-25 MED ORDER — AMLODIPINE BESYLATE 10 MG PO TABS: 10.0000 mg | ORAL_TABLET | Freq: Every day | ORAL | 0 refills | Status: AC

## 2022-08-25 NOTE — ED Notes (Signed)
Pt. resting in bed at the current c eyes closed. No s/s of acute distress, pain or any discomfort at this time. VSS. Safety maintained. Will continue to monitor and report any COC.

## 2022-08-25 NOTE — Care Management (Addendum)
Care Management   [facility] returned the call and requested to speak to the phone for an intake interview.   Writer met with the patient and informed her that she needed to call [facility] back and complete an intake interview.   Writer requested to assist the patient in returning the phone call to [facility] to complete the intake interview.   4:14pm  Writer confirmed with [facility] that the patient completed the pre-screen phone intake interview.  Per the intake worker acceptance to the program is pending approval from the nursing staff.  Writer was advised to call back tomorrow 08-26-2022 after 9am.   Writer informed the NP Shuvon and the RN working with the patient .

## 2022-08-25 NOTE — ED Provider Notes (Signed)
   Rachel Nolan was accepted to [facility] yesterday 08/24/22 but was not transferred.   Spoke with nursing and patient is ready to be transferred at this time

## 2022-08-25 NOTE — ED Notes (Signed)
Pt d/c to [facility] with AVS. Pt stable, alert, oriented, and ambulatory. All medications administered per MD order. Safety maintained.

## 2022-08-25 NOTE — Group Note (Signed)
Group Topic: Recovery Basics  Group Date: 08/25/2022 Start Time: 2000 End Time: 2100 Facilitators: Caroline More, LPN  Department: [facility]  Number of Participants: 5  Group Focus: abuse issues, acceptance, chemical dependency education, chemical dependency issues, clarity of thought, personal responsibility, and substance abuse education Treatment Modality:  Psychoeducation Interventions utilized were group exercise and story telling Purpose: enhance coping skills, explore maladaptive thinking, express feelings, express irrational fears, improve communication skills, increase insight, regain self-worth, reinforce self-care, relapse prevention strategies, and trigger / craving management  Name: Rachel Nolan Date of Birth: [birth year]  MR: [id number]    Level of Participation: Appropriate Quality of Participation: isolative Interactions with others: Minimal Mood/Affect: flat Triggers (if applicable): N/A Cognition: confused Progress: Minimal Response: Minimal Plan: follow-up needed  Patients Problems:  Patient Active Problem List   Diagnosis Date Noted   COPD exacerbation (HCC) 02/24/2022   Encounter for screening involving social determinants of health (SDoH) 12/21/2021   Endometrial thickening on ultrasound 12/15/2021   Hypokalemia 12/12/2021   Hyperglycemia 12/12/2021   COPD with acute exacerbation (HCC) 09/20/2021   Polysubstance abuse (HCC)    OSA (obstructive sleep apnea) 05/17/2021   Chronic respiratory failure with hypoxia (HCC) - 2 L/min 04/20/2021   History of substance abuse (HCC) 04/20/2021   Nicotine dependence, cigarettes, uncomplicated 02/12/2021   Tobacco abuse 09/24/2020   Abnormal weight loss 09/24/2020   Asthma 09/23/2020   Cocaine abuse, continuous (HCC) 07/09/2020   Opioid use disorder, moderate, dependence (HCC) 05/16/2020   Adjustment disorder with depressed mood 04/24/2020   Cocaine-induced mood disorder (HCC)  04/22/2020   Essential hypertension 03/26/2020   Neuropathic pain 03/26/2020   Severe recurrent major depression with psychotic features (HCC) 03/25/2020   Major depressive disorder with psychotic features (HCC) 01/24/2020   Alcohol use disorder, severe, dependence (HCC) 12/06/2019   Cocaine use disorder (HCC) 03/05/2019   Post traumatic stress disorder (PTSD) 03/02/2019   Anxiety and depression 03/02/2019   Substance induced mood disorder (HCC) 03/02/2019   MDD (major depressive disorder) 03/01/2019

## 2022-08-25 NOTE — Progress Notes (Signed)
Patient transferred from [facility] to [facility] for SUD. A&Ox 4 with irritability, and anger. Admitted with use of walker and O2 for ambulation.  Breath-sounds with audible expiratory wheezing. Skin intact warm to touch with old surgical site to midline of back and several old scratched areas to body surface. Patient denies S/I, H/I, AVH, plan or intent to commit suicide.  Fifteen minute safety checks initiated. Staff to monitor for patient safety.

## 2022-08-25 NOTE — ED Notes (Signed)
Pt. up eating at the current, on side of bed. No s/s of acute distress. No c/o pain or any discomfort voiced at this time. VSS. Safety maintained. Will continue to monitor and report any COC.

## 2022-08-25 NOTE — Progress Notes (Signed)
O2 tank with [place] placed in pt's room. Pt educated on how to use it.

## 2022-08-25 NOTE — ED Notes (Signed)
Patient CIWA score 0.

## 2022-08-25 NOTE — ED Notes (Signed)
Pt in the bedroom sleeping. NAD. Respirations even and unlabored. Will continue to monitor for safety.

## 2022-08-25 NOTE — ED Notes (Signed)
Pt is currently sleeping, no distress noted, environmental check complete, will continue to monitor patient for safety.  

## 2022-08-25 NOTE — Progress Notes (Signed)
Per NP order, pt is to use Oxygen during wake hours if need during ambulation. Will continue to monitor until end of my shift.

## 2022-08-26 DIAGNOSIS — F1994 Other psychoactive substance use, unspecified with psychoactive substance-induced mood disorder: Secondary | ICD-10-CM | POA: Diagnosis not present

## 2022-08-26 DIAGNOSIS — Z79899 Other long term (current) drug therapy: Secondary | ICD-10-CM | POA: Diagnosis not present

## 2022-08-26 DIAGNOSIS — I10 Essential (primary) hypertension: Secondary | ICD-10-CM | POA: Diagnosis not present

## 2022-08-26 DIAGNOSIS — F17210 Nicotine dependence, cigarettes, uncomplicated: Secondary | ICD-10-CM | POA: Diagnosis not present

## 2022-08-26 LAB — LIPID PANEL
Cholesterol: 177 mg/dL (ref 0–200)
HDL: 90 mg/dL (ref >40)
LDL Cholesterol: 70 mg/dL (ref 0–99)
Total CHOL/HDL Ratio: 2 RATIO
Triglycerides: 83 mg/dL (ref <150)
VLDL: 17 mg/dL (ref 0–40)

## 2022-08-26 LAB — HEPATITIS PANEL, ACUTE: Hepatitis B Surface Ag: NONREACTIVE

## 2022-08-26 LAB — HEMOGLOBIN A1C
Hgb A1c MFr Bld: 5.5 % (ref 4.8–5.6)
Mean Plasma Glucose: 111.15 mg/dL

## 2022-08-26 LAB — TSH: TSH: 0.072 u[IU]/mL — ABNORMAL LOW (ref 0.350–4.500)

## 2022-08-26 MED ORDER — ONDANSETRON HCL 4 MG PO TABS: 8.0000 mg | ORAL_TABLET | Freq: Three times a day (TID) | ORAL | Status: DC | PRN

## 2022-08-26 MED ORDER — DIPHENHYDRAMINE HCL 25 MG PO CAPS: 25.0000 mg | ORAL_CAPSULE | Freq: Four times a day (QID) | ORAL | Status: DC | PRN

## 2022-08-26 MED ORDER — ACETAMINOPHEN 325 MG PO TABS
650.0000 mg | ORAL_TABLET | Freq: Four times a day (QID) | ORAL | Status: DC | PRN
  Administered 2022-08-26 – 2022-08-31 (×5): 650 mg via ORAL
  Filled 2022-08-26 (×5): qty 2

## 2022-08-26 MED ORDER — NICOTINE 14 MG/24HR TD PT24: 14.0000 mg | MEDICATED_PATCH | Freq: Every day | TRANSDERMAL | Status: DC | PRN

## 2022-08-26 MED ORDER — MELATONIN 3 MG PO TABS
3.0000 mg | ORAL_TABLET | Freq: Every evening | ORAL | Status: DC | PRN
  Administered 2022-08-28: 3 mg via ORAL
  Filled 2022-08-26: qty 1

## 2022-08-26 MED ORDER — CHLORDIAZEPOXIDE HCL 25 MG PO CAPS: 25.0000 mg | ORAL_CAPSULE | ORAL | Status: DC | PRN

## 2022-08-26 MED ORDER — ALUM & MAG HYDROXIDE-SIMETH 200-200-20 MG/5ML PO SUSP: 30.0000 mL | ORAL | Status: DC | PRN

## 2022-08-26 MED ORDER — HYDROXYZINE HCL 25 MG PO TABS: 25.0000 mg | ORAL_TABLET | Freq: Three times a day (TID) | ORAL | Status: DC | PRN

## 2022-08-26 MED ORDER — LORAZEPAM 2 MG/ML IJ SOLN: 1.0000 mg | Freq: Four times a day (QID) | INTRAMUSCULAR | Status: DC | PRN

## 2022-08-26 MED ORDER — SENNA 8.6 MG PO TABS: 1.0000 | ORAL_TABLET | Freq: Every evening | ORAL | Status: DC | PRN

## 2022-08-26 MED ORDER — HALOPERIDOL 5 MG PO TABS: 5.0000 mg | ORAL_TABLET | Freq: Four times a day (QID) | ORAL | Status: DC | PRN

## 2022-08-26 MED ORDER — HALOPERIDOL LACTATE 5 MG/ML IJ SOLN: 5.0000 mg | Freq: Four times a day (QID) | INTRAMUSCULAR | Status: DC | PRN

## 2022-08-26 MED ORDER — BISMUTH SUBSALICYLATE 262 MG PO CHEW: 524.0000 mg | CHEWABLE_TABLET | ORAL | Status: DC | PRN

## 2022-08-26 MED ORDER — POLYETHYLENE GLYCOL 3350 17 G PO PACK
17.0000 g | PACK | Freq: Every day | ORAL | Status: DC | PRN
  Administered 2022-08-26: 17 g via ORAL
  Filled 2022-08-26: qty 1

## 2022-08-26 MED ORDER — NICOTINE POLACRILEX 2 MG MT GUM: 2.0000 mg | CHEWING_GUM | OROMUCOSAL | Status: DC | PRN

## 2022-08-26 MED ORDER — LORAZEPAM 1 MG PO TABS: 1.0000 mg | ORAL_TABLET | Freq: Four times a day (QID) | ORAL | Status: DC | PRN

## 2022-08-26 MED ORDER — DIPHENHYDRAMINE HCL 50 MG/ML IJ SOLN: 25.0000 mg | Freq: Four times a day (QID) | INTRAMUSCULAR | Status: DC | PRN

## 2022-08-26 NOTE — ED Notes (Signed)
Patient observed resting quietly, eyes closed. Respirations equal and unlabored. Will continue to monitor for safety.  

## 2022-08-26 NOTE — ED Notes (Signed)
Patient complaint of constipation. I advised patient I will check her chart and see what medication she has on file for constipation

## 2022-08-26 NOTE — Group Note (Signed)
Group Topic: Relapse and Recovery  Group Date: 08/26/2022 Start Time: 2000 End Time: 2100 Facilitators: Rae Lips B  Department: [facility]  Number of Participants: 3  Group Focus: abuse issues, acceptance, and activities of daily living skills Treatment Modality:  Leisure Development Interventions utilized were leisure development Purpose: express feelings and trigger / craving management  Name: Rachel Nolan Date of Birth: 13-Aug-1970  MR: [id number]    Level of Participation: active Quality of Participation: attentive Interactions with others: gave feedback Mood/Affect: appropriate Triggers (if applicable): NA Cognition: coherent/clear Progress: Gaining insight Response: NA Plan: patient will be encouraged to keep going to groups.   Patients Problems:  Patient Active Problem List   Diagnosis Date Noted   COPD exacerbation (HCC) 02/24/2022   Encounter for screening involving social determinants of health (SDoH) 12/21/2021   Endometrial thickening on ultrasound 12/15/2021   Hypokalemia 12/12/2021   Hyperglycemia 12/12/2021   COPD with acute exacerbation (HCC) 09/20/2021   Polysubstance abuse (HCC)    OSA (obstructive sleep apnea) 05/17/2021   Chronic respiratory failure with hypoxia (HCC) - 2 L/min 04/20/2021   History of substance abuse (HCC) 04/20/2021   Nicotine dependence, cigarettes, uncomplicated 02/12/2021   Tobacco abuse 09/24/2020   Abnormal weight loss 09/24/2020   Asthma 09/23/2020   Cocaine abuse, continuous (HCC) 07/09/2020   Opioid use disorder, moderate, dependence (HCC) 05/16/2020   Adjustment disorder with depressed mood 04/24/2020   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   Essential hypertension 03/26/2020   Neuropathic pain 03/26/2020   Severe recurrent major depression with psychotic features (HCC) 03/25/2020   Major depressive disorder with psychotic features (HCC) 01/24/2020   Alcohol use disorder,  severe, dependence (HCC) 12/06/2019   Cocaine use disorder (HCC) 03/05/2019   Post traumatic stress disorder (PTSD) 03/02/2019   Anxiety and depression 03/02/2019   Substance induced mood disorder (HCC) 03/02/2019   MDD (major depressive disorder) 03/01/2019

## 2022-08-26 NOTE — Care Management (Signed)
Care Management   Per Atina at [facility] [phone]) the patient was declined until she no longer taking ativan.  Per Atina at [facility] the patient cannot use her oxygen machine or her C-PAP machine while at [facility].

## 2022-08-26 NOTE — ED Notes (Signed)
Pt in the bedroom sleeping. NAD. Respirations even and unlabored. Will continue to monitor for safety.

## 2022-08-26 NOTE — ED Notes (Addendum)
Patient awake requesting for something to drink. Slightly wheezing . Breathing rx given at this time.  Will continue to monitor for safety.

## 2022-08-26 NOTE — ED Notes (Signed)
Patient is calm and cooperative. She denies SI/HI/AVH. Patient reports sleep was "alright" last night. Denies any current pain. No additional needs at this time. Will continue to monitor for safety.

## 2022-08-26 NOTE — ED Notes (Signed)
Pt was provided breakfast.

## 2022-08-26 NOTE — ED Notes (Signed)
Patient is currently in the dining room attending AA

## 2022-08-26 NOTE — ED Notes (Signed)
Pt offered snack and juice.

## 2022-08-26 NOTE — ED Notes (Signed)
Pt was provided dinner.

## 2022-08-26 NOTE — ED Notes (Signed)
Patient observed/assessed in room in bed appearing in no immediate distress resting peacefully. Q15 minute checks continued by nursing staff. Will continue to monitor and support.

## 2022-08-26 NOTE — ED Notes (Signed)
Pt is currently sleeping, no distress noted, environmental check complete, will continue to monitor patient for safety.  

## 2022-08-26 NOTE — ED Notes (Signed)
Patient in the bedroom sleeping. NAD.  Respirations even and unlabored. Will continue to monitor for safety.

## 2022-08-26 NOTE — ED Notes (Signed)
Pt is sitting at nursing station receiving her breathing treatments.

## 2022-08-26 NOTE — ED Notes (Signed)
Pt was provided lunch

## 2022-08-26 NOTE — Group Note (Signed)
Group Topic: Understanding Self  Group Date: 08/26/2022 Start Time: 1030 End Time: 1115 Facilitators: Londell Moh, NT  Department: [facility]  Number of Participants: 6  Group Focus: acceptance and daily reflection Treatment Modality:  Psychoeducation Interventions utilized were patient education Purpose: increase insight  Name: Rachel Nolan Date of Birth: 08-Apr-1970  MR: [id number]    Level of Participation: Pt did not attend group.  Patients Problems:  Patient Active Problem List   Diagnosis Date Noted   COPD exacerbation (HCC) 02/24/2022   Encounter for screening involving social determinants of health (SDoH) 12/21/2021   Endometrial thickening on ultrasound 12/15/2021   Hypokalemia 12/12/2021   Hyperglycemia 12/12/2021   COPD with acute exacerbation (HCC) 09/20/2021   Polysubstance abuse (HCC)    OSA (obstructive sleep apnea) 05/17/2021   Chronic respiratory failure with hypoxia (HCC) - 2 L/min 04/20/2021   History of substance abuse (HCC) 04/20/2021   Nicotine dependence, cigarettes, uncomplicated 02/12/2021   Tobacco abuse 09/24/2020   Abnormal weight loss 09/24/2020   Asthma 09/23/2020   Cocaine abuse, continuous (HCC) 07/09/2020   Opioid use disorder, moderate, dependence (HCC) 05/16/2020   Adjustment disorder with depressed mood 04/24/2020   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   Essential hypertension 03/26/2020   Neuropathic pain 03/26/2020   Severe recurrent major depression with psychotic features (HCC) 03/25/2020   Major depressive disorder with psychotic features (HCC) 01/24/2020   Alcohol use disorder, severe, dependence (HCC) 12/06/2019   Cocaine use disorder (HCC) 03/05/2019   Post traumatic stress disorder (PTSD) 03/02/2019   Anxiety and depression 03/02/2019   Substance induced mood disorder (HCC) 03/02/2019   MDD (major depressive disorder) 03/01/2019
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
## 2022-08-26 NOTE — ED Notes (Signed)
Pt was up and asked for some water MHT got it and gave it to her.

## 2022-08-27 DIAGNOSIS — Z79899 Other long term (current) drug therapy: Secondary | ICD-10-CM | POA: Diagnosis not present

## 2022-08-27 DIAGNOSIS — F1994 Other psychoactive substance use, unspecified with psychoactive substance-induced mood disorder: Secondary | ICD-10-CM | POA: Diagnosis not present

## 2022-08-27 DIAGNOSIS — F17210 Nicotine dependence, cigarettes, uncomplicated: Secondary | ICD-10-CM | POA: Diagnosis not present

## 2022-08-27 DIAGNOSIS — I10 Essential (primary) hypertension: Secondary | ICD-10-CM | POA: Diagnosis not present

## 2022-08-27 NOTE — ED Notes (Signed)
Patient woke up while this nurse was doing a 15 minute check on her. This nurse noticed patient had taken off her oxygen. The patient stated she took it off because she was going to the bathroom. This nurse checked the patients oxygen level, the patient is at 97% on room air.

## 2022-08-27 NOTE — ED Notes (Signed)
Patient came to nurses station with complaint of tooth pain

## 2022-08-27 NOTE — Group Note (Signed)
Group Topic: Social Support  Group Date: 08/27/2022 Start Time: 1000 End Time: 1032 Facilitators: Vonzell Schlatter B  Department: [facility]  Number of Participants: 5  Group Focus: community group and daily focus Treatment Modality:  Psychoeducation Interventions utilized were support Purpose: peer support  Name: Rachel Nolan Date of Birth: 30-Jul-1970  MR: [id number]    Level of Participation: moderate Quality of Participation: attentive and cooperative Interactions with others: gave feedback Mood/Affect: positive Triggers (if applicable): n/a Cognition: coherent/clear Progress: Moderate Response: n/a Plan: follow-up needed  Patients Problems:  Patient Active Problem List   Diagnosis Date Noted   COPD exacerbation (HCC) 02/24/2022   Encounter for screening involving social determinants of health (SDoH) 12/21/2021   Endometrial thickening on ultrasound 12/15/2021   Hypokalemia 12/12/2021   Hyperglycemia 12/12/2021   COPD with acute exacerbation (HCC) 09/20/2021   Polysubstance abuse (HCC)    OSA (obstructive sleep apnea) 05/17/2021   Chronic respiratory failure with hypoxia (HCC) - 2 L/min 04/20/2021   History of substance abuse (HCC) 04/20/2021   Nicotine dependence, cigarettes, uncomplicated 02/12/2021   Tobacco abuse 09/24/2020   Abnormal weight loss 09/24/2020   Asthma 09/23/2020   Cocaine abuse, continuous (HCC) 07/09/2020   Opioid use disorder, moderate, dependence (HCC) 05/16/2020   Adjustment disorder with depressed mood 04/24/2020   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   Essential hypertension 03/26/2020   Neuropathic pain 03/26/2020   Severe recurrent major depression with psychotic features (HCC) 03/25/2020   Major depressive disorder with psychotic features (HCC) 01/24/2020   Alcohol use disorder, severe, dependence (HCC) 12/06/2019   Cocaine use disorder (HCC) 03/05/2019   Post traumatic stress disorder (PTSD)  03/02/2019   Anxiety and depression 03/02/2019   Substance induced mood disorder (HCC) 03/02/2019   MDD (major depressive disorder) 03/01/2019

## 2022-08-27 NOTE — ED Provider Notes (Signed)
Behavioral Health Progress Note  Date and Time: 08/27/2022 11:30 AM Name: Rachel Nolan MRN:  [id number]  Subjective: Patient feels she is doing well. She is not endorsing any cravings for or withdrawal symptoms from stimulants. She remains interested in going to rehab.  Patient does not endorse any side-effects they attribute to medications. Patient does not endorse any somatic complaints  Diagnosis:  Final diagnoses:  Stimulant withdrawal (HCC)    Total Time spent with patient: 45 minutes   Past Psychiatric History: MDD, PTSD  Past Medical History: COPD, essential HTN Family History: Father and sister - substance abuse  Social History:  Marital status: Single 3 children Homeless currently Uses crack cocaine, smokes cigarettes.    Sleep: Good  Appetite:  Good  Current Medications: Current Facility-Administered Medications  Medication Dose Route Frequency Provider Last Rate Last Admin   acetaminophen (TYLENOL) tablet 650 mg  650 mg Oral Q6H PRN Augusto Gamble, MD   650 mg at 08/27/22 0542   albuterol (PROVENTIL) (2.5 MG/3ML) 0.083% nebulizer solution 2.5 mg  2.5 mg Inhalation Q4H Rankin, Shuvon B, NP   2.5 mg at 08/27/22 0934   alum & mag hydroxide-simeth (MAALOX/MYLANTA) 200-200-20 MG/5ML suspension 30 mL  30 mL Oral Q4H PRN Augusto Gamble, MD       amLODipine (NORVASC) tablet 10 mg  10 mg Oral Daily Rankin, Shuvon B, NP   10 mg at 08/27/22 4098   azithromycin (ZITHROMAX) tablet 250 mg  250 mg Oral Daily Rankin, Shuvon B, NP   250 mg at 08/27/22 1191   bismuth subsalicylate (PEPTO BISMOL) chewable tablet 524 mg  524 mg Oral Q3H PRN Augusto Gamble, MD       haloperidol (HALDOL) tablet 5 mg  5 mg Oral Q6H PRN Augusto Gamble, MD       And   LORazepam (ATIVAN) tablet 1 mg  1 mg Oral Q6H PRN Augusto Gamble, MD       And   diphenhydrAMINE (BENADRYL) capsule 25 mg  25 mg Oral Q6H PRN Augusto Gamble, MD       haloperidol lactate (HALDOL) injection 5 mg  5 mg Intramuscular Q6H PRN Augusto Gamble, MD        And   LORazepam (ATIVAN) injection 1 mg  1 mg Intravenous Q6H PRN Augusto Gamble, MD       And   diphenhydrAMINE (BENADRYL) injection 25 mg  25 mg Intramuscular Q6H PRN Augusto Gamble, MD       gabapentin (NEURONTIN) capsule 300 mg  300 mg Oral TID Rankin, Shuvon B, NP   300 mg at 08/27/22 1001   hydrOXYzine (ATARAX) tablet 25 mg  25 mg Oral TID PRN Augusto Gamble, MD       melatonin tablet 3 mg  3 mg Oral QHS PRN Augusto Gamble, MD       mometasone-formoterol (DULERA) 100-5 MCG/ACT inhaler 2 puff  2 puff Inhalation BID Rankin, Shuvon B, NP   2 puff at 08/27/22 0927   montelukast (SINGULAIR) tablet 10 mg  10 mg Oral QHS Rankin, Shuvon B, NP   10 mg at 08/26/22 2108   multivitamin with minerals tablet 1 tablet  1 tablet Oral Daily Rankin, Shuvon B, NP   1 tablet at 08/27/22 4782   nicotine (NICODERM CQ - dosed in mg/24 hours) patch 14 mg  14 mg Transdermal Daily PRN Augusto Gamble, MD       nicotine polacrilex (NICORETTE) gum 2 mg  2 mg Oral PRN Augusto Gamble, MD  ondansetron (ZOFRAN) tablet 8 mg  8 mg Oral Q8H PRN Augusto Gamble, MD       polyethylene glycol (MIRALAX / GLYCOLAX) packet 17 g  17 g Oral Daily PRN Augusto Gamble, MD   17 g at 08/26/22 2107   predniSONE (DELTASONE) tablet 40 mg  40 mg Oral Daily Rankin, Shuvon B, NP   40 mg at 08/27/22 9562   QUEtiapine (SEROQUEL) tablet 100 mg  100 mg Oral QHS Rankin, Shuvon B, NP   100 mg at 08/26/22 2108   senna (SENOKOT) tablet 8.6 mg  1 tablet Oral QHS PRN Augusto Gamble, MD       thiamine (VITAMIN B1) tablet 100 mg  100 mg Oral Daily Rankin, Shuvon B, NP   100 mg at 08/27/22 1308   Current Outpatient Medications  Medication Sig Dispense Refill   albuterol (PROVENTIL) (2.5 MG/3ML) 0.083% nebulizer solution Inhale 3 mLs (2.5 mg total) into the lungs every 4 (four) hours.     amLODipine (NORVASC) 10 MG tablet Take 1 tablet (10 mg total) by mouth daily. 30 tablet 0   azithromycin (ZITHROMAX) 250 MG tablet Take 1 tablet (250 mg total) by mouth daily. Take first  2 tablets together, then 1 every day until finished. 6 tablet 0   [START ON 09/06/2022] budesonide-formoterol (SYMBICORT) 80-4.5 MCG/ACT inhaler Inhale 2 puffs into the lungs 2 (two) times daily.     gabapentin (NEURONTIN) 300 MG capsule Take 1 capsule (300 mg total) by mouth 3 (three) times daily. 90 capsule 0   mometasone-formoterol (DULERA) 100-5 MCG/ACT AERO Inhale 2 puffs into the lungs 2 (two) times daily.     montelukast (SINGULAIR) 10 MG tablet Take 1 tablet (10 mg total) by mouth at bedtime. 30 tablet 0   predniSONE (DELTASONE) 10 MG tablet Take 4 tablets (40 mg total) by mouth daily for 4 days. 16 tablet 0   QUEtiapine (SEROQUEL) 100 MG tablet Take 1 tablet (100 mg total) by mouth at bedtime. 30 tablet 0    Labs  Lab Results:     Latest Ref Rng & Units 08/23/2022    1:52 PM 08/23/2022   12:05 PM 08/07/2022    2:06 AM  CBC  WBC 4.0 - 10.5 K/uL  4.5  10.0   Hemoglobin 12.0 - 15.0 g/dL 65.7  84.6  96.2   Hematocrit 36.0 - 46.0 % 38.0  40.5  38.0   Platelets 150 - 400 K/uL  236  189       Latest Ref Rng & Units 08/23/2022    1:52 PM 08/23/2022   12:05 PM 08/07/2022    2:06 AM  CMP  Glucose 70 - 99 mg/dL  98  952   BUN 6 - 20 mg/dL  10  18   Creatinine 8.41 - 1.00 mg/dL  3.24  4.01   Sodium 027 - 145 mmol/L 139  137  138   Potassium 3.5 - 5.1 mmol/L 3.8  3.8  4.6   Chloride 98 - 111 mmol/L  104  106   CO2 22 - 32 mmol/L  24  25   Calcium 8.9 - 10.3 mg/dL  9.0  9.0   Total Protein 6.5 - 8.1 g/dL  6.6  6.1   Total Bilirubin 0.3 - 1.2 mg/dL  0.6  0.6   Alkaline Phos 38 - 126 U/L  68  43   AST 15 - 41 U/L  15  17   ALT 0 - 44 U/L  9  14  Blood Alcohol level:  Lab Results  Component Value Date   ETH <10 08/23/2022   ETH <10 03/23/2021   Metabolic Disorder Labs: Lab Results  Component Value Date   HGBA1C 5.5 08/26/2022   MPG 111.15 08/26/2022   MPG 111.15 08/05/2022   No results found for: "PROLACTIN" Lab Results  Component Value Date   CHOL 177 08/26/2022   TRIG 83  08/26/2022   HDL 90 08/26/2022   CHOLHDL 2.0 08/26/2022   VLDL 17 08/26/2022   LDLCALC 70 08/26/2022   LDLCALC 57 03/28/2021   Therapeutic Lab Levels: No results found for: "LITHIUM" No results found for: "VALPROATE" No results found for: "CBMZ" Physical Findings   AIMS    Flowsheet Row [facility]) [facility] (Discharged) from 05/16/2020 in [facility]PAT[facility] BE[facility]ICINE Admission (Discharged) from 03/25/2020 in [facility] INPATIENT BEHAVIORAL MEDICINE Admission (Discharged) [facility] Admission (Discharged) from 12/05/2019 in [facility] INPATIENT BEHAVIORAL MEDICINE  AIMS Total Score 0 0 0 0 0      AUDIT    Flowsheet Row Admission (Discharged) from 03/28/2021 in [facility] INPATIENT BEHAVIORAL MEDICINE Admission (Discharged) from [facility] (Discharged) [facility] Admission (Discharged) from 05/16/2020 in [facility] INPATIENT BEHAVIORAL MEDICINE Admission (Discharged) from 03/25/2020 in [facility] INPATIENT BEHAVIORAL MEDICINE  Alcohol Use Disorder Identification Test Final Score (AUDIT) 1 2 1 1 2       CAGE-AID    Flowsheet Row ED to Hosp-Admission (Discharged) from 07/04/2021 in [facility] 27M KIDNEY UNIT  CAGE-AID Score 3      GAD-7    Flowsheet Row Erroneous Encounter from 09/11/2019 in [facility]  Total GAD-7 Score 13      PHQ2-9    Flowsheet Row ED from 08/25/2022 in [facility] Office Visit from 12/21/2021 in [facility] Office Visit from 02/28/2020 in [facility] Erroneous Encounter from 09/11/2019 in [facility]  PHQ-2 Total Score 5 1 4 2   PHQ-9 Total Score 13 4 13 11       Flowsheet Row ED from  08/25/2022 in [facility] Most recent reading at 08/25/2022  2:52 PM ED from 08/23/2022 in [facility] Most recent reading at 08/24/2022  1:41 AM ED from 08/23/2022 in [facility] Emergency Department at [facility] Most recent reading at 08/23/2022 12:04 PM  C-SSRS RISK CATEGORY No Risk No Risk No Risk       Musculoskeletal  Strength & Muscle Tone: within normal limits Gait & Station: normal Patient leans: N/A  Psychiatric Specialty Exam  Presentation  General Appearance: Casual   Eye Contact:Good   Speech:Clear and Coherent; Normal Rate   Speech Volume:Normal   Handedness:Right   Mood and Affect  Mood:-- ("good")   Affect:Appropriate; Congruent; Full Range   Thought Process  Thought Processes:Coherent; Linear; Goal Directed   Descriptions of Associations:Intact   Orientation:Full (Time, Place and Person)   Thought Content:Logical; WDL  Diagnosis of Schizophrenia or Schizoaffective disorder in past: No data recorded   Hallucinations:Hallucinations: None   Ideas of Reference:None   Suicidal Thoughts:Suicidal Thoughts: No   Homicidal Thoughts:Homicidal Thoughts: No   Sensorium  Memory:Immediate Good; Recent Good; Remote Good   Judgment:Good   Insight:Good   Art therapist  Concentration:Good   Attention Span:Good   Recall:Good   Fund of Knowledge:Good   Language:Good   Psychomotor Activity  Psychomotor Activity:Psychomotor Activity: Normal   Assets  Assets:Resilience   Sleep  Sleep:Sleep: Good   Nutritional Assessment (For OBS and [facility] admissions only) Has the patient had a weight loss or gain of 10 pounds or more in the last 3 months?: No Has the patient had a decrease in food intake/or appetite?: No Does the patient have dental problems?: No Does the patient have eating habits or behaviors that may be indicators of an eating disorder including binging or  inducing vomiting?: No Has the patient recently lost weight without trying?: 0 Has the patient been eating poorly because of a decreased appetite?: 0 Malnutrition Screening Tool Score: 0    Physical Exam  Physical Exam Vitals and nursing note reviewed.  HENT:     Head: Normocephalic and atraumatic.  Pulmonary:     Effort: Pulmonary effort is normal.  Musculoskeletal:     Cervical back: Normal range of motion.  Neurological:     General: No focal deficit present.     Mental Status: She is alert. Mental status is at baseline.    Review of Systems  Constitutional: Negative.   Respiratory: Negative.    Cardiovascular: Negative.   Gastrointestinal: Negative.   Genitourinary: Negative.   Psychiatric/Behavioral:         Psychiatric subjective data addressed in PSE or HPI / daily subjective report   Blood pressure 125/77, pulse 79, temperature 98 F (36.7 C), temperature source Oral, resp. rate 20, last menstrual period 04/19/2018, SpO2 100%. There is no height or weight on file to calculate BMI.  Treatment Plan Summary: Daily contact with patient to assess and evaluate symptoms and progress in treatment and Medication management: Scheduled: -- continue home quetiapine 100 mg at bedtime for sleep -- continue home gabapentin 300 mg daily for alcohol cravings -- ID screening for past IVDU -- Patient in need of nicotine replacement; nicotine polacrilex (gum) and nicotine patch 14 mg / 24 hours ordered. Smoking cessation encouraged  PRNs              -- continue acetaminophen 650 mg every 6 hours as needed for mild to moderate pain, fever, and headaches              -- continue hydroxyzine 25 mg three times a day as needed for anxiety              -- continue bismuth subsalicylate 524 mg oral chewable tablet every 3 hours as needed for diarrhea / loose stools              -- continue senna 8.6 mg oral at bedtime and polyethylene glycol 17 g oral daily as needed for mild to moderate  constipation              -- continue ondansetron 8 mg every 8 hours as needed for nausea or vomiting              -- continue aluminum-magnesium hydroxide + simethicone 30 mL every 4 hours as needed for heartburn or indigestion              -- continue melatonin 3 mg at bedtime as needed for insomnia  -- As needed agitation protocol in-place  Augusto Gamble, MD 08/27/22 11:30 AM

## 2022-08-27 NOTE — ED Notes (Signed)
Patient resting in bed. No respiratory distress observed. SpO2 98% on room air. Patient request to have albuterol neb treatment at a later time.

## 2022-08-27 NOTE — Care Management (Signed)
[facility] Care Management   Patient was declined at [facility].  Writer left a voice mail message with Evelena Asa regarding the referral for placement.

## 2022-08-27 NOTE — ED Notes (Signed)
Patient observed/assessed in room in bed appearing in no immediate distress resting peacefully. Q15 minute checks continued by MHT and nursing staff. Will continue to monitor and support. 

## 2022-08-27 NOTE — ED Notes (Signed)
Pt is currently sleeping, no distress noted, environmental check complete, will continue to monitor patient for safety.  

## 2022-08-27 NOTE — ED Notes (Signed)
Patients SpO2-95% on room air. No s/s of respiratory distress observed. P 108.

## 2022-08-27 NOTE — ED Notes (Signed)
Patient observed/assessed at nursing station. Patient alert and oriented x 4. Affect is flat. Patient denies pain and anxiety. He denies A/V/H. He denies having any thoughts/plan of self harm and harm towards others. Fluid and snack offered. Patient states that appetite has been good throughout the day. Verbalizes no further complaints at this time. Will continue to monitor and support.

## 2022-08-27 NOTE — ED Notes (Signed)
Adult Psychoeducational Group Note  Date:  08/27/2022 Time:  6:30 PM  Group Topic/Focus:  Making Healthy Choices:   The focus of this group is to help patients identify negative/unhealthy choices they were using prior to admission and identify positive/healthier coping strategies to replace them upon discharge.  Participation Level:  Active  Participation Quality:  Appropriate  Affect:  Appropriate  Cognitive:  Appropriate  Insight: Appropriate  Engagement in Group:  Engaged  Modes of Intervention:  Discussion and Education  Additional Comments:  Pt attended and participated during group activities.  Tania Ade 08/27/2022, 6:30 PM

## 2022-08-28 DIAGNOSIS — I10 Essential (primary) hypertension: Secondary | ICD-10-CM | POA: Diagnosis not present

## 2022-08-28 DIAGNOSIS — F17210 Nicotine dependence, cigarettes, uncomplicated: Secondary | ICD-10-CM | POA: Diagnosis not present

## 2022-08-28 DIAGNOSIS — Z79899 Other long term (current) drug therapy: Secondary | ICD-10-CM | POA: Diagnosis not present

## 2022-08-28 DIAGNOSIS — F1994 Other psychoactive substance use, unspecified with psychoactive substance-induced mood disorder: Secondary | ICD-10-CM | POA: Diagnosis not present

## 2022-08-28 MED ORDER — GABAPENTIN 400 MG PO CAPS
400.0000 mg | ORAL_CAPSULE | Freq: Three times a day (TID) | ORAL | Status: DC
  Administered 2022-08-28 – 2022-08-30 (×8): 400 mg via ORAL
  Filled 2022-08-28 (×6): qty 1
  Filled 2022-08-28: qty 4
  Filled 2022-08-28: qty 1

## 2022-08-28 NOTE — ED Notes (Signed)
Patient alert and oriented.  Denies SI, HI, AVH, and pain. Scheduled medications administered to patient, per MD orders. Support and encouragement provided.  Routine safety checks conducted every 15 min.  Patient informed to notify staff with problems or concerns. No adverse drug reactions noted. Patient contracts for safety at this time.  Patient receptive, calm, and cooperative. Patient interacts well with others on the unit.  Patient remains safe at this time.

## 2022-08-28 NOTE — Group Note (Signed)
Group Topic: Wellness  Group Date: 08/28/2022 Start Time: 0910 End Time: 1000 Facilitators: Rico Sheehan, LPN  Department: [facility]  Number of Participants: 4  Group Focus: nursing group Treatment Modality:  Patient-Centered Therapy Interventions utilized were support Purpose: reinforce self-care  Name: Rachel Nolan Date of Birth: 1970/06/05  MR: [id number]    Level of Participation: minimal Quality of Participation: cooperative Interactions with others: gave feedback Mood/Affect: appropriate Triggers (if applicable):  Cognition: coherent/clear Progress: Minimal Response:  Plan: patient will be encouraged to continue with treatment plan and remain compliant with medications.   Patients Problems:  Patient Active Problem List   Diagnosis Date Noted   COPD exacerbation (HCC) 02/24/2022   Encounter for screening involving social determinants of health (SDoH) 12/21/2021   Endometrial thickening on ultrasound 12/15/2021   Hypokalemia 12/12/2021   Hyperglycemia 12/12/2021   COPD with acute exacerbation (HCC) 09/20/2021   Polysubstance abuse (HCC)    OSA (obstructive sleep apnea) 05/17/2021   Chronic respiratory failure with hypoxia (HCC) - 2 L/min 04/20/2021   History of substance abuse (HCC) 04/20/2021   Nicotine dependence, cigarettes, uncomplicated 02/12/2021   Tobacco abuse 09/24/2020   Abnormal weight loss 09/24/2020   Asthma 09/23/2020   Cocaine abuse, continuous (HCC) 07/09/2020   Opioid use disorder, moderate, dependence (HCC) 05/16/2020   Adjustment disorder with depressed mood 04/24/2020   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

## 2022-08-28 NOTE — ED Notes (Signed)
Patient observed/assessed in room in bed appearing in no immediate distress resting peacefully. Q15 minute checks continued by MHT and nursing staff. Will continue to monitor and support. 

## 2022-08-28 NOTE — Progress Notes (Addendum)
Spirituality group facilitated by Wilkie Aye, MDiv, BCC.  Group Description:  Group focused on topic of hope.  Patients participated in facilitated discussion around topic, connecting with one another around experiences and definitions for hope.  Group members engaged with visual explorer photos, reflecting on what hope looks like for them today.  Group engaged in discussion around how their definitions of hope are present today in hospital.   Modalities: Psycho-social ed, Adlerian, Narrative, MI Patient Progress: Rachel Nolan was present throughout group.  Declined to engage in group facilitated discussion.

## 2022-08-28 NOTE — ED Notes (Signed)
Patient in no sxs of distress.  Denies SI, HI, AVH - will continue to monitor for safety

## 2022-08-28 NOTE — ED Notes (Signed)
Patient in room with no sxs of distress noted - will continue to monitor for safety

## 2022-08-28 NOTE — Group Note (Signed)
Group Topic: Relapse and Recovery  Group Date: 08/28/2022 Start Time: 2000 End Time: 2056 Facilitators: Emmit Pomfret D, NT  Department: [facility]  Number of Participants: 2  Group Focus: relapse prevention Treatment Modality:  Psychoeducation Interventions utilized were support Purpose: relapse prevention strategies  Name: Rachel Nolan Date of Birth: 11-Oct-1970  MR: [id number]    Level of Participation: moderate Quality of Participation: cooperative Interactions with others: gave feedback Mood/Affect: appropriate Triggers (if applicable): n/a Cognition: coherent/clear Progress: Significant Response: n/a Plan: follow-up needed  Patients Problems:  Patient Active Problem List   Diagnosis Date Noted   COPD exacerbation (HCC) 02/24/2022   Encounter for screening involving social determinants of health (SDoH) 12/21/2021   Endometrial thickening on ultrasound 12/15/2021   Hypokalemia 12/12/2021   Hyperglycemia 12/12/2021   COPD with acute exacerbation (HCC) 09/20/2021   Polysubstance abuse (HCC)    OSA (obstructive sleep apnea) 05/17/2021   Chronic respiratory failure with hypoxia (HCC) - 2 L/min 04/20/2021   History of substance abuse (HCC) 04/20/2021   Nicotine dependence, cigarettes, uncomplicated 02/12/2021   Tobacco abuse 09/24/2020   Abnormal weight loss 09/24/2020   Asthma 09/23/2020   Cocaine abuse, continuous (HCC) 07/09/2020   Opioid use disorder, moderate, dependence (HCC) 05/16/2020   Adjustment disorder with depressed mood 04/24/2020   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

## 2022-08-28 NOTE — ED Notes (Signed)
Pt refused nebulizer tx stating, "I don't need it right now. I will take it later on tonight". No acute resp. Distress noted. Safety maintained.

## 2022-08-28 NOTE — ED Notes (Signed)
Pt resting quietly with eyes closed.  No pain or discomfort noted/voiced.  Breathing is even and unlabored.  Will continue to monitor for safety.  

## 2022-08-28 NOTE — ED Provider Notes (Cosign Needed Addendum)
Behavioral Health Progress Note  Date and Time: 08/28/2022 8:42 AM Name: Mima Ebbs MRN:  [id number]  Subjective: Rachel Nolan is a 52 y.o. fmeale w/ hx of MDD, PTSD, COPD, HTN, stimulant use disorder presenting to [facility] seeking residential rehab.   Patient feels she is doing well. She is not endorsing any cravings for or withdrawal symptoms from stimulants. She remains interested in going to rehab. Denies SI/HI/AVH. Eating well. Reports poor sleep secondary to her room being extraordinarily warm. She complains of continued anxiety and requesting to restart her on gabapentin 400 mg which she reports she was on in the past.  Patient does not endorse any side-effects they attribute to medications. Patient does not endorse any somatic complaints  Diagnosis:  Final diagnoses:  Stimulant withdrawal (HCC)    Total Time spent with patient: 30 minutes   Past Psychiatric History: MDD, PTSD  Past Medical History: COPD, essential HTN Family History: Father and sister - substance abuse  Social History:  Marital status: Single 3 children Homeless currently Uses crack cocaine, smokes cigarettes.    Sleep: Good  Appetite:  Good  Current Medications: Current Facility-Administered Medications  Medication Dose Route Frequency Provider Last Rate Last Admin   acetaminophen (TYLENOL) tablet 650 mg  650 mg Oral Q6H PRN Augusto Gamble, MD   650 mg at 08/28/22 5284   albuterol (PROVENTIL) (2.5 MG/3ML) 0.083% nebulizer solution 2.5 mg  2.5 mg Inhalation Q4H Rankin, Shuvon B, NP   2.5 mg at 08/28/22 0418   alum & mag hydroxide-simeth (MAALOX/MYLANTA) 200-200-20 MG/5ML suspension 30 mL  30 mL Oral Q4H PRN Augusto Gamble, MD       amLODipine (NORVASC) tablet 10 mg  10 mg Oral Daily Rankin, Shuvon B, NP   10 mg at 08/27/22 1324   azithromycin (ZITHROMAX) tablet 250 mg  250 mg Oral Daily Rankin, Shuvon B, NP   250 mg at 08/27/22 4010   bismuth subsalicylate (PEPTO BISMOL) chewable tablet 524 mg  524 mg  Oral Q3H PRN Augusto Gamble, MD       haloperidol (HALDOL) tablet 5 mg  5 mg Oral Q6H PRN Augusto Gamble, MD       And   LORazepam (ATIVAN) tablet 1 mg  1 mg Oral Q6H PRN Augusto Gamble, MD       And   diphenhydrAMINE (BENADRYL) capsule 25 mg  25 mg Oral Q6H PRN Augusto Gamble, MD       haloperidol lactate (HALDOL) injection 5 mg  5 mg Intramuscular Q6H PRN Augusto Gamble, MD       And   LORazepam (ATIVAN) injection 1 mg  1 mg Intravenous Q6H PRN Augusto Gamble, MD       And   diphenhydrAMINE (BENADRYL) injection 25 mg  25 mg Intramuscular Q6H PRN Augusto Gamble, MD       gabapentin (NEURONTIN) capsule 300 mg  300 mg Oral TID Rankin, Shuvon B, NP   300 mg at 08/27/22 2115   hydrOXYzine (ATARAX) tablet 25 mg  25 mg Oral TID PRN Augusto Gamble, MD       melatonin tablet 3 mg  3 mg Oral QHS PRN Augusto Gamble, MD       mometasone-formoterol [facility]) 100-5 MCG/ACT inhaler 2 puff  2 puff Inhalation BID Rankin, Shuvon B, NP   2 puff at 08/27/22 2116   montelukast (SINGULAIR) tablet 10 mg  10 mg Oral QHS Rankin, Shuvon B, NP   10 mg at 08/27/22 2115   multivitamin with minerals tablet  1 tablet  1 tablet Oral Daily Rankin, Shuvon B, NP   1 tablet at 08/27/22 4098   nicotine (NICODERM CQ - dosed in mg/24 hours) patch 14 mg  14 mg Transdermal Daily PRN Augusto Gamble, MD       nicotine polacrilex (NICORETTE) gum 2 mg  2 mg Oral PRN Augusto Gamble, MD       ondansetron [facility]) tablet 8 mg  8 mg Oral Q8H PRN Augusto Gamble, MD       polyethylene glycol (MIRALAX / GLYCOLAX) packet 17 g  17 g Oral Daily PRN Augusto Gamble, MD   17 g at 08/26/22 2107   predniSONE (DELTASONE) tablet 40 mg  40 mg Oral Daily Rankin, Shuvon B, NP   40 mg at 08/27/22 1191   QUEtiapine (SEROQUEL) tablet 100 mg  100 mg Oral QHS Rankin, Shuvon B, NP   100 mg at 08/27/22 2115   senna (SENOKOT) tablet 8.6 mg  1 tablet Oral QHS PRN Augusto Gamble, MD       thiamine (VITAMIN B1) tablet 100 mg  100 mg Oral Daily Rankin, Shuvon B, NP   100 mg at 08/27/22 4782   Current  Outpatient Medications  Medication Sig Dispense Refill   albuterol (PROVENTIL) (2.5 MG/3ML) 0.083% nebulizer solution Inhale 3 mLs (2.5 mg total) into the lungs every 4 (four) hours.     amLODipine (NORVASC) 10 MG tablet Take 1 tablet (10 mg total) by mouth daily. 30 tablet 0   azithromycin (ZITHROMAX) 250 MG tablet Take 1 tablet (250 mg total) by mouth daily. Take first 2 tablets together, then 1 every day until finished. 6 tablet 0   [START ON 09/06/2022] budesonide-formoterol (SYMBICORT) 80-4.5 MCG/ACT inhaler Inhale 2 puffs into the lungs 2 (two) times daily.     gabapentin (NEURONTIN) 300 MG capsule Take 1 capsule (300 mg total) by mouth 3 (three) times daily. 90 capsule 0   mometasone-formoterol (DULERA) 100-5 MCG/ACT AERO Inhale 2 puffs into the lungs 2 (two) times daily.     montelukast (SINGULAIR) 10 MG tablet Take 1 tablet (10 mg total) by mouth at bedtime. 30 tablet 0   QUEtiapine (SEROQUEL) 100 MG tablet Take 1 tablet (100 mg total) by mouth at bedtime. 30 tablet 0    Labs  Lab Results:     Latest Ref Rng & Units 08/23/2022    1:52 PM 08/23/2022   12:05 PM 08/07/2022    2:06 AM  CBC  WBC 4.0 - 10.5 K/uL  4.5  10.0   Hemoglobin 12.0 - 15.0 g/dL 95.6  21.3  08.6   Hematocrit 36.0 - 46.0 % 38.0  40.5  38.0   Platelets 150 - 400 K/uL  236  189       Latest Ref Rng & Units 08/23/2022    1:52 PM 08/23/2022   12:05 PM 08/07/2022    2:06 AM  CMP  Glucose 70 - 99 mg/dL  98  578   BUN 6 - 20 mg/dL  10  18   Creatinine 4.69 - 1.00 mg/dL  6.29  5.28   Sodium 413 - 145 mmol/L 139  137  138   Potassium 3.5 - 5.1 mmol/L 3.8  3.8  4.6   Chloride 98 - 111 mmol/L  104  106   CO2 22 - 32 mmol/L  24  25   Calcium 8.9 - 10.3 mg/dL  9.0  9.0   Total Protein 6.5 - 8.1 g/dL  6.6  6.1  Total Bilirubin 0.3 - 1.2 mg/dL  0.6  0.6   Alkaline Phos 38 - 126 U/L  68  43   AST 15 - 41 U/L  15  17   ALT 0 - 44 U/L  9  14     Blood Alcohol level:  Lab Results  Component Value Date   ETH <10  08/23/2022   ETH <10 03/23/2021   Metabolic Disorder Labs: Lab Results  Component Value Date   HGBA1C 5.5 08/26/2022   MPG 111.15 08/26/2022   MPG 111.15 08/05/2022   No results found for: "PROLACTIN" Lab Results  Component Value Date   CHOL 177 08/26/2022   TRIG 83 08/26/2022   HDL 90 08/26/2022   CHOLHDL 2.0 08/26/2022   VLDL 17 08/26/2022   LDLCALC 70 08/26/2022   LDLCALC 57 03/28/2021   Therapeutic Lab Levels: No results found for: "LITHIUM" No results found for: "VALPROATE" No results found for: "CBMZ" Physical Findings   AIMS    Flowsheet Row [facility]) [facility] (Discharged) from 05/16/2020 in [facility] INPATIENT BEHAVIORAL MEDICINE Admission (Discharged) from 03/25/2020 in [facility]PATIENT BE[facility] MEDICINE Admission (Discharged) [facility] Ad[facility] (Discharged) from 12/05/2019 in [facility] INPATIENT BEHAVIORAL MEDICINE  AIMS Total Score 0 0 0 0 0      AUDIT    Flowsheet Row Admission (Discharged) from 03/28/2021 in [facility] INPATIENT BEHAV[facility]AL MEDICINE Admission (Discharged) from [facility] (Discharged) [facility] Admission (Discharged) from 05/16/2020 in [facility] INPATIENT BEHAVIORAL MEDICINE Admission (Discharged) from 03/25/2020 in [facility] INPATIENT BEHAVIORAL MEDICINE  Alcohol Use Disorder Identification Test Final Score (AUDIT) 1 2 1 1 2       CAGE-AID    Flowsheet Row ED to Hosp-Admission (Discharged) from 07/04/2021 in [facility] 40M KIDNEY UNIT  CAGE-AID Score 3      GAD-7    Flowsheet Row Erroneous Encounter from 09/11/2019 in [facility]  Total GAD-7 Score 13      PHQ2-9    Flowsheet Row ED from 08/25/2022 in [facility] Office Visit from 12/21/2021 in  [facility] Office Visit from 02/28/2020 in [facility] Erroneous Encounter from 09/11/2019 in [facility]  PHQ-2 Total Score 5 1 4 2   PHQ-9 Total Score 13 4 13 11       Flowsheet Row ED from 08/25/2022 in [facility] Most recent reading at 08/25/2022  2:52 PM ED from 08/23/2022 in [facility] Most recent reading at 08/24/2022  1:41 AM ED from 08/23/2022 in [facility] at [facility] Most recent reading at 08/23/2022 12:04 PM  C-SSRS RISK CATEGORY No Risk No Risk No Risk       Musculoskeletal  Strength & Muscle Tone: within normal limits Gait & Station: normal Patient leans: N/A  Psychiatric Specialty Exam  Presentation  General Appearance: Casual   Eye Contact:Good   Speech:Clear and Coherent; Normal Rate   Speech Volume:Normal   Handedness:Right   Mood and Affect  Mood:-- ("good")   Affect:Appropriate; Congruent; Full Range   Thought Process  Thought Processes:Coherent; Linear; Goal Directed   Descriptions of Associations:Intact   Orientation:Full (Time, Place and Person)   Thought Content:Logical; WDL  Diagnosis of Schizophrenia or Schizoaffective  disorder in past: No data recorded   Hallucinations:Hallucinations: None   Ideas of Reference:None   Suicidal Thoughts:Suicidal Thoughts: No   Homicidal Thoughts:Homicidal Thoughts: No   Sensorium  Memory:Immediate Good; Recent Good; Remote Good   Judgment:Good   Insight:Good   Executive Functions  Concentration:Good   Attention Span:Good   Recall:Good   Fund of Knowledge:Good   Language:Good   Psychomotor Activity  Psychomotor Activity:Psychomotor Activity: Normal   Assets  Assets:Resilience   Sleep  Sleep:Sleep: Good   No data recorded   Physical Exam  Physical Exam Vitals and nursing note reviewed.   HENT:     Head: Normocephalic and atraumatic.  Pulmonary:     Effort: Pulmonary effort is normal.  Musculoskeletal:     Cervical back: Normal range of motion.  Neurological:     General: No focal deficit present.     Mental Status: She is alert. Mental status is at baseline.    Review of Systems  Constitutional: Negative.   Respiratory: Negative.    Cardiovascular: Negative.   Gastrointestinal: Negative.   Genitourinary: Negative.   Psychiatric/Behavioral:         Psychiatric subjective data addressed in PSE or HPI / daily subjective report   Blood pressure 133/87, pulse 93, temperature 98.3 F (36.8 C), temperature source Oral, resp. rate 20, last menstrual period 04/19/2018, SpO2 100%. There is no height or weight on file to calculate BMI.  Treatment Plan Summary: Daily contact with patient to assess and evaluate symptoms and progress in treatment and Medication management: Scheduled: -- continue home quetiapine 100 mg at bedtime for sleep -- increase gabapentin to 400 mg tid for alcohol cravings -- ID screening for past IVDU -- Patient in need of nicotine replacement; nicotine polacrilex (gum) and nicotine patch 14 mg / 24 hours ordered. Smoking cessation encouraged  PRNs              -- continue acetaminophen 650 mg every 6 hours as needed for mild to moderate pain, fever, and headaches              -- continue hydroxyzine 25 mg three times a day as needed for anxiety              -- continue bismuth subsalicylate 524 mg oral chewable tablet every 3 hours as needed for diarrhea / loose stools              -- continue senna 8.6 mg oral at bedtime and polyethylene glycol 17 g oral daily as needed for mild to moderate constipation              -- continue ondansetron 8 mg every 8 hours as needed for nausea or vomiting              -- continue aluminum-magnesium hydroxide + simethicone 30 mL every 4 hours as needed for heartburn or indigestion              -- continue  melatonin 3 mg at bedtime as needed for insomnia  -- As needed agitation protocol in-place  Park Pope, MD 08/28/22 8:42 AM

## 2022-08-28 NOTE — ED Notes (Signed)
Pt sleeping in no acute distress. RR even and unlabored. Environment secured. Will continue to monitor for safety. 

## 2022-08-29 DIAGNOSIS — Z79899 Other long term (current) drug therapy: Secondary | ICD-10-CM | POA: Diagnosis not present

## 2022-08-29 DIAGNOSIS — F17210 Nicotine dependence, cigarettes, uncomplicated: Secondary | ICD-10-CM | POA: Diagnosis not present

## 2022-08-29 DIAGNOSIS — I10 Essential (primary) hypertension: Secondary | ICD-10-CM | POA: Diagnosis not present

## 2022-08-29 DIAGNOSIS — F1994 Other psychoactive substance use, unspecified with psychoactive substance-induced mood disorder: Secondary | ICD-10-CM | POA: Diagnosis not present

## 2022-08-29 NOTE — Progress Notes (Signed)
Pt got up and ate lunch was given juice to drink.  No complaints

## 2022-08-29 NOTE — ED Notes (Signed)
Patient denies SI, HI, AVH - no sxs of distress at this time - will continue to monitor for safety

## 2022-08-29 NOTE — ED Provider Notes (Signed)
Behavioral Health Progress Note  Date and Time: 08/29/2022 10:25 AM Name: Rachel Nolan MRN:  [id number]  Subjective: Rachel Nolan is a 52 y.o. fmeale w/ hx of MDD, PTSD, COPD, HTN, stimulant use disorder presenting to [facility] seeking residential rehab.   Patient feels she is doing well. She is not endorsing any cravings for or withdrawal symptoms from stimulants. She remains interested in going to rehab. Denies SI/HI/AVH. Eating well. Reports sleep has improved since moving rooms. Reports anxiety better controlled with gabapentin 400 mg.  Patient does not endorse any side-effects they attribute to medications. Patient does not endorse any somatic complaints  Diagnosis:  Final diagnoses:  Stimulant withdrawal (HCC)    Total Time spent with patient: 30 minutes   Past Psychiatric History: MDD, PTSD  Past Medical History: COPD, essential HTN Family History: Father and sister - substance abuse  Social History:  Marital status: Single 3 children Homeless currently Uses crack cocaine, smokes cigarettes.    Sleep: Good  Appetite:  Good  Current Medications: Current Facility-Administered Medications  Medication Dose Route Frequency Provider Last Rate Last Admin   acetaminophen (TYLENOL) tablet 650 mg  650 mg Oral Q6H PRN Augusto Gamble, MD   650 mg at 08/28/22 6213   albuterol (PROVENTIL) (2.5 MG/3ML) 0.083% nebulizer solution 2.5 mg  2.5 mg Inhalation Q4H Rankin, Shuvon B, NP   2.5 mg at 08/28/22 2205   alum & mag hydroxide-simeth (MAALOX/MYLANTA) 200-200-20 MG/5ML suspension 30 mL  30 mL Oral Q4H PRN Augusto Gamble, MD       amLODipine (NORVASC) tablet 10 mg  10 mg Oral Daily Rankin, Shuvon B, NP   10 mg at 08/29/22 0942   azithromycin (ZITHROMAX) tablet 250 mg  250 mg Oral Daily Rankin, Shuvon B, NP   250 mg at 08/29/22 0865   bismuth subsalicylate (PEPTO BISMOL) chewable tablet 524 mg  524 mg Oral Q3H PRN Augusto Gamble, MD       haloperidol (HALDOL) tablet 5 mg  5 mg Oral Q6H PRN Augusto Gamble, MD       And   LORazepam (ATIVAN) tablet 1 mg  1 mg Oral Q6H PRN Augusto Gamble, MD       And   diphenhydrAMINE (BENADRYL) capsule 25 mg  25 mg Oral Q6H PRN Augusto Gamble, MD       haloperidol lactate (HALDOL) injection 5 mg  5 mg Intramuscular Q6H PRN Augusto Gamble, MD       And   LORazepam (ATIVAN) injection 1 mg  1 mg Intravenous Q6H PRN Augusto Gamble, MD       And   diphenhydrAMINE (BENADRYL) injection 25 mg  25 mg Intramuscular Q6H PRN Augusto Gamble, MD       gabapentin (NEURONTIN) capsule 400 mg  400 mg Oral TID Park Pope, MD   400 mg at 08/29/22 7846   hydrOXYzine (ATARAX) tablet 25 mg  25 mg Oral TID PRN Augusto Gamble, MD       melatonin tablet 3 mg  3 mg Oral QHS PRN Augusto Gamble, MD   3 mg at 08/28/22 2201   mometasone-formoterol (DULERA) 100-5 MCG/ACT inhaler 2 puff  2 puff Inhalation BID Rankin, Shuvon B, NP   2 puff at 08/28/22 2202   montelukast (SINGULAIR) tablet 10 mg  10 mg Oral QHS Rankin, Shuvon B, NP   10 mg at 08/28/22 2202   multivitamin with minerals tablet 1 tablet  1 tablet Oral Daily Rankin, Shuvon B, NP   1 tablet at  08/29/22 0942   nicotine (NICODERM CQ - dosed in mg/24 hours) patch 14 mg  14 mg Transdermal Daily PRN Augusto Gamble, MD       nicotine polacrilex (NICORETTE) gum 2 mg  2 mg Oral PRN Augusto Gamble, MD       ondansetron [facility]) tablet 8 mg  8 mg Oral Q8H PRN Augusto Gamble, MD       polyethylene glycol (MIRALAX / GLYCOLAX) packet 17 g  17 g Oral Daily PRN Augusto Gamble, MD   17 g at 08/26/22 2107   predniSONE (DELTASONE) tablet 40 mg  40 mg Oral Daily Rankin, Shuvon B, NP   40 mg at 08/29/22 0942   QUEtiapine (SEROQUEL) tablet 100 mg  100 mg Oral QHS Rankin, Shuvon B, NP   100 mg at 08/28/22 2201   senna (SENOKOT) tablet 8.6 mg  1 tablet Oral QHS PRN Augusto Gamble, MD       thiamine (VITAMIN B1) tablet 100 mg  100 mg Oral Daily Rankin, Shuvon B, NP   100 mg at 08/29/22 2952   Current Outpatient Medications  Medication Sig Dispense Refill   albuterol (PROVENTIL) (2.5  MG/3ML) 0.083% nebulizer solution Inhale 3 mLs (2.5 mg total) into the lungs every 4 (four) hours.     amLODipine (NORVASC) 10 MG tablet Take 1 tablet (10 mg total) by mouth daily. 30 tablet 0   azithromycin (ZITHROMAX) 250 MG tablet Take 1 tablet (250 mg total) by mouth daily. Take first 2 tablets together, then 1 every day until finished. 6 tablet 0   [START ON 09/06/2022] budesonide-formoterol (SYMBICORT) 80-4.5 MCG/ACT inhaler Inhale 2 puffs into the lungs 2 (two) times daily.     gabapentin (NEURONTIN) 300 MG capsule Take 1 capsule (300 mg total) by mouth 3 (three) times daily. 90 capsule 0   mometasone-formoterol (DULERA) 100-5 MCG/ACT AERO Inhale 2 puffs into the lungs 2 (two) times daily.     montelukast (SINGULAIR) 10 MG tablet Take 1 tablet (10 mg total) by mouth at bedtime. 30 tablet 0   QUEtiapine (SEROQUEL) 100 MG tablet Take 1 tablet (100 mg total) by mouth at bedtime. 30 tablet 0    Labs  Lab Results:     Latest Ref Rng & Units 08/23/2022    1:52 PM 08/23/2022   12:05 PM 08/07/2022    2:06 AM  CBC  WBC 4.0 - 10.5 K/uL  4.5  10.0   Hemoglobin 12.0 - 15.0 g/dL 84.1  32.4  40.1   Hematocrit 36.0 - 46.0 % 38.0  40.5  38.0   Platelets 150 - 400 K/uL  236  189       Latest Ref Rng & Units 08/23/2022    1:52 PM 08/23/2022   12:05 PM 08/07/2022    2:06 AM  CMP  Glucose 70 - 99 mg/dL  98  027   BUN 6 - 20 mg/dL  10  18   Creatinine 2.53 - 1.00 mg/dL  6.64  4.03   Sodium 474 - 145 mmol/L 139  137  138   Potassium 3.5 - 5.1 mmol/L 3.8  3.8  4.6   Chloride 98 - 111 mmol/L  104  106   CO2 22 - 32 mmol/L  24  25   Calcium 8.9 - 10.3 mg/dL  9.0  9.0   Total Protein 6.5 - 8.1 g/dL  6.6  6.1   Total Bilirubin 0.3 - 1.2 mg/dL  0.6  0.6   Alkaline Phos 38 -  126 U/L  68  43   AST 15 - 41 U/L  15  17   ALT 0 - 44 U/L  9  14     Blood Alcohol level:  Lab Results  Component Value Date   ETH <10 08/23/2022   ETH <10 03/23/2021   Metabolic Disorder Labs: Lab Results  Component Value  Date   HGBA1C 5.5 08/26/2022   MPG 111.15 08/26/2022   MPG 111.15 08/05/2022   No results found for: "PROLACTIN" Lab Results  Component Value Date   CHOL 177 08/26/2022   TRIG 83 08/26/2022   HDL 90 08/26/2022   CHOLHDL 2.0 08/26/2022   VLDL 17 08/26/2022   LDLCALC 70 08/26/2022   LDLCALC 57 03/28/2021   Therapeutic Lab Levels: No results found for: "LITHIUM" No results found for: "VALPROATE" No results found for: "CBMZ" Physical Findings   AIMS    Flowsheet Row [facility]) [facility] (Discharged) from 05/16/2020 in [facility] INPATIENT BEHAVIORAL MEDICINE Admission (Discharged) from 03/25/2020 in [facility] INPATIENT BEHAVIORAL MEDICINE Admission (Discharged) [facility] Admission (Discharged) from 12/05/2019 in [facility]PATIENT BE[facility]AVIORAL MEDICINE  AIMS Total Score 0 0 0 0 0      AUDIT    Flowsheet Row Admission (Discharged) from 03/28/2021 in [facility] INPATIENT BEHAVIORAL MEDICINE Admission (Discharged) from OP [facility] Ad[facility] (Discharged) [facility] Admission (Discharged) from 05/16/2020 in [facility] INPATIENT BEHAVIORAL MEDICINE Admission (Discharged) from 03/25/2020 in [facility] INPATIENT BEHAVIORAL MEDICINE  Alcohol Use Disorder Identification Test Final Score (AUDIT) 1 2 1 1 2       CAGE-AID    Flowsheet Row ED to Hosp-Admission (Discharged) from 07/04/2021 in [facility] 31M KIDNEY UNIT  CAGE-AID Score 3      GAD-7    Flowsheet Row Erroneous Encounter from 09/11/2019 in [facility]  Total GAD-7 Score 13      PHQ2-9    Flowsheet Row ED from 08/25/2022 in [facility] Office Visit from 12/21/2021 in [facility] Office Visit from 02/28/2020 in [facility] Erroneous Encounter from 09/11/2019 in [facility]  PHQ-2 Total Score 5 1 4 2   PHQ-9 Total Score 13 4 13 11       Flowsheet Row ED from 08/25/2022 in [facility] Most recent reading at 08/25/2022  2:52 PM ED from 08/23/2022 in [facility] Most recent reading at 08/24/2022  1:41 AM ED from 08/23/2022 in [facility] at [facility] Most recent reading at 08/23/2022 12:04 PM  C-SSRS RISK CATEGORY No Risk No Risk No Risk       Musculoskeletal  Strength & Muscle Tone: within normal limits Gait & Station: normal Patient leans: N/A  Psychiatric Specialty Exam  Presentation  General Appearance: Appropriate for Environment; Casual   Eye Contact:Good   Speech:Clear and Coherent; Normal Rate   Speech Volume:Normal   Handedness:Right   Mood and Affect  Mood:Euthymic   Affect:Appropriate; Congruent   Thought Process  Thought Processes:Coherent; Goal Directed; Linear   Descriptions of Associations:Intact   Orientation:Full (Time, Place and Person)   Thought Content:Logical  Diagnosis of Schizophrenia or Schizoaffective disorder in past: No data recorded   Hallucinations:Hallucinations: None    Ideas of Reference:None  Suicidal Thoughts:Suicidal Thoughts: No    Homicidal Thoughts:Homicidal Thoughts: No    Sensorium  Memory:Remote Good   Judgment:Good   Insight:Good   Executive Functions  Concentration:Good   Attention Span:Good   Recall:Good   Fund of Knowledge:Good   Language:Good   Psychomotor Activity  Psychomotor Activity:Psychomotor Activity: Normal    Assets  Assets:Communication Skills; Resilience; Desire for Improvement   Sleep  Sleep:Sleep: Good       Physical Exam  Physical Exam Vitals and nursing note reviewed.  HENT:     Head: Normocephalic and atraumatic.  Pulmonary:     Effort:  Pulmonary effort is normal.  Musculoskeletal:     Cervical back: Normal range of motion.  Neurological:     General: No focal deficit present.     Mental Status: She is alert. Mental status is at baseline.    Review of Systems  Constitutional: Negative.   Respiratory: Negative.    Cardiovascular: Negative.   Gastrointestinal: Negative.   Genitourinary: Negative.   Psychiatric/Behavioral:         Psychiatric subjective data addressed in PSE or HPI / daily subjective report   Blood pressure 110/80, pulse 92, temperature 97.7 F (36.5 C), temperature source Oral, resp. rate 19, last menstrual period 04/19/2018, SpO2 98%. There is no height or weight on file to calculate BMI.  Treatment Plan Summary: Daily contact with patient to assess and evaluate symptoms and progress in treatment and Medication management: Scheduled: -- continue home quetiapine 100 mg at bedtime for sleep -- increase gabapentin to 400 mg tid for alcohol cravings -- ID screening for past IVDU -- Patient in need of nicotine replacement; nicotine polacrilex (gum) and nicotine patch 14 mg / 24 hours ordered. Smoking cessation encouraged  PRNs              -- continue acetaminophen 650 mg every 6 hours as needed for mild to moderate pain, fever, and headaches              -- continue hydroxyzine 25 mg three times a day as needed for anxiety              -- continue bismuth subsalicylate 524 mg oral chewable tablet every 3 hours as needed for diarrhea / loose stools              -- continue senna 8.6 mg oral at bedtime and polyethylene glycol 17 g oral daily as needed for mild to moderate constipation              -- continue ondansetron 8 mg every 8 hours as needed for nausea or vomiting              -- continue aluminum-magnesium hydroxide + simethicone 30 mL every 4 hours as needed for heartburn or indigestion              -- continue melatonin 3 mg at bedtime as needed for insomnia  -- As needed agitation protocol  in-place  Park Pope, MD 08/29/22 10:25 AM

## 2022-08-29 NOTE — ED Notes (Signed)
During group tonight Pt was tearful telling her story. We had a really great talk about how she wants to change her life.  Upon my rounds MHT saw Pt on the floor crying and praying. Let her know we are here if she wants to talk.

## 2022-08-29 NOTE — Group Note (Signed)
Group Topic: Wellness  Group Date: 08/29/2022 Start Time: 1000 End Time: 1020 Facilitators: Priscille Kluver, NT  Department: [facility]  Number of Participants: 0  Group Focus: relaxation Treatment Modality:  Skills Training Interventions utilized were support Purpose: regain self-worth  Name: Rachel Nolan Date of Birth: 02-Dec-1970  MR: [id number]    Level of Participation: Pt did not attend group  Patients Problems:  Patient Active Problem List   Diagnosis Date Noted   COPD exacerbation (HCC) 02/24/2022   Encounter for screening involving social determinants of health (SDoH) 12/21/2021   Endometrial thickening on ultrasound 12/15/2021   Hypokalemia 12/12/2021   Hyperglycemia 12/12/2021   COPD with acute exacerbation (HCC) 09/20/2021   Polysubstance abuse (HCC)    OSA (obstructive sleep apnea) 05/17/2021   Chronic respiratory failure with hypoxia (HCC) - 2 L/min 04/20/2021   History of substance abuse (HCC) 04/20/2021   Nicotine dependence, cigarettes, uncomplicated 02/12/2021   Tobacco abuse 09/24/2020   Abnormal weight loss 09/24/2020   Asthma 09/23/2020   Cocaine abuse, continuous (HCC) 07/09/2020   Opioid use disorder, moderate, dependence (HCC) 05/16/2020   Adjustment disorder with depressed mood 04/24/2020   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   Essential hypertension 03/26/2020   Neuropathic pain 03/26/2020   Severe recurrent major depression with psychotic features (HCC) 03/25/2020   Major depressive disorder with psychotic features (HCC) 01/24/2020   Alcohol use disorder, severe, dependence (HCC) 12/06/2019   Cocaine use disorder (HCC) 03/05/2019   Post traumatic stress disorder (PTSD) 03/02/2019   Anxiety and depression 03/02/2019   Substance induced mood disorder (HCC) 03/02/2019   MDD (major depressive disorder) 03/01/2019

## 2022-08-29 NOTE — ED Notes (Signed)
Patient resting with no sxs of distress noted - will continue to monitor for safety 

## 2022-08-29 NOTE — ED Notes (Signed)
Patient resting with no sxs of distress - will continue to monitor for safety 

## 2022-08-29 NOTE — ED Notes (Signed)
Eating dinner

## 2022-08-29 NOTE — Progress Notes (Signed)
Pt continues to follow rules of unit.  Denies SI, HI or AVH  Refused to go to the morning group with MHT.  Pt stated that she wanted to "lay down and read".  Staff will cont to monitor for safety.

## 2022-08-29 NOTE — ED Notes (Signed)
Pt awake and alert sitting in dayroom.  No distress noted.  Continues to use her walker with fall precautions.  Staff will cont to monitor for safety.

## 2022-08-29 NOTE — Group Note (Signed)
Group Topic: Recovery Basics  Group Date: 08/29/2022 Start Time: 1400 End Time: 1500 Facilitators: Vernie Shanks, RN  Department: [facility]  Number of Participants: 1 Group Focus: substance abuse education Treatment Modality:  Psychoeducation Interventions utilized were patient education Purpose: relapse prevention strategies  Name: Rachel Nolan Date of Birth: November 08, 1970  MR: [id number]    Pt did not attend group  Patients Problems:  Patient Active Problem List   Diagnosis Date Noted   COPD exacerbation (HCC) 02/24/2022   Encounter for screening involving social determinants of health (SDoH) 12/21/2021   Endometrial thickening on ultrasound 12/15/2021   Hypokalemia 12/12/2021   Hyperglycemia 12/12/2021   COPD with acute exacerbation (HCC) 09/20/2021   Polysubstance abuse (HCC)    OSA (obstructive sleep apnea) 05/17/2021   Chronic respiratory failure with hypoxia (HCC) - 2 L/min 04/20/2021   History of substance abuse (HCC) 04/20/2021   Nicotine dependence, cigarettes, uncomplicated 02/12/2021   Tobacco abuse 09/24/2020   Abnormal weight loss 09/24/2020   Asthma 09/23/2020   Cocaine abuse, continuous (HCC) 07/09/2020   Opioid use disorder, moderate, dependence (HCC) 05/16/2020   Adjustment disorder with depressed mood 04/24/2020   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   Essential hypertension 03/26/2020   Neuropathic pain 03/26/2020   Severe recurrent major depression with psychotic features (HCC) 03/25/2020   Major depressive disorder with psychotic features (HCC) 01/24/2020   Alcohol use disorder, severe, dependence (HCC) 12/06/2019   Cocaine use disorder (HCC) 03/05/2019   Post traumatic stress disorder (PTSD) 03/02/2019   Anxiety and depression 03/02/2019   Substance induced mood disorder (HCC) 03/02/2019   MDD (major depressive disorder) 03/01/2019

## 2022-08-29 NOTE — Group Note (Signed)
Group Topic: Communication  Group Date: 08/29/2022 Start Time: 2000 End Time: 2100 Facilitators: Rae Lips B  Department: [facility]  Number of Participants: 3  Group Focus: activities of daily living skills, communication, daily focus, depression, family, and feeling awareness/expression Treatment Modality:  Spiritual Interventions utilized were leisure development, problem solving, story telling, and support Purpose: enhance coping skills, express feelings, and relapse prevention strategies  Name: Rachel Nolan Date of Birth: November 07, 1970  MR: [id number]    Level of Participation: active Quality of Participation: attentive and cooperative Interactions with others: gave feedback Mood/Affect: appropriate, positive, and tearful Triggers (if applicable): Worried about getting back on the streets. She really wants a place to go to so she's not on the streets using again. She said she really wants her life back and this is the first time she's been in a place like this.  Cognition: coherent/clear and insightful Progress: Gaining insight Response: She seems like she really wants to change her life around.  Plan: patient will be encouraged to Keep going to groups. Told her if she needs anyone to talk to we are here.   Patients Problems:  Patient Active Problem List   Diagnosis Date Noted   COPD exacerbation (HCC) 02/24/2022   Encounter for screening involving social determinants of health (SDoH) 12/21/2021   Endometrial thickening on ultrasound 12/15/2021   Hypokalemia 12/12/2021   Hyperglycemia 12/12/2021   COPD with acute exacerbation (HCC) 09/20/2021   Polysubstance abuse (HCC)    OSA (obstructive sleep apnea) 05/17/2021   Chronic respiratory failure with hypoxia (HCC) - 2 L/min 04/20/2021   History of substance abuse (HCC) 04/20/2021   Nicotine dependence, cigarettes, uncomplicated 02/12/2021   Tobacco abuse 09/24/2020   Abnormal weight loss  09/24/2020   Asthma 09/23/2020   Cocaine abuse, continuous (HCC) 07/09/2020   Opioid use disorder, moderate, dependence (HCC) 05/16/2020   Adjustment disorder with depressed mood 04/24/2020   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   Essential hypertension 03/26/2020   Neuropathic pain 03/26/2020   Severe recurrent major depression with psychotic features (HCC) 03/25/2020   Major depressive disorder with psychotic features (HCC) 01/24/2020   Alcohol use disorder, severe, dependence (HCC) 12/06/2019   Cocaine use disorder (HCC) 03/05/2019   Post traumatic stress disorder (PTSD) 03/02/2019   Anxiety and depression 03/02/2019   Substance induced mood disorder (HCC) 03/02/2019   MDD (major depressive disorder) 03/01/2019

## 2022-08-30 DIAGNOSIS — F17210 Nicotine dependence, cigarettes, uncomplicated: Secondary | ICD-10-CM | POA: Diagnosis not present

## 2022-08-30 DIAGNOSIS — Z79899 Other long term (current) drug therapy: Secondary | ICD-10-CM | POA: Diagnosis not present

## 2022-08-30 DIAGNOSIS — F1994 Other psychoactive substance use, unspecified with psychoactive substance-induced mood disorder: Secondary | ICD-10-CM | POA: Diagnosis not present

## 2022-08-30 DIAGNOSIS — I10 Essential (primary) hypertension: Secondary | ICD-10-CM | POA: Diagnosis not present

## 2022-08-30 NOTE — ED Notes (Signed)
Patient continues to rest with no sxs of distress noted - will continue to monitor for safety 

## 2022-08-30 NOTE — ED Notes (Addendum)
Pt sitting in the courtyard interacting with staff and peers. No acute distress noted. No concerns voiced. Informed pt to notify staff with any needs or assistance. Pt verbalized understanding or agreement. Will continue to monitor for safety.
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
## 2022-08-30 NOTE — Discharge Instructions (Signed)
Dear [name],  It was a pleasure to take care of you during [facility] stay at [facility] where you were treated for your Substance induced mood disorder (HCC).  While you were here, you were:  observed and cared for by our nurses and nursing assistants  treated with medications by your psychiatrists  evaluated with imaging / lab tests, and treated with medicines / procedures by your doctors  provided individual and group therapy by therapists  provided resources by our social workers and case managers  Please review the medication list provided to you at discharge and stop, start taking, or continue taking the medications listed there.  You should also follow-up with your primary care doctor, or start seeing one if you don't have one yet. If applicable, here are some scheduled follow-ups for you: Internal Medicine Teaching Service - 09/15/2022 @ 9:15 am   I recommend abstinence from alcohol, tobacco, and other illicit drug use.   If your psychiatric symptoms or suicidal thoughts recur, worsen, or if you have side effects to your psychiatric medications, call your outpatient psychiatric provider, 911, [phone] or go to the nearest emergency department.  Take care!  Signed: Augusto Gamble, MD 08/30/2022, 11:31 AM  Naloxone (Narcan) can help reverse an overdose when given to the victim quickly.  [place] offers free naloxone kits and instructions/training on its use.  Add naloxone to your first aid kit and you can help save a life. A prescription can be filled at your local pharmacy or free kits are provided by the county.  Pick up your free kit at the following locations:   [place]:  [facility], [address] [place] [address] [phone]) [facility] [address] [place] [address] [phone]) [facility] [address] [place] [address]  [place]: [facility] [address] [place] [address] ([phone]) [facility] [address] [place] [address] [phone])

## 2022-08-30 NOTE — Progress Notes (Signed)
Patient is in her room, on her bed, eyes closed.  Respirations are even and unlabored.  Patient was compliant with medication regimen as ordered at bedtime.

## 2022-08-30 NOTE — ED Notes (Signed)
Pt sitting in dayroom interacting with peers. No acute distress noted. No concerns voiced. Informed pt to notify staff with any needs or assistance. Pt verbalized understanding or agreement. Will continue to monitor for safety. 

## 2022-08-30 NOTE — ED Notes (Signed)
Pt using oxygen tank at nurses station

## 2022-08-30 NOTE — ED Notes (Signed)
MHT keeps waiting pt up during rounds bc the door gets stuck and she keeps closing it bc of the light so she's been up and down all night.

## 2022-08-30 NOTE — ED Notes (Addendum)
Patient A&Ox4. Denies intent to harm self/others when asked. Denies A/VH. Patient c/o L lower tooth ache. Tylenol given. No other complaints voiced when asked. Continue to utilize walker for ambulation. Tolerate well. Support and encouragement provided. Routine safety checks conducted according to facility protocol. Encouraged patient to notify staff if thoughts of harm toward self or others arise. Patient verbalize understanding and agreement. Will continue to monitor for safety.

## 2022-08-30 NOTE — Group Note (Signed)
Group Topic: Fears and Unhealthy Coping Skills  Group Date: 08/30/2022 Start Time: 2000 End Time: 2023 Facilitators: Emmit Pomfret D, NT  Department: [facility]  Number of Participants: 5  Group Focus: coping skills Treatment Modality:  Psychoeducation Interventions utilized were support Purpose: enhance coping skills  Name: Rachel Nolan Date of Birth: Jan 20, 1970  MR: [id number]    Level of Participation: moderate Quality of Participation: cooperative Interactions with others: gave feedback Mood/Affect: appropriate Triggers (if applicable): n/a Cognition: coherent/clear Progress: Significant Response: n/a Plan: follow-up needed  Patients Problems:  Patient Active Problem List   Diagnosis Date Noted   COPD exacerbation (HCC) 02/24/2022   Encounter for screening involving social determinants of health (SDoH) 12/21/2021   Endometrial thickening on ultrasound 12/15/2021   Hypokalemia 12/12/2021   Hyperglycemia 12/12/2021   COPD with acute exacerbation (HCC) 09/20/2021   Polysubstance abuse (HCC)    OSA (obstructive sleep apnea) 05/17/2021   Chronic respiratory failure with hypoxia (HCC) - 2 L/min 04/20/2021   History of substance abuse (HCC) 04/20/2021   Nicotine dependence, cigarettes, uncomplicated 02/12/2021   Tobacco abuse 09/24/2020   Abnormal weight loss 09/24/2020   Asthma 09/23/2020   Cocaine abuse, continuous (HCC) 07/09/2020   Opioid use disorder, moderate, dependence (HCC) 05/16/2020   Adjustment disorder with depressed mood 04/24/2020   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

## 2022-08-30 NOTE — Progress Notes (Signed)
Patient is currently in her bedroom, reporting that she is "straightening her room up".  She is calm and cooperative.  She maintains appropriate conversation with good eye contact.  She is cooperative with care and voices no concerns, no complaints and no questions thus far this shift, during initial shift assessment.

## 2022-08-30 NOTE — Group Note (Signed)
Group Topic: Recovery Basics AA Group Meeting Group Date: 08/30/2022 Start Time: 1000 End Time: 1100 Facilitators: Vonzell Schlatter B  Department: [facility]  Number of Participants: 4  Group Focus: relapse prevention Treatment Modality:  Psychoeducation Interventions utilized were support Purpose: relapse prevention strategies  Name: Rachel Nolan Date of Birth: 11-Nov-1970  MR: [id number]    Level of Participation: Did Not Attend group Quality of Participation:  Interactions with others:  Mood/Affect:  Triggers (if applicable):  Cognition:  Progress: Other Response:  Plan: follow-up needed  Patients Problems:  Patient Active Problem List   Diagnosis Date Noted   COPD exacerbation (HCC) 02/24/2022   Encounter for screening involving social determinants of health (SDoH) 12/21/2021   Endometrial thickening on ultrasound 12/15/2021   Hypokalemia 12/12/2021   Hyperglycemia 12/12/2021   COPD with acute exacerbation (HCC) 09/20/2021   Polysubstance abuse (HCC)    OSA (obstructive sleep apnea) 05/17/2021   Chronic respiratory failure with hypoxia (HCC) - 2 L/min 04/20/2021   History of substance abuse (HCC) 04/20/2021   Nicotine dependence, cigarettes, uncomplicated 02/12/2021   Tobacco abuse 09/24/2020   Abnormal weight loss 09/24/2020   Asthma 09/23/2020   Cocaine abuse, continuous (HCC) 07/09/2020   Opioid use disorder, moderate, dependence (HCC) 05/16/2020   Adjustment disorder with depressed mood 04/24/2020   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   Essential hypertension 03/26/2020   Neuropathic pain 03/26/2020   Severe recurrent major depression with psychotic features (HCC) 03/25/2020   Major depressive disorder with psychotic features (HCC) 01/24/2020   Alcohol use disorder, severe, dependence (HCC) 12/06/2019   Cocaine use disorder (HCC) 03/05/2019   Post traumatic stress disorder (PTSD) 03/02/2019   Anxiety and depression  03/02/2019   Substance induced mood disorder (HCC) 03/02/2019   MDD (major depressive disorder) 03/01/2019

## 2022-08-30 NOTE — ED Provider Notes (Cosign Needed Addendum)
Behavioral Health Progress Note  Date and Time: 08/30/2022 7:59 AM Name: Rachel Nolan MRN:  [id number]  Subjective: Rachel Nolan is a 52 y.o. female w/ hx of MDD, PTSD, COPD, HTN, stimulant use disorder presenting to [facility] seeking residential rehab.   Patient denies experiencing any withdrawal symptoms. She complains of a tooth ache on the left side of her mouth that she says has been going on since last night. She says she has experienced tooth pain like this in the past but has not seen a dentist for it.  She continues to be interested in residential placement. We explained to patient that she may have difficulty getting in to a facility given her extensive oxygen needs and may need to go to [facility]. Patient was amenable to this plan.  Patient does not endorse any side-effects they attribute to medications. Patient does not endorse any somatic complaints  Diagnosis:  Final diagnoses:  Stimulant withdrawal (HCC)    Total Time spent with patient: 30 minutes   Past Psychiatric History: MDD, PTSD  Past Medical History: COPD, essential HTN Family History: Father and sister - substance abuse  Social History:  Marital status: Single 3 children Homeless currently Uses crack cocaine, smokes cigarettes.    Sleep: Good  Appetite:  Good  Current Medications: Current Facility-Administered Medications  Medication Dose Route Frequency Provider Last Rate Last Admin   acetaminophen (TYLENOL) tablet 650 mg  650 mg Oral Q6H PRN Augusto Gamble, MD   650 mg at 08/28/22 4098   albuterol (PROVENTIL) (2.5 MG/3ML) 0.083% nebulizer solution 2.5 mg  2.5 mg Inhalation Q4H Rankin, Shuvon B, NP   2.5 mg at 08/30/22 0312   alum & mag hydroxide-simeth (MAALOX/MYLANTA) 200-200-20 MG/5ML suspension 30 mL  30 mL Oral Q4H PRN Augusto Gamble, MD       amLODipine (NORVASC) tablet 10 mg  10 mg Oral Daily Rankin, Shuvon B, NP   10 mg at 08/29/22 0942   azithromycin (ZITHROMAX) tablet 250 mg  250 mg Oral Daily Rankin,  Shuvon B, NP   250 mg at 08/29/22 1191   bismuth subsalicylate (PEPTO BISMOL) chewable tablet 524 mg  524 mg Oral Q3H PRN Augusto Gamble, MD       haloperidol (HALDOL) tablet 5 mg  5 mg Oral Q6H PRN Augusto Gamble, MD       And   LORazepam (ATIVAN) tablet 1 mg  1 mg Oral Q6H PRN Augusto Gamble, MD       And   diphenhydrAMINE (BENADRYL) capsule 25 mg  25 mg Oral Q6H PRN Augusto Gamble, MD       haloperidol lactate (HALDOL) injection 5 mg  5 mg Intramuscular Q6H PRN Augusto Gamble, MD       And   LORazepam (ATIVAN) injection 1 mg  1 mg Intravenous Q6H PRN Augusto Gamble, MD       And   diphenhydrAMINE (BENADRYL) injection 25 mg  25 mg Intramuscular Q6H PRN Augusto Gamble, MD       gabapentin (NEURONTIN) capsule 400 mg  400 mg Oral TID Park Pope, MD   400 mg at 08/29/22 2134   hydrOXYzine (ATARAX) tablet 25 mg  25 mg Oral TID PRN Augusto Gamble, MD       melatonin tablet 3 mg  3 mg Oral QHS PRN Augusto Gamble, MD   3 mg at 08/28/22 2201   mometasone-formoterol (DULERA) 100-5 MCG/ACT inhaler 2 puff  2 puff Inhalation BID Rankin, Shuvon B, NP   2 puff at 08/29/22  2111   montelukast (SINGULAIR) tablet 10 mg  10 mg Oral QHS Rankin, Shuvon B, NP   10 mg at 08/28/22 2202   multivitamin with minerals tablet 1 tablet  1 tablet Oral Daily Rankin, Shuvon B, NP   1 tablet at 08/29/22 0865   nicotine (NICODERM CQ - dosed in mg/24 hours) patch 14 mg  14 mg Transdermal Daily PRN Augusto Gamble, MD       nicotine polacrilex (NICORETTE) gum 2 mg  2 mg Oral PRN Augusto Gamble, MD       ondansetron [facility]) tablet 8 mg  8 mg Oral Q8H PRN Augusto Gamble, MD       polyethylene glycol (MIRALAX / GLYCOLAX) packet 17 g  17 g Oral Daily PRN Augusto Gamble, MD   17 g at 08/26/22 2107   predniSONE (DELTASONE) tablet 40 mg  40 mg Oral Daily Rankin, Shuvon B, NP   40 mg at 08/29/22 0942   QUEtiapine (SEROQUEL) tablet 100 mg  100 mg Oral QHS Rankin, Shuvon B, NP   100 mg at 08/29/22 2111   senna (SENOKOT) tablet 8.6 mg  1 tablet Oral QHS PRN Augusto Gamble, MD        thiamine (VITAMIN B1) tablet 100 mg  100 mg Oral Daily Rankin, Shuvon B, NP   100 mg at 08/29/22 7846   Current Outpatient Medications  Medication Sig Dispense Refill   albuterol (PROVENTIL) (2.5 MG/3ML) 0.083% nebulizer solution Inhale 3 mLs (2.5 mg total) into the lungs every 4 (four) hours.     amLODipine (NORVASC) 10 MG tablet Take 1 tablet (10 mg total) by mouth daily. 30 tablet 0   azithromycin (ZITHROMAX) 250 MG tablet Take 1 tablet (250 mg total) by mouth daily. Take first 2 tablets together, then 1 every day until finished. 6 tablet 0   [START ON 09/06/2022] budesonide-formoterol (SYMBICORT) 80-4.5 MCG/ACT inhaler Inhale 2 puffs into the lungs 2 (two) times daily.     gabapentin (NEURONTIN) 300 MG capsule Take 1 capsule (300 mg total) by mouth 3 (three) times daily. 90 capsule 0   mometasone-formoterol (DULERA) 100-5 MCG/ACT AERO Inhale 2 puffs into the lungs 2 (two) times daily.     montelukast (SINGULAIR) 10 MG tablet Take 1 tablet (10 mg total) by mouth at bedtime. 30 tablet 0   QUEtiapine (SEROQUEL) 100 MG tablet Take 1 tablet (100 mg total) by mouth at bedtime. 30 tablet 0    Labs  Lab Results:     Latest Ref Rng & Units 08/23/2022    1:52 PM 08/23/2022   12:05 PM 08/07/2022    2:06 AM  CBC  WBC 4.0 - 10.5 K/uL  4.5  10.0   Hemoglobin 12.0 - 15.0 g/dL 96.2  95.2  84.1   Hematocrit 36.0 - 46.0 % 38.0  40.5  38.0   Platelets 150 - 400 K/uL  236  189       Latest Ref Rng & Units 08/23/2022    1:52 PM 08/23/2022   12:05 PM 08/07/2022    2:06 AM  CMP  Glucose 70 - 99 mg/dL  98  324   BUN 6 - 20 mg/dL  10  18   Creatinine 4.01 - 1.00 mg/dL  0.27  2.53   Sodium 664 - 145 mmol/L 139  137  138   Potassium 3.5 - 5.1 mmol/L 3.8  3.8  4.6   Chloride 98 - 111 mmol/L  104  106   CO2 22 - 32  mmol/L  24  25   Calcium 8.9 - 10.3 mg/dL  9.0  9.0   Total Protein 6.5 - 8.1 g/dL  6.6  6.1   Total Bilirubin 0.3 - 1.2 mg/dL  0.6  0.6   Alkaline Phos 38 - 126 U/L  68  43   AST 15 - 41 U/L   15  17   ALT 0 - 44 U/L  9  14     Blood Alcohol level:  Lab Results  Component Value Date   ETH <10 08/23/2022   ETH <10 03/23/2021   Metabolic Disorder Labs: Lab Results  Component Value Date   HGBA1C 5.5 08/26/2022   MPG 111.15 08/26/2022   MPG 111.15 08/05/2022   No results found for: "PROLACTIN" Lab Results  Component Value Date   CHOL 177 08/26/2022   TRIG 83 08/26/2022   HDL 90 08/26/2022   CHOLHDL 2.0 08/26/2022   VLDL 17 08/26/2022   LDLCALC 70 08/26/2022   LDLCALC 57 03/28/2021   Therapeutic Lab Levels: No results found for: "LITHIUM" No results found for: "VALPROATE" No results found for: "CBMZ" Physical Findings   AIMS    Flowsheet Row [facility]) [facility]Discharged[facility] from 05/16/2020 in [facility] INPATIENT BEHAVIORAL MEDICINE Admission (Discharged) from 03/25/2020 in [facility] INPATIENT BEHAVIORAL MEDICINE Admission (Discharged) [facility] Admission (Discharged) from 12/05/2019 in [facility] INPATIENT BEHAVIORAL MEDICINE  AIMS Total Score 0 0 0 0 0      AUDIT    Flowsheet Row Admission (Discharged) from 03/28/2021 in [facility] INPATIENT BEHAVIORAL MEDICINE Admission (Discharged) from [facility] Ad[facility] (Discharged) [facility] Admission (Discharged) from 05/16/2020 in [facility] INPATIENT BEHAVIORAL MEDICINE Admission (Discharged) from 03/25/2020 in [facility] INPATIENT BEHAVIORAL MEDICINE  Alcohol Use Disorder Identification Test Final Score (AUDIT) 1 2 1 1 2       CAGE-AID    Flowsheet Row ED to Hosp-Admission (Discharged) from 07/04/2021 in [facility] 45M KIDNEY UNIT  CAGE-AID Score 3      GAD-7    Flowsheet Row Erroneous Encounter from 09/11/2019 in [facility]  Total GAD-7 Score 13      PHQ2-9     Flowsheet Row ED from 08/25/2022 in [facility] Office Visit from 12/21/2021 in [facility] Office Visit from 02/28/2020 in [facility] Erroneous Encounter from 09/11/2019 in [facility]  PHQ-2 Total Score 5 1 4 2   PHQ-9 Total Score 13 4 13 11       Flowsheet Row ED from 08/25/2022 in [facility] Most recent reading at 08/25/2022  2:52 PM ED from 08/23/2022 in [facility] Most recent reading at 08/24/2022  1:41 AM ED from 08/23/2022 in [facility] at [facility] Most recent reading at 08/23/2022 12:04 PM  C-SSRS RISK CATEGORY No Risk No Risk No Risk       Musculoskeletal  Strength & Muscle Tone: within normal limits Gait & Station: normal Patient leans: N/A  Psychiatric Specialty Exam  Presentation  General Appearance: Appropriate for Environment; Casual   Eye Contact:Good   Speech:Clear and Coherent; Normal Rate   Speech Volume:Normal   Handedness:Right   Mood and Affect  Mood:Euthymic   Affect:Appropriate; Congruent   Thought  Process  Thought Processes:Coherent; Goal Directed; Linear   Descriptions of Associations:Intact   Orientation:Full (Time, Place and Person)   Thought Content:Logical  Diagnosis of Schizophrenia or Schizoaffective disorder in past: No data recorded   Hallucinations:Hallucinations: None    Ideas of Reference:None   Suicidal Thoughts:Suicidal Thoughts: No    Homicidal Thoughts:Homicidal Thoughts: No    Sensorium  Memory:Remote Good   Judgment:Good   Insight:Good   Executive Functions  Concentration:Good   Attention Span:Good   Recall:Good   Fund of Knowledge:Good   Language:Good   Psychomotor Activity  Psychomotor Activity:Psychomotor Activity: Normal    Assets  Assets:Communication Skills; Resilience;  Desire for Improvement   Sleep  Sleep:Sleep: Good       Physical Exam  Physical Exam Vitals and nursing note reviewed.  HENT:     Head: Normocephalic and atraumatic.  Pulmonary:     Effort: Pulmonary effort is normal.  Musculoskeletal:     Cervical back: Normal range of motion.  Neurological:     General: No focal deficit present.     Mental Status: She is alert. Mental status is at baseline.    Review of Systems  Constitutional: Negative.   HENT:         Endorses L sided tooth pain  Respiratory: Negative.    Cardiovascular: Negative.   Gastrointestinal: Negative.   Genitourinary: Negative.   Psychiatric/Behavioral:         Psychiatric subjective data addressed in PSE or HPI / daily subjective report   Blood pressure 132/84, pulse 100, temperature 98.7 F (37.1 C), temperature source Tympanic, resp. rate 20, last menstrual period 04/19/2018, SpO2 98%. There is no height or weight on file to calculate BMI.  Treatment Plan Summary: Daily contact with patient to assess and evaluate symptoms and progress in treatment and Medication management: -- continue home quetiapine 100 mg at bedtime for sleep -- continue gabapentin 400 mg three times daily for alcohol cravings -- ID screening for past IVDU (negative labs) -- Patient in need of nicotine replacement; nicotine polacrilex (gum) and nicotine patch 14 mg / 24 hours ordered. Smoking cessation encouraged  PRNs              -- continue acetaminophen 650 mg every 6 hours as needed for mild to moderate pain, fever, and headaches              -- continue hydroxyzine 25 mg three times a day as needed for anxiety              -- continue bismuth subsalicylate 524 mg oral chewable tablet every 3 hours as needed for diarrhea / loose stools              -- continue senna 8.6 mg oral at bedtime and polyethylene glycol 17 g oral daily as needed for mild to moderate constipation              -- continue ondansetron 8 mg every 8 hours  as needed for nausea or vomiting              -- continue aluminum-magnesium hydroxide + simethicone 30 mL every 4 hours as needed for heartburn or indigestion              -- continue melatonin 3 mg at bedtime as needed for insomnia  -- As needed agitation protocol in-place  Augusto Gamble, MD 08/30/22 7:59 AM

## 2022-08-30 NOTE — Group Note (Signed)
Group Topic: Decisional Balance/Substance Abuse  Group Date: 08/30/2022 Start Time: 1700 End Time: 1720 Facilitators: Prentice Docker, RN  Department: [facility]  Number of Participants: 5  Group Focus: self-awareness Treatment Modality:  Individual Therapy Interventions utilized were reality testing Purpose: express feelings  Name: Rachel Nolan Date of Birth: 06-06-1970  MR: [id number]    Level of Participation: active Quality of Participation: cooperative and motivated Interactions with others: gave feedback Mood/Affect: appropriate and positive Triggers (if applicable): being homeless Cognition: insightful Progress: Gaining insight Response: "I know if I continue the way I'm living, I'm gonna die out there on the streets. I've got to do better" Plan: patient will be encouraged to stay focused on remaining free of drugs and attend her meetings once discharged  Patients Problems:  Patient Active Problem List   Diagnosis Date Noted   COPD exacerbation (HCC) 02/24/2022   Encounter for screening involving social determinants of health (SDoH) 12/21/2021   Endometrial thickening on ultrasound 12/15/2021   Hypokalemia 12/12/2021   Hyperglycemia 12/12/2021   COPD with acute exacerbation (HCC) 09/20/2021   Polysubstance abuse (HCC)    OSA (obstructive sleep apnea) 05/17/2021   Chronic respiratory failure with hypoxia (HCC) - 2 L/min 04/20/2021   History of substance abuse (HCC) 04/20/2021   Nicotine dependence, cigarettes, uncomplicated 02/12/2021   Tobacco abuse 09/24/2020   Abnormal weight loss 09/24/2020   Asthma 09/23/2020   Cocaine abuse, continuous (HCC) 07/09/2020   Opioid use disorder, moderate, dependence (HCC) 05/16/2020   Adjustment disorder with depressed mood 04/24/2020   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   Essential hypertension 03/26/2020   Neuropathic pain 03/26/2020   Severe recurrent major depression with  psychotic features (HCC) 03/25/2020   Major depressive disorder with psychotic features (HCC) 01/24/2020   Alcohol use disorder, severe, dependence (HCC) 12/06/2019   Cocaine use disorder (HCC) 03/05/2019   Post traumatic stress disorder (PTSD) 03/02/2019   Anxiety and depression 03/02/2019   Substance induced mood disorder (HCC) 03/02/2019   MDD (major depressive disorder) 03/01/2019

## 2022-08-31 ENCOUNTER — Other Ambulatory Visit: Payer: Self-pay

## 2022-08-31 ENCOUNTER — Emergency Department (HOSPITAL_COMMUNITY): Payer: No Typology Code available for payment source

## 2022-08-31 ENCOUNTER — Encounter (HOSPITAL_COMMUNITY): Payer: Self-pay

## 2022-08-31 ENCOUNTER — Emergency Department (HOSPITAL_COMMUNITY)
Admission: EM | Admit: 2022-08-31 | Discharge: 2022-08-31 | Disposition: A | Discharge status: Home / Self Care | Payer: No Typology Code available for payment source | Attending: Emergency Medicine | Admitting: Emergency Medicine

## 2022-08-31 DIAGNOSIS — I10 Essential (primary) hypertension: Secondary | ICD-10-CM | POA: Diagnosis not present

## 2022-08-31 DIAGNOSIS — S0003XA Contusion of scalp, initial encounter: Secondary | ICD-10-CM | POA: Diagnosis not present

## 2022-08-31 DIAGNOSIS — F17210 Nicotine dependence, cigarettes, uncomplicated: Secondary | ICD-10-CM | POA: Diagnosis not present

## 2022-08-31 DIAGNOSIS — W010XXA Fall on same level from slipping, tripping and stumbling without subsequent striking against object, initial encounter: Secondary | ICD-10-CM | POA: Insufficient documentation

## 2022-08-31 DIAGNOSIS — M25571 Pain in right ankle and joints of right foot: Secondary | ICD-10-CM | POA: Insufficient documentation

## 2022-08-31 DIAGNOSIS — W19XXXA Unspecified fall, initial encounter: Secondary | ICD-10-CM

## 2022-08-31 DIAGNOSIS — Z79899 Other long term (current) drug therapy: Secondary | ICD-10-CM | POA: Insufficient documentation

## 2022-08-31 DIAGNOSIS — J449 Chronic obstructive pulmonary disease, unspecified: Secondary | ICD-10-CM | POA: Insufficient documentation

## 2022-08-31 DIAGNOSIS — M542 Cervicalgia: Secondary | ICD-10-CM | POA: Insufficient documentation

## 2022-08-31 DIAGNOSIS — S0990XA Unspecified injury of head, initial encounter: Secondary | ICD-10-CM | POA: Diagnosis present

## 2022-08-31 DIAGNOSIS — F1994 Other psychoactive substance use, unspecified with psychoactive substance-induced mood disorder: Secondary | ICD-10-CM | POA: Diagnosis not present

## 2022-08-31 MED ORDER — GABAPENTIN 400 MG PO CAPS
400.0000 mg | ORAL_CAPSULE | Freq: Three times a day (TID) | ORAL | 0 refills | Status: AC
  Filled 2022-08-31: qty 90, 30d supply, fill #0

## 2022-08-31 MED ORDER — IBUPROFEN 800 MG PO TABS
800.0000 mg | ORAL_TABLET | Freq: Once | ORAL | Status: AC
  Administered 2022-08-31: 800 mg via ORAL
  Filled 2022-08-31: qty 1

## 2022-08-31 MED ORDER — ACETAMINOPHEN 325 MG PO TABS
650.0000 mg | ORAL_TABLET | Freq: Once | ORAL | Status: AC
  Administered 2022-08-31: 650 mg via ORAL
  Filled 2022-08-31: qty 2

## 2022-08-31 MED ORDER — NICOTINE POLACRILEX 2 MG MT GUM
2.0000 mg | CHEWING_GUM | OROMUCOSAL | 0 refills | Status: AC | PRN
  Filled 2022-08-31: qty 50, 30d supply, fill #0

## 2022-08-31 MED ORDER — NICOTINE 14 MG/24HR TD PT24
14.0000 mg | MEDICATED_PATCH | Freq: Every day | TRANSDERMAL | 0 refills | Status: AC | PRN
  Filled 2022-08-31: qty 28, 28d supply, fill #0

## 2022-08-31 NOTE — Discharge Instructions (Signed)
You were seen in the emergency department today for a fall.  Your imaging is normal.  Please use Tylenol and Motrin for pain.  You were discharged from behavioral health earlier this morning to go to the [facility].  They also provided you with some bus passes to help with transportation.  Please present there to ensure shelter resources.  Please return to emergency department for significantly worsening symptoms.

## 2022-08-31 NOTE — Discharge Summary (Signed)
Rachel Nolan to be D/C'd to [facility] for medical evaluation due to unwitnessed fall per MD order. Discussed with the patient and all questions fully answered. An After Visit Summary was printed and given to the patient. Two bus passes were attached to patient's AVS. Patient was transported via EMS. Rachel Nolan  08/31/2022 8:36 AM

## 2022-08-31 NOTE — Progress Notes (Signed)
Patient is in her room, on her bed, eyes closed.  Respirations are even and unlabored.  Patient has been up to the bathroom one time thus far this shift and reports that this is normal.

## 2022-08-31 NOTE — ED Notes (Signed)
Ace wrap placed on injured extremity

## 2022-08-31 NOTE — Progress Notes (Signed)
Report given to [facility], [facility] CN.

## 2022-08-31 NOTE — ED Notes (Signed)
Pt placed on 4l nasal cannula

## 2022-08-31 NOTE — Progress Notes (Addendum)
It was reported by night AC/RN that pt had unwitnessed fall at approximately 6am. Pt reported hitting the back of her head on the floor and "hurting" her ankle on assessment. Pt complained of tenderness on her head and headache 10/10. Pt requested to be sent to the ED. NP notified and pt was assessed. Awaiting further instruction.

## 2022-08-31 NOTE — Progress Notes (Signed)
Patient reported to staff that she fell in the shower room on water in the floor.  There was water on the floor of the shower room.  Staff reports that patient was located in a sitting position on the floor.  Breathing treatment, tylenol, and cold pak offered for comfort until day shift provider can see patient and determine if further action is necessary.

## 2022-08-31 NOTE — ED Provider Notes (Signed)
FBC/OBS ASAP Discharge Summary  Date and Time: 08/31/2022 8:45 AM  Name: Rachel Nolan  MRN:  [id number]   Discharge Diagnoses:  Final diagnoses:  Stimulant withdrawal (HCC)   08/26/2022 HPI on admission to [facility] by Dr. Jodie Echevaria, "Rachel Nolan is a 52 y.o., female with a past psychiatric history of major depressive disorder and PTSD, substance use history of stimulant use disorder and opioid use disorder, and significant PMHx of COPD  who presents to the Facility Based Crisis center from behavioral health urgent care [facility]) for evaluation and management of crack cocaine detox.   Patient says, "I'm just tired, so tired," in reference to her crack cocaine use. She says she has been using since age 36 or 67. 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 She endorses daily cigarette smoking 0.5 packs per day since age 81. She endorses remote alcohol and cannabis use but denies any other substance use currently".  Patient had previously presented to [facility] emergency department on 08/23/2022, then excepted to the [facility], then transferred to the facility base crisis unit [facility])  Patient was scheduled to be discharged this a.m. to the interactive resource center [facility]).  However patient reported a fall around 6 AM this morning while in the shower.  Per staff this fall was not witnessed.  When staff walked in patient was sitting in the floor.  At that time she requested to be sent to the emergency room.  However per staff there was no provider in the building at the time.  Subjective: Upon arrival to the unit patient is observed sitting in a wheelchair.  She is alert/oriented x 4, cooperative, and attentive.She is not disoriented or confused.  She has normal speech. She immediately complains of a headache and seeing spots.  Reports she fell in  the bathroom and has a large knot on her head.  Palpated patients head and did not feel any relevant swelling.  However she would not allow me to fully palpate.  She is also complaining of right ankle and leg pain.  States she cannot apply pressure on that extremity.  Right ankle and leg does not look swollen, or reddened.  Patient will not allow to palpate.  Patient is irritable.  Per staff patient has remained this way while on the unit.  She is homeless and I suspect aspects of secondary gain due to planned discharge today to the [facility].  She is requesting to be sent to the [facility] emergency department for evaluation.  [facility] ED Dr. Jeraldine Loots consulted and has accepted patient.  Stay Summary:   Patient has continued to deny SI/HI/AVH while on the unit.  Reports sleeping and eating well.  Patient has completed her admission to the facility base crisis unit [facility]).  Patient will be sent to the [facility] ED for medical clearance.  Once patient is medically cleared she may be discharged to the [facility].  Patient was provided with 2 bus passes and refills on all her prescriptions.  Dr. Jodie Echevaria has sent all refills to patient's pharmacy.  Patient has been verbally informed of all of this information.  In addition she is provided with an AVS with further resources.   Total Time spent with patient: 30 minutes  Past Psychiatric History: See H&P Past Medical History: See H&P Family History:  See H&P Family Psychiatric History: See H&P Social History: See H&P Tobacco Cessation:  A prescription for an FDA-approved tobacco cessation medication provided at discharge  Current Medications:  Current Facility-Administered Medications  Medication Dose Route Frequency Provider Last Rate Last Admin   acetaminophen (TYLENOL) tablet 650 mg  650 mg Oral Q6H PRN Augusto Gamble, MD   650 mg at 08/31/22 0637   albuterol (PROVENTIL) (2.5 MG/3ML) 0.083% nebulizer solution 2.5 mg  2.5 mg Inhalation Q4H Rankin, Shuvon B, NP   2.5 mg at 08/31/22  0600   alum & mag hydroxide-simeth (MAALOX/MYLANTA) 200-200-20 MG/5ML suspension 30 mL  30 mL Oral Q4H PRN Augusto Gamble, MD       amLODipine (NORVASC) tablet 10 mg  10 mg Oral Daily Rankin, Shuvon B, NP   10 mg at 08/30/22 1009   azithromycin (ZITHROMAX) tablet 250 mg  250 mg Oral Daily Rankin, Shuvon B, NP   250 mg at 08/30/22 1009   bismuth subsalicylate (PEPTO BISMOL) chewable tablet 524 mg  524 mg Oral Q3H PRN Augusto Gamble, MD       haloperidol (HALDOL) tablet 5 mg  5 mg Oral Q6H PRN Augusto Gamble, MD       And   LORazepam (ATIVAN) tablet 1 mg  1 mg Oral Q6H PRN Augusto Gamble, MD       And   diphenhydrAMINE (BENADRYL) capsule 25 mg  25 mg Oral Q6H PRN Augusto Gamble, MD       haloperidol lactate (HALDOL) injection 5 mg  5 mg Intramuscular Q6H PRN Augusto Gamble, MD       And   LORazepam (ATIVAN) injection 1 mg  1 mg Intravenous Q6H PRN Augusto Gamble, MD       And   diphenhydrAMINE (BENADRYL) injection 25 mg  25 mg Intramuscular Q6H PRN Augusto Gamble, MD       gabapentin (NEURONTIN) capsule 400 mg  400 mg Oral TID Park Pope, MD   400 mg at 08/30/22 2115   hydrOXYzine (ATARAX) tablet 25 mg  25 mg Oral TID PRN Augusto Gamble, MD       melatonin tablet 3 mg  3 mg Oral QHS PRN Augusto Gamble, MD   3 mg at 08/28/22 2201   mometasone-formoterol (DULERA) 100-5 MCG/ACT inhaler 2 puff  2 puff Inhalation BID Rankin, Shuvon B, NP   2 puff at 08/30/22 2115   montelukast (SINGULAIR) tablet 10 mg  10 mg Oral QHS Rankin, Shuvon B, NP   10 mg at 08/30/22 2115   multivitamin with minerals tablet 1 tablet  1 tablet Oral Daily Rankin, Shuvon B, NP   1 tablet at 08/30/22 1009   nicotine (NICODERM CQ - dosed in mg/24 hours) patch 14 mg  14 mg Transdermal Daily PRN Augusto Gamble, MD       nicotine polacrilex (NICORETTE) gum 2 mg  2 mg Oral PRN Augusto Gamble, MD       ondansetron [facility]) tablet 8 mg  8 mg Oral Q8H PRN Augusto Gamble, MD       polyethylene glycol (MIRALAX / GLYCOLAX) packet 17 g  17 g Oral Daily PRN Augusto Gamble, MD   17 g  at 08/26/22 2107   predniSONE (DELTASONE) tablet 40 mg  40 mg Oral Daily Rankin, Shuvon B, NP   40 mg at 08/30/22 1009   QUEtiapine (SEROQUEL) tablet 100 mg  100 mg Oral QHS Rankin, Shuvon B, NP   100 mg at 08/30/22 2116   senna (SENOKOT) tablet  8.6 mg  1 tablet Oral QHS PRN Augusto Gamble, MD       thiamine (VITAMIN B1) tablet 100 mg  100 mg Oral Daily Rankin, Shuvon B, NP   100 mg at 08/30/22 1008   Current Outpatient Medications  Medication Sig Dispense Refill   albuterol (PROVENTIL) (2.5 MG/3ML) 0.083% nebulizer solution Inhale 3 mLs (2.5 mg total) into the lungs every 4 (four) hours.     amLODipine (NORVASC) 10 MG tablet Take 1 tablet (10 mg total) by mouth daily. 30 tablet 0   azithromycin (ZITHROMAX) 250 MG tablet Take 1 tablet (250 mg total) by mouth daily. Take first 2 tablets together, then 1 every day until finished. 6 tablet 0   [START ON 09/06/2022] budesonide-formoterol (SYMBICORT) 80-4.5 MCG/ACT inhaler Inhale 2 puffs into the lungs 2 (two) times daily.     gabapentin (NEURONTIN) 400 MG capsule Take 1 capsule (400 mg total) by mouth 3 (three) times daily. 90 capsule 0   mometasone-formoterol (DULERA) 100-5 MCG/ACT AERO Inhale 2 puffs into the lungs 2 (two) times daily.     montelukast (SINGULAIR) 10 MG tablet Take 1 tablet (10 mg total) by mouth at bedtime. 30 tablet 0   nicotine (NICODERM CQ - DOSED IN MG/24 HOURS) 14 mg/24hr patch Place 1 patch (14 mg total) onto the skin daily as needed (nicotine cravings). 30 patch 0   nicotine polacrilex (NICORETTE) 2 MG gum Take 1 each (2 mg total) by mouth as needed for smoking cessation. 90 tablet 0   QUEtiapine (SEROQUEL) 100 MG tablet Take 1 tablet (100 mg total) by mouth at bedtime. 30 tablet 0    PTA Medications:  Facility Ordered Medications  Medication   albuterol (PROVENTIL) (2.5 MG/3ML) 0.083% nebulizer solution 2.5 mg   amLODipine (NORVASC) tablet 10 mg   azithromycin (ZITHROMAX) tablet 250 mg   mometasone-formoterol (DULERA)  100-5 MCG/ACT inhaler 2 puff   montelukast (SINGULAIR) tablet 10 mg   multivitamin with minerals tablet 1 tablet   predniSONE (DELTASONE) tablet 40 mg   QUEtiapine (SEROQUEL) tablet 100 mg   thiamine (VITAMIN B1) tablet 100 mg   acetaminophen (TYLENOL) tablet 650 mg   alum & mag hydroxide-simeth (MAALOX/MYLANTA) 200-200-20 MG/5ML suspension 30 mL   hydrOXYzine (ATARAX) tablet 25 mg   bismuth subsalicylate (PEPTO BISMOL) chewable tablet 524 mg   ondansetron (ZOFRAN) tablet 8 mg   polyethylene glycol (MIRALAX / GLYCOLAX) packet 17 g   senna (SENOKOT) tablet 8.6 mg   haloperidol (HALDOL) tablet 5 mg   And   LORazepam (ATIVAN) tablet 1 mg   And   diphenhydrAMINE (BENADRYL) capsule 25 mg   haloperidol lactate (HALDOL) injection 5 mg   And   LORazepam (ATIVAN) injection 1 mg   And   diphenhydrAMINE (BENADRYL) injection 25 mg   melatonin tablet 3 mg   nicotine polacrilex (NICORETTE) gum 2 mg   nicotine (NICODERM CQ - dosed in mg/24 hours) patch 14 mg   gabapentin (NEURONTIN) capsule 400 mg   PTA Medications  Medication Sig   montelukast (SINGULAIR) 10 MG tablet Take 1 tablet (10 mg total) by mouth at bedtime.   mometasone-formoterol (DULERA) 100-5 MCG/ACT AERO Inhale 2 puffs into the lungs 2 (two) times daily.   [START ON 09/06/2022] budesonide-formoterol (SYMBICORT) 80-4.5 MCG/ACT inhaler Inhale 2 puffs into the lungs 2 (two) times daily.   albuterol (PROVENTIL) (2.5 MG/3ML) 0.083% nebulizer solution Inhale 3 mLs (2.5 mg total) into the lungs every 4 (four) hours.   QUEtiapine (SEROQUEL) 100 MG  tablet Take 1 tablet (100 mg total) by mouth at bedtime.   azithromycin (ZITHROMAX) 250 MG tablet Take 1 tablet (250 mg total) by mouth daily. Take first 2 tablets together, then 1 every day until finished.   amLODipine (NORVASC) 10 MG tablet Take 1 tablet (10 mg total) by mouth daily.   nicotine (NICODERM CQ - DOSED IN MG/24 HOURS) 14 mg/24hr patch Place 1 patch (14 mg total) onto the skin daily  as needed (nicotine cravings).   nicotine polacrilex (NICORETTE) 2 MG gum Take 1 each (2 mg total) by mouth as needed for smoking cessation.   gabapentin (NEURONTIN) 400 MG capsule Take 1 capsule (400 mg total) by mouth 3 (three) times daily.       08/28/2022    2:16 PM 08/26/2022    9:01 AM 12/21/2021    4:35 PM  Depression screen PHQ 2/9  Decreased Interest 2 2 0  Down, Depressed, Hopeless 3 3 1   PHQ - 2 Score 5 5 1   Altered sleeping 2 2 0  Tired, decreased energy 3 3 1   Change in appetite 0 0 0  Feeling bad or failure about yourself  3 3 2   Trouble concentrating 0 0 0  Moving slowly or fidgety/restless 0 0 0  Suicidal thoughts 0 0 0  PHQ-9 Score 13 13 4   Difficult doing work/chores  Very difficult     Flowsheet Row ED from 08/25/2022 in [facility] Most recent reading at 08/25/2022  2:52 PM ED from 08/23/2022 in [facility] Most recent reading at 08/24/2022  1:41 AM ED from 08/23/2022 in [facility] Emergency Department at [facility] Most recent reading at 08/23/2022 12:04 PM  C-SSRS RISK CATEGORY No Risk No Risk No Risk       Musculoskeletal  Strength & Muscle Tone: within normal limits Gait & Station: unsteady Patient leans: N/A  Psychiatric Specialty Exam  Presentation  General Appearance:  Casual  Eye Contact: Good  Speech: Clear and Coherent; Normal Rate  Speech Volume: Normal  Handedness: Right   Mood and Affect  Mood: Irritable  Affect: Congruent   Thought Process  Thought Processes: Coherent  Descriptions of Associations:Intact  Orientation:Full (Time, Place and Person)  Thought Content:Logical  Diagnosis of Schizophrenia or Schizoaffective disorder in past: No data recorded   Hallucinations:Hallucinations: None  Ideas of Reference:None  Suicidal Thoughts:Suicidal Thoughts: No  Homicidal Thoughts:Homicidal Thoughts: No   Sensorium  Memory: Immediate Good; Recent Good;  Remote Good  Judgment: Good  Insight: Good   Executive Functions  Concentration: Good  Attention Span: Good  Recall: Good  Fund of Knowledge: Good  Language: Good   Psychomotor Activity  Psychomotor Activity: Psychomotor Activity: Normal   Assets  Assets: Resilience; Social Support; Physical Health   Sleep  Sleep: Sleep: Good   Nutritional Assessment (For OBS and FBC admissions only) Has the patient had a weight loss or gain of 10 pounds or more in the last 3 months?: No Has the patient had a decrease in food intake/or appetite?: No Does the patient have dental problems?: No Does the patient have eating habits or behaviors that may be indicators of an eating disorder including binging or inducing vomiting?: No Has the patient recently lost weight without trying?: 0 Has the patient been eating poorly because of a decreased appetite?: 0 Malnutrition Screening Tool Score: 0    Physical Exam  Physical Exam Vitals and nursing note reviewed.  Constitutional:  General: She is in acute distress (complains of head ache and ankle pain after a fall).     Appearance: Normal appearance. She is not ill-appearing.  HENT:     Head: Normocephalic.     Comments: Did not palpate any swollen areas on head.  Eyes:     General:        Right eye: No discharge.        Left eye: No discharge.  Cardiovascular:     Rate and Rhythm: Normal rate.  Pulmonary:     Effort: Pulmonary effort is normal.  Musculoskeletal:        General: Normal range of motion.     Cervical back: Normal range of motion.  Skin:    Coloration: Skin is not jaundiced or pale.  Neurological:     Mental Status: She is alert and oriented to person, place, and time.  Psychiatric:        Attention and Perception: Attention and perception normal.        Mood and Affect: Mood and affect normal.        Speech: Speech normal.        Behavior: Behavior is agitated.        Thought Content: Thought  content normal.        Cognition and Memory: Cognition normal.        Judgment: Judgment normal.    Review of Systems  Constitutional:  Negative for chills and fever.  HENT:  Negative for hearing loss.   Eyes:  Negative for blurred vision.  Respiratory:  Negative for cough.   Cardiovascular:  Negative for chest pain.  Musculoskeletal:  Positive for joint pain (Right ankle pain.).  Neurological:  Positive for headaches (reports seeing spots).  Psychiatric/Behavioral: Negative.     Blood pressure (!) 125/98, pulse (!) 102, temperature 98.4 F (36.9 C), resp. rate 20, last menstrual period 04/19/2018, SpO2 96%. There is no height or weight on file to calculate BMI.  Demographic Factors:  Low socioeconomic status, Living alone, and Unemployed  Loss Factors: Financial problems/change in socioeconomic status  Historical Factors: Impulsivity  Risk Reduction Factors:   Sense of responsibility to family, Positive social support, Positive therapeutic relationship, and Positive coping skills or problem solving skills  Continued Clinical Symptoms:  Depression:   Comorbid alcohol abuse/dependence Alcohol/Substance Abuse/Dependencies  Cognitive Features That Contribute To Risk:  None    Suicide Risk:  Minimal: No identifiable suicidal ideation.  Patients presenting with no risk factors but with morbid ruminations; may be classified as minimal risk based on the severity of the depressive symptoms  Plan Of Care/Follow-up recommendations:  Activity:  as tolerated  Diet:  regular  Disposition:   Discharge patient.  Transfer patient to the [facility] ED for medical clearance.  Patient has completed her admission to the [facility] and can be discharged from the emergency department to the [facility].  Patient was provided to bus passes.  Once patient is medically cleared she plans to return to the interactive resources center [facility])  Dr. Jodie Echevaria has sent patient's medications to pharmacy of choice..   Patient  has been provided resources for psychiatric medication management and therapy.  Ardis Hughs, NP 08/31/2022, 8:45 AM

## 2022-08-31 NOTE — ED Triage Notes (Addendum)
From [facility]. DC this AM after being there for 9 days. Slipped and fell in the shower. Landed onto her buttocks. Reports headache and right ankle pain. Pt reports unable to bear weight to RLE. Not on a blood thinner. No LOC.  EMS VS:  122/86 86 100% on RA

## 2022-08-31 NOTE — ED Provider Notes (Signed)
[place] [facility] AT [facility] Provider Note   CSN: [id number] Arrival date & time: 08/31/22  0847     History  Chief Complaint  Patient presents with   Ankle Pain   Fall    Rachel Nolan is a 52 y.o. female.  With past medical history of anxiety and depression with psychotic features, substance-induced mood disorder, hypertension, COPD, polysubstance abuse who presents with fall.  States this morning around 6 AM she was getting into the shower when she slipped and fell.  States that she slipped on the water, falling backward and striking her head.  Landed on her bottom.  She states that her right ankle is hurting as well as the back of her head.  She denies having any changes to her vision.  She denies prodromal symptoms of fall such as lightheadedness, chest pain or headache.  Not anticoagulated.  Did not lose consciousness.   Ankle Pain Associated symptoms: neck pain   Fall Associated symptoms include headaches.       Home Medications Prior to Admission medications   Medication Sig Start Date End Date Taking? Authorizing Provider  albuterol (PROVENTIL) (2.5 MG/3ML) 0.083% nebulizer solution Inhale 3 mLs (2.5 mg total) into the lungs every 4 (four) hours. 08/07/22   Morene Crocker, MD  amLODipine (NORVASC) 10 MG tablet Take 1 tablet (10 mg total) by mouth daily. 08/25/22 09/24/22  Rankin, Shuvon B, NP  azithromycin (ZITHROMAX) 250 MG tablet Take 1 tablet (250 mg total) by mouth daily. Take first 2 tablets together, then 1 every day until finished. 08/23/22   Dakermandji, Luther Parody, DO  budesonide-formoterol (SYMBICORT) 80-4.5 MCG/ACT inhaler Inhale 2 puffs into the lungs 2 (two) times daily. 09/06/22   Morene Crocker, MD  gabapentin (NEURONTIN) 400 MG capsule Take 1 capsule (400 mg total) by mouth 3 (three) times daily. 08/31/22 09/30/22  Augusto Gamble, MD  mometasone-formoterol (DULERA) 100-5 MCG/ACT AERO Inhale 2 puffs into the lungs 2 (two)  times daily. 08/07/22   Morene Crocker, MD  montelukast (SINGULAIR) 10 MG tablet Take 1 tablet (10 mg total) by mouth at bedtime. 05/26/22   Karie Fetch, MD  nicotine (NICODERM CQ - DOSED IN MG/24 HOURS) 14 mg/24hr patch Place 1 patch (14 mg total) onto the skin daily as needed (nicotine cravings). 08/31/22 09/30/22  Augusto Gamble, MD  nicotine polacrilex (NICORETTE) 2 MG gum Take 1 each (2 mg total) by mouth as needed for smoking cessation. 08/31/22 09/30/22  Augusto Gamble, MD  QUEtiapine (SEROQUEL) 100 MG tablet Take 1 tablet (100 mg total) by mouth at bedtime. 08/07/22 09/06/22  Morene Crocker, MD  citalopram (CELEXA) 20 MG tablet Take 1 tablet (20 mg total) by mouth daily. 03/07/19 07/23/19  Aldean Baker, NP  lisinopril (ZESTRIL) 10 MG tablet Take 1 tablet (10 mg total) by mouth daily. 03/07/19 09/11/19  Aldean Baker, NP  pantoprazole (PROTONIX) 40 MG tablet Take 1 tablet (40 mg total) by mouth daily. 03/07/19 07/23/19  Aldean Baker, NP      Allergies    Patient has no known allergies.    Review of Systems   Review of Systems  Musculoskeletal:  Positive for arthralgias and neck pain.  Neurological:  Positive for headaches.  All other systems reviewed and are negative.   Physical Exam Updated Vital Signs BP 135/85 (BP Location: Right Arm)   Pulse 98   Temp 98.5 F (36.9 C) (Oral)   Resp 15   Ht 5\' 6"  (1.676 m)  Wt 86.2 kg   LMP 04/19/2018   SpO2 100%   BMI 30.67 kg/m  Physical Exam Vitals and nursing note reviewed.  Constitutional:      General: She is not in acute distress.    Appearance: Normal appearance. She is not ill-appearing or toxic-appearing.     Comments: Somewhat disheveled appearing  HENT:     Head: Normocephalic.     Comments: Mild contusion to the right parieto-occipital scalp Eyes:     General: No scleral icterus.    Extraocular Movements: Extraocular movements intact.     Pupils: Pupils are equal, round, and reactive to light.  Pulmonary:      Effort: Pulmonary effort is normal. No respiratory distress.  Musculoskeletal:     Cervical back: Normal range of motion. Tenderness present. Spinous process tenderness and muscular tenderness present. Normal range of motion.     Comments: Pelvis is stable  Tenderness to palpation of the right ankle.  No obvious ecchymosis, swelling or erythema.  DP pulse 2+.  She does have decreased range of motion secondary to pain.  No pain to the right knee or hip.  Skin:    Findings: No rash.  Neurological:     General: No focal deficit present.     Mental Status: She is alert.  Psychiatric:        Mood and Affect: Mood normal.        Behavior: Behavior normal.        Thought Content: Thought content normal.        Judgment: Judgment normal.     ED Results / Procedures / Treatments   Labs (all labs ordered are listed, but only abnormal results are displayed) Labs Reviewed - No data to display  EKG None  Radiology CT Head Wo Contrast  Result Date: 08/31/2022 CLINICAL DATA:  Head and neck trauma EXAM: CT HEAD WITHOUT CONTRAST CT CERVICAL SPINE WITHOUT CONTRAST TECHNIQUE: Multidetector CT imaging of the head and cervical spine was performed following the standard protocol without intravenous contrast. Multiplanar CT image reconstructions of the cervical spine were also generated. RADIATION DOSE REDUCTION: This exam was performed according to the departmental dose-optimization program which includes automated exposure control, adjustment of the mA and/or kV according to patient size and/or use of iterative reconstruction technique. COMPARISON:  None Available. FINDINGS: CT HEAD FINDINGS Brain: No evidence of acute infarction, hemorrhage, hydrocephalus, extra-axial collection or mass lesion/mass effect. Vascular: No hyperdense vessel or unexpected calcification. Skull: Normal. Negative for fracture or focal lesion. Sinuses/Orbits: No acute finding. Other: None. CT CERVICAL SPINE FINDINGS Alignment:  Degenerative straightening of the normal cervical lordosis. Skull base and vertebrae: No acute fracture. No primary bone lesion or focal pathologic process. Soft tissues and spinal canal: No prevertebral fluid or swelling. No visible canal hematoma. Disc levels: Moderate disc space height loss and osteophytosis from C4-C7. Upper chest: Emphysema. Other: None. IMPRESSION: 1. No acute intracranial pathology. 2. No fracture or static subluxation of the cervical spine. 3. Moderate disc degenerative disease from C4-C7. 4. Emphysema. Emphysema (ICD10-J43.9). Electronically Signed   By: Jearld Lesch M.D.   On: 08/31/2022 12:14   CT Cervical Spine Wo Contrast  Result Date: 08/31/2022 CLINICAL DATA:  Head and neck trauma EXAM: CT HEAD WITHOUT CONTRAST CT CERVICAL SPINE WITHOUT CONTRAST TECHNIQUE: Multidetector CT imaging of the head and cervical spine was performed following the standard protocol without intravenous contrast. Multiplanar CT image reconstructions of the cervical spine were also generated. RADIATION DOSE REDUCTION: This exam was  performed according to the departmental dose-optimization program which includes automated exposure control, adjustment of the mA and/or kV according to patient size and/or use of iterative reconstruction technique. COMPARISON:  None Available. FINDINGS: CT HEAD FINDINGS Brain: No evidence of acute infarction, hemorrhage, hydrocephalus, extra-axial collection or mass lesion/mass effect. Vascular: No hyperdense vessel or unexpected calcification. Skull: Normal. Negative for fracture or focal lesion. Sinuses/Orbits: No acute finding. Other: None. CT CERVICAL SPINE FINDINGS Alignment: Degenerative straightening of the normal cervical lordosis. Skull base and vertebrae: No acute fracture. No primary bone lesion or focal pathologic process. Soft tissues and spinal canal: No prevertebral fluid or swelling. No visible canal hematoma. Disc levels: Moderate disc space height loss and  osteophytosis from C4-C7. Upper chest: Emphysema. Other: None. IMPRESSION: 1. No acute intracranial pathology. 2. No fracture or static subluxation of the cervical spine. 3. Moderate disc degenerative disease from C4-C7. 4. Emphysema. Emphysema (ICD10-J43.9). Electronically Signed   By: Jearld Lesch M.D.   On: 08/31/2022 12:14   DG Ankle Complete Right  Result Date: 08/31/2022 CLINICAL DATA:  Fall.  Pain EXAM: RIGHT ANKLE - COMPLETE 3 VIEW COMPARISON:  None Available. FINDINGS: Small well corticated plantar calcaneal spur. No fracture or dislocation. Preserved joint spaces and bone mineralization. IMPRESSION: No acute osseous abnormality Electronically Signed   By: Karen Kays M.D.   On: 08/31/2022 10:51    Procedures Procedures   Medications Ordered in ED Medications  acetaminophen (TYLENOL) tablet 650 mg (650 mg Oral Given 08/31/22 0948)  ibuprofen (ADVIL) tablet 800 mg (800 mg Oral Given 08/31/22 9147)    ED Course/ Medical Decision Making/ A&P   {    Medical Decision Making Amount and/or Complexity of Data Reviewed Radiology: ordered.  Risk OTC drugs. Prescription drug management.  Initial Impression and Ddx 52 year old female who presents to the emergency department with fall. Patient PMH that increases complexity of ED encounter: Anxiety, depression with psychotic features, polysubstance abuse  Interpretation of Diagnostics I independent reviewed and interpreted the labs as followed: Not indicated  - I independently visualized the following imaging with scope of interpretation limited to determining acute life threatening conditions related to emergency care: CT head, CT C-spine, right ankle plain film, which revealed no acute findings  Patient Reassessment and Ultimate Disposition/Management 52 year old female who presents to the emergency department with fall.  On my exam, right ankle tenderness and decreased range of motion.  She does have a small contusion to the right  parieto-occipital scalp complaining of headache after fall.  Will order CT head and C-spine as well as plain film of the right ankle.  Will give Tylenol and Motrin and reassess.  Plain film of the right ankle shows no acute findings. CT head and C-spine without any acute findings.  Do not feel the patient needs further workup at this time.  Mechanical fall.  There is no prodromal symptoms to her fall.  Doubt that there were syncope, stroke, arrhythmia, etc. or other cause for fall.  Patient is stable.  I reviewed notes from behavioral health.  Patient was discharged this morning.  Further notes after she was sent to the ED could be [facility] to [facility].  They provided her with a bus pass for this.  Will discharge her.  The patient has been appropriately medically screened and/or stabilized in the ED. I have low suspicion for any other emergent medical condition which would require further screening, evaluation or treatment in the ED or require inpatient management. At time of discharge the patient  is hemodynamically stable and in no acute distress. I have discussed work-up results and diagnosis with patient and answered all questions. Patient is agreeable with discharge plan. We discussed strict return precautions for returning to the emergency department and they verbalized understanding.     Patient management required discussion with the following services or consulting groups:  None  Complexity of Problems Addressed Acute complicated illness or Injury  Additional Data Reviewed and Analyzed Further history obtained from: Past medical history and medications listed in the EMR and Prior ED visit notes  Patient Encounter Risk Assessment SDOH impact on management  Final Clinical Impression(s) / ED Diagnoses Final diagnoses:  Fall, initial encounter    Rx / DC Orders ED Discharge Orders     None         Cristopher Peru, PA-C 08/31/22 1226    Long, Arlyss Repress, MD 09/03/22 [phone]

## 2022-09-01 NOTE — ED Notes (Signed)
   08/31/22 0625  What Happened  Was fall witnessed? No  Was patient injured? Unsure  Patient found in bathroom;on floor  Found by Staff-comment (MHT reported pt on floor in bathroom - reporting the floor was wet.  Not sure why patient was using shower room when she was reporting to be going to the restroom.)  Provider Notification  Provider Name/Title Augusto Gamble MD  Date Provider Notified 08/31/22  Time Provider Notified [phone]  Method of Notification  (Secure chat)  Notification Reason Fall  Provider response En route  Date of Provider Response 08/31/22  Time of Provider Response 0730  Follow Up  Additional tests No  Simple treatment Ice  Progress note created (see row info)  (Patient reported to staff that she slipped in water and c/o pain to R ankle.  No visable signs of swelling, however patient reports tender to touch.)  Sharlett Iles Fall Risk Assessment  Kinder Fall Risk Assessment Impaired mobility;Nursing judgement of fall risk  Fall Risk Score High fall risk  Adult Fall Risk Interventions  Required Bundle Interventions *See Row Information* High risk bundle implemented  Vitals  Temp 98.4 F (36.9 C)  Temp Source Oral  BP (!) 158/92  BP Location Right Arm  BP Method Automatic  Patient Position (if appropriate) Sitting  Pulse Rate (!) 102  Pulse Rate Source Dinamap  Resp 20  Oxygen Therapy  SpO2 96 %  O2 Device Room Air  Patient Activity (if Appropriate) In chair  Pain Assessment  Pain Scale 0-10  Pain Score 8  Pain Type Acute pain  Pain Location Ankle  Pain Orientation Right  Pain Descriptors / Indicators Sharp  Pain Frequency Constant  Pain Onset Sudden  Patients Stated Pain Goal 4  Pain Intervention(s) Hot/Cold interventions;Medication (See eMAR);Relaxation;Emotional support;MD notified (Comment)  Multiple Pain Sites No  Hot/Cold Interventions  Hot/Cold Interventions Ice Pack  Neurological  Neuro (WDL) WDL  Level of Consciousness Alert  Musculoskeletal   Musculoskeletal (WDL) WDL  Assistive Device Other (Comment) (4 point walker)  Weight Bearing Restrictions No  Integumentary  Integumentary (WDL) WDL  Skin Color Appropriate for ethnicity  Skin Integrity Intact  Scratch Marks Location N/A

## 2022-09-12 ENCOUNTER — Encounter (HOSPITAL_COMMUNITY): Payer: Self-pay

## 2022-09-12 ENCOUNTER — Other Ambulatory Visit: Payer: Self-pay

## 2022-09-12 ENCOUNTER — Emergency Department (HOSPITAL_COMMUNITY): Payer: No Typology Code available for payment source

## 2022-09-12 ENCOUNTER — Inpatient Hospital Stay (HOSPITAL_COMMUNITY)
Admission: EM | Admit: 2022-09-12 | Discharge: 2022-09-17 | DRG: 190 | Disposition: A | Discharge status: Home / Self Care | Payer: No Typology Code available for payment source | Attending: Internal Medicine | Admitting: Internal Medicine

## 2022-09-12 DIAGNOSIS — G629 Polyneuropathy, unspecified: Secondary | ICD-10-CM | POA: Diagnosis present

## 2022-09-12 DIAGNOSIS — E669 Obesity, unspecified: Secondary | ICD-10-CM | POA: Diagnosis present

## 2022-09-12 DIAGNOSIS — Z6833 Body mass index (BMI) 33.0-33.9, adult: Secondary | ICD-10-CM

## 2022-09-12 DIAGNOSIS — I10 Essential (primary) hypertension: Secondary | ICD-10-CM | POA: Diagnosis present

## 2022-09-12 DIAGNOSIS — Z66 Do not resuscitate: Secondary | ICD-10-CM | POA: Diagnosis present

## 2022-09-12 DIAGNOSIS — F1290 Cannabis use, unspecified, uncomplicated: Secondary | ICD-10-CM | POA: Diagnosis present

## 2022-09-12 DIAGNOSIS — Z8249 Family history of ischemic heart disease and other diseases of the circulatory system: Secondary | ICD-10-CM

## 2022-09-12 DIAGNOSIS — F32A Depression, unspecified: Secondary | ICD-10-CM | POA: Diagnosis present

## 2022-09-12 DIAGNOSIS — F17210 Nicotine dependence, cigarettes, uncomplicated: Secondary | ICD-10-CM | POA: Diagnosis present

## 2022-09-12 DIAGNOSIS — J441 Chronic obstructive pulmonary disease with (acute) exacerbation: Secondary | ICD-10-CM | POA: Diagnosis not present

## 2022-09-12 DIAGNOSIS — Z1152 Encounter for screening for COVID-19: Secondary | ICD-10-CM

## 2022-09-12 DIAGNOSIS — J9621 Acute and chronic respiratory failure with hypoxia: Secondary | ICD-10-CM | POA: Diagnosis present

## 2022-09-12 DIAGNOSIS — F419 Anxiety disorder, unspecified: Secondary | ICD-10-CM | POA: Diagnosis present

## 2022-09-12 DIAGNOSIS — Z5901 Sheltered homelessness: Secondary | ICD-10-CM

## 2022-09-12 DIAGNOSIS — J9600 Acute respiratory failure, unspecified whether with hypoxia or hypercapnia: Secondary | ICD-10-CM

## 2022-09-12 DIAGNOSIS — F1490 Cocaine use, unspecified, uncomplicated: Secondary | ICD-10-CM | POA: Diagnosis present

## 2022-09-12 DIAGNOSIS — Z79899 Other long term (current) drug therapy: Secondary | ICD-10-CM

## 2022-09-12 DIAGNOSIS — F1910 Other psychoactive substance abuse, uncomplicated: Secondary | ICD-10-CM | POA: Diagnosis present

## 2022-09-12 DIAGNOSIS — H409 Unspecified glaucoma: Secondary | ICD-10-CM | POA: Diagnosis present

## 2022-09-12 DIAGNOSIS — Z91148 Patient's other noncompliance with medication regimen for other reason: Secondary | ICD-10-CM

## 2022-09-12 DIAGNOSIS — Z7951 Long term (current) use of inhaled steroids: Secondary | ICD-10-CM

## 2022-09-12 DIAGNOSIS — E876 Hypokalemia: Secondary | ICD-10-CM | POA: Diagnosis present

## 2022-09-12 LAB — I-STAT VENOUS BLOOD GAS, ED
Acid-Base Excess: 1 mmol/L (ref 0.0–2.0)
Bicarbonate: 26.6 mmol/L (ref 20.0–28.0)
Calcium, Ion: 1.18 mmol/L (ref 1.15–1.40)
HCT: 35 % — ABNORMAL LOW (ref 36.0–46.0)
Hemoglobin: 11.9 g/dL — ABNORMAL LOW (ref 12.0–15.0)
O2 Saturation: 97 %
Potassium: 3.6 mmol/L (ref 3.5–5.1)
Sodium: 140 mmol/L (ref 135–145)
TCO2: 28 mmol/L (ref 22–32)
pCO2, Ven: 45.6 mmHg (ref 44–60)
pH, Ven: 7.374 (ref 7.25–7.43)
pO2, Ven: 92 mmHg — ABNORMAL HIGH (ref 32–45)

## 2022-09-12 LAB — BASIC METABOLIC PANEL
Anion gap: 7 (ref 5–15)
Anion gap: 10 (ref 5–15)
BUN: 10 mg/dL (ref 6–20)
BUN: 11 mg/dL (ref 6–20)
CO2: 21 mmol/L — ABNORMAL LOW (ref 22–32)
CO2: 24 mmol/L (ref 22–32)
Calcium: 8 mg/dL — ABNORMAL LOW (ref 8.9–10.3)
Calcium: 8.9 mg/dL (ref 8.9–10.3)
Chloride: 108 mmol/L (ref 98–111)
Chloride: 104 mmol/L (ref 98–111)
Creatinine, Ser: 0.59 mg/dL (ref 0.44–1.00)
Creatinine, Ser: 0.75 mg/dL (ref 0.44–1.00)
GFR, Estimated: >60 mL/min (ref >60)
GFR, Estimated: >60 mL/min (ref >60)
Glucose, Bld: 104 mg/dL — ABNORMAL HIGH (ref 70–99)
Glucose, Bld: 201 mg/dL — ABNORMAL HIGH (ref 70–99)
Potassium: 3.3 mmol/L — ABNORMAL LOW (ref 3.5–5.1)
Potassium: 3.6 mmol/L (ref 3.5–5.1)
Sodium: 136 mmol/L (ref 135–145)
Sodium: 138 mmol/L (ref 135–145)

## 2022-09-12 LAB — CBC
HCT: 35.2 % — ABNORMAL LOW (ref 36.0–46.0)
Hemoglobin: 11.6 g/dL — ABNORMAL LOW (ref 12.0–15.0)
MCH: 31.4 pg (ref 26.0–34.0)
MCHC: 33 g/dL (ref 30.0–36.0)
MCV: 95.4 fL (ref 80.0–100.0)
Platelets: 189 10*3/uL (ref 150–400)
RBC: 3.69 MIL/uL — ABNORMAL LOW (ref 3.87–5.11)
RDW: 15.9 % — ABNORMAL HIGH (ref 11.5–15.5)
WBC: 4.2 10*3/uL (ref 4.0–10.5)
nRBC: 0 % (ref 0.0–0.2)

## 2022-09-12 LAB — RESP PANEL BY RT-PCR (RSV, FLU A&B, COVID)  RVPGX2
Influenza A by PCR: NEGATIVE
Influenza B by PCR: NEGATIVE
Resp Syncytial Virus by PCR: NEGATIVE
SARS Coronavirus 2 by RT PCR: NEGATIVE

## 2022-09-12 MED ORDER — POTASSIUM CHLORIDE CRYS ER 20 MEQ PO TBCR
40.0000 meq | EXTENDED_RELEASE_TABLET | Freq: Once | ORAL | Status: AC
  Administered 2022-09-12: 40 meq via ORAL
  Filled 2022-09-12: qty 2

## 2022-09-12 MED ORDER — PREDNISONE 20 MG PO TABS
40.0000 mg | ORAL_TABLET | Freq: Every day | ORAL | Status: AC
  Administered 2022-09-12 – 2022-09-16 (×5): 40 mg via ORAL
  Filled 2022-09-12 (×5): qty 2

## 2022-09-12 MED ORDER — MONTELUKAST SODIUM 10 MG PO TABS
10.0000 mg | ORAL_TABLET | Freq: Every day | ORAL | Status: DC
  Administered 2022-09-12 – 2022-09-16 (×5): 10 mg via ORAL
  Filled 2022-09-12 (×5): qty 1

## 2022-09-12 MED ORDER — RIVAROXABAN 10 MG PO TABS
10.0000 mg | ORAL_TABLET | Freq: Every day | ORAL | Status: DC
  Administered 2022-09-12 – 2022-09-17 (×6): 10 mg via ORAL
  Filled 2022-09-12 (×6): qty 1

## 2022-09-12 MED ORDER — ACETAMINOPHEN 325 MG PO TABS
ORAL_TABLET | ORAL | Status: AC
  Filled 2022-09-12: qty 2

## 2022-09-12 MED ORDER — IPRATROPIUM-ALBUTEROL 0.5-2.5 (3) MG/3ML IN SOLN
3.0000 mL | RESPIRATORY_TRACT | Status: DC | PRN
  Administered 2022-09-13 – 2022-09-15 (×3): 3 mL via RESPIRATORY_TRACT
  Filled 2022-09-12 (×3): qty 3

## 2022-09-12 MED ORDER — GABAPENTIN 400 MG PO CAPS: 400.0000 mg | ORAL_CAPSULE | Freq: Three times a day (TID) | ORAL | Status: DC

## 2022-09-12 MED ORDER — SODIUM CHLORIDE 0.9 % IV SOLN
1.0000 g | Freq: Every day | INTRAVENOUS | Status: DC
  Administered 2022-09-12: 1 g via INTRAVENOUS
  Filled 2022-09-12: qty 10

## 2022-09-12 MED ORDER — ACETAMINOPHEN 500 MG PO TABS
1000.0000 mg | ORAL_TABLET | Freq: Three times a day (TID) | ORAL | Status: DC
  Administered 2022-09-12 – 2022-09-17 (×13): 1000 mg via ORAL
  Filled 2022-09-12 (×14): qty 2

## 2022-09-12 MED ORDER — MOMETASONE FURO-FORMOTEROL FUM 100-5 MCG/ACT IN AERO
2.0000 | INHALATION_SPRAY | Freq: Two times a day (BID) | RESPIRATORY_TRACT | Status: DC
  Administered 2022-09-12 – 2022-09-17 (×11): 2 via RESPIRATORY_TRACT
  Filled 2022-09-12: qty 8.8

## 2022-09-12 MED ORDER — UMECLIDINIUM BROMIDE 62.5 MCG/ACT IN AEPB
1.0000 | INHALATION_SPRAY | Freq: Every day | RESPIRATORY_TRACT | Status: DC
  Administered 2022-09-12 – 2022-09-17 (×6): 1 via RESPIRATORY_TRACT
  Filled 2022-09-12: qty 7

## 2022-09-12 MED ORDER — GUAIFENESIN-DM 100-10 MG/5ML PO SYRP
5.0000 mL | ORAL_SOLUTION | Freq: Once | ORAL | Status: AC
  Administered 2022-09-12: 5 mL via ORAL
  Filled 2022-09-12: qty 10

## 2022-09-12 MED ORDER — GABAPENTIN 300 MG PO CAPS
300.0000 mg | ORAL_CAPSULE | Freq: Three times a day (TID) | ORAL | Status: DC
  Administered 2022-09-12 – 2022-09-17 (×16): 300 mg via ORAL
  Filled 2022-09-12 (×15): qty 1

## 2022-09-12 NOTE — ED Notes (Signed)
ED TO INPATIENT HANDOFF REPORT  ED Nurse Name and Phone #: [phone]  S Name/Age/Gender Rachel Nolan 52 y.o. female Room/Bed: 017C/017C  Code Status   Code Status: DNR  Home/SNF/Other Homeless Patient oriented to: self, place, time, and situation Is this baseline? Yes   Triage Complete: Triage complete  Chief Complaint COPD exacerbation (HCC) [J44.1]  Triage Note Patient brought in via EMS, HX of COPD, Per ems patient had audible wheezing, O2 94% RA. Patient tripoding/coughing at this time.    Allergies No Known Allergies  Level of Care/Admitting Diagnosis ED Disposition     ED Disposition  Admit   Condition  --   Comment  Hospital Area: [facility] [[id number]]  Level of Care: Telemetry Medical [104]  May place patient in observation at [facility] or [facility] if equivalent level of care is available:: Yes  Covid Evaluation: Symptomatic Person Under Investigation (PUI) or recent exposure (last 10 days) *Testing Required*  Diagnosis: COPD exacerbation (HCC) [696295]  Admitting Physician: Silvio Pate  Attending Physician: Carlynn Purl C [[id number]]          B Medical/Surgery History Past Medical History:  Diagnosis Date   Anxiety    Asthma    COPD (chronic obstructive pulmonary disease) (HCC)    Depression    Hypertension    Scoliosis    Past Surgical History:  Procedure Laterality Date   BACK SURGERY     ECTOPIC PREGNANCY SURGERY       A IV Location/Drains/Wounds Patient Lines/Drains/Airways Status     Active Line/Drains/Airways     Name Placement date Placement time Site Days   Peripheral IV 09/12/22 20 G Distal;Left;Posterior Forearm 09/12/22  0500  Forearm  less than 1            Intake/Output Last 24 hours No intake or output data in the 24 hours ending 09/12/22 0501  Labs/Imaging Results for orders placed or performed during the hospital encounter of 09/12/22 (from the past 48 hour(s))  CBC      Status: Abnormal   Collection Time: 09/12/22  3:03 AM  Result Value Ref Range   WBC 4.2 4.0 - 10.5 K/uL   RBC 3.69 (L) 3.87 - 5.11 MIL/uL   Hemoglobin 11.6 (L) 12.0 - 15.0 g/dL   HCT 28.4 (L) 13.2 - 44.0 %   MCV 95.4 80.0 - 100.0 fL   MCH 31.4 26.0 - 34.0 pg   MCHC 33.0 30.0 - 36.0 g/dL   RDW 10.2 (H) 72.5 - 36.6 %   Platelets 189 150 - 400 K/uL   nRBC 0.0 0.0 - 0.2 %    Comment: Performed at [facility], [address], [place], [place] [address]  Basic metabolic panel     Status: Abnormal   Collection Time: 09/12/22  4:07 AM  Result Value Ref Range   Sodium 136 135 - 145 mmol/L   Potassium 3.3 (L) 3.5 - 5.1 mmol/L   Chloride 108 98 - 111 mmol/L   CO2 21 (L) 22 - 32 mmol/L   Glucose, Bld 104 (H) 70 - 99 mg/dL    Comment: Glucose reference range applies only to samples taken after fasting for at least 8 hours.   BUN 10 6 - 20 mg/dL   Creatinine, Ser 7.42 0.44 - 1.00 mg/dL   Calcium 8.0 (L) 8.9 - 10.3 mg/dL   GFR, Estimated >59 >56 mL/min    Comment: (NOTE) Calculated using the CKD-EPI Creatinine Equation (2021)  Anion gap 7 5 - 15    Comment: Performed at [facility], [address], [place], [place] [address]   DG Chest Port 1 View  Result Date: 09/12/2022 CLINICAL DATA:  Shortness of breath EXAM: PORTABLE CHEST 1 VIEW COMPARISON:  08/23/2018 FINDINGS: Cardiac shadow is stable. Postsurgical changes in the thoracolumbar spine are again seen. No focal infiltrate or effusion is noted. No acute bony abnormality is seen. IMPRESSION: No acute abnormality noted. Electronically Signed   By: Alcide Clever M.D.   On: 09/12/2022 02:59    Pending Labs Unresulted Labs (From admission, onward)     Start     Ordered   09/12/22 0415  Blood gas, venous  Once,   R        09/12/22 0414   09/12/22 0352  Basic metabolic panel  Once,   STAT        09/12/22 0351   09/12/22 0227  Resp panel by RT-PCR (RSV, Flu A&B, Covid) Anterior Nasal Swab  (Asymptomatic - Covid)  Once,    URGENT        09/12/22 0227            Vitals/Pain Today's Vitals   09/12/22 0234 09/12/22 0243 09/12/22 0245 09/12/22 0253  BP: (!) 152/115  127/76 (!) 120/104  Pulse: 98  69 92  Resp: 16  20 (!) 32  Temp:  97.7 F (36.5 C) 97.8 F (36.6 C)   TempSrc:  Oral Oral   SpO2: 100%  98% 100%    Isolation Precautions No active isolations  Medications Medications  rivaroxaban (XARELTO) tablet 10 mg (has no administration in time range)    Mobility walks with device     Focused Assessments COPD- Respiratory distress   R Recommendations: See Admitting Provider Note  Report given to:   Additional Notes: Patient on BiPaP

## 2022-09-12 NOTE — Plan of Care (Signed)

## 2022-09-12 NOTE — Progress Notes (Signed)
   09/12/22 0234  BiPAP/CPAP/SIPAP  $ Non-Invasive Ventilator  Non-Invasive Vent Set Up;Non-Invasive Vent Initial  $ Face Mask Medium Yes  BiPAP/CPAP/SIPAP Pt Type Adult  BiPAP/CPAP/SIPAP V60  Mask Type Full face mask  Mask Size Medium  Set Rate 16 breaths/min  Respiratory Rate 27 breaths/min  IPAP 10 cmH20  EPAP 6 cmH2O  FiO2 (%) 40 %  Minute Ventilation 14.2  Leak 80  Peak Inspiratory Pressure (PIP) 10  Tidal Volume (Vt) 497  Patient Home Equipment No  Auto Titrate No  Press High Alarm 30 cmH2O  Press Low Alarm 5 cmH2O  CPAP/SIPAP surface wiped down Yes  BiPAP/CPAP /SiPAP Vitals  Bilateral Breath Sounds Expiratory wheezes

## 2022-09-12 NOTE — H&P (Signed)
Date: 09/12/2022               Patient Name:  Rachel Nolan MRN: [id number]  DOB: 1970/05/18 Age / Sex: 52 y.o., female   PCP: Morene Crocker, MD         Medical Service: Internal Medicine Teaching Service         Attending Physician: Dr. Gust Rung, DO      First Contact: Dr. Laretta Bolster, MD Pager [phone]    Second Contact: Dr. Olegario Messier, MD Pager [phone]         After Hours (After 5p/  First Contact Pager: [phone]  weekends / holidays): Second Contact Pager: [phone]   SUBJECTIVE   Chief Complaint: Dyspnea  History of Present Illness:  Rachel Nolan is a 52 y/o female with PMH of COPD, HTN, and substance use disorder presenting with acute dyspnea and chest tightness after a week of productive cough with dark sputum. Last night she felt her chest get tight and had trouble breathing consistent with prior COPD exacerbations, with her last one being one month ago. She also has had dark sputum for the past week but without any significant increase in sputum production. She also noted some bloody output with her cough but not mixed into her sputum. Since discharge last month she has been on 5 L of O2 through [place].  She has orthopnea and usually sleeps upright for best comfort but this had not significantly changed recently. She had one episode of mild orthostatic dizziness two days ago without LOC or fall. She has not been taking all of her medications due to then being left in the bathroom and fear of someone tampering with them. She has been adherent with her inhalers. She denies any sick contacts, fevers, chills, exertional chest pain, syncope, lower extremity edema, nausea, vomiting, bowel/bladder changes.  ED Course: Presented tachypneic and tripoding but afebrile. Initial CBC, EKG, and CXR were unremarkable and stable. She was placed on bipap with symptomatic improvement. IMTS paged for admission.  Past Medical History COPD HTN Depression/Anxiety Substance  Use disorder  Meds:  Albuterol PRN Amlodipine 10 mg daily Symbicort 80-4.5 mcg 2 puffs BID Dulera 100-5 mcg 2 puffs BID Incruse Ellipta 62.5 mcg 1 puff daily Montelukast 10 mg daily Quetiapine 100 mg at bedtime Gabapentin 300 mg TID  Past Surgical History Past Surgical History:  Procedure Laterality Date   BACK SURGERY     ECTOPIC PREGNANCY SURGERY      Social:  Lives in a shelter, previously living with sister.  Level of Function: Independent PCP: Morene Crocker, MD Substances: Current smoker, 1 pack per week. Prior heroin use. Current marijuana and crack cocaine use, unknown last ingestion. Occasional alcohol.  Family History:  Family History  Problem Relation Age of Onset   Hypertension Mother    Hypertension Father      Allergies: Allergies as of 09/12/2022   (No Known Allergies)    Review of Systems: A complete ROS was negative except as per HPI.   OBJECTIVE:   Physical Exam: Blood pressure (!) 120/104, pulse 92, temperature 97.8 F (36.6 C), temperature source Oral, resp. rate (!) 32, last menstrual period 04/19/2018, SpO2 100%.  Constitutional: Tired appearing female on BiPAP. In no acute distress. HENT: Normocephalic, atraumatic,  Eyes: Sclera non-icteric, PERRL, EOM intact Neck: No significant JVD Cardio:Regular rate and rhythm. No murmurs, rubs, or gallops. 2+ bilateral radial pulses. Pulm: Significant expiratory wheezing throughout lung fields Abdomen: Soft, mild mid abdominal tenderness to palpation,  non-distended, positive bowel sounds. NGE:[id number] for extremity edema. Skin:Warm and dry. Neuro:Alert and oriented x3. No focal deficit noted. Psych:Pleasant mood and affect.  Labs: CBC    Component Value Date/Time   WBC 4.2 09/12/2022 0303   RBC 3.69 (L) 09/12/2022 0303   HGB 11.9 (L) 09/12/2022 0533   HCT 35.0 (L) 09/12/2022 0533   PLT 189 09/12/2022 0303   MCV 95.4 09/12/2022 0303   MCH 31.4 09/12/2022 0303   MCHC 33.0 09/12/2022  0303   RDW 15.9 (H) 09/12/2022 0303   LYMPHSABS 1.7 08/04/2022 2156   MONOABS 0.5 08/04/2022 2156   EOSABS 0.2 08/04/2022 2156   BASOSABS 0.0 08/04/2022 2156     CMP     Component Value Date/Time   NA 140 09/12/2022 0533   K 3.6 09/12/2022 0533   CL 108 09/12/2022 0407   CO2 21 (L) 09/12/2022 0407   GLUCOSE 104 (H) 09/12/2022 0407   BUN 10 09/12/2022 0407   CREATININE 0.59 09/12/2022 0407   CALCIUM 8.0 (L) 09/12/2022 0407   PROT 6.6 08/23/2022 1205   ALBUMIN 3.5 08/23/2022 1205   AST 15 08/23/2022 1205   ALT 9 08/23/2022 1205   ALKPHOS 68 08/23/2022 1205   BILITOT 0.6 08/23/2022 1205   GFRNONAA >60 09/12/2022 0407   GFRAA >60 08/28/2019 2026    Imaging: DG Chest Port 1 View Result Date: 09/12/2022 IMPRESSION: No acute abnormality noted. Electronically Signed   By: Alcide Clever M.D.   On: 09/12/2022 02:59     EKG: personally reviewed my interpretation is NSR. Consistent with prior EKG.  ASSESSMENT & PLAN:   Assessment & Plan by Problem: Principal Problem:   COPD exacerbation (HCC)   Rachel Nolan is a 52 y.o. female with pertinent PMH of COPD, HTN, and substance use disorder who presented with acute chest tightness and dyspnea for a day and is admitted for COPD exacerbation.  COPD exacerbation Pt with recurrent COPD exacerbations for medication non-adherence and cocaine use. More that 4 exacerbations in the past 8 months. No signs of lobar or atypical pneumonia here. Low concern for PE (Wells score of 1) or ACS (stable EKG, no exertional CP, tightness consistent with prior COPD exacerbations). Less concerned for acute heart failure, she does have orthopnea but this is stable and exam/imaging is not consistent with volume overload. Most likely combination of partial medication non-adherence, viral illness, and substance use. She has been able to get off BiPAP before moving to the floor after VBG showed good oxygenation and no CO2 retention.  - dulera, incruse, prn  duoneb, prednisone 40 mg daily, ceftriaxone 1 g daily - wean o2 as tolerated  HTN Blood pressure stable here with elevated diastolic at 100.  She has not been taking her amlodipine 10 mg daily at home.  We will hold antihypertensives for now and monitor her blood pressure.   Concern for Pulmonary HTN and TR on prior CT CT from last admission showed evidence of pulmonary hypertension and tricuspid regurgitation.  With her recurrent COPD exacerbations and orthopnea above there is still some concern for underlying heart failure although I do not think she has any exacerbation.  Could be worthwhile to get an echocardiogram here.  Neuropathy No kidney dysfunction # we will continue gabapentin for 100 mg 3 times daily.  Diet: Normal VTE: DOAC IVF: None Code: DNR  Dispo: Admit patient to Observation with expected length of stay less than 2 midnights.  Signed: Rocky Morel, DO Internal Medicine Resident PGY-2  09/12/2022,  6:27 AM   Dr. Laretta Bolster, MD Pager [phone]

## 2022-09-12 NOTE — ED Triage Notes (Signed)
Patient brought in via EMS, HX of COPD, Per ems patient had audible wheezing, O2 94% RA. Patient tripoding/coughing at this time.

## 2022-09-12 NOTE — ED Provider Notes (Signed)
Rachel Nolan EMERGENCY DEPARTMENT AT [facility] Provider Note   CSN: [id number] Arrival date & time: 09/12/22  0224     History  Chief Complaint  Patient presents with   Respiratory Distress   Level 5 caveat due to acuity of condition Rachel Nolan is a 52 y.o. female.  The history is provided by the patient.   Patient history of COPD, hypertension presents with shortness of breath.  EMS reports on their arrival she had audible wheezing and was 94% on room air.  Patient was coughing and appeared in distress. Patient was given nebulized treatments and steroids. Patient is unable to provide any history on arrival   Past Medical History:  Diagnosis Date   Anxiety    Asthma    COPD (chronic obstructive pulmonary disease) (HCC)    Depression    Hypertension    Scoliosis     Home Medications Prior to Admission medications   Medication Sig Start Date End Date Taking? Authorizing Provider  albuterol (PROVENTIL) (2.5 MG/3ML) 0.083% nebulizer solution Inhale 3 mLs (2.5 mg total) into the lungs every 4 (four) hours. 08/07/22   Morene Crocker, MD  amLODipine (NORVASC) 10 MG tablet Take 1 tablet (10 mg total) by mouth daily. 08/25/22 09/24/22  Rankin, Shuvon B, NP  azithromycin (ZITHROMAX) 250 MG tablet Take 1 tablet (250 mg total) by mouth daily. Take first 2 tablets together, then 1 every day until finished. 08/23/22   Dakermandji, Luther Parody, DO  budesonide-formoterol (SYMBICORT) 80-4.5 MCG/ACT inhaler Inhale 2 puffs into the lungs 2 (two) times daily. 09/06/22   Morene Crocker, MD  gabapentin (NEURONTIN) 400 MG capsule Take 1 capsule (400 mg total) by mouth 3 (three) times daily. 08/31/22 09/30/22  Augusto Gamble, MD  mometasone-formoterol (DULERA) 100-5 MCG/ACT AERO Inhale 2 puffs into the lungs 2 (two) times daily. 08/07/22   Morene Crocker, MD  montelukast (SINGULAIR) 10 MG tablet Take 1 tablet (10 mg total) by mouth at bedtime. 05/26/22   Karie Fetch, MD   nicotine (NICODERM CQ - DOSED IN MG/24 HOURS) 14 mg/24hr patch Place 1 patch (14 mg total) onto the skin daily as needed (nicotine cravings). 08/31/22 09/30/22  Augusto Gamble, MD  nicotine polacrilex (NICORETTE) 2 MG gum Take 1 each (2 mg total) by mouth as needed for smoking cessation. 08/31/22 09/30/22  Augusto Gamble, MD  QUEtiapine (SEROQUEL) 100 MG tablet Take 1 tablet (100 mg total) by mouth at bedtime. 08/07/22 09/06/22  Morene Crocker, MD  citalopram (CELEXA) 20 MG tablet Take 1 tablet (20 mg total) by mouth daily. 03/07/19 07/23/19  Aldean Baker, NP  lisinopril (ZESTRIL) 10 MG tablet Take 1 tablet (10 mg total) by mouth daily. 03/07/19 09/11/19  Aldean Baker, NP  pantoprazole (PROTONIX) 40 MG tablet Take 1 tablet (40 mg total) by mouth daily. 03/07/19 07/23/19  Aldean Baker, NP      Allergies    Patient has no known allergies.    Review of Systems   Review of Systems  Unable to perform ROS: Acuity of condition    Physical Exam Updated Vital Signs BP (!) 120/104   Pulse 92   Temp 97.8 F (36.6 C) (Oral)   Resp (!) 32   LMP 04/19/2018   SpO2 100%  Physical Exam CONSTITUTIONAL: Ill-appearing HEAD: Normocephalic/atraumatic EYES: EOMI/PERRL ENMT: Mucous membranes moist, no angioedema or stridor NECK: supple no meningeal signs CV: S1/S2 noted, no murmurs/rubs/gallops noted LUNGS: Respiratory distress noted, wheezing bilaterally ABDOMEN: soft, nontender NEURO: Pt is awake/alert/appropriate, moves  all extremitiesx4.  No facial droop.   EXTREMITIES: pulses normal/equal, full ROM, no lower extremity edema SKIN: warm, color normal PSYCH: anxious ED Results / Procedures / Treatments   Labs (all labs ordered are listed, but only abnormal results are displayed) Labs Reviewed  CBC - Abnormal; Notable for the following components:      Result Value   RBC 3.69 (*)    Hemoglobin 11.6 (*)    HCT 35.2 (*)    RDW 15.9 (*)    All other components within normal limits  RESP PANEL BY  RT-PCR (RSV, FLU A&B, COVID)  RVPGX2  BASIC METABOLIC PANEL  BASIC METABOLIC PANEL  BLOOD GAS, VENOUS    EKG EKG Interpretation Date/Time:  Sunday September 12 2022 02:39:51 EDT Ventricular Rate:  99 PR Interval:  167 QRS Duration:  87 QT Interval:  371 QTC Calculation: 477 R Axis:   43  Text Interpretation: Sinus rhythm Consider left ventricular hypertrophy No significant change since last tracing Confirmed by Zadie Rhine ([id number]) on 09/12/2022 2:52:24 AM  Radiology DG Chest Port 1 View  Result Date: 09/12/2022 CLINICAL DATA:  Shortness of breath EXAM: PORTABLE CHEST 1 VIEW COMPARISON:  08/23/2018 FINDINGS: Cardiac shadow is stable. Postsurgical changes in the thoracolumbar spine are again seen. No focal infiltrate or effusion is noted. No acute bony abnormality is seen. IMPRESSION: No acute abnormality noted. Electronically Signed   By: Alcide Clever M.D.   On: 09/12/2022 02:59    Procedures .Critical Care  Performed by: Zadie Rhine, MD Authorized by: Zadie Rhine, MD   Critical care provider statement:    Critical care time (minutes):  45   Critical care start time:  09/12/2022 2:30 AM   Critical care end time:  09/12/2022 3:15 AM   Critical care time was exclusive of:  Separately billable procedures and treating other patients   Critical care was necessary to treat or prevent imminent or life-threatening deterioration of the following conditions:  Respiratory failure   Critical care was time spent personally by me on the following activities:  Examination of patient, evaluation of patient's response to treatment, development of treatment plan with patient or surrogate, obtaining history from patient or surrogate, pulse oximetry, ordering and review of radiographic studies, ordering and review of laboratory studies, re-evaluation of patient's condition, ordering and performing treatments and interventions and review of old charts   I assumed direction of critical care for  this patient from another provider in my specialty: no     Care discussed with: admitting provider       Medications Ordered in ED Medications - No data to display  ED Course/ Medical Decision Making/ A&P Clinical Course as of 09/12/22 0425  Sun Sep 12, 2022  0231 Patient arrives in respiratory distress.  She is already received nebulized treatments.  Will place on noninvasive ventilation and will need to be admitted [DW]  0424 Patient is improved.  Mental status is appropriate.  She is responded well to noninvasive ventilation.  Plan for admission.  Discussed with internal medicine resident for admission [DW]    Clinical Course User Index [DW] Zadie Rhine, MD                                 Medical Decision Making Amount and/or Complexity of Data Reviewed Labs: ordered. Radiology: ordered.  Risk Decision regarding hospitalization.   This patient presents to the ED for concern of shortness of  breath, this involves an extensive number of treatment options, and is a complaint that carries with it a high risk of complications and morbidity.  The differential diagnosis includes but is not limited to Acute coronary syndrome, pneumonia, acute pulmonary edema, pneumothorax, acute anemia, pulmonary embolism    Comorbidities that complicate the patient evaluation: Patient's presentation is complicated by their history of COPD  Social Determinants of Health: Patient's  frequent ER evaluations   increases the complexity of managing their presentation  Additional history obtained: Additional history obtained from EMS  Records reviewed previous admission documents  Lab Tests: I Ordered, and personally interpreted labs.  The pertinent results include:  labs unremarkable  Imaging Studies ordered: I ordered imaging studies including X-ray chest   I independently visualized and interpreted imaging which showed no acute findings I agree with the radiologist interpretation  Cardiac  Monitoring: The patient was maintained on a cardiac monitor.  I personally viewed and interpreted the cardiac monitor which showed an underlying rhythm of:  sinus rhythm  Critical Interventions:   noninvasive ventilation for COPD  Consultations Obtained: I requested consultation with the admitting physician internal medicine resident , and discussed  findings as well as pertinent plan - they recommend: Will admit  Reevaluation: After the interventions noted above, I reevaluated the patient and found that they have :improved  Complexity of problems addressed: Patient's presentation is most consistent with  acute presentation with potential threat to life or bodily function  Disposition: After consideration of the diagnostic results and the patient's response to treatment,  I feel that the patent would benefit from admission   .           Final Clinical Impression(s) / ED Diagnoses Final diagnoses:  COPD exacerbation (HCC)  Acute respiratory failure, unspecified whether with hypoxia or hypercapnia [facility])    Rx / DC Orders ED Discharge Orders     None         Zadie Rhine, MD 09/12/22 [phone]

## 2022-09-12 NOTE — Progress Notes (Signed)
Subjective:  Pt seen bedside this AM. States she is still wheezing, but denies any pain. On 6L [place]  Physical Exam:  Constitutional: Older than stated age appearing female, on [place] Cardio: Normal Rate and rhythm  Pulm: Diffuse wheezing heard throughout air fields    Assessment and plan:   COPD Exacerbation:  - Will continue breathing treatments with duonebs, and prednisone therapy as well. Pt will likely need refill of inahlers on discharge. She does not appear volume overloaded on exam, and appears to be close to her baseline of 5L [place].

## 2022-09-12 NOTE — ED Notes (Signed)
Vitals charted at 2:45am were charted wrong. Accidentally clicked wrong patient chart.

## 2022-09-12 NOTE — Hospital Course (Addendum)
This patient lives at a local shelter and spends her days about town. She presented on 8/25 with acute dyspnea and admitted for a COPD exacerbation. Exact cause was undefined but she did not show signs of pneumonia. She was not treated for bacterial infection, as it was not indicated, and viral panel was negative. She did have severe wheezing and chest tightness and so she was treated symptomatically. These progressively improved over the week with scheduled inhalers and nebulizers - duonebs, albuterol, dulera, incruse, mucomyst, as well as prednisone course. She was not hypoxic during the majority of her hospital course and was at her baseline oxygen requirement. She was consistently afebrile. She was held in the hospital for optimal discharge conditions due to her social situation. On day of discharge, her dyspnea, wheezing, and chest tightness had fully resolved.  COPD exacerbation No signs of pneumonia, not hypoxic during admission, but severe wheezing and subjective dyspnea. Likely cause was a combination of partial medication non-adherence, URI, and substance use (continued smoking and cocaine). Treatment directed primarily to relieve dyspnea and wheezing. PE , ACS, heart failure ruled out. History of recurrent COPD exacerbations for medication non-adherence and cocaine use. More that 4 exacerbations in the past 8 months. She completed a full 5-day course of oral steroids while here. She will be discharged without antibiotics.  HTN Blood pressure stable during hospitalization on her home amlodipine.    Case manager provided Ms Elfers with contact information for a disability attorney.

## 2022-09-13 DIAGNOSIS — I10 Essential (primary) hypertension: Secondary | ICD-10-CM

## 2022-09-13 DIAGNOSIS — Z7951 Long term (current) use of inhaled steroids: Secondary | ICD-10-CM | POA: Diagnosis not present

## 2022-09-13 DIAGNOSIS — Z5901 Sheltered homelessness: Secondary | ICD-10-CM | POA: Diagnosis not present

## 2022-09-13 DIAGNOSIS — Z66 Do not resuscitate: Secondary | ICD-10-CM | POA: Diagnosis present

## 2022-09-13 DIAGNOSIS — F1290 Cannabis use, unspecified, uncomplicated: Secondary | ICD-10-CM | POA: Diagnosis present

## 2022-09-13 DIAGNOSIS — F1910 Other psychoactive substance abuse, uncomplicated: Secondary | ICD-10-CM | POA: Diagnosis not present

## 2022-09-13 DIAGNOSIS — J9621 Acute and chronic respiratory failure with hypoxia: Secondary | ICD-10-CM | POA: Diagnosis present

## 2022-09-13 DIAGNOSIS — Z79899 Other long term (current) drug therapy: Secondary | ICD-10-CM | POA: Diagnosis not present

## 2022-09-13 DIAGNOSIS — J441 Chronic obstructive pulmonary disease with (acute) exacerbation: Secondary | ICD-10-CM | POA: Diagnosis present

## 2022-09-13 DIAGNOSIS — F32A Depression, unspecified: Secondary | ICD-10-CM | POA: Diagnosis present

## 2022-09-13 DIAGNOSIS — H409 Unspecified glaucoma: Secondary | ICD-10-CM | POA: Diagnosis present

## 2022-09-13 DIAGNOSIS — F419 Anxiety disorder, unspecified: Secondary | ICD-10-CM | POA: Diagnosis present

## 2022-09-13 DIAGNOSIS — F17210 Nicotine dependence, cigarettes, uncomplicated: Secondary | ICD-10-CM | POA: Diagnosis present

## 2022-09-13 DIAGNOSIS — Z8249 Family history of ischemic heart disease and other diseases of the circulatory system: Secondary | ICD-10-CM | POA: Diagnosis not present

## 2022-09-13 DIAGNOSIS — E876 Hypokalemia: Secondary | ICD-10-CM | POA: Diagnosis present

## 2022-09-13 DIAGNOSIS — J9600 Acute respiratory failure, unspecified whether with hypoxia or hypercapnia: Secondary | ICD-10-CM | POA: Diagnosis not present

## 2022-09-13 DIAGNOSIS — Z6833 Body mass index (BMI) 33.0-33.9, adult: Secondary | ICD-10-CM | POA: Diagnosis not present

## 2022-09-13 DIAGNOSIS — Z91148 Patient's other noncompliance with medication regimen for other reason: Secondary | ICD-10-CM | POA: Diagnosis not present

## 2022-09-13 DIAGNOSIS — Z1152 Encounter for screening for COVID-19: Secondary | ICD-10-CM | POA: Diagnosis not present

## 2022-09-13 DIAGNOSIS — F1490 Cocaine use, unspecified, uncomplicated: Secondary | ICD-10-CM | POA: Diagnosis present

## 2022-09-13 DIAGNOSIS — G629 Polyneuropathy, unspecified: Secondary | ICD-10-CM | POA: Diagnosis present

## 2022-09-13 DIAGNOSIS — E669 Obesity, unspecified: Secondary | ICD-10-CM | POA: Diagnosis present

## 2022-09-13 MED ORDER — NAPROXEN 250 MG PO TABS
500.0000 mg | ORAL_TABLET | Freq: Once | ORAL | Status: AC
  Administered 2022-09-13: 500 mg via ORAL
  Filled 2022-09-13: qty 2

## 2022-09-13 MED ORDER — IPRATROPIUM-ALBUTEROL 0.5-2.5 (3) MG/3ML IN SOLN
3.0000 mL | Freq: Four times a day (QID) | RESPIRATORY_TRACT | Status: AC
  Administered 2022-09-13 (×2): 3 mL via RESPIRATORY_TRACT
  Filled 2022-09-13 (×2): qty 3

## 2022-09-13 NOTE — Progress Notes (Signed)
Evaluated patient at bedside for R face pain. She reports a history of glaucoma and notes stable reduced vision but also pain that shoots along skin from R eye area to ear. This pain has happened many times before over the years lasting for a few days but is at its worst now. Denies new changes in vision. She had mild improvement from eye drops and tylenol. Exam including neuro is unremarkable. No fever, dizziness, muscle pain, jaw pain, lacrimation, ocular changes. Not currently concerned for stroke, temporal arteritis. Will add NSAID for pain management in addition to her tylenol.
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
## 2022-09-13 NOTE — Plan of Care (Signed)
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
## 2022-09-14 DIAGNOSIS — J9600 Acute respiratory failure, unspecified whether with hypoxia or hypercapnia: Secondary | ICD-10-CM | POA: Diagnosis not present

## 2022-09-14 DIAGNOSIS — J441 Chronic obstructive pulmonary disease with (acute) exacerbation: Secondary | ICD-10-CM | POA: Diagnosis not present

## 2022-09-14 DIAGNOSIS — F17210 Nicotine dependence, cigarettes, uncomplicated: Secondary | ICD-10-CM | POA: Diagnosis not present

## 2022-09-14 DIAGNOSIS — I10 Essential (primary) hypertension: Secondary | ICD-10-CM | POA: Diagnosis not present

## 2022-09-14 MED ORDER — ACETYLCYSTEINE 20 % IN SOLN
1.0000 mL | Freq: Three times a day (TID) | RESPIRATORY_TRACT | Status: DC
  Administered 2022-09-14 – 2022-09-15 (×3): 1 mL via RESPIRATORY_TRACT
  Filled 2022-09-14 (×4): qty 4

## 2022-09-14 MED ORDER — AMLODIPINE BESYLATE 10 MG PO TABS
10.0000 mg | ORAL_TABLET | Freq: Every day | ORAL | Status: DC
  Administered 2022-09-14 – 2022-09-17 (×4): 10 mg via ORAL
  Filled 2022-09-14 (×4): qty 1

## 2022-09-14 MED ORDER — GUAIFENESIN ER 600 MG PO TB12
600.0000 mg | ORAL_TABLET | Freq: Two times a day (BID) | ORAL | Status: AC
  Administered 2022-09-14 – 2022-09-16 (×6): 600 mg via ORAL
  Filled 2022-09-14 (×6): qty 1

## 2022-09-14 MED ORDER — ACETYLCYSTEINE 20 % IN SOLN
1.0000 mL | Freq: Three times a day (TID) | RESPIRATORY_TRACT | Status: DC
  Filled 2022-09-14: qty 4

## 2022-09-14 MED ORDER — ACETYLCYSTEINE 10% NICU INHALATION SOLUTION
2.0000 mL | Freq: Three times a day (TID) | RESPIRATORY_TRACT | Status: DC
  Filled 2022-09-14 (×3): qty 2

## 2022-09-14 MED ORDER — ALBUTEROL SULFATE (2.5 MG/3ML) 0.083% IN NEBU
2.5000 mg | INHALATION_SOLUTION | RESPIRATORY_TRACT | Status: DC | PRN
  Administered 2022-09-16: 2.5 mg via RESPIRATORY_TRACT
  Filled 2022-09-14: qty 3

## 2022-09-14 MED ORDER — QUETIAPINE FUMARATE 100 MG PO TABS
100.0000 mg | ORAL_TABLET | Freq: Every day | ORAL | Status: DC
  Administered 2022-09-14 – 2022-09-16 (×3): 100 mg via ORAL
  Filled 2022-09-14 (×3): qty 1

## 2022-09-14 MED ORDER — IPRATROPIUM-ALBUTEROL 0.5-2.5 (3) MG/3ML IN SOLN
3.0000 mL | Freq: Four times a day (QID) | RESPIRATORY_TRACT | Status: DC
  Administered 2022-09-14 – 2022-09-16 (×7): 3 mL via RESPIRATORY_TRACT
  Filled 2022-09-14 (×7): qty 3

## 2022-09-14 NOTE — TOC Progression Note (Addendum)
Transition of Care [facility]) - Progression Note    Patient Details  Name: Rachel Nolan MRN: [id number] Date of Birth: 09-15-1970  Transition of Care [facility]) CM/SW Contact  Janae Bridgeman, RN Phone Number: 09/14/2022, 4:03 PM  Clinical Narrative:    CM met with the patient at the bedside and the patient was provided with clothing.    [employer] delivered a nebulizer machine and oxygen tank to the bedside.  The patient was unaware of which DME company was providing her oxygen most recently.  09/14/22 1600 - Message sent a message to  the attending team to determine when the patient would be stable to discharge back to the shelter.  Patient has a Rolator in the hospital room and will need transportation assistance through taxi back to the shelter when stable.  I spoke with Dr. Geraldo Pitter, DO and confirmed that patient would remain in patient for 1-2 days and discharge back to the [facility] by taxi once medically stable.        Expected Discharge Plan and Services                                               Social Determinants of Health (SDOH) Interventions SDOH Screenings   Food Insecurity: Food Insecurity Present (09/12/2022)  Housing: High Risk (09/12/2022)  Transportation Needs: Unmet Transportation Needs (09/12/2022)  Utilities: Not At Risk (09/12/2022)  Recent Concern: Utilities - At Risk (08/05/2022)  Alcohol Screen: Low Risk  (08/25/2022)  Depression (PHQ2-9): High Risk (08/28/2022)  Financial Resource Strain: Low Risk  (05/12/2022)   Received from [facility], [facility]  Physical Activity: Inactive (05/12/2022)   Received from [facility], [facility]  Social Connections: Moderately Integrated (05/12/2022)   Received from [facility], [facility]  Stress: Stress Concern Present (05/12/2022)   Received from [facility], [facility]  Tobacco Use: High Risk (09/12/2022)  Health Literacy: Inadequate Health Literacy (05/12/2022)   Received from [facility], [facility]    Readmission Risk Interventions    09/13/2022    4:41 PM 09/22/2021    5:06 PM 05/19/2021   10:10 AM  Readmission Risk Prevention Plan  Transportation Screening Complete  Complete  Medication Review (RN Care Manager) Complete  Complete  PCP or Specialist appointment within 3-5 days of discharge Complete    PCP/Specialist Appt Not Complete comments   first apt is 5/24  HRI or Home Care Consult Complete  Complete  SW Recovery Care/Counseling Consult Complete  Complete  Palliative Care Screening Not Applicable  Not Applicable  Skilled Nursing Facility Not Applicable  Not Applicable     Information is confidential and restricted. Go to Review Flowsheets to unlock data.
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
## 2022-09-14 NOTE — Progress Notes (Signed)
    Durable Medical Equipment  (From admission, onward)           Start     Ordered   09/14/22 0857  For home use only DME Nebulizer machine  Once       Question Answer Comment  Patient needs a nebulizer to treat with the following condition COPD (chronic obstructive pulmonary disease) (HCC)   Length of Need 12 Months      09/14/22 0857

## 2022-09-15 ENCOUNTER — Encounter: Payer: MEDICAID | Admitting: Internal Medicine

## 2022-09-15 DIAGNOSIS — J441 Chronic obstructive pulmonary disease with (acute) exacerbation: Secondary | ICD-10-CM | POA: Diagnosis not present

## 2022-09-15 DIAGNOSIS — F17210 Nicotine dependence, cigarettes, uncomplicated: Secondary | ICD-10-CM | POA: Diagnosis not present

## 2022-09-15 NOTE — Plan of Care (Signed)
  Problem: Education: Goal: Knowledge of General Education information will improve Description: Including pain rating scale, medication(s)/side effects and non-pharmacologic comfort measures Outcome: Progressing   Problem: Health Behavior/Discharge Planning: Goal: Ability to manage health-related needs will improve Outcome: Progressing   Problem: Clinical Measurements: Goal: Will remain free from infection Outcome: Progressing   Problem: Activity: Goal: Risk for activity intolerance will decrease Outcome: Progressing   Problem: Coping: Goal: Level of anxiety will decrease Outcome: Progressing   Problem: Pain Managment: Goal: General experience of comfort will improve Outcome: Progressing   

## 2022-09-15 NOTE — Progress Notes (Signed)
We (Dr. Lily Kocher and I) spoke with the patient regarding code status. Patient consented to code status change from DNR to FULL code. She understands what is involved in a full code and would like to receive CPR and Intubation if she were to code.   Code status changed from DNR to FULL.
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
## 2022-09-15 NOTE — Progress Notes (Signed)
  Progress Note   Date: 09/14/2022  Patient Name: Rachel Nolan        MRN#: [id number]  Review the patient's clinical findings supports the diagnosis of:   Hypokalemia

## 2022-09-15 NOTE — Progress Notes (Signed)
  Progress Note   Date: 09/14/2022  Patient Name: Rachel Nolan        MRN#: [id number]  Review the patient's clinical findings supports the diagnosis of:   Obesity   with BMI of 33

## 2022-09-16 DIAGNOSIS — J441 Chronic obstructive pulmonary disease with (acute) exacerbation: Secondary | ICD-10-CM | POA: Diagnosis not present

## 2022-09-16 DIAGNOSIS — J9621 Acute and chronic respiratory failure with hypoxia: Secondary | ICD-10-CM | POA: Diagnosis not present

## 2022-09-16 DIAGNOSIS — F17210 Nicotine dependence, cigarettes, uncomplicated: Secondary | ICD-10-CM | POA: Diagnosis not present

## 2022-09-16 MED ORDER — ACETYLCYSTEINE 20 % IN SOLN
4.0000 mL | Freq: Once | RESPIRATORY_TRACT | Status: AC
  Administered 2022-09-16: 4 mL via RESPIRATORY_TRACT
  Filled 2022-09-16: qty 4

## 2022-09-16 MED ORDER — IPRATROPIUM-ALBUTEROL 0.5-2.5 (3) MG/3ML IN SOLN
3.0000 mL | RESPIRATORY_TRACT | Status: DC
  Administered 2022-09-16 – 2022-09-17 (×4): 3 mL via RESPIRATORY_TRACT
  Filled 2022-09-16 (×6): qty 3

## 2022-09-16 MED ORDER — ACETYLCYSTEINE 20 % IN SOLN
4.0000 mL | Freq: Four times a day (QID) | RESPIRATORY_TRACT | Status: DC | PRN
  Filled 2022-09-16: qty 4

## 2022-09-16 MED ORDER — SODIUM CHLORIDE 0.9 % IV SOLN
8.0000 mg | Freq: Once | INTRAVENOUS | Status: DC
  Filled 2022-09-16: qty 4

## 2022-09-16 MED ORDER — ONDANSETRON 4 MG PO TBDP
8.0000 mg | ORAL_TABLET | Freq: Once | ORAL | Status: AC
  Administered 2022-09-16: 8 mg via ORAL
  Filled 2022-09-16: qty 2

## 2022-09-16 MED ORDER — MENTHOL 3 MG MT LOZG: 1.0000 | LOZENGE | OROMUCOSAL | Status: DC | PRN

## 2022-09-16 NOTE — Progress Notes (Signed)
Pt BP elevated at this time. Pt asymptomatic. MD notified. Told to continue to monitor. Nursing plan of care ongoing.

## 2022-09-16 NOTE — TOC Transition Note (Signed)
Transition of Care [facility]) - CM/SW Discharge Note   Patient Details  Name: Rachel Nolan MRN: [id number] Date of Birth: April 30, 1970  Transition of [facility]) CM/SW Contact:  Janae Bridgeman, RN Phone Number: 09/16/2022, 11:20 AM   Clinical Narrative:    CM met with the patient at the bedside to discuss TOC needs for discharge back to [facility] tomorrow.  The patient was provided with [employer] [place] to contact to assist with disability process since patient was recently declined.  Patient has portable oxygen tank and nebulizer machine in the room provided by [employer].  Patient states that she will have transportation back to the [facility] tomorrow by a friend.         Patient Goals and CMS Choice      Discharge Placement                         Discharge Plan and Services Additional resources added to the After Visit Summary for                                       Social Determinants of Health (SDOH) Interventions SDOH Screenings   Food Insecurity: Food Insecurity Present (09/12/2022)  Housing: High Risk (09/12/2022)  Transportation Needs: Unmet Transportation Needs (09/12/2022)  Utilities: Not At Risk (09/12/2022)  Recent Concern: Utilities - At Risk (08/05/2022)  Alcohol Screen: Low Risk  (08/25/2022)  Depression (PHQ2-9): High Risk (08/28/2022)  Financial Resource Strain: Low Risk  (05/12/2022)   Received from [facility], [facility]  Physical Activity: Inactive (05/12/2022)   Received from [facility], [facility]  Social Connections: Moderately Integrated (05/12/2022)   Received from [facility], [facility]  Stress: Stress Concern Present (05/12/2022)   Received from [facility], [facility]  Tobacco Use: High Risk (09/12/2022)  Health Literacy: Inadequate Health Literacy (05/12/2022)   Received from [facility], [facility]     Readmission Risk Interventions    09/13/2022    4:41 PM 09/22/2021    5:06 PM 05/19/2021   10:10  AM  Readmission Risk Prevention Plan  Transportation Screening Complete  Complete  Medication Review (RN Care Manager) Complete  Complete  PCP or Specialist appointment within 3-5 days of discharge Complete    PCP/Specialist Appt Not Complete comments   first apt is 5/24  HRI or Home Care Consult Complete  Complete  SW Recovery Care/Counseling Consult Complete  Complete  Palliative Care Screening Not Applicable  Not Applicable  Skilled Nursing Facility Not Applicable  Not Applicable     Information is confidential and restricted. Go to Review Flowsheets to unlock data.

## 2022-09-16 NOTE — Progress Notes (Signed)
SPO2 at rest on RA 96%, HR 108  SPO2 RA , ambulatory 96%, HR 127 SPO2 with 1 L O2, 100%, HR 107

## 2022-09-16 NOTE — Plan of Care (Signed)
  Problem: Education: Goal: Knowledge of General Education information will improve Description: Including pain rating scale, medication(s)/side effects and non-pharmacologic comfort measures Outcome: Progressing   Problem: Health Behavior/Discharge Planning: Goal: Ability to manage health-related needs will improve Outcome: Progressing   Problem: Clinical Measurements: Goal: Will remain free from infection Outcome: Progressing   Problem: Activity: Goal: Risk for activity intolerance will decrease Outcome: Progressing   Problem: Coping: Goal: Level of anxiety will decrease Outcome: Progressing   Problem: Pain Managment: Goal: General experience of comfort will improve Outcome: Progressing   Problem: Safety: Goal: Ability to remain free from injury will improve Outcome: Progressing   Problem: Skin Integrity: Goal: Risk for impaired skin integrity will decrease Outcome: Progressing   
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
## 2022-09-17 ENCOUNTER — Telehealth (HOSPITAL_COMMUNITY): Payer: Self-pay | Admitting: Pharmacy Technician

## 2022-09-17 ENCOUNTER — Other Ambulatory Visit (HOSPITAL_COMMUNITY): Payer: Self-pay

## 2022-09-17 DIAGNOSIS — F1910 Other psychoactive substance abuse, uncomplicated: Secondary | ICD-10-CM

## 2022-09-17 DIAGNOSIS — J441 Chronic obstructive pulmonary disease with (acute) exacerbation: Secondary | ICD-10-CM | POA: Diagnosis not present

## 2022-09-17 DIAGNOSIS — I10 Essential (primary) hypertension: Secondary | ICD-10-CM | POA: Diagnosis not present

## 2022-09-17 DIAGNOSIS — J9621 Acute and chronic respiratory failure with hypoxia: Secondary | ICD-10-CM | POA: Diagnosis not present

## 2022-09-17 MED ORDER — TRELEGY ELLIPTA 100-62.5-25 MCG/ACT IN AEPB
1.0000 | INHALATION_SPRAY | Freq: Every day | RESPIRATORY_TRACT | 3 refills | Status: AC
Start: 2022-09-17 — End: ?
  Filled 2022-09-17 (×2): qty 60, 30d supply, fill #0

## 2022-09-17 MED ORDER — MOMETASONE FURO-FORMOTEROL FUM 100-5 MCG/ACT IN AERO
2.0000 | INHALATION_SPRAY | Freq: Two times a day (BID) | RESPIRATORY_TRACT | 5 refills | Status: AC
  Filled 2022-09-17: qty 13, 30d supply, fill #0

## 2022-09-17 MED ORDER — MONTELUKAST SODIUM 10 MG PO TABS
10.0000 mg | ORAL_TABLET | Freq: Every day | ORAL | 3 refills | Status: AC
Start: 2022-09-17 — End: ?
  Filled 2022-09-17: qty 30, 30d supply, fill #0

## 2022-09-17 MED ORDER — ALBUTEROL SULFATE (2.5 MG/3ML) 0.083% IN NEBU
2.5000 mg | INHALATION_SOLUTION | RESPIRATORY_TRACT | 3 refills | Status: AC
  Filled 2022-09-17: qty 90, 5d supply, fill #0

## 2022-09-17 MED ORDER — ALBUTEROL SULFATE (2.5 MG/3ML) 0.083% IN NEBU: 2.5000 mg | INHALATION_SOLUTION | RESPIRATORY_TRACT | 11 refills | Status: DC

## 2022-09-17 NOTE — Telephone Encounter (Signed)
Clinical Questions have been Submitted
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
## 2022-09-17 NOTE — Discharge Instructions (Signed)
Rachel Nolan, It was a pleasure taking care of you. You were admitted for an exacerbation of your COPD. We will send you home with a new inhaler called Trelegy. Take this once per day with a slow and very full deep breath. Remember to do your best to avoid things that irritate your lungs, including strong scents and smoke. Return to the hospital if you have trouble breathing. You have a follow-up scheduled at the [facility] on the ground floor of the hospital on Sept 13 at 9:45. Please attend that appointment because it is very important to see how you have been and make necessary changes to keep you out of the hospital. Best regards, Dr. Ninfa Meeker

## 2022-09-17 NOTE — Telephone Encounter (Signed)
Pharmacy Patient Advocate Encounter   Received notification that prior authorization for Trelegy Ellipta 100-62.5-25MCG/ACT aerosol powder is required/requested.   Insurance verification completed.   The patient is insured through [facility] .   Per test claim: PA required; PA started via CoverMyMeds. KEY [id number] . Waiting for clinical questions to populate.

## 2022-09-17 NOTE — Plan of Care (Signed)

## 2022-09-17 NOTE — Discharge Summary (Addendum)
Name: Rachel Nolan MRN: [id number] DOB: Dec 08, 1970 52 y.o. PCP: Morene Crocker, MD  Date of Admission: 09/12/2022  2:24 AM Date of Discharge:  09/17/22 Attending Physician: Dr. Cleda Daub  DISCHARGE DIAGNOSIS:  Primary Problem: COPD exacerbation [facility])   Hospital Problems: Principal Problem:   COPD exacerbation (HCC) Active Problems:   Essential hypertension   Polysubstance abuse (HCC)   Acute on chronic hypoxic respiratory failure (HCC)   DISCHARGE MEDICATIONS:   Allergies as of 09/17/2022   No Known Allergies      Medication List     STOP taking these medications    azithromycin 250 MG tablet Commonly known as: ZITHROMAX   budesonide-formoterol 80-4.5 MCG/ACT inhaler Commonly known as: SYMBICORT       TAKE these medications    albuterol (2.5 MG/3ML) 0.083% nebulizer solution Commonly known as: PROVENTIL Inhale 3 mLs (2.5 mg total) into the lungs every 4 (four) hours.   amLODipine 10 MG tablet Commonly known as: NORVASC Take 1 tablet (10 mg total) by mouth daily.   Dulera 100-5 MCG/ACT Aero Generic drug: mometasone-formoterol Inhale 2 puffs into the lungs 2 (two) times daily.   gabapentin 400 MG capsule Commonly known as: NEURONTIN Take 1 capsule (400 mg total) by mouth 3 (three) times daily.   montelukast 10 MG tablet Commonly known as: SINGULAIR Take 1 tablet (10 mg total) by mouth at bedtime.   nicotine 14 mg/24hr patch Commonly known as: NICODERM CQ - dosed in mg/24 hours Place 1 patch (14 mg total) onto the skin daily as needed (nicotine cravings).   nicotine polacrilex 2 MG gum Commonly known as: NICORETTE Take 1 each (2 mg total) by mouth as needed for smoking cessation.   QUEtiapine 100 MG tablet Commonly known as: SEROquel Take 1 tablet (100 mg total) by mouth at bedtime.   Trelegy Ellipta 100-62.5-25 MCG/ACT Aepb Generic drug: Fluticasone-Umeclidin-Vilant Inhale 1 puff into the lungs daily. Be sure to take a slow and  deep breath.               Durable Medical Equipment  (From admission, onward)           Start     Ordered   09/14/22 1025  For home use only DME oxygen  Once       Question Answer Comment  Length of Need 12 Months   Mode or (Route) Nasal cannula   Liters per Minute 4   Frequency Continuous (stationary and portable oxygen unit needed)   Oxygen delivery system Gas      09/14/22 1024   09/14/22 0857  For home use only DME Nebulizer machine  Once       Question Answer Comment  Patient needs a nebulizer to treat with the following condition COPD (chronic obstructive pulmonary disease) (HCC)   Length of Need 12 Months      09/14/22 0857            DISPOSITION AND FOLLOW-UP:  [name] was discharged from [facility] in Stable condition. At the hospital follow up visit please address:  Follow-up Recommendations: Labs:  None, as indicated Studies: n/a Medications: Inhaler use - we will discharge her on her home Dulera. Will attempt to provide Trelegy at an outside pharmacy pending prior auth, unclear at this time if she will get it.  At follow up, please consider review of her respiratory maintenance therapy as per above and her overall symptom burden since discharge.  Follow-up Appointments:  Follow-up Information  Morene Crocker, MD. Schedule an appointment as soon as possible for a visit.   Specialty: Internal Medicine Why: Please call the office and schedule a hospital follow up in the next 7-10 days. Contact information: [address] [place] [address] [phone]                 HOSPITAL COURSE:  Patient Summary: This patient lives at a local shelter and spends her days about town. She presented on 8/25 with acute dyspnea and admitted for a COPD exacerbation. Exact cause was undefined but she did not show signs of pneumonia. She was not treated for bacterial infection, as it was not indicated, and viral  panel was negative. She did have severe wheezing and chest tightness and so she was treated symptomatically. These progressively improved over the week with scheduled inhalers and nebulizers - duonebs, albuterol, dulera, incruse, mucomyst, as well as prednisone course. She was not hypoxic during the majority of her hospital course and was at her baseline oxygen requirement. She was consistently afebrile. She was held in the hospital for optimal discharge conditions due to her social situation. On day of discharge, her dyspnea, wheezing, and chest tightness had fully resolved.  COPD exacerbation Acute on Chronic Hypoxic respiratory failure No signs of pneumonia, not hypoxic during admission, but severe wheezing and subjective dyspnea. Likely cause was a combination of partial medication non-adherence, URI, and substance use (continued smoking and cocaine). Treatment directed primarily to relieve dyspnea and wheezing. PE , ACS, heart failure ruled out. History of recurrent COPD exacerbations for medication non-adherence and cocaine use. More that 4 exacerbations in the past 8 months. She completed a full 5-day course of oral steroids while here. She will be discharged without antibiotics.  HTN Blood pressure stable during hospitalization on her home amlodipine.    Case manager provided [name] with contact information for a disability attorney.    DISCHARGE INSTRUCTIONS:   Discharge Instructions     Call MD for:  difficulty breathing, headache or visual disturbances   Complete by: As directed    Call MD for:  extreme fatigue   Complete by: As directed    Call MD for:  persistant dizziness or light-headedness   Complete by: As directed    Call MD for:  persistant nausea and vomiting   Complete by: As directed    Call MD for:  redness, tenderness, or signs of infection (pain, swelling, redness, odor or green/yellow discharge around incision site)   Complete by: As directed    Call MD for:   severe uncontrolled pain   Complete by: As directed    Call MD for:  temperature >100.4   Complete by: As directed    Diet general   Complete by: As directed    Increase activity slowly   Complete by: As directed        SUBJECTIVE:  Pt is very well this morning. Her wheezing and chest tightness has resolved. She feels very strong to leave the hospital. She has no concerns or complaints.  Discharge Vitals:   BP 139/88 (BP Location: Right Arm)   Pulse 76   Temp 97.8 F (36.6 C) (Oral)   Resp 15   Ht 5\' 6"  (1.676 m)   Wt 99.5 kg   LMP 04/19/2018   SpO2 100%   BMI 35.41 kg/m   OBJECTIVE:  Physical Exam Constitutional:      General: She is not in acute distress.    Appearance: Normal appearance.  She is not ill-appearing.  Cardiovascular:     Rate and Rhythm: Normal rate and regular rhythm.     Pulses: Normal pulses.     Heart sounds: Normal heart sounds.  Pulmonary:     Effort: Pulmonary effort is normal. No respiratory distress.     Breath sounds: Normal breath sounds. No wheezing.  Abdominal:     General: Abdomen is flat. Bowel sounds are normal.     Palpations: Abdomen is soft.  Musculoskeletal:     Right lower leg: No edema.     Left lower leg: No edema.  Skin:    Capillary Refill: Capillary refill takes less than 2 seconds.  Neurological:     General: No focal deficit present.     Mental Status: She is alert and oriented to person, place, and time.  Psychiatric:        Mood and Affect: Mood normal.        Behavior: Behavior normal.      Pertinent Labs, Studies, and Procedures:     Latest Ref Rng & Units 09/12/2022    5:33 AM 09/12/2022    3:03 AM 08/23/2022    1:52 PM  CBC  WBC 4.0 - 10.5 K/uL  4.2    Hemoglobin 12.0 - 15.0 g/dL 56.2  13.0  86.5   Hematocrit 36.0 - 46.0 % 35.0  35.2  38.0   Platelets 150 - 400 K/uL  189         Latest Ref Rng & Units 09/12/2022    9:14 AM 09/12/2022    5:33 AM 09/12/2022    4:07 AM  CMP  Glucose 70 - 99 mg/dL 784    696   BUN 6 - 20 mg/dL 11   10   Creatinine 2.95 - 1.00 mg/dL 2.84   1.32   Sodium 440 - 145 mmol/L 138  140  136   Potassium 3.5 - 5.1 mmol/L 3.6  3.6  3.3   Chloride 98 - 111 mmol/L 104   108   CO2 22 - 32 mmol/L 24   21   Calcium 8.9 - 10.3 mg/dL 8.9   8.0     DG Chest Port 1 View  Result Date: 09/12/2022 CLINICAL DATA:  Shortness of breath EXAM: PORTABLE CHEST 1 VIEW COMPARISON:  08/23/2018 FINDINGS: Cardiac shadow is stable. Postsurgical changes in the thoracolumbar spine are again seen. No focal infiltrate or effusion is noted. No acute bony abnormality is seen. IMPRESSION: No acute abnormality noted. Electronically Signed   By: Alcide Clever M.D.   On: 09/12/2022 02:59     Signed: Katheran James, DO Internal Medicine Resident, PGY-1 [name]  Pager: [phone] 1:09 PM, 09/17/2022

## 2022-09-17 NOTE — Telephone Encounter (Signed)
Pharmacy Patient Advocate Encounter  Received notification from [facility] that Prior Authorization for Trelegy Ellipta 100-62.5-25MCG/ACT aerosol powder  has been APPROVED from 09/17/2022 to 09/17/2023   PA #/Case ID/Reference #: [id number]

## 2022-09-17 NOTE — Progress Notes (Addendum)
Pt is noncompliant with wearing oxygen. Pt was educated on the importance of supplemental O2 therapy. Pt still refuses to use supplemental O2 at this time. Nursing plan of care ongoing

## 2022-09-20 ENCOUNTER — Telehealth: Payer: Self-pay

## 2022-09-20 NOTE — Transitions of Care (Post Inpatient/ED Visit) (Signed)
   09/20/2022  Name: Rachel Nolan MRN: [id number] DOB: 12-Feb-1970  Today's TOC FU Call Status: Today's TOC FU Call Status:: Unsuccessful Call (1st Attempt) Unsuccessful Call (1st Attempt) Date: 09/20/22  Attempted to reach the patient regarding the most recent Inpatient/ED visit.  Follow Up Plan: Additional outreach attempts will be made to reach the patient to complete the Transitions of Care (Post Inpatient/ED visit) call.   Signature Karena Addison, LPN [facility] Nurse Health Advisor Direct Dial [phone]

## 2022-09-27 NOTE — Transitions of Care (Post Inpatient/ED Visit) (Unsigned)
   09/27/2022  Name: Rachel Nolan MRN: [id number] DOB: 04/02/1970  Today's TOC FU Call Status: Today's TOC FU Call Status:: Unsuccessful Call (2nd Attempt) Unsuccessful Call (1st Attempt) Date: 09/20/22 Unsuccessful Call (2nd Attempt) Date: 09/27/22  Attempted to reach the patient regarding the most recent Inpatient/ED visit.  Follow Up Plan: Additional outreach attempts will be made to reach the patient to complete the Transitions of Care (Post Inpatient/ED visit) call.   Signature Karena Addison, LPN [facility] Nurse Health Advisor Direct Dial [phone]

## 2022-09-28 NOTE — Transitions of Care (Post Inpatient/ED Visit) (Signed)
   09/28/2022  Name: Rachel Nolan MRN: [id number] DOB: [birth year]  Today's TOC FU Call Status: Today's TOC FU Call Status:: Unsuccessful Call (3rd Attempt) Unsuccessful Call (1st Attempt) Date: 09/20/22 Unsuccessful Call (2nd Attempt) Date: 09/27/22 Unsuccessful Call (3rd Attempt) Date: 09/28/22  Attempted to reach the patient regarding the most recent Inpatient/ED visit.  Follow Up Plan: No further outreach attempts will be made at this time. We have been unable to contact the patient.  Signature Karena Addison, LPN [facility] Nurse Health Advisor Direct Dial [phone]

## 2022-10-01 ENCOUNTER — Encounter: Payer: MEDICAID | Admitting: Internal Medicine

## 2022-10-21 ENCOUNTER — Other Ambulatory Visit: Payer: Self-pay

## 2022-10-25 ENCOUNTER — Other Ambulatory Visit (HOSPITAL_COMMUNITY): Payer: Self-pay

## 2022-11-24 ENCOUNTER — Ambulatory Visit (HOSPITAL_COMMUNITY): Payer: 59 | Admitting: Psychiatry

## 2023-05-05 ENCOUNTER — Other Ambulatory Visit (HOSPITAL_COMMUNITY): Payer: Self-pay

## 2023-06-21 ENCOUNTER — Other Ambulatory Visit: Payer: Self-pay

## 2023-06-27 ENCOUNTER — Other Ambulatory Visit (HOSPITAL_COMMUNITY): Payer: Self-pay

## 2023-07-28 ENCOUNTER — Other Ambulatory Visit (HOSPITAL_COMMUNITY): Payer: Self-pay

## 2023-11-03 ENCOUNTER — Other Ambulatory Visit: Payer: Self-pay

## 2024-02-23 ENCOUNTER — Other Ambulatory Visit (HOSPITAL_COMMUNITY): Payer: Self-pay
